# Patient Record
Sex: Female | Born: 1935 | Race: White | Hispanic: No | Smoking: Never smoker
Health system: Southern US, Community
[De-identification: ages and names within clinical notes are randomized; demographics above are authoritative.]

## PROBLEM LIST (undated history)

## (undated) DIAGNOSIS — D469 Myelodysplastic syndrome, unspecified: Secondary | ICD-10-CM

## (undated) DIAGNOSIS — C44529 Squamous cell carcinoma of skin of other part of trunk: Secondary | ICD-10-CM

## (undated) DIAGNOSIS — K623 Rectal prolapse: Secondary | ICD-10-CM

## (undated) DIAGNOSIS — I10 Essential (primary) hypertension: Secondary | ICD-10-CM

## (undated) DIAGNOSIS — D649 Anemia, unspecified: Secondary | ICD-10-CM

## (undated) DIAGNOSIS — R2681 Unsteadiness on feet: Secondary | ICD-10-CM

## (undated) DIAGNOSIS — M25519 Pain in unspecified shoulder: Secondary | ICD-10-CM

## (undated) DIAGNOSIS — E039 Hypothyroidism, unspecified: Secondary | ICD-10-CM

## (undated) DIAGNOSIS — B351 Tinea unguium: Secondary | ICD-10-CM

## (undated) DIAGNOSIS — C439 Malignant melanoma of skin, unspecified: Secondary | ICD-10-CM

## (undated) DIAGNOSIS — C4330 Malignant melanoma of unspecified part of face: Secondary | ICD-10-CM

## (undated) DIAGNOSIS — E785 Hyperlipidemia, unspecified: Secondary | ICD-10-CM

## (undated) DIAGNOSIS — F0390 Unspecified dementia without behavioral disturbance: Secondary | ICD-10-CM

## (undated) DIAGNOSIS — M1990 Unspecified osteoarthritis, unspecified site: Secondary | ICD-10-CM

## (undated) DIAGNOSIS — Z9981 Dependence on supplemental oxygen: Secondary | ICD-10-CM

## (undated) DIAGNOSIS — D225 Melanocytic nevi of trunk: Secondary | ICD-10-CM

## (undated) DIAGNOSIS — C44329 Squamous cell carcinoma of skin of other parts of face: Secondary | ICD-10-CM

## (undated) DIAGNOSIS — D0361 Melanoma in situ of right upper limb, including shoulder: Secondary | ICD-10-CM

## (undated) DIAGNOSIS — K635 Polyp of colon: Secondary | ICD-10-CM

## (undated) DIAGNOSIS — I639 Cerebral infarction, unspecified: Secondary | ICD-10-CM

## (undated) DIAGNOSIS — C44311 Basal cell carcinoma of skin of nose: Secondary | ICD-10-CM

## (undated) DIAGNOSIS — C4491 Basal cell carcinoma of skin, unspecified: Secondary | ICD-10-CM

## (undated) DIAGNOSIS — K5730 Diverticulosis of large intestine without perforation or abscess without bleeding: Secondary | ICD-10-CM

## (undated) DIAGNOSIS — Z5189 Encounter for other specified aftercare: Secondary | ICD-10-CM

## (undated) HISTORY — PX: APPENDECTOMY: SHX54

## (undated) HISTORY — DX: Hyperlipidemia, unspecified: E78.5

## (undated) HISTORY — PX: RECTOCELE REPAIR: SHX761

## (undated) HISTORY — DX: Hypothyroidism, unspecified: E03.9

## (undated) HISTORY — DX: Essential (primary) hypertension: I10

## (undated) HISTORY — DX: Tinea unguium: B35.1

## (undated) HISTORY — DX: Cerebral infarction, unspecified: I63.9

## (undated) HISTORY — DX: Unsteadiness on feet: R26.81

## (undated) HISTORY — PX: REPLACEMENT TOTAL KNEE: SUR1224

## (undated) HISTORY — DX: Polyp of colon: K63.5

## (undated) HISTORY — DX: Diverticulosis of large intestine without perforation or abscess without bleeding: K57.30

## (undated) HISTORY — DX: Encounter for other specified aftercare: Z51.89

## (undated) HISTORY — DX: Unspecified osteoarthritis, unspecified site: M19.90

## (undated) HISTORY — DX: Malignant melanoma of skin, unspecified: C43.9

## (undated) HISTORY — DX: Pain in unspecified shoulder: M25.519

## (undated) HISTORY — DX: Anemia, unspecified: D64.9

## (undated) HISTORY — DX: Myelodysplastic syndrome, unspecified: D46.9

## (undated) HISTORY — DX: Rectal prolapse: K62.3

## (undated) HISTORY — DX: Unspecified dementia, unspecified severity, without behavioral disturbance, psychotic disturbance, mood disturbance, and anxiety: F03.90

---
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
## 1984-12-16 DIAGNOSIS — C4330 Malignant melanoma of unspecified part of face: Secondary | ICD-10-CM

## 1984-12-16 HISTORY — DX: Malignant melanoma of unspecified part of face: C43.30

## 1987-12-17 HISTORY — PX: VAGINAL HYSTERECTOMY: SUR661

## 2002-12-16 DIAGNOSIS — C4491 Basal cell carcinoma of skin, unspecified: Secondary | ICD-10-CM

## 2002-12-16 HISTORY — PX: POLYPECTOMY: SHX149

## 2002-12-16 HISTORY — DX: Basal cell carcinoma of skin, unspecified: C44.91

## 2003-12-17 DIAGNOSIS — C44311 Basal cell carcinoma of skin of nose: Secondary | ICD-10-CM

## 2003-12-17 HISTORY — PX: MELANOMA EXCISION: SHX5266

## 2003-12-17 HISTORY — DX: Basal cell carcinoma of skin of nose: C44.311

## 2004-04-27 DIAGNOSIS — D0361 Melanoma in situ of right upper limb, including shoulder: Secondary | ICD-10-CM

## 2004-04-27 HISTORY — DX: Melanoma in situ of right upper limb, including shoulder: D03.61

## 2004-10-19 ENCOUNTER — Ambulatory Visit: Payer: Self-pay | Admitting: Cardiovascular Disease

## 2004-10-19 ENCOUNTER — Ambulatory Visit: Payer: Self-pay | Admitting: Internal Medicine

## 2004-10-22 ENCOUNTER — Ambulatory Visit: Payer: Self-pay | Admitting: Internal Medicine

## 2004-10-30 ENCOUNTER — Ambulatory Visit: Payer: Self-pay | Admitting: Internal Medicine

## 2004-11-02 ENCOUNTER — Ambulatory Visit: Payer: Self-pay | Admitting: Internal Medicine

## 2004-11-07 ENCOUNTER — Ambulatory Visit: Payer: Self-pay | Admitting: Internal Medicine

## 2004-12-16 DIAGNOSIS — Z5189 Encounter for other specified aftercare: Secondary | ICD-10-CM

## 2004-12-16 DIAGNOSIS — IMO0001 Reserved for inherently not codable concepts without codable children: Secondary | ICD-10-CM

## 2004-12-16 HISTORY — DX: Encounter for other specified aftercare: Z51.89

## 2004-12-16 HISTORY — PX: CARPAL TUNNEL RELEASE: SHX101

## 2004-12-16 HISTORY — DX: Reserved for inherently not codable concepts without codable children: IMO0001

## 2004-12-21 ENCOUNTER — Other Ambulatory Visit: Admission: RE | Admit: 2004-12-21 | Discharge: 2004-12-21 | Payer: Self-pay | Admitting: Gynecology

## 2004-12-28 ENCOUNTER — Ambulatory Visit: Payer: Self-pay | Admitting: Internal Medicine

## 2005-01-18 ENCOUNTER — Ambulatory Visit: Payer: Self-pay | Admitting: Internal Medicine

## 2005-01-29 ENCOUNTER — Encounter: Admission: RE | Admit: 2005-01-29 | Discharge: 2005-04-29 | Payer: Self-pay | Admitting: Internal Medicine

## 2005-02-15 ENCOUNTER — Ambulatory Visit: Payer: Self-pay | Admitting: Internal Medicine

## 2005-04-23 ENCOUNTER — Ambulatory Visit: Payer: Self-pay | Admitting: Internal Medicine

## 2005-05-07 ENCOUNTER — Ambulatory Visit: Payer: Self-pay | Admitting: Internal Medicine

## 2005-07-03 ENCOUNTER — Ambulatory Visit: Payer: Self-pay | Admitting: Internal Medicine

## 2005-07-10 ENCOUNTER — Ambulatory Visit: Payer: Self-pay | Admitting: Internal Medicine

## 2005-07-30 DIAGNOSIS — C44329 Squamous cell carcinoma of skin of other parts of face: Secondary | ICD-10-CM

## 2005-07-30 HISTORY — DX: Squamous cell carcinoma of skin of other parts of face: C44.329

## 2005-08-21 ENCOUNTER — Ambulatory Visit: Payer: Self-pay | Admitting: Internal Medicine

## 2005-08-26 ENCOUNTER — Ambulatory Visit: Payer: Self-pay | Admitting: Family Medicine

## 2006-01-02 ENCOUNTER — Ambulatory Visit: Payer: Self-pay | Admitting: Internal Medicine

## 2006-01-03 ENCOUNTER — Other Ambulatory Visit: Admission: RE | Admit: 2006-01-03 | Discharge: 2006-01-03 | Payer: Self-pay | Admitting: Gynecology

## 2006-01-06 ENCOUNTER — Ambulatory Visit: Payer: Self-pay | Admitting: Internal Medicine

## 2006-07-04 ENCOUNTER — Ambulatory Visit: Payer: Self-pay | Admitting: Internal Medicine

## 2006-12-24 ENCOUNTER — Ambulatory Visit: Payer: Self-pay | Admitting: Internal Medicine

## 2006-12-24 LAB — CONVERTED CEMR LAB
Basophils Absolute: 0 10*3/uL (ref 0.0–0.1)
Basophils Relative: 0.5 % (ref 0.0–1.0)
Eosinophil percent: 1.8 % (ref 0.0–5.0)
Ferritin: 311.8 ng/mL — ABNORMAL HIGH (ref 10.0–291.0)
HCT: 30.2 % — ABNORMAL LOW (ref 36.0–46.0)
Hemoglobin: 10.1 g/dL — ABNORMAL LOW (ref 12.0–15.0)
Hgb A1c MFr Bld: 5.2 % (ref 4.6–6.0)
Iron: 97 ug/dL (ref 42–145)
Lymphocytes Relative: 18 % (ref 12.0–46.0)
MCHC: 33.3 g/dL (ref 30.0–36.0)
MCV: 93.1 fL (ref 78.0–100.0)
Monocytes Absolute: 0.8 10*3/uL — ABNORMAL HIGH (ref 0.2–0.7)
Monocytes Relative: 9.9 % (ref 3.0–11.0)
Neutro Abs: 5.4 10*3/uL (ref 1.4–7.7)
Neutrophils Relative %: 69.8 % (ref 43.0–77.0)
Platelets: 199 10*3/uL (ref 150–400)
RBC: 3.25 M/uL — ABNORMAL LOW (ref 3.87–5.11)
RDW: 13.3 % (ref 11.5–14.6)
Saturation Ratios: 26 % (ref 20.0–50.0)
TSH: 0.63 microintl units/mL (ref 0.35–5.50)
Transferrin: 266.4 mg/dL (ref >=212.0)
WBC: 7.7 10*3/uL (ref 4.5–10.5)

## 2006-12-31 ENCOUNTER — Ambulatory Visit: Payer: Self-pay | Admitting: Oncology

## 2006-12-31 ENCOUNTER — Ambulatory Visit: Payer: Self-pay | Admitting: Internal Medicine

## 2006-12-31 LAB — CONVERTED CEMR LAB
Basophils Absolute: 0 10*3/uL (ref 0.0–0.1)
Basophils Relative: 0.6 % (ref 0.0–1.0)
Eosinophils Relative: 1 % (ref 0.0–5.0)
Ferritin: 273.6 ng/mL (ref 10.0–291.0)
Folate: >20 ng/mL
HCT: 30 % — ABNORMAL LOW (ref 36.0–46.0)
Hemoglobin: 10 g/dL — ABNORMAL LOW (ref 12.0–15.0)
Lymphocytes Relative: 17.9 % (ref 12.0–46.0)
MCHC: 33.4 g/dL (ref 30.0–36.0)
MCV: 93.4 fL (ref 78.0–100.0)
Monocytes Absolute: 0.7 10*3/uL (ref 0.2–0.7)
Monocytes Relative: 8 % (ref 3.0–11.0)
Neutro Abs: 6 10*3/uL (ref 1.4–7.7)
Neutrophils Relative %: 72.5 % (ref 43.0–77.0)
Platelets: 225 10*3/uL (ref 150–400)
RBC: 3.21 M/uL — ABNORMAL LOW (ref 3.87–5.11)
RDW: 13.5 % (ref 11.5–14.6)
Vitamin B-12: 779 pg/mL (ref 211–911)
WBC: 8.3 10*3/uL (ref 4.5–10.5)

## 2007-01-13 ENCOUNTER — Other Ambulatory Visit: Admission: RE | Admit: 2007-01-13 | Discharge: 2007-01-13 | Payer: Self-pay | Admitting: Gynecology

## 2007-02-04 ENCOUNTER — Ambulatory Visit: Payer: Self-pay | Admitting: Internal Medicine

## 2007-02-11 LAB — CBC & DIFF AND RETIC
BASO%: 0.4 % (ref 0.0–2.0)
EOS%: 1 % (ref 0.0–7.0)
HCT: 33.7 % — ABNORMAL LOW (ref 34.8–46.6)
IRF: 0.35 — ABNORMAL HIGH (ref 0.130–0.330)
MCH: 31.6 pg (ref 26.0–34.0)
MCHC: 34.8 g/dL (ref 32.0–36.0)
NEUT%: 63.4 % (ref 39.6–76.8)
RDW: 14.4 % (ref 11.3–14.5)
lymph#: 1.9 10*3/uL (ref 0.9–3.3)

## 2007-02-11 LAB — URINALYSIS, MICROSCOPIC - CHCC
Bilirubin (Urine): NEGATIVE
Glucose: NEGATIVE g/dL
Ketones: NEGATIVE mg/dL

## 2007-02-11 LAB — MORPHOLOGY: PLT EST: ADEQUATE

## 2007-02-16 LAB — FERRITIN: Ferritin: 247 ng/mL (ref 10–291)

## 2007-02-16 LAB — COMPREHENSIVE METABOLIC PANEL
ALT: 22 U/L (ref 0–35)
Alkaline Phosphatase: 73 U/L (ref 39–117)
Potassium: 4.3 mEq/L (ref 3.5–5.3)
Sodium: 139 mEq/L (ref 135–145)
Total Bilirubin: 0.6 mg/dL (ref 0.3–1.2)
Total Protein: 7.1 g/dL (ref 6.0–8.3)

## 2007-02-16 LAB — FOLATE: Folate: >20 ng/mL

## 2007-02-16 LAB — IMMUNOFIXATION ELECTROPHORESIS
IgG (Immunoglobin G), Serum: 749 mg/dL (ref 694–1618)
Total Protein, Serum Electrophoresis: 7.1 g/dL (ref 6.0–8.3)

## 2007-02-16 LAB — VITAMIN B12: Vitamin B-12: 902 pg/mL (ref 211–911)

## 2007-02-16 LAB — IRON AND TIBC: %SAT: 32 % (ref 20–55)

## 2007-02-23 ENCOUNTER — Ambulatory Visit: Payer: Self-pay | Admitting: Internal Medicine

## 2007-03-25 ENCOUNTER — Ambulatory Visit: Payer: Self-pay | Admitting: Internal Medicine

## 2007-04-07 ENCOUNTER — Ambulatory Visit: Payer: Self-pay | Admitting: Gastroenterology

## 2007-04-09 ENCOUNTER — Ambulatory Visit: Payer: Self-pay | Admitting: Oncology

## 2007-04-14 LAB — CBC WITH DIFFERENTIAL/PLATELET
Eosinophils Absolute: 0.1 10*3/uL (ref 0.0–0.5)
HCT: 28.9 % — ABNORMAL LOW (ref 34.8–46.6)
LYMPH%: 32.8 % (ref 14.0–48.0)
MONO#: 0.6 10*3/uL (ref 0.1–0.9)
NEUT#: 3.5 10*3/uL (ref 1.5–6.5)
Platelets: 167 10*3/uL (ref 145–400)
RBC: 3.21 10*6/uL — ABNORMAL LOW (ref 3.70–5.32)
WBC: 6.3 10*3/uL (ref 3.9–10.0)

## 2007-04-23 ENCOUNTER — Ambulatory Visit: Payer: Self-pay | Admitting: Oncology

## 2007-04-23 ENCOUNTER — Encounter: Payer: Self-pay | Admitting: Oncology

## 2007-04-23 ENCOUNTER — Other Ambulatory Visit: Admission: RE | Admit: 2007-04-23 | Discharge: 2007-04-23 | Payer: Self-pay | Admitting: Oncology

## 2007-04-23 LAB — CBC WITH DIFFERENTIAL/PLATELET
BASO%: 0.5 % (ref 0.0–2.0)
Basophils Absolute: 0 10*3/uL (ref 0.0–0.1)
Eosinophils Absolute: 0.1 10*3/uL (ref 0.0–0.5)
HCT: 27.4 % — ABNORMAL LOW (ref 34.8–46.6)
HGB: 9.7 g/dL — ABNORMAL LOW (ref 11.6–15.9)
LYMPH%: 32.1 % (ref 14.0–48.0)
MCHC: 35.5 g/dL (ref 32.0–36.0)
MONO#: 0.4 10*3/uL (ref 0.1–0.9)
NEUT%: 57.8 % (ref 39.6–76.8)
Platelets: 162 10*3/uL (ref 145–400)
WBC: 4.7 10*3/uL (ref 3.9–10.0)

## 2007-04-23 LAB — MORPHOLOGY: PLT EST: ADEQUATE

## 2007-04-23 LAB — CHCC SMEAR

## 2007-05-18 ENCOUNTER — Ambulatory Visit: Payer: Self-pay | Admitting: Oncology

## 2007-05-18 LAB — CBC WITH DIFFERENTIAL/PLATELET
Basophils Absolute: 0 10*3/uL (ref 0.0–0.1)
Eosinophils Absolute: 0 10*3/uL (ref 0.0–0.5)
HGB: 9.9 g/dL — ABNORMAL LOW (ref 11.6–15.9)
NEUT#: 2.6 10*3/uL (ref 1.5–6.5)
RBC: 3.08 10*6/uL — ABNORMAL LOW (ref 3.70–5.32)
RDW: 14.8 % — ABNORMAL HIGH (ref 11.3–14.5)
WBC: 4.4 10*3/uL (ref 3.9–10.0)
lymph#: 1.4 10*3/uL (ref 0.9–3.3)

## 2007-06-09 ENCOUNTER — Ambulatory Visit (HOSPITAL_COMMUNITY): Admission: RE | Admit: 2007-06-09 | Discharge: 2007-06-10 | Payer: Self-pay | Admitting: Gynecology

## 2007-06-26 ENCOUNTER — Ambulatory Visit: Payer: Self-pay | Admitting: Internal Medicine

## 2007-06-26 LAB — CONVERTED CEMR LAB
ALT: 17 units/L (ref 0–35)
AST: 18 units/L (ref 0–37)
Albumin: 4.2 g/dL (ref 3.5–5.2)
Alkaline Phosphatase: 62 units/L (ref 39–117)
BUN: 31 mg/dL — ABNORMAL HIGH (ref 6–23)
Bilirubin, Direct: 0.1 mg/dL (ref 0.0–0.3)
CO2: 26 meq/L (ref 19–32)
Calcium: 9.5 mg/dL (ref 8.4–10.5)
Chloride: 103 meq/L (ref 96–112)
Cholesterol: 198 mg/dL (ref 0–200)
Creatinine, Ser: 1.2 mg/dL (ref 0.4–1.2)
GFR calc Af Amer: 55 mL/min
GFR calc non Af Amer: 46 mL/min
Glucose, Bld: 131 mg/dL — ABNORMAL HIGH (ref 70–99)
HDL: 49.9 mg/dL (ref >39.0)
Hgb A1c MFr Bld: 5 % (ref 4.6–6.0)
LDL Cholesterol: 110 mg/dL — ABNORMAL HIGH (ref 0–99)
Potassium: 4.7 meq/L (ref 3.5–5.1)
Sodium: 138 meq/L (ref 135–145)
Total Bilirubin: 0.9 mg/dL (ref 0.3–1.2)
Total CHOL/HDL Ratio: 4
Total Protein: 6.6 g/dL (ref 6.0–8.3)
Triglycerides: 190 mg/dL — ABNORMAL HIGH (ref 0–149)
VLDL: 38 mg/dL (ref 0–40)

## 2007-07-16 ENCOUNTER — Ambulatory Visit: Payer: Self-pay | Admitting: Oncology

## 2007-07-20 LAB — CBC & DIFF AND RETIC
Basophils Absolute: 0 10*3/uL (ref 0.0–0.1)
Eosinophils Absolute: 0.1 10*3/uL (ref 0.0–0.5)
HCT: 25.8 % — ABNORMAL LOW (ref 34.8–46.6)
HGB: 9.2 g/dL — ABNORMAL LOW (ref 11.6–15.9)
IRF: 0.32 (ref 0.130–0.330)
MONO#: 0.4 10*3/uL (ref 0.1–0.9)
NEUT#: 4.4 10*3/uL (ref 1.5–6.5)
RDW: 14.6 % — ABNORMAL HIGH (ref 11.3–14.5)
RETIC #: 42 10*3/uL (ref 19.7–115.1)
lymph#: 1.5 10*3/uL (ref 0.9–3.3)

## 2007-07-20 LAB — MORPHOLOGY: PLT EST: ADEQUATE

## 2007-07-23 ENCOUNTER — Telehealth (INDEPENDENT_AMBULATORY_CARE_PROVIDER_SITE_OTHER): Payer: Self-pay | Admitting: *Deleted

## 2007-08-21 DIAGNOSIS — M810 Age-related osteoporosis without current pathological fracture: Secondary | ICD-10-CM | POA: Insufficient documentation

## 2007-08-21 DIAGNOSIS — K5730 Diverticulosis of large intestine without perforation or abscess without bleeding: Secondary | ICD-10-CM | POA: Insufficient documentation

## 2007-08-21 DIAGNOSIS — E785 Hyperlipidemia, unspecified: Secondary | ICD-10-CM | POA: Insufficient documentation

## 2007-08-21 DIAGNOSIS — I10 Essential (primary) hypertension: Secondary | ICD-10-CM | POA: Insufficient documentation

## 2007-08-21 DIAGNOSIS — E119 Type 2 diabetes mellitus without complications: Secondary | ICD-10-CM | POA: Insufficient documentation

## 2007-08-21 DIAGNOSIS — E039 Hypothyroidism, unspecified: Secondary | ICD-10-CM | POA: Insufficient documentation

## 2007-08-21 DIAGNOSIS — Z85828 Personal history of other malignant neoplasm of skin: Secondary | ICD-10-CM | POA: Insufficient documentation

## 2007-09-08 ENCOUNTER — Ambulatory Visit: Payer: Self-pay | Admitting: Internal Medicine

## 2007-09-16 ENCOUNTER — Ambulatory Visit: Payer: Self-pay | Admitting: Oncology

## 2007-09-16 ENCOUNTER — Encounter: Payer: Self-pay | Admitting: Internal Medicine

## 2007-09-21 ENCOUNTER — Encounter: Payer: Self-pay | Admitting: Internal Medicine

## 2007-09-21 LAB — CBC & DIFF AND RETIC
Basophils Absolute: 0 10*3/uL (ref 0.0–0.1)
EOS%: 1.8 % (ref 0.0–7.0)
Eosinophils Absolute: 0.1 10*3/uL (ref 0.0–0.5)
HGB: 9.4 g/dL — ABNORMAL LOW (ref 11.6–15.9)
IRF: 0.25 (ref 0.130–0.330)
MCH: 32.6 pg (ref 26.0–34.0)
MONO#: 0.4 10*3/uL (ref 0.1–0.9)
NEUT#: 3.1 10*3/uL (ref 1.5–6.5)
RDW: 14.7 % — ABNORMAL HIGH (ref 11.3–14.5)
RETIC #: 50.2 10*3/uL (ref 19.7–115.1)
WBC: 5 10*3/uL (ref 3.9–10.0)
lymph#: 1.4 10*3/uL (ref 0.9–3.3)

## 2007-09-21 LAB — MORPHOLOGY: PLT EST: ADEQUATE

## 2007-11-19 ENCOUNTER — Ambulatory Visit: Payer: Self-pay | Admitting: Oncology

## 2007-11-23 LAB — CBC & DIFF AND RETIC
Basophils Absolute: 0 10*3/uL (ref 0.0–0.1)
Eosinophils Absolute: 0.1 10*3/uL (ref 0.0–0.5)
HCT: 26.2 % — ABNORMAL LOW (ref 34.8–46.6)
HGB: 9.3 g/dL — ABNORMAL LOW (ref 11.6–15.9)
LYMPH%: 25.7 % (ref 14.0–48.0)
MONO#: 0.4 10*3/uL (ref 0.1–0.9)
NEUT#: 4.2 10*3/uL (ref 1.5–6.5)
NEUT%: 66.7 % (ref 39.6–76.8)
Platelets: 166 10*3/uL (ref 145–400)
WBC: 6.4 10*3/uL (ref 3.9–10.0)

## 2007-11-23 LAB — MORPHOLOGY: PLT EST: ADEQUATE

## 2007-12-22 ENCOUNTER — Ambulatory Visit: Payer: Self-pay | Admitting: Internal Medicine

## 2007-12-23 LAB — CONVERTED CEMR LAB
BUN: 45 mg/dL — ABNORMAL HIGH (ref 6–23)
CO2: 23 meq/L (ref 19–32)
Calcium: 9.4 mg/dL (ref 8.4–10.5)
Chloride: 107 meq/L (ref 96–112)
Cholesterol: 176 mg/dL (ref 0–200)
Creatinine, Ser: 1.1 mg/dL (ref 0.4–1.2)
GFR calc Af Amer: 61 mL/min
GFR calc non Af Amer: 51 mL/min
Glucose, Bld: 92 mg/dL (ref 70–99)
HDL: 53.1 mg/dL (ref >39.0)
Hgb A1c MFr Bld: 5.9 % (ref 4.6–6.0)
LDL Cholesterol: 91 mg/dL (ref 0–99)
Potassium: 5.2 meq/L — ABNORMAL HIGH (ref 3.5–5.1)
Sodium: 136 meq/L (ref 135–145)
TSH: 0.17 microintl units/mL — ABNORMAL LOW (ref 0.35–5.50)
Total CHOL/HDL Ratio: 3.3
Triglycerides: 162 mg/dL — ABNORMAL HIGH (ref 0–149)
VLDL: 32 mg/dL (ref 0–40)

## 2007-12-24 LAB — CONVERTED CEMR LAB: Vit D, 1,25-Dihydroxy: 34 (ref 30–89)

## 2008-01-08 ENCOUNTER — Telehealth: Payer: Self-pay | Admitting: Internal Medicine

## 2008-01-11 ENCOUNTER — Encounter: Payer: Self-pay | Admitting: Internal Medicine

## 2008-01-11 ENCOUNTER — Telehealth: Payer: Self-pay | Admitting: Internal Medicine

## 2008-01-17 LAB — HM MAMMOGRAPHY

## 2008-01-20 ENCOUNTER — Telehealth (INDEPENDENT_AMBULATORY_CARE_PROVIDER_SITE_OTHER): Payer: Self-pay | Admitting: *Deleted

## 2008-01-20 ENCOUNTER — Other Ambulatory Visit: Admission: RE | Admit: 2008-01-20 | Discharge: 2008-01-20 | Payer: Self-pay | Admitting: Gynecology

## 2008-01-21 ENCOUNTER — Ambulatory Visit: Payer: Self-pay | Admitting: Oncology

## 2008-01-25 ENCOUNTER — Encounter: Payer: Self-pay | Admitting: Internal Medicine

## 2008-01-25 ENCOUNTER — Encounter (HOSPITAL_COMMUNITY): Admission: RE | Admit: 2008-01-25 | Discharge: 2008-04-24 | Payer: Self-pay | Admitting: Oncology

## 2008-01-25 LAB — CBC WITH DIFFERENTIAL/PLATELET
Basophils Absolute: 0 10*3/uL (ref 0.0–0.1)
Eosinophils Absolute: 0.1 10*3/uL (ref 0.0–0.5)
HCT: 20.3 % — ABNORMAL LOW (ref 34.8–46.6)
HGB: 7.1 g/dL — ABNORMAL LOW (ref 11.6–15.9)
LYMPH%: 11.1 % — ABNORMAL LOW (ref 14.0–48.0)
MCV: 94.7 fL (ref 81.0–101.0)
MONO#: 0.5 10*3/uL (ref 0.1–0.9)
MONO%: 5.3 % (ref 0.0–13.0)
NEUT#: 8.1 10*3/uL — ABNORMAL HIGH (ref 1.5–6.5)
Platelets: 228 10*3/uL (ref 145–400)
WBC: 9.9 10*3/uL (ref 3.9–10.0)

## 2008-01-25 LAB — COMPREHENSIVE METABOLIC PANEL
AST: 19 U/L (ref 0–37)
Albumin: 3.9 g/dL (ref 3.5–5.2)
Alkaline Phosphatase: 47 U/L (ref 39–117)
BUN: 49 mg/dL — ABNORMAL HIGH (ref 6–23)
Glucose, Bld: 117 mg/dL — ABNORMAL HIGH (ref 70–99)
Potassium: 5.2 mEq/L (ref 3.5–5.3)
Sodium: 139 mEq/L (ref 135–145)
Total Bilirubin: 0.7 mg/dL (ref 0.3–1.2)
Total Protein: 6.5 g/dL (ref 6.0–8.3)

## 2008-01-25 LAB — RETICULOCYTES
RETIC #: 108.9 10*3/uL (ref 19.7–115.1)
Retic %: 5 % — ABNORMAL HIGH (ref 0.4–2.3)

## 2008-01-25 LAB — MORPHOLOGY: PLT EST: ADEQUATE

## 2008-01-26 LAB — TYPE & CROSSMATCH - CHCC

## 2008-01-28 LAB — TYPE & CROSSMATCH - CHCC

## 2008-02-22 LAB — CBC & DIFF AND RETIC
Basophils Absolute: 0 10*3/uL (ref 0.0–0.1)
Eosinophils Absolute: 0 10*3/uL (ref 0.0–0.5)
HCT: 25.2 % — ABNORMAL LOW (ref 34.8–46.6)
LYMPH%: 24 % (ref 14.0–48.0)
MCV: 95 fL (ref 81.0–101.0)
MONO#: 0.6 10*3/uL (ref 0.1–0.9)
MONO%: 8.7 % (ref 0.0–13.0)
NEUT#: 4.7 10*3/uL (ref 1.5–6.5)
NEUT%: 66.5 % (ref 39.6–76.8)
Platelets: 199 10*3/uL (ref 145–400)
RBC: 2.65 10*6/uL — ABNORMAL LOW (ref 3.70–5.32)

## 2008-02-22 LAB — MORPHOLOGY: PLT EST: ADEQUATE

## 2008-03-17 ENCOUNTER — Ambulatory Visit: Payer: Self-pay | Admitting: Oncology

## 2008-03-21 LAB — CBC & DIFF AND RETIC
BASO%: 0.3 % (ref 0.0–2.0)
EOS%: 0.9 % (ref 0.0–7.0)
MCH: 34 pg (ref 26.0–34.0)
MCHC: 35.2 g/dL (ref 32.0–36.0)
MCV: 96.9 fL (ref 81.0–101.0)
MONO%: 6.6 % (ref 0.0–13.0)
NEUT#: 4.2 10*3/uL (ref 1.5–6.5)
RBC: 2.67 10*6/uL — ABNORMAL LOW (ref 3.70–5.32)
RDW: 15.3 % — ABNORMAL HIGH (ref 11.3–14.5)
RETIC #: 76.9 10*3/uL (ref 19.7–115.1)
Retic %: 2.9 % — ABNORMAL HIGH (ref 0.4–2.3)

## 2008-03-21 LAB — MORPHOLOGY

## 2008-04-18 DIAGNOSIS — C439 Malignant melanoma of skin, unspecified: Secondary | ICD-10-CM

## 2008-04-18 HISTORY — DX: Malignant melanoma of skin, unspecified: C43.9

## 2008-04-18 LAB — CBC & DIFF AND RETIC
Basophils Absolute: 0 10*3/uL (ref 0.0–0.1)
Eosinophils Absolute: 0 10*3/uL (ref 0.0–0.5)
HGB: 9.2 g/dL — ABNORMAL LOW (ref 11.6–15.9)
IRF: 0.28 (ref 0.130–0.330)
MCV: 95.1 fL (ref 81.0–101.0)
MONO#: 0.4 10*3/uL (ref 0.1–0.9)
MONO%: 6.9 % (ref 0.0–13.0)
NEUT#: 4 10*3/uL (ref 1.5–6.5)
RDW: 14.5 % (ref 11.3–14.5)
RETIC #: 64.2 10*3/uL (ref 19.7–115.1)

## 2008-04-18 LAB — MORPHOLOGY

## 2008-05-20 ENCOUNTER — Ambulatory Visit: Payer: Self-pay | Admitting: Oncology

## 2008-05-24 ENCOUNTER — Encounter: Payer: Self-pay | Admitting: Internal Medicine

## 2008-05-24 LAB — CBC & DIFF AND RETIC
BASO%: 0.5 % (ref 0.0–2.0)
EOS%: 1.2 % (ref 0.0–7.0)
HGB: 9.6 g/dL — ABNORMAL LOW (ref 11.6–15.9)
IRF: 0.28 (ref 0.130–0.330)
MCH: 32.1 pg (ref 26.0–34.0)
MCHC: 34.9 g/dL (ref 32.0–36.0)
MCV: 91.9 fL (ref 81.0–101.0)
MONO%: 9.5 % (ref 0.0–13.0)
RBC: 2.98 10*6/uL — ABNORMAL LOW (ref 3.70–5.32)
RDW: 14.3 % (ref 11.3–14.5)
RETIC #: 67.3 10*3/uL (ref 19.7–115.1)
Retic %: 2.3 % (ref 0.4–2.3)
lymph#: 1.8 10*3/uL (ref 0.9–3.3)

## 2008-05-24 LAB — MORPHOLOGY: PLT EST: ADEQUATE

## 2008-05-24 LAB — COMPREHENSIVE METABOLIC PANEL
ALT: 19 U/L (ref 0–35)
AST: 17 U/L (ref 0–37)
BUN: 37 mg/dL — ABNORMAL HIGH (ref 6–23)
Creatinine, Ser: 1.11 mg/dL (ref 0.40–1.20)
Total Bilirubin: 0.6 mg/dL (ref 0.3–1.2)

## 2008-06-14 ENCOUNTER — Ambulatory Visit: Payer: Self-pay | Admitting: Internal Medicine

## 2008-06-15 LAB — CONVERTED CEMR LAB
ALT: 27 units/L (ref 0–35)
AST: 21 units/L (ref 0–37)
BUN: 31 mg/dL — ABNORMAL HIGH (ref 6–23)
CO2: 28 meq/L (ref 19–32)
Calcium: 9.9 mg/dL (ref 8.4–10.5)
Chloride: 108 meq/L (ref 96–112)
Cholesterol: 180 mg/dL (ref 0–200)
Creatinine, Ser: 1 mg/dL (ref 0.4–1.2)
Direct LDL: 82.3 mg/dL
GFR calc Af Amer: 68 mL/min
GFR calc non Af Amer: 56 mL/min
Glucose, Bld: 106 mg/dL — ABNORMAL HIGH (ref 70–99)
HDL: 53.6 mg/dL (ref >39.0)
Hgb A1c MFr Bld: 5.3 % (ref 4.6–6.0)
Potassium: 5.2 meq/L — ABNORMAL HIGH (ref 3.5–5.1)
Sodium: 142 meq/L (ref 135–145)
TSH: 0.52 microintl units/mL (ref 0.35–5.50)
Total CHOL/HDL Ratio: 3.4
Triglycerides: 212 mg/dL (ref 0–149)
VLDL: 42 mg/dL — ABNORMAL HIGH (ref 0–40)

## 2008-07-07 ENCOUNTER — Ambulatory Visit: Payer: Self-pay | Admitting: Surgery

## 2008-07-07 ENCOUNTER — Ambulatory Visit (HOSPITAL_COMMUNITY): Admission: RE | Admit: 2008-07-07 | Discharge: 2008-07-07 | Payer: Self-pay | Admitting: Family Medicine

## 2008-07-07 ENCOUNTER — Encounter: Payer: Self-pay | Admitting: Family Medicine

## 2008-07-07 ENCOUNTER — Encounter: Payer: Self-pay | Admitting: Internal Medicine

## 2008-08-17 ENCOUNTER — Ambulatory Visit: Payer: Self-pay | Admitting: Oncology

## 2008-08-23 ENCOUNTER — Encounter: Payer: Self-pay | Admitting: Internal Medicine

## 2008-08-23 LAB — CBC WITH DIFFERENTIAL/PLATELET
Eosinophils Absolute: 0 10*3/uL (ref 0.0–0.5)
HCT: 26.9 % — ABNORMAL LOW (ref 34.8–46.6)
LYMPH%: 29.1 % (ref 14.0–48.0)
MCHC: 34.3 g/dL (ref 32.0–36.0)
MCV: 91.9 fL (ref 81.0–101.0)
MONO%: 8.8 % (ref 0.0–13.0)
NEUT#: 3.2 10*3/uL (ref 1.5–6.5)
NEUT%: 60.8 % (ref 39.6–76.8)
Platelets: 159 10*3/uL (ref 145–400)
RBC: 2.92 10*6/uL — ABNORMAL LOW (ref 3.70–5.32)

## 2008-08-23 LAB — MORPHOLOGY

## 2008-10-12 ENCOUNTER — Ambulatory Visit: Payer: Self-pay | Admitting: Internal Medicine

## 2008-11-21 ENCOUNTER — Ambulatory Visit: Payer: Self-pay | Admitting: Oncology

## 2008-11-23 ENCOUNTER — Encounter: Payer: Self-pay | Admitting: Internal Medicine

## 2008-11-23 LAB — CBC WITH DIFFERENTIAL/PLATELET
BASO%: 0.3 % (ref 0.0–2.0)
EOS%: 0.3 % (ref 0.0–7.0)
HCT: 24.9 % — ABNORMAL LOW (ref 34.8–46.6)
LYMPH%: 19.3 % (ref 14.0–48.0)
MCH: 31.5 pg (ref 26.0–34.0)
MCHC: 34.8 g/dL (ref 32.0–36.0)
MCV: 90.6 fL (ref 81.0–101.0)
NEUT%: 69.8 % (ref 39.6–76.8)
Platelets: 165 10*3/uL (ref 145–400)

## 2009-01-02 ENCOUNTER — Ambulatory Visit: Payer: Self-pay | Admitting: Internal Medicine

## 2009-01-02 DIAGNOSIS — D469 Myelodysplastic syndrome, unspecified: Secondary | ICD-10-CM | POA: Insufficient documentation

## 2009-01-02 LAB — CONVERTED CEMR LAB: Vit D, 1,25-Dihydroxy: 31 (ref 30–89)

## 2009-01-04 ENCOUNTER — Telehealth: Payer: Self-pay | Admitting: Internal Medicine

## 2009-01-05 LAB — CONVERTED CEMR LAB
ALT: 20 units/L (ref 0–35)
AST: 20 units/L (ref 0–37)
Albumin: 3.9 g/dL (ref 3.5–5.2)
Alkaline Phosphatase: 68 units/L (ref 39–117)
BUN: 28 mg/dL — ABNORMAL HIGH (ref 6–23)
Basophils Absolute: 0 10*3/uL (ref 0.0–0.1)
Basophils Relative: 0.3 % (ref 0.0–3.0)
Bilirubin, Direct: 0.1 mg/dL (ref 0.0–0.3)
CO2: 28 meq/L (ref 19–32)
Calcium: 9.7 mg/dL (ref 8.4–10.5)
Chloride: 107 meq/L (ref 96–112)
Cholesterol: 165 mg/dL (ref 0–200)
Creatinine, Ser: 1 mg/dL (ref 0.4–1.2)
Eosinophils Absolute: 0 10*3/uL (ref 0.0–0.7)
Eosinophils Relative: 0.7 % (ref 0.0–5.0)
GFR calc Af Amer: 68 mL/min
GFR calc non Af Amer: 56 mL/min
Glucose, Bld: 104 mg/dL — ABNORMAL HIGH (ref 70–99)
HCT: 26.4 % — ABNORMAL LOW (ref 36.0–46.0)
HDL: 53.5 mg/dL (ref >39.0)
Hemoglobin: 9.1 g/dL — ABNORMAL LOW (ref 12.0–15.0)
Hgb A1c MFr Bld: 5.1 % (ref 4.6–6.0)
LDL Cholesterol: 85 mg/dL (ref 0–99)
Lymphocytes Relative: 20.3 % (ref 12.0–46.0)
MCHC: 34.3 g/dL (ref 30.0–36.0)
MCV: 92 fL (ref 78.0–100.0)
Monocytes Absolute: 0.5 10*3/uL (ref 0.1–1.0)
Monocytes Relative: 8.7 % (ref 3.0–12.0)
Neutro Abs: 4.4 10*3/uL (ref 1.4–7.7)
Neutrophils Relative %: 70 % (ref 43.0–77.0)
Platelets: 179 10*3/uL (ref 150–400)
Potassium: 4.6 meq/L (ref 3.5–5.1)
RBC: 2.88 M/uL — ABNORMAL LOW (ref 3.87–5.11)
RDW: 14.6 % (ref 11.5–14.6)
Sodium: 142 meq/L (ref 135–145)
TSH: 0.65 microintl units/mL (ref 0.35–5.50)
Total Bilirubin: 0.8 mg/dL (ref 0.3–1.2)
Total CHOL/HDL Ratio: 3.1
Total Protein: 6.6 g/dL (ref 6.0–8.3)
Triglycerides: 132 mg/dL (ref 0–149)
VLDL: 26 mg/dL (ref 0–40)
WBC: 6.1 10*3/uL (ref 4.5–10.5)

## 2009-01-12 ENCOUNTER — Ambulatory Visit: Payer: Self-pay | Admitting: Internal Medicine

## 2009-01-16 ENCOUNTER — Ambulatory Visit: Payer: Self-pay | Admitting: Oncology

## 2009-01-18 LAB — CBC WITH DIFFERENTIAL/PLATELET
BASO%: 0.5 % (ref 0.0–2.0)
EOS%: 0.9 % (ref 0.0–7.0)
HCT: 27.1 % — ABNORMAL LOW (ref 34.8–46.6)
LYMPH%: 29.7 % (ref 14.0–48.0)
MCH: 31.7 pg (ref 26.0–34.0)
MCHC: 34.8 g/dL (ref 32.0–36.0)
MONO%: 8 % (ref 0.0–13.0)
NEUT%: 60.9 % (ref 39.6–76.8)
Platelets: 165 10*3/uL (ref 145–400)
lymph#: 1.7 10*3/uL (ref 0.9–3.3)

## 2009-01-30 ENCOUNTER — Telehealth: Payer: Self-pay | Admitting: Internal Medicine

## 2009-02-03 ENCOUNTER — Ambulatory Visit: Payer: Self-pay | Admitting: Gynecology

## 2009-02-03 ENCOUNTER — Encounter: Payer: Self-pay | Admitting: Gynecology

## 2009-02-03 ENCOUNTER — Other Ambulatory Visit: Admission: RE | Admit: 2009-02-03 | Discharge: 2009-02-03 | Payer: Self-pay | Admitting: Gynecology

## 2009-02-06 ENCOUNTER — Telehealth: Payer: Self-pay | Admitting: Internal Medicine

## 2009-02-10 ENCOUNTER — Encounter: Payer: Self-pay | Admitting: Internal Medicine

## 2009-03-07 ENCOUNTER — Encounter: Payer: Self-pay | Admitting: Internal Medicine

## 2009-03-15 ENCOUNTER — Ambulatory Visit: Payer: Self-pay | Admitting: Oncology

## 2009-03-20 ENCOUNTER — Encounter: Payer: Self-pay | Admitting: Internal Medicine

## 2009-03-20 LAB — CBC WITH DIFFERENTIAL/PLATELET
BASO%: 0.3 % (ref 0.0–2.0)
LYMPH%: 20.9 % (ref 14.0–49.7)
MCHC: 34.2 g/dL (ref 31.5–36.0)
MONO#: 0.6 10*3/uL (ref 0.1–0.9)
Platelets: 132 10*3/uL — ABNORMAL LOW (ref 145–400)
RBC: 3.06 10*6/uL — ABNORMAL LOW (ref 3.70–5.45)
WBC: 8.4 10*3/uL (ref 3.9–10.3)

## 2009-03-20 LAB — MORPHOLOGY

## 2009-05-05 ENCOUNTER — Encounter (INDEPENDENT_AMBULATORY_CARE_PROVIDER_SITE_OTHER): Payer: Self-pay | Admitting: *Deleted

## 2009-05-12 ENCOUNTER — Encounter: Payer: Self-pay | Admitting: Internal Medicine

## 2009-05-17 ENCOUNTER — Telehealth (INDEPENDENT_AMBULATORY_CARE_PROVIDER_SITE_OTHER): Payer: Self-pay | Admitting: *Deleted

## 2009-05-22 ENCOUNTER — Encounter: Payer: Self-pay | Admitting: Internal Medicine

## 2009-05-22 ENCOUNTER — Ambulatory Visit: Payer: Self-pay | Admitting: Oncology

## 2009-05-24 ENCOUNTER — Encounter: Payer: Self-pay | Admitting: Internal Medicine

## 2009-05-24 LAB — CBC WITH DIFFERENTIAL/PLATELET
Basophils Absolute: 0 10*3/uL (ref 0.0–0.1)
Eosinophils Absolute: 0 10*3/uL (ref 0.0–0.5)
HGB: 8.6 g/dL — ABNORMAL LOW (ref 11.6–15.9)
MCV: 92.4 fL (ref 79.5–101.0)
MONO#: 0.5 10*3/uL (ref 0.1–0.9)
MONO%: 7.5 % (ref 0.0–14.0)
NEUT#: 4.8 10*3/uL (ref 1.5–6.5)
Platelets: 131 10*3/uL — ABNORMAL LOW (ref 145–400)
RDW: 15.7 % — ABNORMAL HIGH (ref 11.2–14.5)

## 2009-05-24 LAB — MORPHOLOGY: PLT EST: DECREASED

## 2009-05-25 LAB — COMPREHENSIVE METABOLIC PANEL
AST: 12 U/L (ref 0–37)
Albumin: 4.4 g/dL (ref 3.5–5.2)
BUN: 34 mg/dL — ABNORMAL HIGH (ref 6–23)
CO2: 22 mEq/L (ref 19–32)
Calcium: 9.8 mg/dL (ref 8.4–10.5)
Chloride: 108 mEq/L (ref 96–112)
Creatinine, Ser: 1.05 mg/dL (ref 0.40–1.20)
Glucose, Bld: 99 mg/dL (ref 70–99)
Potassium: 4.7 mEq/L (ref 3.5–5.3)

## 2009-05-25 LAB — LACTATE DEHYDROGENASE: LDH: 142 U/L (ref 94–250)

## 2009-06-05 ENCOUNTER — Ambulatory Visit: Payer: Self-pay | Admitting: Gastroenterology

## 2009-06-05 DIAGNOSIS — Z860100 Personal history of colon polyps, unspecified: Secondary | ICD-10-CM | POA: Insufficient documentation

## 2009-06-07 LAB — CBC WITH DIFFERENTIAL/PLATELET
BASO%: 0.7 % (ref 0.0–2.0)
EOS%: 0.7 % (ref 0.0–7.0)
HCT: 27 % — ABNORMAL LOW (ref 34.8–46.6)
MCHC: 35.5 g/dL (ref 31.5–36.0)
MONO#: 0.4 10*3/uL (ref 0.1–0.9)
RBC: 2.86 10*6/uL — ABNORMAL LOW (ref 3.70–5.45)
RDW: 17.1 % — ABNORMAL HIGH (ref 11.2–14.5)
WBC: 4.5 10*3/uL (ref 3.9–10.3)
lymph#: 1 10*3/uL (ref 0.9–3.3)

## 2009-06-21 ENCOUNTER — Telehealth: Payer: Self-pay | Admitting: Gastroenterology

## 2009-06-21 ENCOUNTER — Ambulatory Visit: Payer: Self-pay | Admitting: Oncology

## 2009-06-21 LAB — CBC WITH DIFFERENTIAL/PLATELET
BASO%: 0.4 % (ref 0.0–2.0)
Basophils Absolute: 0 10*3/uL (ref 0.0–0.1)
EOS%: 0.6 % (ref 0.0–7.0)
HCT: 30.5 % — ABNORMAL LOW (ref 34.8–46.6)
HGB: 10.4 g/dL — ABNORMAL LOW (ref 11.6–15.9)
MCH: 32.2 pg (ref 25.1–34.0)
MCHC: 34.3 g/dL (ref 31.5–36.0)
MONO#: 0.5 10*3/uL (ref 0.1–0.9)
NEUT%: 68.9 % (ref 38.4–76.8)
RDW: 16.5 % — ABNORMAL HIGH (ref 11.2–14.5)
WBC: 4.9 10*3/uL (ref 3.9–10.3)
lymph#: 0.9 10*3/uL (ref 0.9–3.3)

## 2009-06-30 ENCOUNTER — Encounter: Payer: Self-pay | Admitting: Gastroenterology

## 2009-06-30 ENCOUNTER — Ambulatory Visit: Payer: Self-pay | Admitting: Gastroenterology

## 2009-06-30 LAB — HM COLONOSCOPY

## 2009-07-04 ENCOUNTER — Encounter: Payer: Self-pay | Admitting: Gastroenterology

## 2009-07-05 ENCOUNTER — Encounter: Payer: Self-pay | Admitting: Internal Medicine

## 2009-07-05 LAB — CBC WITH DIFFERENTIAL/PLATELET
Basophils Absolute: 0 10*3/uL (ref 0.0–0.1)
Eosinophils Absolute: 0.1 10*3/uL (ref 0.0–0.5)
HCT: 33 % — ABNORMAL LOW (ref 34.8–46.6)
HGB: 11.3 g/dL — ABNORMAL LOW (ref 11.6–15.9)
MCH: 32.1 pg (ref 25.1–34.0)
NEUT#: 3.9 10*3/uL (ref 1.5–6.5)
NEUT%: 67 % (ref 38.4–76.8)
RDW: 16.8 % — ABNORMAL HIGH (ref 11.2–14.5)
lymph#: 1.3 10*3/uL (ref 0.9–3.3)

## 2009-07-12 ENCOUNTER — Ambulatory Visit: Payer: Self-pay | Admitting: Internal Medicine

## 2009-07-31 ENCOUNTER — Ambulatory Visit: Payer: Self-pay | Admitting: Oncology

## 2009-08-02 ENCOUNTER — Encounter: Payer: Self-pay | Admitting: Internal Medicine

## 2009-08-02 LAB — CBC WITH DIFFERENTIAL/PLATELET
BASO%: 0.6 % (ref 0.0–2.0)
Basophils Absolute: 0 10*3/uL (ref 0.0–0.1)
EOS%: 1.1 % (ref 0.0–7.0)
Eosinophils Absolute: 0.1 10*3/uL (ref 0.0–0.5)
HCT: 32.3 % — ABNORMAL LOW (ref 34.8–46.6)
HGB: 11.1 g/dL — ABNORMAL LOW (ref 11.6–15.9)
LYMPH%: 23.9 % (ref 14.0–49.7)
MCV: 90.9 fL (ref 79.5–101.0)
NEUT#: 3.9 10*3/uL (ref 1.5–6.5)
NEUT%: 67 % (ref 38.4–76.8)
Platelets: 165 10*3/uL (ref 145–400)
RDW: 16.3 % — ABNORMAL HIGH (ref 11.2–14.5)

## 2009-08-30 ENCOUNTER — Encounter: Payer: Self-pay | Admitting: Internal Medicine

## 2009-08-30 ENCOUNTER — Ambulatory Visit: Payer: Self-pay | Admitting: Oncology

## 2009-08-30 LAB — COMPREHENSIVE METABOLIC PANEL
Alkaline Phosphatase: 59 U/L (ref 39–117)
BUN: 33 mg/dL — ABNORMAL HIGH (ref 6–23)
CO2: 24 mEq/L (ref 19–32)
Creatinine, Ser: 1.06 mg/dL (ref 0.40–1.20)
Glucose, Bld: 101 mg/dL — ABNORMAL HIGH (ref 70–99)
Sodium: 140 mEq/L (ref 135–145)
Total Bilirubin: 0.6 mg/dL (ref 0.3–1.2)
Total Protein: 6.4 g/dL (ref 6.0–8.3)

## 2009-08-30 LAB — CBC & DIFF AND RETIC
BASO%: 0.2 % (ref 0.0–2.0)
EOS%: 1.5 % (ref 0.0–7.0)
Eosinophils Absolute: 0.1 10*3/uL (ref 0.0–0.5)
LYMPH%: 27.8 % (ref 14.0–49.7)
MCH: 29.4 pg (ref 25.1–34.0)
MCHC: 33.2 g/dL (ref 31.5–36.0)
MCV: 88.5 fL (ref 79.5–101.0)
MONO%: 9.6 % (ref 0.0–14.0)
NEUT#: 3.4 10*3/uL (ref 1.5–6.5)
Platelets: 140 10*3/uL — ABNORMAL LOW (ref 145–400)
RBC: 3.13 10*6/uL — ABNORMAL LOW (ref 3.70–5.45)
RDW: 16.5 % — ABNORMAL HIGH (ref 11.2–14.5)
Retic %: 1.4 % (ref 0.50–1.50)
nRBC: 0 % (ref 0–0)

## 2009-08-30 LAB — LACTATE DEHYDROGENASE: LDH: 156 U/L (ref 94–250)

## 2009-09-13 LAB — CBC WITH DIFFERENTIAL/PLATELET
Basophils Absolute: 0 10*3/uL (ref 0.0–0.1)
Eosinophils Absolute: 0.1 10*3/uL (ref 0.0–0.5)
HCT: 30.1 % — ABNORMAL LOW (ref 34.8–46.6)
HGB: 10.5 g/dL — ABNORMAL LOW (ref 11.6–15.9)
MONO#: 0.5 10*3/uL (ref 0.1–0.9)
NEUT#: 3.7 10*3/uL (ref 1.5–6.5)
RDW: 17.6 % — ABNORMAL HIGH (ref 11.2–14.5)
lymph#: 1.1 10*3/uL (ref 0.9–3.3)

## 2009-09-27 LAB — CBC WITH DIFFERENTIAL/PLATELET
Basophils Absolute: 0 10*3/uL (ref 0.0–0.1)
EOS%: 0.9 % (ref 0.0–7.0)
Eosinophils Absolute: 0.1 10*3/uL (ref 0.0–0.5)
HCT: 32 % — ABNORMAL LOW (ref 34.8–46.6)
HGB: 10.9 g/dL — ABNORMAL LOW (ref 11.6–15.9)
MCH: 31.1 pg (ref 25.1–34.0)
MCV: 91.4 fL (ref 79.5–101.0)
MONO%: 10.3 % (ref 0.0–14.0)
NEUT#: 5.1 10*3/uL (ref 1.5–6.5)
NEUT%: 71.8 % (ref 38.4–76.8)
lymph#: 1.2 10*3/uL (ref 0.9–3.3)

## 2009-10-06 ENCOUNTER — Encounter (INDEPENDENT_AMBULATORY_CARE_PROVIDER_SITE_OTHER): Payer: Self-pay | Admitting: *Deleted

## 2009-10-09 ENCOUNTER — Ambulatory Visit: Payer: Self-pay | Admitting: Oncology

## 2009-10-11 ENCOUNTER — Encounter: Payer: Self-pay | Admitting: Internal Medicine

## 2009-10-11 LAB — CBC WITH DIFFERENTIAL/PLATELET
Basophils Absolute: 0 10*3/uL (ref 0.0–0.1)
EOS%: 1.1 % (ref 0.0–7.0)
Eosinophils Absolute: 0.1 10*3/uL (ref 0.0–0.5)
HGB: 11.1 g/dL — ABNORMAL LOW (ref 11.6–15.9)
LYMPH%: 18.8 % (ref 14.0–49.7)
MCH: 31.3 pg (ref 25.1–34.0)
MCV: 93.2 fL (ref 79.5–101.0)
MONO%: 10.1 % (ref 0.0–14.0)
Platelets: 161 10*3/uL (ref 145–400)
RDW: 17.8 % — ABNORMAL HIGH (ref 11.2–14.5)

## 2009-10-25 ENCOUNTER — Encounter (INDEPENDENT_AMBULATORY_CARE_PROVIDER_SITE_OTHER): Payer: Self-pay | Admitting: *Deleted

## 2009-10-25 LAB — CBC WITH DIFFERENTIAL/PLATELET
BASO%: 0.5 % (ref 0.0–2.0)
LYMPH%: 24.7 % (ref 14.0–49.7)
MCHC: 33.3 g/dL (ref 31.5–36.0)
MONO#: 0.6 10*3/uL (ref 0.1–0.9)
RBC: 3.72 10*6/uL (ref 3.70–5.45)
WBC: 5.1 10*3/uL (ref 3.9–10.3)
lymph#: 1.2 10*3/uL (ref 0.9–3.3)

## 2009-11-06 ENCOUNTER — Ambulatory Visit: Payer: Self-pay | Admitting: Oncology

## 2009-11-14 LAB — CBC WITH DIFFERENTIAL/PLATELET
Basophils Absolute: 0 10*3/uL (ref 0.0–0.1)
HCT: 37.7 % (ref 34.8–46.6)
HGB: 12.5 g/dL (ref 11.6–15.9)
MONO#: 0.5 10*3/uL (ref 0.1–0.9)
NEUT#: 6.1 10*3/uL (ref 1.5–6.5)
NEUT%: 76.4 % (ref 38.4–76.8)
WBC: 8 10*3/uL (ref 3.9–10.3)
lymph#: 1.3 10*3/uL (ref 0.9–3.3)

## 2009-11-22 ENCOUNTER — Ambulatory Visit: Payer: Self-pay | Admitting: Internal Medicine

## 2009-11-22 ENCOUNTER — Encounter: Payer: Self-pay | Admitting: Internal Medicine

## 2009-11-27 ENCOUNTER — Encounter (INDEPENDENT_AMBULATORY_CARE_PROVIDER_SITE_OTHER): Payer: Self-pay | Admitting: *Deleted

## 2009-11-27 ENCOUNTER — Encounter: Payer: Self-pay | Admitting: Internal Medicine

## 2009-11-27 LAB — CBC WITH DIFFERENTIAL/PLATELET
Basophils Absolute: 0 10*3/uL (ref 0.0–0.1)
EOS%: 1.1 % (ref 0.0–7.0)
HCT: 33 % — ABNORMAL LOW (ref 34.8–46.6)
HGB: 11.3 g/dL — ABNORMAL LOW (ref 11.6–15.9)
MCH: 31.2 pg (ref 25.1–34.0)
MCHC: 34.2 g/dL (ref 31.5–36.0)
MCV: 91.2 fL (ref 79.5–101.0)
MONO%: 7 % (ref 0.0–14.0)
NEUT%: 73.8 % (ref 38.4–76.8)
RDW: 16.2 % — ABNORMAL HIGH (ref 11.2–14.5)

## 2009-11-27 LAB — COMPREHENSIVE METABOLIC PANEL
Albumin: 4.4 g/dL (ref 3.5–5.2)
CO2: 23 mEq/L (ref 19–32)
Calcium: 9.8 mg/dL (ref 8.4–10.5)
Glucose, Bld: 88 mg/dL (ref 70–99)
Potassium: 4.7 mEq/L (ref 3.5–5.3)
Sodium: 138 mEq/L (ref 135–145)
Total Protein: 6.4 g/dL (ref 6.0–8.3)

## 2009-11-27 LAB — LACTATE DEHYDROGENASE: LDH: 147 U/L (ref 94–250)

## 2009-12-16 LAB — HM DIABETES EYE EXAM: HM Diabetic Eye Exam: NORMAL

## 2009-12-18 ENCOUNTER — Ambulatory Visit: Payer: Self-pay | Admitting: Oncology

## 2009-12-20 LAB — CBC WITH DIFFERENTIAL/PLATELET
BASO%: 0.7 % (ref 0.0–2.0)
EOS%: 1.1 % (ref 0.0–7.0)
MCH: 31.2 pg (ref 25.1–34.0)
MCHC: 34.3 g/dL (ref 31.5–36.0)
MCV: 91 fL (ref 79.5–101.0)
MONO%: 4.7 % (ref 0.0–14.0)
NEUT%: 77.9 % — ABNORMAL HIGH (ref 38.4–76.8)
RDW: 17.2 % — ABNORMAL HIGH (ref 11.2–14.5)
lymph#: 0.9 10*3/uL (ref 0.9–3.3)

## 2009-12-21 ENCOUNTER — Ambulatory Visit: Payer: Self-pay | Admitting: Internal Medicine

## 2009-12-21 LAB — CONVERTED CEMR LAB
BUN: 35 mg/dL — ABNORMAL HIGH (ref 6–23)
Chloride: 108 meq/L (ref 96–112)
Potassium: 4.5 meq/L (ref 3.5–5.1)
TSH: 0.65 microintl units/mL (ref 0.35–5.50)

## 2010-01-09 ENCOUNTER — Ambulatory Visit: Payer: Self-pay | Admitting: Internal Medicine

## 2010-01-10 LAB — CBC WITH DIFFERENTIAL/PLATELET
Basophils Absolute: 0 10*3/uL (ref 0.0–0.1)
EOS%: 0.6 % (ref 0.0–7.0)
HGB: 10.2 g/dL — ABNORMAL LOW (ref 11.6–15.9)
MCH: 30.9 pg (ref 25.1–34.0)
MCV: 91.5 fL (ref 79.5–101.0)
MONO%: 6 % (ref 0.0–14.0)
NEUT#: 4.7 10*3/uL (ref 1.5–6.5)
RBC: 3.3 10*6/uL — ABNORMAL LOW (ref 3.70–5.45)
RDW: 17.9 % — ABNORMAL HIGH (ref 11.2–14.5)
lymph#: 1 10*3/uL (ref 0.9–3.3)

## 2010-01-29 ENCOUNTER — Ambulatory Visit: Payer: Self-pay | Admitting: Oncology

## 2010-02-07 ENCOUNTER — Ambulatory Visit: Payer: Self-pay | Admitting: Gynecology

## 2010-02-07 ENCOUNTER — Other Ambulatory Visit: Admission: RE | Admit: 2010-02-07 | Discharge: 2010-02-07 | Payer: Self-pay | Admitting: Gynecology

## 2010-02-13 ENCOUNTER — Encounter: Payer: Self-pay | Admitting: Internal Medicine

## 2010-02-19 ENCOUNTER — Encounter: Payer: Self-pay | Admitting: Internal Medicine

## 2010-02-19 LAB — CBC & DIFF AND RETIC
Basophils Absolute: 0 10*3/uL (ref 0.0–0.1)
EOS%: 1.4 % (ref 0.0–7.0)
HCT: 28 % — ABNORMAL LOW (ref 34.8–46.6)
HGB: 9.1 g/dL — ABNORMAL LOW (ref 11.6–15.9)
Immature Retic Fract: 4.3 % (ref 0.00–10.70)
MCH: 29.8 pg (ref 25.1–34.0)
MONO#: 0.5 10*3/uL (ref 0.1–0.9)
NEUT%: 65.9 % (ref 38.4–76.8)
Retic Ct Abs: 32.94 10*3/uL (ref 18.30–72.70)
lymph#: 1.7 10*3/uL (ref 0.9–3.3)

## 2010-02-19 LAB — MORPHOLOGY

## 2010-03-08 ENCOUNTER — Ambulatory Visit: Payer: Self-pay | Admitting: Oncology

## 2010-03-12 LAB — CBC WITH DIFFERENTIAL/PLATELET
Basophils Absolute: 0 10*3/uL (ref 0.0–0.1)
Eosinophils Absolute: 0.1 10*3/uL (ref 0.0–0.5)
HGB: 9.6 g/dL — ABNORMAL LOW (ref 11.6–15.9)
MONO#: 0.4 10*3/uL (ref 0.1–0.9)
NEUT#: 5.6 10*3/uL (ref 1.5–6.5)
RDW: 17.8 % — ABNORMAL HIGH (ref 11.2–14.5)
lymph#: 1.5 10*3/uL (ref 0.9–3.3)

## 2010-04-02 ENCOUNTER — Encounter: Payer: Self-pay | Admitting: Internal Medicine

## 2010-04-02 LAB — CBC WITH DIFFERENTIAL/PLATELET
BASO%: 0.6 % (ref 0.0–2.0)
HCT: 29.8 % — ABNORMAL LOW (ref 34.8–46.6)
LYMPH%: 25.3 % (ref 14.0–49.7)
MCH: 31.3 pg (ref 25.1–34.0)
MCHC: 33.9 g/dL (ref 31.5–36.0)
MCV: 92.3 fL (ref 79.5–101.0)
MONO#: 0.4 10*3/uL (ref 0.1–0.9)
MONO%: 8.1 % (ref 0.0–14.0)
NEUT%: 64.6 % (ref 38.4–76.8)
Platelets: 157 10*3/uL (ref 145–400)
WBC: 4.7 10*3/uL (ref 3.9–10.3)

## 2010-04-20 ENCOUNTER — Ambulatory Visit: Payer: Self-pay | Admitting: Oncology

## 2010-04-23 ENCOUNTER — Encounter: Payer: Self-pay | Admitting: Internal Medicine

## 2010-04-23 LAB — CBC WITH DIFFERENTIAL/PLATELET
Basophils Absolute: 0 10*3/uL (ref 0.0–0.1)
Eosinophils Absolute: 0.1 10*3/uL (ref 0.0–0.5)
HGB: 10.5 g/dL — ABNORMAL LOW (ref 11.6–15.9)
MONO#: 0.3 10*3/uL (ref 0.1–0.9)
NEUT#: 4 10*3/uL (ref 1.5–6.5)
Platelets: 171 10*3/uL (ref 145–400)
RBC: 3.32 10*6/uL — ABNORMAL LOW (ref 3.70–5.45)
RDW: 17 % — ABNORMAL HIGH (ref 11.2–14.5)
WBC: 5.7 10*3/uL (ref 3.9–10.3)

## 2010-05-04 ENCOUNTER — Ambulatory Visit: Payer: Self-pay | Admitting: Internal Medicine

## 2010-05-04 LAB — CONVERTED CEMR LAB
Albumin: 4.3 g/dL (ref 3.5–5.2)
Alkaline Phosphatase: 63 units/L (ref 39–117)
BUN: 37 mg/dL — ABNORMAL HIGH (ref 6–23)
Calcium: 9.5 mg/dL (ref 8.4–10.5)
Cholesterol: 205 mg/dL — ABNORMAL HIGH (ref 0–200)
Creatinine, Ser: 1.1 mg/dL (ref 0.4–1.2)
Direct LDL: 101.5 mg/dL
GFR calc non Af Amer: 49.78 mL/min (ref >60)
Glucose, Bld: 87 mg/dL (ref 70–99)
HDL: 56.1 mg/dL (ref >39.00)
Hgb A1c MFr Bld: 4.8 % (ref 4.6–6.5)
Sodium: 142 meq/L (ref 135–145)
Triglycerides: 201 mg/dL — ABNORMAL HIGH (ref 0.0–149.0)

## 2010-05-11 ENCOUNTER — Ambulatory Visit: Payer: Self-pay | Admitting: Internal Medicine

## 2010-05-15 LAB — CBC WITH DIFFERENTIAL/PLATELET
Basophils Absolute: 0 10*3/uL (ref 0.0–0.1)
Eosinophils Absolute: 0 10*3/uL (ref 0.0–0.5)
HCT: 31.9 % — ABNORMAL LOW (ref 34.8–46.6)
LYMPH%: 43.7 % (ref 14.0–49.7)
MCV: 89.3 fL (ref 79.5–101.0)
MONO#: 0.6 10*3/uL (ref 0.1–0.9)
NEUT#: 2 10*3/uL (ref 1.5–6.5)
NEUT%: 42.8 % (ref 38.4–76.8)
Platelets: 130 10*3/uL — ABNORMAL LOW (ref 145–400)
WBC: 4.7 10*3/uL (ref 3.9–10.3)

## 2010-05-31 ENCOUNTER — Ambulatory Visit: Payer: Self-pay | Admitting: Oncology

## 2010-06-04 LAB — CBC WITH DIFFERENTIAL/PLATELET
Basophils Absolute: 0 10*3/uL (ref 0.0–0.1)
EOS%: 1.3 % (ref 0.0–7.0)
Eosinophils Absolute: 0.1 10*3/uL (ref 0.0–0.5)
HGB: 11.2 g/dL — ABNORMAL LOW (ref 11.6–15.9)
MCV: 89.3 fL (ref 79.5–101.0)
MONO%: 6.8 % (ref 0.0–14.0)
NEUT#: 3.1 10*3/uL (ref 1.5–6.5)
RBC: 3.65 10*6/uL — ABNORMAL LOW (ref 3.70–5.45)
RDW: 16.8 % — ABNORMAL HIGH (ref 11.2–14.5)
lymph#: 1.4 10*3/uL (ref 0.9–3.3)

## 2010-06-25 ENCOUNTER — Encounter: Payer: Self-pay | Admitting: Internal Medicine

## 2010-06-25 LAB — CBC & DIFF AND RETIC
Basophils Absolute: 0 10*3/uL (ref 0.0–0.1)
EOS%: 1.3 % (ref 0.0–7.0)
HCT: 35.4 % (ref 34.8–46.6)
HGB: 11.7 g/dL (ref 11.6–15.9)
LYMPH%: 31.2 % (ref 14.0–49.7)
MCH: 29.3 pg (ref 25.1–34.0)
MCV: 88.5 fL (ref 79.5–101.0)
MONO%: 7.3 % (ref 0.0–14.0)
NEUT%: 60 % (ref 38.4–76.8)

## 2010-06-25 LAB — COMPREHENSIVE METABOLIC PANEL
ALT: 16 U/L (ref 0–35)
BUN: 34 mg/dL — ABNORMAL HIGH (ref 6–23)
CO2: 24 mEq/L (ref 19–32)
Calcium: 10.2 mg/dL (ref 8.4–10.5)
Chloride: 106 mEq/L (ref 96–112)
Creatinine, Ser: 1.04 mg/dL (ref 0.40–1.20)

## 2010-06-25 LAB — LACTATE DEHYDROGENASE: LDH: 148 U/L (ref 94–250)

## 2010-07-02 ENCOUNTER — Ambulatory Visit: Payer: Self-pay | Admitting: Oncology

## 2010-07-02 LAB — CBC WITH DIFFERENTIAL/PLATELET
BASO%: 0.7 % (ref 0.0–2.0)
Basophils Absolute: 0 10*3/uL (ref 0.0–0.1)
EOS%: 1.2 % (ref 0.0–7.0)
HGB: 10.8 g/dL — ABNORMAL LOW (ref 11.6–15.9)
MCH: 30.3 pg (ref 25.1–34.0)
RDW: 16.2 % — ABNORMAL HIGH (ref 11.2–14.5)
lymph#: 1.5 10*3/uL (ref 0.9–3.3)

## 2010-07-23 LAB — CBC WITH DIFFERENTIAL/PLATELET
EOS%: 1.7 % (ref 0.0–7.0)
Eosinophils Absolute: 0.1 10*3/uL (ref 0.0–0.5)
LYMPH%: 25.5 % (ref 14.0–49.7)
MCH: 29.1 pg (ref 25.1–34.0)
MCV: 89.2 fL (ref 79.5–101.0)
MONO%: 7.3 % (ref 0.0–14.0)
NEUT#: 3.5 10*3/uL (ref 1.5–6.5)
Platelets: 151 10*3/uL (ref 145–400)
RBC: 3.81 10*6/uL (ref 3.70–5.45)
nRBC: 0 % (ref 0–0)

## 2010-08-13 ENCOUNTER — Ambulatory Visit: Payer: Self-pay | Admitting: Oncology

## 2010-08-13 LAB — CBC WITH DIFFERENTIAL/PLATELET
BASO%: 0.6 % (ref 0.0–2.0)
EOS%: 1.6 % (ref 0.0–7.0)
HCT: 29.2 % — ABNORMAL LOW (ref 34.8–46.6)
LYMPH%: 22.3 % (ref 14.0–49.7)
MCH: 29.5 pg (ref 25.1–34.0)
MCHC: 33.5 g/dL (ref 31.5–36.0)
NEUT%: 67.3 % (ref 38.4–76.8)
Platelets: 178 10*3/uL (ref 145–400)
RBC: 3.32 10*6/uL — ABNORMAL LOW (ref 3.70–5.45)
lymph#: 1.1 10*3/uL (ref 0.9–3.3)

## 2010-09-03 LAB — CBC WITH DIFFERENTIAL/PLATELET
Basophils Absolute: 0 10*3/uL (ref 0.0–0.1)
EOS%: 1.5 % (ref 0.0–7.0)
Eosinophils Absolute: 0.1 10*3/uL (ref 0.0–0.5)
HCT: 30.3 % — ABNORMAL LOW (ref 34.8–46.6)
HGB: 10.3 g/dL — ABNORMAL LOW (ref 11.6–15.9)
LYMPH%: 21.9 % (ref 14.0–49.7)
MCH: 29.8 pg (ref 25.1–34.0)
MCV: 88 fL (ref 79.5–101.0)
MONO%: 5.1 % (ref 0.0–14.0)
NEUT#: 4.1 10*3/uL (ref 1.5–6.5)
NEUT%: 71 % (ref 38.4–76.8)
Platelets: 211 10*3/uL (ref 145–400)
RDW: 17.1 % — ABNORMAL HIGH (ref 11.2–14.5)

## 2010-09-21 ENCOUNTER — Ambulatory Visit: Payer: Self-pay | Admitting: Oncology

## 2010-09-21 LAB — CBC WITH DIFFERENTIAL/PLATELET
Basophils Absolute: 0 10*3/uL (ref 0.0–0.1)
Eosinophils Absolute: 0.1 10*3/uL (ref 0.0–0.5)
HGB: 9.5 g/dL — ABNORMAL LOW (ref 11.6–15.9)
MCV: 88.1 fL (ref 79.5–101.0)
MONO#: 0.5 10*3/uL (ref 0.1–0.9)
NEUT#: 3.8 10*3/uL (ref 1.5–6.5)
RBC: 3.1 10*6/uL — ABNORMAL LOW (ref 3.70–5.45)
RDW: 16.2 % — ABNORMAL HIGH (ref 11.2–14.5)
WBC: 5.9 10*3/uL (ref 3.9–10.3)
lymph#: 1.4 10*3/uL (ref 0.9–3.3)

## 2010-10-15 LAB — CBC WITH DIFFERENTIAL/PLATELET
BASO%: 0.5 % (ref 0.0–2.0)
EOS%: 1.4 % (ref 0.0–7.0)
HCT: 29.9 % — ABNORMAL LOW (ref 34.8–46.6)
HGB: 10.1 g/dL — ABNORMAL LOW (ref 11.6–15.9)
MCHC: 33.9 g/dL (ref 31.5–36.0)
MCV: 89.9 fL (ref 79.5–101.0)
NEUT#: 3.9 10*3/uL (ref 1.5–6.5)
NEUT%: 72.4 % (ref 38.4–76.8)
Platelets: 202 10*3/uL (ref 145–400)
RDW: 16.9 % — ABNORMAL HIGH (ref 11.2–14.5)
WBC: 5.4 10*3/uL (ref 3.9–10.3)
lymph#: 1.1 10*3/uL (ref 0.9–3.3)

## 2010-10-16 ENCOUNTER — Ambulatory Visit: Payer: Self-pay | Admitting: Internal Medicine

## 2010-10-16 LAB — CONVERTED CEMR LAB
ALT: 18 units/L (ref 0–35)
AST: 19 units/L (ref 0–37)
Albumin: 4.2 g/dL (ref 3.5–5.2)
Alkaline Phosphatase: 69 units/L (ref 39–117)
BUN: 31 mg/dL — ABNORMAL HIGH (ref 6–23)
Basophils Absolute: 0 10*3/uL (ref 0.0–0.1)
Bilirubin, Direct: 0.1 mg/dL (ref 0.0–0.3)
Calcium: 9.5 mg/dL (ref 8.4–10.5)
Cholesterol: 162 mg/dL (ref 0–200)
Creatinine, Ser: 1.2 mg/dL (ref 0.4–1.2)
Eosinophils Absolute: 0.1 10*3/uL (ref 0.0–0.7)
HCT: 30 % — ABNORMAL LOW (ref 36.0–46.0)
Hgb A1c MFr Bld: 5 % (ref 4.6–6.5)
Lymphocytes Relative: 30 % (ref 12.0–46.0)
Lymphs Abs: 1.4 10*3/uL (ref 0.7–4.0)
MCHC: 34.5 g/dL (ref 30.0–36.0)
Monocytes Relative: 7.9 % (ref 3.0–12.0)
Neutro Abs: 2.9 10*3/uL (ref 1.4–7.7)
Platelets: 174 10*3/uL (ref 150.0–400.0)
RDW: 16.8 % — ABNORMAL HIGH (ref 11.5–14.6)
TSH: 1.42 microintl units/mL (ref 0.35–5.50)
Total Protein: 6.3 g/dL (ref 6.0–8.3)
Triglycerides: 171 mg/dL — ABNORMAL HIGH (ref 0.0–149.0)

## 2010-10-26 ENCOUNTER — Ambulatory Visit: Payer: Self-pay | Admitting: Internal Medicine

## 2010-10-26 ENCOUNTER — Ambulatory Visit: Payer: Self-pay | Admitting: Oncology

## 2010-10-30 ENCOUNTER — Encounter: Payer: Self-pay | Admitting: Internal Medicine

## 2010-10-30 LAB — CBC WITH DIFFERENTIAL/PLATELET
Eosinophils Absolute: 0.1 10*3/uL (ref 0.0–0.5)
LYMPH%: 13.7 % — ABNORMAL LOW (ref 14.0–49.7)
MCV: 88.3 fL (ref 79.5–101.0)
MONO%: 9.8 % (ref 0.0–14.0)
NEUT#: 6.2 10*3/uL (ref 1.5–6.5)
NEUT%: 74.6 % (ref 38.4–76.8)
Platelets: 200 10*3/uL (ref 145–400)
RBC: 2.85 10*6/uL — ABNORMAL LOW (ref 3.70–5.45)

## 2010-11-13 LAB — CBC WITH DIFFERENTIAL/PLATELET
BASO%: 0.5 % (ref 0.0–2.0)
EOS%: 1.5 % (ref 0.0–7.0)
LYMPH%: 26.1 % (ref 14.0–49.7)
MCH: 30.5 pg (ref 25.1–34.0)
MCHC: 34.1 g/dL (ref 31.5–36.0)
MCV: 89.4 fL (ref 79.5–101.0)
MONO#: 0.5 10*3/uL (ref 0.1–0.9)
MONO%: 9.6 % (ref 0.0–14.0)
Platelets: 200 10*3/uL (ref 145–400)
RBC: 3.4 10*6/uL — ABNORMAL LOW (ref 3.70–5.45)
WBC: 5.2 10*3/uL (ref 3.9–10.3)

## 2010-11-27 LAB — CBC WITH DIFFERENTIAL/PLATELET
Basophils Absolute: 0 10*3/uL (ref 0.0–0.1)
Eosinophils Absolute: 0.1 10*3/uL (ref 0.0–0.5)
HGB: 9.6 g/dL — ABNORMAL LOW (ref 11.6–15.9)
LYMPH%: 27.5 % (ref 14.0–49.7)
MCV: 88.7 fL (ref 79.5–101.0)
MONO%: 8.2 % (ref 0.0–14.0)
NEUT#: 2.7 10*3/uL (ref 1.5–6.5)
Platelets: 190 10*3/uL (ref 145–400)

## 2010-12-06 ENCOUNTER — Ambulatory Visit: Payer: Self-pay | Admitting: Oncology

## 2010-12-25 LAB — CBC WITH DIFFERENTIAL/PLATELET
BASO%: 0.5 % (ref 0.0–2.0)
Basophils Absolute: 0 10*3/uL (ref 0.0–0.1)
EOS%: 1.7 % (ref 0.0–7.0)
Eosinophils Absolute: 0.1 10*3/uL (ref 0.0–0.5)
HCT: 28.9 % — ABNORMAL LOW (ref 34.8–46.6)
HGB: 9.8 g/dL — ABNORMAL LOW (ref 11.6–15.9)
LYMPH%: 20.1 % (ref 14.0–49.7)
MCH: 30 pg (ref 25.1–34.0)
MCHC: 33.8 g/dL (ref 31.5–36.0)
MCV: 88.6 fL (ref 79.5–101.0)
MONO#: 0.5 10*3/uL (ref 0.1–0.9)
MONO%: 6.8 % (ref 0.0–14.0)
NEUT#: 4.9 10*3/uL (ref 1.5–6.5)
NEUT%: 70.9 % (ref 38.4–76.8)
Platelets: 181 10*3/uL (ref 145–400)
RBC: 3.27 10*6/uL — ABNORMAL LOW (ref 3.70–5.45)
RDW: 16.3 % — ABNORMAL HIGH (ref 11.2–14.5)
WBC: 6.9 10*3/uL (ref 3.9–10.3)
lymph#: 1.4 10*3/uL (ref 0.9–3.3)

## 2011-01-08 ENCOUNTER — Ambulatory Visit: Payer: Self-pay | Admitting: Oncology

## 2011-01-08 LAB — CBC WITH DIFFERENTIAL/PLATELET
BASO%: 0.6 % (ref 0.0–2.0)
Basophils Absolute: 0 10*3/uL (ref 0.0–0.1)
EOS%: 2.3 % (ref 0.0–7.0)
Eosinophils Absolute: 0.1 10*3/uL (ref 0.0–0.5)
HCT: 31.6 % — ABNORMAL LOW (ref 34.8–46.6)
HGB: 10.6 g/dL — ABNORMAL LOW (ref 11.6–15.9)
LYMPH%: 23.5 % (ref 14.0–49.7)
MCH: 30 pg (ref 25.1–34.0)
MCHC: 33.7 g/dL (ref 31.5–36.0)
MCV: 89 fL (ref 79.5–101.0)
MONO#: 0.5 10*3/uL (ref 0.1–0.9)
MONO%: 10.2 % (ref 0.0–14.0)
NEUT#: 3 10*3/uL (ref 1.5–6.5)
NEUT%: 63.4 % (ref 38.4–76.8)
Platelets: 168 10*3/uL (ref 145–400)
RBC: 3.55 10*6/uL — ABNORMAL LOW (ref 3.70–5.45)
RDW: 16.9 % — ABNORMAL HIGH (ref 11.2–14.5)
WBC: 4.7 10*3/uL (ref 3.9–10.3)
lymph#: 1.1 10*3/uL (ref 0.9–3.3)

## 2011-01-15 NOTE — Letter (Signed)
Summary: [place]   Imported By: Laural Benes 12/28/2009 14:45:41  _____________________________________________________________________  External Attachment:    Type:   Image     Comment:   External Document

## 2011-01-15 NOTE — Letter (Signed)
Summary: Brittany [name]   Imported By: Laural Benes 12/28/2009 14:56:33  _____________________________________________________________________  External Attachment:    Type:   Image     Comment:   External Document

## 2011-01-15 NOTE — Letter (Signed)
Summary: [place]   Imported By: Laural Benes 11/14/2010 10:12:17  _____________________________________________________________________  External Attachment:    Type:   Image     Comment:   External Document

## 2011-01-15 NOTE — Assessment & Plan Note (Signed)
Summary: 30 min ov/njr/pt rsc/cjr   Vital Signs:  Patient profile:   77 year old Brittany [name] Menstrual status:  hysterectomy Height:      60 inches Weight:      143 pounds Temp:     97.8 degrees F oral Pulse rate:   84 / minute Pulse rhythm:   regular BP sitting:   142 / 66  (left arm) Cuff size:   regular  Vitals Entered By: Townsend Roger, [place] (October 26, 2010 10:23 AM) CC: 77mth f/u   Primary Care Provider:  Lucila Maine  CC:  77mth f/u.  History of Present Illness:  Follow-Up Visit      This is an 77 year old woman who presents for Follow-up visit.  The patient denies chest pain and palpitations.  Since the last visit the patient notes no new problems or concerns and being seen by a specialist.  The patient reports taking meds as prescribed.  When questioned about possible medication side effects, the patient notes none.    ROS:has chronic MSK aches and pains but otherwise no complaints  Current Medications (verified): 1)  Benicar 20 Mg Tabs (Olmesartan Medoxomil) .... Take 1 Tablet By Mouth Once A Day 2)  Levothyroxine Sodium 88 Mcg Tabs (Levothyroxine Sodium) .... Take 1 Tablet By Mouth Once A Day 3)  Verapamil Hcl Cr 240 Mg Tbcr (Verapamil Hcl) .... Take 1 1/2 Tablet By Mouth Once A Day 4)  Aspirin 81 Mg  Tbec (Aspirin) .... Once Daily 5)  Caltrate 600+d 600-400 Mg-Unit  Tabs (Calcium Carbonate-Vitamin D) .... Twice Daily 6)  Multivitamins   Tabs (Multiple Vitamin) .... Once Daily 7)  Restasis 0.05 %  Emul (Cyclosporine) .... Use Two Times A Day As Directed 8)  Vitamin C 500 Mg  Chew (Ascorbic Acid) .... Once Daily 9)  Tylenol Ex St Arthritis Pain 500 Mg  Tabs (Acetaminophen) .... One 5x Day 10)  Xyzal 5 Mg Tabs (Levocetirizine Dihydrochloride) .... One By Mouth Daily As Needed 11)  Vitamin D3 1000 Unit Caps (Cholecalciferol) .... Once Daily  Allergies (verified): 1)  Lisinopril (Lisinopril) 2)  Penicillin G Potassium (Penicillin G Potassium)  Physical  Exam  General:  elderly Brittany [name] in no acute distress. HEENT exam atraumatic, normocephalic symmetric her muscles are intact. Neck is supple. Chest clear auscultation. Cardiac exam S1-S2 are regular. Abdominal exam overweight, to bowel sounds, soft her extremities there is no clubbing cyanosis or edema. Neurologic exam she is alert. No motor or sensory deficits.   Impression & Recommendations:  Problem # 1:  MYELODYSPLASTIC SYNDROME (ICD-238.75) followed by hematology  Problem # 2:  HYPOTHYROIDISM (ICD-244.9) controlled continue current medications  Her updated medication list for this problem includes:    Levothyroxine Sodium 88 Mcg Tabs (Levothyroxine sodium) .Marland Kitchen... Take 1 tablet by mouth once a day  Labs Reviewed: TSH: 1.42 (10/16/2010)    HgBA1c: 5.0 (10/16/2010) Chol: 162 (10/16/2010)   HDL: 51.10 (10/16/2010)   LDL: 77 (10/16/2010)   TG: 171.0 (10/16/2010)  Problem # 3:  HYPERTENSION (ICD-401.9) reasonable control continue current medications  The following medications were removed from the medication list:    Benicar 20 Mg Tabs (Olmesartan medoxomil) .Marland Kitchen... Take 1 tablet by mouth once a day Her updated medication list for this problem includes:    Verapamil Hcl Cr 240 Mg Tbcr (Verapamil hcl) .Marland Kitchen... Take 1 1/2 tablet by mouth once a day    Losartan Potassium 100 Mg Tabs (Losartan potassium) .Marland Kitchen... Take 1 tablet by mouth once a day  BP today: 142/66 Prior BP: 126/60 (05/11/2010)  Labs Reviewed: K+: 4.9 (10/16/2010) Creat: : 1.2 (10/16/2010)   Chol: 162 (10/16/2010)   HDL: 51.10 (10/16/2010)   LDL: 77 (10/16/2010)   TG: 171.0 (10/16/2010)  Problem # 4:  DIABETES MELLITUS, TYPE II (ICD-250.00) resolved and on no medications The following medications were removed from the medication list:    Benicar 20 Mg Tabs (Olmesartan medoxomil) .Marland Kitchen... Take 1 tablet by mouth once a day Her updated medication list for this problem includes:    Aspirin 81 Mg Tbec (Aspirin) ..... Once daily     Losartan Potassium 100 Mg Tabs (Losartan potassium) .Marland Kitchen... Take 1 tablet by mouth once a day  Labs Reviewed: Creat: 1.2 (10/16/2010)     Last Eye Exam: normal-pt's report (12/16/2009) Reviewed HgBA1c results: 5.0 (10/16/2010)  4.8 (05/04/2010)  Complete Medication List: 1)  Levothyroxine Sodium 88 Mcg Tabs (Levothyroxine sodium) .... Take 1 tablet by mouth once a day 2)  Verapamil Hcl Cr 240 Mg Tbcr (Verapamil hcl) .... Take 1 1/2 tablet by mouth once a day 3)  Aspirin 81 Mg Tbec (Aspirin) .... Once daily 4)  Caltrate 600+d 600-400 Mg-unit Tabs (Calcium carbonate-vitamin d) .... Twice daily 5)  Multivitamins Tabs (Multiple vitamin) .... Once daily 6)  Restasis 0.05 % Emul (Cyclosporine) .... Use two times a day as directed 7)  Vitamin C 500 Mg Chew (Ascorbic acid) .... Once daily 8)  Tylenol Ex St Arthritis Pain 500 Mg Tabs (Acetaminophen) .... One 5x day 9)  Xyzal 5 Mg Tabs (Levocetirizine dihydrochloride) .... One by mouth daily as needed 10)  Vitamin D3 1000 Unit Caps (Cholecalciferol) .... Once daily 11)  Losartan Potassium 100 Mg Tabs (Losartan potassium) .... Take 1 tablet by mouth once a day  Patient Instructions: 1)  6 months Prescriptions: VERAPAMIL HCL CR 240 MG TBCR (VERAPAMIL HCL) Take 1 1/2 tablet by mouth once a day  #150 x 3   Entered and Authorized by:   Phoebe Sharps MD   Signed by:   Phoebe Sharps MD on 10/26/2010   Method used:   Faxed to ...       [facility] (mail-order)       [place]  [address]       Ph: [phone]       Fax: [phone]   RxID:   [id number] LEVOTHYROXINE SODIUM 88 MCG TABS (LEVOTHYROXINE SODIUM) Take 1 tablet by mouth once a day  #90 x 3   Entered and Authorized by:   Phoebe Sharps MD   Signed by:   Phoebe Sharps MD on 10/26/2010   Method used:   Faxed to ...       [facility] (mail-order)       [place]  [address]       Ph: [phone]       Fax: [phone]   RxID:    [id number] LOSARTAN POTASSIUM 100 MG TABS (LOSARTAN POTASSIUM) Take 1 tablet by mouth once a day  #90 x 3   Entered and Authorized by:   Phoebe Sharps MD   Signed by:   Phoebe Sharps MD on 10/26/2010   Method used:   Faxed to ...       [facility] (mail[facility]order)       [address] [place]  [address]  Ph: [phone]       Fax: [phone]   RxID:   [id number]    Orders Added: 1)  Est. Patient Level IV [id number]

## 2011-01-15 NOTE — Assessment & Plan Note (Signed)
Summary: [place].NJR/RSC PER BMP/NJR/pt rescd from bump//ccm rsc b...   Vital Signs:  Patient profile:   77 year old female Weight:      142 pounds Temp:     97.6 degrees F Pulse rate:   84 / minute Resp:     12 per minute BP sitting:   126 / 60  (left arm)  Vitals Entered By: Rica Records, RN (January 09, 2010 11:14 AM)   Primary Care Provider:  Lucila Maine   History of Present Illness:  Follow-Up Visit      This is an 77 year old woman who presents for Follow-up visit.  The patient denies chest pain, palpitations, dizziness, syncope, edema, SOB, DOE, PND, and orthopnea.  Since the last visit the patient notes being seen by a specialist.  The patient reports taking meds as prescribed.  When questioned about possible medication side effects, the patient notes none.    has seen ortho for shoulder and neurosurg for back hematology for anemia (MDS) All other systems reviewed and were negative     Preventive Screening-Counseling & Management  Alcohol-Tobacco     Smoking Status: never  Current Problems (verified): 1)  Colonic Polyps, Hx of  (ICD-V12.72) 2)  Myelodysplastic Syndrome  (ICD-238.75) 3)  Back Pain--scoliosis  (ICD-724.5) 4)  Osteoporosis  (ICD-733.00) 5)  Hypothyroidism  (ICD-244.9) 6)  Hypertension  (ICD-401.9) 7)  Hyperlipidemia  (ICD-272.4) 8)  Diverticulosis, Colon  (ICD-562.10) 9)  Diabetes Mellitus, Type II  (ICD-250.00) 10)  Skin Cancer, Hx of  (ICD-V10.83)  Current Medications (verified): 1)  Benicar 20 Mg Tabs (Olmesartan Medoxomil) .... Take 1 Tablet By Mouth Once A Day 2)  Levothyroxine Sodium 88 Mcg Tabs (Levothyroxine Sodium) .... Take 1 Tablet By Mouth Once A Day 3)  Verapamil Hcl Cr 240 Mg Tbcr (Verapamil Hcl) .... Take 1 1/2 Tablet By Mouth Once A Day 4)  Aspirin 81 Mg  Tbec (Aspirin) .... Once Daily 5)  Caltrate 600+d 600-400 Mg-Unit  Tabs (Calcium Carbonate-Vitamin D) .... Twice Daily 6)  Multivitamins   Tabs (Multiple Vitamin) ....  Once Daily 7)  Restasis 0.05 %  Emul (Cyclosporine) .... Use Two Times A Day As Directed 8)  Vitamin C 500 Mg  Chew (Ascorbic Acid) .... Once Daily 9)  Tylenol Ex St Arthritis Pain 500 Mg  Tabs (Acetaminophen) .... One 5x Day 10)  Xyzal 5 Mg Tabs (Levocetirizine Dihydrochloride) .... One By Mouth Daily As Needed 11)  Vitamin D3 1000 Unit Caps (Cholecalciferol) .... Once Daily  Allergies: 1)  Lisinopril (Lisinopril) 2)  Penicillin G Potassium (Penicillin G Potassium)  Comments:  Nurse/Medical Assistant: 6 month rov, labs done  The patient's medications and allergies were reviewed with the patient and were updated in the Medication and Allergy Lists. Rica Records, RN (January 09, 2010 11:16 AM)  Past History:  Past Medical History: Last updated: 08/21/2007 Skin cancer, hx of--melanoma X2 Diabetes mellitus, type II Diverticulosis, colon--on colonoscopy Hyperlipidemia Hypertension Hypothyroidism Osteoporosis  Past Surgical History: Last updated: 08/21/2007 lapaoscopic polyp removal Appendectomy Carpal tunnel release excision melanoma  1986,  2005 knee pain Total knee replacement  R 2005,  L  05/05 transfusion 2006 rectocelel repair  Family History: Last updated: 07/12/2009 noncontributory  Social History: Last updated: 12/22/2007 Married Never Smoked  Risk Factors: Smoking Status: never (01/09/2010)  Review of Systems       All other systems reviewed and were negative   Physical Exam  General:  Well-developed,well-nourished,in no acute distress; alert,appropriate and cooperative  throughout examination Head:  normocephalic and atraumatic.   Eyes:  pupils equal and pupils round.   Neck:  No deformities, masses, or tenderness noted. Chest Wall:  No deformities, masses, or tenderness noted. Lungs:  Normal respiratory effort, chest expands symmetrically. Lungs are clear to auscultation, no crackles or wheezes. Heart:  Normal rate and regular rhythm. S1 and S2  normal without gallop, murmur, click, rub or other extra sounds. Abdomen:  soft and non-tender.   Msk:  No deformity or scoliosis noted of thoracic or lumbar spine.   Pulses:  R radial normal and L radial normal.   Neurologic:  cranial nerves II-XII intact and gait normal.   Skin:  turgor normal and color normal.   Psych:  normally interactive and good eye contact.    Diabetes Management Exam:    Eye Exam:       Eye Exam done elsewhere          Date: 12/16/2009          Results: normal-pt's report          Done by: ophthalm   Impression & Recommendations:  Problem # 1:  MYELODYSPLASTIC SYNDROME (ICD-238.75) f/u hematology  Problem # 2:  HYPOTHYROIDISM (ICD-244.9)  controlled continue current medications  Her updated medication list for this problem includes:    Levothyroxine Sodium 88 Mcg Tabs (Levothyroxine sodium) .Marland Kitchen... Take 1 tablet by mouth once a day  Labs Reviewed: TSH: 0.65 (12/21/2009)    HgBA1c: 5.1 (01/02/2009) Chol: 165 (01/02/2009)   HDL: 53.5 (01/02/2009)   LDL: 85 (01/02/2009)   TG: 132 (01/02/2009)  Orders: Prescription Created Electronically [phone])  Problem # 3:  HYPERTENSION (ICD-401.9) controlled continue current medications  Her updated medication list for this problem includes:    Benicar 20 Mg Tabs (Olmesartan medoxomil) .Marland Kitchen... Take 1 tablet by mouth once a day    Verapamil Hcl Cr 240 Mg Tbcr (Verapamil hcl) .Marland Kitchen... Take 1 1/2 tablet by mouth once a day  BP today: 126/60 Prior BP: 130/66 (07/12/2009)  Labs Reviewed: K+: 4.5 (12/21/2009) Creat: : 1.0 (12/21/2009)   Chol: 165 (01/02/2009)   HDL: 53.5 (01/02/2009)   LDL: 85 (01/02/2009)   TG: 132 (01/02/2009)  Problem # 4:  HYPERLIPIDEMIA (ICD-272.4) no meds controlled Labs Reviewed: SGOT: 20 (01/02/2009)   SGPT: 20 (01/02/2009)   HDL:53.5 (01/02/2009), 53.6 (06/14/2008)  LDL:85 (01/02/2009), DEL (06/14/2008)  Chol:165 (01/02/2009), 180 (06/14/2008)  Trig:132 (01/02/2009), 212  (06/14/2008)  Problem # 5:  DIABETES MELLITUS, TYPE II (ICD-250.00)  Her updated medication list for this problem includes:    Benicar 20 Mg Tabs (Olmesartan medoxomil) .Marland Kitchen... Take 1 tablet by mouth once a day    Aspirin 81 Mg Tbec (Aspirin) ..... Once daily  Labs Reviewed: Creat: 1.0 (12/21/2009)     Last Eye Exam: normal-pt's report (12/16/2009) Reviewed HgBA1c results: 5.1 (01/02/2009)  5.3 (06/14/2008)  Complete Medication List: 1)  Benicar 20 Mg Tabs (Olmesartan medoxomil) .... Take 1 tablet by mouth once a day 2)  Levothyroxine Sodium 88 Mcg Tabs (Levothyroxine sodium) .... Take 1 tablet by mouth once a day 3)  Verapamil Hcl Cr 240 Mg Tbcr (Verapamil hcl) .... Take 1 1/2 tablet by mouth once a day 4)  Aspirin 81 Mg Tbec (Aspirin) .... Once daily 5)  Caltrate 600+d 600-400 Mg-unit Tabs (Calcium carbonate-vitamin d) .... Twice daily 6)  Multivitamins Tabs (Multiple vitamin) .... Once daily 7)  Restasis 0.05 % Emul (Cyclosporine) .... Use two times a day as directed 8)  Vitamin  C 500 Mg Chew (Ascorbic acid) .... Once daily 9)  Tylenol Ex St Arthritis Pain 500 Mg Tabs (Acetaminophen) .... One 5x day 10)  Xyzal 5 Mg Tabs (Levocetirizine dihydrochloride) .... One by mouth daily as needed 11)  Vitamin D3 1000 Unit Caps (Cholecalciferol) .... Once daily  Patient Instructions: 1)  Please schedule a follow-up appointment in 4 months. 2)  labs one week prior to visit 3)  lipids---272.4 4)  lfts-995.2 5)  bmet-995.2 6)  A1C-250.02 7)     Prescriptions: LEVOTHYROXINE SODIUM 88 MCG TABS (LEVOTHYROXINE SODIUM) Take 1 tablet by mouth once a day  #90 x 3   Entered by:   Rica Records, RN   Authorized by:   Phoebe Sharps MD   Signed by:   Rica Records, RN on 01/09/2010   Method used:   Faxed to ...       [facility] (mail-order)       [place]  [address]       Ph: [phone]       Fax: [phone]   RxID:   [id number] VERAPAMIL HCL CR 240 MG TBCR (VERAPAMIL  HCL) Take 1 1/2 tablet by mouth once a day  #150 x 3   Entered by:   Rica Records, RN   Authorized by:   Phoebe Sharps MD   Signed by:   Rica Records, RN on 01/09/2010   Method used:   Faxed to ...       [facility] (mail[facility]order)       [address] [place]  [address]       Ph: [phone]       Fax: [phone]   RxID:   [id number]

## 2011-01-15 NOTE — Assessment & Plan Note (Signed)
Summary: 4 MO ROV/MM   Vital Signs:  Patient profile:   77 year old female Menstrual status:  hysterectomy Weight:      140 pounds BMI:     27.44 Temp:     99.4 degrees F oral Pulse rate:   84 / minute Pulse rhythm:   regular Resp:     12 per minute BP sitting:   126 / 60  (left arm) Cuff size:   regular  Vitals Entered By: Rica Records, RN (May 11, 2010 11:13 AM) CC: 4 month rov, labs done Is Patient Diabetic? Yes Did you bring your meter with you today? No     Menstrual Status hysterectomy   Primary Care Provider:  Lucila Maine  CC:  4 month rov and labs done.  History of Present Illness:  Follow-Up Visit      This is an 77 year old woman who presents for Follow-up visit.  The patient denies chest pain and palpitations.  Since the last visit the patient notes being seen by a specialist.  The patient reports taking meds as prescribed.  When questioned about possible medication side effects, the patient notes none.    sees dr Gracelyn Nurse (aranesp or similar injection)  All other systems reviewed and were negative   Preventive Screening-Counseling & Management  Alcohol-Tobacco     Smoking Status: never  Current Problems (verified): 1)  Colonic Polyps, Hx of  (ICD-V12.72) 2)  Myelodysplastic Syndrome  (ICD-238.75) 3)  Back Pain--scoliosis  (ICD-724.5) 4)  Osteoporosis  (ICD-733.00) 5)  Hypothyroidism  (ICD-244.9) 6)  Hypertension  (ICD-401.9) 7)  Hyperlipidemia  (ICD-272.4) 8)  Diverticulosis, Colon  (ICD-562.10) 9)  Diabetes Mellitus, Type II  (ICD-250.00) 10)  Skin Cancer, Hx of  (ICD-V10.83)  Current Medications (verified): 1)  Benicar 20 Mg Tabs (Olmesartan Medoxomil) .... Take 1 Tablet By Mouth Once A Day 2)  Levothyroxine Sodium 88 Mcg Tabs (Levothyroxine Sodium) .... Take 1 Tablet By Mouth Once A Day 3)  Verapamil Hcl Cr 240 Mg Tbcr (Verapamil Hcl) .... Take 1 1/2 Tablet By Mouth Once A Day 4)  Aspirin 81 Mg  Tbec (Aspirin) .... Once Daily 5)   Caltrate 600+d 600-400 Mg-Unit  Tabs (Calcium Carbonate-Vitamin D) .... Twice Daily 6)  Multivitamins   Tabs (Multiple Vitamin) .... Once Daily 7)  Restasis 0.05 %  Emul (Cyclosporine) .... Use Two Times A Day As Directed 8)  Vitamin C 500 Mg  Chew (Ascorbic Acid) .... Once Daily 9)  Tylenol Ex St Arthritis Pain 500 Mg  Tabs (Acetaminophen) .... One 5x Day 10)  Xyzal 5 Mg Tabs (Levocetirizine Dihydrochloride) .... One By Mouth Daily As Needed 11)  Vitamin D3 1000 Unit Caps (Cholecalciferol) .... Once Daily  Allergies: 1)  Lisinopril (Lisinopril) 2)  Penicillin G Potassium (Penicillin G Potassium)  Past History:  Past Medical History: Last updated: 08/21/2007 Skin cancer, hx of--melanoma X2 Diabetes mellitus, type II Diverticulosis, colon--on colonoscopy Hyperlipidemia Hypertension Hypothyroidism Osteoporosis  Past Surgical History: Last updated: 08/21/2007 lapaoscopic polyp removal Appendectomy Carpal tunnel release excision melanoma  1986,  2005 knee pain Total knee replacement  R 2005,  L  05/05 transfusion 2006 rectocelel repair  Family History: Last updated: 05/11/2010 brother-prostate CA, renal cell ca deceased age 6  Social History: Last updated: 12/22/2007 Married Never Smoked  Risk Factors: Smoking Status: never (05/11/2010)  Family History: brother-prostate CA, renal cell ca deceased age 13  Physical Exam  General:  alert and well-developed.   Head:  normocephalic and atraumatic.  Eyes:  pupils equal and pupils round.   Neck:  No deformities, masses, or tenderness noted. Chest Wall:  No deformities, masses, or tenderness noted. Lungs:  normal respiratory effort and no intercostal retractions.   Heart:  normal rate and regular rhythm.   Abdomen:  soft and non-tender.   Msk:  No deformity or scoliosis noted of thoracic or lumbar spine.   Neurologic:  cranial nerves II-XII intact and gait normal.     Impression & Recommendations:  Problem #  1:  MYELODYSPLASTIC SYNDROME (ICD-238.75)  f/u dr Beryle Beams  Orders: Prescription Created Electronically [phone])  Problem # 2:  HYPOTHYROIDISM (ICD-244.9) will recheck within 6 months or so Her updated medication list for this problem includes:    Levothyroxine Sodium 88 Mcg Tabs (Levothyroxine sodium) .Marland Kitchen... Take 1 tablet by mouth once a day  Labs Reviewed: TSH: 0.65 (12/21/2009)    HgBA1c: 4.8 (05/04/2010) Chol: 205 (05/04/2010)   HDL: 56.10 (05/04/2010)   LDL: 85 (01/02/2009)   TG: 201.0 (05/04/2010)  Problem # 3:  HYPERTENSION (ICD-401.9)  Her updated medication list for this problem includes:    Benicar 20 Mg Tabs (Olmesartan medoxomil) .Marland Kitchen... Take 1 tablet by mouth once a day    Verapamil Hcl Cr 240 Mg Tbcr (Verapamil hcl) .Marland Kitchen... Take 1 1/2 tablet by mouth once a day  BP today: 126/60 Prior BP: 126/60 (01/09/2010)  Labs Reviewed: K+: 4.6 (05/04/2010) Creat: : 1.1 (05/04/2010)   Chol: 205 (05/04/2010)   HDL: 56.10 (05/04/2010)   LDL: 85 (01/02/2009)   TG: 201.0 (05/04/2010)  Problem # 4:  DIABETES MELLITUS, TYPE II (ICD-250.00) previously well controlled  Her updated medication list for this problem includes:    Benicar 20 Mg Tabs (Olmesartan medoxomil) .Marland Kitchen... Take 1 tablet by mouth once a day    Aspirin 81 Mg Tbec (Aspirin) ..... Once daily  Labs Reviewed: Creat: 1.1 (05/04/2010)     Last Eye Exam: normal-pt's report (12/16/2009) Reviewed HgBA1c results: 4.8 (05/04/2010)  5.1 (01/02/2009)  Complete Medication List: 1)  Benicar 20 Mg Tabs (Olmesartan medoxomil) .... Take 1 tablet by mouth once a day 2)  Levothyroxine Sodium 88 Mcg Tabs (Levothyroxine sodium) .... Take 1 tablet by mouth once a day 3)  Verapamil Hcl Cr 240 Mg Tbcr (Verapamil hcl) .... Take 1 1/2 tablet by mouth once a day 4)  Aspirin 81 Mg Tbec (Aspirin) .... Once daily 5)  Caltrate 600+d 600-400 Mg-unit Tabs (Calcium carbonate-vitamin d) .... Twice daily 6)  Multivitamins Tabs (Multiple vitamin)  .... Once daily 7)  Restasis 0.05 % Emul (Cyclosporine) .... Use two times a day as directed 8)  Vitamin C 500 Mg Chew (Ascorbic acid) .... Once daily 9)  Tylenol Ex St Arthritis Pain 500 Mg Tabs (Acetaminophen) .... One 5x day 10)  Xyzal 5 Mg Tabs (Levocetirizine dihydrochloride) .... One by mouth daily as needed 11)  Vitamin D3 1000 Unit Caps (Cholecalciferol) .... Once daily  Patient Instructions: 1)  Please schedule a follow-up appointment in 6 months. medicare wellness visit 2)  labs one week prior to visit 3)  lipids---272.4 4)  lfts-995.2 5)  bmet-995.2 6)  A1C-250.02 7)    8)  tsh -244.9 9)  cbc-diff.  Prescriptions: BENICAR 20 MG TABS (OLMESARTAN MEDOXOMIL) Take 1 tablet by mouth once a day  #90 x 3   Entered and Authorized by:   Phoebe Sharps MD   Signed by:   Phoebe Sharps MD on 05/11/2010   Method used:   Faxed to .[facility]land KitchenMarland Kitchen  [facility] ([facility]       [address] [place]  [address]       Ph: [phone]       Fax: [phone]   RxID:   [id number]

## 2011-01-22 ENCOUNTER — Encounter (HOSPITAL_BASED_OUTPATIENT_CLINIC_OR_DEPARTMENT_OTHER): Payer: Medicare Other | Admitting: Oncology

## 2011-01-22 ENCOUNTER — Other Ambulatory Visit: Payer: Self-pay | Admitting: Oncology

## 2011-01-22 DIAGNOSIS — D6489 Other specified anemias: Secondary | ICD-10-CM

## 2011-01-22 LAB — CBC WITH DIFFERENTIAL/PLATELET
Basophils Absolute: 0 10*3/uL (ref 0.0–0.1)
Eosinophils Absolute: 0.1 10*3/uL (ref 0.0–0.5)
HCT: 35.5 % (ref 34.8–46.6)
HGB: 11.9 g/dL (ref 11.6–15.9)
NEUT#: 3.4 10*3/uL (ref 1.5–6.5)
NEUT%: 66.2 % (ref 38.4–76.8)
RDW: 17.2 % — ABNORMAL HIGH (ref 11.2–14.5)
lymph#: 1.2 10*3/uL (ref 0.9–3.3)

## 2011-02-04 ENCOUNTER — Telehealth: Payer: Self-pay | Admitting: Internal Medicine

## 2011-02-04 DIAGNOSIS — I10 Essential (primary) hypertension: Secondary | ICD-10-CM

## 2011-02-04 MED ORDER — LOSARTAN POTASSIUM 100 MG PO TABS: 100.0000 mg | ORAL_TABLET | Freq: Every day | ORAL | Status: DC

## 2011-02-04 NOTE — Telephone Encounter (Signed)
Pt was on benicar was switch to losartan is this generic for benicar?

## 2011-02-04 NOTE — Telephone Encounter (Signed)
Pt was unaware that she was switched from Benicar to Losartan, this was done in her November OV.  Called in a refill to [facility]

## 2011-02-05 ENCOUNTER — Other Ambulatory Visit: Payer: Self-pay | Admitting: Oncology

## 2011-02-05 ENCOUNTER — Encounter (HOSPITAL_BASED_OUTPATIENT_CLINIC_OR_DEPARTMENT_OTHER): Payer: Medicare Other | Admitting: Oncology

## 2011-02-05 DIAGNOSIS — D6489 Other specified anemias: Secondary | ICD-10-CM

## 2011-02-05 DIAGNOSIS — D4620 Refractory anemia with excess of blasts, unspecified: Secondary | ICD-10-CM

## 2011-02-05 DIAGNOSIS — D47Z9 Other specified neoplasms of uncertain behavior of lymphoid, hematopoietic and related tissue: Secondary | ICD-10-CM

## 2011-02-05 LAB — CBC WITH DIFFERENTIAL/PLATELET
BASO%: 0.1 % (ref 0.0–2.0)
EOS%: 1.6 % (ref 0.0–7.0)
MCH: 28.9 pg (ref 25.1–34.0)
MCHC: 33.2 g/dL (ref 31.5–36.0)
MONO#: 0.2 10*3/uL (ref 0.1–0.9)
NEUT%: 75.8 % (ref 38.4–76.8)
RBC: 3.49 10*6/uL — ABNORMAL LOW (ref 3.70–5.45)
RDW: 16.5 % — ABNORMAL HIGH (ref 11.2–14.5)
WBC: 7.3 10*3/uL (ref 3.9–10.3)
lymph#: 1.4 10*3/uL (ref 0.9–3.3)
nRBC: 0 % (ref 0–0)

## 2011-02-08 ENCOUNTER — Encounter (INDEPENDENT_AMBULATORY_CARE_PROVIDER_SITE_OTHER): Payer: Medicare Other | Admitting: Gynecology

## 2011-02-08 DIAGNOSIS — N952 Postmenopausal atrophic vaginitis: Secondary | ICD-10-CM

## 2011-02-08 DIAGNOSIS — R82998 Other abnormal findings in urine: Secondary | ICD-10-CM

## 2011-02-08 DIAGNOSIS — N8111 Cystocele, midline: Secondary | ICD-10-CM

## 2011-02-15 ENCOUNTER — Other Ambulatory Visit (INDEPENDENT_AMBULATORY_CARE_PROVIDER_SITE_OTHER): Payer: Medicare Other

## 2011-02-15 DIAGNOSIS — R82998 Other abnormal findings in urine: Secondary | ICD-10-CM

## 2011-02-19 ENCOUNTER — Encounter (HOSPITAL_BASED_OUTPATIENT_CLINIC_OR_DEPARTMENT_OTHER): Payer: Medicare Other | Admitting: Oncology

## 2011-02-19 ENCOUNTER — Other Ambulatory Visit: Payer: Self-pay | Admitting: Oncology

## 2011-02-19 DIAGNOSIS — D649 Anemia, unspecified: Secondary | ICD-10-CM

## 2011-02-19 DIAGNOSIS — D47Z9 Other specified neoplasms of uncertain behavior of lymphoid, hematopoietic and related tissue: Secondary | ICD-10-CM

## 2011-02-19 DIAGNOSIS — D4620 Refractory anemia with excess of blasts, unspecified: Secondary | ICD-10-CM

## 2011-02-19 LAB — MORPHOLOGY

## 2011-02-19 LAB — CBC & DIFF AND RETIC
Basophils Absolute: 0 10*3/uL (ref 0.0–0.1)
Eosinophils Absolute: 0.1 10*3/uL (ref 0.0–0.5)
HGB: 11.4 g/dL — ABNORMAL LOW (ref 11.6–15.9)
Immature Retic Fract: 3.6 % (ref 0.00–10.70)
MONO#: 0.4 10*3/uL (ref 0.1–0.9)
NEUT#: 4.2 10*3/uL (ref 1.5–6.5)
RBC: 3.88 10*6/uL (ref 3.70–5.45)
RDW: 17.3 % — ABNORMAL HIGH (ref 11.2–14.5)
Retic %: 1.55 % — ABNORMAL HIGH (ref 0.50–1.50)
Retic Ct Abs: 60.14 10*3/uL (ref 18.30–72.70)
WBC: 5.8 10*3/uL (ref 3.9–10.3)

## 2011-02-19 LAB — FERRITIN: Ferritin: 373 ng/mL — ABNORMAL HIGH (ref 10–291)

## 2011-02-19 LAB — IRON AND TIBC
%SAT: 26 % (ref 20–55)
Iron: 92 ug/dL (ref 42–145)

## 2011-02-25 ENCOUNTER — Telehealth: Payer: Self-pay | Admitting: Internal Medicine

## 2011-02-25 NOTE — Telephone Encounter (Signed)
Pt needs losartan 100mg   call into [facility] [phone] [id number]. This will replace benicar. Pt has a few weeks of benicar left

## 2011-02-26 ENCOUNTER — Encounter (HOSPITAL_BASED_OUTPATIENT_CLINIC_OR_DEPARTMENT_OTHER): Payer: Medicare Other | Admitting: Oncology

## 2011-02-26 ENCOUNTER — Other Ambulatory Visit: Payer: Self-pay | Admitting: *Deleted

## 2011-02-26 ENCOUNTER — Other Ambulatory Visit: Payer: Self-pay | Admitting: Oncology

## 2011-02-26 DIAGNOSIS — D4620 Refractory anemia with excess of blasts, unspecified: Secondary | ICD-10-CM

## 2011-02-26 DIAGNOSIS — D47Z9 Other specified neoplasms of uncertain behavior of lymphoid, hematopoietic and related tissue: Secondary | ICD-10-CM

## 2011-02-26 DIAGNOSIS — I10 Essential (primary) hypertension: Secondary | ICD-10-CM

## 2011-02-26 LAB — CBC WITH DIFFERENTIAL/PLATELET
Basophils Absolute: 0 10*3/uL (ref 0.0–0.1)
EOS%: 1.8 % (ref 0.0–7.0)
HGB: 10.5 g/dL — ABNORMAL LOW (ref 11.6–15.9)
MCH: 29.9 pg (ref 25.1–34.0)
MONO#: 0.4 10*3/uL (ref 0.1–0.9)
NEUT#: 3.4 10*3/uL (ref 1.5–6.5)
RDW: 16.6 % — ABNORMAL HIGH (ref 11.2–14.5)
WBC: 5.1 10*3/uL (ref 3.9–10.3)
lymph#: 1.2 10*3/uL (ref 0.9–3.3)

## 2011-02-26 MED ORDER — LOSARTAN POTASSIUM 100 MG PO TABS: 100.0000 mg | ORAL_TABLET | Freq: Every day | ORAL | Status: DC

## 2011-03-13 ENCOUNTER — Other Ambulatory Visit: Payer: Medicare Other

## 2011-03-19 ENCOUNTER — Encounter (HOSPITAL_BASED_OUTPATIENT_CLINIC_OR_DEPARTMENT_OTHER): Payer: Medicare Other | Admitting: Oncology

## 2011-03-19 ENCOUNTER — Other Ambulatory Visit: Payer: Self-pay | Admitting: Oncology

## 2011-03-19 DIAGNOSIS — D47Z9 Other specified neoplasms of uncertain behavior of lymphoid, hematopoietic and related tissue: Secondary | ICD-10-CM

## 2011-03-19 LAB — CBC WITH DIFFERENTIAL/PLATELET
Basophils Absolute: 0 10*3/uL (ref 0.0–0.1)
Eosinophils Absolute: 0.1 10*3/uL (ref 0.0–0.5)
HCT: 32.1 % — ABNORMAL LOW (ref 34.8–46.6)
HGB: 11 g/dL — ABNORMAL LOW (ref 11.6–15.9)
MCV: 87.4 fL (ref 79.5–101.0)
MONO%: 8.3 % (ref 0.0–14.0)
NEUT#: 2.5 10*3/uL (ref 1.5–6.5)
NEUT%: 59.4 % (ref 38.4–76.8)
RDW: 17.1 % — ABNORMAL HIGH (ref 11.2–14.5)
lymph#: 1.3 10*3/uL (ref 0.9–3.3)

## 2011-04-09 ENCOUNTER — Other Ambulatory Visit: Payer: Self-pay | Admitting: Oncology

## 2011-04-09 ENCOUNTER — Encounter (HOSPITAL_BASED_OUTPATIENT_CLINIC_OR_DEPARTMENT_OTHER): Payer: Medicare Other | Admitting: Oncology

## 2011-04-09 DIAGNOSIS — D4620 Refractory anemia with excess of blasts, unspecified: Secondary | ICD-10-CM

## 2011-04-09 DIAGNOSIS — D47Z9 Other specified neoplasms of uncertain behavior of lymphoid, hematopoietic and related tissue: Secondary | ICD-10-CM

## 2011-04-09 LAB — CBC WITH DIFFERENTIAL/PLATELET
Basophils Absolute: 0 10*3/uL (ref 0.0–0.1)
Eosinophils Absolute: 0.1 10*3/uL (ref 0.0–0.5)
HGB: 9.3 g/dL — ABNORMAL LOW (ref 11.6–15.9)
LYMPH%: 26.5 % (ref 14.0–49.7)
MCV: 85.4 fL (ref 79.5–101.0)
MONO%: 7.1 % (ref 0.0–14.0)
NEUT#: 3.6 10*3/uL (ref 1.5–6.5)
Platelets: 168 10*3/uL (ref 145–400)

## 2011-04-22 ENCOUNTER — Encounter: Payer: Self-pay | Admitting: Internal Medicine

## 2011-04-23 ENCOUNTER — Ambulatory Visit (INDEPENDENT_AMBULATORY_CARE_PROVIDER_SITE_OTHER): Payer: Medicare Other | Admitting: Internal Medicine

## 2011-04-23 ENCOUNTER — Encounter: Payer: Self-pay | Admitting: Internal Medicine

## 2011-04-23 DIAGNOSIS — I10 Essential (primary) hypertension: Secondary | ICD-10-CM

## 2011-04-23 DIAGNOSIS — E039 Hypothyroidism, unspecified: Secondary | ICD-10-CM

## 2011-04-23 DIAGNOSIS — E119 Type 2 diabetes mellitus without complications: Secondary | ICD-10-CM

## 2011-04-23 DIAGNOSIS — D469 Myelodysplastic syndrome, unspecified: Secondary | ICD-10-CM

## 2011-04-23 DIAGNOSIS — M549 Dorsalgia, unspecified: Secondary | ICD-10-CM

## 2011-04-23 DIAGNOSIS — Z23 Encounter for immunization: Secondary | ICD-10-CM

## 2011-04-23 LAB — BASIC METABOLIC PANEL
Calcium: 9.9 mg/dL (ref 8.4–10.5)
Chloride: 109 mEq/L (ref 96–112)
Creatinine, Ser: 0.9 mg/dL (ref 0.4–1.2)

## 2011-04-23 LAB — TSH: TSH: 0.77 u[IU]/mL (ref 0.35–5.50)

## 2011-04-23 LAB — HEMOGLOBIN A1C: Hgb A1c MFr Bld: 5 % (ref 4.6–6.5)

## 2011-04-23 MED ORDER — TRAMADOL HCL 50 MG PO TABS: 50.0000 mg | ORAL_TABLET | Freq: Two times a day (BID) | ORAL | Status: DC | PRN

## 2011-04-23 NOTE — Progress Notes (Signed)
  Subjective:    Patient ID: Brittany [name], female    DOB: 1924/05/20, 77 y.o.   MRN: [id number]  HPI  Patient Active Problem List  Diagnoses  . MYELODYSPLASTIC SYNDROME-followed by dr Beryle Beams  . HYPOTHYROIDISM--needs f/u  . DIABETES MELLITUS, TYPE II---no sxs---needs f/uI  . HYPERLIPIDEMIA--no meds  . HYPERTENSION---tolerating meds   She complains of progressive back pain---uses tylenol Says she has been told that she has DJD and DDD  Wants a handicapped sticker  Past Medical History  Diagnosis Date  . Melanoma     x2  . Diabetes mellitus   . Diverticulosis of colon   . Hyperlipidemia   . Hypothyroidism   . Osteoporosis   . Blood transfusion 2006   Past Surgical History  Procedure Date  . Polypectomy     Laparoscopic  . Appendectomy   . Carpal tunnel release   . Melanoma excision   . Replacement total knee   . Rectocele repair     reports that she has never smoked. She does not have any smokeless tobacco history on file. Her alcohol and drug histories not on file. family history includes Cancer in her brother, father, and mother; Heart disease in her brother; and Prostate cancer in her brother. Allergies  Allergen Reactions  . Lisinopril     REACTION: angioedema  . Penicillins     REACTION: rash     Review of Systems  patient denies chest pain, shortness of breath, orthopnea. Denies lower extremity edema, abdominal pain, change in appetite, change in bowel movements. Patient denies rashes, musculoskeletal complaints. No other specific complaints in a complete review of systems.      Objective:   Physical Exam  Well-developed well-nourished female in no acute distress, elderly. HEENT exam atraumatic, normocephalic, extraocular muscles are intact. Neck is supple. No jugular venous distention no thyromegaly. Chest clear to auscultation without increased work of breathing. Cardiac exam S1 and S2 are regular. Abdominal exam active bowel sounds, soft, nontender.  Extremities no edema. Neurologic exam she is alert without any motor sensory deficits. Gait is broad based        Assessment & Plan:

## 2011-04-23 NOTE — Assessment & Plan Note (Signed)
Discussed at length---will avoid nsaids Trial tramadol

## 2011-04-23 NOTE — Assessment & Plan Note (Signed)
Reasonable control Continue same meds

## 2011-04-23 NOTE — Assessment & Plan Note (Signed)
Has f/u every 3 weeks with hematology

## 2011-04-23 NOTE — Assessment & Plan Note (Signed)
Continue same meds. Check labs today

## 2011-04-30 ENCOUNTER — Other Ambulatory Visit: Payer: Self-pay | Admitting: Oncology

## 2011-04-30 ENCOUNTER — Encounter (HOSPITAL_BASED_OUTPATIENT_CLINIC_OR_DEPARTMENT_OTHER): Payer: Medicare Other | Admitting: Oncology

## 2011-04-30 DIAGNOSIS — D47Z9 Other specified neoplasms of uncertain behavior of lymphoid, hematopoietic and related tissue: Secondary | ICD-10-CM

## 2011-04-30 DIAGNOSIS — D4620 Refractory anemia with excess of blasts, unspecified: Secondary | ICD-10-CM

## 2011-04-30 LAB — CBC WITH DIFFERENTIAL/PLATELET
BASO%: 0.5 % (ref 0.0–2.0)
Basophils Absolute: 0 10*3/uL (ref 0.0–0.1)
Eosinophils Absolute: 0.1 10*3/uL (ref 0.0–0.5)
HCT: 28.4 % — ABNORMAL LOW (ref 34.8–46.6)
HGB: 9.8 g/dL — ABNORMAL LOW (ref 11.6–15.9)
MONO#: 0.5 10*3/uL (ref 0.1–0.9)
NEUT#: 3.8 10*3/uL (ref 1.5–6.5)
NEUT%: 69.9 % (ref 38.4–76.8)
WBC: 5.5 10*3/uL (ref 3.9–10.3)
lymph#: 1.1 10*3/uL (ref 0.9–3.3)

## 2011-04-30 NOTE — Op Note (Signed)
NAME:  Brittany [name], Brittany [name]               ACCOUNT NO.:  [id number]   MEDICAL RECORD NO.:  [id number]          PATIENT TYPE:  OIB   LOCATION:  9318                          FACILITY:  [facility]   PHYSICIAN:  Timothy P. Fontaine, M.D.DATE OF BIRTH:  [birth year]   DATE OF PROCEDURE:  06/09/2007  DATE OF DISCHARGE:                               OPERATIVE REPORT   PREOPERATIVE DIAGNOSES:  Pelvic relaxation with posterior rectocele.   POSTOPERATIVE DIAGNOSES:  Pelvic relaxation with posterior rectocele.   PROCEDURE:  Posterior colporrhaphy, exam under anesthesia.   SURGEON:  Dr. Phineas Real   ASSISTANT:  Dr. Toney Rakes.   ANESTHETIC:  Spinal with IV sedation.   ESTIMATED BLOOD LOSS:  Less than 100 mL.   SPECIMEN:  None.   COMPLICATIONS:  None.   FINDINGS:  EUA; external B U S vagina with large rectocele, mild  cystocele. Bimanual without masses.  Rectovaginal is normal, confirming  rectocele.   PROCEDURE:  The patient was taken to the operating room, underwent  spinal anesthesia, was placed in the dorsal lithotomy position, received  a perineal vaginal preparation with Betadine solution.  After removing  her pessary and estrogen ring this was all washed clean and was to be  given to the patient postoperatively.  After the Betadine cleansing  Foley catheter was placed in sterile technique.  The patient was draped  in usual fashion.  The posterior introital region was identified with  Allis clamps. A sharp transverse incision was made along the posterior  fourchette and through progressive sharp and blunt dissection the  posterior vaginal mucosa was undermined with Metzenbaum scissors and  sharply incised to just below the vaginal cuff region.  The vaginal  mucosa was progressively clamped with successive Allis clamps for  manipulation.  The perirectal tissues were sharply and bluntly dissected  to identify the perirectal spaces bilaterally with reduction of the  rectocele.  The rectocele was  then reduced through progressive 2-0  Vicryl intermittent sutures with a good rectovaginal support achieved.  The excess vaginal mucosa was then excised and the vagina was then  closed using 2-0 Vicryl suture in a running interlocking stitch with  incorporation of the underlying rectovaginal septum to close the dead  space.  A rectal exam was performed to assure rectal integrity  and no sutures within the rectum and subsequently the vagina was packed  using Iodoform gauze.  The patient was placed in the supine position and  was subsequently taken to recovery room in good condition having  tolerated the procedure well.      Timothy P. Fontaine, M.D.  Electronically Signed     TPF/MEDQ  D:  06/09/2007  T:  06/09/2007  Job:  [id number]

## 2011-04-30 NOTE — H&P (Signed)
NAME:  Brittany [name], Brittany [name]               ACCOUNT NO.:  [id number]   MEDICAL RECORD NO.:  [id number]          PATIENT TYPE:  AMB   LOCATION:  SDC                           FACILITY:  [facility]   PHYSICIAN:  Timothy P. Fontaine, M.D.DATE OF BIRTH:  [birth year]   DATE OF ADMISSION:  06/09/2007  DATE OF DISCHARGE:                              HISTORY & PHYSICAL   Being admitted for surgery at 7:30 a.m.   CHIEF COMPLAINT:  Pelvic relaxation with significant rectocele.   HISTORY OF PRESENT ILLNESS:  An 77 year old, G4, P4 female status post  hysterectomy in the past for a prolapse with symptomatic large rectocele  being treated with a pessary and Estring who wants to proceed with  definitive surgical repair.  She is admitted at this time for a  posterior colporrhaphy.   PAST MEDICAL HISTORY:  1. Hypertension.  2. Hypercholesterolemia.  3. Hypothyroidism.  4. Osteoporosis.   CURRENT MEDICATIONS:  Verapamil, Benicar, Synthroid, Boniva, Estring,  multivitamin, calcium and vitamin D, for dosages see her medication  reconciliation sheet.   PAST SURGICAL HISTORY:  1. Total abdominal hysterectomy.  2. Colon polyp excision.  3. Total knee replacements bilaterally.  4. Carpal tunnel syndrome repair, bilaterally.   ALLERGIES:  1. PENICILLIN.  2. LISINOPRIL.   REVIEW OF SYSTEMS:  Noncontributory.   FAMILY HISTORY:  Noncontributory.   SOCIAL HISTORY:  Noncontributory.   ADMISSION PHYSICAL EXAMINATION:  VITAL SIGNS:  Afebrile.  Vital signs  are stable.  HEENT:  Normal.  LUNGS:  Clear.  CARDIAC:  Regular rate without rubs, murmurs, or gallops.  ABDOMEN:  Benign.  PELVIC:  External bus and vagina with mild cystocele, cuff well  supported, large rectocele.  Rectovaginal exam is normal.   ASSESSMENT:  An 77 year old with pelvic relaxation with a significant  rectocele using pessary which has become cumbersome to her and she wants  to proceed with definitive surgery.   I reviewed with the  patient on multiple occasions the proposed surgery,  the expected intraoperative postoperative courses, and the risks of the  surgery.  She understands due to her advancing age, she is at risk of  surgery anesthetic both from a cardiovascular standpoint, stroke, heart  attack, respiratory complications such as pneumonia, DVT, thrombosis,  pulmonary embolus was all discussed with her.  The specific risks of the  surgery to include rectovaginal rectocele repair includes perirectal  tissue damage, rectal injury, the need for resultant repair, possible  colostomy diversion, as well as long term issues with rectovaginal  fistula formation and failure of the procedure with recurrence of her  rectocele.  The risk of infection requiring prolonged antibiotics or  perirectal abscess formation requiring re-operation and drainage were  reviewed as well as the risks of vascular injury, hemorrhage, and the  resulting need for transfusion, were all discussed, understood, and  accepted.  She has a clear realistic understanding of the risks of the  surgery, particularly pertaining to her age, again from an anesthetic  standpoint as well as from a thrombosis standpoint and accepts again the  long term risks of failure of the procedure.  She  has talked to  anesthesia.  They plan on an attempt at a regional anesthetic and we  will plan on intermittent compression hose.  She is status post knee  replacement bilaterally but she is quite ambulatory.  I discussed  possible use of low dose heparin in addition to the intermittent  stockings but due to the anticipated quick ambulation, we will hold on  the heparin but go with the thigh-high intermittent compression  stockings.  The patient's questions were answered to her satisfaction.  She is ready to proceed with surgery.      Timothy P. Fontaine, M.D.  Electronically Signed     TPF/MEDQ  D:  06/08/2007  T:  06/08/2007  Job:  [id number]

## 2011-04-30 NOTE — Discharge Summary (Signed)
NAME:  Marquart, Juanita               ACCOUNT NO.:  [id number]   MEDICAL RECORD NO.:  [id number]          PATIENT TYPE:  OIB   LOCATION:  9318                          FACILITY:  [facility]   PHYSICIAN:  Timothy P. Fontaine, M.D.DATE OF BIRTH:  1924-02-24   DATE OF ADMISSION:  06/09/2007  DATE OF DISCHARGE:  06/10/2007                               DISCHARGE SUMMARY   DISCHARGE DIAGNOSES:  1. Pelvic relaxation.  2. Symptomatic rectocele.   PROCEDURE:  Posterior colporrhaphy June 09, 2007.   PATHOLOGY:  None.   HOSPITAL COURSE:  An 76 year old female with a symptomatic rectocele,  underwent uncomplicated posterior colporrhaphy June 09, 2007.  Procedure  was performed under spinal anesthesia, and she did well postoperatively  and was discharged on postoperative day #1 ambulating well, tolerating a  regular diet.  Her vaginal packing that was placed intraoperatively was  removed the first postoperative day, and her Foley catheter was also  removed on the first postoperative day.  After voiding spontaneously,  she will be discharged home with follow-up instructions and will be seen  in the office in 2 weeks.  Postoperative instructions were reviewed as  well as a.s.a.p. call precautions.  She was given a prescription for  Darvocet-N 100 #20 one to two p.o. q.4-6h. p.r.n. pain.  No  postoperative blood work was done.      Timothy P. Fontaine, M.D.  Electronically Signed     TPF/MEDQ  D:  06/10/2007  T:  06/10/2007  Job:  [id number]

## 2011-05-03 NOTE — Assessment & Plan Note (Signed)
[facility] OFFICE NOTE   NAME:WHITEZeltzin, Kazemi                        MRN:          [id number]  DATE:04/07/2007                            DOB:          01/11/1924    REASON FOR REFERRAL:  Dr. Leanne Chang asked me to evaluate Ms. Fogarty in  consultation regarding personal history of colon polyps.   HISTORY OF PRESENT ILLNESS:  Ms. Eaton is a very pleasant 77 year old  woman who underwent screening colonoscopy in 2004.  She tells me that a  cluster of precancerous polyps were seen in her cecum.  She underwent  lap cecectomy because they probably were not endoscopically removable.  She is very clear that they were not cancerous, and that surgery went  just fine.  She had a repeat colonoscopy 1 year later, June 2005, and  that colonoscopy was normal.  No polyps were seen.  She was recommended  to have followup colonoscopy in 3 years.  Since then, she has had no  troubles with her bowels.  No constipation, no diarrhea, no bleeding.   REVIEW OF SYSTEMS:  Notable for stable weights, otherwise essentially  normal, and is available on her nursing intake sheet.   PAST MEDICAL HISTORY:  1. Hypertension.  2. Elevated cholesterol.  3. Melanoma 1986 and 2003.  4. Lap cecectomy as above.  5. Hysterectomy in 1989.  6. Knee replacement November 2004 and April 2006.  7. Carpal tunnel surgery February 2005.   CURRENT MEDICATIONS:  Verapamil, Synthroid, Estring, multivitamin,  calcium, aspirin, Boniva, vitamin C, acetaminophen, Benicar.   ALLERGIES:  1. PENICILLIN CAUSED A RASH.  2. LISINOPRIL CAUSED SWELLING.   SOCIAL HISTORY:  Married with 4 children.  Worked as a Agricultural engineer.  Nonsmoker.  Nondrinker.   FAMILY HISTORY:  No colon cancer or colon polyps.   PHYSICAL EXAMINATION:  Five feet tall, 146 pounds.  Blood pressure  152/64.  Pulse 64.  CONSTITUTIONAL:  Generally well appearing.  NEUROLOGIC:  Alert and oriented x3.  Eyes:  Extraocular movements intact.  Mouth:  Oropharynx moist.  No  lesions.  NECK:  Supple.  No lymphadenopathy.  CARDIOVASCULAR:  Heart regular rate and rhythm.  LUNGS:  Clear to auscultation bilaterally.  ABDOMEN:  Soft.  Non-tender.  Non-distended.  Normal bowel sounds.  EXTREMITIES:  No lower extremity edema.  SKIN:  No rashes or lesions on visible extremities.   ASSESSMENT AND PLAN:  An 77 year old woman with personal history of  colon polyps.  Last colonoscopy was in 2005, and it was normal.   Current colorectal cancer screening guidelines and polyp surveillance  guidelines recommend for a personal history of colon polyps, she should  have colonoscopy at least every 5 years.  She had a normal exam 5 in  2005, and I think it is reasonable to repeat the colonoscopy in 2010.  We will put her in our reminder system, and get her back in at that time  if she is otherwise clinically well.     Milus Banister, MD  Electronically Signed    DPJ/MedQ  DD: 04/07/2007  DT: 04/07/2007  Job #: [id number]   cc:   Darrick Penna. Swords, MD

## 2011-05-24 ENCOUNTER — Encounter: Payer: Self-pay | Admitting: Internal Medicine

## 2011-05-30 ENCOUNTER — Other Ambulatory Visit: Payer: Self-pay | Admitting: Oncology

## 2011-05-30 ENCOUNTER — Encounter (HOSPITAL_BASED_OUTPATIENT_CLINIC_OR_DEPARTMENT_OTHER): Payer: Medicare Other | Admitting: Oncology

## 2011-05-30 DIAGNOSIS — D47Z9 Other specified neoplasms of uncertain behavior of lymphoid, hematopoietic and related tissue: Secondary | ICD-10-CM

## 2011-05-30 DIAGNOSIS — D4620 Refractory anemia with excess of blasts, unspecified: Secondary | ICD-10-CM

## 2011-05-30 LAB — CBC WITH DIFFERENTIAL/PLATELET
Basophils Absolute: 0 10*3/uL (ref 0.0–0.1)
EOS%: 2.4 % (ref 0.0–7.0)
HCT: 29.5 % — ABNORMAL LOW (ref 34.8–46.6)
HGB: 10.1 g/dL — ABNORMAL LOW (ref 11.6–15.9)
MCH: 29.9 pg (ref 25.1–34.0)
MCV: 87.8 fL (ref 79.5–101.0)
NEUT%: 62 % (ref 38.4–76.8)
Platelets: 215 10*3/uL (ref 145–400)
lymph#: 1.3 10*3/uL (ref 0.9–3.3)

## 2011-06-11 ENCOUNTER — Other Ambulatory Visit: Payer: Self-pay | Admitting: Oncology

## 2011-06-11 ENCOUNTER — Encounter (HOSPITAL_BASED_OUTPATIENT_CLINIC_OR_DEPARTMENT_OTHER): Payer: Medicare Other | Admitting: Oncology

## 2011-06-11 DIAGNOSIS — D47Z9 Other specified neoplasms of uncertain behavior of lymphoid, hematopoietic and related tissue: Secondary | ICD-10-CM

## 2011-06-11 DIAGNOSIS — D4620 Refractory anemia with excess of blasts, unspecified: Secondary | ICD-10-CM

## 2011-06-11 LAB — IRON AND TIBC
%SAT: 38 % (ref 20–55)
Iron: 128 ug/dL (ref 42–145)
TIBC: 339 ug/dL (ref 250–470)
UIBC: 211 ug/dL

## 2011-06-11 LAB — CBC & DIFF AND RETIC
Basophils Absolute: 0 10*3/uL (ref 0.0–0.1)
EOS%: 2.2 % (ref 0.0–7.0)
HGB: 10.7 g/dL — ABNORMAL LOW (ref 11.6–15.9)
MCH: 28.7 pg (ref 25.1–34.0)
MCV: 86.6 fL (ref 79.5–101.0)
MONO%: 7.9 % (ref 0.0–14.0)
NEUT%: 65.4 % (ref 38.4–76.8)
RDW: 17.2 % — ABNORMAL HIGH (ref 11.2–14.5)

## 2011-06-11 LAB — MORPHOLOGY: PLT EST: ADEQUATE

## 2011-06-27 ENCOUNTER — Encounter (HOSPITAL_BASED_OUTPATIENT_CLINIC_OR_DEPARTMENT_OTHER): Payer: Medicare Other | Admitting: Oncology

## 2011-06-27 ENCOUNTER — Other Ambulatory Visit: Payer: Self-pay | Admitting: Oncology

## 2011-06-27 DIAGNOSIS — D649 Anemia, unspecified: Secondary | ICD-10-CM

## 2011-06-27 DIAGNOSIS — D47Z9 Other specified neoplasms of uncertain behavior of lymphoid, hematopoietic and related tissue: Secondary | ICD-10-CM

## 2011-06-27 DIAGNOSIS — D6489 Other specified anemias: Secondary | ICD-10-CM

## 2011-06-27 LAB — CBC WITH DIFFERENTIAL/PLATELET
BASO%: 0.5 % (ref 0.0–2.0)
LYMPH%: 25.3 % (ref 14.0–49.7)
MCHC: 33.9 g/dL (ref 31.5–36.0)
MONO#: 0.5 10*3/uL (ref 0.1–0.9)
Platelets: 196 10*3/uL (ref 145–400)
RBC: 3.16 10*6/uL — ABNORMAL LOW (ref 3.70–5.45)
RDW: 15.8 % — ABNORMAL HIGH (ref 11.2–14.5)
WBC: 5.4 10*3/uL (ref 3.9–10.3)
lymph#: 1.4 10*3/uL (ref 0.9–3.3)

## 2011-07-08 ENCOUNTER — Other Ambulatory Visit: Payer: Self-pay | Admitting: Internal Medicine

## 2011-07-25 ENCOUNTER — Encounter (HOSPITAL_BASED_OUTPATIENT_CLINIC_OR_DEPARTMENT_OTHER): Payer: Medicare Other | Admitting: Oncology

## 2011-07-25 ENCOUNTER — Other Ambulatory Visit: Payer: Self-pay | Admitting: Oncology

## 2011-07-25 DIAGNOSIS — D6489 Other specified anemias: Secondary | ICD-10-CM

## 2011-07-25 LAB — CBC WITH DIFFERENTIAL/PLATELET
BASO%: 0.6 % (ref 0.0–2.0)
HCT: 31.7 % — ABNORMAL LOW (ref 34.8–46.6)
MCHC: 34.3 g/dL (ref 31.5–36.0)
MONO#: 0.4 10*3/uL (ref 0.1–0.9)
NEUT%: 68.3 % (ref 38.4–76.8)
WBC: 5.2 10*3/uL (ref 3.9–10.3)
lymph#: 1.2 10*3/uL (ref 0.9–3.3)

## 2011-08-22 ENCOUNTER — Encounter (HOSPITAL_BASED_OUTPATIENT_CLINIC_OR_DEPARTMENT_OTHER): Payer: Medicare Other | Admitting: Oncology

## 2011-08-22 ENCOUNTER — Other Ambulatory Visit: Payer: Self-pay | Admitting: Oncology

## 2011-08-22 DIAGNOSIS — D6489 Other specified anemias: Secondary | ICD-10-CM

## 2011-08-22 DIAGNOSIS — D649 Anemia, unspecified: Secondary | ICD-10-CM

## 2011-08-22 DIAGNOSIS — D47Z9 Other specified neoplasms of uncertain behavior of lymphoid, hematopoietic and related tissue: Secondary | ICD-10-CM

## 2011-08-22 LAB — CBC WITH DIFFERENTIAL/PLATELET
Eosinophils Absolute: 0.1 10*3/uL (ref 0.0–0.5)
HCT: 28.3 % — ABNORMAL LOW (ref 34.8–46.6)
LYMPH%: 29.2 % (ref 14.0–49.7)
MONO#: 0.3 10*3/uL (ref 0.1–0.9)
NEUT#: 2.5 10*3/uL (ref 1.5–6.5)
NEUT%: 60.2 % (ref 38.4–76.8)
Platelets: 182 10*3/uL (ref 145–400)
WBC: 4.2 10*3/uL (ref 3.9–10.3)
lymph#: 1.2 10*3/uL (ref 0.9–3.3)

## 2011-09-06 LAB — CROSSMATCH
ABO/RH(D): O POS
ABO/RH(D): O POS
Antibody Screen: NEGATIVE

## 2011-09-06 LAB — ABO/RH: ABO/RH(D): O POS

## 2011-09-19 ENCOUNTER — Other Ambulatory Visit: Payer: Self-pay | Admitting: Oncology

## 2011-09-19 ENCOUNTER — Encounter (HOSPITAL_BASED_OUTPATIENT_CLINIC_OR_DEPARTMENT_OTHER): Payer: Medicare Other | Admitting: Oncology

## 2011-09-19 DIAGNOSIS — D6489 Other specified anemias: Secondary | ICD-10-CM

## 2011-09-19 LAB — CBC WITH DIFFERENTIAL/PLATELET
Basophils Absolute: 0 10*3/uL (ref 0.0–0.1)
EOS%: 2.3 % (ref 0.0–7.0)
Eosinophils Absolute: 0.1 10*3/uL (ref 0.0–0.5)
HCT: 29.4 % — ABNORMAL LOW (ref 34.8–46.6)
HGB: 10.2 g/dL — ABNORMAL LOW (ref 11.6–15.9)
MCH: 30.4 pg (ref 25.1–34.0)
MCV: 87.3 fL (ref 79.5–101.0)
MONO%: 5.9 % (ref 0.0–14.0)
NEUT#: 3.3 10*3/uL (ref 1.5–6.5)
NEUT%: 69.4 % (ref 38.4–76.8)
RDW: 16.5 % — ABNORMAL HIGH (ref 11.2–14.5)

## 2011-10-02 LAB — CBC
Hemoglobin: 9.8 — ABNORMAL LOW
MCHC: 34.3
MCV: 92
RBC: 3.11 — ABNORMAL LOW
RDW: 14.6 — ABNORMAL HIGH

## 2011-10-02 LAB — COMPREHENSIVE METABOLIC PANEL
CO2: 26
Calcium: 9.9
Creatinine, Ser: 1.04
GFR calc Af Amer: >60
GFR calc non Af Amer: 51 — ABNORMAL LOW
Glucose, Bld: 135 — ABNORMAL HIGH
Total Protein: 6.6

## 2011-10-02 LAB — TSH: TSH: 0.947

## 2011-10-08 ENCOUNTER — Encounter (HOSPITAL_BASED_OUTPATIENT_CLINIC_OR_DEPARTMENT_OTHER): Payer: Medicare Other | Admitting: Oncology

## 2011-10-08 ENCOUNTER — Other Ambulatory Visit: Payer: Self-pay | Admitting: Oncology

## 2011-10-08 DIAGNOSIS — D4620 Refractory anemia with excess of blasts, unspecified: Secondary | ICD-10-CM

## 2011-10-08 DIAGNOSIS — D47Z9 Other specified neoplasms of uncertain behavior of lymphoid, hematopoietic and related tissue: Secondary | ICD-10-CM

## 2011-10-08 DIAGNOSIS — E039 Hypothyroidism, unspecified: Secondary | ICD-10-CM

## 2011-10-08 DIAGNOSIS — D649 Anemia, unspecified: Secondary | ICD-10-CM

## 2011-10-08 LAB — CBC WITH DIFFERENTIAL/PLATELET
Basophils Absolute: 0 10*3/uL (ref 0.0–0.1)
EOS%: 2.9 % (ref 0.0–7.0)
Eosinophils Absolute: 0.1 10*3/uL (ref 0.0–0.5)
HCT: 26.7 % — ABNORMAL LOW (ref 34.8–46.6)
HGB: 9.2 g/dL — ABNORMAL LOW (ref 11.6–15.9)
MONO#: 0.5 10*3/uL (ref 0.1–0.9)
NEUT#: 3.3 10*3/uL (ref 1.5–6.5)
RDW: 16.4 % — ABNORMAL HIGH (ref 11.2–14.5)
WBC: 5 10*3/uL (ref 3.9–10.3)
lymph#: 1.1 10*3/uL (ref 0.9–3.3)

## 2011-10-08 LAB — IRON AND TIBC
%SAT: 38 % (ref 20–55)
Iron: 132 ug/dL (ref 42–145)
TIBC: 344 ug/dL (ref 250–470)

## 2011-10-08 LAB — COMPREHENSIVE METABOLIC PANEL
BUN: 28 mg/dL — ABNORMAL HIGH (ref 6–23)
CO2: 24 mEq/L (ref 19–32)
Creatinine, Ser: 0.99 mg/dL (ref 0.50–1.10)
Glucose, Bld: 106 mg/dL — ABNORMAL HIGH (ref 70–99)
Total Bilirubin: 0.7 mg/dL (ref 0.3–1.2)

## 2011-10-08 LAB — LACTATE DEHYDROGENASE: LDH: 141 U/L (ref 94–250)

## 2011-11-11 ENCOUNTER — Other Ambulatory Visit: Payer: Self-pay | Admitting: Oncology

## 2011-11-11 ENCOUNTER — Other Ambulatory Visit: Payer: Self-pay | Admitting: Internal Medicine

## 2011-11-11 DIAGNOSIS — D469 Myelodysplastic syndrome, unspecified: Secondary | ICD-10-CM

## 2011-11-12 ENCOUNTER — Ambulatory Visit (HOSPITAL_BASED_OUTPATIENT_CLINIC_OR_DEPARTMENT_OTHER): Payer: Medicare Other

## 2011-11-12 ENCOUNTER — Other Ambulatory Visit: Payer: Self-pay | Admitting: Oncology

## 2011-11-12 ENCOUNTER — Other Ambulatory Visit: Payer: Self-pay | Admitting: Internal Medicine

## 2011-11-12 ENCOUNTER — Other Ambulatory Visit (HOSPITAL_BASED_OUTPATIENT_CLINIC_OR_DEPARTMENT_OTHER): Payer: Medicare Other | Admitting: Lab

## 2011-11-12 VITALS — BP 148/68 | HR 82 | Temp 97.7°F

## 2011-11-12 DIAGNOSIS — D47Z9 Other specified neoplasms of uncertain behavior of lymphoid, hematopoietic and related tissue: Secondary | ICD-10-CM

## 2011-11-12 DIAGNOSIS — D649 Anemia, unspecified: Secondary | ICD-10-CM

## 2011-11-12 DIAGNOSIS — D469 Myelodysplastic syndrome, unspecified: Secondary | ICD-10-CM

## 2011-11-12 LAB — CBC WITH DIFFERENTIAL/PLATELET
BASO%: 0.6 % (ref 0.0–2.0)
Basophils Absolute: 0 10*3/uL (ref 0.0–0.1)
HCT: 30.3 % — ABNORMAL LOW (ref 34.8–46.6)
HGB: 9.9 g/dL — ABNORMAL LOW (ref 11.6–15.9)
LYMPH%: 24.7 % (ref 14.0–49.7)
MCH: 28.8 pg (ref 25.1–34.0)
MCHC: 32.5 g/dL (ref 31.5–36.0)
MONO#: 0.3 10*3/uL (ref 0.1–0.9)
NEUT%: 65.9 % (ref 38.4–76.8)
Platelets: 219 10*3/uL (ref 145–400)
WBC: 5.2 10*3/uL (ref 3.9–10.3)
lymph#: 1.3 10*3/uL (ref 0.9–3.3)

## 2011-11-12 MED ORDER — DARBEPOETIN ALFA-POLYSORBATE 500 MCG/ML IJ SOLN
300.0000 ug | Freq: Once | INTRAMUSCULAR | Status: AC
  Administered 2011-11-12: 300 ug via SUBCUTANEOUS
  Filled 2011-11-12: qty 1

## 2011-11-14 ENCOUNTER — Other Ambulatory Visit: Payer: Medicare Other | Admitting: Lab

## 2011-11-14 ENCOUNTER — Ambulatory Visit: Payer: Medicare Other

## 2011-11-20 ENCOUNTER — Ambulatory Visit (INDEPENDENT_AMBULATORY_CARE_PROVIDER_SITE_OTHER): Payer: Medicare Other | Admitting: Internal Medicine

## 2011-11-20 ENCOUNTER — Encounter: Payer: Self-pay | Admitting: Internal Medicine

## 2011-11-20 DIAGNOSIS — D469 Myelodysplastic syndrome, unspecified: Secondary | ICD-10-CM

## 2011-11-20 DIAGNOSIS — E785 Hyperlipidemia, unspecified: Secondary | ICD-10-CM

## 2011-11-20 DIAGNOSIS — I10 Essential (primary) hypertension: Secondary | ICD-10-CM

## 2011-11-20 NOTE — Assessment & Plan Note (Signed)
Reasonable control Reviewed labs  BP Readings from Last 3 Encounters:  11/20/11 138/62  11/12/11 148/68  04/23/11 130/68

## 2011-11-20 NOTE — Assessment & Plan Note (Signed)
Has regular f/u---sees dr. Beryle Beams CBC:    Component Value Date/Time   WBC 5.2 11/12/2011 1140   WBC 4.8 10/16/2010 1017   HGB 9.9* 11/12/2011 1140   HGB 10.4* 10/16/2010 1017   HCT 30.3* 11/12/2011 1140   HCT 30.0* 10/16/2010 1017   PLT 219 11/12/2011 1140   PLT 174.0 10/16/2010 1017   MCV 88.4 11/12/2011 1140   MCV 89.3 10/16/2010 1017   NEUTROABS 3.4 11/12/2011 1140   NEUTROABS 2.9 10/16/2010 1017   LYMPHSABS 1.3 11/12/2011 1140   LYMPHSABS 1.4 10/16/2010 1017   MONOABS 0.3 11/12/2011 1140   MONOABS 0.4 10/16/2010 1017   EOSABS 0.1 11/12/2011 1140   EOSABS 0.1 10/16/2010 1017   BASOSABS 0.0 11/12/2011 1140   BASOSABS 0.0 10/16/2010 1017

## 2011-11-20 NOTE — Assessment & Plan Note (Signed)
No medications--no need to treat Lab Results  Component Value Date   CHOL 162 10/16/2010   CHOL 205* 05/04/2010   CHOL 165 01/02/2009   Lab Results  Component Value Date   HDL 51.10 10/16/2010   HDL 56.10 05/04/2010   HDL 53.5 01/02/2009   Lab Results  Component Value Date   LDLCALC 77 10/16/2010   LDLCALC 85 01/02/2009   LDLCALC 91 12/22/2007   Lab Results  Component Value Date   TRIG 171.0* 10/16/2010   TRIG 201.0* 05/04/2010   TRIG 132 01/02/2009   Lab Results  Component Value Date   CHOLHDL 3 10/16/2010   CHOLHDL 4 05/04/2010   CHOLHDL 3.1 CALC 01/02/2009   Lab Results  Component Value Date   LDLDIRECT 101.5 05/04/2010   LDLDIRECT 82.3 06/14/2008

## 2011-11-24 NOTE — Progress Notes (Signed)
Patient ID: Brittany [name], female   DOB: 21-Sep-1924, 77 y.o.   MRN: [id number] Patient Active Problem List  Diagnoses  . MYELODYSPLASTIC SYNDROME-patient follows up with hematology regularly.   Marland Kitchen HYPOTHYROIDISM--tolerating replacement therapy without difficulty.   Marland Kitchen DIABETES MELLITUS, TYPE II--currently not on any medications.   Marland Kitchen HYPERLIPIDEMIA--currently not on any medications.   Marland Kitchen HYPERTENSION--no home blood pressures but she is tolerating medications without difficulty.    Past Medical History  Diagnosis Date  . Melanoma     x2  . Diabetes mellitus   . Diverticulosis of colon   . Hyperlipidemia   . Hypothyroidism   . Osteoporosis   . Blood transfusion 2006    History   Social History  . Marital Status: Married    Spouse Name: N/A    Number of Children: N/A  . Years of Education: N/A   Occupational History  . Not on file.   Social History Main Topics  . Smoking status: Never Smoker   . Smokeless tobacco: Not on file  . Alcohol Use:   . Drug Use:   . Sexually Active:    Other Topics Concern  . Not on file   Social History Narrative  . No narrative on file    Past Surgical History  Procedure Date  . Polypectomy     Laparoscopic  . Appendectomy   . Carpal tunnel release   . Melanoma excision   . Replacement total knee   . Rectocele repair     Family History  Problem Relation Age of Onset  . Prostate cancer Brother   . Cancer Brother     renal cell carcinoma  . Heart disease Brother   . Cancer Mother     breast cancer  . Cancer Father     unknown kind    Allergies  Allergen Reactions  . Lisinopril     REACTION: angioedema  . Penicillins     REACTION: rash    Current Outpatient Prescriptions on File Prior to Visit  Medication Sig Dispense Refill  . Ascorbic Acid (VITAMIN C) 500 MG tablet Take 500 mg by mouth daily.        Marland Kitchen aspirin 81 MG tablet Take 81 mg by mouth daily.        . Calcium Carbonate-Vit D-Min (CALTRATE 600+D PLUS) 600-400  MG-UNIT per tablet Take 1 tablet by mouth daily.        . cycloSPORINE (RESTASIS) 0.05 % ophthalmic emulsion Place 1 drop into both eyes 2 (two) times daily.        Marland Kitchen levothyroxine (SYNTHROID, LEVOTHROID) 88 MCG tablet Take 88 mcg by mouth daily.        Marland Kitchen losartan (COZAAR) 100 MG tablet Take 1 tablet (100 mg total) by mouth daily.  90 tablet  3  . Multiple Vitamin (MULTIVITAMIN) tablet Take 1 tablet by mouth daily.        . traMADol (ULTRAM) 50 MG tablet TAKE 1 TABLET (50 MG TOTAL) BY MOUTH 2 (TWO) TIMES DAILY AS NEEDED FOR PAIN.  40 tablet  2  . verapamil (CALAN-SR) 240 MG CR tablet Take 240 mg by mouth at bedtime.           patient denies chest pain, shortness of breath, orthopnea. Denies lower extremity edema, abdominal pain, change in appetite, change in bowel movements. Patient denies rashes, musculoskeletal complaints. No other specific complaints in a complete review of systems.   BP 138/62  Pulse 100  Temp(Src) 98.1 F (36.7 C) (  Oral)  Wt 143 lb (64.864 kg)  Well-developed well-nourished female in no acute distress. HEENT exam atraumatic, normocephalic, extraocular muscles are intact. Neck is supple. No jugular venous distention no thyromegaly. Chest clear to auscultation without increased work of breathing. Cardiac exam S1 and S2 are regular. Abdominal exam active bowel sounds, soft, nontender.

## 2011-11-25 ENCOUNTER — Other Ambulatory Visit: Payer: Self-pay | Admitting: Internal Medicine

## 2011-12-11 ENCOUNTER — Other Ambulatory Visit: Payer: Self-pay | Admitting: Oncology

## 2011-12-11 ENCOUNTER — Other Ambulatory Visit (HOSPITAL_BASED_OUTPATIENT_CLINIC_OR_DEPARTMENT_OTHER): Payer: Medicare Other | Admitting: Lab

## 2011-12-11 ENCOUNTER — Ambulatory Visit (HOSPITAL_BASED_OUTPATIENT_CLINIC_OR_DEPARTMENT_OTHER): Payer: Medicare Other

## 2011-12-11 VITALS — BP 133/77 | HR 79 | Temp 97.2°F

## 2011-12-11 DIAGNOSIS — D4620 Refractory anemia with excess of blasts, unspecified: Secondary | ICD-10-CM

## 2011-12-11 DIAGNOSIS — D469 Myelodysplastic syndrome, unspecified: Secondary | ICD-10-CM

## 2011-12-11 DIAGNOSIS — D47Z9 Other specified neoplasms of uncertain behavior of lymphoid, hematopoietic and related tissue: Secondary | ICD-10-CM

## 2011-12-11 LAB — CBC WITH DIFFERENTIAL/PLATELET
Eosinophils Absolute: 0.1 10*3/uL (ref 0.0–0.5)
HCT: 31.5 % — ABNORMAL LOW (ref 34.8–46.6)
LYMPH%: 18.2 % (ref 14.0–49.7)
MCV: 88.1 fL (ref 79.5–101.0)
MONO%: 6.8 % (ref 0.0–14.0)
NEUT#: 5.2 10*3/uL (ref 1.5–6.5)
NEUT%: 72.9 % (ref 38.4–76.8)
Platelets: 200 10*3/uL (ref 145–400)
RBC: 3.58 10*6/uL — ABNORMAL LOW (ref 3.70–5.45)

## 2011-12-11 MED ORDER — DARBEPOETIN ALFA-POLYSORBATE 500 MCG/ML IJ SOLN
300.0000 ug | Freq: Once | INTRAMUSCULAR | Status: AC
  Administered 2011-12-11: 300 ug via SUBCUTANEOUS
  Filled 2011-12-11: qty 1

## 2011-12-17 DIAGNOSIS — I639 Cerebral infarction, unspecified: Secondary | ICD-10-CM

## 2011-12-17 HISTORY — DX: Cerebral infarction, unspecified: I63.9

## 2011-12-30 ENCOUNTER — Other Ambulatory Visit: Payer: Self-pay | Admitting: *Deleted

## 2011-12-30 DIAGNOSIS — I10 Essential (primary) hypertension: Secondary | ICD-10-CM

## 2011-12-30 MED ORDER — LOSARTAN POTASSIUM 100 MG PO TABS: 100.0000 mg | ORAL_TABLET | Freq: Every day | ORAL | Status: DC

## 2012-01-07 ENCOUNTER — Ambulatory Visit (HOSPITAL_BASED_OUTPATIENT_CLINIC_OR_DEPARTMENT_OTHER): Payer: Medicare Other

## 2012-01-07 ENCOUNTER — Other Ambulatory Visit: Payer: Medicare Other | Admitting: Lab

## 2012-01-07 VITALS — BP 133/71 | HR 98 | Temp 98.2°F

## 2012-01-07 DIAGNOSIS — D469 Myelodysplastic syndrome, unspecified: Secondary | ICD-10-CM

## 2012-01-07 LAB — CBC WITH DIFFERENTIAL/PLATELET
BASO%: 0.6 % (ref 0.0–2.0)
EOS%: 2.2 % (ref 0.0–7.0)
MCH: 30 pg (ref 25.1–34.0)
MCHC: 34.2 g/dL (ref 31.5–36.0)
MONO%: 4.9 % (ref 0.0–14.0)
RBC: 3.62 10*6/uL — ABNORMAL LOW (ref 3.70–5.45)
RDW: 16.4 % — ABNORMAL HIGH (ref 11.2–14.5)
lymph#: 1.2 10*3/uL (ref 0.9–3.3)

## 2012-01-07 MED ORDER — DARBEPOETIN ALFA-POLYSORBATE 300 MCG/0.6ML IJ SOLN
300.0000 ug | Freq: Once | INTRAMUSCULAR | Status: AC
  Administered 2012-01-07: 300 ug via SUBCUTANEOUS
  Filled 2012-01-07: qty 1

## 2012-01-30 DIAGNOSIS — D239 Other benign neoplasm of skin, unspecified: Secondary | ICD-10-CM | POA: Diagnosis not present

## 2012-01-30 DIAGNOSIS — Z85828 Personal history of other malignant neoplasm of skin: Secondary | ICD-10-CM | POA: Diagnosis not present

## 2012-02-04 ENCOUNTER — Ambulatory Visit (HOSPITAL_BASED_OUTPATIENT_CLINIC_OR_DEPARTMENT_OTHER): Payer: Medicare Other | Admitting: Oncology

## 2012-02-04 ENCOUNTER — Encounter: Payer: Self-pay | Admitting: Oncology

## 2012-02-04 ENCOUNTER — Other Ambulatory Visit: Payer: Medicare Other | Admitting: Lab

## 2012-02-04 ENCOUNTER — Telehealth: Payer: Self-pay | Admitting: Oncology

## 2012-02-04 DIAGNOSIS — D469 Myelodysplastic syndrome, unspecified: Secondary | ICD-10-CM

## 2012-02-04 DIAGNOSIS — D649 Anemia, unspecified: Secondary | ICD-10-CM | POA: Diagnosis not present

## 2012-02-04 DIAGNOSIS — D47Z9 Other specified neoplasms of uncertain behavior of lymphoid, hematopoietic and related tissue: Secondary | ICD-10-CM | POA: Diagnosis not present

## 2012-02-04 DIAGNOSIS — D4620 Refractory anemia with excess of blasts, unspecified: Secondary | ICD-10-CM | POA: Diagnosis not present

## 2012-02-04 DIAGNOSIS — IMO0001 Reserved for inherently not codable concepts without codable children: Secondary | ICD-10-CM

## 2012-02-04 DIAGNOSIS — D471 Chronic myeloproliferative disease: Secondary | ICD-10-CM

## 2012-02-04 HISTORY — DX: Anemia, unspecified: D64.9

## 2012-02-04 HISTORY — DX: Reserved for inherently not codable concepts without codable children: IMO0001

## 2012-02-04 LAB — COMPREHENSIVE METABOLIC PANEL
AST: 19 U/L (ref 0–37)
Albumin: 4.5 g/dL (ref 3.5–5.2)
BUN: 29 mg/dL — ABNORMAL HIGH (ref 6–23)
Calcium: 10 mg/dL (ref 8.4–10.5)
Chloride: 106 mEq/L (ref 96–112)
Potassium: 4.7 mEq/L (ref 3.5–5.3)
Sodium: 140 mEq/L (ref 135–145)
Total Protein: 6.2 g/dL (ref 6.0–8.3)

## 2012-02-04 LAB — CBC WITH DIFFERENTIAL/PLATELET
Basophils Absolute: 0 10*3/uL (ref 0.0–0.1)
EOS%: 2 % (ref 0.0–7.0)
Eosinophils Absolute: 0.1 10*3/uL (ref 0.0–0.5)
HGB: 11.1 g/dL — ABNORMAL LOW (ref 11.6–15.9)
NEUT#: 4.5 10*3/uL (ref 1.5–6.5)
RBC: 3.76 10*6/uL (ref 3.70–5.45)
RDW: 16.2 % — ABNORMAL HIGH (ref 11.2–14.5)
lymph#: 1.4 10*3/uL (ref 0.9–3.3)

## 2012-02-04 NOTE — Progress Notes (Signed)
Hematology and Oncology Follow Up Visit  Brittany [name] [id number] [birth year] 78 y.o. 02/04/2012 8:13 PM   Principle Diagnosis: Encounter Diagnoses  Name Primary?  . Myeloproliferative disorder   . Normochromic normocytic anemia      Interim History:   Followup visit for this pleasant 78 year old woman with a myeloproliferative disorder with dysplastic features. She was diagnosed in May of 2008 when she presented with unexplained normochromic anemia. Bone marrow aspiration and biopsy was done on 04/23/2007. Marrow was hypercellular for age 67-80% of maturation was normal in all 3 cell lines and they'll were only 1% blasts. Cytogenetics were normal including FISH studies to look at chromosome 5 and chromosome 7 deletions which were not present. Hemoglobin was 9.7 at the time with MCV 90, Hemp count 4700, and platelets 162,000. There were no ringed sideroblasts. She was initially followed with observation alone until hemoglobin fell to 8.6 by June of 2010 her age she was started on a trial of Aranesp and had a nice response with rise in hemoglobin up to 12.5 g. She is currently on a dose of 300 mcg every 4 weeks and we have been able to maintain her hemoglobin at 10-11 g.  She's had no interim medical problems.   Medications: reviewed  Allergies:  Allergies  Allergen Reactions  . Lisinopril     REACTION: angioedema  . Penicillins     REACTION: rash    Review of Systems: Constitutional:   No constitutional symptoms Respiratory: No cough or dyspnea Cardiovascular:  No chest pain or palpitations Gastrointestinal: No abdominal pain change in bowel habit hematochezia or melena Genito-Urinary: No urinary tract symptoms no vaginal bleeding Musculoskeletal: No muscle or bone pain other than arthritic pain Neurologic: No headache or change in vision Skin: No rash or ecchymosis Remaining ROS negative.  Physical Exam: Blood pressure 157/83, pulse 83, temperature 97.4 F (36.3 C),  temperature source Oral, height 5' (1.524 m), weight 146 lb 3.2 oz (66.316 kg). Wt Readings from Last 3 Encounters:  02/04/12 146 lb 3.2 oz (66.316 kg)  11/20/11 143 lb (64.864 kg)  04/23/11 142 lb (64.411 kg)     General appearance: Well-nourished Caucasian woman HENNT: Pupils equal reactive to light pharynx no erythema or exudate Lymph nodes: No lymphadenopathy Breasts: Not examined Lungs: Clear to auscultation resonant to percussion Heart: Regular cardiac rhythm no murmur Abdomen: Soft nontender no mass no organomegaly Extremities: No edema no calf tenderness Vascular: No cyanosis Neurologic: No focal deficit Skin: No rash or ecchymosis  Lab Results: Lab Results  Component Value Date   WBC 6.6 02/04/2012   HGB 11.1* 02/04/2012   HCT 32.7* 02/04/2012   MCV 86.9 02/04/2012   PLT 194 02/04/2012     Chemistry      Component Value Date/Time   NA 140 02/04/2012 1042   K 4.7 02/04/2012 1042   CL 106 02/04/2012 1042   CO2 24 02/04/2012 1042   BUN 29* 02/04/2012 1042   CREATININE 1.06 02/04/2012 1042      Component Value Date/Time   CALCIUM 10.0 02/04/2012 1042   ALKPHOS 70 02/04/2012 1042   AST 19 02/04/2012 1042   ALT 24 02/04/2012 1042   BILITOT 0.6 02/04/2012 1042       Impression and Plan: #1. Low-risk myeloproliferative disorder/refractory anemia She remains stable on intermittent Aranesp injections. Plan continue the same.  #2. Hypothyroid on replacement.   CC:. Dr. Gwenith Spitz, MD 2/19/20138:13 PM

## 2012-02-04 NOTE — Telephone Encounter (Signed)
appt made and printed for  3/26 and also printed for 2/26

## 2012-02-11 ENCOUNTER — Other Ambulatory Visit (HOSPITAL_BASED_OUTPATIENT_CLINIC_OR_DEPARTMENT_OTHER): Payer: Medicare Other | Admitting: Lab

## 2012-02-11 ENCOUNTER — Ambulatory Visit: Payer: Medicare Other

## 2012-02-11 DIAGNOSIS — D47Z9 Other specified neoplasms of uncertain behavior of lymphoid, hematopoietic and related tissue: Secondary | ICD-10-CM

## 2012-02-11 DIAGNOSIS — D469 Myelodysplastic syndrome, unspecified: Secondary | ICD-10-CM

## 2012-02-11 LAB — CBC WITH DIFFERENTIAL/PLATELET
BASO%: 0.7 % (ref 0.0–2.0)
EOS%: 2.1 % (ref 0.0–7.0)
Eosinophils Absolute: 0.1 10*3/uL (ref 0.0–0.5)
LYMPH%: 22.9 % (ref 14.0–49.7)
MCH: 29.8 pg (ref 25.1–34.0)
MCHC: 34.3 g/dL (ref 31.5–36.0)
MCV: 86.7 fL (ref 79.5–101.0)
MONO%: 5.6 % (ref 0.0–14.0)
NEUT#: 3.7 10*3/uL (ref 1.5–6.5)
Platelets: 206 10*3/uL (ref 145–400)
RBC: 3.6 10*6/uL — ABNORMAL LOW (ref 3.70–5.45)
RDW: 16.7 % — ABNORMAL HIGH (ref 11.2–14.5)

## 2012-02-11 NOTE — Progress Notes (Signed)
Hgb 10.7 today. Hold Aranesp today per Dr Darnell Level. Patient informed of labs and plan of care. Patient to call with any concerns. Verbalized understanding.

## 2012-02-17 ENCOUNTER — Telehealth: Payer: Self-pay | Admitting: *Deleted

## 2012-02-18 NOTE — Telephone Encounter (Signed)
Opened in Error.

## 2012-02-25 DIAGNOSIS — Z1231 Encounter for screening mammogram for malignant neoplasm of breast: Secondary | ICD-10-CM | POA: Diagnosis not present

## 2012-02-26 DIAGNOSIS — IMO0002 Reserved for concepts with insufficient information to code with codable children: Secondary | ICD-10-CM | POA: Diagnosis not present

## 2012-02-26 DIAGNOSIS — R279 Unspecified lack of coordination: Secondary | ICD-10-CM | POA: Diagnosis not present

## 2012-02-26 DIAGNOSIS — R269 Unspecified abnormalities of gait and mobility: Secondary | ICD-10-CM | POA: Diagnosis not present

## 2012-02-28 DIAGNOSIS — IMO0002 Reserved for concepts with insufficient information to code with codable children: Secondary | ICD-10-CM | POA: Diagnosis not present

## 2012-02-28 DIAGNOSIS — R279 Unspecified lack of coordination: Secondary | ICD-10-CM | POA: Diagnosis not present

## 2012-02-28 DIAGNOSIS — R269 Unspecified abnormalities of gait and mobility: Secondary | ICD-10-CM | POA: Diagnosis not present

## 2012-03-03 DIAGNOSIS — R279 Unspecified lack of coordination: Secondary | ICD-10-CM | POA: Diagnosis not present

## 2012-03-03 DIAGNOSIS — R269 Unspecified abnormalities of gait and mobility: Secondary | ICD-10-CM | POA: Diagnosis not present

## 2012-03-03 DIAGNOSIS — IMO0002 Reserved for concepts with insufficient information to code with codable children: Secondary | ICD-10-CM | POA: Diagnosis not present

## 2012-03-04 DIAGNOSIS — R279 Unspecified lack of coordination: Secondary | ICD-10-CM | POA: Diagnosis not present

## 2012-03-04 DIAGNOSIS — IMO0002 Reserved for concepts with insufficient information to code with codable children: Secondary | ICD-10-CM | POA: Diagnosis not present

## 2012-03-04 DIAGNOSIS — R269 Unspecified abnormalities of gait and mobility: Secondary | ICD-10-CM | POA: Diagnosis not present

## 2012-03-06 DIAGNOSIS — R279 Unspecified lack of coordination: Secondary | ICD-10-CM | POA: Diagnosis not present

## 2012-03-06 DIAGNOSIS — R269 Unspecified abnormalities of gait and mobility: Secondary | ICD-10-CM | POA: Diagnosis not present

## 2012-03-06 DIAGNOSIS — IMO0002 Reserved for concepts with insufficient information to code with codable children: Secondary | ICD-10-CM | POA: Diagnosis not present

## 2012-03-10 ENCOUNTER — Ambulatory Visit (HOSPITAL_BASED_OUTPATIENT_CLINIC_OR_DEPARTMENT_OTHER): Payer: Medicare Other

## 2012-03-10 ENCOUNTER — Encounter: Payer: Self-pay | Admitting: Internal Medicine

## 2012-03-10 ENCOUNTER — Other Ambulatory Visit: Payer: Medicare Other | Admitting: Lab

## 2012-03-10 DIAGNOSIS — D469 Myelodysplastic syndrome, unspecified: Secondary | ICD-10-CM

## 2012-03-10 DIAGNOSIS — IMO0002 Reserved for concepts with insufficient information to code with codable children: Secondary | ICD-10-CM | POA: Diagnosis not present

## 2012-03-10 DIAGNOSIS — R279 Unspecified lack of coordination: Secondary | ICD-10-CM | POA: Diagnosis not present

## 2012-03-10 DIAGNOSIS — R269 Unspecified abnormalities of gait and mobility: Secondary | ICD-10-CM | POA: Diagnosis not present

## 2012-03-10 LAB — CBC WITH DIFFERENTIAL/PLATELET
BASO%: 0.2 % (ref 0.0–2.0)
Eosinophils Absolute: 0.1 10*3/uL (ref 0.0–0.5)
LYMPH%: 14.7 % (ref 14.0–49.7)
MCHC: 34.3 g/dL (ref 31.5–36.0)
MONO#: 0.5 10*3/uL (ref 0.1–0.9)
NEUT#: 5.9 10*3/uL (ref 1.5–6.5)
RBC: 3.07 10*6/uL — ABNORMAL LOW (ref 3.70–5.45)
RDW: 16.5 % — ABNORMAL HIGH (ref 11.2–14.5)
WBC: 7.7 10*3/uL (ref 3.9–10.3)
lymph#: 1.1 10*3/uL (ref 0.9–3.3)

## 2012-03-10 MED ORDER — DARBEPOETIN ALFA-POLYSORBATE 300 MCG/0.6ML IJ SOLN
300.0000 ug | Freq: Once | INTRAMUSCULAR | Status: AC
  Administered 2012-03-10: 300 ug via SUBCUTANEOUS

## 2012-03-10 NOTE — Patient Instructions (Addendum)
Pt in for injection.  NO complaints.  Pt instructed to call for questions and concerns.  Pt discharged home ambulatory.

## 2012-03-10 NOTE — Progress Notes (Signed)
Addended by: Doroteo Bradford on: 03/10/2012 12:05 PM   Modules accepted: Orders

## 2012-03-11 DIAGNOSIS — R279 Unspecified lack of coordination: Secondary | ICD-10-CM | POA: Diagnosis not present

## 2012-03-11 DIAGNOSIS — R269 Unspecified abnormalities of gait and mobility: Secondary | ICD-10-CM | POA: Diagnosis not present

## 2012-03-11 DIAGNOSIS — IMO0002 Reserved for concepts with insufficient information to code with codable children: Secondary | ICD-10-CM | POA: Diagnosis not present

## 2012-03-12 ENCOUNTER — Other Ambulatory Visit: Payer: Self-pay | Admitting: Internal Medicine

## 2012-03-13 DIAGNOSIS — R269 Unspecified abnormalities of gait and mobility: Secondary | ICD-10-CM | POA: Diagnosis not present

## 2012-03-13 DIAGNOSIS — IMO0002 Reserved for concepts with insufficient information to code with codable children: Secondary | ICD-10-CM | POA: Diagnosis not present

## 2012-03-13 DIAGNOSIS — R279 Unspecified lack of coordination: Secondary | ICD-10-CM | POA: Diagnosis not present

## 2012-03-18 ENCOUNTER — Other Ambulatory Visit: Payer: Self-pay | Admitting: Oncology

## 2012-03-18 ENCOUNTER — Other Ambulatory Visit: Payer: Self-pay | Admitting: *Deleted

## 2012-03-18 DIAGNOSIS — D471 Chronic myeloproliferative disease: Secondary | ICD-10-CM

## 2012-03-19 ENCOUNTER — Telehealth: Payer: Self-pay | Admitting: Oncology

## 2012-03-19 NOTE — Telephone Encounter (Signed)
S/w the pt's husband and he is aware of the July 2013 appt

## 2012-03-31 ENCOUNTER — Telehealth: Payer: Self-pay | Admitting: Oncology

## 2012-03-31 NOTE — Telephone Encounter (Signed)
Gave appt calendar for April, May,June and July lab and Injection, seeing ML on Jully 2013

## 2012-04-07 ENCOUNTER — Ambulatory Visit: Payer: Medicare Other

## 2012-04-07 ENCOUNTER — Other Ambulatory Visit: Payer: Medicare Other | Admitting: Lab

## 2012-04-07 ENCOUNTER — Other Ambulatory Visit: Payer: Self-pay

## 2012-04-08 ENCOUNTER — Telehealth: Payer: Self-pay | Admitting: Oncology

## 2012-04-08 ENCOUNTER — Other Ambulatory Visit: Payer: Self-pay

## 2012-04-08 DIAGNOSIS — D469 Myelodysplastic syndrome, unspecified: Secondary | ICD-10-CM

## 2012-04-08 NOTE — Telephone Encounter (Signed)
Talked to pt, r/s appt missed from 04/07/12 lab and injection to 04/09/12, pt aware of appt.

## 2012-04-08 NOTE — Telephone Encounter (Signed)
pt called to r/s 4/25 appt to 4/26,done    aom

## 2012-04-09 ENCOUNTER — Other Ambulatory Visit: Payer: Medicare Other

## 2012-04-09 ENCOUNTER — Ambulatory Visit: Payer: Medicare Other

## 2012-04-10 ENCOUNTER — Other Ambulatory Visit (HOSPITAL_BASED_OUTPATIENT_CLINIC_OR_DEPARTMENT_OTHER): Payer: Medicare Other | Admitting: Lab

## 2012-04-10 ENCOUNTER — Ambulatory Visit (HOSPITAL_BASED_OUTPATIENT_CLINIC_OR_DEPARTMENT_OTHER): Payer: Medicare Other

## 2012-04-10 VITALS — BP 134/75 | HR 92 | Temp 99.2°F

## 2012-04-10 DIAGNOSIS — D47Z9 Other specified neoplasms of uncertain behavior of lymphoid, hematopoietic and related tissue: Secondary | ICD-10-CM

## 2012-04-10 DIAGNOSIS — D471 Chronic myeloproliferative disease: Secondary | ICD-10-CM

## 2012-04-10 DIAGNOSIS — D469 Myelodysplastic syndrome, unspecified: Secondary | ICD-10-CM | POA: Diagnosis not present

## 2012-04-10 LAB — CBC WITH DIFFERENTIAL/PLATELET
BASO%: 0.6 % (ref 0.0–2.0)
LYMPH%: 26.5 % (ref 14.0–49.7)
MCHC: 33.3 g/dL (ref 31.5–36.0)
MONO#: 0.4 10*3/uL (ref 0.1–0.9)
Platelets: 195 10*3/uL (ref 145–400)
RBC: 3.4 10*6/uL — ABNORMAL LOW (ref 3.70–5.45)
WBC: 5.4 10*3/uL (ref 3.9–10.3)

## 2012-04-10 MED ORDER — DARBEPOETIN ALFA-POLYSORBATE 300 MCG/0.6ML IJ SOLN
300.0000 ug | Freq: Once | INTRAMUSCULAR | Status: AC
  Administered 2012-04-10: 300 ug via SUBCUTANEOUS
  Filled 2012-04-10: qty 0.6

## 2012-04-15 DIAGNOSIS — IMO0002 Reserved for concepts with insufficient information to code with codable children: Secondary | ICD-10-CM | POA: Diagnosis not present

## 2012-04-17 DIAGNOSIS — IMO0002 Reserved for concepts with insufficient information to code with codable children: Secondary | ICD-10-CM | POA: Diagnosis not present

## 2012-05-05 ENCOUNTER — Other Ambulatory Visit (HOSPITAL_BASED_OUTPATIENT_CLINIC_OR_DEPARTMENT_OTHER): Payer: Medicare Other | Admitting: Lab

## 2012-05-05 ENCOUNTER — Ambulatory Visit: Payer: Medicare Other

## 2012-05-05 DIAGNOSIS — D471 Chronic myeloproliferative disease: Secondary | ICD-10-CM

## 2012-05-05 DIAGNOSIS — D47Z9 Other specified neoplasms of uncertain behavior of lymphoid, hematopoietic and related tissue: Secondary | ICD-10-CM

## 2012-05-05 LAB — CBC WITH DIFFERENTIAL/PLATELET
Basophils Absolute: 0.1 10*3/uL (ref 0.0–0.1)
Eosinophils Absolute: 0.1 10*3/uL (ref 0.0–0.5)
HCT: 32.9 % — ABNORMAL LOW (ref 34.8–46.6)
HGB: 11 g/dL — ABNORMAL LOW (ref 11.6–15.9)
MCH: 29.5 pg (ref 25.1–34.0)
MONO#: 0.4 10*3/uL (ref 0.1–0.9)
NEUT#: 2.5 10*3/uL (ref 1.5–6.5)
NEUT%: 55.3 % (ref 38.4–76.8)
WBC: 4.5 10*3/uL (ref 3.9–10.3)
lymph#: 1.4 10*3/uL (ref 0.9–3.3)

## 2012-05-05 MED ORDER — DARBEPOETIN ALFA-POLYSORBATE 300 MCG/0.6ML IJ SOLN: 300.0000 ug | Freq: Once | INTRAMUSCULAR | Status: DC

## 2012-05-20 ENCOUNTER — Ambulatory Visit: Payer: Medicare Other | Admitting: Internal Medicine

## 2012-05-21 ENCOUNTER — Ambulatory Visit (INDEPENDENT_AMBULATORY_CARE_PROVIDER_SITE_OTHER): Payer: Medicare Other | Admitting: Internal Medicine

## 2012-05-21 ENCOUNTER — Encounter: Payer: Self-pay | Admitting: Internal Medicine

## 2012-05-21 VITALS — BP 136/68 | Temp 98.5°F | Wt 141.0 lb

## 2012-05-21 DIAGNOSIS — I10 Essential (primary) hypertension: Secondary | ICD-10-CM | POA: Diagnosis not present

## 2012-05-21 DIAGNOSIS — D469 Myelodysplastic syndrome, unspecified: Secondary | ICD-10-CM

## 2012-05-21 DIAGNOSIS — E119 Type 2 diabetes mellitus without complications: Secondary | ICD-10-CM

## 2012-05-21 NOTE — Assessment & Plan Note (Signed)
Has regular f/u with oncology/hematology

## 2012-05-22 NOTE — Progress Notes (Signed)
Patient comes in for followup. She is feeling quite well. Patient has a myelodysplastic syndrome is followed by hematology. In addition patient has hypertension, hypothyroidism a history of type 2 diabetes. She is not requiring any diabetic medications.  Past Medical History  Diagnosis Date  . Melanoma     x2  . Diabetes mellitus   . Diverticulosis of colon   . Hyperlipidemia   . Hypothyroidism   . Osteoporosis   . Blood transfusion 2006  . Myeloproliferative disorder 02/04/2012  . Normochromic normocytic anemia 02/04/2012    History   Social History  . Marital Status: Married    Spouse Name: N/A    Number of Children: N/A  . Years of Education: N/A   Occupational History  . Not on file.   Social History Main Topics  . Smoking status: Never Smoker   . Smokeless tobacco: Not on file  . Alcohol Use:   . Drug Use:   . Sexually Active:    Other Topics Concern  . Not on file   Social History Narrative  . No narrative on file    Past Surgical History  Procedure Date  . Polypectomy     Laparoscopic  . Appendectomy   . Carpal tunnel release   . Melanoma excision   . Replacement total knee   . Rectocele repair     Family History  Problem Relation Age of Onset  . Prostate cancer Brother   . Cancer Brother     renal cell carcinoma  . Heart disease Brother   . Cancer Mother     breast cancer  . Cancer Father     unknown kind    Allergies  Allergen Reactions  . Lisinopril     REACTION: angioedema  . Penicillins     REACTION: rash    Current Outpatient Prescriptions on File Prior to Visit  Medication Sig Dispense Refill  . Ascorbic Acid (VITAMIN C) 500 MG tablet Take 500 mg by mouth daily.        Marland Kitchen aspirin 81 MG tablet Take 81 mg by mouth daily.        . Calcium Carbonate-Vit D-Min (CALTRATE 600+D PLUS) 600-400 MG-UNIT per tablet Take 1 tablet by mouth daily.        . cycloSPORINE (RESTASIS) 0.05 % ophthalmic emulsion Place 1 drop into both eyes 2 (two)  times daily.        Marland Kitchen losartan (COZAAR) 100 MG tablet Take 1 tablet (100 mg total) by mouth daily.  90 tablet  1  . Multiple Vitamin (MULTIVITAMIN) tablet Take 1 tablet by mouth daily.        Marland Kitchen SYNTHROID 88 MCG tablet TAKE 1 TABLET DAILY  90 tablet  3  . traMADol (ULTRAM) 50 MG tablet TAKE 1 TABLET TWICE A DAY AS NEEDED FOR PAIN  40 tablet  2  . verapamil (CALAN-SR) 240 MG CR tablet TAKE 1 AND 1/2 TABLETS ONCEDAILY  135 tablet  3     patient denies chest pain, shortness of breath, orthopnea. Denies lower extremity edema, abdominal pain, change in appetite, change in bowel movements. Patient denies rashes, musculoskeletal complaints. No other specific complaints in a complete review of systems.   BP 136/68  Temp(Src) 98.5 F (36.9 C) (Oral)  Wt 141 lb (63.957 kg)  patient comes in for followup of multiple medical problems including type 2 diabetes, hyperlipidemia, hypertension. The patient does not check blood sugar or blood pressure at home. The patetient does not follow  an exercise or diet program. The patient denies any polyuria, polydipsia.  In the past the patient has gone to diabetic treatment center. The patient is tolerating medications  Without difficulty. The patient does admit to medication compliance.  . Continue current medications.

## 2012-05-22 NOTE — Assessment & Plan Note (Signed)
Lab Results  Component Value Date   HGBA1C 5.0 04/23/2011   A1c has been well controlled. No need for recheck.

## 2012-05-22 NOTE — Assessment & Plan Note (Signed)
BP Readings from Last 3 Encounters:  05/21/12 136/68  04/10/12 134/75  02/04/12 157/83   Adequate control. Continue current medications.

## 2012-05-26 DIAGNOSIS — H2510 Age-related nuclear cataract, unspecified eye: Secondary | ICD-10-CM | POA: Diagnosis not present

## 2012-06-02 ENCOUNTER — Ambulatory Visit (HOSPITAL_BASED_OUTPATIENT_CLINIC_OR_DEPARTMENT_OTHER): Payer: Medicare Other

## 2012-06-02 ENCOUNTER — Other Ambulatory Visit (HOSPITAL_BASED_OUTPATIENT_CLINIC_OR_DEPARTMENT_OTHER): Payer: Medicare Other | Admitting: Lab

## 2012-06-02 VITALS — BP 158/78 | HR 82 | Temp 96.7°F

## 2012-06-02 DIAGNOSIS — D471 Chronic myeloproliferative disease: Secondary | ICD-10-CM

## 2012-06-02 DIAGNOSIS — D47Z9 Other specified neoplasms of uncertain behavior of lymphoid, hematopoietic and related tissue: Secondary | ICD-10-CM

## 2012-06-02 DIAGNOSIS — D4620 Refractory anemia with excess of blasts, unspecified: Secondary | ICD-10-CM | POA: Diagnosis not present

## 2012-06-02 LAB — CBC WITH DIFFERENTIAL/PLATELET
Basophils Absolute: 0.1 10*3/uL (ref 0.0–0.1)
Eosinophils Absolute: 0.1 10*3/uL (ref 0.0–0.5)
HCT: 28.2 % — ABNORMAL LOW (ref 34.8–46.6)
HGB: 9.8 g/dL — ABNORMAL LOW (ref 11.6–15.9)
LYMPH%: 27.5 % (ref 14.0–49.7)
MCV: 86 fL (ref 79.5–101.0)
MONO#: 0.5 10*3/uL (ref 0.1–0.9)
MONO%: 9.6 % (ref 0.0–14.0)
NEUT#: 2.9 10*3/uL (ref 1.5–6.5)
Platelets: 176 10*3/uL (ref 145–400)
WBC: 5 10*3/uL (ref 3.9–10.3)

## 2012-06-02 MED ORDER — DARBEPOETIN ALFA-POLYSORBATE 300 MCG/0.6ML IJ SOLN
300.0000 ug | Freq: Once | INTRAMUSCULAR | Status: AC
  Administered 2012-06-02: 300 ug via SUBCUTANEOUS
  Filled 2012-06-02: qty 0.6

## 2012-06-30 ENCOUNTER — Ambulatory Visit (HOSPITAL_BASED_OUTPATIENT_CLINIC_OR_DEPARTMENT_OTHER): Payer: Medicare Other | Admitting: Nurse Practitioner

## 2012-06-30 ENCOUNTER — Other Ambulatory Visit (HOSPITAL_BASED_OUTPATIENT_CLINIC_OR_DEPARTMENT_OTHER): Payer: Medicare Other | Admitting: Lab

## 2012-06-30 ENCOUNTER — Ambulatory Visit: Payer: Medicare Other

## 2012-06-30 ENCOUNTER — Telehealth: Payer: Self-pay | Admitting: Oncology

## 2012-06-30 VITALS — BP 146/74 | HR 87 | Temp 97.4°F | Ht 60.0 in | Wt 142.7 lb

## 2012-06-30 DIAGNOSIS — D4620 Refractory anemia with excess of blasts, unspecified: Secondary | ICD-10-CM | POA: Diagnosis not present

## 2012-06-30 DIAGNOSIS — D471 Chronic myeloproliferative disease: Secondary | ICD-10-CM

## 2012-06-30 DIAGNOSIS — D47Z9 Other specified neoplasms of uncertain behavior of lymphoid, hematopoietic and related tissue: Secondary | ICD-10-CM

## 2012-06-30 DIAGNOSIS — D469 Myelodysplastic syndrome, unspecified: Secondary | ICD-10-CM

## 2012-06-30 LAB — CBC WITH DIFFERENTIAL/PLATELET
Eosinophils Absolute: 0.2 10*3/uL (ref 0.0–0.5)
HCT: 29.4 % — ABNORMAL LOW (ref 34.8–46.6)
LYMPH%: 19.5 % (ref 14.0–49.7)
MCHC: 33.6 g/dL (ref 31.5–36.0)
MCV: 87.5 fL (ref 79.5–101.0)
MONO%: 9.2 % (ref 0.0–14.0)
NEUT#: 4.9 10*3/uL (ref 1.5–6.5)
NEUT%: 67.4 % (ref 38.4–76.8)
Platelets: 207 10*3/uL (ref 145–400)
RBC: 3.36 10*6/uL — ABNORMAL LOW (ref 3.70–5.45)

## 2012-06-30 MED ORDER — DARBEPOETIN ALFA-POLYSORBATE 300 MCG/0.6ML IJ SOLN
300.0000 ug | Freq: Once | INTRAMUSCULAR | Status: AC
  Administered 2012-06-30: 300 ug via SUBCUTANEOUS

## 2012-06-30 NOTE — Progress Notes (Signed)
OFFICE PROGRESS NOTE  Interval history:  Ms. Providence is an 78 year old woman with a myeloproliferative disorder with dysplastic features. Initial diagnosis dates to May of 2008 when she presented with an unexplained normochromic anemia. Bone marrow biopsy 04/23/2007 showed a hypercellular marrow for age; maturation was normal in all 3 cell lines; one percent blasts. Cytogenetics were normal. There were no ringed sideroblasts. She was started on a trial of Aranesp in June 2010 when her hemoglobin fell to 8.6. She is currently on a dose of 300 mcg every 4 weeks with hemoglobin maintaining in the upper 9-11 range.  She overall feels well. No interim illnesses or infections. She has a good appetite. She remains active. No shortness of breath or chest pain. She denies bleeding.   Objective: Blood pressure 146/74, pulse 87, temperature 97.4 F (36.3 C), temperature source Oral, height 5' (1.524 m), weight 142 lb 11.2 oz (64.728 kg).  Oropharynx is without thrush or ulceration. Lungs are clear. No wheezes or rales. Regular cardiac rhythm. Abdomen is soft and nontender. No organomegaly. Extremities are without edema.  Lab Results: Lab Results  Component Value Date   WBC 7.3 06/30/2012   HGB 9.9* 06/30/2012   HCT 29.4* 06/30/2012   MCV 87.5 06/30/2012   PLT 207 06/30/2012    Chemistry:    Chemistry      Component Value Date/Time   NA 140 02/04/2012 1042   K 4.7 02/04/2012 1042   CL 106 02/04/2012 1042   CO2 24 02/04/2012 1042   BUN 29* 02/04/2012 1042   CREATININE 1.06 02/04/2012 1042      Component Value Date/Time   CALCIUM 10.0 02/04/2012 1042   ALKPHOS 70 02/04/2012 1042   AST 19 02/04/2012 1042   ALT 24 02/04/2012 1042   BILITOT 0.6 02/04/2012 1042       Studies/Results: No results found.  Medications: I have reviewed the patient's current medications.  Assessment/Plan:  1. Myeloproliferative disorder with dysplastic features diagnosed May 2008 presenting with an unexplained normochromic  anemia. Trial of Aranesp initiated June 2010 when the hemoglobin fell. Current dose is 300 mcg every 4 weeks. 2. Hypothyroid on replacement.  Disposition-Ms. Becherer appears stable. Plan to continue Aranesp 300 mcg every 4 weeks. She will return for a followup visit in 4 months. She will contact the office in the interim with any problems.  Ned Card ANP/GNP-BC

## 2012-06-30 NOTE — Telephone Encounter (Signed)
appts made and printed for pt

## 2012-06-30 NOTE — Addendum Note (Signed)
Addended by: Jesse Fall on: 06/30/2012 03:35 PM   Modules accepted: Orders

## 2012-07-07 ENCOUNTER — Encounter (HOSPITAL_COMMUNITY): Payer: Self-pay | Admitting: *Deleted

## 2012-07-07 ENCOUNTER — Inpatient Hospital Stay (HOSPITAL_COMMUNITY)
Admission: EM | Admit: 2012-07-07 | Discharge: 2012-07-09 | DRG: 394 | Disposition: A | Discharge status: Home / Self Care | Payer: Medicare Other | Attending: Internal Medicine | Admitting: Internal Medicine

## 2012-07-07 ENCOUNTER — Encounter: Payer: Self-pay | Admitting: Family

## 2012-07-07 ENCOUNTER — Ambulatory Visit (INDEPENDENT_AMBULATORY_CARE_PROVIDER_SITE_OTHER): Payer: Medicare Other | Admitting: Family

## 2012-07-07 ENCOUNTER — Emergency Department (HOSPITAL_COMMUNITY): Payer: Medicare Other

## 2012-07-07 VITALS — BP 122/80 | HR 78 | Temp 98.8°F | Wt 139.0 lb

## 2012-07-07 DIAGNOSIS — N269 Renal sclerosis, unspecified: Secondary | ICD-10-CM | POA: Diagnosis not present

## 2012-07-07 DIAGNOSIS — R933 Abnormal findings on diagnostic imaging of other parts of digestive tract: Secondary | ICD-10-CM

## 2012-07-07 DIAGNOSIS — E119 Type 2 diabetes mellitus without complications: Secondary | ICD-10-CM | POA: Diagnosis not present

## 2012-07-07 DIAGNOSIS — D649 Anemia, unspecified: Secondary | ICD-10-CM

## 2012-07-07 DIAGNOSIS — E785 Hyperlipidemia, unspecified: Secondary | ICD-10-CM

## 2012-07-07 DIAGNOSIS — Z85828 Personal history of other malignant neoplasm of skin: Secondary | ICD-10-CM

## 2012-07-07 DIAGNOSIS — K922 Gastrointestinal hemorrhage, unspecified: Secondary | ICD-10-CM | POA: Diagnosis present

## 2012-07-07 DIAGNOSIS — D47Z9 Other specified neoplasms of uncertain behavior of lymphoid, hematopoietic and related tissue: Secondary | ICD-10-CM | POA: Diagnosis present

## 2012-07-07 DIAGNOSIS — M549 Dorsalgia, unspecified: Secondary | ICD-10-CM

## 2012-07-07 DIAGNOSIS — R141 Gas pain: Secondary | ICD-10-CM | POA: Diagnosis not present

## 2012-07-07 DIAGNOSIS — M810 Age-related osteoporosis without current pathological fracture: Secondary | ICD-10-CM

## 2012-07-07 DIAGNOSIS — R109 Unspecified abdominal pain: Secondary | ICD-10-CM | POA: Diagnosis not present

## 2012-07-07 DIAGNOSIS — E039 Hypothyroidism, unspecified: Secondary | ICD-10-CM | POA: Diagnosis present

## 2012-07-07 DIAGNOSIS — K6289 Other specified diseases of anus and rectum: Secondary | ICD-10-CM | POA: Diagnosis not present

## 2012-07-07 DIAGNOSIS — Z860100 Personal history of colon polyps, unspecified: Secondary | ICD-10-CM

## 2012-07-07 DIAGNOSIS — K5730 Diverticulosis of large intestine without perforation or abscess without bleeding: Secondary | ICD-10-CM | POA: Diagnosis not present

## 2012-07-07 DIAGNOSIS — I10 Essential (primary) hypertension: Secondary | ICD-10-CM

## 2012-07-07 DIAGNOSIS — K5900 Constipation, unspecified: Secondary | ICD-10-CM

## 2012-07-07 DIAGNOSIS — N281 Cyst of kidney, acquired: Secondary | ICD-10-CM | POA: Diagnosis not present

## 2012-07-07 DIAGNOSIS — D471 Chronic myeloproliferative disease: Secondary | ICD-10-CM

## 2012-07-07 DIAGNOSIS — K625 Hemorrhage of anus and rectum: Secondary | ICD-10-CM | POA: Diagnosis present

## 2012-07-07 DIAGNOSIS — D469 Myelodysplastic syndrome, unspecified: Secondary | ICD-10-CM | POA: Diagnosis present

## 2012-07-07 NOTE — Progress Notes (Signed)
Subjective:    Patient ID: Brittany [name], female    DOB: [birth year], 78 y.o.   MRN: [id number]  Constipation This is a new problem. The current episode started in the past 7 days. The problem is unchanged. Her stool frequency is 4 to 5 times per week. The stool is described as firm and pellet like. The patient is not on a high fiber diet. She does not exercise regularly. There has not been adequate water intake. Pertinent negatives include no abdominal pain, diarrhea, nausea or vomiting. Treatments tried: suppositories. The treatment provided no relief.      Review of Systems  Constitutional: Negative.   Respiratory: Negative.   Cardiovascular: Negative.   Gastrointestinal: Positive for constipation. Negative for nausea, vomiting, abdominal pain and diarrhea.  Genitourinary: Negative.   Musculoskeletal: Negative.   Skin: Negative.   Neurological: Negative.   Hematological: Negative.   Psychiatric/Behavioral: Negative.    Past Medical History  Diagnosis Date  . Melanoma     x2  . Diabetes mellitus   . Diverticulosis of colon   . Hyperlipidemia   . Hypothyroidism   . Osteoporosis   . Blood transfusion 2006  . Myeloproliferative disorder 02/04/2012  . Normochromic normocytic anemia 02/04/2012    History   Social History  . Marital Status: Married    Spouse Name: N/A    Number of Children: N/A  . Years of Education: N/A   Occupational History  . Not on file.   Social History Main Topics  . Smoking status: Never Smoker   . Smokeless tobacco: Not on file  . Alcohol Use:   . Drug Use:   . Sexually Active:    Other Topics Concern  . Not on file   Social History Narrative  . No narrative on file    Past Surgical History  Procedure Date  . Polypectomy     Laparoscopic  . Appendectomy   . Carpal tunnel release   . Melanoma excision   . Replacement total knee   . Rectocele repair     Family History  Problem Relation Age of Onset  . Prostate cancer Brother     . Cancer Brother     renal cell carcinoma  . Heart disease Brother   . Cancer Mother     breast cancer  . Cancer Father     unknown kind    Allergies  Allergen Reactions  . Lisinopril     REACTION: angioedema  . Penicillins     REACTION: rash    Current Outpatient Prescriptions on File Prior to Visit  Medication Sig Dispense Refill  . Ascorbic Acid (VITAMIN C) 500 MG tablet Take 500 mg by mouth daily.        Marland Kitchen aspirin 81 MG tablet Take 81 mg by mouth daily.        . Calcium Carbonate-Vit D-Min (CALTRATE 600+D PLUS) 600-400 MG-UNIT per tablet Take 1 tablet by mouth daily.        . cycloSPORINE (RESTASIS) 0.05 % ophthalmic emulsion Place 1 drop into both eyes 2 (two) times daily.        Marland Kitchen losartan (COZAAR) 100 MG tablet Take 1 tablet (100 mg total) by mouth daily.  90 tablet  1  . Multiple Vitamin (MULTIVITAMIN) tablet Take 1 tablet by mouth daily.        Marland Kitchen SYNTHROID 88 MCG tablet TAKE 1 TABLET DAILY  90 tablet  3  . traMADol (ULTRAM) 50 MG tablet TAKE 1 TABLET TWICE A  DAY AS NEEDED FOR PAIN  40 tablet  2  . verapamil (CALAN-SR) 240 MG CR tablet TAKE 1 AND 1/2 TABLETS ONCEDAILY  135 tablet  3    BP 122/80  Pulse 78  Temp 98.8 F (37.1 C) (Oral)  Wt 139 lb (63.05 kg)  SpO2 96%chart    Objective:   Physical Exam  Constitutional: She is oriented to person, place, and time. She appears well-developed and well-nourished.  Neck: Normal range of motion. Neck supple.  Cardiovascular: Normal rate, regular rhythm and normal heart sounds.   Pulmonary/Chest: Effort normal and breath sounds normal.  Abdominal: Soft. Bowel sounds are normal. There is no tenderness. There is no rebound and no guarding.  Neurological: She is alert and oriented to person, place, and time.  Skin: Skin is warm and dry.  Psychiatric: She has a normal mood and affect.          Assessment & Plan:  Assessment: Constipation, Hypertension  Plan: Magnesium Citrate 1 bottle by mouth x 1. Call the office  if symptoms worsen or persist. Recheck as scheduled an as needed sooner.

## 2012-07-07 NOTE — ED Notes (Signed)
Pt c/o last bowel movement Saturday; feels like needs to have a bowel movement but can't; denies abd pain; denies nausea; states had a fever earlier today; denies urinary symptoms

## 2012-07-07 NOTE — Patient Instructions (Signed)
Magnesium Citrate 1 bottle x 1.  Constipation in Adults Constipation is having fewer than 2 bowel movements per week. Usually, the stools are hard. As we grow older, constipation is more common. If you try to fix constipation with laxatives, the problem may get worse. This is because laxatives taken over a long period of time make the colon muscles weaker. A low-fiber diet, not taking in enough fluids, and taking some medicines may make these problems worse. MEDICATIONS THAT MAY CAUSE CONSTIPATION  Water pills (diuretics).   Calcium channel blockers (used to control blood pressure and for the heart).   Certain pain medicines (narcotics).   Anticholinergics.   Anti-inflammatory agents.   Antacids that contain aluminum.  DISEASES THAT CONTRIBUTE TO CONSTIPATION  Diabetes.   Parkinson's disease.   Dementia.   Stroke.   Depression.   Illnesses that cause problems with salt and water metabolism.  HOME CARE INSTRUCTIONS   Constipation is usually best cared for without medicines. Increasing dietary fiber and eating more fruits and vegetables is the best way to manage constipation.   Slowly increase fiber intake to 25 to 38 grams per day. Whole grains, fruits, vegetables, and legumes are good sources of fiber. A dietitian can further help you incorporate high-fiber foods into your diet.   Drink enough water and fluids to keep your urine clear or pale yellow.   A fiber supplement may be added to your diet if you cannot get enough fiber from foods.   Increasing your activities also helps improve regularity.   Suppositories, as suggested by your caregiver, will also help. If you are using antacids, such as aluminum or calcium containing products, it will be helpful to switch to products containing magnesium if your caregiver says it is okay.   If you have been given a liquid injection (enema) today, this is only a temporary measure. It should not be relied on for treatment of  longstanding (chronic) constipation.   Stronger measures, such as magnesium sulfate, should be avoided if possible. This may cause uncontrollable diarrhea. Using magnesium sulfate may not allow you time to make it to the bathroom.  SEEK IMMEDIATE MEDICAL CARE IF:   There is bright red blood in the stool.   The constipation stays for more than 4 days.   There is belly (abdominal) or rectal pain.   You do not seem to be getting better.   You have any questions or concerns.  MAKE SURE YOU:   Understand these instructions.   Will watch your condition.   Will get help right away if you are not doing well or get worse.  Document Released: 08/30/2004 Document Revised: 11/21/2011 Document Reviewed: 11/05/2011 [facility] Patient Information 2012 [place], [place].

## 2012-07-07 NOTE — ED Notes (Signed)
Pt saw pcp doctor today and drank a bottle of magnesium citrate without results

## 2012-07-08 ENCOUNTER — Inpatient Hospital Stay (HOSPITAL_COMMUNITY): Payer: Medicare Other

## 2012-07-08 ENCOUNTER — Emergency Department (HOSPITAL_COMMUNITY): Payer: Medicare Other

## 2012-07-08 ENCOUNTER — Encounter (HOSPITAL_COMMUNITY): Payer: Self-pay | Admitting: Internal Medicine

## 2012-07-08 DIAGNOSIS — I10 Essential (primary) hypertension: Secondary | ICD-10-CM

## 2012-07-08 DIAGNOSIS — R933 Abnormal findings on diagnostic imaging of other parts of digestive tract: Secondary | ICD-10-CM | POA: Diagnosis not present

## 2012-07-08 DIAGNOSIS — K5730 Diverticulosis of large intestine without perforation or abscess without bleeding: Secondary | ICD-10-CM | POA: Diagnosis not present

## 2012-07-08 DIAGNOSIS — K922 Gastrointestinal hemorrhage, unspecified: Secondary | ICD-10-CM | POA: Diagnosis present

## 2012-07-08 DIAGNOSIS — N281 Cyst of kidney, acquired: Secondary | ICD-10-CM | POA: Diagnosis not present

## 2012-07-08 DIAGNOSIS — E119 Type 2 diabetes mellitus without complications: Secondary | ICD-10-CM

## 2012-07-08 DIAGNOSIS — Z86010 Personal history of colonic polyps: Secondary | ICD-10-CM | POA: Diagnosis not present

## 2012-07-08 DIAGNOSIS — K626 Ulcer of anus and rectum: Secondary | ICD-10-CM | POA: Diagnosis not present

## 2012-07-08 DIAGNOSIS — E039 Hypothyroidism, unspecified: Secondary | ICD-10-CM | POA: Diagnosis not present

## 2012-07-08 DIAGNOSIS — K5900 Constipation, unspecified: Secondary | ICD-10-CM

## 2012-07-08 DIAGNOSIS — D47Z9 Other specified neoplasms of uncertain behavior of lymphoid, hematopoietic and related tissue: Secondary | ICD-10-CM | POA: Diagnosis present

## 2012-07-08 DIAGNOSIS — D649 Anemia, unspecified: Secondary | ICD-10-CM | POA: Diagnosis present

## 2012-07-08 DIAGNOSIS — K625 Hemorrhage of anus and rectum: Secondary | ICD-10-CM | POA: Diagnosis present

## 2012-07-08 DIAGNOSIS — N269 Renal sclerosis, unspecified: Secondary | ICD-10-CM | POA: Diagnosis not present

## 2012-07-08 DIAGNOSIS — K6289 Other specified diseases of anus and rectum: Secondary | ICD-10-CM | POA: Diagnosis not present

## 2012-07-08 LAB — URINALYSIS, ROUTINE W REFLEX MICROSCOPIC
Glucose, UA: NEGATIVE mg/dL
Hgb urine dipstick: NEGATIVE
Ketones, ur: 15 mg/dL — AB
Protein, ur: NEGATIVE mg/dL
pH: 6 (ref 5.0–8.0)

## 2012-07-08 LAB — BASIC METABOLIC PANEL
CO2: 24 mEq/L (ref 19–32)
Calcium: 10.2 mg/dL (ref 8.4–10.5)
Chloride: 101 mEq/L (ref 96–112)
Creatinine, Ser: 1.19 mg/dL — ABNORMAL HIGH (ref 0.50–1.10)
Glucose, Bld: 141 mg/dL — ABNORMAL HIGH (ref 70–99)

## 2012-07-08 LAB — CBC
HCT: 33.1 % — ABNORMAL LOW (ref 36.0–46.0)
Hemoglobin: 11 g/dL — ABNORMAL LOW (ref 12.0–15.0)
MCV: 86.4 fL (ref 78.0–100.0)
RBC: 3.83 MIL/uL — ABNORMAL LOW (ref 3.87–5.11)
WBC: 18.6 10*3/uL — ABNORMAL HIGH (ref 4.0–10.5)

## 2012-07-08 LAB — GLUCOSE, CAPILLARY: Glucose-Capillary: 147 mg/dL — ABNORMAL HIGH (ref 70–99)

## 2012-07-08 LAB — DIFFERENTIAL
Eosinophils Relative: 1 % (ref 0–5)
Lymphocytes Relative: 3 % — ABNORMAL LOW (ref 12–46)
Lymphs Abs: 0.5 10*3/uL — ABNORMAL LOW (ref 0.7–4.0)
Monocytes Absolute: 1.5 10*3/uL — ABNORMAL HIGH (ref 0.1–1.0)

## 2012-07-08 LAB — HEMOGLOBIN A1C: Hgb A1c MFr Bld: 5 % (ref <5.7)

## 2012-07-08 LAB — LACTIC ACID, PLASMA: Lactic Acid, Venous: 1.5 mmol/L (ref 0.5–2.2)

## 2012-07-08 MED ORDER — CYCLOSPORINE 0.05 % OP EMUL
1.0000 [drp] | Freq: Two times a day (BID) | OPHTHALMIC | Status: DC
  Administered 2012-07-08 – 2012-07-09 (×3): 1 [drp] via OPHTHALMIC
  Filled 2012-07-08 (×4): qty 1

## 2012-07-08 MED ORDER — TRAMADOL HCL 50 MG PO TABS: 50.0000 mg | ORAL_TABLET | Freq: Two times a day (BID) | ORAL | Status: DC | PRN

## 2012-07-08 MED ORDER — CLOTRIMAZOLE 10 MG MT TROC
10.0000 mg | Freq: Every day | OROMUCOSAL | Status: DC
  Filled 2012-07-08 (×5): qty 1

## 2012-07-08 MED ORDER — VITAMIN C 500 MG PO TABS
500.0000 mg | ORAL_TABLET | Freq: Every day | ORAL | Status: DC
  Administered 2012-07-08 – 2012-07-09 (×2): 500 mg via ORAL
  Filled 2012-07-08 (×3): qty 1

## 2012-07-08 MED ORDER — CIPROFLOXACIN IN D5W 400 MG/200ML IV SOLN
400.0000 mg | INTRAVENOUS | Status: DC
  Administered 2012-07-09: 400 mg via INTRAVENOUS
  Filled 2012-07-08: qty 200

## 2012-07-08 MED ORDER — ACETAMINOPHEN 500 MG PO TABS: 500.0000 mg | ORAL_TABLET | Freq: Four times a day (QID) | ORAL | Status: DC | PRN

## 2012-07-08 MED ORDER — ADULT MULTIVITAMIN W/MINERALS CH
1.0000 | ORAL_TABLET | Freq: Every day | ORAL | Status: DC
  Administered 2012-07-08 – 2012-07-09 (×2): 1 via ORAL
  Filled 2012-07-08 (×2): qty 1

## 2012-07-08 MED ORDER — SODIUM CHLORIDE 0.9 % IV SOLN
INTRAVENOUS | Status: DC
  Administered 2012-07-08 (×2): via INTRAVENOUS
  Administered 2012-07-09: 20 mL/h via INTRAVENOUS

## 2012-07-08 MED ORDER — SODIUM CHLORIDE 0.45 % IV SOLN
Freq: Once | INTRAVENOUS | Status: AC
  Administered 2012-07-09: 10 mL/h via INTRAVENOUS

## 2012-07-08 MED ORDER — INSULIN ASPART 100 UNIT/ML ~~LOC~~ SOLN: 0.0000 [IU] | Freq: Three times a day (TID) | SUBCUTANEOUS | Status: DC

## 2012-07-08 MED ORDER — METRONIDAZOLE IN NACL 5-0.79 MG/ML-% IV SOLN
500.0000 mg | Freq: Once | INTRAVENOUS | Status: AC
  Administered 2012-07-08: 500 mg via INTRAVENOUS
  Filled 2012-07-08: qty 100

## 2012-07-08 MED ORDER — LOSARTAN POTASSIUM 50 MG PO TABS
100.0000 mg | ORAL_TABLET | Freq: Every day | ORAL | Status: DC
  Administered 2012-07-08 – 2012-07-09 (×2): 100 mg via ORAL
  Filled 2012-07-08 (×3): qty 2

## 2012-07-08 MED ORDER — LEVOTHYROXINE SODIUM 88 MCG PO TABS
88.0000 ug | ORAL_TABLET | Freq: Every day | ORAL | Status: DC
Start: 2012-07-08 — End: 2012-07-09
  Administered 2012-07-08 – 2012-07-09 (×2): 88 ug via ORAL
  Filled 2012-07-08 (×3): qty 1

## 2012-07-08 MED ORDER — IOHEXOL 300 MG/ML  SOLN
100.0000 mL | Freq: Once | INTRAMUSCULAR | Status: AC | PRN
  Administered 2012-07-08: 100 mL via INTRAVENOUS

## 2012-07-08 MED ORDER — SODIUM CHLORIDE 0.9 % IV SOLN
Freq: Once | INTRAVENOUS | Status: AC
  Administered 2012-07-08: 01:00:00 via INTRAVENOUS

## 2012-07-08 MED ORDER — INSULIN ASPART 100 UNIT/ML ~~LOC~~ SOLN
0.0000 [IU] | SUBCUTANEOUS | Status: DC
  Administered 2012-07-08: 1 [IU] via SUBCUTANEOUS

## 2012-07-08 MED ORDER — CIPROFLOXACIN IN D5W 400 MG/200ML IV SOLN
400.0000 mg | Freq: Once | INTRAVENOUS | Status: AC
  Administered 2012-07-08: 400 mg via INTRAVENOUS
  Filled 2012-07-08: qty 200

## 2012-07-08 MED ORDER — ASPIRIN EC 81 MG PO TBEC
81.0000 mg | DELAYED_RELEASE_TABLET | Freq: Every day | ORAL | Status: DC
  Administered 2012-07-08 – 2012-07-09 (×2): 81 mg via ORAL
  Filled 2012-07-08 (×2): qty 1

## 2012-07-08 MED ORDER — METRONIDAZOLE IN NACL 5-0.79 MG/ML-% IV SOLN
500.0000 mg | Freq: Three times a day (TID) | INTRAVENOUS | Status: DC
  Administered 2012-07-08: 500 mg via INTRAVENOUS
  Filled 2012-07-08: qty 100

## 2012-07-08 MED ORDER — INSULIN ASPART 100 UNIT/ML ~~LOC~~ SOLN: 0.0000 [IU] | Freq: Every day | SUBCUTANEOUS | Status: DC

## 2012-07-08 MED ORDER — METRONIDAZOLE IN NACL 5-0.79 MG/ML-% IV SOLN
500.0000 mg | Freq: Three times a day (TID) | INTRAVENOUS | Status: DC
  Administered 2012-07-08 – 2012-07-09 (×3): 500 mg via INTRAVENOUS
  Filled 2012-07-08 (×5): qty 100

## 2012-07-08 MED ORDER — VERAPAMIL HCL ER 180 MG PO TBCR
360.0000 mg | EXTENDED_RELEASE_TABLET | Freq: Every day | ORAL | Status: DC
  Administered 2012-07-08 – 2012-07-09 (×2): 360 mg via ORAL
  Filled 2012-07-08 (×2): qty 2

## 2012-07-08 NOTE — Progress Notes (Signed)
Patient arrived to floor at approximately 0645 am.  Patient was assisted to bathroom by transporting tech.  I introduced myself and told patient to pull the cord in the bathroom after she was finished.  Patient apparently has been having a few episodes of diarrhea since being at the hospital.  Patient is alert and oriented.  Will monitor and give report to oncoming shift. Azzie Glatter [place]

## 2012-07-08 NOTE — Consult Note (Signed)
[facility] Consultation  Referring Provider: [facility] Primary Care Physician:  Chancy Hurter, MD Primary Gastroenterologist:   Oretha Caprice, MD Reason for Consultation:  constipation, rectal bleeding and abnormal CTscan.   HPI: Brittany [name] is a 78 y.o. female known to Dr. Ardis Hughs for a history of adenomatous colon polyps. Patient was admitted through the emergency department early this morning for acute constipation. Patient normally has 2-3 bowel movements a day but prior to coming to the emergency department, she had not had a bowel movement in 3 days. Patient reports small rectal bleeding (toilet tissue), but this has been an ongoing problem for years. No rectal pain, no abdominal pain. CT scan with contrast of the abdomen and pelvis reveals markedly rectal thickening.  Since admission patient has had a bowel movement. Prior to admission she too Mg Citrate prescribed by PCP.  No fever or chills at home but patient has a low-grade temp in the hospital.   Past Medical History  Diagnosis Date  . Melanoma     x2  . Diabetes mellitus   . Diverticulosis of colon   . Hyperlipidemia   . Hypothyroidism   . Osteoporosis   . Blood transfusion 2006  . Myeloproliferative disorder 02/04/2012  . Normochromic normocytic anemia 02/04/2012    Past Surgical History  Procedure Date  . Polypectomy     Laparoscopic  . Appendectomy   . Carpal tunnel release   . Melanoma excision   . Replacement total knee   . Rectocele repair   . Vaginal hysterectomy     Prior to Admission medications   Medication Sig Start Date End Date Taking? Authorizing Provider  acetaminophen (TYLENOL) 500 MG tablet Take 500 mg by mouth every 6 (six) hours as needed. For pain.   Yes Historical Provider, MD  Ascorbic Acid (VITAMIN C) 500 MG tablet Take 500 mg by mouth daily.     Yes Historical Provider, MD  aspirin EC 81 MG tablet Take 81 mg by mouth daily.   Yes Historical Provider, MD  Calcium  Carbonate-Vit D-Min (CALTRATE 600+D PLUS) 600-400 MG-UNIT per tablet Take 1 tablet by mouth daily.     Yes Historical Provider, MD  cycloSPORINE (RESTASIS) 0.05 % ophthalmic emulsion Place 1 drop into both eyes 2 (two) times daily.     Yes Historical Provider, MD  losartan (COZAAR) 100 MG tablet Take 1 tablet (100 mg total) by mouth daily. 12/30/11 12/29/12 Yes Bruce Kendall Flack, MD  Multiple Vitamin (MULTIVITAMIN WITH MINERALS) TABS Take 1 tablet by mouth daily.   Yes Historical Provider, MD  SYNTHROID 88 MCG tablet TAKE 1 TABLET DAILY 11/25/11  Yes Lisabeth Pick, MD  traMADol (ULTRAM) 50 MG tablet Take 50 mg by mouth 2 (two) times daily as needed. For pain.   Yes Historical Provider, MD  verapamil (CALAN-SR) 240 MG CR tablet Take 360 mg by mouth daily.   Yes Historical Provider, MD    Current Facility-Administered Medications  Medication Dose Route Frequency Provider Last Rate Last Dose  . 0.9 %  sodium chloride infusion   Intravenous Once Kalman Drape, MD 20 mL/hr at 07/08/12 0034    . 0.9 %  sodium chloride infusion   Intravenous Continuous Melton Alar, PA 100 mL/hr at 07/08/12 0849    . acetaminophen (TYLENOL) tablet 500 mg  500 mg Oral Q6H PRN Arlyss Repress, MD      . aspirin EC tablet 81 mg  81 mg Oral Daily Arlyss Repress, MD      .  ciprofloxacin (CIPRO) IVPB 400 mg  400 mg Intravenous Once Kalman Drape, MD   400 mg at 07/08/12 0520  . ciprofloxacin (CIPRO) IVPB 400 mg  400 mg Intravenous Q24H Melton Alar, PA      . cycloSPORINE (RESTASIS) 0.05 % ophthalmic emulsion 1 drop  1 drop Both Eyes BID Arlyss Repress, MD      . insulin aspart (novoLOG) injection 0-9 Units  0-9 Units Subcutaneous Q4H Arlyss Repress, MD      . iohexol (OMNIPAQUE) 300 MG/ML solution 100 mL  100 mL Intravenous Once PRN Medication Radiologist, MD   100 mL at 07/08/12 0310  . levothyroxine (SYNTHROID, LEVOTHROID) tablet 88 mcg  88 mcg Oral QAC breakfast Arlyss Repress, MD   88 mcg at 07/08/12 [phone]  . losartan (COZAAR)  tablet 100 mg  100 mg Oral Daily Arlyss Repress, MD      . metroNIDAZOLE (FLAGYL) IVPB 500 mg  500 mg Intravenous Once Kalman Drape, MD   500 mg at 07/08/12 0451  . metroNIDAZOLE (FLAGYL) IVPB 500 mg  500 mg Intravenous Q8H Annita Brod, MD      . multivitamin with minerals tablet 1 tablet  1 tablet Oral Daily Arlyss Repress, MD      . traMADol Veatrice Bourbon) tablet 50 mg  50 mg Oral BID PRN Arlyss Repress, MD      . verapamil (CALAN-SR) CR tablet 360 mg  360 mg Oral Daily Arlyss Repress, MD      . vitamin C (ASCORBIC ACID) tablet 500 mg  500 mg Oral Daily Arlyss Repress, MD      . DISCONTD: clotrimazole [facility]) troche 10 mg  10 mg Oral 5 X Daily Chauncey Cruel, MD      . DISCONTD: insulin aspart (novoLOG) injection 0-9 Units  0-9 Units Subcutaneous TID WC Arlyss Repress, MD      . DISCONTD: metroNIDAZOLE (FLAGYL) IVPB 500 mg  500 mg Intravenous Q8H Melton Alar, PA        Allergies as of 07/07/2012 - Review Complete 07/07/2012  Allergen Reaction Noted  . Lisinopril  12/24/2006  . Penicillins  12/24/2006    Family History  Problem Relation Age of Onset  . Prostate cancer Brother   . Cancer Brother     renal cell carcinoma  . Heart disease Brother   . Cancer Mother     breast cancer  . Cancer Father     unknown kind    History   Social History  . Marital Status: Married    Spouse Name: N/A    Number of Children: N/A  . Years of Education: N/A   Occupational History  . Not on file.   Social History Main Topics  . Smoking status: Never Smoker   . Smokeless tobacco: Not on file  . Alcohol Use: No  . Drug Use: No  . Sexually Active: Not on file   .   Review of Systems: All systems reviewed and negative except where noted in the history of present illness  PHYSICAL EXAM: Vital signs in last 24 hours: Temp:  [97.8 F (36.6 C)-99.4 F (37.4 C)] 99.1 F (37.3 C) (07/24 0701) Pulse Rate:  [78-118] 105  (07/24 0701) Resp:  [18-24] 18  (07/24 0701) BP: (122-152)/(55-80)  138/78 mmHg (07/24 0701) SpO2:  [95 %-97 %] 95 % (07/24 0701) Weight:  [135 lb (61.236 kg)-139 lb (63.05 kg)] 135 lb (61.236 kg) (07/24  0701)   General:   Pleasant, [name] female in NAD Head:  Normocephalic and atraumatic. Eyes:   No icterus.   Conjunctiva pink. Ears:  Normal auditory acuity. .Neck:  Supple; no masses felt Lungs:  Respirations even and unlabored. Lungs clear to auscultation bilaterally.   No wheezes, crackles, or rhonchi.  Heart:  Regular rate and rhythm; soft murmur heard. Abdomen:  Soft, nondistended, nontender. Normal bowel sounds. No appreciable masses or hepatomegaly.  Rectal:  Patchulous anus. Scant light brown fecal material in vault. No obvious lesions felt.  Msk:  Symmetrical without gross deformities.  Extremities:  Without edema. Neurologic:  Alert and  oriented x4;  grossly normal neurologically. Skin:  Intact without significant lesions or rashes. Cervical Nodes:  No significant cervical adenopathy. Psych:  Alert and cooperative. Normal affect.  LAB RESULTS:  Basename 07/08/12 0030  WBC 18.6*  HGB 11.0*  HCT 33.1*  PLT 360   BMET  Basename 07/08/12 0030  NA 141  K 4.5  CL 101  CO2 24  GLUCOSE 141*  BUN 31*  CREATININE 1.19*  CALCIUM 10.2    STUDIES: Ct Abdomen Pelvis W Contrast  07/08/2012  *RADIOLOGY REPORT*  Clinical Data: Constipation.  Leukocytosis.  Diabetes. Diverticulosis.  CT ABDOMEN AND PELVIS WITH CONTRAST  Technique:  Multidetector CT imaging of the abdomen and pelvis was performed following the standard protocol during bolus administration of intravenous contrast.  Contrast: 114mL OMNIPAQUE IOHEXOL 300 MG/ML  SOLN  Comparison: 07/07/2012  Findings: Small hiatal hernia noted.  The liver, spleen, and pancreas appear unremarkable.  The adrenal glands and gallbladder appear normal.  Bilateral renal cysts noted, with the dominant cyst measuring 5.5 x 5.2 x 6.6 cm in the left kidney lower pole.  No worrisome enhancement noted, although  there is scarring in the right kidney upper pole with marginal punctate calcification, likely dystrophic.  Severe levoconvex lumbar scoliosis noted with associated spondylosis.  No pathologic retroperitoneal or porta hepatis adenopathy is identified.  No pathologic pelvic adenopathy is identified.  Aortoiliac atherosclerotic calcification noted.  Orally administered contrast extends through to the colon.  The appendix is surgically absent.  Several borderline thick-walled loops of mid abdominal small bowel are present.  Sigmoid diverticulosis is present without active diverticulitis.  Abnormal wall thickening in the rectum may be due to tumor or inflammation.  There is adjacent stranding in the perirectal adipose tissue.  Mild stranding in the perianal soft tissues noted.  Right foraminal stenosis at L3-4 noted with severe loss of intervertebral disc height at L2-3 and L3-4.  Prior hysterectomy noted.  IMPRESSION:  1.  Marked rectal wall thickening, possibly from prominent inflammation or tumor.  No definite extraluminal gas is observed, but there is stranding in the perirectal space.  No definite perirectal abscess. 2.  Sigmoid diverticulosis without active diverticulitis. 3.  Severe lumbar spondylosis and scoliosis. 4.  Bilateral renal cysts.  Right kidney upper pole scarring with dystrophic calcification. 5.  Small hiatal hernia.  Original Report Authenticated By: Carron Curie, M.D.   Dg Abd Acute W/chest  07/07/2012  *RADIOLOGY REPORT*  Clinical Data: Abdominal bloating, pain and constipation.  ACUTE ABDOMEN SERIES (ABDOMEN 2 VIEW & CHEST 1 VIEW)  Comparison: None.  Findings: There is a severe scoliosis of the spine.  The lungs are clear.  Heart size is normal.  The abdominal films show no evidence of bowel obstruction or significant ileus.  No free air.  No abnormal calcifications.  Clips are present in the pelvis.  IMPRESSION:  No acute abnormalities.  Severe scoliosis.  Original Report Authenticated  By: Azzie Roup, M.D.     PREVIOUS ENDOSCOPIES:  Colonoscopy by Dr. Ardis Hughs July 2010 done for surveillance of adenomatous polyps. Extent of exam was to the cecum, prep was good. Findings included moderate diverticulosis throughout the colon and a single small polyp, path =tubular adenoma. Based on patient's age, and no surveillance colonoscopy was recommended.  IMPRESSION / PLAN: 1. Acute constipation in the setting of a contrast CT scan of the abdomen and pelvis revealing marked in rectal wall thickening, possibly inflammation or tumor. No extraluminal gas but there is stranding in the perirectal space.No obvious abnormalities on digital exam. Neoplasm seems less likely given patient had a colonoscopy 3 years ago. Proctitis seems less likely in the absence of diarrhea. Rule out stercoral colitis. Diarrhea resolved after magnesium citrate. For further evaluation of leukocytosis patient will be hospitalized until at least tomorrow. We discussed flexible sigmoidoscopy for further evaluation of CAT scan findings. Patient is agreeable, flexible sigmoidoscopy will be done tomorrow afternoon.  Continue antibiotics for time being.   2. Chronic, intermittent low volume rectal bleeding with BMs. This has been going on for years and likely unrelated to #1.  3.  Leukocytosis, WBC has risen from 7.3 on 06/30/12 to 18.6. She has a low grade temperature. Urinalysis unremarkable. No acute findings on chest x-ray. Is this reactive leukocytosis secondary to #1. Is this related to history of myeloproliferative disorder?  3. History of adenomatous colon polyps, last colonoscopy with polypectomy was July 2010. No future surveillance colonoscopy planned secondary to patient's advanced age.  4. History of myeloproliferative disorder followed by hematology.  Patient on Aranesp, hemoglobin stable.  5. Diverticulosis.   6. History of melanoma  Thanks   LOS: 1 day   Tye Savoy  07/08/2012, 10:28  AM

## 2012-07-08 NOTE — ED Notes (Signed)
Pt had explosive/large amount of loose stool--- pt has had Magnesium citrate and laxatives prior to coming to ED, has had no BM since Saturday; pt states she feels better,  Denies any abdominal pain or discomfort.

## 2012-07-08 NOTE — Progress Notes (Signed)
Patient ID: Brittany [name], female   DOB: Apr 27, 1924, 78 y.o.   MRN: [id number] [facility] PROGRESS NOTE  Brittany [name] [id number] DOB: Dec 02, 1924 DOA: 07/07/2012 PCP: Chancy Hurter, MD  Assessment/Plan:  Constipation / Rectal wall thickening on CT.  After days with no BM, the patient had an explosive bowel movement last night.  Feeling better.  [facility] consulted this am.  Patient currently on clear liquids.  BRBPR.  Patient with history of diverticulosis and colon polyps.  Had BRBPR with explosive BM.  Per patient it was a large amt of BRB on Tissue paper after BM.  (Hemorrhoids vs proctitis?).  Hgb stable at 11.0  Leukocytosis. (18.6) Secondary to problem in colon.  Baseline WBC 4 - 6.  Received cipro and flagyl in the ED.  Will continue cipro/flagyl for now and ask GI about ongoing antibiotics.  Hypertension.  Controlled on verapamil  Hypothyroidism.  Continue synthroid  Myeloproliferative disorder.  Appears stable.  Will let Dr. Beryle Beams know she is in [place].  Code Status: Full Code Family Communication: Patient A&O, Husband at bedside during our discussion. Disposition Plan: Home when medically ready.  Brief narrative: 78 yo female Dm2, Hyperlipidemia, Hypothyroidism, Anemia ( ?myeloproliferative do), C/o constipation starting this past weekend. Typically she has 2-3 bm per day, and then this weekend couldn't have a bm. Slight brbpr, on toilet paper. (chronic issue) Denies fever, chills, n/v, abd pain, black stool. CT scan abd/pelvis=>marked rectal thickening ddx tumor vs inflammation. Full report below. Last colonscopy w/in the past 2 years. => ? . ER asked that we admit the patient for evaluation  Consultants:  [facility], also a message has been left for Dr. Beryle Beams to let him know she is here.  Procedures:    Antibiotics:  Cipro / Flagyl IV, started at admission.  HPI/Subjective: Feeling better after BM, would be happy to go home today.  Tired.  Didn't  sleep much last night.  Objective: Filed Vitals:   07/07/12 2350 07/08/12 0335 07/08/12 0625 07/08/12 0701  BP: 149/72 152/63 123/55 138/78  Pulse: 118 102 102 105  Temp: 97.8 F (36.6 C)  98.5 F (36.9 C) 99.1 F (37.3 C)  TempSrc: Oral  Oral Axillary  Resp:  24  18  Height:    5' (1.524 m)  Weight:    61.236 kg (135 lb)  SpO2: 96% 97% 96% 95%    Intake/Output Summary (Last 24 hours) at 07/08/12 [id number] Last data filed at 07/08/12 0249  Gross per 24 hour  Intake      0 ml  Output    600 ml  Net   -600 ml    Exam:   General:  A&O, NAD, lying comfortably in bed  Cardiovascular: RRR, No M/R/G, no Lower Extremity Edema  Respiratory: CTA, No increased work of breathing  Abdomen: Soft, NT, slight distention (likely normal for her), active bowel sounds, no obvious masses  Neurological:  Cranial Nerves 2 - 12 are grossly intact, exam non focal  Psychiatric:  A&O, Cooperative, Well Groomed  Data Reviewed: Basic Metabolic Panel:  Lab [id number] 0030  NA 141  K 4.5  CL 101  CO2 24  GLUCOSE 141*  BUN 31*  CREATININE 1.19*  CALCIUM 10.2  MG --  PHOS --   CBC:  Lab 07/08/12 0030  WBC 18.6*  NEUTROABS 16.4*  HGB 11.0*  HCT 33.1*  MCV 86.4  PLT 360   CBG:  Lab 07/08/12 0751  GLUCAP 104*      Studies:  Ct Abdomen Pelvis W Contrast  07/08/2012  *RADIOLOGY REPORT*  Clinical Data: Constipation.  Leukocytosis.  Diabetes. Diverticulosis.  CT ABDOMEN AND PELVIS WITH CONTRAST  Technique:  Multidetector CT imaging of the abdomen and pelvis was performed following the standard protocol during bolus administration of intravenous contrast.  Contrast: 144mL OMNIPAQUE IOHEXOL 300 MG/ML  SOLN  Comparison: 07/07/2012  Findings: Small hiatal hernia noted.  The liver, spleen, and pancreas appear unremarkable.  The adrenal glands and gallbladder appear normal.  Bilateral renal cysts noted, with the dominant cyst measuring 5.5 x 5.2 x 6.6 cm in the left kidney lower pole.  No  worrisome enhancement noted, although there is scarring in the right kidney upper pole with marginal punctate calcification, likely dystrophic.  Severe levoconvex lumbar scoliosis noted with associated spondylosis.  No pathologic retroperitoneal or porta hepatis adenopathy is identified.  No pathologic pelvic adenopathy is identified.  Aortoiliac atherosclerotic calcification noted.  Orally administered contrast extends through to the colon.  The appendix is surgically absent.  Several borderline thick-walled loops of mid abdominal small bowel are present.  Sigmoid diverticulosis is present without active diverticulitis.  Abnormal wall thickening in the rectum may be due to tumor or inflammation.  There is adjacent stranding in the perirectal adipose tissue.  Mild stranding in the perianal soft tissues noted.  Right foraminal stenosis at L3-4 noted with severe loss of intervertebral disc height at L2-3 and L3-4.  Prior hysterectomy noted.  IMPRESSION:  1.  Marked rectal wall thickening, possibly from prominent inflammation or tumor.  No definite extraluminal gas is observed, but there is stranding in the perirectal space.  No definite perirectal abscess. 2.  Sigmoid diverticulosis without active diverticulitis. 3.  Severe lumbar spondylosis and scoliosis. 4.  Bilateral renal cysts.  Right kidney upper pole scarring with dystrophic calcification. 5.  Small hiatal hernia.  Original Report Authenticated By: Carron Curie, M.D.   Dg Abd Acute W/chest  07/07/2012  *RADIOLOGY REPORT*  Clinical Data: Abdominal bloating, pain and constipation.  ACUTE ABDOMEN SERIES (ABDOMEN 2 VIEW & CHEST 1 VIEW)  Comparison: None.  Findings: There is a severe scoliosis of the spine.  The lungs are clear.  Heart size is normal.  The abdominal films show no evidence of bowel obstruction or significant ileus.  No free air.  No abnormal calcifications.  Clips are present in the pelvis.  IMPRESSION: No acute abnormalities.  Severe  scoliosis.  Original Report Authenticated By: Azzie Roup, M.D.    Scheduled Meds:    . sodium chloride   Intravenous Once  . aspirin EC  81 mg Oral Daily  . ciprofloxacin  400 mg Intravenous Once  . cycloSPORINE  1 drop Both Eyes BID  . insulin aspart  0-9 Units Subcutaneous Q4H  . levothyroxine  88 mcg Oral QAC breakfast  . losartan  100 mg Oral Daily  . metronidazole  500 mg Intravenous Once  . multivitamin with minerals  1 tablet Oral Daily  . verapamil  360 mg Oral Daily  . vitamin C  500 mg Oral Daily  . DISCONTD: insulin aspart  0-9 Units Subcutaneous TID WC   Continuous Infusions:    . sodium chloride 100 mL/hr at 07/08/12 [id number]     Karen Kitchens [facility] Pager [phone]  If 7PM-7AM, please contact night-coverage [email or web address] Password [email or web address] 07/08/2012, 9:42 AM   LOS: 1 day

## 2012-07-08 NOTE — Progress Notes (Signed)
Patient seen, examined and discussed with my PA. She is certainly feeling better after her bowel movement. Discussed with gastroenterology and the plan will be for a flex sig tomorrow. Suspect this may be more infectious in nature given acute leukocytosis (less suspicious that leukocytosis is related to her myeloproliferative disorder given differential with predominance of neutrophils). Plan to recheck blood work tomorrow morning and continue patient on clear liquid diet. Otherwise stable.

## 2012-07-08 NOTE — H&P (Addendum)
Brittany [name] is an 78 y.o. female.    Pcp: Phoebe Sharps GI:  Ardis Hughs  Chief Complaint: constipation HPI: 78 yo female Dm2, Hyperlipidemia, Hypothyroidism, Anemia ( ?myeloproliferative do),  C/o constipation starting this past weekend. Typically she has 2-3 bm per day, and then this weekend couldn't have a bm.  Slight brbpr, on toilet paper. (chronic issue)   Denies fever, chills, n/v, abd pain,  black stool.   CT scan abd/pelvis=>marked rectal thickening ddx tumor vs inflammation.  Full report below. Last colonscopy w/in the past 2 years.  =>  ?  .   ER asked that we admit the patient for evaluation.     Past Medical History  Diagnosis Date  . Melanoma     x2  . Diabetes mellitus   . Diverticulosis of colon   . Hyperlipidemia   . Hypothyroidism   . Osteoporosis   . Blood transfusion 2006  . Myeloproliferative disorder 02/04/2012  . Normochromic normocytic anemia 02/04/2012    Past Surgical History  Procedure Date  . Polypectomy     Laparoscopic  . Appendectomy   . Carpal tunnel release   . Melanoma excision   . Replacement total knee   . Rectocele repair   . Vaginal hysterectomy     Family History  Problem Relation Age of Onset  . Prostate cancer Brother   . Cancer Brother     renal cell carcinoma  . Heart disease Brother   . Cancer Mother     breast cancer  . Cancer Father     unknown kind   Social History:  reports that she has never smoked. She does not have any smokeless tobacco history on file. She reports that she does not drink alcohol or use illicit drugs.  Allergies:  Allergies  Allergen Reactions  . Lisinopril     REACTION: angioedema  . Penicillins     REACTION: rash     (Not in a hospital admission)  Results for orders placed during the hospital encounter of 07/07/12 (from the past 48 hour(s))  CBC     Status: Abnormal   Collection Time   07/08/12 12:30 AM      Component Value Range Comment   WBC 18.6 (*) 4.0 - 10.5 K/uL REPEATED TO VERIFY   RBC 3.83 (*) 3.87 - 5.11 MIL/uL    Hemoglobin 11.0 (*) 12.0 - 15.0 g/dL    HCT 33.1 (*) 36.0 - 46.0 %    MCV 86.4  78.0 - 100.0 fL    MCH 28.7  26.0 - 34.0 pg    MCHC 33.2  30.0 - 36.0 g/dL    RDW 17.5 (*) 11.5 - 15.5 %    Platelets 360  150 - 400 K/uL   DIFFERENTIAL     Status: Abnormal   Collection Time   07/08/12 12:30 AM      Component Value Range Comment   Neutrophils Relative 89 (*) 43 - 77 %    Neutro Abs 16.4 (*) 1.7 - 7.7 K/uL    Lymphocytes Relative 3 (*) 12 - 46 %    Lymphs Abs 0.5 (*) 0.7 - 4.0 K/uL    Monocytes Relative 8  3 - 12 %    Monocytes Absolute 1.5 (*) 0.1 - 1.0 K/uL    Eosinophils Relative 1  0 - 5 %    Eosinophils Absolute 0.1  0.0 - 0.7 K/uL    Basophils Relative 0  0 - 1 %    Basophils Absolute  0.1  0.0 - 0.1 K/uL   BASIC METABOLIC PANEL     Status: Abnormal   Collection Time   07/08/12 12:30 AM      Component Value Range Comment   Sodium 141  135 - 145 mEq/L    Potassium 4.5  3.5 - 5.1 mEq/L    Chloride 101  96 - 112 mEq/L    CO2 24  19 - 32 mEq/L    Glucose, Bld 141 (*) 70 - 99 mg/dL    BUN 31 (*) 6 - 23 mg/dL    Creatinine, Ser 1.19 (*) 0.50 - 1.10 mg/dL    Calcium 10.2  8.4 - 10.5 mg/dL    GFR calc non Af Amer 40 (*) >90 mL/min    GFR calc Af Amer 47 (*) >90 mL/min   LACTIC ACID, PLASMA     Status: Normal   Collection Time   07/08/12 12:30 AM      Component Value Range Comment   Lactic Acid, Venous 1.5  0.5 - 2.2 mmol/L   URINALYSIS, ROUTINE W REFLEX MICROSCOPIC     Status: Abnormal   Collection Time   07/08/12  2:57 AM      Component Value Range Comment   Color, Urine YELLOW  YELLOW    APPearance CLEAR  CLEAR    Specific Gravity, Urine 1.020  1.005 - 1.030    pH 6.0  5.0 - 8.0    Glucose, UA NEGATIVE  NEGATIVE mg/dL    Hgb urine dipstick NEGATIVE  NEGATIVE    Bilirubin Urine NEGATIVE  NEGATIVE    Ketones, ur 15 (*) NEGATIVE mg/dL    Protein, ur NEGATIVE  NEGATIVE mg/dL    Urobilinogen, UA 1.0  0.0 - 1.0 mg/dL    Nitrite NEGATIVE   NEGATIVE    Leukocytes, UA NEGATIVE  NEGATIVE MICROSCOPIC NOT DONE ON URINES WITH NEGATIVE PROTEIN, BLOOD, LEUKOCYTES, NITRITE, OR GLUCOSE <1000 mg/dL.   Ct Abdomen Pelvis W Contrast  07/08/2012  *RADIOLOGY REPORT*  Clinical Data: Constipation.  Leukocytosis.  Diabetes. Diverticulosis.  CT ABDOMEN AND PELVIS WITH CONTRAST  Technique:  Multidetector CT imaging of the abdomen and pelvis was performed following the standard protocol during bolus administration of intravenous contrast.  Contrast: 160mL OMNIPAQUE IOHEXOL 300 MG/ML  SOLN  Comparison: 07/07/2012  Findings: Small hiatal hernia noted.  The liver, spleen, and pancreas appear unremarkable.  The adrenal glands and gallbladder appear normal.  Bilateral renal cysts noted, with the dominant cyst measuring 5.5 x 5.2 x 6.6 cm in the left kidney lower pole.  No worrisome enhancement noted, although there is scarring in the right kidney upper pole with marginal punctate calcification, likely dystrophic.  Severe levoconvex lumbar scoliosis noted with associated spondylosis.  No pathologic retroperitoneal or porta hepatis adenopathy is identified.  No pathologic pelvic adenopathy is identified.  Aortoiliac atherosclerotic calcification noted.  Orally administered contrast extends through to the colon.  The appendix is surgically absent.  Several borderline thick-walled loops of mid abdominal small bowel are present.  Sigmoid diverticulosis is present without active diverticulitis.  Abnormal wall thickening in the rectum may be due to tumor or inflammation.  There is adjacent stranding in the perirectal adipose tissue.  Mild stranding in the perianal soft tissues noted.  Right foraminal stenosis at L3-4 noted with severe loss of intervertebral disc height at L2-3 and L3-4.  Prior hysterectomy noted.  IMPRESSION:  1.  Marked rectal wall thickening, possibly from prominent inflammation or tumor.  No definite extraluminal gas  is observed, but there is stranding in the  perirectal space.  No definite perirectal abscess. 2.  Sigmoid diverticulosis without active diverticulitis. 3.  Severe lumbar spondylosis and scoliosis. 4.  Bilateral renal cysts.  Right kidney upper pole scarring with dystrophic calcification. 5.  Small hiatal hernia.  Original Report Authenticated By: Carron Curie, M.D.   Dg Abd Acute W/chest  07/07/2012  *RADIOLOGY REPORT*  Clinical Data: Abdominal bloating, pain and constipation.  ACUTE ABDOMEN SERIES (ABDOMEN 2 VIEW & CHEST 1 VIEW)  Comparison: None.  Findings: There is a severe scoliosis of the spine.  The lungs are clear.  Heart size is normal.  The abdominal films show no evidence of bowel obstruction or significant ileus.  No free air.  No abnormal calcifications.  Clips are present in the pelvis.  IMPRESSION: No acute abnormalities.  Severe scoliosis.  Original Report Authenticated By: Azzie Roup, M.D.    Review of Systems  Constitutional: Negative for fever, chills, weight loss, malaise/fatigue and diaphoresis.  HENT: Negative for hearing loss, ear pain, nosebleeds, congestion, sore throat, neck pain, tinnitus and ear discharge.   Eyes: Negative for blurred vision, double vision, photophobia, pain, discharge and redness.  Respiratory: Negative for cough, hemoptysis, sputum production, shortness of breath, wheezing and stridor.   Cardiovascular: Negative for chest pain, palpitations, orthopnea, claudication, leg swelling and PND.  Gastrointestinal: Positive for constipation and blood in stool. Negative for heartburn, nausea, vomiting, abdominal pain, diarrhea and melena.  Genitourinary: Negative for dysuria, urgency, frequency, hematuria and flank pain.  Musculoskeletal: Negative for myalgias, back pain, joint pain and falls.  Skin: Negative for itching and rash.  Neurological: Negative for dizziness, tingling, tremors, sensory change, speech change, focal weakness, seizures, loss of consciousness, weakness and headaches.    Endo/Heme/Allergies: Negative for environmental allergies and polydipsia. Does not bruise/bleed easily.  Psychiatric/Behavioral: Negative for depression, suicidal ideas, hallucinations and substance abuse. The patient is not nervous/anxious and does not have insomnia.     Blood pressure 152/63, pulse 102, temperature 97.8 F (36.6 C), temperature source Oral, resp. rate 24, SpO2 97.00%. Physical Exam  Constitutional: She is oriented to person, place, and time. She appears well-developed and well-nourished. No distress.  HENT:  Head: Normocephalic and atraumatic.  Mouth/Throat: No oropharyngeal exudate.  Eyes: Conjunctivae are normal. Pupils are equal, round, and reactive to light. Right eye exhibits no discharge. Left eye exhibits no discharge. No scleral icterus.  Neck: Normal range of motion. Neck supple. No JVD present. No tracheal deviation present. No thyromegaly present.  Cardiovascular: Normal rate, regular rhythm and normal heart sounds.  Exam reveals no gallop and no friction rub.   No murmur heard. Respiratory: Effort normal and breath sounds normal. No stridor. No respiratory distress. She has no wheezes. She has no rales. She exhibits no tenderness.  GI: She exhibits no distension and no mass. There is no tenderness. There is no rebound and no guarding.  Musculoskeletal: Normal range of motion. She exhibits no edema and no tenderness.  Lymphadenopathy:    She has no cervical adenopathy.  Neurological: She is alert and oriented to person, place, and time. She has normal reflexes. She displays normal reflexes. No cranial nerve deficit. She exhibits normal muscle tone. Coordination normal.  Skin: Skin is warm and dry. No rash noted. She is not diaphoretic. No erythema. No pallor.  Psychiatric: She has a normal mood and affect. Her behavior is normal. Judgment and thought content normal.    Rectal exam performed:  ?  sligth thickening about 53mm x20mm on the anterior wall of the  rectum  Assessment/Plan Rectal Wall Thickening ddx inflammation (proctitis)  vs mass GI consult please this am Pt received cipro, flagyl in ED, defer on GI as to whether necessary to continue  Elevation in Wbc: Repeat cbc with diff this am CXR  Constipation: improved in ED  Dehydration: NS iv  Dm2: fsbs q4h iss  Hypothyroidism: cont levothyroxine  Kamica Florance 07/08/2012, 5:50 AM

## 2012-07-08 NOTE — Progress Notes (Signed)
CARE MANAGEMENT NOTE 07/08/2012  Patient:  ABRIAH, LIAKOS   Account Number:  [id number]  Date Initiated:  07/08/2012  Documentation initiated by:  DAVIS,RHONDA  Subjective/Objective Assessment:   pt admitted due to abd pain/mass/diverticulosis/conspitation, hx of pain and temp at thome wbc 18.6, prothrombin time elevated     Action/Plan:   from home   Anticipated DC Date:  07/11/2012   Anticipated DC Plan:  HOME/SELF CARE  In-house referral  NA      DC Planning Services  NA      [facility] Choice  NA   Choice offered to / List presented to:  NA   DME arranged  NA      DME agency  NA     [place] arranged  NA      [place] agency  NA   Status of service:  In process, will continue to follow Medicare Important Message given?  NA - LOS <3 / Initial given by admissions (If response is "NO", the following Medicare IM given date fields will be blank) Date Medicare IM given:   Date Additional Medicare IM given:    Discharge Disposition:    Per UR Regulation:  Reviewed for med. necessity/level of care/duration of stay  If discussed at [place] of Stay Meetings, dates discussed:    Comments:  [id number] Velva Harman, RN, BSN, CCM No discharge needs present at time of this review Case Management [phone]

## 2012-07-08 NOTE — Consult Note (Signed)
Chart was reviewed and patient was examined. X-rays were reviewed.    I agree with management and plans. Doubt significant lgi pathology.  She may have inflammation of the rectum or a stercorral ulcer.  Doubt neoplasm.  Plan F. sigmoidoscopy Sandy Salaam. Deatra Ina, M.D., [facility]

## 2012-07-08 NOTE — ED Provider Notes (Signed)
History     CSN: [id number]  Arrival date & time 07/07/12  2145   First MD Initiated Contact with Patient 07/07/12 2307      Chief Complaint  Patient presents with  . Abdominal Pain    (Consider location/radiation/quality/duration/timing/severity/associated sxs/prior treatment) Patient is a 78 y.o. female presenting with abdominal pain.  Abdominal Pain The primary symptoms of the illness include fever.  Additional symptoms associated with the illness include chills and constipation.   78 yo female presents to the ER c/o constipation.  Pt reports last BM was on Saturday, 3 days ago.  Pt normally has 2-3 bm per day.  Pt was seen in [facility] office today by nurse practioner, prescribed bottle of mag citrate.  Pt has also used ducolax suppository.  Pt has had small amounts of bm, but still feels full.  Husband reports this has been ongoing for longer than 3 days, possibly as long as a week.  Pt reports past history of diverticulosis seen on colonoscopy in the past.  No prior h/o diverticulitis.  Pt with fever to 101.4 today.  Poor appetite.  No fever, abd pain, no urinary symptoms.  No prior h/o similar sxs.  Past Medical History  Diagnosis Date  . Melanoma     x2  . Diabetes mellitus   . Diverticulosis of colon   . Hyperlipidemia   . Hypothyroidism   . Osteoporosis   . Blood transfusion 2006  . Myeloproliferative disorder 02/04/2012  . Normochromic normocytic anemia 02/04/2012    Past Surgical History  Procedure Date  . Polypectomy     Laparoscopic  . Appendectomy   . Carpal tunnel release   . Melanoma excision   . Replacement total knee   . Rectocele repair   . Vaginal hysterectomy     Family History  Problem Relation Age of Onset  . Prostate cancer Brother   . Cancer Brother     renal cell carcinoma  . Heart disease Brother   . Cancer Mother     breast cancer  . Cancer Father     unknown kind    History  Substance Use Topics  . Smoking status: Never Smoker   .  Smokeless tobacco: Not on file  . Alcohol Use: No    OB History    Grav Para Term Preterm Abortions TAB SAB Ect Mult Living                  Review of Systems  Constitutional: Positive for fever, chills and appetite change.  Gastrointestinal: Positive for constipation.  All other systems reviewed and are negative.    Allergies  Lisinopril and Penicillins  Home Medications  No current outpatient prescriptions on file.  BP 96/56  Pulse 79  Temp 98.5 F (36.9 C) (Axillary)  Resp 18  Ht 5' (1.524 m)  Wt 135 lb (61.236 kg)  BMI 26.37 kg/m2  SpO2 94%  Physical Exam  Constitutional: She appears well-developed and well-nourished. No distress.  HENT:  Head: Normocephalic and atraumatic.  Nose: Nose normal.  Mouth/Throat: Oropharynx is clear and moist. No oropharyngeal exudate.  Eyes: Conjunctivae are normal. Pupils are equal, round, and reactive to light.  Neck: Normal range of motion. Neck supple. No JVD present. No tracheal deviation present. No thyromegaly present.  Cardiovascular: Normal rate, regular rhythm, normal heart sounds and intact distal pulses.  Exam reveals no gallop and no friction rub.   No murmur heard. Pulmonary/Chest: Effort normal and breath sounds normal. No stridor.  No respiratory distress. She has no wheezes. She has no rales. She exhibits no tenderness.  Abdominal: Soft. She exhibits no distension and no mass. There is tenderness. There is no rebound and no guarding.       Hyperactive bowel sounds.  Diffuse abd tenderness, more over lower abd/suprapubic  Musculoskeletal: Normal range of motion. She exhibits no edema and no tenderness.  Lymphadenopathy:    She has no cervical adenopathy.  Skin: Skin is warm and dry. No rash noted. She is not diaphoretic. No erythema. No pallor.  Psychiatric: She has a normal mood and affect. Her behavior is normal. Judgment and thought content normal.    ED Course  Procedures (including critical care time)  Labs  Reviewed  CBC - Abnormal; Notable for the following:    WBC 18.6 (*)  REPEATED TO VERIFY   RBC 3.83 (*)     Hemoglobin 11.0 (*)     HCT 33.1 (*)     RDW 17.5 (*)     All other components within normal limits  DIFFERENTIAL - Abnormal; Notable for the following:    Neutrophils Relative 89 (*)     Neutro Abs 16.4 (*)     Lymphocytes Relative 3 (*)     Lymphs Abs 0.5 (*)     Monocytes Absolute 1.5 (*)     All other components within normal limits  BASIC METABOLIC PANEL - Abnormal; Notable for the following:    Glucose, Bld 141 (*)     BUN 31 (*)     Creatinine, Ser 1.19 (*)     GFR calc non Af Amer 40 (*)     GFR calc Af Amer 47 (*)     All other components within normal limits  URINALYSIS, ROUTINE W REFLEX MICROSCOPIC - Abnormal; Notable for the following:    Ketones, ur 15 (*)     All other components within normal limits  GLUCOSE, CAPILLARY - Abnormal; Notable for the following:    Glucose-Capillary 104 (*)     All other components within normal limits  APTT - Abnormal; Notable for the following:    aPTT 46 (*)     All other components within normal limits  GLUCOSE, CAPILLARY - Abnormal; Notable for the following:    Glucose-Capillary 147 (*)     All other components within normal limits  LACTIC ACID, PLASMA  HEMOGLOBIN A1C  PROTIME-INR   Ct Abdomen Pelvis W Contrast  07/08/2012  *RADIOLOGY REPORT*  Clinical Data: Constipation.  Leukocytosis.  Diabetes. Diverticulosis.  CT ABDOMEN AND PELVIS WITH CONTRAST  Technique:  Multidetector CT imaging of the abdomen and pelvis was performed following the standard protocol during bolus administration of intravenous contrast.  Contrast: 168mL OMNIPAQUE IOHEXOL 300 MG/ML  SOLN  Comparison: 07/07/2012  Findings: Small hiatal hernia noted.  The liver, spleen, and pancreas appear unremarkable.  The adrenal glands and gallbladder appear normal.  Bilateral renal cysts noted, with the dominant cyst measuring 5.5 x 5.2 x 6.6 cm in the left kidney  lower pole.  No worrisome enhancement noted, although there is scarring in the right kidney upper pole with marginal punctate calcification, likely dystrophic.  Severe levoconvex lumbar scoliosis noted with associated spondylosis.  No pathologic retroperitoneal or porta hepatis adenopathy is identified.  No pathologic pelvic adenopathy is identified.  Aortoiliac atherosclerotic calcification noted.  Orally administered contrast extends through to the colon.  The appendix is surgically absent.  Several borderline thick-walled loops of mid abdominal small bowel are present.  Sigmoid diverticulosis is present without active diverticulitis.  Abnormal wall thickening in the rectum may be due to tumor or inflammation.  There is adjacent stranding in the perirectal adipose tissue.  Mild stranding in the perianal soft tissues noted.  Right foraminal stenosis at L3-4 noted with severe loss of intervertebral disc height at L2-3 and L3-4.  Prior hysterectomy noted.  IMPRESSION:  1.  Marked rectal wall thickening, possibly from prominent inflammation or tumor.  No definite extraluminal gas is observed, but there is stranding in the perirectal space.  No definite perirectal abscess. 2.  Sigmoid diverticulosis without active diverticulitis. 3.  Severe lumbar spondylosis and scoliosis. 4.  Bilateral renal cysts.  Right kidney upper pole scarring with dystrophic calcification. 5.  Small hiatal hernia.  Original Report Authenticated By: Carron Curie, M.D.   Dg Abd Acute W/chest  07/07/2012  *RADIOLOGY REPORT*  Clinical Data: Abdominal bloating, pain and constipation.  ACUTE ABDOMEN SERIES (ABDOMEN 2 VIEW & CHEST 1 VIEW)  Comparison: None.  Findings: There is a severe scoliosis of the spine.  The lungs are clear.  Heart size is normal.  The abdominal films show no evidence of bowel obstruction or significant ileus.  No free air.  No abnormal calcifications.  Clips are present in the pelvis.  IMPRESSION: No acute  abnormalities.  Severe scoliosis.  Original Report Authenticated By: Azzie Roup, M.D.     1. Proctitis   2. Hypertension   3. Constipation   4. Type II or unspecified type diabetes mellitus without mention of complication, not stated as uncontrolled   5. Unspecified hypothyroidism   6. Unspecified essential hypertension       MDM  78 yo female with lower abd pain on exam, fever, constipation.  Concern for diverticulitis.  To have CT scan, labs.  Expect given comorbidities, if positive CT scan will need admission        Kalman Drape, MD 07/08/12 2050

## 2012-07-09 ENCOUNTER — Encounter (HOSPITAL_COMMUNITY)
Admission: EM | Disposition: A | Discharge status: Home / Self Care | Payer: Self-pay | Source: Home / Self Care | Attending: Internal Medicine

## 2012-07-09 ENCOUNTER — Encounter (HOSPITAL_COMMUNITY): Payer: Self-pay | Admitting: *Deleted

## 2012-07-09 DIAGNOSIS — K6289 Other specified diseases of anus and rectum: Secondary | ICD-10-CM | POA: Diagnosis present

## 2012-07-09 DIAGNOSIS — K626 Ulcer of anus and rectum: Secondary | ICD-10-CM | POA: Diagnosis not present

## 2012-07-09 HISTORY — PX: FLEXIBLE SIGMOIDOSCOPY: SHX5431

## 2012-07-09 LAB — BASIC METABOLIC PANEL
BUN: 31 mg/dL — ABNORMAL HIGH (ref 6–23)
Calcium: 9.2 mg/dL (ref 8.4–10.5)
Creatinine, Ser: 1.09 mg/dL (ref 0.50–1.10)
GFR calc Af Amer: 52 mL/min — ABNORMAL LOW (ref >90)

## 2012-07-09 LAB — CBC
MCHC: 32.9 g/dL (ref 30.0–36.0)
MCV: 86.3 fL (ref 78.0–100.0)
Platelets: 271 10*3/uL (ref 150–400)
RDW: 17.8 % — ABNORMAL HIGH (ref 11.5–15.5)
WBC: 15.2 10*3/uL — ABNORMAL HIGH (ref 4.0–10.5)

## 2012-07-09 LAB — GLUCOSE, CAPILLARY: Glucose-Capillary: 91 mg/dL (ref 70–99)

## 2012-07-09 LAB — HM SIGMOIDOSCOPY

## 2012-07-09 SURGERY — SIGMOIDOSCOPY, FLEXIBLE
Anesthesia: Moderate Sedation

## 2012-07-09 MED ORDER — FENTANYL CITRATE 0.05 MG/ML IJ SOLN
INTRAMUSCULAR | Status: DC | PRN
  Administered 2012-07-09: 25 ug via INTRAVENOUS

## 2012-07-09 MED ORDER — FENTANYL CITRATE 0.05 MG/ML IJ SOLN
INTRAMUSCULAR | Status: AC
  Filled 2012-07-09: qty 2

## 2012-07-09 MED ORDER — METRONIDAZOLE 500 MG PO TABS: 500.0000 mg | ORAL_TABLET | Freq: Three times a day (TID) | ORAL | Status: AC

## 2012-07-09 MED ORDER — MIDAZOLAM HCL 10 MG/2ML IJ SOLN
INTRAMUSCULAR | Status: AC
  Filled 2012-07-09: qty 4

## 2012-07-09 MED ORDER — FENTANYL CITRATE 0.05 MG/ML IJ SOLN
INTRAMUSCULAR | Status: AC
  Filled 2012-07-09: qty 4

## 2012-07-09 MED ORDER — MIDAZOLAM HCL 10 MG/2ML IJ SOLN
INTRAMUSCULAR | Status: AC
  Filled 2012-07-09: qty 2

## 2012-07-09 MED ORDER — MAGNESIUM CITRATE PO SOLN
0.5000 | Freq: Once | ORAL | Status: AC
  Administered 2012-07-09: 0.5 via ORAL
  Filled 2012-07-09: qty 296

## 2012-07-09 MED ORDER — CIPROFLOXACIN HCL 500 MG PO TABS: 500.0000 mg | ORAL_TABLET | Freq: Two times a day (BID) | ORAL | Status: AC

## 2012-07-09 MED ORDER — MIDAZOLAM HCL 10 MG/2ML IJ SOLN
INTRAMUSCULAR | Status: DC | PRN
  Administered 2012-07-09 (×3): 1 mg via INTRAVENOUS

## 2012-07-09 MED ORDER — POLYETHYLENE GLYCOL 3350 17 GM/SCOOP PO POWD: 17.0000 g | Freq: Every day | ORAL | Status: AC

## 2012-07-09 NOTE — Progress Notes (Signed)
Sigmoidoscopy report. Or marked inflammatory changes confined to the rectal vault diet account for the changes seen on CT. Biopsies are pending.  Recommendations #1 stool softeners #2 await biopsy results. Be followed up as an outpatient with Dr. Ardis Hughs. I suspect the changes are due to rectal prolapse.

## 2012-07-09 NOTE — Discharge Summary (Signed)
Physician Discharge Summary  Brittany [name] [id number] DOB: [birth year] DOA: 07/07/2012  PCP: Chancy Hurter, MD  Admit date: 07/07/2012 Discharge date: 07/09/2012  Recommendations for Outpatient Follow-up:  Keep stools soft, monitor hgb.  Follow up with Dr. Ardis Hughs on biopsy results.  Discharge Diagnoses:  Active Problems:  Proctitis  Constipation  Lower GI bleed  Anemia   HYPOTHYROIDISM  DIABETES MELLITUS, TYPE II  HYPERTENSION  DIVERTICULOSIS, COLON  COLONIC POLYPS, HX OF  Myeloproliferative disorder  Discharge Condition: Stable after flexible sigmoidoscopy this afternoon.  Will return to independent living at [facility] and have PCP and GI follow up.  Diet recommendation: Carb modified  Chief Complaint: constipation  HPI: 78 yo female Dm2, Hyperlipidemia, Hypothyroidism, Anemia ( ?myeloproliferative do), C/o constipation starting this past weekend. Typically she has 2-3 bm per day, and then this weekend couldn't have a bm. Slight brbpr, on toilet paper. (chronic issue) Denies fever, chills, n/v, abd pain, black stool. CT scan abd/pelvis=>marked rectal thickening ddx tumor vs inflammation.  Hospital Course:   Proctitis:  Ms. Tajima was admitted to a med surg bed and started on IV cipro and flagyl as her WBC was over 18.  She was given stool softeners and laxatives and had an explosive bowel movement which also contained some blood.  [facility] was consulted and performed a flexible sigmoidoscopy.  The procedure demonstrated swollen, friable, circumferential tissue in the rectal vault.  Dr. Deatra Ina felt this was proctitis likely secondary to rectal prolapse.  He recommended keeping the patient's stools soft (semi-formed), and following up on the biopsies in an outpatient appointment.  Normocytic Anemia.  The patient has myeloproliferative disorder, but also has bright red blood per rectum.  She is on aranesp, and should keep her stools soft to reduce the bleeding as  much as possible.  She will follow with GI and her PCP.  The patient's hypertension, hypothyroidism and diabetes were quiet and stable during this hospitalization.  Procedures:  Flexible sigmoidoscopy by Dr. Erskine Emery 07/09/12.  Results below.  Consultations:  [facility]  Discharge Exam: Filed Vitals:   07/09/12 1620  BP: 107/50  Pulse:   Temp:   Resp: 24   Filed Vitals:   07/09/12 1550 07/09/12 1600 07/09/12 1610 07/09/12 1620  BP: 95/47 101/51 101/53 107/50  Pulse:      Temp:      TempSrc:      Resp: 21 20 22 24   Height:      Weight:      SpO2: 99% 98% 98% 96%   General: Alert and orientated, Pleasant Cardiovascular: RRR no M/R/G Respiratory: CTA, no W/C/R Abdomen:  Soft, very active bowel sounds, slightly tender to palpation in lower quadrants  Discharge Instructions  Discharge Orders    Future Appointments: Provider: Department: Dept Phone: Center:   07/28/2012 1:30 PM [facility] [phone] None   07/28/2012 2:00 PM [facility] [phone] None   07/29/2012 3:45 PM Milus Banister, MD [facility] [phone] [facility]   08/25/2012 1:00 PM Phyllis Ginger Shumate [facility] [phone] None   08/25/2012 1:30 PM [facility] [phone] None   09/22/2012 1:00 PM Theda Sers [facility] [phone] None   09/22/2012 1:30 PM [facility] [phone] None   10/20/2012 1:30 PM [facility] [phone] None   10/20/2012 2:00 PM Annia Belt, MD [facility] [phone] None   10/20/2012 3:00 PM Shanon Payor [facility] [phone]  None   11/20/2012 9:00 AM Lisabeth Pick, MD [facility] [phone] [facility]     Future Orders Please Complete By Expires   Diet - low sodium heart healthy      Increase activity slowly      Discharge instructions      Comments:   Patient has chronic slight rectal bleeding causing  anemia.  Also has circumferential swelling of friable tissue in the rectum.  [facility] will follow her biopsies.     Medication List  As of 07/09/2012  4:33 PM   TAKE these medications         acetaminophen 500 MG tablet   Commonly known as: TYLENOL   Take 500 mg by mouth every 6 (six) hours as needed. For pain.      aspirin EC 81 MG tablet   Take 81 mg by mouth daily.      CALTRATE 600+D PLUS 600-400 MG-UNIT per tablet   Take 1 tablet by mouth daily.      ciprofloxacin 500 MG tablet   Commonly known as: CIPRO   Take 1 tablet (500 mg total) by mouth 2 (two) times daily.      cycloSPORINE 0.05 % ophthalmic emulsion   Commonly known as: RESTASIS   Place 1 drop into both eyes 2 (two) times daily.      losartan 100 MG tablet   Commonly known as: COZAAR   Take 1 tablet (100 mg total) by mouth daily.      metroNIDAZOLE 500 MG tablet   Commonly known as: FLAGYL   Take 1 tablet (500 mg total) by mouth 3 (three) times daily.      multivitamin with minerals Tabs   Take 1 tablet by mouth daily.      polyethylene glycol powder powder   Commonly known as: GLYCOLAX/MIRALAX   Take 17 g by mouth daily. MIRALAX. Take in 1/2 of liquid daily to keep stools soft.      SYNTHROID 88 MCG tablet   Generic drug: levothyroxine   TAKE 1 TABLET DAILY      traMADol 50 MG tablet   Commonly known as: ULTRAM   Take 50 mg by mouth 2 (two) times daily as needed. For pain.      verapamil 240 MG CR tablet   Commonly known as: CALAN-SR   Take 360 mg by mouth daily.      vitamin C 500 MG tablet   Commonly known as: ASCORBIC ACID   Take 500 mg by mouth daily.           Follow-up Information    Follow up with Owens Loffler, MD on 07/29/2012. (at 3:45pm)    Contact information:   [address] [place] [phone]       Follow up with Chancy Hurter, MD. Schedule an appointment as soon as possible for a visit in 2 weeks.    Contact information:   [place] [phone]           The results of significant diagnostics from this hospitalization (including imaging, microbiology, ancillary and laboratory) are listed below for reference.    Significant Diagnostic Studies:  Flexible Sigmoidoscopy results:  proctitis. There is severe inflammatory changes confined to the rectal vault characterized by a markedly edematous and friable mucosa. Changes as extended ALT and were circumferential. Because was friable and soft. Biopsies were taken (see image5 and image7). Severe diverticulosis was found  in the sigmoid colon (see image4).  Retroflexed views in the rectum revealed not performed. The rectum was narrowed. No attempts were made at retroflexion. The scope was then withdrawn from the patient and the procedure terminated.  ENDOSCOPIC IMPRESSION: Proctitis, possibly due to rectal prolapse  RECOMMENDATIONS: Await biopsy results, Stool softeners    Ct Abdomen Pelvis W Contrast  07/08/2012  *RADIOLOGY REPORT*  Clinical Data: Constipation.  Leukocytosis.  Diabetes. Diverticulosis.  CT ABDOMEN AND PELVIS WITH CONTRAST  Technique:  Multidetector CT imaging of the abdomen and pelvis was performed following the standard protocol during bolus administration of intravenous contrast.  Contrast: 162mL OMNIPAQUE IOHEXOL 300 MG/ML  SOLN  Comparison: 07/07/2012  Findings: Small hiatal hernia noted.  The liver, spleen, and pancreas appear unremarkable.  The adrenal glands and gallbladder appear normal.  Bilateral renal cysts noted, with the dominant cyst measuring 5.5 x 5.2 x 6.6 cm in the left kidney lower pole.  No worrisome enhancement noted, although there is scarring in the right kidney upper pole with marginal punctate calcification, likely dystrophic.  Severe levoconvex lumbar scoliosis noted with associated spondylosis.  No pathologic retroperitoneal or porta hepatis adenopathy is  identified.  No pathologic pelvic adenopathy is identified.  Aortoiliac atherosclerotic calcification noted.  Orally administered contrast extends through to the colon.  The appendix is surgically absent.  Several borderline thick-walled loops of mid abdominal small bowel are present.  Sigmoid diverticulosis is present without active diverticulitis.  Abnormal wall thickening in the rectum may be due to tumor or inflammation.  There is adjacent stranding in the perirectal adipose tissue.  Mild stranding in the perianal soft tissues noted.  Right foraminal stenosis at L3-4 noted with severe loss of intervertebral disc height at L2-3 and L3-4.  Prior hysterectomy noted.  IMPRESSION:  1.  Marked rectal wall thickening, possibly from prominent inflammation or tumor.  No definite extraluminal gas is observed, but there is stranding in the perirectal space.  No definite perirectal abscess. 2.  Sigmoid diverticulosis without active diverticulitis. 3.  Severe lumbar spondylosis and scoliosis. 4.  Bilateral renal cysts.  Right kidney upper pole scarring with dystrophic calcification. 5.  Small hiatal hernia.  Original Report Authenticated By: Carron Curie, M.D.   Dg Abd Acute W/chest  07/07/2012  *RADIOLOGY REPORT*  Clinical Data: Abdominal bloating, pain and constipation.  ACUTE ABDOMEN SERIES (ABDOMEN 2 VIEW & CHEST 1 VIEW)  Comparison: None.  Findings: There is a severe scoliosis of the spine.  The lungs are clear.  Heart size is normal.  The abdominal films show no evidence of bowel obstruction or significant ileus.  No free air.  No abnormal calcifications.  Clips are present in the pelvis.  IMPRESSION: No acute abnormalities.  Severe scoliosis.  Original Report Authenticated By: Azzie Roup, M.D.    Labs: Basic Metabolic Panel:  Lab [id number] 07/08/12 0030  NA 138 141  K 4.0 4.5  CL 102 101  CO2 22 24  GLUCOSE 130* 141*  BUN 31* 31*  CREATININE 1.09 1.19*  CALCIUM 9.2 10.2  MG -- --    PHOS -- --   CBC:  Lab 07/09/12 0355 07/08/12 0030  WBC 15.2* 18.6*  NEUTROABS -- 16.4*  HGB 9.5* 11.0*  HCT 28.9* 33.1*  MCV 86.3 86.4  PLT 271 360   CBG:  Lab 07/09/12 1321 07/09/12 0854 07/08/12 2103 07/08/12 1652 07/08/12 1150  GLUCAP 91 105* 146* 115* 147*    Time coordinating discharge: 40 min.  Signed:  Imogene Burn, PA-C [facility] Pager: [phone]  [facility] 07/09/2012, 4:33 PM

## 2012-07-09 NOTE — Interval H&P Note (Signed)
History and Physical Interval Note:  07/09/2012 3:21 PM  Brittany [name]  has presented today for surgery, with the diagnosis of abnormal CTscan pelvis and constipation  The various methods of treatment have been discussed with the patient and family. After consideration of risks, benefits and other options for treatment, the patient has consented to  Procedure(s) (LRB): FLEXIBLE SIGMOIDOSCOPY (N/A) as a surgical intervention .  The patient's history has been reviewed, patient examined, no change in status, stable for surgery.  I have reviewed the patient's chart and labs.  Questions were answered to the patient's satisfaction.    The recent H&P (dated *07/08/12**) was reviewed, the patient was examined and there is no change in the patients condition since that H&P was completed.   Erskine Emery  07/09/2012, 3:21 PM    Erskine Emery

## 2012-07-09 NOTE — Op Note (Signed)
[facility] [place], [place]  [id number]  FLEXIBLE SIGMOIDOSCOPY PROCEDURE REPORT  PATIENT:  Brittany [name], Brittany [name]  MR#:  [id number] BIRTHDATE:  17-Feb-1924, 47 yrs. old  GENDER:  female  ENDOSCOPIST:  Sandy Salaam. Deatra Ina, MD Referred by:  PROCEDURE DATE:  07/09/2012 PROCEDURE:  Flexible Sigmoidoscopy with biopsy ASA CLASS:  Class II INDICATIONS:  rectal bleeding, abnormal imaging  MEDICATIONS:   These medications were titrated to patient response per physician's verbal order, Fentanyl 25 mcg IV, Versed 3 mg IV  DESCRIPTION OF PROCEDURE:   After the risks benefits and alternatives of the procedure were thoroughly explained, informed consent was obtained.  Digital rectal exam was performed and revealed no abnormalities.   The  endoscope was introduced through the anus and advanced to the sigmoid colon, without limitations. The quality of the prep was .  The instrument was then slowly withdrawn as the mucosa was fully examined. <<PROCEDUREIMAGES>>  proctitis. There is severe inflammatory changes confined to the rectal vault characterized by a markedly edematous and friable mucosa. Changes as extended ALT and were circumferential. Because was friable and soft. Biopsies were taken (see image5 and image7). Severe diverticulosis was found in the sigmoid colon (see image4). Retroflexed views in the rectum revealed not performed.  The rectum was narrowed. No attempts were made at retroflexion.  The scope was then withdrawn from the patient and the procedure terminated.  COMPLICATIONS:  None  ENDOSCOPIC IMPRESSION: Proctitis, possibly due to rectal prolapse  RECOMMENDATIONS: Await biopsy results Stool softeners  REPEAT EXAM:  No  ______________________________ Sandy Salaam. Deatra Ina, MD  CC:  Owens Loffler, MD, Lisabeth Pick, MD  n. Lorrin Mais:   Sandy Salaam. Tomeika Weinmann at 07/09/2012 03:49 PM  Leilani Merl, [id number]

## 2012-07-10 ENCOUNTER — Encounter (HOSPITAL_COMMUNITY): Payer: Self-pay

## 2012-07-10 ENCOUNTER — Ambulatory Visit (INDEPENDENT_AMBULATORY_CARE_PROVIDER_SITE_OTHER): Payer: Medicare Other | Admitting: Family Medicine

## 2012-07-10 ENCOUNTER — Encounter: Payer: Self-pay | Admitting: Family Medicine

## 2012-07-10 ENCOUNTER — Telehealth: Payer: Self-pay | Admitting: Internal Medicine

## 2012-07-10 VITALS — BP 120/60 | HR 110 | Temp 98.3°F | Wt 140.0 lb

## 2012-07-10 DIAGNOSIS — K623 Rectal prolapse: Secondary | ICD-10-CM

## 2012-07-10 DIAGNOSIS — K6289 Other specified diseases of anus and rectum: Secondary | ICD-10-CM

## 2012-07-10 NOTE — Telephone Encounter (Signed)
Caller: Bryli/Patient; PCP: Phoebe Sharps; CB#: [phone]; ; ; Call regarding Infection in the Rectum/ Hemorrhoid (as Big As Golf Ball)/ Constipation Feeling;   Started 07/02/12, but no one looked at it in the hosp.  Has 1 large hemmoroid that is bleeding.  Pt has been straining on the toilet to have a BM.  Last Reg BM was 06/1912.  Triaged Rectal SX and disp = needs to be seen in the E/R,but requesting ofc visit as pt jsut got out fo the hosp 07/09/12.  appt made for 1315 with Fry for 07/10/12.  Call back inst given.

## 2012-07-10 NOTE — Discharge Summary (Signed)
Patient seen, examined and discussed with my PA. Results of flex sig reviewed.  Patient stable for discharge.

## 2012-07-10 NOTE — Progress Notes (Signed)
  Subjective:    Patient ID: Brittany [name], female    DOB: 02-Nov-1924, 78 y.o.   MRN: [id number]  HPI Here for follow up after a hospital stay from 07-07-12 to 07-09-12 for proctitis, constipation, and lower GI bleeding. On admission her WBC was 18 so she was started on Flagyl and Cipro, and she was sent home on these as well. No fevers. Flexible sigmoidoscopy revealed friable rectal tissues, and biopsy results are still pending. Her rectum is prolapsed. A CT scan revealed thickened rectal tissues and sigmoid diverticulosis without active infection. She is scheduled to follow up with Dr. Ardis Hughs in GI on 07-29-12. Last night her husband was looking at the anal area and saw some red tissue bulging out of the anus. He thinks it is a hemorrhoid and wants [place] to check it.    Review of Systems  Constitutional: Negative.   Gastrointestinal: Positive for rectal pain. Negative for nausea, vomiting, abdominal pain, diarrhea, constipation, blood in stool, abdominal distention and anal bleeding.       Objective:   Physical Exam  Constitutional: She appears well-developed and well-nourished.  Abdominal: Soft. Bowel sounds are normal. She exhibits no distension and no mass. There is no tenderness. There is no rebound and no guarding.  Genitourinary:       There is inflamed rectal tissue at the anal verge, no hemorrhoids           Assessment & Plan:  She has a prolapsed rectum and proctitis, and she is on appropriate treatment. I reassured the husband that what he saw is prolapsed rectal tissue and not hemorrhoids. She will follow up as scheduled.

## 2012-07-11 ENCOUNTER — Other Ambulatory Visit: Payer: Self-pay | Admitting: Internal Medicine

## 2012-07-13 ENCOUNTER — Encounter (HOSPITAL_COMMUNITY): Payer: Self-pay | Admitting: Gastroenterology

## 2012-07-13 ENCOUNTER — Telehealth: Payer: Self-pay | Admitting: Internal Medicine

## 2012-07-13 NOTE — Telephone Encounter (Signed)
PT daughter called back stating that the issue has been resolved

## 2012-07-13 NOTE — Telephone Encounter (Signed)
Patient's daughter (Dr. Bubba Hales), would like a call back from an MD, preferably Dr. Sarajane Jews as he saw the patient last to discuss surgery. Patient is on vacation with daughter who is caring for her during this time and she would like a call back today. Please assist.

## 2012-07-15 DIAGNOSIS — Z880 Allergy status to penicillin: Secondary | ICD-10-CM | POA: Diagnosis not present

## 2012-07-15 DIAGNOSIS — K623 Rectal prolapse: Secondary | ICD-10-CM | POA: Diagnosis not present

## 2012-07-15 DIAGNOSIS — Z7982 Long term (current) use of aspirin: Secondary | ICD-10-CM | POA: Diagnosis not present

## 2012-07-15 DIAGNOSIS — Z888 Allergy status to other drugs, medicaments and biological substances status: Secondary | ICD-10-CM | POA: Diagnosis not present

## 2012-07-15 DIAGNOSIS — Z79899 Other long term (current) drug therapy: Secondary | ICD-10-CM | POA: Diagnosis not present

## 2012-07-16 ENCOUNTER — Telehealth: Payer: Self-pay | Admitting: Family Medicine

## 2012-07-16 NOTE — Telephone Encounter (Signed)
Dr. Sarajane Jews, please see note from last night. She may be calling today, wanted you to be aware of situation. Thank you.   [facility] Triage Call Report Triage Record Num: [id number] Operator: Berniece Salines Patient Name: Brittany [name] Call Date & Time: 07/15/2012 5:03:44PM Patient Phone: [phone] PCP: Darrick Penna. Swords Patient Gender: Female PCP Fax : [phone] Patient DOB: 25-Oct-1924 Practice Name: [facility] Reason for Call: Caller: Janet/Other; PCP: Phoebe Sharps; CB#: [phone].; Call regarding Rectal Sxs; pt seen in the office 07/10/12, dx with rectal prolapse requiring surgery. Surgery scheduled for 07/20/12. Marcie Bal, daughter in law is requesting Dr. Sarajane Jews admit pt to [facility] for nursing care until the surgery. [facility] Shan or [place]. [phone] or [phone]) Instructed Marcie Bal to f/u with office when open. Protocol(s) Used: Office Note Recommended Outcome per Protocol: Information Noted and Sent to Office Reason for Outcome: Caller information to office Care Advice: ~ 07/

## 2012-07-16 NOTE — Telephone Encounter (Signed)
See my answer to the DEXA

## 2012-07-19 DIAGNOSIS — R4701 Aphasia: Secondary | ICD-10-CM | POA: Diagnosis not present

## 2012-07-19 DIAGNOSIS — D62 Acute posthemorrhagic anemia: Secondary | ICD-10-CM | POA: Diagnosis not present

## 2012-07-19 DIAGNOSIS — Z5331 Laparoscopic surgical procedure converted to open procedure: Secondary | ICD-10-CM | POA: Diagnosis not present

## 2012-07-19 DIAGNOSIS — F0390 Unspecified dementia without behavioral disturbance: Secondary | ICD-10-CM | POA: Diagnosis present

## 2012-07-19 DIAGNOSIS — I959 Hypotension, unspecified: Secondary | ICD-10-CM | POA: Diagnosis not present

## 2012-07-19 DIAGNOSIS — K623 Rectal prolapse: Secondary | ICD-10-CM | POA: Diagnosis not present

## 2012-07-19 DIAGNOSIS — R4182 Altered mental status, unspecified: Secondary | ICD-10-CM | POA: Diagnosis not present

## 2012-07-19 DIAGNOSIS — E46 Unspecified protein-calorie malnutrition: Secondary | ICD-10-CM | POA: Diagnosis not present

## 2012-07-19 DIAGNOSIS — R9431 Abnormal electrocardiogram [ECG] [EKG]: Secondary | ICD-10-CM | POA: Diagnosis not present

## 2012-07-19 DIAGNOSIS — R6510 Systemic inflammatory response syndrome (SIRS) of non-infectious origin without acute organ dysfunction: Secondary | ICD-10-CM | POA: Diagnosis not present

## 2012-07-19 DIAGNOSIS — Z6825 Body mass index (BMI) 25.0-25.9, adult: Secondary | ICD-10-CM | POA: Diagnosis not present

## 2012-07-19 DIAGNOSIS — D469 Myelodysplastic syndrome, unspecified: Secondary | ICD-10-CM | POA: Diagnosis present

## 2012-07-19 DIAGNOSIS — E7800 Pure hypercholesterolemia, unspecified: Secondary | ICD-10-CM | POA: Diagnosis present

## 2012-07-19 DIAGNOSIS — I10 Essential (primary) hypertension: Secondary | ICD-10-CM | POA: Diagnosis present

## 2012-07-19 DIAGNOSIS — I2789 Other specified pulmonary heart diseases: Secondary | ICD-10-CM | POA: Diagnosis not present

## 2012-07-19 DIAGNOSIS — D509 Iron deficiency anemia, unspecified: Secondary | ICD-10-CM | POA: Diagnosis not present

## 2012-07-19 DIAGNOSIS — R509 Fever, unspecified: Secondary | ICD-10-CM | POA: Diagnosis not present

## 2012-07-19 DIAGNOSIS — E119 Type 2 diabetes mellitus without complications: Secondary | ICD-10-CM | POA: Diagnosis not present

## 2012-07-19 DIAGNOSIS — R413 Other amnesia: Secondary | ICD-10-CM | POA: Diagnosis not present

## 2012-07-19 DIAGNOSIS — E039 Hypothyroidism, unspecified: Secondary | ICD-10-CM | POA: Diagnosis present

## 2012-07-19 DIAGNOSIS — K5900 Constipation, unspecified: Secondary | ICD-10-CM | POA: Diagnosis present

## 2012-07-19 DIAGNOSIS — Z8673 Personal history of transient ischemic attack (TIA), and cerebral infarction without residual deficits: Secondary | ICD-10-CM | POA: Diagnosis not present

## 2012-07-19 DIAGNOSIS — R918 Other nonspecific abnormal finding of lung field: Secondary | ICD-10-CM | POA: Diagnosis not present

## 2012-07-28 ENCOUNTER — Telehealth: Payer: Self-pay | Admitting: *Deleted

## 2012-07-28 ENCOUNTER — Ambulatory Visit: Payer: Medicare Other

## 2012-07-28 ENCOUNTER — Other Ambulatory Visit: Payer: Medicare Other | Admitting: Lab

## 2012-07-28 NOTE — Telephone Encounter (Signed)
Called patients home number.  Unable to leave a message.

## 2012-07-29 ENCOUNTER — Ambulatory Visit: Payer: Medicare Other | Admitting: Gastroenterology

## 2012-07-30 ENCOUNTER — Telehealth: Payer: Self-pay | Admitting: Internal Medicine

## 2012-07-30 DIAGNOSIS — E039 Hypothyroidism, unspecified: Secondary | ICD-10-CM | POA: Diagnosis not present

## 2012-07-30 DIAGNOSIS — D509 Iron deficiency anemia, unspecified: Secondary | ICD-10-CM | POA: Diagnosis not present

## 2012-07-30 DIAGNOSIS — D47Z9 Other specified neoplasms of uncertain behavior of lymphoid, hematopoietic and related tissue: Secondary | ICD-10-CM | POA: Diagnosis not present

## 2012-07-30 DIAGNOSIS — I10 Essential (primary) hypertension: Secondary | ICD-10-CM | POA: Diagnosis not present

## 2012-07-30 DIAGNOSIS — D62 Acute posthemorrhagic anemia: Secondary | ICD-10-CM | POA: Diagnosis not present

## 2012-07-30 DIAGNOSIS — F070 Personality change due to known physiological condition: Secondary | ICD-10-CM | POA: Diagnosis not present

## 2012-07-30 DIAGNOSIS — E785 Hyperlipidemia, unspecified: Secondary | ICD-10-CM | POA: Diagnosis not present

## 2012-07-30 DIAGNOSIS — D631 Anemia in chronic kidney disease: Secondary | ICD-10-CM | POA: Diagnosis not present

## 2012-07-30 DIAGNOSIS — K623 Rectal prolapse: Secondary | ICD-10-CM | POA: Diagnosis not present

## 2012-07-30 DIAGNOSIS — D4620 Refractory anemia with excess of blasts, unspecified: Secondary | ICD-10-CM | POA: Diagnosis not present

## 2012-07-30 DIAGNOSIS — E119 Type 2 diabetes mellitus without complications: Secondary | ICD-10-CM | POA: Diagnosis not present

## 2012-07-30 DIAGNOSIS — D469 Myelodysplastic syndrome, unspecified: Secondary | ICD-10-CM | POA: Diagnosis not present

## 2012-07-30 DIAGNOSIS — D649 Anemia, unspecified: Secondary | ICD-10-CM | POA: Diagnosis not present

## 2012-07-30 NOTE — Telephone Encounter (Signed)
I dont see this on med list

## 2012-07-30 NOTE — Telephone Encounter (Signed)
Pt was prescribed EPOETIN Alpha Inj. There is no dose or instructions. Pls call.

## 2012-07-31 NOTE — Telephone Encounter (Signed)
L/m on Rose, RN voicemail to call hematologist

## 2012-07-31 NOTE — Telephone Encounter (Signed)
This was prescribed by her hematologist (most likely)

## 2012-08-04 ENCOUNTER — Telehealth: Payer: Self-pay | Admitting: *Deleted

## 2012-08-04 ENCOUNTER — Telehealth: Payer: Self-pay | Admitting: Oncology

## 2012-08-04 DIAGNOSIS — D469 Myelodysplastic syndrome, unspecified: Secondary | ICD-10-CM

## 2012-08-04 NOTE — Telephone Encounter (Signed)
Gave pt appt for 08/05/12 lab and injections

## 2012-08-04 NOTE — Telephone Encounter (Signed)
Received call for Rose-RN from [facility] at [place] stating that pt Hgb 7.1 on 08/03/12  and wanted "Dr. Beryle Beams to see pt ASAP and for pt to get her Aranesp Inj." Rose-RN also didn't know if Dr. Beryle Beams was aware that pt had surgery for rectal prolapse on 07/19/12. Asked if pt symtomatic, per Rose-RN "pt denies SOB, no bleeding noted, and says she just tired." Informed Rose that Dr. Beryle Beams is out of the office this week and will discuss with Dr. Benay Spice.  Rose-RN will also fax over the Discharge Summary 07/30/12 and current labs.    1120 Reviewed with Dr. Benay Spice and also made him aware that pt missed her Aranesp scheduled for 07/28/12, orders received for labs 08/05/12 and Aranesp injection.  Returned call to Rose-RN at [facility] and confirmed that pt could be here 08/05/12 at 9:30 for labs/injection.

## 2012-08-05 ENCOUNTER — Other Ambulatory Visit (HOSPITAL_BASED_OUTPATIENT_CLINIC_OR_DEPARTMENT_OTHER): Payer: Medicare Other | Admitting: Lab

## 2012-08-05 ENCOUNTER — Ambulatory Visit (HOSPITAL_BASED_OUTPATIENT_CLINIC_OR_DEPARTMENT_OTHER): Payer: Medicare Other

## 2012-08-05 VITALS — BP 114/62 | HR 76 | Temp 98.6°F

## 2012-08-05 DIAGNOSIS — D631 Anemia in chronic kidney disease: Secondary | ICD-10-CM | POA: Diagnosis not present

## 2012-08-05 DIAGNOSIS — E785 Hyperlipidemia, unspecified: Secondary | ICD-10-CM | POA: Diagnosis not present

## 2012-08-05 DIAGNOSIS — N039 Chronic nephritic syndrome with unspecified morphologic changes: Secondary | ICD-10-CM | POA: Diagnosis not present

## 2012-08-05 DIAGNOSIS — D471 Chronic myeloproliferative disease: Secondary | ICD-10-CM

## 2012-08-05 DIAGNOSIS — D4620 Refractory anemia with excess of blasts, unspecified: Secondary | ICD-10-CM | POA: Diagnosis not present

## 2012-08-05 DIAGNOSIS — E039 Hypothyroidism, unspecified: Secondary | ICD-10-CM | POA: Diagnosis not present

## 2012-08-05 DIAGNOSIS — D62 Acute posthemorrhagic anemia: Secondary | ICD-10-CM | POA: Diagnosis not present

## 2012-08-05 DIAGNOSIS — F070 Personality change due to known physiological condition: Secondary | ICD-10-CM | POA: Diagnosis not present

## 2012-08-05 DIAGNOSIS — E119 Type 2 diabetes mellitus without complications: Secondary | ICD-10-CM | POA: Diagnosis not present

## 2012-08-05 LAB — CBC WITH DIFFERENTIAL/PLATELET
BASO%: 1.1 % (ref 0.0–2.0)
Basophils Absolute: 0 10*3/uL (ref 0.0–0.1)
EOS%: 2.1 % (ref 0.0–7.0)
HGB: 7.9 g/dL — ABNORMAL LOW (ref 11.6–15.9)
MCH: 29.5 pg (ref 25.1–34.0)
MCHC: 34.1 g/dL (ref 31.5–36.0)
MCV: 86.4 fL (ref 79.5–101.0)
MONO%: 8.3 % (ref 0.0–14.0)
NEUT%: 76 % (ref 38.4–76.8)
RDW: 16.9 % — ABNORMAL HIGH (ref 11.2–14.5)
lymph#: 0.5 10*3/uL — ABNORMAL LOW (ref 0.9–3.3)

## 2012-08-05 MED ORDER — DARBEPOETIN ALFA-POLYSORBATE 300 MCG/0.6ML IJ SOLN
300.0000 ug | Freq: Once | INTRAMUSCULAR | Status: AC
  Administered 2012-08-05: 300 ug via SUBCUTANEOUS
  Filled 2012-08-05: qty 0.6

## 2012-08-13 ENCOUNTER — Telehealth: Payer: Self-pay | Admitting: *Deleted

## 2012-08-13 ENCOUNTER — Telehealth: Payer: Self-pay | Admitting: Internal Medicine

## 2012-08-13 NOTE — Telephone Encounter (Signed)
Called & spoke with pt's husband at home & he is OK with giving information to Dr. Einar Gip who is his daughter-in-law.  He had her on skype at time of this call.  Pt is at home visiting now from [facility] & they are going to have dinner together.  Learned that pt had cbc done 08/11/12 & informed that hgb was 8/2 & this will be faxed to [place].  Mr. Popovich reports pt's color looks good, she can walk 50 ft with her walker & then sits to rest & then goes again, has no swelling & no chest pain, only c/o weak legs.  She will see Dr. Raliegh Ip tomorrow  @ 1:15 pm.  Will discuss with Dr Beryle Beams tomorrow to see if we need any blood transfusion.   Family satisfied with plan.

## 2012-08-13 NOTE — Telephone Encounter (Signed)
Received call from pt's daughter-in-law, Dr. Leventhal/anesthesiologist/[place] asking about mother-in-law.  She states that pt was in the hospital & lost some blood & had a syncopal episode & may have received epo & thinks she has been in our office for another shot.  She thinks she might need another epo shot or at least have labs before next appt 08/25/12 b/c she is still having syncopal episodes at the nursing home.  She has an appt. with Dr. Jamas Lav tomorrow to have BP checked.  Suggested that she call their office & have them check a cbc while she is there.  Will discuss with Dr Beryle Beams.  Dr. Caesar Chestnut call back # is [phone] & states OK to leave a message.

## 2012-08-13 NOTE — Telephone Encounter (Signed)
Pt has an appt with Dr Raliegh Ip tomorrow at 1:15 for hypotension.  Daughter-in-law is an Magazine features editor in [place].  She wanted Dr Leanne Chang to know that pt had a rectal prolapse and she has had a mental status change.  She was admitted to [facility].  There was also a 1.2 cm nodule or mass found on her kidney.  Daughter-in-law thinks that she has dementia and near syncope episodes.

## 2012-08-14 ENCOUNTER — Encounter: Payer: Self-pay | Admitting: Internal Medicine

## 2012-08-14 ENCOUNTER — Ambulatory Visit (INDEPENDENT_AMBULATORY_CARE_PROVIDER_SITE_OTHER): Payer: Medicare Other | Admitting: Internal Medicine

## 2012-08-14 VITALS — BP 90/60 | Temp 98.0°F | Wt 129.0 lb

## 2012-08-14 DIAGNOSIS — I10 Essential (primary) hypertension: Secondary | ICD-10-CM | POA: Diagnosis not present

## 2012-08-14 DIAGNOSIS — D649 Anemia, unspecified: Secondary | ICD-10-CM | POA: Diagnosis not present

## 2012-08-14 DIAGNOSIS — D471 Chronic myeloproliferative disease: Secondary | ICD-10-CM

## 2012-08-14 DIAGNOSIS — K5900 Constipation, unspecified: Secondary | ICD-10-CM

## 2012-08-14 DIAGNOSIS — D469 Myelodysplastic syndrome, unspecified: Secondary | ICD-10-CM | POA: Diagnosis not present

## 2012-08-14 DIAGNOSIS — D47Z9 Other specified neoplasms of uncertain behavior of lymphoid, hematopoietic and related tissue: Secondary | ICD-10-CM | POA: Diagnosis not present

## 2012-08-14 NOTE — Patient Instructions (Signed)
Discontinue verapamil  Down titrate MiraLax  Increase your level of activities slowly  Return in one month for follow-up

## 2012-08-14 NOTE — Progress Notes (Signed)
Subjective:    Patient ID: Brittany [name], female    DOB: 21-Oct-1924, 78 y.o.   MRN: [id number]  HPI  78 year old patient who has a complicated past medical history. She was recently discharged from [facility] after a rectopexy for a prolapsed rectum. She presently is convalesce in a friend's home nursing facility but will be transitioning back to rest home level early next week. There have been some recent issues with short-term memory loss over the past month. She has had some near syncopal episodes associated with hypotension and worsening confusion. She has a history of anemia secondary to myelodysplastic syndrome and does require periodic transfusions. She is followed by Dr. Beryle Beams. She also suffers from some constipation issues requiring MiraLax.  Past Medical History  Diagnosis Date  . Melanoma     x2  . Diabetes mellitus   . Diverticulosis of colon   . Hyperlipidemia   . Hypothyroidism   . Osteoporosis   . Blood transfusion 2006  . Myeloproliferative disorder 02/04/2012  . Normochromic normocytic anemia 02/04/2012    History   Social History  . Marital Status: Married    Spouse Name: N/A    Number of Children: N/A  . Years of Education: N/A   Occupational History  . Not on file.   Social History Main Topics  . Smoking status: Never Smoker   . Smokeless tobacco: Never Used  . Alcohol Use: No  . Drug Use: No  . Sexually Active: Not on file   Other Topics Concern  . Not on file   Social History Narrative  . No narrative on file    Past Surgical History  Procedure Date  . Polypectomy     Laparoscopic  . Appendectomy   . Carpal tunnel release   . Melanoma excision   . Replacement total knee   . Rectocele repair   . Vaginal hysterectomy   . Flexible sigmoidoscopy 07/09/2012    Procedure: FLEXIBLE SIGMOIDOSCOPY;  Surgeon: Inda Castle, MD;  Location: [facility];  Service: Endoscopy;  Laterality: N/A;    Family History  Problem Relation Age of  Onset  . Prostate cancer Brother   . Cancer Brother     renal cell carcinoma  . Heart disease Brother   . Cancer Mother     breast cancer  . Cancer Father     unknown kind    Allergies  Allergen Reactions  . Lisinopril     REACTION: angioedema  . Penicillins     REACTION: rash    Current Outpatient Prescriptions on File Prior to Visit  Medication Sig Dispense Refill  . acetaminophen (TYLENOL) 500 MG tablet Take 500 mg by mouth every 6 (six) hours as needed. For pain.      . Ascorbic Acid (VITAMIN C) 500 MG tablet Take 500 mg by mouth daily.        Marland Kitchen aspirin EC 81 MG tablet Take 81 mg by mouth daily.      . Calcium Carbonate-Vit D-Min (CALTRATE 600+D PLUS) 600-400 MG-UNIT per tablet Take 1 tablet by mouth daily.        . cycloSPORINE (RESTASIS) 0.05 % ophthalmic emulsion Place 1 drop into both eyes 2 (two) times daily.        . iron polysaccharides (NIFEREX) 150 MG capsule Take 150 mg by mouth daily.      Marland Kitchen losartan (COZAAR) 100 MG tablet TAKE 1 TABLET DAILY  90 tablet  1  . Multiple Vitamin (MULTIVITAMIN WITH MINERALS) TABS  Take 1 tablet by mouth daily.      Marland Kitchen SYNTHROID 88 MCG tablet TAKE 1 TABLET DAILY  90 tablet  3  . traMADol (ULTRAM) 50 MG tablet Take 50 mg by mouth 2 (two) times daily as needed. For pain.      . verapamil (CALAN-SR) 240 MG CR tablet Take 360 mg by mouth daily.        BP 90/60  Temp 98 F (36.7 C) (Oral)  Wt 129 lb (58.514 kg)      Review of Systems  HENT: Negative for hearing loss, congestion, sore throat, rhinorrhea, dental problem, sinus pressure and tinnitus.   Eyes: Negative for pain, discharge and visual disturbance.  Respiratory: Negative for cough and shortness of breath.   Cardiovascular: Negative for chest pain, palpitations and leg swelling.  Gastrointestinal: Negative for nausea, vomiting, abdominal pain, diarrhea, constipation, blood in stool and abdominal distention.  Genitourinary: Negative for dysuria, urgency, frequency,  hematuria, flank pain, vaginal bleeding, vaginal discharge, difficulty urinating, vaginal pain and pelvic pain.  Musculoskeletal: Negative for joint swelling, arthralgias and gait problem.  Skin: Negative for rash.  Neurological: Positive for weakness and light-headedness. Negative for dizziness, syncope, speech difficulty, numbness and headaches.  Hematological: Negative for adenopathy.  Psychiatric/Behavioral: Positive for confusion and decreased concentration. Negative for behavioral problems, dysphoric mood and agitation. The patient is not nervous/anxious.        Objective:   Physical Exam  Constitutional: She is oriented to person, place, and time. She appears well-developed and well-nourished.       Blood pressure 90/60  HENT:  Head: Normocephalic.  Right Ear: External ear normal.  Left Ear: External ear normal.  Mouth/Throat: Oropharynx is clear and moist.  Eyes: Conjunctivae and EOM are normal. Pupils are equal, round, and reactive to light.  Neck: Normal range of motion. Neck supple. No thyromegaly present.  Cardiovascular: Normal rate, regular rhythm, normal heart sounds and intact distal pulses.   Pulmonary/Chest: Effort normal and breath sounds normal.  Abdominal: Soft. Bowel sounds are normal. She exhibits no mass. There is no tenderness.  Musculoskeletal: Normal range of motion.  Lymphadenopathy:    She has no cervical adenopathy.  Neurological: She is alert and oriented to person, place, and time.  Skin: Skin is warm and dry. No rash noted.  Psychiatric: She has a normal mood and affect. Her behavior is normal.          Assessment & Plan:   Hypotension. We'll discontinue verapamil and continue with losartan Anemia and myelodysplastic syndrome. Per hematology Status post rectopexy. Clinically stable Reason confusion multifactorial

## 2012-08-18 DIAGNOSIS — Z48815 Encounter for surgical aftercare following surgery on the digestive system: Secondary | ICD-10-CM | POA: Diagnosis not present

## 2012-08-18 DIAGNOSIS — Q619 Cystic kidney disease, unspecified: Secondary | ICD-10-CM | POA: Diagnosis not present

## 2012-08-18 DIAGNOSIS — N289 Disorder of kidney and ureter, unspecified: Secondary | ICD-10-CM | POA: Diagnosis not present

## 2012-08-19 DIAGNOSIS — R269 Unspecified abnormalities of gait and mobility: Secondary | ICD-10-CM | POA: Diagnosis not present

## 2012-08-19 DIAGNOSIS — IMO0002 Reserved for concepts with insufficient information to code with codable children: Secondary | ICD-10-CM | POA: Diagnosis not present

## 2012-08-20 DIAGNOSIS — R269 Unspecified abnormalities of gait and mobility: Secondary | ICD-10-CM | POA: Diagnosis not present

## 2012-08-20 DIAGNOSIS — IMO0002 Reserved for concepts with insufficient information to code with codable children: Secondary | ICD-10-CM | POA: Diagnosis not present

## 2012-08-21 ENCOUNTER — Encounter: Payer: Self-pay | Admitting: *Deleted

## 2012-08-21 ENCOUNTER — Telehealth: Payer: Self-pay | Admitting: *Deleted

## 2012-08-21 DIAGNOSIS — IMO0002 Reserved for concepts with insufficient information to code with codable children: Secondary | ICD-10-CM | POA: Diagnosis not present

## 2012-08-21 DIAGNOSIS — R269 Unspecified abnormalities of gait and mobility: Secondary | ICD-10-CM | POA: Diagnosis not present

## 2012-08-21 NOTE — Telephone Encounter (Signed)
Called & spoke with pt's husband & informed to have pt increase niferex to 1 tab po bid.  Pt is back at her apartment now.  Husband expressed understanding.

## 2012-08-24 DIAGNOSIS — IMO0002 Reserved for concepts with insufficient information to code with codable children: Secondary | ICD-10-CM | POA: Diagnosis not present

## 2012-08-24 DIAGNOSIS — R269 Unspecified abnormalities of gait and mobility: Secondary | ICD-10-CM | POA: Diagnosis not present

## 2012-08-25 ENCOUNTER — Other Ambulatory Visit (HOSPITAL_BASED_OUTPATIENT_CLINIC_OR_DEPARTMENT_OTHER): Payer: Medicare Other | Admitting: Lab

## 2012-08-25 ENCOUNTER — Ambulatory Visit (HOSPITAL_BASED_OUTPATIENT_CLINIC_OR_DEPARTMENT_OTHER): Payer: Medicare Other

## 2012-08-25 VITALS — BP 113/62 | HR 93 | Temp 96.6°F

## 2012-08-25 DIAGNOSIS — D469 Myelodysplastic syndrome, unspecified: Secondary | ICD-10-CM

## 2012-08-25 DIAGNOSIS — D649 Anemia, unspecified: Secondary | ICD-10-CM | POA: Diagnosis not present

## 2012-08-25 DIAGNOSIS — D471 Chronic myeloproliferative disease: Secondary | ICD-10-CM

## 2012-08-25 DIAGNOSIS — R269 Unspecified abnormalities of gait and mobility: Secondary | ICD-10-CM | POA: Diagnosis not present

## 2012-08-25 DIAGNOSIS — IMO0002 Reserved for concepts with insufficient information to code with codable children: Secondary | ICD-10-CM | POA: Diagnosis not present

## 2012-08-25 DIAGNOSIS — D47Z9 Other specified neoplasms of uncertain behavior of lymphoid, hematopoietic and related tissue: Secondary | ICD-10-CM | POA: Diagnosis not present

## 2012-08-25 LAB — CBC WITH DIFFERENTIAL/PLATELET
BASO%: 1.1 % (ref 0.0–2.0)
LYMPH%: 22.9 % (ref 14.0–49.7)
MCHC: 33.3 g/dL (ref 31.5–36.0)
MCV: 88 fL (ref 79.5–101.0)
MONO%: 9.6 % (ref 0.0–14.0)
Platelets: 225 10*3/uL (ref 145–400)
RBC: 2.86 10*6/uL — ABNORMAL LOW (ref 3.70–5.45)
RDW: 18.2 % — ABNORMAL HIGH (ref 11.2–14.5)
WBC: 5.5 10*3/uL (ref 3.9–10.3)

## 2012-08-25 MED ORDER — DARBEPOETIN ALFA-POLYSORBATE 300 MCG/0.6ML IJ SOLN
300.0000 ug | Freq: Once | INTRAMUSCULAR | Status: AC
  Administered 2012-08-25: 300 ug via SUBCUTANEOUS
  Filled 2012-08-25: qty 0.6

## 2012-08-26 ENCOUNTER — Telehealth: Payer: Self-pay | Admitting: *Deleted

## 2012-08-26 DIAGNOSIS — R269 Unspecified abnormalities of gait and mobility: Secondary | ICD-10-CM | POA: Diagnosis not present

## 2012-08-26 DIAGNOSIS — IMO0002 Reserved for concepts with insufficient information to code with codable children: Secondary | ICD-10-CM | POA: Diagnosis not present

## 2012-08-26 NOTE — Telephone Encounter (Signed)
Message copied by Ignacia Felling on Wed Aug 26, 2012  4:28 PM ------      Message from: Brittany [name]      Created: Tue Aug 25, 2012  6:11 PM       Call pt  Hb low but stable at 8.4

## 2012-08-26 NOTE — Telephone Encounter (Signed)
Called patient and left message to call us.  Husband called back and let him know that Hgb was stable at 8.4

## 2012-08-27 DIAGNOSIS — R269 Unspecified abnormalities of gait and mobility: Secondary | ICD-10-CM | POA: Diagnosis not present

## 2012-08-27 DIAGNOSIS — IMO0002 Reserved for concepts with insufficient information to code with codable children: Secondary | ICD-10-CM | POA: Diagnosis not present

## 2012-08-28 DIAGNOSIS — N289 Disorder of kidney and ureter, unspecified: Secondary | ICD-10-CM | POA: Diagnosis not present

## 2012-08-28 DIAGNOSIS — N390 Urinary tract infection, site not specified: Secondary | ICD-10-CM | POA: Diagnosis not present

## 2012-09-01 DIAGNOSIS — IMO0002 Reserved for concepts with insufficient information to code with codable children: Secondary | ICD-10-CM | POA: Diagnosis not present

## 2012-09-01 DIAGNOSIS — R269 Unspecified abnormalities of gait and mobility: Secondary | ICD-10-CM | POA: Diagnosis not present

## 2012-09-02 DIAGNOSIS — R269 Unspecified abnormalities of gait and mobility: Secondary | ICD-10-CM | POA: Diagnosis not present

## 2012-09-02 DIAGNOSIS — IMO0002 Reserved for concepts with insufficient information to code with codable children: Secondary | ICD-10-CM | POA: Diagnosis not present

## 2012-09-03 DIAGNOSIS — IMO0002 Reserved for concepts with insufficient information to code with codable children: Secondary | ICD-10-CM | POA: Diagnosis not present

## 2012-09-03 DIAGNOSIS — R269 Unspecified abnormalities of gait and mobility: Secondary | ICD-10-CM | POA: Diagnosis not present

## 2012-09-04 DIAGNOSIS — R269 Unspecified abnormalities of gait and mobility: Secondary | ICD-10-CM | POA: Diagnosis not present

## 2012-09-04 DIAGNOSIS — IMO0002 Reserved for concepts with insufficient information to code with codable children: Secondary | ICD-10-CM | POA: Diagnosis not present

## 2012-09-14 ENCOUNTER — Ambulatory Visit (INDEPENDENT_AMBULATORY_CARE_PROVIDER_SITE_OTHER): Payer: Medicare Other | Admitting: Internal Medicine

## 2012-09-14 ENCOUNTER — Encounter: Payer: Self-pay | Admitting: Internal Medicine

## 2012-09-14 VITALS — BP 110/68 | Temp 98.1°F | Wt 130.0 lb

## 2012-09-14 DIAGNOSIS — Z23 Encounter for immunization: Secondary | ICD-10-CM | POA: Diagnosis not present

## 2012-09-14 DIAGNOSIS — E119 Type 2 diabetes mellitus without complications: Secondary | ICD-10-CM | POA: Diagnosis not present

## 2012-09-14 DIAGNOSIS — I10 Essential (primary) hypertension: Secondary | ICD-10-CM

## 2012-09-14 DIAGNOSIS — K5900 Constipation, unspecified: Secondary | ICD-10-CM

## 2012-09-14 DIAGNOSIS — D469 Myelodysplastic syndrome, unspecified: Secondary | ICD-10-CM

## 2012-09-14 NOTE — Patient Instructions (Signed)
May dispense all medications in the morning Give an  additional iron tablet in the afternoon Give calcium tablet once daily at bedtime  Limit your sodium (Salt) intake  Return in 3 months for follow-up

## 2012-09-14 NOTE — Progress Notes (Signed)
Subjective:    Patient ID: Brittany [name], female    DOB: [birth year], 78 y.o.   MRN: [id number]  HPI  78 year old patient who is seen today for followup. She was seen here one month ago with some GI issues. Verapamil was discontinued and MiraLax was changed to a when necessary regimen. She was having issues with constipation as well as overtreatment with episodes of incontinence. She has done much better and basically is using MiraLax every other day with reasonably good bowel habits and only rare episodes of incontinence She has a history of anemia and myeloproliferative disorder. She has treated hypertension which has done well since discontinuation of verapamil. She has hypothyroidism. Her medical regimen reviewed and discussed at length and administration simplified  Past Medical History  Diagnosis Date  . Melanoma     x2  . Diabetes mellitus   . Diverticulosis of colon   . Hyperlipidemia   . Hypothyroidism   . Osteoporosis   . Blood transfusion 2006  . Myeloproliferative disorder 02/04/2012  . Normochromic normocytic anemia 02/04/2012    History   Social History  . Marital Status: Married    Spouse Name: N/A    Number of Children: N/A  . Years of Education: N/A   Occupational History  . Not on file.   Social History Main Topics  . Smoking status: Never Smoker   . Smokeless tobacco: Never Used  . Alcohol Use: No  . Drug Use: No  . Sexually Active: Not on file   Other Topics Concern  . Not on file   Social History Narrative  . No narrative on file    Past Surgical History  Procedure Date  . Polypectomy     Laparoscopic  . Appendectomy   . Carpal tunnel release   . Melanoma excision   . Replacement total knee   . Rectocele repair   . Vaginal hysterectomy   . Flexible sigmoidoscopy 07/09/2012    Procedure: FLEXIBLE SIGMOIDOSCOPY;  Surgeon: Inda Castle, MD;  Location: [facility];  Service: Endoscopy;  Laterality: N/A;    Family History  Problem Relation  Age of Onset  . Prostate cancer Brother   . Cancer Brother     renal cell carcinoma  . Heart disease Brother   . Cancer Mother     breast cancer  . Cancer Father     unknown kind    Allergies  Allergen Reactions  . Lisinopril     REACTION: angioedema  . Penicillins     REACTION: rash    Current Outpatient Prescriptions on File Prior to Visit  Medication Sig Dispense Refill  . acetaminophen (TYLENOL) 500 MG tablet Take 500 mg by mouth every 6 (six) hours as needed. For pain.      . Ascorbic Acid (VITAMIN C) 500 MG tablet Take 500 mg by mouth daily.        Marland Kitchen aspirin EC 81 MG tablet Take 81 mg by mouth daily.      . Calcium Carbonate-Vit D-Min (CALTRATE 600+D PLUS) 600-400 MG-UNIT per tablet Take 1 tablet by mouth daily.        . Cholecalciferol (VITAMIN D-3 PO) Take by mouth daily.      . cycloSPORINE (RESTASIS) 0.05 % ophthalmic emulsion Place 1 drop into both eyes 2 (two) times daily.        . iron polysaccharides (NIFEREX) 150 MG capsule Take 150 mg by mouth 2 (two) times daily.       Marland Kitchen losartan (COZAAR)  100 MG tablet TAKE 1 TABLET DAILY  90 tablet  1  . Multiple Vitamin (MULTIVITAMIN WITH MINERALS) TABS Take 1 tablet by mouth daily.      . polyethylene glycol powder (GLYCOLAX/MIRALAX) powder Take 17 g by mouth daily as needed.      Marland Kitchen SYNTHROID 88 MCG tablet TAKE 1 TABLET DAILY  90 tablet  3  . traMADol (ULTRAM) 50 MG tablet Take 50 mg by mouth 2 (two) times daily as needed. For pain.        BP 110/68  Temp 98.1 F (36.7 C) (Oral)  Wt 130 lb (58.968 kg)       Review of Systems  Constitutional: Negative.   HENT: Negative for hearing loss, congestion, sore throat, rhinorrhea, dental problem, sinus pressure and tinnitus.   Eyes: Negative for pain, discharge and visual disturbance.  Respiratory: Negative for cough and shortness of breath.   Cardiovascular: Negative for chest pain, palpitations and leg swelling.  Gastrointestinal: Positive for constipation. Negative for  nausea, vomiting, abdominal pain, diarrhea, blood in stool and abdominal distention.  Genitourinary: Negative for dysuria, urgency, frequency, hematuria, flank pain, vaginal bleeding, vaginal discharge, difficulty urinating, vaginal pain and pelvic pain.  Musculoskeletal: Negative for joint swelling, arthralgias and gait problem.  Skin: Negative for rash.  Neurological: Negative for dizziness, syncope, speech difficulty, weakness, numbness and headaches.  Hematological: Negative for adenopathy.  Psychiatric/Behavioral: Negative for behavioral problems, dysphoric mood and agitation. The patient is not nervous/anxious.        Objective:   Physical Exam  Constitutional: She is oriented to person, place, and time. She appears well-developed and well-nourished.  HENT:  Head: Normocephalic.  Right Ear: External ear normal.  Left Ear: External ear normal.  Mouth/Throat: Oropharynx is clear and moist.  Eyes: Conjunctivae normal and EOM are normal. Pupils are equal, round, and reactive to light.  Neck: Normal range of motion. Neck supple. No thyromegaly present.  Cardiovascular: Normal rate, regular rhythm, normal heart sounds and intact distal pulses.   Pulmonary/Chest: Effort normal and breath sounds normal.  Abdominal: Soft. Bowel sounds are normal. She exhibits no mass. There is no tenderness.  Musculoskeletal: Normal range of motion.  Lymphadenopathy:    She has no cervical adenopathy.  Neurological: She is alert and oriented to person, place, and time.  Skin: Skin is warm and dry. No rash noted.  Psychiatric: She has a normal mood and affect. Her behavior is normal.          Assessment & Plan:   Constipation. Improved since discontinuation of verapamil  Hypertension well controlled on losartan only will continue same blood pressure well controlled today Hypothyroidism Myelodysplastic syndrome hypothyroidism  Patient is clinically stable We'll recheck in 3 months Hemoglobin  A1c checked at that time

## 2012-09-18 ENCOUNTER — Telehealth: Payer: Self-pay | Admitting: Oncology

## 2012-09-18 NOTE — Telephone Encounter (Signed)
[name] husband called moved appt to earlier on 09/21/12, lab and injection

## 2012-09-22 ENCOUNTER — Other Ambulatory Visit (HOSPITAL_BASED_OUTPATIENT_CLINIC_OR_DEPARTMENT_OTHER): Payer: Medicare Other | Admitting: Lab

## 2012-09-22 ENCOUNTER — Other Ambulatory Visit: Payer: Medicare Other | Admitting: Lab

## 2012-09-22 ENCOUNTER — Ambulatory Visit: Payer: Medicare Other

## 2012-09-22 ENCOUNTER — Ambulatory Visit (HOSPITAL_BASED_OUTPATIENT_CLINIC_OR_DEPARTMENT_OTHER): Payer: Medicare Other

## 2012-09-22 VITALS — BP 113/68 | HR 91 | Temp 97.4°F

## 2012-09-22 DIAGNOSIS — D47Z9 Other specified neoplasms of uncertain behavior of lymphoid, hematopoietic and related tissue: Secondary | ICD-10-CM | POA: Diagnosis not present

## 2012-09-22 DIAGNOSIS — D469 Myelodysplastic syndrome, unspecified: Secondary | ICD-10-CM

## 2012-09-22 DIAGNOSIS — D471 Chronic myeloproliferative disease: Secondary | ICD-10-CM

## 2012-09-22 LAB — CBC WITH DIFFERENTIAL/PLATELET
BASO%: 0.8 % (ref 0.0–2.0)
MCHC: 34.3 g/dL (ref 31.5–36.0)
MONO#: 0.5 10*3/uL (ref 0.1–0.9)
NEUT#: 2.9 10*3/uL (ref 1.5–6.5)
RBC: 3.11 10*6/uL — ABNORMAL LOW (ref 3.70–5.45)
RDW: 18 % — ABNORMAL HIGH (ref 11.2–14.5)
WBC: 5.3 10*3/uL (ref 3.9–10.3)
lymph#: 1.7 10*3/uL (ref 0.9–3.3)

## 2012-09-22 MED ORDER — DARBEPOETIN ALFA-POLYSORBATE 300 MCG/0.6ML IJ SOLN
300.0000 ug | Freq: Once | INTRAMUSCULAR | Status: AC
  Administered 2012-09-22: 300 ug via SUBCUTANEOUS
  Filled 2012-09-22: qty 0.6

## 2012-10-13 DIAGNOSIS — I6350 Cerebral infarction due to unspecified occlusion or stenosis of unspecified cerebral artery: Secondary | ICD-10-CM | POA: Diagnosis not present

## 2012-10-20 ENCOUNTER — Telehealth: Payer: Self-pay | Admitting: Oncology

## 2012-10-20 ENCOUNTER — Ambulatory Visit (HOSPITAL_BASED_OUTPATIENT_CLINIC_OR_DEPARTMENT_OTHER): Payer: Medicare Other | Admitting: Oncology

## 2012-10-20 ENCOUNTER — Other Ambulatory Visit (HOSPITAL_BASED_OUTPATIENT_CLINIC_OR_DEPARTMENT_OTHER): Payer: Medicare Other | Admitting: Lab

## 2012-10-20 ENCOUNTER — Ambulatory Visit: Payer: Medicare Other

## 2012-10-20 VITALS — BP 137/65 | HR 84 | Temp 98.4°F | Resp 18 | Ht 60.0 in | Wt 131.5 lb

## 2012-10-20 DIAGNOSIS — D649 Anemia, unspecified: Secondary | ICD-10-CM

## 2012-10-20 DIAGNOSIS — D469 Myelodysplastic syndrome, unspecified: Secondary | ICD-10-CM

## 2012-10-20 DIAGNOSIS — D471 Chronic myeloproliferative disease: Secondary | ICD-10-CM

## 2012-10-20 LAB — CBC WITH DIFFERENTIAL/PLATELET
BASO%: 1.4 % (ref 0.0–2.0)
Basophils Absolute: 0.1 10*3/uL (ref 0.0–0.1)
Eosinophils Absolute: 0.1 10*3/uL (ref 0.0–0.5)
HCT: 31.7 % — ABNORMAL LOW (ref 34.8–46.6)
HGB: 10.7 g/dL — ABNORMAL LOW (ref 11.6–15.9)
MCHC: 33.8 g/dL (ref 31.5–36.0)
MONO#: 0.6 10*3/uL (ref 0.1–0.9)
NEUT#: 4.4 10*3/uL (ref 1.5–6.5)
NEUT%: 61.2 % (ref 38.4–76.8)
WBC: 7.1 10*3/uL (ref 3.9–10.3)
lymph#: 2 10*3/uL (ref 0.9–3.3)

## 2012-10-20 NOTE — Telephone Encounter (Signed)
appts made and printed for pt aom °

## 2012-10-20 NOTE — Progress Notes (Signed)
Hematology and Oncology Follow Up Visit  Brittany [name] [id number] October 26, 1924 78 y.o. 10/20/2012 2:58 PM   Principle Diagnosis: Encounter Diagnoses  Name Primary?  Marland Kitchen MYELODYSPLASTIC SYNDROME Yes  . Anemia   . Myeloproliferative disorder      Interim History:   Followup visit for this pleasant 78 year old woman with a myeloproliferative disorder with dysplastic features. She was diagnosed in May of 2008 when she presented with unexplained normochromic anemia. Bone marrow aspiration and biopsy was done on 04/23/2007. Marrow was hypercellular for age 73-80%  Maturation was normal in all 3 cell lines and there were only 1% blasts. Cytogenetics were normal including FISH studies to look at chromosome 5 and chromosome 7 deletions which were not present. Hemoglobin was 9.7 at the time with MCV 90, [place] count 4700, and platelets 162,000. There were no ringed sideroblasts.  She was initially followed with observation alone until hemoglobin fell to 8.6 by June of 2010.  She was started on a trial of Aranesp and had a nice response with rise in hemoglobin up to 12.5 g. She is currently on a dose of 300 mcg every 4 weeks and we have been able to maintain her hemoglobin at 10-11 g.  Since last visit here, she was admitted to [facility] in [place] and underwent surgery for a rectal prolapse. Husband states that she was confused probably as a result of being under anesthesia for a number of hours and secondary to a fall in her hemoglobin. She received a blood transfusion. Mental status returned to her baseline. No other interim problems.     Medications: reviewed  Allergies:  Allergies  Allergen Reactions  . Lisinopril     REACTION: angioedema  . Penicillins     REACTION: rash    Review of Systems: Constitutional:   No constitutional symptoms Respiratory: No cough or dyspnea Cardiovascular:  Denies chest pain or palpitation Gastrointestinal: She was constipated and took laxatives  then got diarrhea. No hematochezia or melena Genito-Urinary: No urinary tract symptoms Musculoskeletal: Chronic arthritis pain Neurologic: See above Skin: No rash Remaining ROS negative.  Physical Exam: Blood pressure 137/65, pulse 84, temperature 98.4 F (36.9 C), temperature source Oral, resp. rate 18, height 5' (1.524 m), weight 131 lb 8 oz (59.648 kg). Wt Readings from Last 3 Encounters:  10/20/12 131 lb 8 oz (59.648 kg)  09/14/12 130 lb (58.968 kg)  08/14/12 129 lb (58.514 kg)     General appearance: Well-nourished Caucasian woman HENNT: Pharynx no erythema or exudate Lymph nodes: No cervical, supraclavicular, or axillary adenopathy Breasts: Surgical changes, no dominant mass Lungs: Clear to auscultation resonant to percussion Heart: Regular rhythm no murmur Abdomen: Soft, nontender, no mass, no organomegaly Extremities: No edema, no calf tenderness Vascular: No cyanosis Neurologic: She is hard of hearing, cranial nerves otherwise grossly normal, she is alert and oriented x3, motor strength is 5 over 5, reflexes 1+ symmetric Skin: Small ecchymoses dorsum of her hands  Lab Results: Lab Results  Component Value Date   WBC 7.1 10/20/2012   HGB 10.7* 10/20/2012   HCT 31.7* 10/20/2012   MCV 87.8 10/20/2012   PLT 200 10/20/2012     Chemistry      Component Value Date/Time   NA 138 07/09/2012 0355   K 4.0 07/09/2012 0355   CL 102 07/09/2012 0355   CO2 22 07/09/2012 0355   BUN 31* 07/09/2012 0355   CREATININE 1.09 07/09/2012 0355      Component Value Date/Time   CALCIUM 9.2 07/09/2012 0355  ALKPHOS 70 02/04/2012 1042   AST 19 02/04/2012 1042   ALT 24 02/04/2012 1042   BILITOT 0.6 02/04/2012 1042       Radiological Studies: Most recent mammogram done in February 2009   Impression and Plan: #1. Myeloproliferative disorder with dysplastic features. Are all stable with Aranesp support. Plan: Continue Q4 weekly Aranesp for hemoglobin less than or equal to 10 g  #2.  Hypothyroid on replacement  #3. Recent surgery for rectal prolapse  Husband wanted to know what the prognosis of her blood disorder was. Although this is somewhat unpredictable, she appears to be in a low risk situation with respect to transformation to acute leukemia. I would estimate this at being less than 5% at 5 years. She has isolated anemia. No excess blasts or chromosome abnormalities in the previous bone marrow biopsy and this predicts low risk for leukemic transformation. I told them that if she did develop acute leukemia, at her age, there is no effective treatment that is not prohibitively toxic and that if that occasion comes up we would treat conservatively.   CC:. Dr. Micael Hampshire, MD 11/5/20132:58 PM

## 2012-10-28 DIAGNOSIS — I6350 Cerebral infarction due to unspecified occlusion or stenosis of unspecified cerebral artery: Secondary | ICD-10-CM | POA: Diagnosis not present

## 2012-11-10 DIAGNOSIS — R413 Other amnesia: Secondary | ICD-10-CM | POA: Diagnosis not present

## 2012-11-11 ENCOUNTER — Ambulatory Visit (INDEPENDENT_AMBULATORY_CARE_PROVIDER_SITE_OTHER): Payer: Medicare Other | Admitting: Internal Medicine

## 2012-11-11 ENCOUNTER — Encounter: Payer: Self-pay | Admitting: Internal Medicine

## 2012-11-11 ENCOUNTER — Telehealth: Payer: Self-pay | Admitting: Oncology

## 2012-11-11 ENCOUNTER — Telehealth: Payer: Self-pay | Admitting: *Deleted

## 2012-11-11 VITALS — BP 100/58 | HR 76 | Temp 97.8°F | Resp 20 | Wt 133.0 lb

## 2012-11-11 DIAGNOSIS — I10 Essential (primary) hypertension: Secondary | ICD-10-CM | POA: Diagnosis not present

## 2012-11-11 DIAGNOSIS — F0150 Vascular dementia without behavioral disturbance: Secondary | ICD-10-CM | POA: Insufficient documentation

## 2012-11-11 DIAGNOSIS — E119 Type 2 diabetes mellitus without complications: Secondary | ICD-10-CM

## 2012-11-11 DIAGNOSIS — D469 Myelodysplastic syndrome, unspecified: Secondary | ICD-10-CM

## 2012-11-11 DIAGNOSIS — F0390 Unspecified dementia without behavioral disturbance: Secondary | ICD-10-CM

## 2012-11-11 DIAGNOSIS — D649 Anemia, unspecified: Secondary | ICD-10-CM | POA: Diagnosis not present

## 2012-11-11 MED ORDER — DONEPEZIL HCL 5 MG PO TABS: ORAL_TABLET | ORAL | Status: DC

## 2012-11-11 NOTE — Telephone Encounter (Signed)
[name] dtr in law who identified herself also as a doctor. lmonvm yesterday wanting to know if patient could see JG this wk or even Monday to talk about her situation. Above message given to desk nurse.

## 2012-11-11 NOTE — Progress Notes (Signed)
Subjective:    Patient ID: Brittany [name], female    DOB: 15-May-1924, 78 y.o.   MRN: [id number]  HPI  78 year old patient who is seen today in followup. Medical problems include hypertension. Since her last visit here she has been evaluated at [facility] by neurology due to forgetfulness. According to family (daughter is M.D. from [employer]) MRI was obtained it revealed evidence of the multi-infarct dementia. Aricept was prescribed. The daughter is also concerned about anemia and overtreatment of hypertension as factors for recurrent small vessel strokes. The patient presently is on losartan only for blood pressure control recent hemoglobin was near normal. The patient does complain of some mild forgetfulness but in general does quite well. Daughter states that blood pressure yesterday at neurology was in the range of 160/64. On arrival today blood pressure 100/58. Blood pressure my exam 130/70  Past Medical History  Diagnosis Date  . Melanoma     x2  . Diabetes mellitus   . Diverticulosis of colon   . Hyperlipidemia   . Hypothyroidism   . Osteoporosis   . Blood transfusion 2006  . Myeloproliferative disorder 02/04/2012  . Normochromic normocytic anemia 02/04/2012    History   Social History  . Marital Status: Married    Spouse Name: N/A    Number of Children: N/A  . Years of Education: N/A   Occupational History  . Not on file.   Social History Main Topics  . Smoking status: Never Smoker   . Smokeless tobacco: Never Used  . Alcohol Use: No  . Drug Use: No  . Sexually Active: Not on file   Other Topics Concern  . Not on file   Social History Narrative  . No narrative on file    Past Surgical History  Procedure Date  . Polypectomy     Laparoscopic  . Appendectomy   . Carpal tunnel release   . Melanoma excision   . Replacement total knee   . Rectocele repair   . Vaginal hysterectomy   . Flexible sigmoidoscopy 07/09/2012    Procedure: FLEXIBLE  SIGMOIDOSCOPY;  Surgeon: Inda Castle, MD;  Location: [facility];  Service: Endoscopy;  Laterality: N/A;    Family History  Problem Relation Age of Onset  . Prostate cancer Brother   . Cancer Brother     renal cell carcinoma  . Heart disease Brother   . Cancer Mother     breast cancer  . Cancer Father     unknown kind    Allergies  Allergen Reactions  . Lisinopril     REACTION: angioedema  . Penicillins     REACTION: rash    Current Outpatient Prescriptions on File Prior to Visit  Medication Sig Dispense Refill  . acetaminophen (TYLENOL) 500 MG tablet Take 500 mg by mouth every 6 (six) hours as needed. For pain.      . Ascorbic Acid (VITAMIN C) 500 MG tablet Take 500 mg by mouth daily.        Marland Kitchen aspirin EC 81 MG tablet Take 81 mg by mouth daily.      . Calcium Carbonate-Vit D-Min (CALTRATE 600+D PLUS) 600-400 MG-UNIT per tablet Take 1 tablet by mouth daily.        . Cholecalciferol (VITAMIN D-3 PO) Take by mouth daily.      . cycloSPORINE (RESTASIS) 0.05 % ophthalmic emulsion Place 1 drop into both eyes 2 (two) times daily.        . iron polysaccharides (NIFEREX) 150  MG capsule Take 150 mg by mouth 2 (two) times daily.       Marland Kitchen losartan (COZAAR) 100 MG tablet TAKE 1 TABLET DAILY  90 tablet  1  . Multiple Vitamin (MULTIVITAMIN WITH MINERALS) TABS Take 1 tablet by mouth daily.      . polyethylene glycol powder (GLYCOLAX/MIRALAX) powder Take 17 g by mouth daily as needed.      Marland Kitchen SYNTHROID 88 MCG tablet TAKE 1 TABLET DAILY  90 tablet  3  . traMADol (ULTRAM) 50 MG tablet Take 50 mg by mouth 2 (two) times daily as needed. For pain.      Marland Kitchen donepezil (ARICEPT) 5 MG tablet 1 tablet daily        BP 100/58  Pulse 76  Temp 97.8 F (36.6 C) (Oral)  Resp 20  Wt 133 lb (60.328 kg)  SpO2 97%      Review of Systems  Constitutional: Negative.   HENT: Negative for hearing loss, congestion, sore throat, rhinorrhea, dental problem, sinus pressure and tinnitus.   Eyes: Negative  for pain, discharge and visual disturbance.  Respiratory: Negative for cough and shortness of breath.   Cardiovascular: Negative for chest pain, palpitations and leg swelling.  Gastrointestinal: Negative for nausea, vomiting, abdominal pain, diarrhea, constipation, blood in stool and abdominal distention.  Genitourinary: Negative for dysuria, urgency, frequency, hematuria, flank pain, vaginal bleeding, vaginal discharge, difficulty urinating, vaginal pain and pelvic pain.  Musculoskeletal: Negative for joint swelling, arthralgias and gait problem.  Skin: Negative for rash.  Neurological: Negative for dizziness, syncope, speech difficulty, weakness, numbness and headaches.  Hematological: Negative for adenopathy.  Psychiatric/Behavioral: Positive for confusion. Negative for behavioral problems, dysphoric mood and agitation. The patient is not nervous/anxious.        Objective:   Physical Exam  Constitutional: She is oriented to person, place, and time. She appears well-developed and well-nourished. No distress.  HENT:  Head: Normocephalic.  Right Ear: External ear normal.  Left Ear: External ear normal.  Mouth/Throat: Oropharynx is clear and moist.  Eyes: Conjunctivae normal and EOM are normal. Pupils are equal, round, and reactive to light.  Neck: Normal range of motion. Neck supple. No thyromegaly present.  Cardiovascular: Normal rate, regular rhythm, normal heart sounds and intact distal pulses.   Pulmonary/Chest: Effort normal and breath sounds normal.  Abdominal: Soft. Bowel sounds are normal. She exhibits no mass. There is no tenderness.  Musculoskeletal: Normal range of motion.  Lymphadenopathy:    She has no cervical adenopathy.  Neurological: She is alert and oriented to person, place, and time.       Able to do serial sevens and spell world backwards Only able to recall 2 of 3 objects 5 minutes later Able to name the month but not the date  Skin: Skin is warm and dry. No  rash noted.  Psychiatric: She has a normal mood and affect. Her behavior is normal.          Assessment & Plan:   Dementia- mild ; aricept ordered HTN stable; will track home bp readings and reassess in 4 wks Anemia- stable will check in 4 wks

## 2012-11-11 NOTE — Patient Instructions (Signed)
Limit your sodium (Salt) intake  Please check your blood pressure on a regular basis.  If it is consistently greater than 150/90, please make an office appointment.  Return in one month for follow-up  

## 2012-11-11 NOTE — Telephone Encounter (Signed)
Received call from Melissa/Scheduler reporting that pt's daughter in law called yest asking for an appt while she is in town to see Dr Beryle Beams, either this week or mon to discuss her mother-in-law's situation.   Attempted to call pt's ph # & line busy.@ 10:30 am. Attempted to call pt's home # @ 14:13 & 1650 & line busy .

## 2012-11-13 ENCOUNTER — Telehealth: Payer: Self-pay | Admitting: *Deleted

## 2012-11-13 NOTE — Telephone Encounter (Signed)
Another attempt made today to reach pt.  Phone line continues to be busy.

## 2012-11-17 ENCOUNTER — Other Ambulatory Visit (HOSPITAL_BASED_OUTPATIENT_CLINIC_OR_DEPARTMENT_OTHER): Payer: Medicare Other | Admitting: Lab

## 2012-11-17 ENCOUNTER — Ambulatory Visit: Payer: Medicare Other

## 2012-11-17 DIAGNOSIS — D469 Myelodysplastic syndrome, unspecified: Secondary | ICD-10-CM | POA: Diagnosis not present

## 2012-11-17 DIAGNOSIS — D471 Chronic myeloproliferative disease: Secondary | ICD-10-CM

## 2012-11-17 LAB — CBC WITH DIFFERENTIAL/PLATELET
Basophils Absolute: 0.1 10*3/uL (ref 0.0–0.1)
EOS%: 1.5 % (ref 0.0–7.0)
HCT: 30.5 % — ABNORMAL LOW (ref 34.8–46.6)
HGB: 10.4 g/dL — ABNORMAL LOW (ref 11.6–15.9)
MCH: 29.3 pg (ref 25.1–34.0)
NEUT%: 58.1 % (ref 38.4–76.8)
lymph#: 2.3 10*3/uL (ref 0.9–3.3)

## 2012-11-17 LAB — COMPREHENSIVE METABOLIC PANEL (CC13)
BUN: 28 mg/dL — ABNORMAL HIGH (ref 7.0–26.0)
CO2: 25 mEq/L (ref 22–29)
Calcium: 9.8 mg/dL (ref 8.4–10.4)
Chloride: 106 mEq/L (ref 98–107)
Creatinine: 1 mg/dL (ref 0.6–1.1)

## 2012-11-17 MED ORDER — DARBEPOETIN ALFA-POLYSORBATE 300 MCG/0.6ML IJ SOLN
300.0000 ug | Freq: Once | INTRAMUSCULAR | Status: DC
  Filled 2012-11-17: qty 0.6

## 2012-11-20 ENCOUNTER — Ambulatory Visit: Payer: Medicare Other | Admitting: Internal Medicine

## 2012-11-20 ENCOUNTER — Telehealth: Payer: Self-pay | Admitting: Internal Medicine

## 2012-11-20 NOTE — Telephone Encounter (Signed)
Pts daghter-in-law called and said that she sent a letter to Dr Burnice Logan re: pt having a kidney tumor. Letter should have been rcvd today or yesterday. Pls call pts daugheter-in-law to discuss.

## 2012-12-14 ENCOUNTER — Ambulatory Visit (INDEPENDENT_AMBULATORY_CARE_PROVIDER_SITE_OTHER): Payer: Medicare Other | Admitting: Internal Medicine

## 2012-12-14 ENCOUNTER — Encounter: Payer: Self-pay | Admitting: Internal Medicine

## 2012-12-14 VITALS — BP 100/60 | HR 98 | Temp 97.5°F | Resp 18 | Wt 137.0 lb

## 2012-12-14 DIAGNOSIS — F0390 Unspecified dementia without behavioral disturbance: Secondary | ICD-10-CM | POA: Diagnosis not present

## 2012-12-14 DIAGNOSIS — D469 Myelodysplastic syndrome, unspecified: Secondary | ICD-10-CM | POA: Diagnosis not present

## 2012-12-14 DIAGNOSIS — I10 Essential (primary) hypertension: Secondary | ICD-10-CM | POA: Diagnosis not present

## 2012-12-14 DIAGNOSIS — E039 Hypothyroidism, unspecified: Secondary | ICD-10-CM

## 2012-12-14 MED ORDER — MEMANTINE HCL ER 28 MG PO CP24: 1.0000 | ORAL_CAPSULE | Freq: Every day | ORAL | Status: DC

## 2012-12-14 NOTE — Progress Notes (Signed)
Subjective:    Patient ID: Brittany [name], female    DOB: [birth year], 78 y.o.   MRN: [id number]  HPI  78 year old patient who is seen today for followup. She is followed by [facility] urology did to a suspected renal cysts. There is some concern about a complex cyst and neoplasm cannot be excluded. He has been decided to followup with a CT in 6 months. She has treated hypertension which remains in a low-normal range. She has dementia and has been followed by [facility] neurology and now is on Namenda.  Past Medical History  Diagnosis Date  . Melanoma     x2  . Diabetes mellitus   . Diverticulosis of colon   . Hyperlipidemia   . Hypothyroidism   . Osteoporosis   . Blood transfusion 2006  . Myeloproliferative disorder 02/04/2012  . Normochromic normocytic anemia 02/04/2012    History   Social History  . Marital Status: Married    Spouse Name: N/A    Number of Children: N/A  . Years of Education: N/A   Occupational History  . Not on file.   Social History Main Topics  . Smoking status: Never Smoker   . Smokeless tobacco: Never Used  . Alcohol Use: No  . Drug Use: No  . Sexually Active: Not on file   Other Topics Concern  . Not on file   Social History Narrative  . No narrative on file    Past Surgical History  Procedure Date  . Polypectomy     Laparoscopic  . Appendectomy   . Carpal tunnel release   . Melanoma excision   . Replacement total knee   . Rectocele repair   . Vaginal hysterectomy   . Flexible sigmoidoscopy 07/09/2012    Procedure: FLEXIBLE SIGMOIDOSCOPY;  Surgeon: Inda Castle, MD;  Location: [facility] ENDOSCOPY;  Service: Endoscopy;  Laterality: N/A;    Family History  Problem Relation Age of Onset  . Prostate cancer Brother   . Cancer Brother     renal cell carcinoma  . Heart disease Brother   . Cancer Mother     breast cancer  . Cancer Father     unknown kind    Allergies  Allergen Reactions  . Lisinopril     REACTION: angioedema  . Penicillins      REACTION: rash    Current Outpatient Prescriptions on File Prior to Visit  Medication Sig Dispense Refill  . acetaminophen (TYLENOL) 500 MG tablet Take 500 mg by mouth every 6 (six) hours as needed. For pain.      . Ascorbic Acid (VITAMIN C) 500 MG tablet Take 500 mg by mouth daily.        Marland Kitchen aspirin EC 81 MG tablet Take 81 mg by mouth daily.      . Calcium Carbonate-Vit D-Min (CALTRATE 600+D PLUS) 600-400 MG-UNIT per tablet Take 1 tablet by mouth daily.        . Cholecalciferol (VITAMIN D-3 PO) Take by mouth daily.      . cycloSPORINE (RESTASIS) 0.05 % ophthalmic emulsion Place 1 drop into both eyes 2 (two) times daily.        Marland Kitchen donepezil (ARICEPT) 5 MG tablet 1 tablet daily      . iron polysaccharides (NIFEREX) 150 MG capsule Take 150 mg by mouth 2 (two) times daily.       Marland Kitchen losartan (COZAAR) 100 MG tablet TAKE 1 TABLET DAILY  90 tablet  1  . Multiple Vitamin (MULTIVITAMIN WITH MINERALS) TABS  Take 1 tablet by mouth daily.      Marland Kitchen SYNTHROID 88 MCG tablet TAKE 1 TABLET DAILY  90 tablet  3  . traMADol (ULTRAM) 50 MG tablet Take 50 mg by mouth 2 (two) times daily as needed. For pain.      . polyethylene glycol powder (GLYCOLAX/MIRALAX) powder Take 17 g by mouth daily as needed.        BP 100/60  Pulse 98  Temp 97.5 F (36.4 C) (Oral)  Resp 18  Wt 137 lb (62.143 kg)  SpO2 95%       Review of Systems  Constitutional: Negative.   HENT: Negative for hearing loss, congestion, sore throat, rhinorrhea, dental problem, sinus pressure and tinnitus.   Eyes: Negative for pain, discharge and visual disturbance.  Respiratory: Negative for cough and shortness of breath.   Cardiovascular: Negative for chest pain, palpitations and leg swelling.  Gastrointestinal: Negative for nausea, vomiting, abdominal pain, diarrhea, constipation, blood in stool and abdominal distention.  Genitourinary: Negative for dysuria, urgency, frequency, hematuria, flank pain, vaginal bleeding, vaginal discharge,  difficulty urinating, vaginal pain and pelvic pain.  Musculoskeletal: Negative for joint swelling, arthralgias and gait problem.  Skin: Negative for rash.  Neurological: Negative for dizziness, syncope, speech difficulty, weakness, numbness and headaches.  Hematological: Negative for adenopathy.  Psychiatric/Behavioral: Positive for confusion. Negative for behavioral problems, dysphoric mood and agitation. The patient is not nervous/anxious.        Objective:   Physical Exam  Constitutional: She is oriented to person, place, and time. She appears well-developed and well-nourished.  HENT:  Head: Normocephalic.  Right Ear: External ear normal.  Left Ear: External ear normal.  Mouth/Throat: Oropharynx is clear and moist.  Eyes: Conjunctivae normal and EOM are normal. Pupils are equal, round, and reactive to light.  Neck: Normal range of motion. Neck supple. No thyromegaly present.  Cardiovascular: Normal rate, regular rhythm, normal heart sounds and intact distal pulses.   Pulmonary/Chest: Effort normal and breath sounds normal.  Abdominal: Soft. Bowel sounds are normal. She exhibits no mass. There is no tenderness.  Musculoskeletal: Normal range of motion.  Lymphadenopathy:    She has no cervical adenopathy.  Neurological: She is alert and oriented to person, place, and time.  Skin: Skin is warm and dry. No rash noted.  Psychiatric: She has a normal mood and affect. Her behavior is normal.          Assessment & Plan:   Hypertension stable Dementia  Recheck 6 months

## 2012-12-14 NOTE — Patient Instructions (Signed)
Limit your sodium (Salt) intake    It is important that you exercise regularly, at least 20 minutes 3 to 4 times per week.  If you develop chest pain or shortness of breath seek  medical attention.  Return in 6 months for follow-up  

## 2012-12-15 ENCOUNTER — Telehealth: Payer: Self-pay | Admitting: *Deleted

## 2012-12-15 ENCOUNTER — Ambulatory Visit: Payer: Medicare Other

## 2012-12-15 ENCOUNTER — Other Ambulatory Visit: Payer: Medicare Other | Admitting: Lab

## 2012-12-15 NOTE — Telephone Encounter (Signed)
Left pt message to call [phone] and ask to speak with schedulers to reschedule today's missed appointment

## 2012-12-18 ENCOUNTER — Ambulatory Visit (HOSPITAL_BASED_OUTPATIENT_CLINIC_OR_DEPARTMENT_OTHER): Payer: Medicare Other | Admitting: Lab

## 2012-12-18 ENCOUNTER — Ambulatory Visit (HOSPITAL_BASED_OUTPATIENT_CLINIC_OR_DEPARTMENT_OTHER): Payer: Medicare Other

## 2012-12-18 VITALS — BP 132/63 | HR 72 | Temp 96.8°F | Resp 20

## 2012-12-18 DIAGNOSIS — D471 Chronic myeloproliferative disease: Secondary | ICD-10-CM

## 2012-12-18 DIAGNOSIS — D47Z9 Other specified neoplasms of uncertain behavior of lymphoid, hematopoietic and related tissue: Secondary | ICD-10-CM | POA: Diagnosis not present

## 2012-12-18 LAB — CBC WITH DIFFERENTIAL/PLATELET
Basophils Absolute: 0.1 10*3/uL (ref 0.0–0.1)
HCT: 28.7 % — ABNORMAL LOW (ref 34.8–46.6)
HGB: 9.7 g/dL — ABNORMAL LOW (ref 11.6–15.9)
MONO#: 0.6 10*3/uL (ref 0.1–0.9)
NEUT#: 6.2 10*3/uL (ref 1.5–6.5)
NEUT%: 67.7 % (ref 38.4–76.8)
WBC: 9.2 10*3/uL (ref 3.9–10.3)
lymph#: 2.2 10*3/uL (ref 0.9–3.3)

## 2012-12-18 MED ORDER — DARBEPOETIN ALFA-POLYSORBATE 300 MCG/0.6ML IJ SOLN
300.0000 ug | Freq: Once | INTRAMUSCULAR | Status: AC
  Administered 2012-12-18: 300 ug via SUBCUTANEOUS
  Filled 2012-12-18: qty 0.6

## 2013-01-12 ENCOUNTER — Ambulatory Visit: Payer: Medicare Other

## 2013-01-12 ENCOUNTER — Other Ambulatory Visit (HOSPITAL_BASED_OUTPATIENT_CLINIC_OR_DEPARTMENT_OTHER): Payer: Medicare Other | Admitting: Lab

## 2013-01-12 DIAGNOSIS — D47Z9 Other specified neoplasms of uncertain behavior of lymphoid, hematopoietic and related tissue: Secondary | ICD-10-CM

## 2013-01-12 DIAGNOSIS — D471 Chronic myeloproliferative disease: Secondary | ICD-10-CM

## 2013-01-12 LAB — CBC WITH DIFFERENTIAL/PLATELET
BASO%: 1.3 % (ref 0.0–2.0)
Basophils Absolute: 0.1 10*3/uL (ref 0.0–0.1)
EOS%: 1.9 % (ref 0.0–7.0)
HGB: 11.1 g/dL — ABNORMAL LOW (ref 11.6–15.9)
MCH: 29.2 pg (ref 25.1–34.0)
MCHC: 34.1 g/dL (ref 31.5–36.0)
MCV: 85.8 fL (ref 79.5–101.0)
MONO%: 5.7 % (ref 0.0–14.0)
RBC: 3.79 10*6/uL (ref 3.70–5.45)
RDW: 17.2 % — ABNORMAL HIGH (ref 11.2–14.5)

## 2013-01-12 MED ORDER — DARBEPOETIN ALFA-POLYSORBATE 300 MCG/0.6ML IJ SOLN: 300.0000 ug | Freq: Once | INTRAMUSCULAR | Status: DC

## 2013-02-09 ENCOUNTER — Ambulatory Visit (HOSPITAL_BASED_OUTPATIENT_CLINIC_OR_DEPARTMENT_OTHER): Payer: Medicare Other

## 2013-02-09 ENCOUNTER — Other Ambulatory Visit (HOSPITAL_BASED_OUTPATIENT_CLINIC_OR_DEPARTMENT_OTHER): Payer: Medicare Other | Admitting: Lab

## 2013-02-09 VITALS — BP 130/64 | HR 63 | Temp 97.3°F

## 2013-02-09 DIAGNOSIS — D471 Chronic myeloproliferative disease: Secondary | ICD-10-CM

## 2013-02-09 DIAGNOSIS — D47Z9 Other specified neoplasms of uncertain behavior of lymphoid, hematopoietic and related tissue: Secondary | ICD-10-CM

## 2013-02-09 DIAGNOSIS — D469 Myelodysplastic syndrome, unspecified: Secondary | ICD-10-CM | POA: Diagnosis not present

## 2013-02-09 DIAGNOSIS — D649 Anemia, unspecified: Secondary | ICD-10-CM

## 2013-02-09 LAB — CBC WITH DIFFERENTIAL/PLATELET
EOS%: 2.1 % (ref 0.0–7.0)
Eosinophils Absolute: 0.2 10*3/uL (ref 0.0–0.5)
MCV: 84.2 fL (ref 79.5–101.0)
MONO%: 8 % (ref 0.0–14.0)
NEUT#: 4.3 10*3/uL (ref 1.5–6.5)
RBC: 3.54 10*6/uL — ABNORMAL LOW (ref 3.70–5.45)
RDW: 16.2 % — ABNORMAL HIGH (ref 11.2–14.5)
lymph#: 2 10*3/uL (ref 0.9–3.3)

## 2013-02-09 MED ORDER — DARBEPOETIN ALFA-POLYSORBATE 300 MCG/0.6ML IJ SOLN
300.0000 ug | Freq: Once | INTRAMUSCULAR | Status: AC
  Administered 2013-02-09: 300 ug via SUBCUTANEOUS
  Filled 2013-02-09: qty 0.6

## 2013-02-26 ENCOUNTER — Other Ambulatory Visit: Payer: Self-pay | Admitting: Internal Medicine

## 2013-03-02 DIAGNOSIS — D045 Carcinoma in situ of skin of trunk: Secondary | ICD-10-CM | POA: Diagnosis not present

## 2013-03-02 DIAGNOSIS — D239 Other benign neoplasm of skin, unspecified: Secondary | ICD-10-CM | POA: Diagnosis not present

## 2013-03-02 DIAGNOSIS — C44529 Squamous cell carcinoma of skin of other part of trunk: Secondary | ICD-10-CM

## 2013-03-02 DIAGNOSIS — D485 Neoplasm of uncertain behavior of skin: Secondary | ICD-10-CM | POA: Diagnosis not present

## 2013-03-02 DIAGNOSIS — L821 Other seborrheic keratosis: Secondary | ICD-10-CM | POA: Diagnosis not present

## 2013-03-02 DIAGNOSIS — L570 Actinic keratosis: Secondary | ICD-10-CM | POA: Diagnosis not present

## 2013-03-02 DIAGNOSIS — D225 Melanocytic nevi of trunk: Secondary | ICD-10-CM

## 2013-03-02 HISTORY — DX: Melanocytic nevi of trunk: D22.5

## 2013-03-02 HISTORY — DX: Squamous cell carcinoma of skin of other part of trunk: C44.529

## 2013-03-09 ENCOUNTER — Other Ambulatory Visit (HOSPITAL_BASED_OUTPATIENT_CLINIC_OR_DEPARTMENT_OTHER): Payer: Medicare Other | Admitting: Lab

## 2013-03-09 ENCOUNTER — Ambulatory Visit: Payer: Medicare Other

## 2013-03-09 DIAGNOSIS — D47Z9 Other specified neoplasms of uncertain behavior of lymphoid, hematopoietic and related tissue: Secondary | ICD-10-CM

## 2013-03-09 DIAGNOSIS — D471 Chronic myeloproliferative disease: Secondary | ICD-10-CM

## 2013-03-09 LAB — CBC WITH DIFFERENTIAL/PLATELET
Eosinophils Absolute: 0.1 10*3/uL (ref 0.0–0.5)
MONO#: 0.3 10*3/uL (ref 0.1–0.9)
NEUT#: 4.2 10*3/uL (ref 1.5–6.5)
Platelets: 183 10*3/uL (ref 145–400)
RBC: 3.87 10*6/uL (ref 3.70–5.45)
RDW: 17 % — ABNORMAL HIGH (ref 11.2–14.5)
WBC: 6.6 10*3/uL (ref 3.9–10.3)

## 2013-03-09 MED ORDER — DARBEPOETIN ALFA-POLYSORBATE 300 MCG/0.6ML IJ SOLN: 300.0000 ug | Freq: Once | INTRAMUSCULAR | Status: DC

## 2013-03-19 DIAGNOSIS — R4182 Altered mental status, unspecified: Secondary | ICD-10-CM | POA: Diagnosis not present

## 2013-03-19 DIAGNOSIS — N289 Disorder of kidney and ureter, unspecified: Secondary | ICD-10-CM | POA: Diagnosis not present

## 2013-03-19 DIAGNOSIS — Q619 Cystic kidney disease, unspecified: Secondary | ICD-10-CM | POA: Diagnosis not present

## 2013-04-06 ENCOUNTER — Other Ambulatory Visit (HOSPITAL_BASED_OUTPATIENT_CLINIC_OR_DEPARTMENT_OTHER): Payer: Medicare Other | Admitting: Lab

## 2013-04-06 ENCOUNTER — Ambulatory Visit (HOSPITAL_BASED_OUTPATIENT_CLINIC_OR_DEPARTMENT_OTHER): Payer: Medicare Other

## 2013-04-06 VITALS — BP 129/54 | HR 80 | Temp 98.1°F

## 2013-04-06 DIAGNOSIS — D471 Chronic myeloproliferative disease: Secondary | ICD-10-CM

## 2013-04-06 DIAGNOSIS — D47Z9 Other specified neoplasms of uncertain behavior of lymphoid, hematopoietic and related tissue: Secondary | ICD-10-CM

## 2013-04-06 LAB — CBC WITH DIFFERENTIAL/PLATELET
Eosinophils Absolute: 0.2 10*3/uL (ref 0.0–0.5)
HCT: 29.5 % — ABNORMAL LOW (ref 34.8–46.6)
HGB: 9.7 g/dL — ABNORMAL LOW (ref 11.6–15.9)
LYMPH%: 27.1 % (ref 14.0–49.7)
MONO#: 0.6 10*3/uL (ref 0.1–0.9)
NEUT#: 5.1 10*3/uL (ref 1.5–6.5)
NEUT%: 63.1 % (ref 38.4–76.8)
Platelets: 222 10*3/uL (ref 145–400)
WBC: 8.1 10*3/uL (ref 3.9–10.3)

## 2013-04-06 MED ORDER — DARBEPOETIN ALFA-POLYSORBATE 300 MCG/0.6ML IJ SOLN
300.0000 ug | Freq: Once | INTRAMUSCULAR | Status: AC
  Administered 2013-04-06: 300 ug via SUBCUTANEOUS
  Filled 2013-04-06: qty 0.6

## 2013-04-07 ENCOUNTER — Encounter: Payer: Self-pay | Admitting: Gynecology

## 2013-04-20 ENCOUNTER — Ambulatory Visit (INDEPENDENT_AMBULATORY_CARE_PROVIDER_SITE_OTHER): Payer: Medicare Other | Admitting: Gynecology

## 2013-04-20 ENCOUNTER — Telehealth: Payer: Self-pay | Admitting: Oncology

## 2013-04-20 ENCOUNTER — Ambulatory Visit (HOSPITAL_BASED_OUTPATIENT_CLINIC_OR_DEPARTMENT_OTHER): Payer: Medicare Other | Admitting: Nurse Practitioner

## 2013-04-20 ENCOUNTER — Encounter: Payer: Self-pay | Admitting: Gynecology

## 2013-04-20 ENCOUNTER — Other Ambulatory Visit (HOSPITAL_BASED_OUTPATIENT_CLINIC_OR_DEPARTMENT_OTHER): Payer: Medicare Other | Admitting: Lab

## 2013-04-20 VITALS — BP 143/60 | HR 65 | Temp 97.2°F | Resp 18 | Ht 60.0 in | Wt 145.0 lb

## 2013-04-20 VITALS — BP 124/66 | Ht 59.0 in | Wt 144.0 lb

## 2013-04-20 DIAGNOSIS — N816 Rectocele: Secondary | ICD-10-CM

## 2013-04-20 DIAGNOSIS — R82998 Other abnormal findings in urine: Secondary | ICD-10-CM | POA: Diagnosis not present

## 2013-04-20 DIAGNOSIS — M949 Disorder of cartilage, unspecified: Secondary | ICD-10-CM | POA: Diagnosis not present

## 2013-04-20 DIAGNOSIS — N8111 Cystocele, midline: Secondary | ICD-10-CM | POA: Diagnosis not present

## 2013-04-20 DIAGNOSIS — D649 Anemia, unspecified: Secondary | ICD-10-CM | POA: Diagnosis not present

## 2013-04-20 DIAGNOSIS — M8580 Other specified disorders of bone density and structure, unspecified site: Secondary | ICD-10-CM

## 2013-04-20 DIAGNOSIS — D469 Myelodysplastic syndrome, unspecified: Secondary | ICD-10-CM

## 2013-04-20 DIAGNOSIS — D471 Chronic myeloproliferative disease: Secondary | ICD-10-CM

## 2013-04-20 DIAGNOSIS — M899 Disorder of bone, unspecified: Secondary | ICD-10-CM | POA: Diagnosis not present

## 2013-04-20 DIAGNOSIS — D47Z9 Other specified neoplasms of uncertain behavior of lymphoid, hematopoietic and related tissue: Secondary | ICD-10-CM | POA: Diagnosis not present

## 2013-04-20 DIAGNOSIS — N952 Postmenopausal atrophic vaginitis: Secondary | ICD-10-CM | POA: Diagnosis not present

## 2013-04-20 LAB — COMPREHENSIVE METABOLIC PANEL (CC13)
ALT: 13 U/L (ref 0–55)
BUN: 27.4 mg/dL — ABNORMAL HIGH (ref 7.0–26.0)
CO2: 24 mEq/L (ref 22–29)
Calcium: 9.1 mg/dL (ref 8.4–10.4)
Chloride: 109 mEq/L — ABNORMAL HIGH (ref 98–107)
Creatinine: 1.1 mg/dL (ref 0.6–1.1)
Glucose: 129 mg/dl — ABNORMAL HIGH (ref 70–99)
Total Bilirubin: 0.85 mg/dL (ref 0.20–1.20)

## 2013-04-20 LAB — MORPHOLOGY: PLT EST: ADEQUATE

## 2013-04-20 LAB — CBC WITH DIFFERENTIAL/PLATELET
BASO%: 1 % (ref 0.0–2.0)
HCT: 31.7 % — ABNORMAL LOW (ref 34.8–46.6)
LYMPH%: 23.7 % (ref 14.0–49.7)
MCH: 29.8 pg (ref 25.1–34.0)
MCHC: 34.8 g/dL (ref 31.5–36.0)
MONO#: 0.4 10*3/uL (ref 0.1–0.9)
NEUT%: 66.6 % (ref 38.4–76.8)
Platelets: 186 10*3/uL (ref 145–400)
WBC: 6.2 10*3/uL (ref 3.9–10.3)

## 2013-04-20 LAB — LACTATE DEHYDROGENASE (CC13): LDH: 156 U/L (ref 125–245)

## 2013-04-20 NOTE — Telephone Encounter (Signed)
Gave pt appt for labs MD and injection 2014 from June and to November 2014

## 2013-04-20 NOTE — Progress Notes (Signed)
Brittany [name] 12-03-1924 [id number]        79 y.o.  [id number] for followup exam.  Several issues noted below.  Past medical history,surgical history, medications, allergies, family history and social history were all reviewed and documented in the EPIC chart. ROS:  Was performed and pertinent positives and negatives are included in the history.  Exam: Kim assistant Filed Vitals:   04/20/13 1531  BP: 124/66  Height: 4\' 11"  (1.499 m)  Weight: 144 lb (65.318 kg)   General appearance  Normal Skin grossly normal Head/Neck normal with no cervical or supraclavicular adenopathy thyroid normal Lungs  clear Cardiac RR, without RMG Abdominal  soft, nontender, without masses, organomegaly or hernia Breasts  examined lying and sitting without masses, retractions, discharge or axillary adenopathy. Pelvic  Ext/BUS/vagina  atrophic changes. Second degree cystocele, first to second degree rectocele.   Adnexa  Without masses or tenderness    Anus and perineum  normal   Rectovaginal  normal sphincter tone without palpated masses or tenderness.    Assessment/Plan:  79 y.o. [id number] female for followup exam.   1. Cystocele/rectocele. History of vaginal hysterectomy for prolapse previously. Stable on exam. Patient is asymptomatic we'll plan expectant management. 2. Atrophic genital changes/post menopausal. Patients without significant symptoms of hot flashes night sweats vaginal dryness or irritation. We'll continue to monitor. 3. Osteopenia. DEXA 2008 with T score -2.1. [facility] followed [facility] but I am unsure if she's getting followup. Both her and her husband's memories are poor on questioning as far as when her last bone density is. I recommended she go ahead and schedule one now in our office as we have her here and she will go ahead and do so. 4. Pap smear 2011. No Pap smear done today. She is status post hysterectomy for benign indications and over the age of 41 and we both agreed to stop  screening. 5. Mammography 2010. She cannot remember when her last mammogram was. I stressed to her husband who is with her the importance of annual mammography predicted with a maternal history of breast cancer and he agrees to schedule this for her. SBE discussed monthly. 6. Colonoscopy 2010. She has listed a flexible sigmoidoscopy 2013 in her surgical history. We'll followup with them as far as recommended repeat interval. 7. Health maintenance. No lab work done as it is done through her primary physician's office. She'll continue to followup with them for her medical issues and ongoing general health maintenance.    Anastasio Auerbach MD, 4:32 PM 04/20/2013

## 2013-04-20 NOTE — Progress Notes (Signed)
OFFICE PROGRESS NOTE  Interval history:  Ms. Lamphier is an 79 year old woman with a myeloproliferative disorder with dysplastic features initially diagnosed May of 2008. She was initially followed with observation until the hemoglobin fell to 8.21 May 2009. She subsequently began a trial of Aranesp and has had a good response. She currently receives 300 mcg every 4 weeks and is maintaining her hemoglobin in the upper 9 to 11 range.  She is seen today for scheduled followup. She is accompanied by her husband. She reports that she is feeling well. No interim illnesses or infections. She denies shortness of breath. She feels that she has a good energy level. Her husband disagrees. He thinks that she is sleeping too much. She continues to have memory problems. Both Namenda and Aricept are on her medication list. They are unsure if she is taking one or both.   Objective: Blood pressure 143/60, pulse 65, temperature 97.2 F (36.2 C), temperature source Oral, resp. rate 18, height 5' (1.524 m), weight 145 lb (65.772 kg).  No thrush or ulcerations. Lungs clear. Regular cardiac rhythm. Abdomen is soft and nontender. No organomegaly. Extremities are without edema. She is alert and oriented. Hard of hearing.  Lab Results: Lab Results  Component Value Date   WBC 6.2 04/20/2013   HGB 11.0* 04/20/2013   HCT 31.7* 04/20/2013   MCV 85.7 04/20/2013   PLT 186 04/20/2013    Chemistry:    Chemistry      Component Value Date/Time   NA 141 04/20/2013 1320   NA 138 07/09/2012 0355   K 4.4 04/20/2013 1320   K 4.0 07/09/2012 0355   CL 109* 04/20/2013 1320   CL 102 07/09/2012 0355   CO2 24 04/20/2013 1320   CO2 22 07/09/2012 0355   BUN 27.4* 04/20/2013 1320   BUN 31* 07/09/2012 0355   CREATININE 1.1 04/20/2013 1320   CREATININE 1.09 07/09/2012 0355      Component Value Date/Time   CALCIUM 9.1 04/20/2013 1320   CALCIUM 9.2 07/09/2012 0355   ALKPHOS 74 04/20/2013 1320   ALKPHOS 70 02/04/2012 1042   AST 14 04/20/2013 1320   AST 19  02/04/2012 1042   ALT 13 04/20/2013 1320   ALT 24 02/04/2012 1042   BILITOT 0.85 04/20/2013 1320   BILITOT 0.6 02/04/2012 1042       Studies/Results: No results found.  Medications: I have reviewed the patient's current medications.  Assessment/Plan:  1. Myeloproliferative disorder with dysplastic features diagnosed May 2008 presenting with an unexplained normochromic anemia. Trial of Aranesp initiated June 2010 when the hemoglobin fell. Current dose is 300 mcg every 4 weeks. 2. Hypothyroid on replacement. 3. Memory issues. They plan to contact neurology for clarification regarding Namenda and Aricept.  Disposition-Ms. Disla's hemoglobin remains stable on Aranesp 300 mcg every 4 weeks. Plan to continue the same. She will return for a followup visit in 6 months. She will contact the office in the interim with any problems.  Plan reviewed with Dr. Beryle Beams.  Ned Card ANP/GNP-BC

## 2013-04-20 NOTE — Patient Instructions (Addendum)
Followup for bone density as scheduled.  Schedule and continue with annual mammograms. Below are facilities available in [place].   Facilities in [place]: 1)  [facility], [place] Phone: [phone] 2)  [facility]. [address] [place] Phone: [phone] 3)  Dr. Isaiah Blakes at [facility] [address] Phone: [phone]     Mammogram A mammogram is an X-ray test to find changes in a woman's breast. You should get a mammogram if:  You are 77 years of age or older  You have risk factors.   Your doctor recommends that you have one.  BEFORE THE TEST  Do not schedule the test the week before your period, especially if your breasts are sore during this time.  On the day of your mammogram:  Wash your breasts and armpits well. After washing, do not put on any deodorant or talcum powder on until after your test.   Eat and drink as you usually do.   Take your medicines as usual.   If you are diabetic and take insulin, make sure you:   Eat before coming for your test.   Take your insulin as usual.   If you cannot keep your appointment, call before the appointment to cancel. Schedule another appointment.  TEST  You will need to undress from the waist up. You will put on a hospital gown.   Your breast will be put on the mammogram machine, and it will press firmly on your breast with a piece of plastic called a compression paddle. This will make your breast flatter so that the machine can X-ray all parts of your breast.   Both breasts will be X-rayed. Each breast will be X-rayed from above and from the side. An X-ray might need to be taken again if the picture is not good enough.   The mammogram will last about 15 to 30 minutes.  AFTER THE TEST Finding out the results of your test Ask when your test results will be ready. Make sure you get your test  results.  Document Released: 02/28/2009 Document Revised: 11/21/2011 Document Reviewed: 02/28/2009 [facility] Patient Information 2012 [place].

## 2013-04-21 ENCOUNTER — Telehealth: Payer: Self-pay | Admitting: Internal Medicine

## 2013-04-21 LAB — URINALYSIS W MICROSCOPIC + REFLEX CULTURE
Bacteria, UA: NONE SEEN
Crystals: NONE SEEN
Ketones, ur: NEGATIVE mg/dL
Nitrite: NEGATIVE
Specific Gravity, Urine: 1.02 (ref 1.005–1.030)
Urobilinogen, UA: 0.2 mg/dL (ref 0.0–1.0)
pH: 7 (ref 5.0–8.0)

## 2013-04-21 NOTE — Telephone Encounter (Signed)
Pt has had a stroke and husband is trying to manage pt's meds. Husband would like you to know (and fix in the system) that pt is taking generic for SYNTHROID 88 MCG tablet ( LEVOTHYROXINE).  He would like her papers to convey this because he gets confused if they don't match. Husband has been giving pt both scripts from Dr Leanne Chang and from MD at [place]. The one for LEVOTHYROXINE and the one for SYNTHROID, because he did not know they were the same.

## 2013-04-22 MED ORDER — LEVOTHYROXINE SODIUM 88 MCG PO TABS: ORAL_TABLET | ORAL | Status: DC

## 2013-04-22 NOTE — Telephone Encounter (Signed)
Med list updated

## 2013-04-23 LAB — URINE CULTURE: Colony Count: NO GROWTH

## 2013-04-30 ENCOUNTER — Encounter: Payer: Self-pay | Admitting: Gynecology

## 2013-05-05 ENCOUNTER — Other Ambulatory Visit (HOSPITAL_BASED_OUTPATIENT_CLINIC_OR_DEPARTMENT_OTHER): Payer: Medicare Other | Admitting: Lab

## 2013-05-05 ENCOUNTER — Ambulatory Visit: Payer: Medicare Other

## 2013-05-05 DIAGNOSIS — D469 Myelodysplastic syndrome, unspecified: Secondary | ICD-10-CM

## 2013-05-05 DIAGNOSIS — D471 Chronic myeloproliferative disease: Secondary | ICD-10-CM

## 2013-05-05 LAB — CBC WITH DIFFERENTIAL/PLATELET
Basophils Absolute: 0.1 10*3/uL (ref 0.0–0.1)
Eosinophils Absolute: 0.2 10*3/uL (ref 0.0–0.5)
HGB: 11.2 g/dL — ABNORMAL LOW (ref 11.6–15.9)
MONO#: 0.5 10*3/uL (ref 0.1–0.9)
NEUT#: 4.4 10*3/uL (ref 1.5–6.5)
Platelets: 211 10*3/uL (ref 145–400)
RBC: 3.8 10*6/uL (ref 3.70–5.45)
RDW: 17.2 % — ABNORMAL HIGH (ref 11.2–14.5)
WBC: 6.9 10*3/uL (ref 3.9–10.3)

## 2013-05-05 MED ORDER — DARBEPOETIN ALFA-POLYSORBATE 300 MCG/0.6ML IJ SOLN: 300.0000 ug | Freq: Once | INTRAMUSCULAR | Status: DC

## 2013-05-11 DIAGNOSIS — R413 Other amnesia: Secondary | ICD-10-CM | POA: Diagnosis not present

## 2013-05-11 DIAGNOSIS — R4189 Other symptoms and signs involving cognitive functions and awareness: Secondary | ICD-10-CM | POA: Diagnosis not present

## 2013-05-25 ENCOUNTER — Other Ambulatory Visit: Payer: Self-pay | Admitting: *Deleted

## 2013-05-25 MED ORDER — LOSARTAN POTASSIUM 100 MG PO TABS: ORAL_TABLET | ORAL | Status: DC

## 2013-05-31 ENCOUNTER — Ambulatory Visit (INDEPENDENT_AMBULATORY_CARE_PROVIDER_SITE_OTHER): Payer: Medicare Other | Admitting: Internal Medicine

## 2013-05-31 ENCOUNTER — Encounter: Payer: Self-pay | Admitting: Internal Medicine

## 2013-05-31 VITALS — BP 140/60 | HR 80 | Temp 98.1°F | Resp 20 | Wt 148.0 lb

## 2013-05-31 DIAGNOSIS — E119 Type 2 diabetes mellitus without complications: Secondary | ICD-10-CM | POA: Diagnosis not present

## 2013-05-31 DIAGNOSIS — I10 Essential (primary) hypertension: Secondary | ICD-10-CM

## 2013-05-31 DIAGNOSIS — D469 Myelodysplastic syndrome, unspecified: Secondary | ICD-10-CM | POA: Diagnosis not present

## 2013-05-31 DIAGNOSIS — F0390 Unspecified dementia without behavioral disturbance: Secondary | ICD-10-CM | POA: Diagnosis not present

## 2013-05-31 NOTE — Patient Instructions (Addendum)
Limit your sodium (Salt) intake  Return in 6 months for follow-up  

## 2013-05-31 NOTE — Progress Notes (Signed)
  Subjective:    Patient ID: Brittany [name], female    DOB: 07/31/1924, 79 y.o.   MRN: [id number]  HPI  79 year old patient who is accompanied by her daughter. She has a history of myelodysplastic syndrome hypothyroidism and type 2 diabetes. Her diabetes is managed with diet. Presently on no medications. She is on levothyroxin supplementation as well as both Aricept and Namenda. She has done quite well. She has treated hypertension which has been stable. No new concerns or complaints.    Review of Systems  Constitutional: Negative.   HENT: Negative for hearing loss, congestion, sore throat, rhinorrhea, dental problem, sinus pressure and tinnitus.   Eyes: Negative for pain, discharge and visual disturbance.  Respiratory: Negative for cough and shortness of breath.   Cardiovascular: Negative for chest pain, palpitations and leg swelling.  Gastrointestinal: Negative for nausea, vomiting, abdominal pain, diarrhea, constipation, blood in stool and abdominal distention.  Genitourinary: Negative for dysuria, urgency, frequency, hematuria, flank pain, vaginal bleeding, vaginal discharge, difficulty urinating, vaginal pain and pelvic pain.  Musculoskeletal: Negative for joint swelling, arthralgias and gait problem.  Skin: Negative for rash.  Neurological: Negative for dizziness, syncope, speech difficulty, weakness, numbness and headaches.  Hematological: Negative for adenopathy.  Psychiatric/Behavioral: Positive for confusion and decreased concentration. Negative for behavioral problems, dysphoric mood and agitation. The patient is not nervous/anxious.        Objective:   Physical Exam  Constitutional: She is oriented to person, place, and time. She appears well-developed and well-nourished.  HENT:  Head: Normocephalic.  Right Ear: External ear normal.  Left Ear: External ear normal.  Mouth/Throat: Oropharynx is clear and moist.  Eyes: Conjunctivae and EOM are normal. Pupils are equal, round,  and reactive to light.  Neck: Normal range of motion. Neck supple. No thyromegaly present.  Cardiovascular: Normal rate, regular rhythm, normal heart sounds and intact distal pulses.   Pulmonary/Chest: Effort normal and breath sounds normal.  Abdominal: Soft. Bowel sounds are normal. She exhibits no mass. There is no tenderness.  Musculoskeletal: Normal range of motion.  Lymphadenopathy:    She has no cervical adenopathy.  Neurological: She is alert and oriented to person, place, and time.  Skin: Skin is warm and dry. No rash noted.  Psychiatric: She has a normal mood and affect. Her behavior is normal.          Assessment & Plan:   Hypertension well controlled Diabetes mellitus type 2 Dementia Myeloproliferative disorder  Laboratory update Recheck 3 months No  change in medication

## 2013-06-01 ENCOUNTER — Ambulatory Visit (HOSPITAL_BASED_OUTPATIENT_CLINIC_OR_DEPARTMENT_OTHER): Payer: Medicare Other

## 2013-06-01 ENCOUNTER — Other Ambulatory Visit (HOSPITAL_BASED_OUTPATIENT_CLINIC_OR_DEPARTMENT_OTHER): Payer: Medicare Other | Admitting: Lab

## 2013-06-01 VITALS — BP 137/49 | HR 72 | Temp 98.4°F

## 2013-06-01 DIAGNOSIS — D47Z9 Other specified neoplasms of uncertain behavior of lymphoid, hematopoietic and related tissue: Secondary | ICD-10-CM

## 2013-06-01 DIAGNOSIS — D471 Chronic myeloproliferative disease: Secondary | ICD-10-CM

## 2013-06-01 LAB — CBC WITH DIFFERENTIAL/PLATELET
BASO%: 1.8 % (ref 0.0–2.0)
EOS%: 2.1 % (ref 0.0–7.0)
Eosinophils Absolute: 0.1 10*3/uL (ref 0.0–0.5)
LYMPH%: 29.3 % (ref 14.0–49.7)
MCHC: 35.4 g/dL (ref 31.5–36.0)
MCV: 84 fL (ref 79.5–101.0)
MONO%: 6.9 % (ref 0.0–14.0)
NEUT#: 3.7 10*3/uL (ref 1.5–6.5)
RBC: 3.32 10*6/uL — ABNORMAL LOW (ref 3.70–5.45)
RDW: 17.1 % — ABNORMAL HIGH (ref 11.2–14.5)

## 2013-06-01 MED ORDER — DARBEPOETIN ALFA-POLYSORBATE 300 MCG/0.6ML IJ SOLN
300.0000 ug | Freq: Once | INTRAMUSCULAR | Status: AC
  Administered 2013-06-01: 300 ug via SUBCUTANEOUS
  Filled 2013-06-01: qty 0.6

## 2013-06-02 ENCOUNTER — Other Ambulatory Visit: Payer: Self-pay | Admitting: Internal Medicine

## 2013-06-29 ENCOUNTER — Ambulatory Visit (HOSPITAL_BASED_OUTPATIENT_CLINIC_OR_DEPARTMENT_OTHER): Payer: Medicare Other

## 2013-06-29 ENCOUNTER — Other Ambulatory Visit (HOSPITAL_BASED_OUTPATIENT_CLINIC_OR_DEPARTMENT_OTHER): Payer: Medicare Other | Admitting: Lab

## 2013-06-29 VITALS — BP 134/48 | HR 71 | Temp 97.6°F

## 2013-06-29 DIAGNOSIS — D47Z9 Other specified neoplasms of uncertain behavior of lymphoid, hematopoietic and related tissue: Secondary | ICD-10-CM

## 2013-06-29 DIAGNOSIS — D471 Chronic myeloproliferative disease: Secondary | ICD-10-CM

## 2013-06-29 LAB — CBC WITH DIFFERENTIAL/PLATELET
BASO%: 1.4 % (ref 0.0–2.0)
Eosinophils Absolute: 0.1 10*3/uL (ref 0.0–0.5)
MCHC: 34.9 g/dL (ref 31.5–36.0)
MONO#: 0.6 10*3/uL (ref 0.1–0.9)
NEUT#: 4.2 10*3/uL (ref 1.5–6.5)
Platelets: 191 10*3/uL (ref 145–400)
RBC: 3.45 10*6/uL — ABNORMAL LOW (ref 3.70–5.45)
RDW: 16.8 % — ABNORMAL HIGH (ref 11.2–14.5)
WBC: 6.6 10*3/uL (ref 3.9–10.3)
lymph#: 1.5 10*3/uL (ref 0.9–3.3)

## 2013-06-29 MED ORDER — DARBEPOETIN ALFA-POLYSORBATE 300 MCG/0.6ML IJ SOLN
300.0000 ug | Freq: Once | INTRAMUSCULAR | Status: AC
  Administered 2013-06-29: 300 ug via SUBCUTANEOUS
  Filled 2013-06-29: qty 0.6

## 2013-06-29 MED ORDER — DARBEPOETIN ALFA-POLYSORBATE 300 MCG/0.6ML IJ SOLN: 300.0000 ug | Freq: Once | INTRAMUSCULAR | Status: DC

## 2013-07-23 DIAGNOSIS — Z1231 Encounter for screening mammogram for malignant neoplasm of breast: Secondary | ICD-10-CM | POA: Diagnosis not present

## 2013-08-03 ENCOUNTER — Ambulatory Visit (HOSPITAL_BASED_OUTPATIENT_CLINIC_OR_DEPARTMENT_OTHER): Payer: Medicare Other

## 2013-08-03 ENCOUNTER — Other Ambulatory Visit (HOSPITAL_BASED_OUTPATIENT_CLINIC_OR_DEPARTMENT_OTHER): Payer: Medicare Other | Admitting: Lab

## 2013-08-03 VITALS — BP 137/62 | HR 75 | Temp 98.1°F

## 2013-08-03 DIAGNOSIS — D47Z9 Other specified neoplasms of uncertain behavior of lymphoid, hematopoietic and related tissue: Secondary | ICD-10-CM

## 2013-08-03 DIAGNOSIS — D469 Myelodysplastic syndrome, unspecified: Secondary | ICD-10-CM | POA: Diagnosis not present

## 2013-08-03 DIAGNOSIS — D471 Chronic myeloproliferative disease: Secondary | ICD-10-CM

## 2013-08-03 LAB — CBC WITH DIFFERENTIAL/PLATELET
BASO%: 1 % (ref 0.0–2.0)
Eosinophils Absolute: 0.2 10*3/uL (ref 0.0–0.5)
HCT: 30.2 % — ABNORMAL LOW (ref 34.8–46.6)
LYMPH%: 25.3 % (ref 14.0–49.7)
MCHC: 34.7 g/dL (ref 31.5–36.0)
MONO#: 0.5 10*3/uL (ref 0.1–0.9)
NEUT#: 4.6 10*3/uL (ref 1.5–6.5)
NEUT%: 64.7 % (ref 38.4–76.8)
Platelets: 221 10*3/uL (ref 145–400)
WBC: 7.1 10*3/uL (ref 3.9–10.3)
lymph#: 1.8 10*3/uL (ref 0.9–3.3)

## 2013-08-03 LAB — HOLD TUBE, BLOOD BANK

## 2013-08-03 MED ORDER — DARBEPOETIN ALFA-POLYSORBATE 300 MCG/0.6ML IJ SOLN
300.0000 ug | Freq: Once | INTRAMUSCULAR | Status: AC
  Administered 2013-08-03: 300 ug via SUBCUTANEOUS
  Filled 2013-08-03: qty 0.6

## 2013-08-10 ENCOUNTER — Encounter: Payer: Self-pay | Admitting: Internal Medicine

## 2013-08-11 ENCOUNTER — Other Ambulatory Visit: Payer: Self-pay | Admitting: Internal Medicine

## 2013-08-11 NOTE — Telephone Encounter (Signed)
Pt has been seeing Dr Raliegh Ip but she has not seen you since 05/21/12

## 2013-08-31 ENCOUNTER — Other Ambulatory Visit (HOSPITAL_BASED_OUTPATIENT_CLINIC_OR_DEPARTMENT_OTHER): Payer: Medicare Other | Admitting: Lab

## 2013-08-31 ENCOUNTER — Other Ambulatory Visit: Payer: Self-pay | Admitting: Internal Medicine

## 2013-08-31 ENCOUNTER — Ambulatory Visit (HOSPITAL_BASED_OUTPATIENT_CLINIC_OR_DEPARTMENT_OTHER): Payer: Medicare Other

## 2013-08-31 VITALS — BP 135/47 | HR 71 | Temp 98.1°F

## 2013-08-31 DIAGNOSIS — D469 Myelodysplastic syndrome, unspecified: Secondary | ICD-10-CM

## 2013-08-31 DIAGNOSIS — D47Z9 Other specified neoplasms of uncertain behavior of lymphoid, hematopoietic and related tissue: Secondary | ICD-10-CM | POA: Diagnosis not present

## 2013-08-31 DIAGNOSIS — D649 Anemia, unspecified: Secondary | ICD-10-CM | POA: Diagnosis not present

## 2013-08-31 DIAGNOSIS — D471 Chronic myeloproliferative disease: Secondary | ICD-10-CM

## 2013-08-31 LAB — CBC WITH DIFFERENTIAL/PLATELET
Basophils Absolute: 0.1 10*3/uL (ref 0.0–0.1)
EOS%: 2.3 % (ref 0.0–7.0)
HCT: 31.7 % — ABNORMAL LOW (ref 34.8–46.6)
HGB: 10.7 g/dL — ABNORMAL LOW (ref 11.6–15.9)
MONO#: 0.5 10*3/uL (ref 0.1–0.9)
NEUT#: 4.4 10*3/uL (ref 1.5–6.5)
NEUT%: 66.9 % (ref 38.4–76.8)
RDW: 16.9 % — ABNORMAL HIGH (ref 11.2–14.5)
WBC: 6.5 10*3/uL (ref 3.9–10.3)
lymph#: 1.4 10*3/uL (ref 0.9–3.3)

## 2013-08-31 MED ORDER — DARBEPOETIN ALFA-POLYSORBATE 300 MCG/0.6ML IJ SOLN
300.0000 ug | Freq: Once | INTRAMUSCULAR | Status: AC
  Administered 2013-08-31: 300 ug via SUBCUTANEOUS
  Filled 2013-08-31: qty 0.6

## 2013-09-02 ENCOUNTER — Telehealth: Payer: Self-pay | Admitting: *Deleted

## 2013-09-02 NOTE — Telephone Encounter (Signed)
Message copied by Jesse Fall on Thu Sep 02, 2013 12:45 PM ------      Message from: Annia Belt      Created: Thu Sep 02, 2013 11:43 AM       Call pt lab stable ------

## 2013-09-02 NOTE — Telephone Encounter (Signed)
Called & spoke with pt's husband & informed that labs stable & pt may call if she has questions. Clarified f/u appts with husband.

## 2013-09-20 DIAGNOSIS — N289 Disorder of kidney and ureter, unspecified: Secondary | ICD-10-CM | POA: Diagnosis not present

## 2013-09-20 DIAGNOSIS — Q619 Cystic kidney disease, unspecified: Secondary | ICD-10-CM | POA: Diagnosis not present

## 2013-10-05 ENCOUNTER — Other Ambulatory Visit (HOSPITAL_BASED_OUTPATIENT_CLINIC_OR_DEPARTMENT_OTHER): Payer: Medicare Other | Admitting: Lab

## 2013-10-05 ENCOUNTER — Ambulatory Visit (HOSPITAL_BASED_OUTPATIENT_CLINIC_OR_DEPARTMENT_OTHER): Payer: Medicare Other

## 2013-10-05 VITALS — BP 129/54 | HR 72 | Temp 98.3°F

## 2013-10-05 DIAGNOSIS — D471 Chronic myeloproliferative disease: Secondary | ICD-10-CM

## 2013-10-05 DIAGNOSIS — D47Z9 Other specified neoplasms of uncertain behavior of lymphoid, hematopoietic and related tissue: Secondary | ICD-10-CM

## 2013-10-05 DIAGNOSIS — D649 Anemia, unspecified: Secondary | ICD-10-CM

## 2013-10-05 DIAGNOSIS — D469 Myelodysplastic syndrome, unspecified: Secondary | ICD-10-CM

## 2013-10-05 LAB — CBC WITH DIFFERENTIAL/PLATELET
Basophils Absolute: 0.1 10*3/uL (ref 0.0–0.1)
EOS%: 2.3 % (ref 0.0–7.0)
Eosinophils Absolute: 0.2 10*3/uL (ref 0.0–0.5)
HCT: 31.3 % — ABNORMAL LOW (ref 34.8–46.6)
HGB: 10.7 g/dL — ABNORMAL LOW (ref 11.6–15.9)
MCH: 28.4 pg (ref 25.1–34.0)
MCV: 83.2 fL (ref 79.5–101.0)
MONO%: 7.7 % (ref 0.0–14.0)
NEUT#: 4.7 10*3/uL (ref 1.5–6.5)
NEUT%: 60.4 % (ref 38.4–76.8)
RDW: 17.1 % — ABNORMAL HIGH (ref 11.2–14.5)
lymph#: 2.2 10*3/uL (ref 0.9–3.3)

## 2013-10-05 MED ORDER — DARBEPOETIN ALFA-POLYSORBATE 300 MCG/0.6ML IJ SOLN
300.0000 ug | Freq: Once | INTRAMUSCULAR | Status: AC
  Administered 2013-10-05: 300 ug via SUBCUTANEOUS
  Filled 2013-10-05: qty 0.6

## 2013-10-11 DIAGNOSIS — Z23 Encounter for immunization: Secondary | ICD-10-CM | POA: Diagnosis not present

## 2013-10-25 ENCOUNTER — Ambulatory Visit: Payer: Medicare Other | Admitting: Oncology

## 2013-11-02 ENCOUNTER — Ambulatory Visit: Payer: Medicare Other

## 2013-11-02 ENCOUNTER — Telehealth: Payer: Self-pay | Admitting: Oncology

## 2013-11-02 ENCOUNTER — Other Ambulatory Visit (HOSPITAL_BASED_OUTPATIENT_CLINIC_OR_DEPARTMENT_OTHER): Payer: Medicare Other | Admitting: Lab

## 2013-11-02 ENCOUNTER — Ambulatory Visit (HOSPITAL_BASED_OUTPATIENT_CLINIC_OR_DEPARTMENT_OTHER): Payer: Medicare Other | Admitting: Oncology

## 2013-11-02 VITALS — BP 146/57 | HR 72 | Temp 97.4°F | Resp 17 | Ht 59.0 in | Wt 148.1 lb

## 2013-11-02 DIAGNOSIS — D47Z9 Other specified neoplasms of uncertain behavior of lymphoid, hematopoietic and related tissue: Secondary | ICD-10-CM

## 2013-11-02 DIAGNOSIS — D649 Anemia, unspecified: Secondary | ICD-10-CM

## 2013-11-02 DIAGNOSIS — D469 Myelodysplastic syndrome, unspecified: Secondary | ICD-10-CM

## 2013-11-02 DIAGNOSIS — D471 Chronic myeloproliferative disease: Secondary | ICD-10-CM

## 2013-11-02 LAB — CBC WITH DIFFERENTIAL/PLATELET
Basophils Absolute: 0.1 10*3/uL (ref 0.0–0.1)
EOS%: 2.1 % (ref 0.0–7.0)
Eosinophils Absolute: 0.2 10*3/uL (ref 0.0–0.5)
HGB: 10.8 g/dL — ABNORMAL LOW (ref 11.6–15.9)
LYMPH%: 18.1 % (ref 14.0–49.7)
MCH: 28.1 pg (ref 25.1–34.0)
MCV: 85.4 fL (ref 79.5–101.0)
MONO%: 5 % (ref 0.0–14.0)
NEUT#: 5.8 10*3/uL (ref 1.5–6.5)
Platelets: 243 10*3/uL (ref 145–400)

## 2013-11-02 MED ORDER — DARBEPOETIN ALFA-POLYSORBATE 300 MCG/0.6ML IJ SOLN
300.0000 ug | Freq: Once | INTRAMUSCULAR | Status: AC
  Administered 2013-11-02: 300 ug via SUBCUTANEOUS
  Filled 2013-11-02: qty 0.6

## 2013-11-02 NOTE — Telephone Encounter (Signed)
Gave pt appt for lab,md and injection until May 2015

## 2013-11-07 NOTE — Progress Notes (Signed)
Hematology and Oncology Follow Up Visit  Brittany [name] [id number] [birth year] 79 y.o. 11/07/2013 2:06 PM   Principle Diagnosis: Encounter Diagnoses  Name Primary?  Marland Kitchen MYELODYSPLASTIC SYNDROME Yes  . Myeloproliferative disorder   . Normochromic normocytic anemia      Interim History:  Followup visit for this pleasant 79 year old woman with a myeloproliferative disorder with dysplastic features. She was diagnosed in May of 2008 when she presented with unexplained normochromic anemia. Bone marrow aspiration and biopsy was done on 04/23/2007. Marrow was hypercellular for age 24-80% Maturation was normal in all 3 cell lines and there were only 1% blasts. Cytogenetics were normal including FISH studies to look at chromosome 5 and chromosome 7 deletions which were not present. Hemoglobin was 9.7 at the time with MCV 90, Nader count 4700, and platelets 162,000. There were no ringed sideroblasts.  She was initially followed with observation alone until hemoglobin fell to 8.6 by June of 2010. She was started on a trial of Aranesp and had a nice response with rise in hemoglobin up to 12.5 g. She is currently on a dose of 300 mcg every 4 weeks and we have been able to maintain her hemoglobin at 10-11 g.  She has had no interim medical problems. She remains an avid reader. She is still teaching some piano lessons. She and her husband are in a book club with friends in [place].    Medications: reviewed  Allergies:  Allergies  Allergen Reactions  . Lisinopril     REACTION: angioedema  . Penicillins     REACTION: rash    Review of Systems: Hematology:  No abnormal bleeding or bruising ENT ROS: No sore throat  Respiratory ROS: No cough or dyspnea Cardiovascular ROS:   No chest pain or palpitations Gastrointestinal ROS: No abdominal pain or change in bowel habit Genito-Urinary ROS: No urinary tract symptoms Musculoskeletal ROS: No change in chronic arthritis pain Neurological ROS: No  headache or change in vision Dermatological ROS: No rash or ecchymosis Remaining ROS negative.  Physical Exam: Blood pressure 146/57, pulse 72, temperature 97.4 F (36.3 C), temperature source Oral, resp. rate 17, height 4\' 11"  (1.499 m), weight 148 lb 1.6 oz (67.178 kg), SpO2 98.00%. Wt Readings from Last 3 Encounters:  11/02/13 148 lb 1.6 oz (67.178 kg)  05/31/13 148 lb (67.132 kg)  04/20/13 144 lb (65.318 kg)     General appearance: Well-nourished Caucasian woman HENNT: Pharynx no erythema, exudate, mass, or ulcer. No thyromegaly or thyroid nodules Lymph nodes: No cervical, supraclavicular, or axillary lymphadenopathy Breasts:  Lungs: Clear to auscultation, resonant to percussion throughout Heart: Regular rhythm, no murmur, no gallop, no rub, no click, no edema Abdomen: Soft, nontender, normal bowel sounds, no mass, no organomegaly Extremities: No edema, no calf tenderness Musculoskeletal: Osteoarthritic changes of the hands GU:  Vascular: Carotid pulses 2+, no bruits,  Neurologic: Alert, oriented, PERRLA, optic discs sharp and vessels normal, no hemorrhage or exudate, cranial nerves grossly normal, motor strength 5 over 5, reflexes 1+ symmetric, upper body coordination normal, gait normal, Skin: No rash or ecchymosis  Lab Results: CBC W/Diff  [place] count differential: 74% neutrophils, 18% lymphocytes, 5% monocytes, 2% eosinophils, 1% basophils.   Component Value Date/Time   WBC 7.9 11/02/2013 1043   WBC 15.2* 07/09/2012 0355   RBC 3.84 11/02/2013 1043   RBC 3.35* 07/09/2012 0355   HGB 10.8* 11/02/2013 1043   HGB 9.5* 07/09/2012 0355   HCT 32.8* 11/02/2013 1043   HCT 28.9* 07/09/2012 0355   PLT  243 11/02/2013 1043   PLT 271 07/09/2012 0355   MCV 85.4 11/02/2013 1043   MCV 86.3 07/09/2012 0355   MCH 28.1 11/02/2013 1043   MCH 28.4 07/09/2012 0355   MCHC 32.9 11/02/2013 1043   MCHC 32.9 07/09/2012 0355   RDW 17.3* 11/02/2013 1043   RDW 17.8* 07/09/2012 0355   LYMPHSABS 1.4  11/02/2013 1043   LYMPHSABS 0.5* 07/08/2012 0030   MONOABS 0.4 11/02/2013 1043   MONOABS 1.5* 07/08/2012 0030   EOSABS 0.2 11/02/2013 1043   EOSABS 0.1 07/08/2012 0030   BASOSABS 0.1 11/02/2013 1043   BASOSABS 0.1 07/08/2012 0030     Chemistry      Component Value Date/Time   NA 141 04/20/2013 1320   NA 138 07/09/2012 0355   K 4.4 04/20/2013 1320   K 4.0 07/09/2012 0355   CL 109* 04/20/2013 1320   CL 102 07/09/2012 0355   CO2 24 04/20/2013 1320   CO2 22 07/09/2012 0355   BUN 27.4* 04/20/2013 1320   BUN 31* 07/09/2012 0355   CREATININE 1.1 04/20/2013 1320   CREATININE 1.09 07/09/2012 0355      Component Value Date/Time   CALCIUM 9.1 04/20/2013 1320   CALCIUM 9.2 07/09/2012 0355   ALKPHOS 74 04/20/2013 1320   ALKPHOS 70 02/04/2012 1042   AST 14 04/20/2013 1320   AST 19 02/04/2012 1042   ALT 13 04/20/2013 1320   ALT 24 02/04/2012 1042   BILITOT 0.85 04/20/2013 1320   BILITOT 0.6 02/04/2012 1042         Impression:  #1. Myeloproliferative disorder with dysplastic features.  Blood counts are stable with Aranesp support.  Plan: Continue Q4 weekly Aranesp for hemoglobin less than or equal to 10 g   I went through the exact same discussion with her husband that we had at time of her visit here last year and again reviewed the diagnosis, prognosis, and treatment options for myelodysplastic syndromes. I feel she is at very low risk for leukemic conversion given the stability of her blood disorder over time and initial bone marrow findings of only 1% blasts a normal cytogenetic studies.  #2. Hypothyroid on replacement   #3.surgery for rectal prolapse 2013 [facility]    CC: Patient Care Team: Brittany Pick, MD as PCP - General (Internal Medicine) Brittany Banister, MD as Consulting Physician (Gastroenterology)   Brittany Belt, MD 11/23/20142:06 PM

## 2013-11-13 ENCOUNTER — Other Ambulatory Visit: Payer: Self-pay | Admitting: Internal Medicine

## 2013-11-15 LAB — HM DIABETES EYE EXAM

## 2013-11-30 ENCOUNTER — Encounter: Payer: Self-pay | Admitting: Internal Medicine

## 2013-11-30 ENCOUNTER — Ambulatory Visit: Payer: Medicare Other

## 2013-11-30 ENCOUNTER — Ambulatory Visit (INDEPENDENT_AMBULATORY_CARE_PROVIDER_SITE_OTHER): Payer: Medicare Other | Admitting: Internal Medicine

## 2013-11-30 ENCOUNTER — Other Ambulatory Visit (HOSPITAL_BASED_OUTPATIENT_CLINIC_OR_DEPARTMENT_OTHER): Payer: Medicare Other

## 2013-11-30 ENCOUNTER — Other Ambulatory Visit: Payer: Medicare Other

## 2013-11-30 VITALS — BP 124/70 | HR 92 | Temp 98.0°F | Ht 59.0 in | Wt 147.0 lb

## 2013-11-30 DIAGNOSIS — E1159 Type 2 diabetes mellitus with other circulatory complications: Secondary | ICD-10-CM

## 2013-11-30 DIAGNOSIS — D47Z9 Other specified neoplasms of uncertain behavior of lymphoid, hematopoietic and related tissue: Secondary | ICD-10-CM

## 2013-11-30 DIAGNOSIS — I10 Essential (primary) hypertension: Secondary | ICD-10-CM | POA: Diagnosis not present

## 2013-11-30 DIAGNOSIS — I639 Cerebral infarction, unspecified: Secondary | ICD-10-CM

## 2013-11-30 DIAGNOSIS — D469 Myelodysplastic syndrome, unspecified: Secondary | ICD-10-CM

## 2013-11-30 DIAGNOSIS — I6350 Cerebral infarction due to unspecified occlusion or stenosis of unspecified cerebral artery: Secondary | ICD-10-CM

## 2013-11-30 DIAGNOSIS — E119 Type 2 diabetes mellitus without complications: Secondary | ICD-10-CM

## 2013-11-30 DIAGNOSIS — D471 Chronic myeloproliferative disease: Secondary | ICD-10-CM

## 2013-11-30 DIAGNOSIS — E039 Hypothyroidism, unspecified: Secondary | ICD-10-CM

## 2013-11-30 LAB — CBC WITH DIFFERENTIAL/PLATELET
BASO%: 1.4 % (ref 0.0–2.0)
Basophils Absolute: 0.1 10*3/uL (ref 0.0–0.1)
Eosinophils Absolute: 0.2 10*3/uL (ref 0.0–0.5)
HCT: 34.6 % — ABNORMAL LOW (ref 34.8–46.6)
HGB: 11.6 g/dL (ref 11.6–15.9)
MCHC: 33.7 g/dL (ref 31.5–36.0)
MONO#: 0.6 10*3/uL (ref 0.1–0.9)
NEUT#: 6 10*3/uL (ref 1.5–6.5)
NEUT%: 68.3 % (ref 38.4–76.8)
Platelets: 226 10*3/uL (ref 145–400)
WBC: 8.8 10*3/uL (ref 3.9–10.3)
lymph#: 1.9 10*3/uL (ref 0.9–3.3)

## 2013-11-30 LAB — LIPID PANEL: Cholesterol: 201 mg/dL — ABNORMAL HIGH (ref 0–200)

## 2013-11-30 LAB — HEPATIC FUNCTION PANEL
ALT: 19 U/L (ref 0–35)
AST: 20 U/L (ref 0–37)
Alkaline Phosphatase: 67 U/L (ref 39–117)
Bilirubin, Direct: 0.1 mg/dL (ref 0.0–0.3)
Total Bilirubin: 0.9 mg/dL (ref 0.3–1.2)

## 2013-11-30 LAB — HEMOGLOBIN A1C: Hgb A1c MFr Bld: 5.1 % (ref 4.6–6.5)

## 2013-11-30 LAB — BASIC METABOLIC PANEL
BUN: 34 mg/dL — ABNORMAL HIGH (ref 6–23)
Calcium: 9.6 mg/dL (ref 8.4–10.5)
Creatinine, Ser: 1.1 mg/dL (ref 0.4–1.2)
GFR: 51.49 mL/min — ABNORMAL LOW (ref >60.00)
Glucose, Bld: 87 mg/dL (ref 70–99)

## 2013-11-30 LAB — MICROALBUMIN / CREATININE URINE RATIO
Creatinine,U: 205.3 mg/dL
Microalb Creat Ratio: 0.7 mg/g (ref 0.0–30.0)

## 2013-11-30 MED ORDER — DARBEPOETIN ALFA-POLYSORBATE 300 MCG/0.6ML IJ SOLN: 300.0000 ug | Freq: Once | INTRAMUSCULAR | Status: DC

## 2013-11-30 NOTE — Progress Notes (Signed)
Hypertension- bp meds dc'd due to low blood pressure.  MDS- sees dr. Joycelyn Man q w weeks for hgb<10g/dl  Hypothyorid: needs f/u Lab Results  Component Value Date   TSH 0.77 04/23/2011   Memory: takes aricept and is doing quite well  Past Medical History  Diagnosis Date  . Melanoma     x2  . Diabetes mellitus   . Diverticulosis of colon   . Hyperlipidemia   . Hypothyroidism   . Osteoporosis   . Blood transfusion 2006  . Myeloproliferative disorder 02/04/2012  . Normochromic normocytic anemia 02/04/2012  . Hypertension   . Osteopenia     History   Social History  . Marital Status: Married    Spouse Name: N/A    Number of Children: N/A  . Years of Education: N/A   Occupational History  . Not on file.   Social History Main Topics  . Smoking status: Never Smoker   . Smokeless tobacco: Never Used  . Alcohol Use: No  . Drug Use: No  . Sexual Activity: Yes    Birth Control/ Protection: Surgical     Comment: HYST   Other Topics Concern  . Not on file   Social History Narrative  . No narrative on file    Past Surgical History  Procedure Laterality Date  . Polypectomy      Laparoscopic  . Appendectomy    . Carpal tunnel release    . Melanoma excision    . Replacement total knee    . Rectocele repair    . Vaginal hysterectomy    . Flexible sigmoidoscopy  07/09/2012    Procedure: FLEXIBLE SIGMOIDOSCOPY;  Surgeon: Inda Castle, MD;  Location: [facility];  Service: Endoscopy;  Laterality: N/A;    Family History  Problem Relation Age of Onset  . Prostate cancer Brother   . Cancer Brother     renal cell carcinoma  . Heart disease Brother   . Breast cancer Mother 55  . Cancer Father     unknown kind    Allergies  Allergen Reactions  . Lisinopril     REACTION: angioedema  . Penicillins     REACTION: rash    Current Outpatient Prescriptions on File Prior to Visit  Medication Sig Dispense Refill  . acetaminophen (TYLENOL) 500 MG tablet  500 mg 3 times daily  30 tablet    . Ascorbic Acid (VITAMIN C) 500 MG tablet Take 500 mg by mouth daily.        Marland Kitchen aspirin EC 81 MG tablet Take 81 mg by mouth daily.      . Calcium Carbonate-Vit D-Min (CALTRATE 600+D PLUS) 600-400 MG-UNIT per tablet Take 1 tablet by mouth daily.        . Cholecalciferol (VITAMIN D-3 PO) Take by mouth daily.      . cycloSPORINE (RESTASIS) 0.05 % ophthalmic emulsion Place 1 drop into both eyes 2 (two) times daily.        Marland Kitchen donepezil (ARICEPT) 5 MG tablet 1 tablet daily      . iron polysaccharides (NIFEREX) 150 MG capsule Take 150 mg by mouth 2 (two) times daily.       Marland Kitchen levothyroxine (SYNTHROID, LEVOTHROID) 88 MCG tablet TAKE 1 TABLET BY MOUTH EVERY DAY  90 tablet  0  . Memantine HCl ER (NAMENDA XR) 28 MG CP24 Take 28 mg by mouth daily.  30 capsule  6  . Multiple Vitamin (MULTIVITAMIN WITH MINERALS) TABS Take 1 tablet by mouth daily.      Marland Kitchen  polyethylene glycol powder (GLYCOLAX/MIRALAX) powder Take 17 g by mouth daily as needed.      . traMADol (ULTRAM) 50 MG tablet TAKE 1 TABLET TWICE A DAY AS NEEDED FOR PAIN  40 tablet  1   No current facility-administered medications on file prior to visit.     patient denies chest pain, shortness of breath, orthopnea. Denies lower extremity edema, abdominal pain, change in appetite, change in bowel movements. Patient denies rashes, musculoskeletal complaints. No other specific complaints in a complete review of systems.   BP 124/70  Pulse 92  Temp(Src) 98 F (36.7 C) (Oral)  Ht 4\' 11"  (1.499 m)  Wt 147 lb (66.679 kg)  BMI 29.67 kg/m2  Well-developed well-nourished female in no acute distress. HEENT exam atraumatic, normocephalic, extraocular muscles are intact. Neck is supple. No jugular venous distention no thyromegaly. Chest clear to auscultation without increased work of breathing. Cardiac exam S1 and S2 are regular. Abdominal exam active bowel sounds, soft, nontender. Extremities no edema. Neurologic exam she is alert  without any motor sensory deficits. Gait is normal.  HYPERTENSION BP Readings from Last 3 Encounters:  11/30/13 124/70  11/02/13 146/57  10/05/13 129/54    Continue same meds  Stroke Has had no recurrence Requested information from [place], TYPE II Lab Results  Component Value Date   HGBA1C 5.1 11/30/2013  no meds at this time

## 2013-11-30 NOTE — Progress Notes (Signed)
Pre visit review using our clinic review tool, if applicable. No additional management support is needed unless otherwise documented below in the visit note. 

## 2013-12-02 ENCOUNTER — Other Ambulatory Visit: Payer: Self-pay | Admitting: Internal Medicine

## 2013-12-02 NOTE — Assessment & Plan Note (Signed)
Lab Results  Component Value Date   HGBA1C 5.1 11/30/2013  no meds at this time

## 2013-12-02 NOTE — Assessment & Plan Note (Signed)
Has had no recurrence Requested information from [name]

## 2013-12-02 NOTE — Assessment & Plan Note (Signed)
Lab Results  Component Value Date   TSH 1.96 11/30/2013  continue same meds

## 2013-12-02 NOTE — Assessment & Plan Note (Signed)
BP Readings from Last 3 Encounters:  11/30/13 124/70  11/02/13 146/57  10/05/13 129/54    Continue same meds

## 2013-12-17 ENCOUNTER — Other Ambulatory Visit: Payer: Self-pay | Admitting: Internal Medicine

## 2013-12-28 ENCOUNTER — Ambulatory Visit: Payer: Medicare Other

## 2013-12-28 ENCOUNTER — Other Ambulatory Visit (HOSPITAL_BASED_OUTPATIENT_CLINIC_OR_DEPARTMENT_OTHER): Payer: Medicare Other

## 2013-12-28 DIAGNOSIS — D469 Myelodysplastic syndrome, unspecified: Secondary | ICD-10-CM

## 2013-12-28 DIAGNOSIS — D47Z9 Other specified neoplasms of uncertain behavior of lymphoid, hematopoietic and related tissue: Secondary | ICD-10-CM | POA: Diagnosis not present

## 2013-12-28 DIAGNOSIS — D471 Chronic myeloproliferative disease: Secondary | ICD-10-CM

## 2013-12-28 LAB — CBC WITH DIFFERENTIAL/PLATELET
BASO%: 1.4 % (ref 0.0–2.0)
BASOS ABS: 0.1 10*3/uL (ref 0.0–0.1)
EOS%: 2.3 % (ref 0.0–7.0)
Eosinophils Absolute: 0.2 10*3/uL (ref 0.0–0.5)
HCT: 30.6 % — ABNORMAL LOW (ref 34.8–46.6)
HEMOGLOBIN: 10.6 g/dL — AB (ref 11.6–15.9)
LYMPH%: 19.8 % (ref 14.0–49.7)
MCH: 29.8 pg (ref 25.1–34.0)
MCHC: 34.6 g/dL (ref 31.5–36.0)
MCV: 86 fL (ref 79.5–101.0)
MONO#: 0.5 10*3/uL (ref 0.1–0.9)
MONO%: 7.5 % (ref 0.0–14.0)
NEUT#: 5 10*3/uL (ref 1.5–6.5)
NEUT%: 69 % (ref 38.4–76.8)
Platelets: 231 10*3/uL (ref 145–400)
RBC: 3.56 10*6/uL — AB (ref 3.70–5.45)
RDW: 16.9 % — AB (ref 11.2–14.5)
WBC: 7.3 10*3/uL (ref 3.9–10.3)
lymph#: 1.4 10*3/uL (ref 0.9–3.3)

## 2013-12-28 MED ORDER — DARBEPOETIN ALFA-POLYSORBATE 300 MCG/0.6ML IJ SOLN
300.0000 ug | Freq: Once | INTRAMUSCULAR | Status: DC
  Filled 2013-12-28: qty 0.6

## 2014-01-11 ENCOUNTER — Other Ambulatory Visit: Payer: Self-pay | Admitting: Internal Medicine

## 2014-01-25 ENCOUNTER — Other Ambulatory Visit (HOSPITAL_BASED_OUTPATIENT_CLINIC_OR_DEPARTMENT_OTHER): Payer: Medicare Other

## 2014-01-25 ENCOUNTER — Ambulatory Visit: Payer: Medicare Other

## 2014-01-25 DIAGNOSIS — D471 Chronic myeloproliferative disease: Secondary | ICD-10-CM

## 2014-01-25 DIAGNOSIS — D469 Myelodysplastic syndrome, unspecified: Secondary | ICD-10-CM

## 2014-01-25 LAB — CBC WITH DIFFERENTIAL/PLATELET
BASO%: 0.4 % (ref 0.0–2.0)
BASOS ABS: 0 10*3/uL (ref 0.0–0.1)
EOS ABS: 0.2 10*3/uL (ref 0.0–0.5)
EOS%: 2.2 % (ref 0.0–7.0)
HCT: 30.5 % — ABNORMAL LOW (ref 34.8–46.6)
HEMOGLOBIN: 10.1 g/dL — AB (ref 11.6–15.9)
LYMPH#: 1.6 10*3/uL (ref 0.9–3.3)
LYMPH%: 21.9 % (ref 14.0–49.7)
MCH: 28.6 pg (ref 25.1–34.0)
MCHC: 33.1 g/dL (ref 31.5–36.0)
MCV: 86.4 fL (ref 79.5–101.0)
MONO#: 0.5 10*3/uL (ref 0.1–0.9)
MONO%: 6.7 % (ref 0.0–14.0)
NEUT%: 68.8 % (ref 38.4–76.8)
NEUTROS ABS: 5.1 10*3/uL (ref 1.5–6.5)
Platelets: 234 10*3/uL (ref 145–400)
RBC: 3.53 10*6/uL — ABNORMAL LOW (ref 3.70–5.45)
RDW: 16.8 % — AB (ref 11.2–14.5)
WBC: 7.4 10*3/uL (ref 3.9–10.3)

## 2014-01-25 MED ORDER — DARBEPOETIN ALFA-POLYSORBATE 300 MCG/0.6ML IJ SOLN
300.0000 ug | Freq: Once | INTRAMUSCULAR | Status: DC
  Filled 2014-01-25: qty 0.6

## 2014-02-14 ENCOUNTER — Encounter: Payer: Self-pay | Admitting: Oncology

## 2014-02-18 ENCOUNTER — Telehealth: Payer: Self-pay | Admitting: Internal Medicine

## 2014-02-18 NOTE — Telephone Encounter (Signed)
Patient Information:  Caller Name: Iona Beard  Phone: [phone]  Patient: Brittany [name]  Gender: Female  DOB: 1924-07-18  Age: 80 Years  PCP: Phoebe Sharps (Adults only)  Office Follow Up:  Does the office need to follow up with this patient?: No  Instructions For The Office: N/A  RN Note:  Caller verbalized understanding of care advice and call back parameters.  Symptoms  Reason For Call & Symptoms: Dry cough, nasal congestion.  Reviewed Health History In EMR: Yes  Reviewed Medications In EMR: Yes  Reviewed Allergies In EMR: Yes  Reviewed Surgeries / Procedures: Yes  Date of Onset of Symptoms: 02/11/2014  Guideline(s) Used:  Cough  Disposition Per Guideline:   Home Care  Reason For Disposition Reached:   Cough with cold symptoms (e.g., runny nose, postnasal drip, throat clearing)  Advice Given:  Reassurance  Coughing is the way that our lungs remove irritants and mucus. It helps protect our lungs from getting pneumonia.  Here is some care advice that should help.  Cough Medicines:  OTC Cough Syrups: The most common cough suppressant in OTC cough medications is dextromethorphan. Often the letters "DM" appear in the name.  Caution - Dextromethorphan:   Do not try to completely suppress coughs that produce mucus and phlegm. Remember that coughing is helpful in bringing up mucus from the lungs and preventing pneumonia.  Expected Course:   Viral bronchitis (chest cold) causes a cough that lasts 1 to 3 weeks. Sometimes you may cough up lots of phlegm (sputum, mucus). The mucus can normally be Demirjian, gray, yellow, or green.  Call Back If:  Difficulty breathing  Cough lasts more than 3 weeks  You become worse.  Patient Will Follow Care Advice:  YES

## 2014-02-22 ENCOUNTER — Ambulatory Visit (HOSPITAL_BASED_OUTPATIENT_CLINIC_OR_DEPARTMENT_OTHER): Payer: Medicare Other

## 2014-02-22 ENCOUNTER — Encounter (INDEPENDENT_AMBULATORY_CARE_PROVIDER_SITE_OTHER): Payer: Self-pay

## 2014-02-22 ENCOUNTER — Other Ambulatory Visit (HOSPITAL_BASED_OUTPATIENT_CLINIC_OR_DEPARTMENT_OTHER): Payer: Medicare Other

## 2014-02-22 VITALS — BP 114/48 | HR 84 | Temp 97.6°F

## 2014-02-22 DIAGNOSIS — D471 Chronic myeloproliferative disease: Secondary | ICD-10-CM

## 2014-02-22 DIAGNOSIS — D47Z9 Other specified neoplasms of uncertain behavior of lymphoid, hematopoietic and related tissue: Secondary | ICD-10-CM

## 2014-02-22 DIAGNOSIS — D469 Myelodysplastic syndrome, unspecified: Secondary | ICD-10-CM

## 2014-02-22 LAB — CBC WITH DIFFERENTIAL/PLATELET
BASO%: 1.2 % (ref 0.0–2.0)
BASOS ABS: 0.1 10*3/uL (ref 0.0–0.1)
EOS%: 2.6 % (ref 0.0–7.0)
Eosinophils Absolute: 0.2 10*3/uL (ref 0.0–0.5)
HEMATOCRIT: 29 % — AB (ref 34.8–46.6)
HEMOGLOBIN: 9.8 g/dL — AB (ref 11.6–15.9)
LYMPH%: 24.4 % (ref 14.0–49.7)
MCH: 29 pg (ref 25.1–34.0)
MCHC: 33.6 g/dL (ref 31.5–36.0)
MCV: 86.2 fL (ref 79.5–101.0)
MONO#: 0.5 10*3/uL (ref 0.1–0.9)
MONO%: 6.1 % (ref 0.0–14.0)
NEUT#: 5.9 10*3/uL (ref 1.5–6.5)
NEUT%: 65.7 % (ref 38.4–76.8)
PLATELETS: 316 10*3/uL (ref 145–400)
RBC: 3.37 10*6/uL — ABNORMAL LOW (ref 3.70–5.45)
RDW: 16.1 % — ABNORMAL HIGH (ref 11.2–14.5)
WBC: 9 10*3/uL (ref 3.9–10.3)
lymph#: 2.2 10*3/uL (ref 0.9–3.3)

## 2014-02-22 MED ORDER — DARBEPOETIN ALFA-POLYSORBATE 300 MCG/0.6ML IJ SOLN
300.0000 ug | Freq: Once | INTRAMUSCULAR | Status: AC
  Administered 2014-02-22: 300 ug via SUBCUTANEOUS
  Filled 2014-02-22: qty 0.6

## 2014-03-15 ENCOUNTER — Other Ambulatory Visit: Payer: Self-pay | Admitting: Internal Medicine

## 2014-03-22 ENCOUNTER — Other Ambulatory Visit (HOSPITAL_BASED_OUTPATIENT_CLINIC_OR_DEPARTMENT_OTHER): Payer: Medicare Other

## 2014-03-22 ENCOUNTER — Ambulatory Visit: Payer: Medicare Other

## 2014-03-22 DIAGNOSIS — D469 Myelodysplastic syndrome, unspecified: Secondary | ICD-10-CM

## 2014-03-22 DIAGNOSIS — D47Z9 Other specified neoplasms of uncertain behavior of lymphoid, hematopoietic and related tissue: Secondary | ICD-10-CM

## 2014-03-22 DIAGNOSIS — D471 Chronic myeloproliferative disease: Secondary | ICD-10-CM

## 2014-03-22 LAB — CBC WITH DIFFERENTIAL/PLATELET
BASO%: 1.4 % (ref 0.0–2.0)
Basophils Absolute: 0.1 10*3/uL (ref 0.0–0.1)
EOS ABS: 0.2 10*3/uL (ref 0.0–0.5)
EOS%: 2.9 % (ref 0.0–7.0)
HCT: 31.5 % — ABNORMAL LOW (ref 34.8–46.6)
HGB: 10.4 g/dL — ABNORMAL LOW (ref 11.6–15.9)
LYMPH%: 20.8 % (ref 14.0–49.7)
MCH: 28.9 pg (ref 25.1–34.0)
MCHC: 33.2 g/dL (ref 31.5–36.0)
MCV: 87.2 fL (ref 79.5–101.0)
MONO#: 0.5 10*3/uL (ref 0.1–0.9)
MONO%: 7.3 % (ref 0.0–14.0)
NEUT#: 4.2 10*3/uL (ref 1.5–6.5)
NEUT%: 67.6 % (ref 38.4–76.8)
PLATELETS: 279 10*3/uL (ref 145–400)
RBC: 3.61 10*6/uL — AB (ref 3.70–5.45)
RDW: 17 % — ABNORMAL HIGH (ref 11.2–14.5)
WBC: 6.2 10*3/uL (ref 3.9–10.3)
lymph#: 1.3 10*3/uL (ref 0.9–3.3)

## 2014-03-22 MED ORDER — DARBEPOETIN ALFA-POLYSORBATE 300 MCG/0.6ML IJ SOLN
300.0000 ug | Freq: Once | INTRAMUSCULAR | Status: DC
  Filled 2014-03-22: qty 0.6

## 2014-03-23 ENCOUNTER — Other Ambulatory Visit: Payer: Self-pay | Admitting: Oncology

## 2014-03-23 ENCOUNTER — Telehealth: Payer: Self-pay | Admitting: *Deleted

## 2014-03-23 DIAGNOSIS — D471 Chronic myeloproliferative disease: Secondary | ICD-10-CM

## 2014-03-23 DIAGNOSIS — D469 Myelodysplastic syndrome, unspecified: Secondary | ICD-10-CM

## 2014-03-23 NOTE — Telephone Encounter (Signed)
Message copied by Jesse Fall on Wed Mar 23, 2014  5:10 PM ------      Message from: Annia Belt      Created: Wed Mar 23, 2014  5:00 PM       Call pt counts stable.  Does she have a follow up appt with anyone? ------

## 2014-03-23 NOTE — Telephone Encounter (Signed)
Notified pt of stable counts per Dr Beryle Beams.  She is not scheduled for f/u.  Informed to call [facility] if she doesn't hear anything from them in a month.

## 2014-03-25 ENCOUNTER — Telehealth: Payer: Self-pay | Admitting: Oncology

## 2014-03-25 NOTE — Telephone Encounter (Signed)
added additional lb/inj appts for pt 6/30 thru dec 2015 per 4/8 pof sent by JG. pt to f/u @ [facility] and will receive a call re appt. pt to get new schedule when she comes in.

## 2014-04-19 ENCOUNTER — Other Ambulatory Visit (HOSPITAL_BASED_OUTPATIENT_CLINIC_OR_DEPARTMENT_OTHER): Payer: Medicare Other

## 2014-04-19 ENCOUNTER — Ambulatory Visit: Payer: Medicare Other | Admitting: Nurse Practitioner

## 2014-04-19 ENCOUNTER — Ambulatory Visit: Payer: Medicare Other

## 2014-04-19 DIAGNOSIS — D471 Chronic myeloproliferative disease: Secondary | ICD-10-CM

## 2014-04-19 DIAGNOSIS — D469 Myelodysplastic syndrome, unspecified: Secondary | ICD-10-CM

## 2014-04-19 LAB — CBC WITH DIFFERENTIAL/PLATELET
BASO%: 1.4 % (ref 0.0–2.0)
Basophils Absolute: 0.1 10*3/uL (ref 0.0–0.1)
EOS%: 3.2 % (ref 0.0–7.0)
Eosinophils Absolute: 0.2 10*3/uL (ref 0.0–0.5)
HEMATOCRIT: 31.9 % — AB (ref 34.8–46.6)
HGB: 10.7 g/dL — ABNORMAL LOW (ref 11.6–15.9)
LYMPH%: 25.7 % (ref 14.0–49.7)
MCH: 28.8 pg (ref 25.1–34.0)
MCHC: 33.7 g/dL (ref 31.5–36.0)
MCV: 85.5 fL (ref 79.5–101.0)
MONO#: 0.5 10*3/uL (ref 0.1–0.9)
MONO%: 7.6 % (ref 0.0–14.0)
NEUT#: 4 10*3/uL (ref 1.5–6.5)
NEUT%: 62.1 % (ref 38.4–76.8)
Platelets: 244 10*3/uL (ref 145–400)
RBC: 3.73 10*6/uL (ref 3.70–5.45)
RDW: 16.4 % — ABNORMAL HIGH (ref 11.2–14.5)
WBC: 6.5 10*3/uL (ref 3.9–10.3)
lymph#: 1.7 10*3/uL (ref 0.9–3.3)

## 2014-04-19 MED ORDER — DARBEPOETIN ALFA-POLYSORBATE 300 MCG/0.6ML IJ SOLN: 300.0000 ug | Freq: Once | INTRAMUSCULAR | Status: DC

## 2014-04-20 ENCOUNTER — Telehealth: Payer: Self-pay | Admitting: *Deleted

## 2014-04-20 NOTE — Telephone Encounter (Signed)
Message copied by Ignacia Felling on Wed Apr 20, 2014  2:48 PM ------      Message from: Annia Belt      Created: Wed Apr 20, 2014  1:10 PM       Call pt: hemoglobin stable.  I do not see that she has a scheduled MD follow up - please check ------

## 2014-04-20 NOTE — Telephone Encounter (Signed)
Spoke with patient.  Let her know that hemoglobin is stable.  As for her appts.  She saw Dr. Beryle Beams 11-02-13 and at that time he did a POF to see Ned Card NP on 05-03-14.  She is on Dr. Azucena Freed list to be seen at [facility].  However, no appt. With Lattie Haw or Dr. Beryle Beams is seen in the computer.  Discussed with patient and let her know to expect call from scheduler to get her an appt.

## 2014-04-21 DIAGNOSIS — T1510XA Foreign body in conjunctival sac, unspecified eye, initial encounter: Secondary | ICD-10-CM | POA: Diagnosis not present

## 2014-04-21 DIAGNOSIS — H2510 Age-related nuclear cataract, unspecified eye: Secondary | ICD-10-CM | POA: Diagnosis not present

## 2014-05-03 ENCOUNTER — Ambulatory Visit: Payer: Medicare Other | Admitting: Nurse Practitioner

## 2014-05-16 DIAGNOSIS — F09 Unspecified mental disorder due to known physiological condition: Secondary | ICD-10-CM | POA: Diagnosis not present

## 2014-05-16 DIAGNOSIS — E039 Hypothyroidism, unspecified: Secondary | ICD-10-CM | POA: Diagnosis not present

## 2014-05-16 DIAGNOSIS — R413 Other amnesia: Secondary | ICD-10-CM | POA: Diagnosis not present

## 2014-05-16 DIAGNOSIS — I10 Essential (primary) hypertension: Secondary | ICD-10-CM | POA: Diagnosis not present

## 2014-05-16 DIAGNOSIS — E119 Type 2 diabetes mellitus without complications: Secondary | ICD-10-CM | POA: Diagnosis not present

## 2014-05-17 ENCOUNTER — Other Ambulatory Visit (HOSPITAL_BASED_OUTPATIENT_CLINIC_OR_DEPARTMENT_OTHER): Payer: Medicare Other

## 2014-05-17 ENCOUNTER — Ambulatory Visit (HOSPITAL_BASED_OUTPATIENT_CLINIC_OR_DEPARTMENT_OTHER): Payer: Medicare Other

## 2014-05-17 VITALS — BP 137/52 | HR 81 | Temp 97.8°F

## 2014-05-17 DIAGNOSIS — D471 Chronic myeloproliferative disease: Secondary | ICD-10-CM

## 2014-05-17 DIAGNOSIS — D47Z9 Other specified neoplasms of uncertain behavior of lymphoid, hematopoietic and related tissue: Secondary | ICD-10-CM

## 2014-05-17 DIAGNOSIS — D469 Myelodysplastic syndrome, unspecified: Secondary | ICD-10-CM

## 2014-05-17 LAB — CBC WITH DIFFERENTIAL/PLATELET
BASO%: 1.4 % (ref 0.0–2.0)
Basophils Absolute: 0.1 10*3/uL (ref 0.0–0.1)
EOS ABS: 0.2 10*3/uL (ref 0.0–0.5)
EOS%: 2.6 % (ref 0.0–7.0)
HEMATOCRIT: 30.9 % — AB (ref 34.8–46.6)
HGB: 10.4 g/dL — ABNORMAL LOW (ref 11.6–15.9)
LYMPH#: 1.5 10*3/uL (ref 0.9–3.3)
LYMPH%: 22.7 % (ref 14.0–49.7)
MCH: 28.7 pg (ref 25.1–34.0)
MCHC: 33.6 g/dL (ref 31.5–36.0)
MCV: 85.4 fL (ref 79.5–101.0)
MONO#: 0.5 10*3/uL (ref 0.1–0.9)
MONO%: 7.9 % (ref 0.0–14.0)
NEUT%: 65.4 % (ref 38.4–76.8)
NEUTROS ABS: 4.4 10*3/uL (ref 1.5–6.5)
Platelets: 227 10*3/uL (ref 145–400)
RBC: 3.62 10*6/uL — AB (ref 3.70–5.45)
RDW: 16.3 % — ABNORMAL HIGH (ref 11.2–14.5)
WBC: 6.7 10*3/uL (ref 3.9–10.3)

## 2014-05-17 MED ORDER — DARBEPOETIN ALFA-POLYSORBATE 300 MCG/0.6ML IJ SOLN
300.0000 ug | Freq: Once | INTRAMUSCULAR | Status: AC
  Administered 2014-05-17: 300 ug via SUBCUTANEOUS
  Filled 2014-05-17: qty 0.6

## 2014-05-31 DIAGNOSIS — L570 Actinic keratosis: Secondary | ICD-10-CM | POA: Diagnosis not present

## 2014-05-31 DIAGNOSIS — D485 Neoplasm of uncertain behavior of skin: Secondary | ICD-10-CM | POA: Diagnosis not present

## 2014-05-31 DIAGNOSIS — D239 Other benign neoplasm of skin, unspecified: Secondary | ICD-10-CM | POA: Diagnosis not present

## 2014-05-31 DIAGNOSIS — Z85820 Personal history of malignant melanoma of skin: Secondary | ICD-10-CM | POA: Diagnosis not present

## 2014-05-31 DIAGNOSIS — C44319 Basal cell carcinoma of skin of other parts of face: Secondary | ICD-10-CM | POA: Diagnosis not present

## 2014-06-06 ENCOUNTER — Other Ambulatory Visit: Payer: Self-pay | Admitting: Internal Medicine

## 2014-06-08 ENCOUNTER — Other Ambulatory Visit: Payer: Self-pay | Admitting: Internal Medicine

## 2014-06-09 ENCOUNTER — Other Ambulatory Visit (HOSPITAL_BASED_OUTPATIENT_CLINIC_OR_DEPARTMENT_OTHER): Payer: Medicare Other

## 2014-06-09 ENCOUNTER — Ambulatory Visit: Payer: Medicare Other

## 2014-06-09 ENCOUNTER — Telehealth: Payer: Self-pay | Admitting: Oncology

## 2014-06-09 DIAGNOSIS — D469 Myelodysplastic syndrome, unspecified: Secondary | ICD-10-CM

## 2014-06-09 DIAGNOSIS — D471 Chronic myeloproliferative disease: Secondary | ICD-10-CM

## 2014-06-09 DIAGNOSIS — D47Z9 Other specified neoplasms of uncertain behavior of lymphoid, hematopoietic and related tissue: Secondary | ICD-10-CM | POA: Diagnosis not present

## 2014-06-09 LAB — CBC WITH DIFFERENTIAL/PLATELET
BASO%: 1.4 % (ref 0.0–2.0)
BASOS ABS: 0.1 10*3/uL (ref 0.0–0.1)
EOS ABS: 0.2 10*3/uL (ref 0.0–0.5)
EOS%: 2.4 % (ref 0.0–7.0)
HEMATOCRIT: 36.1 % (ref 34.8–46.6)
HEMOGLOBIN: 11.8 g/dL (ref 11.6–15.9)
LYMPH#: 1.4 10*3/uL (ref 0.9–3.3)
LYMPH%: 19.2 % (ref 14.0–49.7)
MCH: 28.6 pg (ref 25.1–34.0)
MCHC: 32.8 g/dL (ref 31.5–36.0)
MCV: 87.1 fL (ref 79.5–101.0)
MONO#: 0.4 10*3/uL (ref 0.1–0.9)
MONO%: 5.9 % (ref 0.0–14.0)
NEUT#: 5.1 10*3/uL (ref 1.5–6.5)
NEUT%: 71.1 % (ref 38.4–76.8)
Platelets: 282 10*3/uL (ref 145–400)
RBC: 4.14 10*6/uL (ref 3.70–5.45)
RDW: 17.2 % — ABNORMAL HIGH (ref 11.2–14.5)
WBC: 7.2 10*3/uL (ref 3.9–10.3)

## 2014-06-09 MED ORDER — DARBEPOETIN ALFA-POLYSORBATE 300 MCG/0.6ML IJ SOLN: 300.0000 ug | Freq: Once | INTRAMUSCULAR | Status: DC

## 2014-06-09 NOTE — Telephone Encounter (Signed)
Gave pt appt for lab and injections for 8/3 r/s from 7/28 per pt rqst , pt needs MD appt , does not want to follow Dr Beryle Beams to [facility], emailed Dr Alvy Bimler for request

## 2014-06-10 ENCOUNTER — Telehealth: Payer: Self-pay | Admitting: Hematology and Oncology

## 2014-06-10 NOTE — Telephone Encounter (Signed)
Talked to pt and  gave her appt for for 07/18/14 lab,MD and injections for 8/3

## 2014-06-14 ENCOUNTER — Ambulatory Visit: Payer: Medicare Other

## 2014-06-14 ENCOUNTER — Other Ambulatory Visit: Payer: Medicare Other

## 2014-06-20 DIAGNOSIS — Q619 Cystic kidney disease, unspecified: Secondary | ICD-10-CM | POA: Diagnosis not present

## 2014-06-21 DIAGNOSIS — M2040 Other hammer toe(s) (acquired), unspecified foot: Secondary | ICD-10-CM | POA: Diagnosis not present

## 2014-06-21 DIAGNOSIS — M2030 Hallux varus (acquired), unspecified foot: Secondary | ICD-10-CM | POA: Diagnosis not present

## 2014-06-21 DIAGNOSIS — B351 Tinea unguium: Secondary | ICD-10-CM | POA: Diagnosis not present

## 2014-07-03 ENCOUNTER — Other Ambulatory Visit: Payer: Self-pay | Admitting: Internal Medicine

## 2014-07-12 ENCOUNTER — Ambulatory Visit: Payer: Medicare Other

## 2014-07-12 ENCOUNTER — Other Ambulatory Visit: Payer: Medicare Other

## 2014-07-13 ENCOUNTER — Encounter: Payer: Self-pay | Admitting: Internal Medicine

## 2014-07-18 ENCOUNTER — Ambulatory Visit (HOSPITAL_BASED_OUTPATIENT_CLINIC_OR_DEPARTMENT_OTHER): Payer: Medicare Other

## 2014-07-18 ENCOUNTER — Ambulatory Visit (HOSPITAL_BASED_OUTPATIENT_CLINIC_OR_DEPARTMENT_OTHER): Payer: Medicare Other | Admitting: Hematology and Oncology

## 2014-07-18 ENCOUNTER — Telehealth: Payer: Self-pay | Admitting: Hematology and Oncology

## 2014-07-18 ENCOUNTER — Other Ambulatory Visit (HOSPITAL_BASED_OUTPATIENT_CLINIC_OR_DEPARTMENT_OTHER): Payer: Medicare Other

## 2014-07-18 ENCOUNTER — Encounter: Payer: Self-pay | Admitting: Hematology and Oncology

## 2014-07-18 VITALS — BP 143/56 | HR 73 | Temp 97.4°F | Resp 17 | Ht 59.0 in | Wt 150.8 lb

## 2014-07-18 DIAGNOSIS — K5900 Constipation, unspecified: Secondary | ICD-10-CM | POA: Insufficient documentation

## 2014-07-18 DIAGNOSIS — D47Z9 Other specified neoplasms of uncertain behavior of lymphoid, hematopoietic and related tissue: Secondary | ICD-10-CM

## 2014-07-18 DIAGNOSIS — F0390 Unspecified dementia without behavioral disturbance: Secondary | ICD-10-CM

## 2014-07-18 DIAGNOSIS — K5901 Slow transit constipation: Secondary | ICD-10-CM | POA: Diagnosis not present

## 2014-07-18 DIAGNOSIS — D471 Chronic myeloproliferative disease: Secondary | ICD-10-CM

## 2014-07-18 DIAGNOSIS — D469 Myelodysplastic syndrome, unspecified: Secondary | ICD-10-CM

## 2014-07-18 LAB — CBC WITH DIFFERENTIAL/PLATELET
BASO%: 0.8 % (ref 0.0–2.0)
Basophils Absolute: 0.1 10*3/uL (ref 0.0–0.1)
EOS%: 1.7 % (ref 0.0–7.0)
Eosinophils Absolute: 0.2 10*3/uL (ref 0.0–0.5)
HCT: 29.3 % — ABNORMAL LOW (ref 34.8–46.6)
HGB: 9.8 g/dL — ABNORMAL LOW (ref 11.6–15.9)
LYMPH%: 15.8 % (ref 14.0–49.7)
MCH: 28.5 pg (ref 25.1–34.0)
MCHC: 33.3 g/dL (ref 31.5–36.0)
MCV: 85.5 fL (ref 79.5–101.0)
MONO#: 0.7 10*3/uL (ref 0.1–0.9)
MONO%: 7.4 % (ref 0.0–14.0)
NEUT#: 6.6 10*3/uL — ABNORMAL HIGH (ref 1.5–6.5)
NEUT%: 74.3 % (ref 38.4–76.8)
Platelets: 315 10*3/uL (ref 145–400)
RBC: 3.43 10*6/uL — ABNORMAL LOW (ref 3.70–5.45)
RDW: 16.4 % — AB (ref 11.2–14.5)
WBC: 8.9 10*3/uL (ref 3.9–10.3)
lymph#: 1.4 10*3/uL (ref 0.9–3.3)

## 2014-07-18 MED ORDER — DARBEPOETIN ALFA-POLYSORBATE 300 MCG/0.6ML IJ SOLN
300.0000 ug | Freq: Once | INTRAMUSCULAR | Status: AC
  Administered 2014-07-18: 300 ug via SUBCUTANEOUS
  Filled 2014-07-18: qty 0.6

## 2014-07-18 NOTE — Assessment & Plan Note (Signed)
The patient has dementia. She is not on Aricept for some reason. I told her husband to call her primary care provider about medication refills.

## 2014-07-18 NOTE — Patient Instructions (Signed)
Darbepoetin Alfa injection What is this medicine? DARBEPOETIN ALFA (dar be POE e tin AL fa) helps your body make more red blood cells. It is used to treat anemia caused by chronic kidney failure and chemotherapy. This medicine may be used for other purposes; ask your health care provider or pharmacist if you have questions. COMMON BRAND NAME(S): Aranesp What should I tell my health care provider before I take this medicine? They need to know if you have any of these conditions: -blood clotting disorders or history of blood clots -cancer patient not on chemotherapy -cystic fibrosis -heart disease, such as angina, heart failure, or a history of a heart attack -hemoglobin level of 12 g/dL or greater -high blood pressure -low levels of folate, iron, or vitamin B12 -seizures -an unusual or allergic reaction to darbepoetin, erythropoietin, albumin, hamster proteins, latex, other medicines, foods, dyes, or preservatives -pregnant or trying to get pregnant -breast-feeding How should I use this medicine? This medicine is for injection into a vein or under the skin. It is usually given by a health care professional in a hospital or clinic setting. If you get this medicine at home, you will be taught how to prepare and give this medicine. Do not shake the solution before you withdraw a dose. Use exactly as directed. Take your medicine at regular intervals. Do not take your medicine more often than directed. It is important that you put your used needles and syringes in a special sharps container. Do not put them in a trash can. If you do not have a sharps container, call your pharmacist or healthcare provider to get one. Talk to your pediatrician regarding the use of this medicine in children. While this medicine may be used in children as young as 1 year for selected conditions, precautions do apply. Overdosage: If you think you have taken too much of this medicine contact a poison control center or  emergency room at once. NOTE: This medicine is only for you. Do not share this medicine with others. What if I miss a dose? If you miss a dose, take it as soon as you can. If it is almost time for your next dose, take only that dose. Do not take double or extra doses. What may interact with this medicine? Do not take this medicine with any of the following medications: -epoetin alfa This list may not describe all possible interactions. Give your health care provider a list of all the medicines, herbs, non-prescription drugs, or dietary supplements you use. Also tell them if you smoke, drink alcohol, or use illegal drugs. Some items may interact with your medicine. What should I watch for while using this medicine? Visit your prescriber or health care professional for regular checks on your progress and for the needed blood tests and blood pressure measurements. It is especially important for the doctor to make sure your hemoglobin level is in the desired range, to limit the risk of potential side effects and to give you the best benefit. Keep all appointments for any recommended tests. Check your blood pressure as directed. Ask your doctor what your blood pressure should be and when you should contact him or her. As your body makes more red blood cells, you may need to take iron, folic acid, or vitamin B supplements. Ask your doctor or health care provider which products are right for you. If you have kidney disease continue dietary restrictions, even though this medication can make you feel better. Talk with your doctor or health   care professional about the foods you eat and the vitamins that you take. What side effects may I notice from receiving this medicine? Side effects that you should report to your doctor or health care professional as soon as possible: -allergic reactions like skin rash, itching or hives, swelling of the face, lips, or tongue -breathing problems -changes in vision -chest  pain -confusion, trouble speaking or understanding -feeling faint or lightheaded, falls -high blood pressure -muscle aches or pains -pain, swelling, warmth in the leg -rapid weight gain -severe headaches -sudden numbness or weakness of the face, arm or leg -trouble walking, dizziness, loss of balance or coordination -seizures (convulsions) -swelling of the ankles, feet, hands -unusually weak or tired Side effects that usually do not require medical attention (report to your doctor or health care professional if they continue or are bothersome): -diarrhea -fever, chills (flu-like symptoms) -headaches -nausea, vomiting -redness, stinging, or swelling at site where injected This list may not describe all possible side effects. Call your doctor for medical advice about side effects. You may report side effects to FDA at 1-800-FDA-1088. Where should I keep my medicine? Keep out of the reach of children. Store in a refrigerator between 2 and 8 degrees C (36 and 46 degrees F). Do not freeze. Do not shake. Throw away any unused portion if using a single-dose vial. Throw away any unused medicine after the expiration date. NOTE: This sheet is a summary. It may not cover all possible information. If you have questions about this medicine, talk to your doctor, pharmacist, or health care provider.  2015, [employer]. (2008-11-15 10:23:57)  

## 2014-07-18 NOTE — Telephone Encounter (Signed)
gv and printed appt sched adn avs for pt for Jan and Feb 2016.Marland KitchenMarland Kitchenpt already has all appt for 2015

## 2014-07-18 NOTE — Progress Notes (Signed)
[facility] progress notes  Patient Care Team: Lisabeth Pick, MD as PCP - General (Internal Medicine) Milus Banister, MD as Consulting Physician (Gastroenterology) Heath Lark, MD as Consulting Physician (Hematology and Oncology)  CHIEF COMPLAINTS/PURPOSE OF VISIT:  Chronic anemia due to myelodysplastic syndrome  HISTORY OF PRESENTING ILLNESS:  Brittany [name] 80 y.o. female was transferred to my care after her prior physician has left.  I reviewed the patient's records extensive and collaborated the history with the patient. Summary of her history is as follows: This is a pleasant lady with a myeloproliferative disorder with dysplastic features. She was diagnosed in May of 2008 when she presented with unexplained normochromic anemia. Bone marrow aspiration and biopsy was done on 04/23/2007. Marrow was hypercellular for age 63-80% Maturation was normal in all 3 cell lines and there were only 1% blasts. Cytogenetics were normal including FISH studies to look at chromosome 5 and chromosome 7 deletions which were not present. Hemoglobin was 9.7 at the time with MCV 90, [place] count 4700, and platelets 162,000. There were no ringed sideroblasts.  She was initially followed with observation alone until hemoglobin fell to 8.6 by June of 2010. She was started on a trial of Aranesp and had a nice response with rise in hemoglobin up to 12.5 g. She is currently on a dose of 300 mcg every 4 weeks and we have been able to maintain her hemoglobin at 10. The patient is noted to have dementia. Her husband is having some difficulties taking care of her. On review of her medication list, it was noted she is on Aricept. The patient denies any recent signs or symptoms of bleeding such as spontaneous epistaxis, hematuria or hematochezia. She denies any symptoms of anemia such as chest pain, shortness of breath or dizziness.  MEDICAL HISTORY:  Past Medical History  Diagnosis Date  .  Melanoma     x2  . Diabetes mellitus   . Diverticulosis of colon   . Hyperlipidemia   . Hypothyroidism   . Osteoporosis   . Blood transfusion 2006  . Myeloproliferative disorder 02/04/2012  . Normochromic normocytic anemia 02/04/2012  . Hypertension   . Osteopenia     SURGICAL HISTORY: Past Surgical History  Procedure Laterality Date  . Polypectomy      Laparoscopic  . Appendectomy    . Carpal tunnel release    . Melanoma excision    . Replacement total knee    . Rectocele repair    . Vaginal hysterectomy    . Flexible sigmoidoscopy  07/09/2012    Procedure: FLEXIBLE SIGMOIDOSCOPY;  Surgeon: Inda Castle, MD;  Location: [facility];  Service: Endoscopy;  Laterality: N/A;    SOCIAL HISTORY: History   Social History  . Marital Status: Married    Spouse Name: N/A    Number of Children: N/A  . Years of Education: N/A   Occupational History  . Not on file.   Social History Main Topics  . Smoking status: Never Smoker   . Smokeless tobacco: Never Used  . Alcohol Use: No  . Drug Use: No  . Sexual Activity: Yes    Birth Control/ Protection: Surgical     Comment: HYST   Other Topics Concern  . Not on file   Social History Narrative  . No narrative on file    FAMILY HISTORY: Family History  Problem Relation Age of Onset  . Prostate cancer Brother   . Cancer Brother  renal cell carcinoma  . Heart disease Brother   . Breast cancer Mother 45  . Cancer Father     unknown kind    ALLERGIES:  is allergic to lisinopril and penicillins.  MEDICATIONS:  Current Outpatient Prescriptions  Medication Sig Dispense Refill  . acetaminophen (TYLENOL) 500 MG tablet 500 mg 3 times daily  30 tablet    . Ascorbic Acid (VITAMIN C) 500 MG tablet Take 500 mg by mouth daily.        Marland Kitchen aspirin EC 81 MG tablet Take 81 mg by mouth daily.      . Calcium Carbonate-Vit D-Min (CALTRATE 600+D PLUS) 600-400 MG-UNIT per tablet Take 1 tablet by mouth daily.        . Cholecalciferol  (VITAMIN D-3 PO) Take by mouth daily.      . cycloSPORINE (RESTASIS) 0.05 % ophthalmic emulsion Place 1 drop into both eyes 2 (two) times daily.        Marland Kitchen donepezil (ARICEPT) 5 MG tablet 1 tablet daily      . iron polysaccharides (NIFEREX) 150 MG capsule Take 150 mg by mouth 2 (two) times daily.       Marland Kitchen levothyroxine (SYNTHROID, LEVOTHROID) 88 MCG tablet TAKE 1 TABLET BY MOUTH ONCE DAILY  90 tablet  0  . losartan (COZAAR) 100 MG tablet TAKE 1 TABLET EVERY DAY FOR BLOOD PRESSURE  30 tablet  5  . Memantine HCl ER (NAMENDA XR) 28 MG CP24 Take 28 mg by mouth daily.  30 capsule  6  . Multiple Vitamin (MULTIVITAMIN WITH MINERALS) TABS Take 1 tablet by mouth daily.      . polyethylene glycol powder (GLYCOLAX/MIRALAX) powder Take 17 g by mouth daily as needed.      . traMADol (ULTRAM) 50 MG tablet TAKE 1 TABLET TWICE A DAY AS NEEDED FOR PAIN  40 tablet  1   No current facility-administered medications for this visit.    REVIEW OF SYSTEMS:  This is the reliable due to dementia. All other systems were reviewed with the patient and are negative.  PHYSICAL EXAMINATION: ECOG PERFORMANCE STATUS: 1 - Symptomatic but completely ambulatory  Filed Vitals:   07/18/14 1509  BP: 143/56  Pulse: 73  Temp: 97.4 F (36.3 C)  Resp: 17   Filed Weights   07/18/14 1509  Weight: 150 lb 12.8 oz (68.402 kg)    GENERAL:alert, no distress and comfortable SKIN: skin color is pale, texture, turgor are normal, no rashes or significant lesions EYES: normal, conjunctiva are pale and non-injected, sclera clear OROPHARYNX:no exudate, normal lips, buccal mucosa, and tongue  NECK: supple, thyroid normal size, non-tender, without nodularity LYMPH:  no palpable lymphadenopathy in the cervical, axillary or inguinal LUNGS: clear to auscultation and percussion with normal breathing effort HEART: regular rate & rhythm and no murmurs without lower extremity edema ABDOMEN:abdomen soft, non-tender and normal bowel  sounds Musculoskeletal:no cyanosis of digits and no clubbing  PSYCH: alert with fluent speech NEURO: no focal motor/sensory deficits  LABORATORY DATA:  I have reviewed the data as listed Lab Results  Component Value Date   WBC 8.9 07/18/2014   HGB 9.8* 07/18/2014   HCT 29.3* 07/18/2014   MCV 85.5 07/18/2014   PLT 315 07/18/2014    Recent Labs  11/30/13 0935  NA 138  K 4.5  CL 106  CO2 24  GLUCOSE 87  BUN 34*  CREATININE 1.1  CALCIUM 9.6  PROT 6.3  ALBUMIN 4.2  AST 20  ALT 19  ALKPHOS  69  BILITOT 0.9  BILIDIR 0.1    ASSESSMENT & PLAN:  MYELODYSPLASTIC SYNDROME She responded well to darbopoeitin injection every other month. Due to history of stroke, I recommend giving her injection only to keep hemoglobin greater than 10 g. I told her to discontinue iron supplement and we'll recheck her iron studies in the next visit.  Constipation This is likely related to medications. I told her to discontinue oral iron supplements. If she is still constipated, she can take MiraLAX.  Dementia The patient has dementia. She is not on Aricept for some reason. I told her husband to call her primary care provider about medication refills.    Orders Placed This Encounter  Procedures  . CBC with Differential    Standing Status: Future     Number of Occurrences:      Standing Expiration Date: 08/22/2015  . Ferritin    Standing Status: Future     Number of Occurrences:      Standing Expiration Date: 08/22/2015  . Iron and TIBC    Standing Status: Future     Number of Occurrences:      Standing Expiration Date: 08/22/2015    All questions were answered. The patient knows to call the clinic with any problems, questions or concerns. I spent 25 minutes counseling the patient face to face. The total time spent in the appointment was 30 minutes and more than 50% was on counseling.     [facility], [place], MD 07/18/2014 3:36 PM

## 2014-07-18 NOTE — Assessment & Plan Note (Addendum)
She responded well to darbopoeitin injection every other month. Due to history of stroke, I recommend giving her injection only to keep hemoglobin greater than 10 g. I told her to discontinue iron supplement and we'll recheck her iron studies in the next visit.

## 2014-07-18 NOTE — Assessment & Plan Note (Signed)
This is likely related to medications. I told her to discontinue oral iron supplements. If she is still constipated, she can take MiraLAX.

## 2014-08-09 ENCOUNTER — Other Ambulatory Visit (HOSPITAL_BASED_OUTPATIENT_CLINIC_OR_DEPARTMENT_OTHER): Payer: Medicare Other

## 2014-08-09 ENCOUNTER — Ambulatory Visit: Payer: Medicare Other

## 2014-08-09 DIAGNOSIS — D469 Myelodysplastic syndrome, unspecified: Secondary | ICD-10-CM | POA: Diagnosis not present

## 2014-08-09 LAB — CBC WITH DIFFERENTIAL/PLATELET
BASO%: 1.2 % (ref 0.0–2.0)
BASOS ABS: 0.1 10*3/uL (ref 0.0–0.1)
EOS%: 1.3 % (ref 0.0–7.0)
Eosinophils Absolute: 0.1 10*3/uL (ref 0.0–0.5)
HCT: 33 % — ABNORMAL LOW (ref 34.8–46.6)
HGB: 10.9 g/dL — ABNORMAL LOW (ref 11.6–15.9)
LYMPH#: 1.1 10*3/uL (ref 0.9–3.3)
LYMPH%: 10.2 % — AB (ref 14.0–49.7)
MCH: 28.1 pg (ref 25.1–34.0)
MCHC: 32.9 g/dL (ref 31.5–36.0)
MCV: 85.3 fL (ref 79.5–101.0)
MONO#: 0.5 10*3/uL (ref 0.1–0.9)
MONO%: 5.1 % (ref 0.0–14.0)
NEUT%: 82.2 % — AB (ref 38.4–76.8)
NEUTROS ABS: 8.6 10*3/uL — AB (ref 1.5–6.5)
Platelets: 271 10*3/uL (ref 145–400)
RBC: 3.87 10*6/uL (ref 3.70–5.45)
RDW: 17.5 % — ABNORMAL HIGH (ref 11.2–14.5)
WBC: 10.4 10*3/uL — ABNORMAL HIGH (ref 3.9–10.3)

## 2014-08-09 LAB — FERRITIN CHCC: Ferritin: 953 ng/ml — ABNORMAL HIGH (ref 9–269)

## 2014-08-09 LAB — IRON AND TIBC CHCC
%SAT: 13 % — ABNORMAL LOW (ref 21–57)
Iron: 38 ug/dL — ABNORMAL LOW (ref 41–142)
TIBC: 288 ug/dL (ref 236–444)
UIBC: 249 ug/dL (ref 120–384)

## 2014-08-09 NOTE — Progress Notes (Signed)
Brittany [name] here for lab and possible injection appointments.  Hgb is 10.9 today.  She won't receive Aranesp today.  Goal is to keep Hgb >10.0.

## 2014-08-12 ENCOUNTER — Encounter: Payer: Self-pay | Admitting: Gastroenterology

## 2014-09-05 ENCOUNTER — Other Ambulatory Visit: Payer: Self-pay | Admitting: Hematology and Oncology

## 2014-09-05 DIAGNOSIS — D469 Myelodysplastic syndrome, unspecified: Secondary | ICD-10-CM

## 2014-09-06 ENCOUNTER — Other Ambulatory Visit (HOSPITAL_BASED_OUTPATIENT_CLINIC_OR_DEPARTMENT_OTHER): Payer: Medicare Other

## 2014-09-06 ENCOUNTER — Ambulatory Visit: Payer: Medicare Other

## 2014-09-06 DIAGNOSIS — D469 Myelodysplastic syndrome, unspecified: Secondary | ICD-10-CM | POA: Diagnosis not present

## 2014-09-06 LAB — CBC WITH DIFFERENTIAL/PLATELET
BASO%: 0.2 % (ref 0.0–2.0)
BASOS ABS: 0 10*3/uL (ref 0.0–0.1)
EOS ABS: 0.2 10*3/uL (ref 0.0–0.5)
EOS%: 2 % (ref 0.0–7.0)
HCT: 30.5 % — ABNORMAL LOW (ref 34.8–46.6)
HEMOGLOBIN: 10.1 g/dL — AB (ref 11.6–15.9)
LYMPH#: 1.7 10*3/uL (ref 0.9–3.3)
LYMPH%: 18.2 % (ref 14.0–49.7)
MCH: 28.1 pg (ref 25.1–34.0)
MCHC: 33.1 g/dL (ref 31.5–36.0)
MCV: 85 fL (ref 79.5–101.0)
MONO#: 0.5 10*3/uL (ref 0.1–0.9)
MONO%: 5.4 % (ref 0.0–14.0)
NEUT%: 74.2 % (ref 38.4–76.8)
NEUTROS ABS: 6.9 10*3/uL — AB (ref 1.5–6.5)
Platelets: 248 10*3/uL (ref 145–400)
RBC: 3.59 10*6/uL — ABNORMAL LOW (ref 3.70–5.45)
RDW: 17 % — AB (ref 11.2–14.5)
WBC: 9.3 10*3/uL (ref 3.9–10.3)

## 2014-09-06 NOTE — Progress Notes (Signed)
Brittany [name] here for labs and possible Aranesp injection.   HGB 10.1 today.  Will hold today because Hemoglobin >10.0

## 2014-09-07 ENCOUNTER — Telehealth: Payer: Self-pay | Admitting: Internal Medicine

## 2014-09-07 NOTE — Telephone Encounter (Signed)
[id number] - [place], [place] - [address] is requesting 90 day re-fill on levothyroxine (SYNTHROID, LEVOTHROID) 88 MCG tablet

## 2014-09-08 ENCOUNTER — Telehealth: Payer: Self-pay | Admitting: Internal Medicine

## 2014-09-08 DIAGNOSIS — C44319 Basal cell carcinoma of skin of other parts of face: Secondary | ICD-10-CM | POA: Diagnosis not present

## 2014-09-08 MED ORDER — LEVOTHYROXINE SODIUM 88 MCG PO TABS: ORAL_TABLET | ORAL | Status: DC

## 2014-09-08 NOTE — Telephone Encounter (Signed)
I will call pt's next week to schedule. [name] husband asked to wail till next week since they are going out of town.

## 2014-09-08 NOTE — Telephone Encounter (Signed)
3 month supply given.  She is going to need to establish with another physician.  Please call to schedule

## 2014-09-08 NOTE — Telephone Encounter (Signed)
[name] spouse would like to know if Dr. Raliegh Ip will accept his wife and himself Braelyn Hinck as a transfer patient??

## 2014-09-08 NOTE — Telephone Encounter (Signed)
ok 

## 2014-09-12 NOTE — Telephone Encounter (Signed)
Pt and spouse are scheduled to see Dr. Raliegh Ip

## 2014-09-14 NOTE — Telephone Encounter (Signed)
Pt scheduled w/ dr Raliegh Ip

## 2014-09-26 ENCOUNTER — Ambulatory Visit: Payer: Medicare Other | Admitting: Internal Medicine

## 2014-09-29 ENCOUNTER — Ambulatory Visit (INDEPENDENT_AMBULATORY_CARE_PROVIDER_SITE_OTHER): Payer: Medicare Other | Admitting: Internal Medicine

## 2014-09-29 ENCOUNTER — Encounter: Payer: Self-pay | Admitting: Internal Medicine

## 2014-09-29 VITALS — BP 100/60 | HR 66 | Temp 97.7°F | Resp 18 | Ht 59.0 in | Wt 148.0 lb

## 2014-09-29 DIAGNOSIS — F0390 Unspecified dementia without behavioral disturbance: Secondary | ICD-10-CM | POA: Diagnosis not present

## 2014-09-29 DIAGNOSIS — I10 Essential (primary) hypertension: Secondary | ICD-10-CM

## 2014-09-29 DIAGNOSIS — E038 Other specified hypothyroidism: Secondary | ICD-10-CM | POA: Diagnosis not present

## 2014-09-29 DIAGNOSIS — E034 Atrophy of thyroid (acquired): Secondary | ICD-10-CM | POA: Diagnosis not present

## 2014-09-29 DIAGNOSIS — D469 Myelodysplastic syndrome, unspecified: Secondary | ICD-10-CM

## 2014-09-29 DIAGNOSIS — Z23 Encounter for immunization: Secondary | ICD-10-CM

## 2014-09-29 DIAGNOSIS — E119 Type 2 diabetes mellitus without complications: Secondary | ICD-10-CM

## 2014-09-29 NOTE — Patient Instructions (Signed)
Return in 6 months for follow-up  Limit your sodium (Salt) intake   

## 2014-09-29 NOTE — Progress Notes (Signed)
Subjective:    Patient ID: Brittany [name], female    DOB: 10-21-1924, 80 y.o.   MRN: [id number]  HPI 80 year old patient who is seen today to establish with my practice. The patient has a history of melanoma and is followed by dermatology.  She also has a history of MDS and is followed by hematology. Medical problems include dementia, and history of cerebrovascular disease.  She has a history of impaired glucose tolerance and treated hypertension.  She has hypothyroidism Complaints today include chronic low back pain, constipation, and occasional cough  Past Medical History  Diagnosis Date  . Melanoma     x2  . Diabetes mellitus   . Diverticulosis of colon   . Hyperlipidemia   . Hypothyroidism   . Osteoporosis   . Blood transfusion 2006  . Myeloproliferative disorder 02/04/2012  . Normochromic normocytic anemia 02/04/2012  . Hypertension   . Osteopenia     History   Social History  . Marital Status: Married    Spouse Name: N/A    Number of Children: N/A  . Years of Education: N/A   Occupational History  . Not on file.   Social History Main Topics  . Smoking status: Never Smoker   . Smokeless tobacco: Never Used  . Alcohol Use: No  . Drug Use: No  . Sexual Activity: Yes    Birth Control/ Protection: Surgical     Comment: HYST   Other Topics Concern  . Not on file   Social History Narrative  . No narrative on file    Past Surgical History  Procedure Laterality Date  . Polypectomy      Laparoscopic  . Appendectomy    . Carpal tunnel release    . Melanoma excision    . Replacement total knee    . Rectocele repair    . Vaginal hysterectomy    . Flexible sigmoidoscopy  07/09/2012    Procedure: FLEXIBLE SIGMOIDOSCOPY;  Surgeon: Inda Castle, MD;  Location: [facility];  Service: Endoscopy;  Laterality: N/A;    Family History  Problem Relation Age of Onset  . Prostate cancer Brother   . Cancer Brother     renal cell carcinoma  . Heart disease Brother     . Breast cancer Mother 22  . Cancer Father     unknown kind    Allergies  Allergen Reactions  . Lisinopril     REACTION: angioedema  . Penicillins     REACTION: rash    Current Outpatient Prescriptions on File Prior to Visit  Medication Sig Dispense Refill  . acetaminophen (TYLENOL) 500 MG tablet 500 mg 3 times daily  30 tablet    . Ascorbic Acid (VITAMIN C) 500 MG tablet Take 500 mg by mouth daily.        Marland Kitchen aspirin EC 81 MG tablet Take 81 mg by mouth daily.      . Calcium Carbonate-Vit D-Min (CALTRATE 600+D PLUS) 600-400 MG-UNIT per tablet Take 1 tablet by mouth daily.        . Cholecalciferol (VITAMIN D-3 PO) Take by mouth daily.      . cycloSPORINE (RESTASIS) 0.05 % ophthalmic emulsion Place 1 drop into both eyes 2 (two) times daily.        Marland Kitchen donepezil (ARICEPT) 5 MG tablet 1 tablet daily      . levothyroxine (SYNTHROID, LEVOTHROID) 88 MCG tablet TAKE 1 TABLET BY MOUTH ONCE DAILY  90 tablet  0  . losartan (COZAAR) 100 MG  tablet TAKE 1 TABLET EVERY DAY FOR BLOOD PRESSURE  30 tablet  5  . Multiple Vitamin (MULTIVITAMIN WITH MINERALS) TABS Take 1 tablet by mouth daily.      . polyethylene glycol powder (GLYCOLAX/MIRALAX) powder Take 17 g by mouth daily as needed.       No current facility-administered medications on file prior to visit.    BP 100/60  Pulse 66  Temp(Src) 97.7 F (36.5 C) (Oral)  Resp 18  Ht 4\' 11"  (1.499 m)  Wt 148 lb (67.132 kg)  BMI 29.88 kg/m2  SpO2 96%       Review of Systems  Constitutional: Negative.   HENT: Negative for congestion, dental problem, hearing loss, rhinorrhea, sinus pressure, sore throat and tinnitus.   Eyes: Negative for pain, discharge and visual disturbance.  Respiratory: Positive for cough. Negative for shortness of breath.   Cardiovascular: Negative for chest pain, palpitations and leg swelling.  Gastrointestinal: Positive for constipation. Negative for nausea, vomiting, abdominal pain, diarrhea, blood in stool and  abdominal distention.  Genitourinary: Negative for dysuria, urgency, frequency, hematuria, flank pain, vaginal bleeding, vaginal discharge, difficulty urinating, vaginal pain and pelvic pain.  Musculoskeletal: Positive for back pain and gait problem. Negative for arthralgias and joint swelling.  Skin: Negative for rash.  Neurological: Negative for dizziness, syncope, speech difficulty, weakness, numbness and headaches.  Hematological: Negative for adenopathy.  Psychiatric/Behavioral: Negative for behavioral problems, dysphoric mood and agitation. The patient is not nervous/anxious.        Objective:   Physical Exam  Constitutional: She is oriented to person, place, and time. She appears well-developed and well-nourished.  Blood pressure low normal  HENT:  Head: Normocephalic and atraumatic.  Right Ear: External ear normal.  Left Ear: External ear normal.  Mouth/Throat: Oropharynx is clear and moist.  Eyes: Conjunctivae and EOM are normal.  Neck: Normal range of motion. Neck supple. No JVD present. No thyromegaly present.  Cardiovascular: Normal rate, regular rhythm, normal heart sounds and intact distal pulses.   No murmur heard. Pulmonary/Chest: Effort normal and breath sounds normal. She has no wheezes. She has no rales.  Abdominal: Soft. Bowel sounds are normal. She exhibits no distension and no mass. There is no tenderness. There is no rebound and no guarding.  Lower midline scar  Genitourinary: Vagina normal.  Musculoskeletal: Normal range of motion. She exhibits no edema and no tenderness.  Bilateral total knee replacement scars  Neurological: She is alert and oriented to person, place, and time. She has normal reflexes. No cranial nerve deficit. She exhibits normal muscle tone. Coordination normal.  Skin: Skin is warm and dry. No rash noted.  Psychiatric: She has a normal mood and affect. Her behavior is normal.  Mild confusion          Assessment & Plan:   Mild  dementia.  Continue Aricept History of diabetes.  We'll check a hemoglobin A1c Essential hypertension, stable MDS.  Followup hematology History of cerebrovascular disease.  Continue aggressive risk factor modification Chronic back pain Chronic constipation

## 2014-09-29 NOTE — Progress Notes (Signed)
Pre visit review using our clinic review tool, if applicable. No additional management support is needed unless otherwise documented below in the visit note. 

## 2014-09-30 ENCOUNTER — Telehealth: Payer: Self-pay | Admitting: Internal Medicine

## 2014-09-30 LAB — COMPREHENSIVE METABOLIC PANEL
ALT: 16 U/L (ref 0–35)
AST: 18 U/L (ref 0–37)
Albumin: 3.8 g/dL (ref 3.5–5.2)
Alkaline Phosphatase: 62 U/L (ref 39–117)
BILIRUBIN TOTAL: 0.8 mg/dL (ref 0.2–1.2)
BUN: 26 mg/dL — AB (ref 6–23)
CO2: 19 meq/L (ref 19–32)
Calcium: 9.9 mg/dL (ref 8.4–10.5)
Chloride: 108 mEq/L (ref 96–112)
Creatinine, Ser: 1.1 mg/dL (ref 0.4–1.2)
GFR: 49.78 mL/min — AB (ref >60.00)
GLUCOSE: 95 mg/dL (ref 70–99)
Potassium: 5.3 mEq/L — ABNORMAL HIGH (ref 3.5–5.1)
Sodium: 140 mEq/L (ref 135–145)
TOTAL PROTEIN: 7.1 g/dL (ref 6.0–8.3)

## 2014-09-30 LAB — TSH: TSH: 0.51 u[IU]/mL (ref 0.35–4.50)

## 2014-09-30 NOTE — Telephone Encounter (Signed)
emmi emailed °

## 2014-10-04 ENCOUNTER — Other Ambulatory Visit: Payer: Self-pay | Admitting: Hematology and Oncology

## 2014-10-04 ENCOUNTER — Other Ambulatory Visit (HOSPITAL_BASED_OUTPATIENT_CLINIC_OR_DEPARTMENT_OTHER): Payer: Medicare Other

## 2014-10-04 ENCOUNTER — Ambulatory Visit (HOSPITAL_BASED_OUTPATIENT_CLINIC_OR_DEPARTMENT_OTHER): Payer: Medicare Other

## 2014-10-04 VITALS — BP 136/47 | HR 81 | Temp 97.8°F

## 2014-10-04 DIAGNOSIS — D469 Myelodysplastic syndrome, unspecified: Secondary | ICD-10-CM | POA: Diagnosis not present

## 2014-10-04 DIAGNOSIS — D471 Chronic myeloproliferative disease: Secondary | ICD-10-CM

## 2014-10-04 DIAGNOSIS — D649 Anemia, unspecified: Secondary | ICD-10-CM

## 2014-10-04 LAB — CBC WITH DIFFERENTIAL/PLATELET
BASO%: 1.4 % (ref 0.0–2.0)
Basophils Absolute: 0.1 10*3/uL (ref 0.0–0.1)
EOS%: 3 % (ref 0.0–7.0)
Eosinophils Absolute: 0.2 10*3/uL (ref 0.0–0.5)
HEMATOCRIT: 31 % — AB (ref 34.8–46.6)
HGB: 10 g/dL — ABNORMAL LOW (ref 11.6–15.9)
LYMPH#: 1.5 10*3/uL (ref 0.9–3.3)
LYMPH%: 22 % (ref 14.0–49.7)
MCH: 27.7 pg (ref 25.1–34.0)
MCHC: 32.4 g/dL (ref 31.5–36.0)
MCV: 85.6 fL (ref 79.5–101.0)
MONO#: 0.5 10*3/uL (ref 0.1–0.9)
MONO%: 6.8 % (ref 0.0–14.0)
NEUT%: 66.8 % (ref 38.4–76.8)
NEUTROS ABS: 4.6 10*3/uL (ref 1.5–6.5)
Platelets: 293 10*3/uL (ref 145–400)
RBC: 3.62 10*6/uL — ABNORMAL LOW (ref 3.70–5.45)
RDW: 16.4 % — ABNORMAL HIGH (ref 11.2–14.5)
WBC: 6.9 10*3/uL (ref 3.9–10.3)

## 2014-10-04 MED ORDER — DARBEPOETIN ALFA-POLYSORBATE 300 MCG/0.6ML IJ SOLN
300.0000 ug | Freq: Once | INTRAMUSCULAR | Status: AC
  Administered 2014-10-04: 300 ug via SUBCUTANEOUS
  Filled 2014-10-04: qty 0.6

## 2014-10-04 NOTE — Patient Instructions (Signed)
Darbepoetin Alfa injection What is this medicine? DARBEPOETIN ALFA (dar be POE e tin AL fa) helps your body make more red blood cells. It is used to treat anemia caused by chronic kidney failure and chemotherapy. This medicine may be used for other purposes; ask your health care provider or pharmacist if you have questions. COMMON BRAND NAME(S): Aranesp What should I tell my health care provider before I take this medicine? They need to know if you have any of these conditions: -blood clotting disorders or history of blood clots -cancer patient not on chemotherapy -cystic fibrosis -heart disease, such as angina, heart failure, or a history of a heart attack -hemoglobin level of 12 g/dL or greater -high blood pressure -low levels of folate, iron, or vitamin B12 -seizures -an unusual or allergic reaction to darbepoetin, erythropoietin, albumin, hamster proteins, latex, other medicines, foods, dyes, or preservatives -pregnant or trying to get pregnant -breast-feeding How should I use this medicine? This medicine is for injection into a vein or under the skin. It is usually given by a health care professional in a hospital or clinic setting. If you get this medicine at home, you will be taught how to prepare and give this medicine. Do not shake the solution before you withdraw a dose. Use exactly as directed. Take your medicine at regular intervals. Do not take your medicine more often than directed. It is important that you put your used needles and syringes in a special sharps container. Do not put them in a trash can. If you do not have a sharps container, call your pharmacist or healthcare provider to get one. Talk to your pediatrician regarding the use of this medicine in children. While this medicine may be used in children as young as 1 year for selected conditions, precautions do apply. Overdosage: If you think you have taken too much of this medicine contact a poison control center or  emergency room at once. NOTE: This medicine is only for you. Do not share this medicine with others. What if I miss a dose? If you miss a dose, take it as soon as you can. If it is almost time for your next dose, take only that dose. Do not take double or extra doses. What may interact with this medicine? Do not take this medicine with any of the following medications: -epoetin alfa This list may not describe all possible interactions. Give your health care provider a list of all the medicines, herbs, non-prescription drugs, or dietary supplements you use. Also tell them if you smoke, drink alcohol, or use illegal drugs. Some items may interact with your medicine. What should I watch for while using this medicine? Visit your prescriber or health care professional for regular checks on your progress and for the needed blood tests and blood pressure measurements. It is especially important for the doctor to make sure your hemoglobin level is in the desired range, to limit the risk of potential side effects and to give you the best benefit. Keep all appointments for any recommended tests. Check your blood pressure as directed. Ask your doctor what your blood pressure should be and when you should contact him or her. As your body makes more red blood cells, you may need to take iron, folic acid, or vitamin B supplements. Ask your doctor or health care provider which products are right for you. If you have kidney disease continue dietary restrictions, even though this medication can make you feel better. Talk with your doctor or health   care professional about the foods you eat and the vitamins that you take. What side effects may I notice from receiving this medicine? Side effects that you should report to your doctor or health care professional as soon as possible: -allergic reactions like skin rash, itching or hives, swelling of the face, lips, or tongue -breathing problems -changes in vision -chest  pain -confusion, trouble speaking or understanding -feeling faint or lightheaded, falls -high blood pressure -muscle aches or pains -pain, swelling, warmth in the leg -rapid weight gain -severe headaches -sudden numbness or weakness of the face, arm or leg -trouble walking, dizziness, loss of balance or coordination -seizures (convulsions) -swelling of the ankles, feet, hands -unusually weak or tired Side effects that usually do not require medical attention (report to your doctor or health care professional if they continue or are bothersome): -diarrhea -fever, chills (flu-like symptoms) -headaches -nausea, vomiting -redness, stinging, or swelling at site where injected This list may not describe all possible side effects. Call your doctor for medical advice about side effects. You may report side effects to FDA at 1-800-FDA-1088. Where should I keep my medicine? Keep out of the reach of children. Store in a refrigerator between 2 and 8 degrees C (36 and 46 degrees F). Do not freeze. Do not shake. Throw away any unused portion if using a single-dose vial. Throw away any unused medicine after the expiration date. NOTE: This sheet is a summary. It may not cover all possible information. If you have questions about this medicine, talk to your doctor, pharmacist, or health care provider.  2015, [employer]. (2008-11-15 10:23:57)  

## 2014-10-05 DIAGNOSIS — Z23 Encounter for immunization: Secondary | ICD-10-CM | POA: Diagnosis not present

## 2014-10-17 ENCOUNTER — Encounter: Payer: Self-pay | Admitting: Internal Medicine

## 2014-11-01 ENCOUNTER — Other Ambulatory Visit (HOSPITAL_BASED_OUTPATIENT_CLINIC_OR_DEPARTMENT_OTHER): Payer: Medicare Other

## 2014-11-01 ENCOUNTER — Ambulatory Visit: Payer: Medicare Other

## 2014-11-01 DIAGNOSIS — D649 Anemia, unspecified: Secondary | ICD-10-CM

## 2014-11-01 DIAGNOSIS — D469 Myelodysplastic syndrome, unspecified: Secondary | ICD-10-CM

## 2014-11-01 LAB — CBC WITH DIFFERENTIAL/PLATELET
BASO%: 1.4 % (ref 0.0–2.0)
Basophils Absolute: 0.1 10*3/uL (ref 0.0–0.1)
EOS ABS: 0.2 10*3/uL (ref 0.0–0.5)
EOS%: 2.9 % (ref 0.0–7.0)
HCT: 34.5 % — ABNORMAL LOW (ref 34.8–46.6)
HGB: 11.3 g/dL — ABNORMAL LOW (ref 11.6–15.9)
LYMPH%: 20.5 % (ref 14.0–49.7)
MCH: 28 pg (ref 25.1–34.0)
MCHC: 32.6 g/dL (ref 31.5–36.0)
MCV: 85.7 fL (ref 79.5–101.0)
MONO#: 0.3 10*3/uL (ref 0.1–0.9)
MONO%: 4 % (ref 0.0–14.0)
NEUT#: 4.9 10*3/uL (ref 1.5–6.5)
NEUT%: 71.2 % (ref 38.4–76.8)
Platelets: 256 10*3/uL (ref 145–400)
RBC: 4.03 10*6/uL (ref 3.70–5.45)
RDW: 16.3 % — ABNORMAL HIGH (ref 11.2–14.5)
WBC: 6.9 10*3/uL (ref 3.9–10.3)
lymph#: 1.4 10*3/uL (ref 0.9–3.3)

## 2014-11-01 NOTE — Progress Notes (Signed)
Patient here for labs and possible aranesp.  Hgb 11.3 and aranesp held due to Hgb greater than 10.  Discussed with patient and copy of labs given to patient.

## 2014-11-29 ENCOUNTER — Other Ambulatory Visit (HOSPITAL_BASED_OUTPATIENT_CLINIC_OR_DEPARTMENT_OTHER): Payer: Medicare Other

## 2014-11-29 ENCOUNTER — Ambulatory Visit: Payer: Medicare Other

## 2014-11-29 DIAGNOSIS — D469 Myelodysplastic syndrome, unspecified: Secondary | ICD-10-CM

## 2014-11-29 DIAGNOSIS — D649 Anemia, unspecified: Secondary | ICD-10-CM

## 2014-11-29 LAB — CBC WITH DIFFERENTIAL/PLATELET
BASO%: 1.3 % (ref 0.0–2.0)
BASOS ABS: 0.1 10*3/uL (ref 0.0–0.1)
EOS%: 2.6 % (ref 0.0–7.0)
Eosinophils Absolute: 0.2 10*3/uL (ref 0.0–0.5)
HEMATOCRIT: 32 % — AB (ref 34.8–46.6)
HGB: 10.4 g/dL — ABNORMAL LOW (ref 11.6–15.9)
LYMPH%: 19.3 % (ref 14.0–49.7)
MCH: 27.6 pg (ref 25.1–34.0)
MCHC: 32.4 g/dL (ref 31.5–36.0)
MCV: 85.2 fL (ref 79.5–101.0)
MONO#: 0.8 10*3/uL (ref 0.1–0.9)
MONO%: 9.1 % (ref 0.0–14.0)
NEUT#: 5.7 10*3/uL (ref 1.5–6.5)
NEUT%: 67.7 % (ref 38.4–76.8)
Platelets: 297 10*3/uL (ref 145–400)
RBC: 3.76 10*6/uL (ref 3.70–5.45)
RDW: 16.7 % — ABNORMAL HIGH (ref 11.2–14.5)
WBC: 8.3 10*3/uL (ref 3.9–10.3)
lymph#: 1.6 10*3/uL (ref 0.9–3.3)

## 2014-12-02 DIAGNOSIS — Z1231 Encounter for screening mammogram for malignant neoplasm of breast: Secondary | ICD-10-CM | POA: Diagnosis not present

## 2014-12-02 DIAGNOSIS — Z803 Family history of malignant neoplasm of breast: Secondary | ICD-10-CM | POA: Diagnosis not present

## 2014-12-02 LAB — HM MAMMOGRAPHY: HM Mammogram: NEGATIVE

## 2014-12-06 ENCOUNTER — Encounter: Payer: Self-pay | Admitting: Internal Medicine

## 2014-12-12 ENCOUNTER — Other Ambulatory Visit: Payer: Self-pay | Admitting: Internal Medicine

## 2014-12-15 ENCOUNTER — Encounter: Payer: Self-pay | Admitting: Internal Medicine

## 2014-12-23 ENCOUNTER — Telehealth: Payer: Self-pay | Admitting: Hematology and Oncology

## 2014-12-23 NOTE — Telephone Encounter (Signed)
no vm.....mailed pt appt sched and letter of d.t change of appt due to MD on pal

## 2014-12-25 ENCOUNTER — Other Ambulatory Visit: Payer: Self-pay | Admitting: Internal Medicine

## 2014-12-27 ENCOUNTER — Other Ambulatory Visit: Payer: Medicare Other

## 2014-12-27 ENCOUNTER — Ambulatory Visit: Payer: Medicare Other

## 2014-12-28 ENCOUNTER — Telehealth: Payer: Self-pay | Admitting: Internal Medicine

## 2014-12-28 NOTE — Telephone Encounter (Addendum)
Brittany [name] has a question about the donepezil (ARICEPT) 5 MG tablet Pt has been taking 10 mg  They are going to fax over order and pls clarify amount pt taking

## 2014-12-29 ENCOUNTER — Telehealth: Payer: Self-pay | Admitting: Hematology and Oncology

## 2014-12-29 NOTE — Telephone Encounter (Signed)
s.w pt dtr in law and r/s missed appt...ok and aware of d.t

## 2014-12-30 NOTE — Telephone Encounter (Signed)
Fax put in Dr. Marthann Schiller basket to fill out.

## 2014-12-30 NOTE — Telephone Encounter (Signed)
Faxed

## 2015-01-03 ENCOUNTER — Ambulatory Visit: Payer: Medicare Other

## 2015-01-03 ENCOUNTER — Other Ambulatory Visit (HOSPITAL_BASED_OUTPATIENT_CLINIC_OR_DEPARTMENT_OTHER): Payer: Medicare Other

## 2015-01-03 DIAGNOSIS — D469 Myelodysplastic syndrome, unspecified: Secondary | ICD-10-CM

## 2015-01-03 DIAGNOSIS — D649 Anemia, unspecified: Secondary | ICD-10-CM | POA: Diagnosis not present

## 2015-01-03 LAB — CBC WITH DIFFERENTIAL/PLATELET
BASO%: 1.2 % (ref 0.0–2.0)
Basophils Absolute: 0.1 10*3/uL (ref 0.0–0.1)
EOS ABS: 0.2 10*3/uL (ref 0.0–0.5)
EOS%: 2.5 % (ref 0.0–7.0)
HCT: 31.2 % — ABNORMAL LOW (ref 34.8–46.6)
HGB: 10.1 g/dL — ABNORMAL LOW (ref 11.6–15.9)
LYMPH#: 1.3 10*3/uL (ref 0.9–3.3)
LYMPH%: 15.8 % (ref 14.0–49.7)
MCH: 27.6 pg (ref 25.1–34.0)
MCHC: 32.4 g/dL (ref 31.5–36.0)
MCV: 85.1 fL (ref 79.5–101.0)
MONO#: 0.6 10*3/uL (ref 0.1–0.9)
MONO%: 6.8 % (ref 0.0–14.0)
NEUT#: 6.2 10*3/uL (ref 1.5–6.5)
NEUT%: 73.7 % (ref 38.4–76.8)
Platelets: 296 10*3/uL (ref 145–400)
RBC: 3.66 10*6/uL — AB (ref 3.70–5.45)
RDW: 17 % — AB (ref 11.2–14.5)
WBC: 8.4 10*3/uL (ref 3.9–10.3)

## 2015-01-03 NOTE — Progress Notes (Signed)
Hgb. 10.1.  Order is to only give if below 10g.  No aranesp given.  Schedule and results given to patient.

## 2015-01-24 ENCOUNTER — Ambulatory Visit: Payer: Medicare Other | Admitting: Hematology and Oncology

## 2015-01-24 ENCOUNTER — Ambulatory Visit: Payer: Medicare Other

## 2015-01-24 ENCOUNTER — Other Ambulatory Visit: Payer: Medicare Other

## 2015-01-31 ENCOUNTER — Ambulatory Visit: Payer: Medicare Other

## 2015-01-31 ENCOUNTER — Other Ambulatory Visit: Payer: Medicare Other

## 2015-01-31 ENCOUNTER — Ambulatory Visit: Payer: Medicare Other | Admitting: Hematology and Oncology

## 2015-02-06 ENCOUNTER — Other Ambulatory Visit (HOSPITAL_BASED_OUTPATIENT_CLINIC_OR_DEPARTMENT_OTHER): Payer: Medicare Other

## 2015-02-06 ENCOUNTER — Telehealth: Payer: Self-pay | Admitting: Hematology and Oncology

## 2015-02-06 ENCOUNTER — Ambulatory Visit (HOSPITAL_BASED_OUTPATIENT_CLINIC_OR_DEPARTMENT_OTHER): Payer: Medicare Other | Admitting: Hematology and Oncology

## 2015-02-06 ENCOUNTER — Ambulatory Visit: Payer: Medicare Other

## 2015-02-06 ENCOUNTER — Encounter: Payer: Self-pay | Admitting: Hematology and Oncology

## 2015-02-06 VITALS — BP 129/68 | HR 70 | Temp 98.2°F | Resp 20 | Ht 59.0 in | Wt 145.9 lb

## 2015-02-06 DIAGNOSIS — Z8673 Personal history of transient ischemic attack (TIA), and cerebral infarction without residual deficits: Secondary | ICD-10-CM

## 2015-02-06 DIAGNOSIS — D469 Myelodysplastic syndrome, unspecified: Secondary | ICD-10-CM | POA: Diagnosis not present

## 2015-02-06 DIAGNOSIS — M6281 Muscle weakness (generalized): Secondary | ICD-10-CM | POA: Diagnosis not present

## 2015-02-06 LAB — CBC WITH DIFFERENTIAL/PLATELET
BASO%: 0.2 % (ref 0.0–2.0)
Basophils Absolute: 0 10*3/uL (ref 0.0–0.1)
EOS%: 1.7 % (ref 0.0–7.0)
Eosinophils Absolute: 0.2 10*3/uL (ref 0.0–0.5)
HCT: 30.5 % — ABNORMAL LOW (ref 34.8–46.6)
HEMOGLOBIN: 10.4 g/dL — AB (ref 11.6–15.9)
LYMPH%: 12.7 % — AB (ref 14.0–49.7)
MCH: 28.9 pg (ref 25.1–34.0)
MCHC: 34.1 g/dL (ref 31.5–36.0)
MCV: 84.7 fL (ref 79.5–101.0)
MONO#: 0.7 10*3/uL (ref 0.1–0.9)
MONO%: 7.3 % (ref 0.0–14.0)
NEUT#: 8 10*3/uL — ABNORMAL HIGH (ref 1.5–6.5)
NEUT%: 78.1 % — AB (ref 38.4–76.8)
Platelets: 348 10*3/uL (ref 145–400)
RBC: 3.6 10*6/uL — ABNORMAL LOW (ref 3.70–5.45)
RDW: 16.7 % — AB (ref 11.2–14.5)
WBC: 10.2 10*3/uL (ref 3.9–10.3)
lymph#: 1.3 10*3/uL (ref 0.9–3.3)

## 2015-02-06 NOTE — Telephone Encounter (Signed)
gv transportation aide avs and appts for aug. per NG no more injs - only one inj w/new visit in aug.

## 2015-02-06 NOTE — Progress Notes (Signed)
[facility] OFFICE PROGRESS NOTE  Patient Care Team: Marletta Lor, MD as PCP - General (Internal Medicine) Milus Banister, MD as Consulting Physician (Gastroenterology) Heath Lark, MD as Consulting Physician (Hematology and Oncology)  SUMMARY OF ONCOLOGIC HISTORY:  I reviewed the patient's records extensive and collaborated the history with the patient. Summary of her history is as follows: This is a pleasant lady with a myeloproliferative disorder with dysplastic features. She was diagnosed in May of 2008 when she presented with unexplained normochromic anemia. Bone marrow aspiration and biopsy was done on 04/23/2007. Marrow was hypercellular for age 79-80% Maturation was normal in all 3 cell lines and there were only 1% blasts. Cytogenetics were normal including FISH studies to look at chromosome 5 and chromosome 7 deletions which were not present. Hemoglobin was 9.7 at the time with MCV 90, [place] count 4700, and platelets 162,000. There were no ringed sideroblasts.  She was initially followed with observation alone until hemoglobin fell to 8.6 by June of 2010. She was started on a trial of Aranesp and had a nice response with rise in hemoglobin up to 12.5 g. She is currently on a dose of 300 mcg every 4 weeks and we have been able to maintain her hemoglobin at 10.  INTERVAL HISTORY: The patient is noted to have dementia. On review of her medication list, it was noted she is on Aricept. The patient denies any recent signs or symptoms of bleeding such as spontaneous epistaxis, hematuria or hematochezia. She denies any symptoms of anemia such as chest pain, shortness of breath or dizziness. She has not received any injection for many months as her hemoglobin has been elevated at greater than 10 g. She is not symptomatic.  REVIEW OF SYSTEMS:   Constitutional: Denies fevers, chills or abnormal weight loss Eyes: Denies blurriness of vision Ears, nose, mouth, throat, and  face: Denies mucositis or sore throat Respiratory: Denies cough, dyspnea or wheezes Cardiovascular: Denies palpitation, chest discomfort or lower extremity swelling Gastrointestinal:  Denies nausea, heartburn or change in bowel habits Skin: Denies abnormal skin rashes Lymphatics: Denies new lymphadenopathy or easy bruising Neurological:Denies numbness, tingling or new weaknesses Behavioral/Psych: Mood is stable, no new changes  All other systems were reviewed with the patient and are negative.  I have reviewed the past medical history, past surgical history, social history and family history with the patient and they are unchanged from previous note.  ALLERGIES:  is allergic to lisinopril and penicillins.  MEDICATIONS:  Current Outpatient Prescriptions  Medication Sig Dispense Refill  . acetaminophen (TYLENOL) 500 MG tablet 500 mg 3 times daily 30 tablet   . Ascorbic Acid (VITAMIN C) 500 MG tablet Take 500 mg by mouth daily.      Marland Kitchen aspirin EC 81 MG tablet Take 81 mg by mouth daily.    . Calcium Carbonate-Vit D-Min (CALTRATE 600+D PLUS) 600-400 MG-UNIT per tablet Take 1 tablet by mouth daily.      . Cholecalciferol (VITAMIN D-3 PO) Take by mouth daily.    . cycloSPORINE (RESTASIS) 0.05 % ophthalmic emulsion Place 1 drop into both eyes 2 (two) times daily.      Marland Kitchen donepezil (ARICEPT) 10 MG tablet Take 10 mg by mouth daily.  11  . levothyroxine (SYNTHROID, LEVOTHROID) 88 MCG tablet TAKE 1 TABLET BY MOUTH ONCE DAILY 90 tablet 1  . losartan (COZAAR) 100 MG tablet TAKE 1 TABLET EVERY DAY FOR BLOOD PRESSURE 30 tablet 5  . Multiple Vitamin (MULTIVITAMIN WITH MINERALS)  TABS Take 1 tablet by mouth daily.    . polyethylene glycol powder (GLYCOLAX/MIRALAX) powder Take 17 g by mouth daily as needed.     No current facility-administered medications for this visit.    PHYSICAL EXAMINATION: ECOG PERFORMANCE STATUS: 0 - Asymptomatic  Filed Vitals:   02/06/15 1245  BP: 129/68  Pulse: 70  Temp:  98.2 F (36.8 C)  Resp: 20   Filed Weights   02/06/15 1245  Weight: 145 lb 14.4 oz (66.18 kg)    GENERAL:alert, no distress and comfortable SKIN: skin color, texture, turgor are normal, no rashes or significant lesions EYES: normal, Conjunctiva are pink and non-injected, sclera clear Musculoskeletal:no cyanosis of digits and no clubbing  NEURO: alert & oriented x 3 with fluent speech, no focal motor/sensory deficits  LABORATORY DATA:  I have reviewed the data as listed    Component Value Date/Time   NA 140 09/29/2014 1644   NA 141 04/20/2013 1320   K 5.3* 09/29/2014 1644   K 4.4 04/20/2013 1320   CL 108 09/29/2014 1644   CL 109* 04/20/2013 1320   CO2 19 09/29/2014 1644   CO2 24 04/20/2013 1320   GLUCOSE 95 09/29/2014 1644   GLUCOSE 129* 04/20/2013 1320   BUN 26* 09/29/2014 1644   BUN 27.4* 04/20/2013 1320   CREATININE 1.1 09/29/2014 1644   CREATININE 1.1 04/20/2013 1320   CALCIUM 9.9 09/29/2014 1644   CALCIUM 9.1 04/20/2013 1320   PROT 7.1 09/29/2014 1644   PROT 6.5 04/20/2013 1320   ALBUMIN 3.8 09/29/2014 1644   ALBUMIN 3.6 04/20/2013 1320   AST 18 09/29/2014 1644   AST 14 04/20/2013 1320   ALT 16 09/29/2014 1644   ALT 13 04/20/2013 1320   ALKPHOS 62 09/29/2014 1644   ALKPHOS 74 04/20/2013 1320   BILITOT 0.8 09/29/2014 1644   BILITOT 0.85 04/20/2013 1320   GFRNONAA 45* 07/09/2012 0355   GFRAA 52* 07/09/2012 0355    No results found for: SPEP, UPEP  Lab Results  Component Value Date   WBC 10.2 02/06/2015   NEUTROABS 8.0* 02/06/2015   HGB 10.4* 02/06/2015   HCT 30.5* 02/06/2015   MCV 84.7 02/06/2015   PLT 348 02/06/2015      Chemistry      Component Value Date/Time   NA 140 09/29/2014 1644   NA 141 04/20/2013 1320   K 5.3* 09/29/2014 1644   K 4.4 04/20/2013 1320   CL 108 09/29/2014 1644   CL 109* 04/20/2013 1320   CO2 19 09/29/2014 1644   CO2 24 04/20/2013 1320   BUN 26* 09/29/2014 1644   BUN 27.4* 04/20/2013 1320   CREATININE 1.1 09/29/2014  1644   CREATININE 1.1 04/20/2013 1320      Component Value Date/Time   CALCIUM 9.9 09/29/2014 1644   CALCIUM 9.1 04/20/2013 1320   ALKPHOS 62 09/29/2014 1644   ALKPHOS 74 04/20/2013 1320   AST 18 09/29/2014 1644   AST 14 04/20/2013 1320   ALT 16 09/29/2014 1644   ALT 13 04/20/2013 1320   BILITOT 0.8 09/29/2014 1644   BILITOT 0.85 04/20/2013 1320      ASSESSMENT & PLAN:  Myelodysplastic syndrome Due to history of stroke, I recommend giving her injection only to keep hemoglobin greater than 10 g. Her hemoglobin has been stable over the last 6 months. I will space of her appointment to every 6 months. She does not require treatment now.      Orders Placed This Encounter  Procedures  .  CBC & Diff and Retic    Standing Status: Future     Number of Occurrences:      Standing Expiration Date: 03/12/2016   All questions were answered. The patient knows to call the clinic with any problems, questions or concerns. No barriers to learning was detected. I spent 15 minutes counseling the patient face to face. The total time spent in the appointment was 20 minutes and more than 50% was on counseling and review of test results     [facility], [place], MD 02/06/2015 1:05 PM

## 2015-02-06 NOTE — Assessment & Plan Note (Signed)
Due to history of stroke, I recommend giving her injection only to keep hemoglobin greater than 10 g. Her hemoglobin has been stable over the last 6 months. I will space of her appointment to every 6 months. She does not require treatment now.

## 2015-02-07 DIAGNOSIS — L821 Other seborrheic keratosis: Secondary | ICD-10-CM | POA: Diagnosis not present

## 2015-02-07 DIAGNOSIS — C4330 Malignant melanoma of unspecified part of face: Secondary | ICD-10-CM | POA: Diagnosis not present

## 2015-02-07 DIAGNOSIS — D0339 Melanoma in situ of other parts of face: Secondary | ICD-10-CM | POA: Diagnosis not present

## 2015-02-07 DIAGNOSIS — D239 Other benign neoplasm of skin, unspecified: Secondary | ICD-10-CM | POA: Diagnosis not present

## 2015-02-20 ENCOUNTER — Telehealth: Payer: Self-pay | Admitting: Internal Medicine

## 2015-02-20 NOTE — Telephone Encounter (Signed)
[facility] Night - Client TELEPHONE [facility] Patient Name: Brittany [name] Gender: Female DOB: 1924/07/07 Age: 81 Y 60 M 26 D Return Phone Number: [phone] (Primary), [phone] (Secondary) Address: City/State/Zip: [place] Client [facility] Night - Client Client Site [facility] - Night Physician Simonne Martinet Contact Type Call Call Type Triage / Clinical Caller Name Nicolaus Relationship To Patient Care Giver Return Phone Number [phone] (Primary) Chief Complaint Vomiting Initial Comment Caller at [facility] facility. Had nausea and vomiting overnight, wants to know about getting order for zofran. Fax number [phone] Nurse Assessment Nurse: Justine Null, RN, Rodena Piety Date/Time (Eastern Time): 02/19/2015 10:46:32 AM Confirm and document reason for call. If symptomatic, describe symptoms. ---Caller at [facility] facility. Had nausea and vomiting overnight, wants to know about getting order for zofran. Fax number [phone] caller stated that she has vomited X3 since last night and has low grade fevers of 100.0 per ear and has had no diarrhea and has been able to urinate last time was at about 0900 AM today and has had no stomach cramps and no blood in any BM Has the patient traveled out of the country within the last 30 days? ---No Does the patient require triage? ---Declined Triage Please document clinical information provided and list any resource used. ---when triager reached the caller she stated that she needed to have an order for Zofran allergies PCN Guidelines Guideline Title Affirmed Question Affirmed Notes Nurse Date/Time ([place] Time) Disp. Time Eilene Ghazi Time) Disposition Final User 02/19/2015 10:54:44 AM Clinical Call Yes Justine Null, RN, Rodena Piety After Care Instructions Given Call Event Type User Date / Time Description PLEASE NOTE: All timestamps contained within  this report are represented as [place] Standard Time. CONFIDENTIALTY NOTICE: This fax transmission is intended only for the addressee. It contains information that is legally privileged, confidential or otherwise protected from use or disclosure. If you are not the intended recipient, you are strictly prohibited from reviewing, disclosing, copying using or disseminating any of this information or taking any action in reliance on or regarding this information. If you have received this fax in error, please notify us immediately by telephone so that we can arrange for its return to [place]. Phone: [phone], Toll-Free: [phone], Fax: [phone] Page: 2 of 3 Call Id: [id number] Standing Orders Preparation Additional Instructions Route Frequency Duration Nurse Comments User Name Zofran 4mg  (regular tablet or ODT) 1-2 tablets as needed, # 6 Oral Every 8 Hours Rowe, RN, Rodena Piety Comments User: Charmayne Sheer, RN Date/Time Eilene Ghazi Time): 02/19/2015 10:54:31 AM call placed to the caller to notify her of the order for zofran 4 mg 1 tab po q 8 hours as needed for nausea or vomiting per the regular MD Verbalized understanding

## 2015-02-20 NOTE — Telephone Encounter (Signed)
FYI

## 2015-02-23 ENCOUNTER — Telehealth: Payer: Self-pay | Admitting: Internal Medicine

## 2015-02-23 NOTE — Telephone Encounter (Signed)
Please see message. °

## 2015-02-23 NOTE — Telephone Encounter (Signed)
Pt son Quaniece Mcferrin called to ask for a statement for social security to say that patient is not able to manage her finances. He is his mother POA.    Antony Haste phone number [phone]    Fax number [place]

## 2015-02-23 NOTE — Telephone Encounter (Signed)
Ok due to cognitive impairment

## 2015-02-24 NOTE — Telephone Encounter (Signed)
Spoke to pt's son Antony Haste, told him letter was prepared and faxed to Gordy Levan at [employer]. Antony Haste verbalized understanding. Letter faxed.

## 2015-03-03 NOTE — Telephone Encounter (Signed)
Patient's son Antony Haste is requesting the below mentioned letter e-mailed to him.  His e-mail address is:  [email or web address] He states he needs to send it to [employer].

## 2015-03-06 NOTE — Telephone Encounter (Signed)
Letter sent to pt's e-mail.

## 2015-03-20 DIAGNOSIS — L905 Scar conditions and fibrosis of skin: Secondary | ICD-10-CM | POA: Diagnosis not present

## 2015-03-20 DIAGNOSIS — D485 Neoplasm of uncertain behavior of skin: Secondary | ICD-10-CM | POA: Diagnosis not present

## 2015-03-20 DIAGNOSIS — D0339 Melanoma in situ of other parts of face: Secondary | ICD-10-CM | POA: Diagnosis not present

## 2015-03-20 DIAGNOSIS — Z85820 Personal history of malignant melanoma of skin: Secondary | ICD-10-CM | POA: Insufficient documentation

## 2015-04-06 ENCOUNTER — Non-Acute Institutional Stay: Payer: Medicare Other | Admitting: Nurse Practitioner

## 2015-04-06 VITALS — BP 106/60 | HR 68 | Temp 97.9°F | Ht 59.0 in | Wt 143.0 lb

## 2015-04-06 DIAGNOSIS — I10 Essential (primary) hypertension: Secondary | ICD-10-CM | POA: Diagnosis not present

## 2015-04-06 DIAGNOSIS — M5440 Lumbago with sciatica, unspecified side: Secondary | ICD-10-CM

## 2015-04-06 DIAGNOSIS — E119 Type 2 diabetes mellitus without complications: Secondary | ICD-10-CM

## 2015-04-06 DIAGNOSIS — E039 Hypothyroidism, unspecified: Secondary | ICD-10-CM

## 2015-04-06 DIAGNOSIS — D469 Myelodysplastic syndrome, unspecified: Secondary | ICD-10-CM | POA: Diagnosis not present

## 2015-04-06 DIAGNOSIS — F0390 Unspecified dementia without behavioral disturbance: Secondary | ICD-10-CM | POA: Diagnosis not present

## 2015-04-06 DIAGNOSIS — K5900 Constipation, unspecified: Secondary | ICD-10-CM | POA: Diagnosis not present

## 2015-04-06 NOTE — Assessment & Plan Note (Signed)
MDS-update CBC

## 2015-04-06 NOTE — Progress Notes (Signed)
Patient ID: Brittany [name], female   DOB: 28-Jul-1924, 81 y.o.   MRN: [id number]    Code Status: living will, HCPOA, family will provide copy  Allergies  Allergen Reactions  . Lisinopril     REACTION: angioedema  . Penicillins     REACTION: rash    Chief Complaint  Patient presents with  . Medical Management of Chronic Issues    New Patient here with daughter-in-law    HPI: Patient is a 81 y.o. female seen in the clinic at [facility] today for new patient establishment and other chronic medical conditions.  Problem List Items Addressed This Visit    Myelodysplastic syndrome    MDS-update CBC      Hypothyroidism    Takes Levothyroxine 1mcg daily. Update TSH, CBC, and CMP      Diabetes mellitus without complication    Diet control presently, update Hgb A1c      Essential hypertension    Controlled, takes Losartan 100mg  daily.       Back pain    On and off-prn Tylenol is adequate for her back pain. Ambulates with walker independently.       Dementia - Primary    Seen by neurology at [facility] in November 2013. MRI revealed multi-infarct dementia. Aricept prescribed Obtain MMSE 25/30 failed clock drawing today.        Constipation    Stable, diet and prn daily Miralax to ensure regular BM         Review of Systems:  Review of Systems  Constitutional: Negative.   HENT: Negative for congestion, dental problem, hearing loss, rhinorrhea, sinus pressure, sore throat and tinnitus.   Eyes: Negative for pain, discharge and visual disturbance.  Respiratory: Positive for cough. Negative for shortness of breath.   Cardiovascular: Negative for chest pain, palpitations and leg swelling.  Gastrointestinal: Positive for constipation. Negative for nausea, vomiting, abdominal pain, diarrhea, blood in stool and abdominal distention.  Genitourinary: Negative for dysuria, urgency, frequency, hematuria, flank pain, vaginal bleeding, vaginal discharge, difficulty  urinating, vaginal pain and pelvic pain.  Musculoskeletal: Positive for back pain and gait problem. Negative for joint swelling and arthralgias.  Skin: Negative for rash.  Neurological: Negative for dizziness, syncope, speech difficulty, weakness, numbness and headaches.  Hematological: Negative for adenopathy.  Psychiatric/Behavioral: Positive for confusion. Negative for behavioral problems, dysphoric mood and agitation. The patient is not nervous/anxious.       Past Medical History  Diagnosis Date  . Melanoma     x2  . Diabetes mellitus   . Diverticulosis of colon   . Hyperlipidemia   . Hypothyroidism   . Osteoporosis   . Blood transfusion 2006  . Myeloproliferative disorder 02/04/2012  . Normochromic normocytic anemia 02/04/2012  . Hypertension   . Osteopenia   . Colon polyps   . DJD (degenerative joint disease)   . Rectal prolapse   . CVA (cerebral infarction) 2013   Past Surgical History  Procedure Laterality Date  . Polypectomy  2004    Laparoscopic  . Appendectomy    . Carpal tunnel release Bilateral 2006  . Melanoma excision Right 2005    arm  . Replacement total knee  2005 & 2006  . Rectocele repair    . Vaginal hysterectomy  1989  . Flexible sigmoidoscopy  07/09/2012    Procedure: FLEXIBLE SIGMOIDOSCOPY;  Surgeon: Inda Castle, MD;  Location: [facility];  Service: Endoscopy;  Laterality: N/A;   Social History:   reports that she has  never smoked. She has never used smokeless tobacco. She reports that she does not drink alcohol or use illicit drugs.  Family History  Problem Relation Age of Onset  . Prostate cancer Brother   . Cancer Brother     renal cell carcinoma  . Heart disease Brother   . Breast cancer Mother 42  . Cancer Father     unknown kind    Medications: Patient's Medications  New Prescriptions   No medications on file  Previous Medications   ACETAMINOPHEN (TYLENOL) 500 MG TABLET    500 mg one every 8 hours as needed   ASPIRIN EC  81 MG TABLET    Take 81 mg by mouth daily.   CALCIUM CARBONATE-VIT D-MIN (CALTRATE 600+D PLUS) 600-400 MG-UNIT PER TABLET    Take 1 tablet by mouth daily.     CHOLECALCIFEROL (VITAMIN D-3 PO)    Take by mouth. 1,000mg  one daily   CYCLOSPORINE (RESTASIS) 0.05 % OPHTHALMIC EMULSION    Place 1 drop into both eyes 2 (two) times daily.     DONEPEZIL (ARICEPT) 10 MG TABLET    Take 10 mg by mouth daily.   LEVOTHYROXINE (SYNTHROID, LEVOTHROID) 88 MCG TABLET    TAKE 1 TABLET BY MOUTH ONCE DAILY   LOSARTAN (COZAAR) 100 MG TABLET    TAKE 1 TABLET EVERY DAY FOR BLOOD PRESSURE   MULTIPLE VITAMIN (MULTIVITAMIN WITH MINERALS) TABS    Take 1 tablet by mouth daily.   ONDANSETRON (ZOFRAN) 4 MG TABLET    Take 4 mg by mouth. One every 8 hours as needed for nausea   POLYETHYLENE GLYCOL POWDER (GLYCOLAX/MIRALAX) POWDER    Take 17 g by mouth daily as needed.   VITAMIN B-12 (CYANOCOBALAMIN) 1000 MCG TABLET    Take 1,000 mcg by mouth daily.  Modified Medications   No medications on file  Discontinued Medications   ASCORBIC ACID (VITAMIN C) 500 MG TABLET    Take 500 mg by mouth daily.       Physical Exam: Physical Exam  Constitutional: She is oriented to person, place, and time. She appears well-developed and well-nourished.  Blood pressure low normal  HENT:  Head: Normocephalic and atraumatic.  Right Ear: External ear normal.  Left Ear: External ear normal.  Mouth/Throat: Oropharynx is clear and moist.  Eyes: Conjunctivae and EOM are normal.  Neck: Normal range of motion. Neck supple. No JVD present. No thyromegaly present.  Cardiovascular: Normal rate, regular rhythm, normal heart sounds and intact distal pulses.   No murmur heard. Pulmonary/Chest: Effort normal and breath sounds normal. She has no wheezes. She has no rales.  Abdominal: Soft. Bowel sounds are normal. She exhibits no distension and no mass. There is no tenderness. There is no rebound and no guarding.  Lower midline scar  Genitourinary:  Vagina normal. Guaiac negative stool.  Musculoskeletal: Normal range of motion. She exhibits no edema or tenderness.  Bilateral total knee replacement scars  Neurological: She is alert and oriented to person, place, and time. She has normal reflexes. No cranial nerve deficit. She exhibits normal muscle tone. Coordination normal.  Skin: Skin is warm and dry. No rash noted.  Psychiatric: She has a normal mood and affect. Her behavior is normal.  Mild confusion    Filed Vitals:   04/06/15 1602  BP: 106/60  Pulse: 68  Temp: 97.9 F (36.6 C)  TempSrc: Oral  Height: 4\' 11"  (1.499 m)  Weight: 143 lb (64.864 kg)  SpO2: 97%  Labs reviewed: Basic Metabolic Panel:  Recent Labs  09/29/14 1644  NA 140  K 5.3*  CL 108  CO2 19  GLUCOSE 95  BUN 26*  CREATININE 1.1  CALCIUM 9.9  TSH 0.51   Liver Function Tests:  Recent Labs  09/29/14 1644  AST 18  ALT 16  ALKPHOS 62  BILITOT 0.8  PROT 7.1  ALBUMIN 3.8   No results for input(s): LIPASE, AMYLASE in the last 8760 hours. No results for input(s): AMMONIA in the last 8760 hours. CBC:  Recent Labs  11/29/14 1218 01/03/15 1333 02/06/15 1227  WBC 8.3 8.4 10.2  NEUTROABS 5.7 6.2 8.0*  HGB 10.4* 10.1* 10.4*  HCT 32.0* 31.2* 30.5*  MCV 85.2 85.1 84.7  PLT 297 296 348   Lipid Panel: No results for input(s): CHOL, HDL, LDLCALC, TRIG, CHOLHDL, LDLDIRECT in the last 8760 hours. Anemia Panel:  Recent Labs  08/09/14 1230  IRON 38*    Past Procedures:  12/02/14 mammography: negative and f/u 1 year.   Assessment/Plan Dementia Seen by neurology at [facility] in November 2013. MRI revealed multi-infarct dementia. Aricept prescribed Obtain MMSE 25/30 failed clock drawing today.     Hypothyroidism Takes Levothyroxine 13mcg daily. Update TSH, CBC, and CMP   Myelodysplastic syndrome MDS-update CBC   Essential hypertension Controlled, takes Losartan 100mg  daily.    Constipation Stable, diet and prn  daily Miralax to ensure regular BM   Diabetes mellitus without complication Diet control presently, update Hgb A1c   Back pain On and off-prn Tylenol is adequate for her back pain. Ambulates with walker independently.      Family/ Staff Communication: observe the patient.   Goals of Care: AL  Labs/tests ordered: CBC, CMP, TSH, Hgb A1c, MMSE

## 2015-04-06 NOTE — Assessment & Plan Note (Signed)
Controlled, takes Losartan 100mg  daily.

## 2015-04-06 NOTE — Assessment & Plan Note (Signed)
Stable, diet and prn daily Miralax to ensure regular BM 

## 2015-04-06 NOTE — Assessment & Plan Note (Signed)
Diet control presently, update Hgb A1c

## 2015-04-06 NOTE — Assessment & Plan Note (Signed)
On and off-prn Tylenol is adequate for her back pain. Ambulates with walker independently.   

## 2015-04-06 NOTE — Assessment & Plan Note (Addendum)
Seen by neurology at [facility] in November 2013. MRI revealed multi-infarct dementia. Aricept prescribed Obtain MMSE 25/30 failed clock drawing today.

## 2015-04-06 NOTE — Progress Notes (Signed)
Failed clock drawing  

## 2015-04-06 NOTE — Assessment & Plan Note (Signed)
Takes Levothyroxine 36mcg daily. Update TSH, CBC, and CMP

## 2015-04-11 DIAGNOSIS — E119 Type 2 diabetes mellitus without complications: Secondary | ICD-10-CM | POA: Diagnosis not present

## 2015-04-11 DIAGNOSIS — I10 Essential (primary) hypertension: Secondary | ICD-10-CM | POA: Diagnosis not present

## 2015-04-11 DIAGNOSIS — E039 Hypothyroidism, unspecified: Secondary | ICD-10-CM | POA: Diagnosis not present

## 2015-04-11 LAB — BASIC METABOLIC PANEL
BUN: 29 mg/dL — AB (ref 4–21)
CREATININE: 1.1 mg/dL (ref 0.5–1.1)
Glucose: 87 mg/dL
POTASSIUM: 4.7 mmol/L (ref 3.4–5.3)
Sodium: 140 mmol/L (ref 137–147)

## 2015-04-11 LAB — HEMOGLOBIN A1C: Hgb A1c MFr Bld: 5.6 % (ref 4.0–6.0)

## 2015-04-11 LAB — HEPATIC FUNCTION PANEL
ALK PHOS: 69 U/L (ref 25–125)
ALT: 20 U/L (ref 7–35)
AST: 15 U/L (ref 13–35)

## 2015-04-11 LAB — TSH: TSH: 2.11 u[IU]/mL (ref 0.41–5.90)

## 2015-04-13 ENCOUNTER — Other Ambulatory Visit: Payer: Self-pay | Admitting: Nurse Practitioner

## 2015-04-13 DIAGNOSIS — E119 Type 2 diabetes mellitus without complications: Secondary | ICD-10-CM

## 2015-04-13 DIAGNOSIS — E039 Hypothyroidism, unspecified: Secondary | ICD-10-CM

## 2015-05-04 DIAGNOSIS — H2513 Age-related nuclear cataract, bilateral: Secondary | ICD-10-CM | POA: Diagnosis not present

## 2015-05-04 DIAGNOSIS — H11123 Conjunctival concretions, bilateral: Secondary | ICD-10-CM | POA: Diagnosis not present

## 2015-05-26 ENCOUNTER — Other Ambulatory Visit: Payer: Self-pay | Admitting: Hematology and Oncology

## 2015-06-26 DIAGNOSIS — N2889 Other specified disorders of kidney and ureter: Secondary | ICD-10-CM | POA: Diagnosis not present

## 2015-07-06 ENCOUNTER — Encounter: Payer: Self-pay | Admitting: Internal Medicine

## 2015-07-06 ENCOUNTER — Non-Acute Institutional Stay: Payer: Medicare Other | Admitting: Internal Medicine

## 2015-07-06 VITALS — BP 122/60 | HR 76 | Temp 97.8°F | Resp 14 | Ht 59.0 in | Wt 147.0 lb

## 2015-07-06 DIAGNOSIS — R2681 Unsteadiness on feet: Secondary | ICD-10-CM | POA: Diagnosis not present

## 2015-07-06 DIAGNOSIS — C439 Malignant melanoma of skin, unspecified: Secondary | ICD-10-CM | POA: Diagnosis not present

## 2015-07-06 DIAGNOSIS — E039 Hypothyroidism, unspecified: Secondary | ICD-10-CM

## 2015-07-06 DIAGNOSIS — D469 Myelodysplastic syndrome, unspecified: Secondary | ICD-10-CM

## 2015-07-06 DIAGNOSIS — F0390 Unspecified dementia without behavioral disturbance: Secondary | ICD-10-CM | POA: Diagnosis not present

## 2015-07-06 DIAGNOSIS — I10 Essential (primary) hypertension: Secondary | ICD-10-CM

## 2015-07-06 DIAGNOSIS — M25511 Pain in right shoulder: Secondary | ICD-10-CM

## 2015-07-06 DIAGNOSIS — M25512 Pain in left shoulder: Secondary | ICD-10-CM

## 2015-07-06 DIAGNOSIS — E785 Hyperlipidemia, unspecified: Secondary | ICD-10-CM

## 2015-07-06 NOTE — Progress Notes (Signed)
Patient ID: Brittany [name], female   DOB: 04/09/1924, 81 y.o.   MRN: [id number]    HISTORY AND PHYSICAL  Location:  [place] of Service: Clinic (12)   Extended Emergency Contact Information Primary Emergency Contact: Loughney,George Address: [address]          [place], [place] [address] [place] Phone: [phone] Relation: Spouse Secondary Emergency Contact: [facility] Address: [address]          [place], [place] [address] [name] of [place] Phone: [phone] Work Phone: [phone] Mobile Phone: [phone] Relation: Son  Advanced Directive information Does patient have an advance directive?: Yes, Type of Advance Directive: Living will;Out of facility DNR (pink MOST or yellow form)  Chief Complaint  Patient presents with  . Medical Management of Chronic Issues    dementia, thyroid, blood pressure, blood sugar. Here with son Antony Haste  . shoulders    limited ROM, wonder about PTa  . sleep    per son, it's hard to get her up in morning. Sleeping until 10:00 to 12:00. Goes to bed about 11:00 or 12:00    HPI:  Myelodysplastic syndrome: Chronic problem followed by hematologist, Dr. Heath Lark. Chronic anemia appears to be reasonably stable. CBC on 02/06/2015 showed hemoglobin 10.4, WBC 10,200, and platelets 348,000. Follow-up visit scheduled with hematologist 08/08/15.  Dementia, without behavioral disturbance: Currently taking donepezil. Memory loss loss is slow.  Melanoma: Right chin and neck. Removed by Mohs surgery in April 2016.  Essential hypertension: Controlled  Hyperlipidemia: No recent lab  Hypothyroidism, unspecified hypothyroidism type: Taking levothyroxine. Lab on 09/29/2014 showed normal TSH at 0.51.  Unstable gait: High risk for falling  Bilateral shoulder pain: Chronic issue. Limited range of motion of the shoulders. May benefit from physical therapy to extend range of motion and for  strengthening.    Past Medical History  Diagnosis Date  . Melanoma 03/20/15    , removed with Moh's surgery  . Diverticulosis of colon   . Hyperlipidemia   . Hypothyroidism   . Osteoporosis   . Blood transfusion 2006  . Myelodysplasia 02/04/2012  . Normochromic normocytic anemia 02/04/2012  . Hypertension   . Colon polyps   . DJD (degenerative joint disease)   . Rectal prolapse   . CVA (cerebral infarction) 2013  . Dementia   . Onychomycosis   . Unstable gait   . Shoulder pain     Past Surgical History  Procedure Laterality Date  . Polypectomy  2004    Laparoscopic  . Appendectomy    . Carpal tunnel release Bilateral 2006  . Melanoma excision Right 2005    arm  . Replacement total knee  2005 & 2006  . Rectocele repair    . Vaginal hysterectomy  1989  . Flexible sigmoidoscopy  07/09/2012    Procedure: FLEXIBLE SIGMOIDOSCOPY;  Surgeon: Inda Castle, MD;  Location: [facility];  Service: Endoscopy;  Laterality: N/A;    Patient Care Team: Estill Dooms, MD as PCP - General (Internal Medicine) Milus Banister, MD as Consulting Physician (Gastroenterology) Heath Lark, MD as Consulting Physician (Hematology and Oncology)  History   Social History  . Marital Status: Married    Spouse Name: N/A  . Number of Children: N/A  . Years of Education: N/A   Occupational History  . piano teacher    Social History Main Topics  . Smoking status: Never Smoker   . Smokeless tobacco: Never Used  .  Alcohol Use: No  . Drug Use: No  . Sexual Activity: Yes    Birth Control/ Protection: Surgical     Comment: HYST   Other Topics Concern  . Not on file   Social History Narrative   Lives at [facility], moved to [place] 12/28/14   Widowed   Never smoked   Alcohol none   Caffeine Coffee 1 cup   Exercise none        reports that she has never smoked. She has never used smokeless tobacco. She reports that she does not drink alcohol or use illicit drugs.  Family  History  Problem Relation Age of Onset  . Prostate cancer Brother   . Cancer Brother     renal cell carcinoma  . Heart disease Brother   . Breast cancer Mother 35  . Cancer Father     unknown kind   Family Status  Relation Status Death Age  . Brother Deceased 64  . Mother Deceased 21  . Father Deceased 68    Immunization History  Administered Date(s) Administered  . Influenza Split 10/17/2011, 09/14/2012  . Influenza Whole 10/17/2007  . Influenza, High Dose Seasonal PF 10/11/2013, 10/05/2014  . Pneumococcal Conjugate-13 09/29/2014  . Pneumococcal Polysaccharide-23 12/16/1998  . Tdap 04/23/2011  . Zoster 07/04/2006    Allergies  Allergen Reactions  . Lisinopril     REACTION: angioedema  . Penicillins     REACTION: rash    Medications: Patient's Medications  New Prescriptions   No medications on file  Previous Medications   ACETAMINOPHEN (TYLENOL) 500 MG TABLET    500 mg one every 8 hours as needed   ASPIRIN EC 81 MG TABLET    Take 81 mg by mouth daily.   CALCIUM CARBONATE-VIT D-MIN (CALTRATE 600+D PLUS) 600-400 MG-UNIT PER TABLET    Take 1 tablet by mouth daily.     CHOLECALCIFEROL (VITAMIN D-3 PO)    Take by mouth. 1,000mg  one daily   CYCLOSPORINE (RESTASIS) 0.05 % OPHTHALMIC EMULSION    Place 1 drop into both eyes 2 (two) times daily.     DONEPEZIL (ARICEPT) 10 MG TABLET    Take 10 mg by mouth daily.   LEVOTHYROXINE (SYNTHROID, LEVOTHROID) 88 MCG TABLET    TAKE 1 TABLET BY MOUTH ONCE DAILY   LOSARTAN (COZAAR) 100 MG TABLET    TAKE 1 TABLET EVERY DAY FOR BLOOD PRESSURE   MULTIPLE VITAMIN (MULTIVITAMIN WITH MINERALS) TABS    Take 1 tablet by mouth daily.   ONDANSETRON (ZOFRAN) 4 MG TABLET    Take 4 mg by mouth. One every 8 hours as needed for nausea   POLYETHYLENE GLYCOL POWDER (GLYCOLAX/MIRALAX) POWDER    Take 17 g by mouth daily as needed.   VITAMIN B-12 (CYANOCOBALAMIN) 1000 MCG TABLET    Take 1,000 mcg by mouth daily.  Modified Medications   No medications on  file  Discontinued Medications   No medications on file    Review of Systems  Constitutional: Negative.   HENT: Negative for congestion, dental problem, hearing loss, rhinorrhea, sinus pressure, sore throat and tinnitus.   Eyes: Negative for pain, discharge and visual disturbance.  Respiratory: Positive for cough. Negative for shortness of breath.   Cardiovascular: Negative for chest pain, palpitations and leg swelling.  Gastrointestinal: Positive for constipation. Negative for nausea, vomiting, abdominal pain, diarrhea, blood in stool and abdominal distention.  Endocrine:       History of hypothyroidism  Genitourinary: Negative for dysuria, urgency,  frequency, hematuria, flank pain, vaginal bleeding, vaginal discharge, difficulty urinating, vaginal pain and pelvic pain.  Musculoskeletal: Positive for back pain and gait problem. Negative for joint swelling and arthralgias.       Bilateral shoulder discomfort and limited range of motion.  Skin: Negative for rash.       History of onychomycosis followed by Dr. Mallie Mussel. History melanoma of the right chin and neck. Removed by Moh's surgery for 03/20/15.  Neurological: Negative for dizziness, syncope, speech difficulty, weakness, numbness and headaches.       History of dementia. Currently on Aricept.  Hematological: Negative for adenopathy.       History myelodysplasia with anemia. Followed by Dr. Alvy Bimler.  Psychiatric/Behavioral: Positive for confusion and sleep disturbance (difficulty arousing from sleep in the morning. Hypersomnia.). Negative for behavioral problems, dysphoric mood and agitation. The patient is not nervous/anxious.     Filed Vitals:   07/06/15 1413  BP: 122/60  Pulse: 76  Temp: 97.8 F (36.6 C)  TempSrc: Oral  Resp: 14  Height: 4\' 11"  (1.499 m)  Weight: 147 lb (66.679 kg)  SpO2: 97%   Body mass index is 29.67 kg/(m^2).  Physical Exam  Constitutional: She is oriented to person, place, and time. She appears  well-developed and well-nourished.  Blood pressure low normal  HENT:  Head: Normocephalic and atraumatic.  Right Ear: External ear normal.  Left Ear: External ear normal.  Mouth/Throat: Oropharynx is clear and moist.  Eyes: Conjunctivae and EOM are normal.  Neck: Normal range of motion. Neck supple. No JVD present. No thyromegaly present.  Cardiovascular: Normal rate, regular rhythm, normal heart sounds and intact distal pulses.   No murmur heard. Pulmonary/Chest: Effort normal and breath sounds normal. She has no wheezes. She has no rales.  Abdominal: Soft. Bowel sounds are normal. She exhibits no distension and no mass. There is no tenderness. There is no rebound and no guarding.  Lower midline scar  Genitourinary: Vagina normal. Guaiac negative stool.  Musculoskeletal: Normal range of motion. She exhibits no edema or tenderness.  Bilateral total knee replacement scars. Unstable gait. Decreased range of motion at shoulders.  Neurological: She is alert and oriented to person, place, and time. She has normal reflexes. No cranial nerve deficit. She exhibits normal muscle tone. Coordination normal.  Skin: Skin is warm and dry. No rash noted.  Scar right chin and neck. Bilateral onychomycosis of the toes.  Psychiatric: She has a normal mood and affect. Her behavior is normal.  Mild confusion     Labs reviewed: Nursing Home on 07/06/2015  Component Date Value Ref Range Status  . HM Sigmoidoscopy 07/09/2012 proctitis   Final  Lab on 04/13/2015  Component Date Value Ref Range Status  . Glucose 04/11/2015 87   Final  . BUN 04/11/2015 29* 4 - 21 mg/dL Final  . Creatinine 04/11/2015 1.1  0.5 - 1.1 mg/dL Final  . Potassium 04/11/2015 4.7  3.4 - 5.3 mmol/L Final  . Sodium 04/11/2015 140  137 - 147 mmol/L Final  . Alkaline Phosphatase 04/11/2015 69  25 - 125 U/L Final  . ALT 04/11/2015 20  7 - 35 U/L Final  . AST 04/11/2015 15  13 - 35 U/L Final  . Hgb A1c MFr Bld 04/11/2015 5.6  4.0 -  6.0 % Final  . TSH 04/11/2015 2.11  0.41 - 5.90 uIU/mL Final    No results found.   Assessment/Plan  1. Myelodysplastic syndrome Stable. Continue with follow-up visits with Dr. Heath Lark.  2. Dementia, without behavioral disturbance Continue Aricept 10 mg daily.  3. Melanoma Removed from right chin April 2016. No evidence of relapse.  4. Essential hypertension Adequately controlled  5. Hyperlipidemia -Lipid panel, future  6. Hypothyroidism, unspecified hypothyroidism type -TSH, future  7. Unstable gait -Refer to physical therapy  8. Bilateral shoulder pain -Refer to physical therapy

## 2015-07-16 ENCOUNTER — Encounter: Payer: Self-pay | Admitting: Internal Medicine

## 2015-07-16 DIAGNOSIS — M25519 Pain in unspecified shoulder: Secondary | ICD-10-CM | POA: Insufficient documentation

## 2015-07-16 DIAGNOSIS — R2681 Unsteadiness on feet: Secondary | ICD-10-CM | POA: Insufficient documentation

## 2015-07-26 DIAGNOSIS — M25512 Pain in left shoulder: Secondary | ICD-10-CM | POA: Diagnosis not present

## 2015-07-26 DIAGNOSIS — R29898 Other symptoms and signs involving the musculoskeletal system: Secondary | ICD-10-CM | POA: Diagnosis not present

## 2015-07-26 DIAGNOSIS — M25511 Pain in right shoulder: Secondary | ICD-10-CM | POA: Diagnosis not present

## 2015-07-26 DIAGNOSIS — M6281 Muscle weakness (generalized): Secondary | ICD-10-CM | POA: Diagnosis not present

## 2015-08-08 ENCOUNTER — Telehealth: Payer: Self-pay | Admitting: Hematology and Oncology

## 2015-08-08 ENCOUNTER — Ambulatory Visit: Payer: Medicare Other

## 2015-08-08 ENCOUNTER — Other Ambulatory Visit (HOSPITAL_BASED_OUTPATIENT_CLINIC_OR_DEPARTMENT_OTHER): Payer: Medicare Other

## 2015-08-08 ENCOUNTER — Ambulatory Visit (HOSPITAL_BASED_OUTPATIENT_CLINIC_OR_DEPARTMENT_OTHER): Payer: Medicare Other | Admitting: Hematology and Oncology

## 2015-08-08 VITALS — BP 130/51 | HR 73 | Temp 97.9°F | Resp 18 | Ht 59.0 in | Wt 147.3 lb

## 2015-08-08 DIAGNOSIS — F0390 Unspecified dementia without behavioral disturbance: Secondary | ICD-10-CM

## 2015-08-08 DIAGNOSIS — D469 Myelodysplastic syndrome, unspecified: Secondary | ICD-10-CM | POA: Diagnosis not present

## 2015-08-08 DIAGNOSIS — Z85820 Personal history of malignant melanoma of skin: Secondary | ICD-10-CM | POA: Diagnosis not present

## 2015-08-08 LAB — CBC & DIFF AND RETIC
BASO%: 0.4 % (ref 0.0–2.0)
BASOS ABS: 0 10*3/uL (ref 0.0–0.1)
EOS ABS: 0.2 10*3/uL (ref 0.0–0.5)
EOS%: 2.3 % (ref 0.0–7.0)
HCT: 31 % — ABNORMAL LOW (ref 34.8–46.6)
HEMOGLOBIN: 10.3 g/dL — AB (ref 11.6–15.9)
IMMATURE RETIC FRACT: 4.4 % (ref 1.60–10.00)
LYMPH#: 1.7 10*3/uL (ref 0.9–3.3)
LYMPH%: 17.7 % (ref 14.0–49.7)
MCH: 28.3 pg (ref 25.1–34.0)
MCHC: 33.2 g/dL (ref 31.5–36.0)
MCV: 85.2 fL (ref 79.5–101.0)
MONO#: 0.8 10*3/uL (ref 0.1–0.9)
MONO%: 8.1 % (ref 0.0–14.0)
NEUT#: 6.8 10*3/uL — ABNORMAL HIGH (ref 1.5–6.5)
NEUT%: 71.5 % (ref 38.4–76.8)
PLATELETS: 358 10*3/uL (ref 145–400)
RBC: 3.64 10*6/uL — ABNORMAL LOW (ref 3.70–5.45)
RDW: 16.3 % — ABNORMAL HIGH (ref 11.2–14.5)
RETIC CT ABS: 74.62 10*3/uL (ref 33.70–90.70)
Retic %: 2.05 % (ref 0.70–2.10)
WBC: 9.5 10*3/uL (ref 3.9–10.3)

## 2015-08-08 NOTE — Telephone Encounter (Signed)
Gave adn pritned appt sched and avs for pt for Aug 2017

## 2015-08-09 ENCOUNTER — Encounter: Payer: Self-pay | Admitting: Hematology and Oncology

## 2015-08-09 NOTE — Assessment & Plan Note (Signed)
Due to history of stroke, I recommend giving her injection only to keep hemoglobin greater than 10 g. Her hemoglobin has been stable over the last 6 months. I will space of her appointment to once a year. She does not require treatment now. If her hemoglobin remains stable here, I will consider discharging her from the clinic

## 2015-08-09 NOTE — Assessment & Plan Note (Signed)
She denies skin lesions. I will defer to her dermatologist for further management/surveillance

## 2015-08-09 NOTE — Assessment & Plan Note (Signed)
The patient has dementia but stable without behavioral problems. She will continue medical management

## 2015-08-09 NOTE — Progress Notes (Signed)
[name] OFF[facility] PROGRESS NOTE  Patient Care Team: Estill Dooms, MD as PCP - General (Internal Medicine) Milus Banister, MD as Consulting Physician (Gastroenterology) Heath Lark, MD as Consulting Physician (Hematology and Oncology)  SUMMARY OF ONCOLOGIC HISTORY: I reviewed the patient's records extensive and collaborated the history with the patient. Summary of her history is as follows: This is a pleasant lady with a myeloproliferative disorder with dysplastic features. She was diagnosed in May of 2008 when she presented with unexplained normochromic anemia. Bone marrow aspiration and biopsy was done on 04/23/2007. Marrow was hypercellular for age 31-80% Maturation was normal in all 3 cell lines and there were only 1% blasts. Cytogenetics were normal including FISH studies to look at chromosome 5 and chromosome 7 deletions which were not present. Hemoglobin was 9.7 at the time with MCV 90, Waltman count 4700, and platelets 162,000. There were no ringed sideroblasts.  She was initially followed with observation alone until hemoglobin fell to 8.6 by June of 2010. She was started on a trial of Aranesp and had a nice response with rise in hemoglobin up to 12.5 g. The patient is noted to have dementia. She has not received any injection for many months as her hemoglobin has been elevated at greater than 10 g. She is not symptomatic. The last Aranesp injection was on 10/04/2014  INTERVAL HISTORY: Please see below for problem oriented charting. The patient denies any recent signs or symptoms of bleeding such as spontaneous epistaxis, hematuria or hematochezia. She denies any symptoms of anemia such as chest pain, shortness of breath or dizziness.  REVIEW OF SYSTEMS:   Constitutional: Denies fevers, chills or abnormal weight loss Eyes: Denies blurriness of vision Ears, nose, mouth, throat, and face: Denies mucositis or sore throat Respiratory: Denies cough, dyspnea or  wheezes Cardiovascular: Denies palpitation, chest discomfort or lower extremity swelling Gastrointestinal:  Denies nausea, heartburn or change in bowel habits Skin: Denies abnormal skin rashes Lymphatics: Denies new lymphadenopathy or easy bruising Neurological:Denies numbness, tingling or new weaknesses Behavioral/Psych: Mood is stable, no new changes  All other systems were reviewed with the patient and are negative.  I have reviewed the past medical history, past surgical history, social history and family history with the patient and they are unchanged from previous note.  ALLERGIES:  is allergic to lisinopril and penicillins.  MEDICATIONS:  Current Outpatient Prescriptions  Medication Sig Dispense Refill  . aspirin EC 81 MG tablet Take 81 mg by mouth daily.    . Calcium Carbonate-Vit D-Min (CALTRATE 600+D PLUS) 600-400 MG-UNIT per tablet Take 1 tablet by mouth daily.      . Cholecalciferol (VITAMIN D-3 PO) Take by mouth. 1,000mg  one daily    . cycloSPORINE (RESTASIS) 0.05 % ophthalmic emulsion Place 1 drop into both eyes 2 (two) times daily.      Marland Kitchen donepezil (ARICEPT) 10 MG tablet Take 10 mg by mouth daily.  11  . levothyroxine (SYNTHROID, LEVOTHROID) 88 MCG tablet TAKE 1 TABLET BY MOUTH ONCE DAILY 90 tablet 1  . losartan (COZAAR) 100 MG tablet TAKE 1 TABLET EVERY DAY FOR BLOOD PRESSURE 30 tablet 5  . Multiple Vitamin (MULTIVITAMIN WITH MINERALS) TABS Take 1 tablet by mouth daily.    . vitamin B-12 (CYANOCOBALAMIN) 1000 MCG tablet Take 1,000 mcg by mouth daily.    Marland Kitchen acetaminophen (TYLENOL) 500 MG tablet 500 mg one every 8 hours as needed 30 tablet   . ondansetron (ZOFRAN) 4 MG tablet Take 4 mg by mouth.  One every 8 hours as needed for nausea    . polyethylene glycol powder (GLYCOLAX/MIRALAX) powder Take 17 g by mouth daily as needed.     No current facility-administered medications for this visit.    PHYSICAL EXAMINATION: ECOG PERFORMANCE STATUS: 0 - Asymptomatic  Filed  Vitals:   08/08/15 1107  BP: 130/51  Pulse: 73  Temp: 97.9 F (36.6 C)  Resp: 18   Filed Weights   08/08/15 1107  Weight: 147 lb 4.8 oz (66.815 kg)    GENERAL:alert, no distress and comfortable SKIN: Noted skin discoloration in her arms and forearms. Nothing suspicious for skin cancer. EYES: normal, Conjunctiva are pink and non-injected, sclera clear OROPHARYNX:no exudate, no erythema and lips, buccal mucosa, and tongue normal  NECK: supple, thyroid normal size, non-tender, without nodularity LYMPH:  no palpable lymphadenopathy in the cervical, axillary or inguinal LUNGS: clear to auscultation and percussion with normal breathing effort HEART: regular rate & rhythm and no murmurs and no lower extremity edema ABDOMEN:abdomen soft, non-tender and normal bowel sounds Musculoskeletal:no cyanosis of digits and no clubbing  NEURO: alert & oriented x 3 with fluent speech, no focal motor/sensory deficits  LABORATORY DATA:  I have reviewed the data as listed    Component Value Date/Time   NA 140 04/11/2015   NA 140 09/29/2014 1644   NA 141 04/20/2013 1320   K 4.7 04/11/2015   K 4.4 04/20/2013 1320   CL 108 09/29/2014 1644   CL 109* 04/20/2013 1320   CO2 19 09/29/2014 1644   CO2 24 04/20/2013 1320   GLUCOSE 95 09/29/2014 1644   GLUCOSE 129* 04/20/2013 1320   BUN 29* 04/11/2015   BUN 26* 09/29/2014 1644   BUN 27.4* 04/20/2013 1320   CREATININE 1.1 04/11/2015   CREATININE 1.1 09/29/2014 1644   CREATININE 1.1 04/20/2013 1320   CALCIUM 9.9 09/29/2014 1644   CALCIUM 9.1 04/20/2013 1320   PROT 7.1 09/29/2014 1644   PROT 6.5 04/20/2013 1320   ALBUMIN 3.8 09/29/2014 1644   ALBUMIN 3.6 04/20/2013 1320   AST 15 04/11/2015   AST 14 04/20/2013 1320   ALT 20 04/11/2015   ALT 13 04/20/2013 1320   ALKPHOS 69 04/11/2015   ALKPHOS 74 04/20/2013 1320   BILITOT 0.8 09/29/2014 1644   BILITOT 0.85 04/20/2013 1320   GFRNONAA 45* 07/09/2012 0355   GFRAA 52* 07/09/2012 0355    No  results found for: SPEP, UPEP  Lab Results  Component Value Date   WBC 9.5 08/08/2015   NEUTROABS 6.8* 08/08/2015   HGB 10.3* 08/08/2015   HCT 31.0* 08/08/2015   MCV 85.2 08/08/2015   PLT 358 08/08/2015      Chemistry      Component Value Date/Time   NA 140 04/11/2015   NA 140 09/29/2014 1644   NA 141 04/20/2013 1320   K 4.7 04/11/2015   K 4.4 04/20/2013 1320   CL 108 09/29/2014 1644   CL 109* 04/20/2013 1320   CO2 19 09/29/2014 1644   CO2 24 04/20/2013 1320   BUN 29* 04/11/2015   BUN 26* 09/29/2014 1644   BUN 27.4* 04/20/2013 1320   CREATININE 1.1 04/11/2015   CREATININE 1.1 09/29/2014 1644   CREATININE 1.1 04/20/2013 1320   GLU 87 04/11/2015      Component Value Date/Time   CALCIUM 9.9 09/29/2014 1644   CALCIUM 9.1 04/20/2013 1320   ALKPHOS 69 04/11/2015   ALKPHOS 74 04/20/2013 1320   AST 15 04/11/2015   AST 14 04/20/2013  1320   ALT 20 04/11/2015   ALT 13 04/20/2013 1320   BILITOT 0.8 09/29/2014 1644   BILITOT 0.85 04/20/2013 1320      ASSESSMENT & PLAN:  Myelodysplastic syndrome Due to history of stroke, I recommend giving her injection only to keep hemoglobin greater than 10 g. Her hemoglobin has been stable over the last 6 months. I will space of her appointment to once a year. She does not require treatment now. If her hemoglobin remains stable here, I will consider discharging her from the clinic   History of melanoma She denies skin lesions. I will defer to her dermatologist for further management/surveillance  Dementia The patient has dementia but stable without behavioral problems. She will continue medical management   Orders Placed This Encounter  Procedures  . CBC & Diff and Retic    Standing Status: Future     Number of Occurrences:      Standing Expiration Date: 09/11/2016   All questions were answered. The patient knows to call the clinic with any problems, questions or concerns. No barriers to learning was detected. I spent 15  minutes counseling the patient face to face. The total time spent in the appointment was 20 minutes and more than 50% was on counseling and review of test results     [facility], [place], MD 08/09/2015 8:25 AM

## 2016-01-04 ENCOUNTER — Encounter: Payer: Self-pay | Admitting: Internal Medicine

## 2016-01-04 ENCOUNTER — Non-Acute Institutional Stay: Payer: Medicare Other | Admitting: Internal Medicine

## 2016-01-04 VITALS — BP 142/60 | HR 76 | Temp 97.4°F | Ht 59.0 in | Wt 156.0 lb

## 2016-01-04 DIAGNOSIS — I10 Essential (primary) hypertension: Secondary | ICD-10-CM | POA: Diagnosis not present

## 2016-01-04 DIAGNOSIS — R2681 Unsteadiness on feet: Secondary | ICD-10-CM

## 2016-01-04 DIAGNOSIS — D469 Myelodysplastic syndrome, unspecified: Secondary | ICD-10-CM | POA: Diagnosis not present

## 2016-01-04 DIAGNOSIS — F0390 Unspecified dementia without behavioral disturbance: Secondary | ICD-10-CM

## 2016-01-04 DIAGNOSIS — Z789 Other specified health status: Secondary | ICD-10-CM | POA: Diagnosis not present

## 2016-01-04 DIAGNOSIS — E785 Hyperlipidemia, unspecified: Secondary | ICD-10-CM

## 2016-01-04 DIAGNOSIS — E039 Hypothyroidism, unspecified: Secondary | ICD-10-CM

## 2016-01-04 NOTE — Progress Notes (Signed)
Patient ID: [name], female   DOB: 10/01/1924, 82 y.o.   MRN: [id number]    [place] Room Number: 802  Place of Service: Clinic (12)     Allergies  Allergen Reactions  . Lisinopril     REACTION: angioedema  . Penicillins     REACTION: rash    Chief Complaint  Patient presents with  . Medical Management of Chronic Issues    blood pressure, thyroid, cholesterol    HPI:   Dementia, without behavioral disturbance - unchanged  Essential hypertension - controlled  Hyperlipidemia - no recent lab  Hypothyroidism, unspecified hypothyroidism type - no recent lab  Myelodysplastic syndrome ([place]) - no lab since Aug 2016  Unstable gait - using walker  Active advance directive - Plan: DNR (Do Not Resuscitate)  Patient has no complaints today and feels she is doing well.    Medications: Patient's Medications  New Prescriptions   No medications on file  Previous Medications   ACETAMINOPHEN (TYLENOL) 500 MG TABLET    500 mg one every 8 hours as needed   ASPIRIN EC 81 MG TABLET    Take 81 mg by mouth daily.   CALCIUM CARBONATE-VIT D-MIN (CALTRATE 600+D PLUS) 600-400 MG-UNIT PER TABLET    Take 1 tablet by mouth daily.     CHOLECALCIFEROL (VITAMIN D-3 PO)    Take by mouth. 1,081m one daily   CYCLOSPORINE (RESTASIS) 0.05 % OPHTHALMIC EMULSION    Place 1 drop into both eyes 2 (two) times daily.     DONEPEZIL (ARICEPT) 10 MG TABLET    Take 10 mg by mouth daily.   LEVOTHYROXINE (SYNTHROID, LEVOTHROID) 88 MCG TABLET    TAKE 1 TABLET BY MOUTH ONCE DAILY   LOSARTAN (COZAAR) 100 MG TABLET    TAKE 1 TABLET EVERY DAY FOR BLOOD PRESSURE   MULTIPLE VITAMIN (MULTIVITAMIN WITH MINERALS) TABS    Take 1 tablet by mouth daily.   POLYETHYLENE GLYCOL POWDER (GLYCOLAX/MIRALAX) POWDER    Take 17 g by mouth daily as needed.   VITAMIN B-12 (CYANOCOBALAMIN) 1000 MCG TABLET    Take 1,000 mcg by mouth daily.  Modified Medications   No medications on file    Discontinued Medications   ONDANSETRON (ZOFRAN) 4 MG TABLET    Take 4 mg by mouth. One every 8 hours as needed for nausea     Review of Systems  Constitutional: Negative.   HENT: Negative for congestion, dental problem, hearing loss, rhinorrhea, sinus pressure, sore throat and tinnitus.   Eyes: Negative for pain, discharge and visual disturbance.  Respiratory: Positive for cough. Negative for shortness of breath.   Cardiovascular: Negative for chest pain, palpitations and leg swelling.  Gastrointestinal: Positive for constipation. Negative for nausea, vomiting, abdominal pain, diarrhea, blood in stool and abdominal distention.  Endocrine:       History of hypothyroidism  Genitourinary: Negative for dysuria, urgency, frequency, hematuria, flank pain, vaginal bleeding, vaginal discharge, difficulty urinating, vaginal pain and pelvic pain.  Musculoskeletal: Positive for back pain and gait problem. Negative for joint swelling and arthralgias.       Bilateral shoulder discomfort and limited range of motion.  Skin: Negative for rash.       History of onychomycosis followed by Dr. HMallie Mussel History melanoma of the right chin and neck. Removed by Moh's surgery for 03/20/15.  Neurological: Negative for dizziness, syncope, speech difficulty, weakness, numbness and headaches.       History of dementia. Currently on Aricept.  Hematological: Negative for adenopathy.       History myelodysplasia with anemia. Followed by Dr. Alvy Bimler.  Psychiatric/Behavioral: Positive for confusion and sleep disturbance (difficulty arousing from sleep in the morning. Hypersomnia.). Negative for behavioral problems, dysphoric mood and agitation. The patient is not nervous/anxious.     Filed Vitals:   01/04/16 1428  BP: 142/60  Pulse: 76  Temp: 97.4 F (36.3 C)  TempSrc: Oral  Height: 4' 11" (1.499 m)  Weight: 156 lb (70.761 kg)  SpO2: 96%   Wt Readings from Last 3 Encounters:  01/04/16 156 lb (70.761 kg)   08/08/15 147 lb 4.8 oz (66.815 kg)  07/06/15 147 lb (66.679 kg)    Body mass index is 31.49 kg/(m^2).  Physical Exam  Constitutional: She is oriented to person, place, and time. She appears well-developed and well-nourished.  Blood pressure low normal  HENT:  Head: Normocephalic and atraumatic.  Right Ear: External ear normal.  Left Ear: External ear normal.  Mouth/Throat: Oropharynx is clear and moist.  Eyes: Conjunctivae and EOM are normal.  Neck: Normal range of motion. Neck supple. No JVD present. No thyromegaly present.  Cardiovascular: Normal rate, regular rhythm, normal heart sounds and intact distal pulses.   No murmur heard. Pulmonary/Chest: Effort normal and breath sounds normal. She has no wheezes. She has no rales.  Abdominal: Soft. Bowel sounds are normal. She exhibits no distension and no mass. There is no tenderness. There is no rebound and no guarding.  Lower midline scar  Genitourinary: Vagina normal. Guaiac negative stool.  Musculoskeletal: Normal range of motion. She exhibits no edema or tenderness.  Bilateral total knee replacement scars. Unstable gait. Decreased range of motion at shoulders.  Neurological: She is alert and oriented to person, place, and time. She has normal reflexes. No cranial nerve deficit. She exhibits normal muscle tone. Coordination normal.  Skin: Skin is warm and dry. No rash noted.  Scar right chin and neck. Bilateral onychomycosis of the toes.  Psychiatric: She has a normal mood and affect. Her behavior is normal.  Mild confusion     Labs reviewed: Lab Summary Latest Ref Rng 08/08/2015 04/11/2015 02/06/2015 01/03/2015 11/29/2014 11/01/2014 10/04/2014  Hemoglobin 11.6 - 15.9 g/dL 10.3(L) (None) 10.4(L) 10.1(L) 10.4(L) 11.3(L) 10.0(L)  Hematocrit 34.8 - 46.6 % 31.0(L) (None) 30.5(L) 31.2(L) 32.0(L) 34.5(L) 31.0(L)  Fromer count 3.9 - 10.3 10e3/uL 9.5 (None) 10.2 8.4 8.3 6.9 6.9  Platelet count 145 - 400 10e3/uL 358 (None) 348 296 297  256 293  Sodium 137 - 147 mmol/L (None) 140 (None) (None) (None) (None) (None)  Potassium 3.4 - 5.3 mmol/L (None) 4.7 (None) (None) (None) (None) (None)  Calcium - (None) (None) (None) (None) (None) (None) (None)  Phosphorus - (None) (None) (None) (None) (None) (None) (None)  Creatinine 0.5 - 1.1 mg/dL (None) 1.1 (None) (None) (None) (None) (None)  AST 13 - 35 U/L (None) 15 (None) (None) (None) (None) (None)  Alk Phos 25 - 125 U/L (None) 69 (None) (None) (None) (None) (None)  Bilirubin - (None) (None) (None) (None) (None) (None) (None)  Glucose - (None) 87 (None) (None) (None) (None) (None)  Cholesterol - (None) (None) (None) (None) (None) (None) (None)  HDL cholesterol - (None) (None) (None) (None) (None) (None) (None)  Triglycerides - (None) (None) (None) (None) (None) (None) (None)  LDL Direct - (None) (None) (None) (None) (None) (None) (None)  LDL Calc - (None) (None) (None) (None) (None) (None) (None)  Total protein - (None) (None) (None) (None) (None) (None) (None)  Albumin - (  None) (None) (None) (None) (None) (None) (None)   Lab Results  Component Value Date   TSH 2.11 04/11/2015   Lab Results  Component Value Date   BUN 29* 04/11/2015   BUN 26* 09/29/2014   BUN 34* 11/30/2013   Lab Results  Component Value Date   CREATININE 1.1 04/11/2015   CREATININE 1.1 09/29/2014   CREATININE 1.1 11/30/2013   Lab Results  Component Value Date   HGBA1C 5.6 04/11/2015   HGBA1C 5.1 11/30/2013   HGBA1C 5.0 07/08/2012       Assessment/Plan  1. Dementia, without behavioral disturbance Continue donepezil MMSE next visit  2. Essential hypertension Adequately controlled  3. Hyperlipidemia *-Lipid panel, future  4. Hypothyroidism, unspecified hypothyroidism type -TSH, future  5. Myelodysplastic syndrome ([place]) -CBC, future  6. Unstable gait Continue touse walker  7. Active advance directive - DNR (Do Not Resuscitate)

## 2016-01-09 LAB — CBC AND DIFFERENTIAL
HCT: 29 % — AB (ref 36–46)
Hemoglobin: 9.5 g/dL — AB (ref 12.0–16.0)
PLATELETS: 359 10*3/uL (ref 150–399)
WBC: 7.7 10^3/mL

## 2016-01-09 LAB — BASIC METABOLIC PANEL
BUN: 32 mg/dL — AB (ref 4–21)
CREATININE: 1.2 mg/dL — AB (ref 0.5–1.1)
Glucose: 87 mg/dL
Potassium: 4.9 mmol/L (ref 3.4–5.3)
Sodium: 141 mmol/L (ref 137–147)

## 2016-01-09 LAB — HEPATIC FUNCTION PANEL
ALT: 13 U/L (ref 7–35)
AST: 11 U/L — AB (ref 13–35)
Alkaline Phosphatase: 56 U/L (ref 25–125)

## 2016-01-09 LAB — LIPID PANEL
HDL: 53 mg/dL (ref 35–70)
LDL Cholesterol: 73 mg/dL

## 2016-01-11 ENCOUNTER — Encounter: Payer: Self-pay | Admitting: Nurse Practitioner

## 2016-01-11 DIAGNOSIS — N182 Chronic kidney disease, stage 2 (mild): Secondary | ICD-10-CM | POA: Insufficient documentation

## 2016-01-11 DIAGNOSIS — D509 Iron deficiency anemia, unspecified: Secondary | ICD-10-CM | POA: Insufficient documentation

## 2016-02-08 ENCOUNTER — Non-Acute Institutional Stay: Payer: Medicare Other | Admitting: Nurse Practitioner

## 2016-02-08 ENCOUNTER — Encounter: Payer: Self-pay | Admitting: Nurse Practitioner

## 2016-02-08 DIAGNOSIS — F0390 Unspecified dementia without behavioral disturbance: Secondary | ICD-10-CM

## 2016-02-08 DIAGNOSIS — M5440 Lumbago with sciatica, unspecified side: Secondary | ICD-10-CM

## 2016-02-08 DIAGNOSIS — I10 Essential (primary) hypertension: Secondary | ICD-10-CM

## 2016-02-08 DIAGNOSIS — N182 Chronic kidney disease, stage 2 (mild): Secondary | ICD-10-CM | POA: Diagnosis not present

## 2016-02-08 DIAGNOSIS — E039 Hypothyroidism, unspecified: Secondary | ICD-10-CM

## 2016-02-08 DIAGNOSIS — D638 Anemia in other chronic diseases classified elsewhere: Secondary | ICD-10-CM

## 2016-02-08 DIAGNOSIS — K5900 Constipation, unspecified: Secondary | ICD-10-CM

## 2016-02-08 DIAGNOSIS — D469 Myelodysplastic syndrome, unspecified: Secondary | ICD-10-CM | POA: Diagnosis not present

## 2016-02-08 NOTE — Assessment & Plan Note (Signed)
01/09/16 Hgb 9.5, hx of myelodysplasia, f/u oncology, continue Vit B12. Observe.

## 2016-02-08 NOTE — Assessment & Plan Note (Signed)
Controlled, continue Losartan 100mg  daily.

## 2016-02-08 NOTE — Progress Notes (Signed)
Patient ID: Brittany [name], female   DOB: 1924-11-04, 82 y.o.   MRN: [id number]  Location:  [facility] Room Number: 802 A Place of Service:  SNF (31) Provider:  Lennie Odor Mast NP  GREEN, [name], MD  Patient Care Team: Estill Dooms, MD as PCP - General (Internal Medicine) Milus Banister, MD as Consulting Physician (Gastroenterology) Heath Lark, MD as Consulting Physician (Hematology and Oncology)  Extended Emergency Contact Information Primary Emergency Contact: [facility] Address: [address]          [place], [place] [address] [name] of [place] Phone: [phone] Work Phone: [phone] Mobile Phone: [phone] Relation: Son Secondary Emergency Contact: [name],[name] Address: [place] [address] [name] of [place] Phone: [phone] Relation: Daughter  Code Status:  DNR Goals of care: Advanced Directive information Advanced Directives 02/08/2016  Does patient have an advance directive? Yes  Type of Advance Directive Out of facility DNR (pink MOST or yellow form)  Does patient want to make changes to advanced directive? No - Patient declined  Copy of advanced directive(s) in chart? Yes  Pre-existing out of facility DNR order (yellow form or pink MOST form) -     Chief Complaint  Patient presents with  . Medical Management of Chronic Issues    Routine Visit    HPI:  Pt is a 82 y.o. female seen today for medical management of chronic diseases.     Past Medical History  Diagnosis Date  . Melanoma ([place]) 03/20/15    removed with Moh's surgery  . Diverticulosis of colon   . Hyperlipidemia   . Hypothyroidism   . Osteoporosis   . Blood transfusion 2006  . Myelodysplasia 02/04/2012  . Normochromic normocytic anemia 02/04/2012  . Hypertension   . Colon polyps   . DJD (degenerative joint disease)   . Rectal prolapse   . CVA (cerebral infarction) 2013  . Dementia   . Onychomycosis   .  Unstable gait   . Shoulder pain    Past Surgical History  Procedure Laterality Date  . Polypectomy  2004    Laparoscopic  . Appendectomy    . Carpal tunnel release Bilateral 2006  . Melanoma excision Right 2005    arm  . Replacement total knee  2005 & 2006  . Rectocele repair    . Vaginal hysterectomy  1989  . Flexible sigmoidoscopy  07/09/2012    Procedure: FLEXIBLE SIGMOIDOSCOPY;  Surgeon: Inda Castle, MD;  Location: [facility];  Service: Endoscopy;  Laterality: N/A;    Allergies  Allergen Reactions  . Lisinopril     REACTION: angioedema  . Penicillins     REACTION: rash      Medication List       This list is accurate as of: 02/08/16 12:01 PM.  Always use your most recent med list.               acetaminophen 500 MG tablet  Commonly known as:  TYLENOL  500 mg one every 8 hours as needed     aspirin EC 81 MG tablet  Take 81 mg by mouth daily.     CALTRATE 600+D PLUS 600-400 MG-UNIT per tablet  Take 1 tablet by mouth daily.     cycloSPORINE 0.05 % ophthalmic emulsion  Commonly known as:  RESTASIS  Place 1 drop into both eyes 2 (two) times daily.     levothyroxine  88 MCG tablet  Commonly known as:  SYNTHROID, LEVOTHROID  TAKE 1 TABLET BY MOUTH ONCE DAILY     losartan 100 MG tablet  Commonly known as:  COZAAR  TAKE 1 TABLET EVERY DAY FOR BLOOD PRESSURE     multivitamin with minerals Tabs tablet  Take 1 tablet by mouth daily.     polyethylene glycol powder powder  Commonly known as:  GLYCOLAX/MIRALAX  Take 17 g by mouth daily as needed.     vitamin B-12 1000 MCG tablet  Commonly known as:  CYANOCOBALAMIN  Take 1,000 mcg by mouth daily.     VITAMIN D-3 PO  Take by mouth. 1,000mg  one daily        Review of Systems  Constitutional: Negative.   HENT: Negative for congestion, dental problem, hearing loss, rhinorrhea, sinus pressure, sore throat and tinnitus.   Eyes: Negative for pain, discharge and visual disturbance.  Respiratory: Positive  for cough. Negative for shortness of breath.   Cardiovascular: Negative for chest pain, palpitations and leg swelling.  Gastrointestinal: Positive for constipation. Negative for nausea, vomiting, abdominal pain, diarrhea, blood in stool and abdominal distention.  Endocrine:       History of hypothyroidism  Genitourinary: Negative for dysuria, urgency, frequency, hematuria, flank pain, vaginal bleeding, vaginal discharge, difficulty urinating, vaginal pain and pelvic pain.  Musculoskeletal: Positive for back pain and gait problem. Negative for joint swelling and arthralgias.       Bilateral shoulder discomfort and limited range of motion.  Skin: Negative for rash.       History of onychomycosis followed by Dr. Mallie Mussel. History melanoma of the right chin and neck. Removed by Moh's surgery for 03/20/15.  Neurological: Negative for dizziness, syncope, speech difficulty, weakness, numbness and headaches.       History of dementia. Currently on Aricept.  Hematological: Negative for adenopathy.       History myelodysplasia with anemia. Followed by Dr. Alvy Bimler.  Psychiatric/Behavioral: Positive for confusion and sleep disturbance (difficulty arousing from sleep in the morning. Hypersomnia.). Negative for behavioral problems, dysphoric mood and agitation. The patient is not nervous/anxious.     Immunization History  Administered Date(s) Administered  . Influenza Split 10/17/2011, 09/14/2012  . Influenza Whole 10/17/2007  . Influenza, High Dose Seasonal PF 10/11/2013, 10/05/2014  . Influenza-Unspecified 09/05/2015  . PPD Test 01/12/2015  . Pneumococcal Conjugate-13 09/29/2014  . Pneumococcal Polysaccharide-23 12/16/1998  . Tdap 04/23/2011  . Zoster 07/04/2006   Pertinent  Health Maintenance Due  Topic Date Due  . OPHTHALMOLOGY EXAM  11/15/2014  . FOOT EXAM  11/30/2014  . HEMOGLOBIN A1C  10/11/2015  . INFLUENZA VACCINE  07/16/2016  . DEXA SCAN  Completed  . PNA vac Low Risk Adult  Completed    Fall Risk  01/04/2016 04/06/2015 09/29/2014  Falls in the past year? No No No   Functional Status Survey:    Filed Vitals:   02/08/16 0838  BP: 132/68  Pulse: 66  Temp: 97.7 F (36.5 C)  TempSrc: Oral  Resp: 16  Height: 4\' 11"  (1.499 m)  Weight: 154 lb 6.4 oz (70.035 kg)   Body mass index is 31.17 kg/(m^2). Physical Exam  Constitutional: She is oriented to person, place, and time. She appears well-developed and well-nourished.  Blood pressure low normal  HENT:  Head: Normocephalic and atraumatic.  Right Ear: External ear normal.  Left Ear: External ear normal.  Mouth/Throat: Oropharynx is clear and moist.  Eyes: Conjunctivae and EOM are normal.  Neck: Normal range of  motion. Neck supple. No JVD present. No thyromegaly present.  Cardiovascular: Normal rate, regular rhythm, normal heart sounds and intact distal pulses.   No murmur heard. Pulmonary/Chest: Effort normal and breath sounds normal. She has no wheezes. She has no rales.  Abdominal: Soft. Bowel sounds are normal. She exhibits no distension and no mass. There is no tenderness. There is no rebound and no guarding.  Lower midline scar  Genitourinary: Vagina normal. Guaiac negative stool.  Musculoskeletal: Normal range of motion. She exhibits no edema or tenderness.  Bilateral total knee replacement scars. Unstable gait. Decreased range of motion at shoulders.  Neurological: She is alert and oriented to person, place, and time. She has normal reflexes. No cranial nerve deficit. She exhibits normal muscle tone. Coordination normal.  Skin: Skin is warm and dry. No rash noted.  Scar right chin and neck. Bilateral onychomycosis of the toes.  Psychiatric: She has a normal mood and affect. Her behavior is normal.  Mild confusion    Labs reviewed:  Recent Labs  04/11/15 01/09/16  NA 140 141  K 4.7 4.9  BUN 29* 32*  CREATININE 1.1 1.2*    Recent Labs  04/11/15 01/09/16  AST 15 11*  ALT 20 13  ALKPHOS 69 56     Recent Labs  08/08/15 1052 01/09/16  WBC 9.5 7.7  NEUTROABS 6.8*  --   HGB 10.3* 9.5*  HCT 31.0* 29*  MCV 85.2  --   PLT 358 359   Lab Results  Component Value Date   TSH 2.11 04/11/2015   Lab Results  Component Value Date   HGBA1C 5.6 04/11/2015   Lab Results  Component Value Date   CHOL 201* 11/30/2013   HDL 53 01/09/2016   LDLCALC 73 01/09/2016   LDLDIRECT 131.2 11/30/2013   TRIG 155.0* 11/30/2013   CHOLHDL 4 11/30/2013    Significant Diagnostic Results in last 30 days:  No results found.  Assessment/Plan  Myelodysplastic syndrome F/u oncology, taking Vit B12, last Hgb 9.5 01/09/16.   Hypothyroidism Last TSH 2.714 01/09/16, continue Levothyroxine 15mcg daily  Essential hypertension Controlled, continue Losartan 100mg  daily.   Back pain On and off-prn Tylenol is adequate for her back pain. Ambulates with walker independently.   Dementia Seen by neurology at [facility] in November 2013. MRI revealed multi-infarct dementia. Aricept prescribed 04/06/15 MMSE 25/30, failed clock drawing.  02/08/16 the patient is off [place]. Update MMSE  Constipation Stable, diet and prn daily Miralax to ensure regular BM  Anemia of chronic disease 01/09/16 Hgb 9.5, hx of myelodysplasia, f/u oncology, continue Vit B12. Observe.    Chronic kidney disease 01/09/16 Bun 32, creat 1.16, continue to observe     Family/ staff Communication: continue AL for care needs. Update MMSE  Labs/tests ordered:  none

## 2016-02-08 NOTE — Assessment & Plan Note (Signed)
Seen by neurology at [facility] in November 2013. MRI revealed multi-infarct dementia. Aricept prescribed 04/06/15 MMSE 25/30, failed clock drawing.  02/08/16 the patient is off [place]. Update MMSE

## 2016-02-08 NOTE — Assessment & Plan Note (Signed)
01/09/16 Bun 32, creat 1.16, continue to observe

## 2016-02-08 NOTE — Assessment & Plan Note (Signed)
On and off-prn Tylenol is adequate for her back pain. Ambulates with walker independently.   

## 2016-02-08 NOTE — Assessment & Plan Note (Signed)
Stable, diet and prn daily Miralax to ensure regular BM 

## 2016-02-08 NOTE — Assessment & Plan Note (Signed)
Last TSH 2.714 01/09/16, continue Levothyroxine 33mcg daily

## 2016-02-08 NOTE — Assessment & Plan Note (Signed)
F/u oncology, taking Vit B12, last Hgb 9.5 01/09/16.

## 2016-04-04 ENCOUNTER — Non-Acute Institutional Stay: Payer: Medicare Other | Admitting: Nurse Practitioner

## 2016-04-04 DIAGNOSIS — N183 Chronic kidney disease, stage 3 unspecified: Secondary | ICD-10-CM

## 2016-04-04 DIAGNOSIS — K5900 Constipation, unspecified: Secondary | ICD-10-CM | POA: Diagnosis not present

## 2016-04-04 DIAGNOSIS — I10 Essential (primary) hypertension: Secondary | ICD-10-CM

## 2016-04-04 DIAGNOSIS — M5440 Lumbago with sciatica, unspecified side: Secondary | ICD-10-CM

## 2016-04-04 DIAGNOSIS — E039 Hypothyroidism, unspecified: Secondary | ICD-10-CM

## 2016-04-04 DIAGNOSIS — F0390 Unspecified dementia without behavioral disturbance: Secondary | ICD-10-CM

## 2016-04-04 DIAGNOSIS — D638 Anemia in other chronic diseases classified elsewhere: Secondary | ICD-10-CM | POA: Diagnosis not present

## 2016-04-04 NOTE — Assessment & Plan Note (Signed)
On and off-prn Tylenol is adequate for her back pain. Ambulates with walker independently.   

## 2016-04-04 NOTE — Assessment & Plan Note (Signed)
Last TSH 2.714 01/09/16, continue Levothyroxine 92mcg daily Update TSH

## 2016-04-04 NOTE — Assessment & Plan Note (Signed)
Seen by neurology at [facility] in November 2013. MRI revealed multi-infarct dementia. Aricept prescribed 04/06/15 MMSE 25/30, failed clock drawing.  Continue Aricept  

## 2016-04-04 NOTE — Assessment & Plan Note (Signed)
01/09/16 Hgb 9.5, hx of myelodysplasia, f/u oncology, continue Vit B12. Observe.   Update CBC

## 2016-04-04 NOTE — Progress Notes (Signed)
Patient ID: Brittany [name], female   DOB: 05/02/1924, 82 y.o.   MRN: [id number]  Location:  [place] Room Number: 802 A Place of Service:  SNF (31) Provider:  Lennie Odor Mast NP  GREEN, [name], MD  Patient Care Team: Estill Dooms, MD as PCP - General (Internal Medicine) Milus Banister, MD as Consulting Physician (Gastroenterology) Heath Lark, MD as Consulting Physician (Hematology and Oncology)  Extended Emergency Contact Information Primary Emergency Contact: [facility] Address: [address]          [place], [place] [address] [name] of [place] Phone: [phone] Work Phone: [phone] Mobile Phone: [phone] Relation: Son Secondary Emergency Contact: [name],[name] Address: [place] [address] [name] of [place] Phone: [phone] Relation: Daughter  Code Status:  DNR Goals of care: Advanced Directive information Advanced Directives 04/04/2016  Does patient have an advance directive? Yes  Type of Advance Directive Out of facility DNR (pink MOST or yellow form)  Does patient want to make changes to advanced directive? No - Patient declined  Copy of advanced directive(s) in chart? Yes     Chief Complaint  Patient presents with  . Medical Management of Chronic Issues    Routine Visit    HPI:  Pt is a 82 y.o. female seen today for medical management of chronic diseases.  Hx of HTN, controlled on Losartan 100mg  daily, last TSH wnl 3 months ago while taking Levothyroxine 72mcg, memory is preserved with Aricept 10mg  daily, able to function well in AL setting. Mild low Hgb 9.5, elevated Bun/creat 32/1.2 from 01/08/15, on B12 1080mcg po daily.    Past Medical History  Diagnosis Date  . Melanoma ([place]) 03/20/15    removed with Moh's surgery  . Diverticulosis of colon   . Hyperlipidemia   . Hypothyroidism   . Osteoporosis   . Blood transfusion 2006  . Myelodysplasia 02/04/2012  . Normochromic  normocytic anemia 02/04/2012  . Hypertension   . Colon polyps   . DJD (degenerative joint disease)   . Rectal prolapse   . CVA (cerebral infarction) 2013  . Dementia   . Onychomycosis   . Unstable gait   . Shoulder pain    Past Surgical History  Procedure Laterality Date  . Polypectomy  2004    Laparoscopic  . Appendectomy    . Carpal tunnel release Bilateral 2006  . Melanoma excision Right 2005    arm  . Replacement total knee  2005 & 2006  . Rectocele repair    . Vaginal hysterectomy  1989  . Flexible sigmoidoscopy  07/09/2012    Procedure: FLEXIBLE SIGMOIDOSCOPY;  Surgeon: Inda Castle, MD;  Location: [facility];  Service: Endoscopy;  Laterality: N/A;    Allergies  Allergen Reactions  . Lisinopril     REACTION: angioedema  . Penicillins     REACTION: rash      Medication List       This list is accurate as of: 04/04/16 12:10 PM.  Always use your most recent med list.               acetaminophen 500 MG tablet  Commonly known as:  TYLENOL  500 mg one every 8 hours as needed     aspirin EC 81 MG tablet  Take 81 mg by mouth daily.     CALTRATE 600+D PLUS 600-400 MG-UNIT per tablet  Take 1 tablet by mouth  daily.     cycloSPORINE 0.05 % ophthalmic emulsion  Commonly known as:  RESTASIS  Place 1 drop into both eyes 2 (two) times daily.     donepezil 10 MG tablet  Commonly known as:  ARICEPT  Take 10 mg by mouth daily.     levothyroxine 88 MCG tablet  Commonly known as:  SYNTHROID, LEVOTHROID  TAKE 1 TABLET BY MOUTH ONCE DAILY     losartan 100 MG tablet  Commonly known as:  COZAAR  TAKE 1 TABLET EVERY DAY FOR BLOOD PRESSURE     multivitamin with minerals Tabs tablet  Take 1 tablet by mouth daily.     polyethylene glycol powder powder  Commonly known as:  GLYCOLAX/MIRALAX  Take 17 g by mouth daily as needed.     vitamin B-12 1000 MCG tablet  Commonly known as:  CYANOCOBALAMIN  Take 1,000 mcg by mouth daily.     VITAMIN D-3 PO  Take by  mouth. 1,000mg  one daily        Review of Systems  Constitutional: Negative.   HENT: Negative for congestion, dental problem, hearing loss, rhinorrhea, sinus pressure, sore throat and tinnitus.   Eyes: Negative for pain, discharge and visual disturbance.  Respiratory: Positive for cough. Negative for shortness of breath.   Cardiovascular: Negative for chest pain, palpitations and leg swelling.  Gastrointestinal: Positive for constipation. Negative for nausea, vomiting, abdominal pain, diarrhea, blood in stool and abdominal distention.  Endocrine:       History of hypothyroidism  Genitourinary: Negative for dysuria, urgency, frequency, hematuria, flank pain, vaginal bleeding, vaginal discharge, difficulty urinating, vaginal pain and pelvic pain.  Musculoskeletal: Positive for back pain and gait problem. Negative for joint swelling and arthralgias.       Bilateral shoulder discomfort and limited range of motion.  Skin: Negative for rash.       History of onychomycosis followed by Dr. Mallie Mussel. History melanoma of the right chin and neck. Removed by Moh's surgery for 03/20/15.  Neurological: Negative for dizziness, syncope, speech difficulty, weakness, numbness and headaches.       History of dementia. Currently on Aricept.  Hematological: Negative for adenopathy.       History myelodysplasia with anemia. Followed by Dr. Alvy Bimler.  Psychiatric/Behavioral: Positive for confusion and sleep disturbance (difficulty arousing from sleep in the morning. Hypersomnia.). Negative for behavioral problems, dysphoric mood and agitation. The patient is not nervous/anxious.     Immunization History  Administered Date(s) Administered  . Influenza Split 10/17/2011, 09/14/2012  . Influenza Whole 10/17/2007  . Influenza, High Dose Seasonal PF 10/11/2013, 10/05/2014  . Influenza-Unspecified 09/05/2015  . PPD Test 01/12/2015  . Pneumococcal Conjugate-13 09/29/2014  . Pneumococcal Polysaccharide-23 12/16/1998    . Tdap 04/23/2011  . Zoster 07/04/2006   Pertinent  Health Maintenance Due  Topic Date Due  . OPHTHALMOLOGY EXAM  11/15/2014  . FOOT EXAM  11/30/2014  . HEMOGLOBIN A1C  10/11/2015  . INFLUENZA VACCINE  07/16/2016  . DEXA SCAN  Completed  . PNA vac Low Risk Adult  Completed   Fall Risk  01/04/2016 04/06/2015 09/29/2014  Falls in the past year? No No No   Functional Status Survey:    There were no vitals filed for this visit. There is no weight on file to calculate BMI. Physical Exam  Constitutional: She is oriented to person, place, and time. She appears well-developed and well-nourished.  Blood pressure low normal  HENT:  Head: Normocephalic and atraumatic.  Right Ear: External ear normal.  Left Ear: External ear normal.  Mouth/Throat: Oropharynx is clear and moist.  Eyes: Conjunctivae and EOM are normal.  Neck: Normal range of motion. Neck supple. No JVD present. No thyromegaly present.  Cardiovascular: Normal rate, regular rhythm, normal heart sounds and intact distal pulses.   No murmur heard. Pulmonary/Chest: Effort normal and breath sounds normal. She has no wheezes. She has no rales.  Abdominal: Soft. Bowel sounds are normal. She exhibits no distension and no mass. There is no tenderness. There is no rebound and no guarding.  Lower midline scar  Genitourinary: Vagina normal. Guaiac negative stool.  Musculoskeletal: Normal range of motion. She exhibits no edema or tenderness.  Bilateral total knee replacement scars. Unstable gait. Decreased range of motion at shoulders.  Neurological: She is alert and oriented to person, place, and time. She has normal reflexes. No cranial nerve deficit. She exhibits normal muscle tone. Coordination normal.  Skin: Skin is warm and dry. No rash noted.  Scar right chin and neck. Bilateral onychomycosis of the toes.  Psychiatric: She has a normal mood and affect. Her behavior is normal.  Mild confusion    Labs reviewed:  Recent  Labs  04/11/15 01/09/16  NA 140 141  K 4.7 4.9  BUN 29* 32*  CREATININE 1.1 1.2*    Recent Labs  04/11/15 01/09/16  AST 15 11*  ALT 20 13  ALKPHOS 69 56    Recent Labs  08/08/15 1052 01/09/16  WBC 9.5 7.7  NEUTROABS 6.8*  --   HGB 10.3* 9.5*  HCT 31.0* 29*  MCV 85.2  --   PLT 358 359   Lab Results  Component Value Date   TSH 2.11 04/11/2015   Lab Results  Component Value Date   HGBA1C 5.6 04/11/2015   Lab Results  Component Value Date   CHOL 201* 11/30/2013   HDL 53 01/09/2016   LDLCALC 73 01/09/2016   LDLDIRECT 131.2 11/30/2013   TRIG 155.0* 11/30/2013   CHOLHDL 4 11/30/2013    Significant Diagnostic Results in last 30 days:  No results found.  Assessment/Plan  Anemia of chronic disease 01/09/16 Hgb 9.5, hx of myelodysplasia, f/u oncology, continue Vit B12. Observe.   Update CBC  Back pain On and off-prn Tylenol is adequate for her back pain. Ambulates with walker independently.    Chronic kidney disease 01/09/16 Bun 32, creat 1.16, continue to observe  Update CMP  Constipation Stable, diet and prn daily Miralax to ensure regular BM   Dementia Seen by neurology at [facility] in November 2013. MRI revealed multi-infarct dementia. Aricept prescribed 04/06/15 MMSE 25/30, failed clock drawing.  Continue Aricept    Essential hypertension Controlled, continue Losartan 100mg  daily.    Hypothyroidism Last TSH 2.714 01/09/16, continue Levothyroxine 25mcg daily Update TSH    Family/ staff Communication: continue AL for care needs.   Labs/tests ordered: CBC, CMP, TSH

## 2016-04-04 NOTE — Assessment & Plan Note (Signed)
Stable, diet and prn daily Miralax to ensure regular BM 

## 2016-04-04 NOTE — Assessment & Plan Note (Signed)
Controlled, continue Losartan 100mg  daily.

## 2016-04-04 NOTE — Assessment & Plan Note (Signed)
01/09/16 Bun 32, creat 1.16, continue to observe  Update CMP

## 2016-04-09 LAB — HEPATIC FUNCTION PANEL
ALT: 21 U/L (ref 7–35)
AST: 15 U/L (ref 13–35)
Alkaline Phosphatase: 62 U/L (ref 25–125)
BILIRUBIN, TOTAL: 0.5 mg/dL

## 2016-04-09 LAB — BASIC METABOLIC PANEL
BUN: 34 mg/dL — AB (ref 4–21)
Creatinine: 1.1 mg/dL (ref 0.5–1.1)
GLUCOSE: 79 mg/dL
Potassium: 4.9 mmol/L (ref 3.4–5.3)
SODIUM: 142 mmol/L (ref 137–147)

## 2016-04-09 LAB — CBC AND DIFFERENTIAL
HEMATOCRIT: 29 % — AB (ref 36–46)
Hemoglobin: 9.7 g/dL — AB (ref 12.0–16.0)
PLATELETS: 372 10*3/uL (ref 150–399)
WBC: 6.2 10*3/mL

## 2016-04-09 LAB — TSH: TSH: 1.98 u[IU]/mL (ref 0.41–5.90)

## 2016-06-04 ENCOUNTER — Encounter: Payer: Self-pay | Admitting: Nurse Practitioner

## 2016-06-04 ENCOUNTER — Non-Acute Institutional Stay: Payer: Medicare Other | Admitting: Nurse Practitioner

## 2016-06-04 DIAGNOSIS — K5900 Constipation, unspecified: Secondary | ICD-10-CM | POA: Diagnosis not present

## 2016-06-04 DIAGNOSIS — E039 Hypothyroidism, unspecified: Secondary | ICD-10-CM

## 2016-06-04 DIAGNOSIS — N182 Chronic kidney disease, stage 2 (mild): Secondary | ICD-10-CM

## 2016-06-04 DIAGNOSIS — D469 Myelodysplastic syndrome, unspecified: Secondary | ICD-10-CM | POA: Diagnosis not present

## 2016-06-04 DIAGNOSIS — J209 Acute bronchitis, unspecified: Secondary | ICD-10-CM

## 2016-06-04 DIAGNOSIS — I10 Essential (primary) hypertension: Secondary | ICD-10-CM

## 2016-06-04 DIAGNOSIS — F0390 Unspecified dementia without behavioral disturbance: Secondary | ICD-10-CM | POA: Diagnosis not present

## 2016-06-04 DIAGNOSIS — D638 Anemia in other chronic diseases classified elsewhere: Secondary | ICD-10-CM | POA: Diagnosis not present

## 2016-06-04 DIAGNOSIS — M5440 Lumbago with sciatica, unspecified side: Secondary | ICD-10-CM | POA: Diagnosis not present

## 2016-06-04 DIAGNOSIS — R05 Cough: Secondary | ICD-10-CM | POA: Diagnosis not present

## 2016-06-05 DIAGNOSIS — H2513 Age-related nuclear cataract, bilateral: Secondary | ICD-10-CM | POA: Diagnosis not present

## 2016-06-05 DIAGNOSIS — J209 Acute bronchitis, unspecified: Secondary | ICD-10-CM | POA: Insufficient documentation

## 2016-06-05 NOTE — Assessment & Plan Note (Signed)
F/u oncology, taking Vit B12, last Hgb 9.5 01/09/16.

## 2016-06-05 NOTE — Assessment & Plan Note (Signed)
On and off-prn Tylenol is adequate for her back pain. Ambulates with walker independently.   

## 2016-06-05 NOTE — Assessment & Plan Note (Signed)
01/09/16 Hgb 9.5, 04/09/16 Hgb 9.7,  hx of myelodysplasia, f/u oncology, continue Vit B12

## 2016-06-05 NOTE — Assessment & Plan Note (Signed)
Seen by neurology at [facility] in November 2013. MRI revealed multi-infarct dementia. Aricept prescribed 04/06/15 MMSE 25/30, failed clock drawing.  Continue Aricept  

## 2016-06-05 NOTE — Assessment & Plan Note (Signed)
Levaquin 500mg  daily x 7days, Mucinex 600mg  bid x5days, Duoneb tid x 5 days, Medrol dose pk, obtain CXR to r/o PNA

## 2016-06-05 NOTE — Progress Notes (Signed)
Patient ID: Brittany [name], female   DOB: Jul 09, 1924, 82 y.o.   MRN: [id number]  Location:   [facility] [place] Room Number: [id number] A Place of Service:   [facility] Provider:  [facility] Barnes Florek NP  GREEN, [name], MD  Patient Care Team: Estill Dooms, MD as PCP - General (Internal Medicine) Milus Banister, MD as Consulting Physician (Gastroenterology) Heath Lark, MD as Consulting Physician (Hematology and Oncology)  Extended Emergency Contact Information Primary Emergency Contact: [facility] Address: [address]          [place], [place] [address] [name] of [place] Phone: [phone] Work Phone: [phone] Mobile Phone: [phone] Relation: Son Secondary Emergency Contact: [name],[name] Address: [place] [address] [name] of [place] Phone: [phone] Relation: Daughter  Code Status:  DNR Goals of care: Advanced Directive information Advanced Directives 06/04/2016  Does patient have an advance directive? Yes  Type of Advance Directive Out of facility DNR (pink MOST or yellow form)  Does patient want to make changes to advanced directive? No - Patient declined  Copy of advanced directive(s) in chart? Yes     Chief Complaint  Patient presents with  . Acute Visit    HPI:  Pt is a 82 y.o. female seen today for medical management of chronic diseases.  Hx of HTN, controlled on Losartan 100mg  daily, last TSH wnl 3 months ago while taking Levothyroxine 74mcg, memory is preserved with Aricept 10mg  daily, able to function well in AL setting. Mild low Hgb 9.5, elevated Bun/creat 32/1.2 from 01/08/15, on B12 1057mcg po daily.    Congestive cough 2-3 days, no O2 desaturation, denied chest pain, she is afebrile, OTC mucinex is not effective.    Past Medical History  Diagnosis Date  . Melanoma ([place]) 03/20/15    removed with Moh's surgery  . Diverticulosis of colon   . Hyperlipidemia   . Hypothyroidism   . Osteoporosis   . Blood  transfusion 2006  . Myelodysplasia 02/04/2012  . Normochromic normocytic anemia 02/04/2012  . Hypertension   . Colon polyps   . DJD (degenerative joint disease)   . Rectal prolapse   . CVA (cerebral infarction) 2013  . Dementia   . Onychomycosis   . Unstable gait   . Shoulder pain    Past Surgical History  Procedure Laterality Date  . Polypectomy  2004    Laparoscopic  . Appendectomy    . Carpal tunnel release Bilateral 2006  . Melanoma excision Right 2005    arm  . Replacement total knee  2005 & 2006  . Rectocele repair    . Vaginal hysterectomy  1989  . Flexible sigmoidoscopy  07/09/2012    Procedure: FLEXIBLE SIGMOIDOSCOPY;  Surgeon: Inda Castle, MD;  Location: [facility];  Service: Endoscopy;  Laterality: N/A;    Allergies  Allergen Reactions  . Lisinopril     REACTION: angioedema  . Penicillins     REACTION: rash      Medication List       This list is accurate as of: 06/04/16 11:59 PM.  Always use your most recent med list.               acetaminophen 500 MG tablet  Commonly known as:  TYLENOL  500 mg one every 8 hours as needed     aspirin EC 81 MG tablet  Take 81 mg by mouth daily.  CALTRATE 600+D PLUS 600-400 MG-UNIT per tablet  Take 1 tablet by mouth daily.     cycloSPORINE 0.05 % ophthalmic emulsion  Commonly known as:  RESTASIS  Place 1 drop into both eyes 2 (two) times daily.     donepezil 10 MG tablet  Commonly known as:  ARICEPT  Take 10 mg by mouth daily.     levothyroxine 88 MCG tablet  Commonly known as:  SYNTHROID, LEVOTHROID  TAKE 1 TABLET BY MOUTH ONCE DAILY     losartan 100 MG tablet  Commonly known as:  COZAAR  TAKE 1 TABLET EVERY DAY FOR BLOOD PRESSURE     multivitamin with minerals Tabs tablet  Take 1 tablet by mouth daily.     polyethylene glycol powder powder  Commonly known as:  GLYCOLAX/MIRALAX  Take 17 g by mouth daily as needed.     vitamin B-12 1000 MCG tablet  Commonly known as:  CYANOCOBALAMIN    Take 1,000 mcg by mouth daily.     VITAMIN D-3 PO  Take by mouth. 1,000mg  one daily        Review of Systems  Constitutional: Negative.   HENT: Negative for congestion, dental problem, hearing loss, rhinorrhea, sinus pressure, sore throat and tinnitus.   Eyes: Negative for pain, discharge and visual disturbance.  Respiratory: Positive for cough. Negative for shortness of breath.   Cardiovascular: Negative for chest pain, palpitations and leg swelling.  Gastrointestinal: Positive for constipation. Negative for nausea, vomiting, abdominal pain, diarrhea, blood in stool and abdominal distention.  Endocrine:       History of hypothyroidism  Genitourinary: Negative for dysuria, urgency, frequency, hematuria, flank pain, vaginal bleeding, vaginal discharge, difficulty urinating, vaginal pain and pelvic pain.  Musculoskeletal: Positive for back pain and gait problem. Negative for joint swelling and arthralgias.       Bilateral shoulder discomfort and limited range of motion.  Skin: Negative for rash.       History of onychomycosis followed by Dr. Mallie Mussel. History melanoma of the right chin and neck. Removed by Moh's surgery for 03/20/15.  Neurological: Negative for dizziness, syncope, speech difficulty, weakness, numbness and headaches.       History of dementia. Currently on Aricept.  Hematological: Negative for adenopathy.       History myelodysplasia with anemia. Followed by Dr. Alvy Bimler.  Psychiatric/Behavioral: Positive for confusion and sleep disturbance (difficulty arousing from sleep in the morning. Hypersomnia.). Negative for behavioral problems, dysphoric mood and agitation. The patient is not nervous/anxious.     Immunization History  Administered Date(s) Administered  . Influenza Split 10/17/2011, 09/14/2012  . Influenza Whole 10/17/2007  . Influenza, High Dose Seasonal PF 10/11/2013, 10/05/2014  . Influenza-Unspecified 09/05/2015  . PPD Test 01/12/2015  . Pneumococcal  Conjugate-13 09/29/2014  . Pneumococcal Polysaccharide-23 12/16/1998  . Tdap 04/23/2011  . Zoster 07/04/2006   Pertinent  Health Maintenance Due  Topic Date Due  . OPHTHALMOLOGY EXAM  11/15/2014  . FOOT EXAM  11/30/2014  . HEMOGLOBIN A1C  10/11/2015  . INFLUENZA VACCINE  07/16/2016  . DEXA SCAN  Completed  . PNA vac Low Risk Adult  Completed   Fall Risk  01/04/2016 04/06/2015 09/29/2014  Falls in the past year? No No No   Functional Status Survey:    Filed Vitals:   06/04/16 1530  BP: 120/62  Pulse: 78  Temp: 99.4 F (37.4 C)  TempSrc: Oral  Resp: 22  Height: 4\' 11"  (1.499 m)  Weight: 157 lb (71.215 kg)   Body  mass index is 31.69 kg/(m^2). Physical Exam  Constitutional: She is oriented to person, place, and time. She appears well-developed and well-nourished.  Blood pressure low normal  HENT:  Head: Normocephalic and atraumatic.  Right Ear: External ear normal.  Left Ear: External ear normal.  Mouth/Throat: Oropharynx is clear and moist.  Eyes: Conjunctivae and EOM are normal.  Neck: Normal range of motion. Neck supple. No JVD present. No thyromegaly present.  Cardiovascular: Normal rate, regular rhythm, normal heart sounds and intact distal pulses.   No murmur heard. Pulmonary/Chest: Effort normal. She has no wheezes. She has rales.  Posterior mid to lower lung rales.   Abdominal: Soft. Bowel sounds are normal. She exhibits no distension and no mass. There is no tenderness. There is no rebound and no guarding.  Lower midline scar  Genitourinary: Vagina normal. Guaiac negative stool.  Musculoskeletal: Normal range of motion. She exhibits no edema or tenderness.  Bilateral total knee replacement scars. Unstable gait. Decreased range of motion at shoulders.  Neurological: She is alert and oriented to person, place, and time. She has normal reflexes. No cranial nerve deficit. She exhibits normal muscle tone. Coordination normal.  Skin: Skin is warm and dry. No rash  noted.  Scar right chin and neck. Bilateral onychomycosis of the toes.  Psychiatric: She has a normal mood and affect. Her behavior is normal.  Mild confusion    Labs reviewed:  Recent Labs  01/09/16  NA 141  K 4.9  BUN 32*  CREATININE 1.2*    Recent Labs  01/09/16  AST 11*  ALT 13  ALKPHOS 56    Recent Labs  08/08/15 1052 01/09/16  WBC 9.5 7.7  NEUTROABS 6.8*  --   HGB 10.3* 9.5*  HCT 31.0* 29*  MCV 85.2  --   PLT 358 359   Lab Results  Component Value Date   TSH 2.11 04/11/2015   Lab Results  Component Value Date   HGBA1C 5.6 04/11/2015   Lab Results  Component Value Date   CHOL 201* 11/30/2013   HDL 53 01/09/2016   LDLCALC 73 01/09/2016   LDLDIRECT 131.2 11/30/2013   TRIG 155.0* 11/30/2013   CHOLHDL 4 11/30/2013    Significant Diagnostic Results in last 30 days:  No results found.  Assessment/Plan  Acute bronchitis Levaquin 500mg  daily x 7days, Mucinex 600mg  bid x5days, Duoneb tid x 5 days, Medrol dose pk, obtain CXR to r/o PNA  Essential hypertension Controlled, continue Losartan 100mg  daily. 04/09/16 Na 142, K 4.9, Bun 34, creat 1.08   Constipation Stable, diet and prn daily Miralax to ensure regular BM   Hypothyroidism Last TSH 1.98 04/09/16, continue Levothyroxine 84mcg daily   Dementia Seen by neurology at [facility] in November 2013. MRI revealed multi-infarct dementia. Aricept prescribed 04/06/15 MMSE 25/30, failed clock drawing.  Continue Aricept      Chronic kidney disease 04/09/16 Na 142, K 4.9, Bun 34, creat 1.08  Anemia of chronic disease 01/09/16 Hgb 9.5, 04/09/16 Hgb 9.7,  hx of myelodysplasia, f/u oncology, continue Vit B12   Back pain On and off-prn Tylenol is adequate for her back pain. Ambulates with walker independently.  Myelodysplastic syndrome F/u oncology, taking Vit B12, last Hgb 9.5 01/09/16.     Family/ staff Communication: continue AL for care needs.   Labs/tests ordered: CXR

## 2016-06-05 NOTE — Assessment & Plan Note (Signed)
04/09/16 Na 142, K 4.9, Bun 34, creat 1.08

## 2016-06-05 NOTE — Assessment & Plan Note (Signed)
Stable, diet and prn daily Miralax to ensure regular BM 

## 2016-06-05 NOTE — Assessment & Plan Note (Signed)
Last TSH 1.98 04/09/16, continue Levothyroxine 88mcg daily  

## 2016-06-05 NOTE — Assessment & Plan Note (Addendum)
Controlled, continue Losartan 100mg  daily. 04/09/16 Na 142, K 4.9, Bun 34, creat 1.08

## 2016-07-04 ENCOUNTER — Encounter: Payer: Self-pay | Admitting: Internal Medicine

## 2016-07-04 ENCOUNTER — Non-Acute Institutional Stay: Payer: Medicare Other | Admitting: Internal Medicine

## 2016-07-04 ENCOUNTER — Other Ambulatory Visit: Payer: Self-pay

## 2016-07-04 VITALS — BP 118/60 | HR 75 | Temp 97.8°F | Ht 59.0 in | Wt 156.0 lb

## 2016-07-04 DIAGNOSIS — F0390 Unspecified dementia without behavioral disturbance: Secondary | ICD-10-CM | POA: Diagnosis not present

## 2016-07-04 DIAGNOSIS — E785 Hyperlipidemia, unspecified: Secondary | ICD-10-CM | POA: Diagnosis not present

## 2016-07-04 DIAGNOSIS — D469 Myelodysplastic syndrome, unspecified: Secondary | ICD-10-CM

## 2016-07-04 DIAGNOSIS — I10 Essential (primary) hypertension: Secondary | ICD-10-CM

## 2016-07-04 DIAGNOSIS — N182 Chronic kidney disease, stage 2 (mild): Secondary | ICD-10-CM | POA: Diagnosis not present

## 2016-07-04 DIAGNOSIS — E039 Hypothyroidism, unspecified: Secondary | ICD-10-CM

## 2016-07-04 DIAGNOSIS — R2681 Unsteadiness on feet: Secondary | ICD-10-CM

## 2016-07-04 DIAGNOSIS — D638 Anemia in other chronic diseases classified elsewhere: Secondary | ICD-10-CM | POA: Diagnosis not present

## 2016-07-04 NOTE — Progress Notes (Signed)
Patient ID: Brittany [name], female   DOB: [birth year], 82 y.o.   MRN: [id number]  History and Physical   Location:   [place] Room Number: [id number] Place of Service:  Clinic (12)   Patient Care Team: Estill Dooms, MD as PCP - General (Internal Medicine) Milus Banister, MD as Consulting Physician (Gastroenterology) Heath Lark, MD as Consulting Physician (Hematology and Oncology)  Extended Emergency Contact Information Primary Emergency Contact: [facility] Address: [address]          [place], [place] [address] [name] of [place] Phone: [phone] Work Phone: [phone] Mobile Phone: [phone] Relation: Son Secondary Emergency Contact: [name],[name] Address: [place] [address] [name] of [place] Phone: [phone] Relation: Daughter  Code Status: DNR Goals of Care: Advanced Directive information Advanced Directives 07/04/2016  Does patient have an advance directive? Yes  Type of Advance Directive Out of facility DNR (pink MOST or yellow form)  Does patient want to make changes to advanced directive? -  Copy of advanced directive(s) in chart? Yes  Pre-existing out of facility DNR order (yellow form or pink MOST form) Yellow form placed in chart (order not valid for inpatient use)     Chief Complaint  Patient presents with  . Annual Exam    Wellness exam  . Medical Management of Chronic Issues    blood pressure, cholesterol, thyroid, dementia  . MMSE    27/30 failed clock drawing    HPI: Patient is a 82 y.o. female seen in today for an annual wellness exam.  Generally feeling OK.  Anemia of chronic disease - stable. Following CBC every few months.  Chronic kidney disease, stage 2 (mild) - stable  Dementia, without behavioral disturbance - unchanged. Remains social and high functioning.  Essential hypertension - controlled  Hyperlipidemia - controlled  Hypothyroidism, unspecified hypothyroidism type -  compensated  Myelodysplastic syndrome ([place]) - related to anemia  Unstable gait - using walkeer regularly. No falls.    Depression screen [facility] 2/9 07/04/2016 01/04/2016 04/06/2015 09/29/2014  Decreased Interest 0 0 0 0  Down, Depressed, Hopeless 0 0 0 0  PHQ - 2 Score 0 0 0 0    Fall Risk  07/04/2016 01/04/2016 04/06/2015 09/29/2014  Falls in the past year? No No No No   MMSE - Mini Mental State Exam 07/04/2016 04/06/2015  Orientation to time 3 3  Orientation to Place 4 3  Registration 3 3  Attention/ Calculation 5 5  Recall 3 3  Language- name 2 objects 2 2  Language- repeat 1 1  Language- follow 3 step command 3 2  Language- read & follow direction 1 1  Write a sentence 1 1  Copy design 1 1  Total score 27 25     Health Maintenance  Topic Date Due  . OPHTHALMOLOGY EXAM  11/15/2014  . FOOT EXAM  11/30/2014  . HEMOGLOBIN A1C  10/11/2015  . INFLUENZA VACCINE  07/16/2016  . TETANUS/TDAP  04/22/2021  . DEXA SCAN  Completed  . ZOSTAVAX  Completed  . PNA vac Low Risk Adult  Completed    Urinary incontinence no Functional Status Survey: Is the patient deaf or have difficulty hearing?: No Does the patient have difficulty seeing, even when wearing glasses/contacts?: No Does the patient have difficulty concentrating, remembering, or making decisions?: Yes Does the patient have difficulty walking or climbing stairs?: Yes Does the patient have difficulty dressing or bathing?:  No Does the patient have difficulty doing errands alone such as visiting a doctor's office or shopping?: Yes Exercisenone Diet regular  Hearing:  Mildly diminished Dentition: normal Pain: no  Past Medical History  Diagnosis Date  . Melanoma ([place]) 03/20/15    removed with Moh's surgery  . Diverticulosis of colon   . Hyperlipidemia   . Hypothyroidism   . Osteoporosis   . Blood transfusion 2006  . Myelodysplasia 02/04/2012  . Normochromic normocytic anemia 02/04/2012  . Hypertension   . Colon polyps     . DJD (degenerative joint disease)   . Rectal prolapse   . CVA (cerebral infarction) 2013  . Dementia   . Onychomycosis   . Unstable gait   . Shoulder pain     Past Surgical History  Procedure Laterality Date  . Polypectomy  2004    Laparoscopic  . Appendectomy    . Carpal tunnel release Bilateral 2006  . Melanoma excision Right 2005    arm  . Replacement total knee  2005 & 2006  . Rectocele repair    . Vaginal hysterectomy  1989  . Flexible sigmoidoscopy  07/09/2012    Procedure: FLEXIBLE SIGMOIDOSCOPY;  Surgeon: Inda Castle, MD;  Location: [facility];  Service: Endoscopy;  Laterality: N/A;    Family History  Problem Relation Age of Onset  . Prostate cancer Brother   . Cancer Brother     renal cell carcinoma  . Heart disease Brother   . Breast cancer Mother 75  . Cancer Father     unknown kind   Family Status  Relation Status Death Age  . Brother Deceased 19  . Mother Deceased 46  . Father Deceased 79      Social History   Social History  . Marital Status: Married    Spouse Name: N/A  . Number of Children: N/A  . Years of Education: N/A   Occupational History  . piano teacher    Social History Main Topics  . Smoking status: Never Smoker   . Smokeless tobacco: Never Used  . Alcohol Use: No  . Drug Use: No  . Sexual Activity: Yes    Birth Control/ Protection: Surgical     Comment: HYST   Other Topics Concern  . Not on file   Social History Narrative   Lives at [facility], moved to [place] 12/28/14   Widowed   Never smoked   Alcohol none   Caffeine Coffee 1 cup   Exercise none       Allergies  Allergen Reactions  . Lisinopril     REACTION: angioedema  . Penicillins     REACTION: rash      Medication List       This list is accurate as of: 07/04/16  3:01 PM.  Always use your most recent med list.               acetaminophen 500 MG tablet  Commonly known as:  TYLENOL  500 mg one every 8 hours as needed     aspirin  EC 81 MG tablet  Take 81 mg by mouth daily.     CALTRATE 600+D PLUS 600-400 MG-UNIT per tablet  Take 1 tablet by mouth daily.     cycloSPORINE 0.05 % ophthalmic emulsion  Commonly known as:  RESTASIS  Place 1 drop into both eyes 2 (two) times daily.     donepezil 10 MG tablet  Commonly known as:  ARICEPT  Take 10  mg by mouth daily.     levothyroxine 88 MCG tablet  Commonly known as:  SYNTHROID, LEVOTHROID  TAKE 1 TABLET BY MOUTH ONCE DAILY     losartan 100 MG tablet  Commonly known as:  COZAAR  TAKE 1 TABLET EVERY DAY FOR BLOOD PRESSURE     multivitamin with minerals Tabs tablet  Take 1 tablet by mouth daily.     polyethylene glycol powder powder  Commonly known as:  GLYCOLAX/MIRALAX  Take 17 g by mouth daily as needed.     vitamin B-12 1000 MCG tablet  Commonly known as:  CYANOCOBALAMIN  Take 1,000 mcg by mouth daily.     VITAMIN D-3 PO  Take by mouth. 1,000mg  one daily         Review of Systems:  Review of Systems  Constitutional: Negative.   HENT: Negative for congestion, dental problem, hearing loss, rhinorrhea, sinus pressure, sore throat and tinnitus.   Eyes: Negative for pain, discharge and visual disturbance.  Respiratory: Positive for cough. Negative for shortness of breath.   Cardiovascular: Negative for chest pain, palpitations and leg swelling.  Gastrointestinal: Positive for constipation. Negative for nausea, vomiting, abdominal pain, diarrhea, blood in stool and abdominal distention.  Endocrine:       History of hypothyroidism  Genitourinary: Negative for dysuria, urgency, frequency, hematuria, flank pain, vaginal bleeding, vaginal discharge, difficulty urinating, vaginal pain and pelvic pain.  Musculoskeletal: Positive for back pain and gait problem. Negative for joint swelling and arthralgias.       Bilateral shoulder discomfort and limited range of motion.  Skin: Negative for rash.       History of onychomycosis followed by Dr. Mallie Mussel. History  melanoma of the right chin and neck. Removed by Moh's surgery for 03/20/15.  Neurological: Negative for dizziness, syncope, speech difficulty, weakness, numbness and headaches.       History of dementia. Currently on Aricept.  Hematological: Negative for adenopathy.       History myelodysplasia with anemia. Followed by Dr. Alvy Bimler.  Psychiatric/Behavioral: Positive for confusion and sleep disturbance (difficulty arousing from sleep in the morning. Hypersomnia.). Negative for behavioral problems, dysphoric mood and agitation. The patient is not nervous/anxious.     Physical Exam: Filed Vitals:   07/04/16 1443  BP: 118/60  Pulse: 75  Temp: 97.8 F (36.6 C)  TempSrc: Oral  Height: 4\' 11"  (1.499 m)  Weight: 156 lb (70.761 kg)  SpO2: 93%   Body mass index is 31.49 kg/(m^2). Physical Exam  Constitutional: She is oriented to person, place, and time. She appears well-developed and well-nourished.  Blood pressure low normal  HENT:  Head: Normocephalic and atraumatic.  Right Ear: External ear normal.  Left Ear: External ear normal.  Mouth/Throat: Oropharynx is clear and moist.  Eyes: Conjunctivae and EOM are normal.  Neck: Normal range of motion. Neck supple. No JVD present. No thyromegaly present.  Cardiovascular: Normal rate, regular rhythm, normal heart sounds and intact distal pulses.   No murmur heard. Pulmonary/Chest: Effort normal. She has no wheezes. She has rales.  Posterior mid to lower lung rales.   Abdominal: Soft. Bowel sounds are normal. She exhibits no distension and no mass. There is no tenderness. There is no rebound and no guarding.  Lower midline scar  Genitourinary: Vagina normal. Guaiac negative stool.  Musculoskeletal: Normal range of motion. She exhibits no edema or tenderness.  Bilateral total knee replacement scars. Unstable gait. Decreased range of motion at shoulders.  Neurological: She is alert and oriented to  person, place, and time. She has normal reflexes.  No cranial nerve deficit. She exhibits normal muscle tone. Coordination normal.  07/04/16 MMSE 27/30. Failed clock drawing.  Skin: Skin is warm and dry. No rash noted.  Scar right chin and neck. Bilateral onychomycosis of the toes.  Psychiatric: She has a normal mood and affect. Her behavior is normal.  Mild confusion    Labs reviewed: Basic Metabolic Panel:  Recent Labs  01/09/16  NA 141  K 4.9  BUN 32*  CREATININE 1.2*   Liver Function Tests:  Recent Labs  01/09/16  AST 11*  ALT 13  ALKPHOS 56   No results for input(s): LIPASE, AMYLASE in the last 8760 hours. No results for input(s): AMMONIA in the last 8760 hours. CBC:  Recent Labs  08/08/15 1052 01/09/16  WBC 9.5 7.7  NEUTROABS 6.8*  --   HGB 10.3* 9.5*  HCT 31.0* 29*  MCV 85.2  --   PLT 358 359   Lipid Panel:  Recent Labs  01/09/16  HDL 53  LDLCALC 73   Lab Results  Component Value Date   HGBA1C 5.6 04/11/2015    Procedures: 06/04/16: CXR: scoliosis and spondylosis. Pulmonary granulomas. No acute cardiopulmonary disease  Assessment/Plan  1. Anemia of chronic disease Continue to monitor  2. Chronic kidney disease, stage 2 (mild) Stable  3. Dementia, without behavioral disturbance Unchanged on Aricept  4. Essential hypertension controlled  5. Hyperlipidemia controlled  6. Hypothyroidism, unspecified hypothyroidism type compensated  7. Myelodysplastic syndrome ([place]) Continue to monitor CBC  8. Unstable gait continue to use walker

## 2016-08-06 ENCOUNTER — Other Ambulatory Visit (HOSPITAL_BASED_OUTPATIENT_CLINIC_OR_DEPARTMENT_OTHER): Payer: Medicare Other

## 2016-08-06 ENCOUNTER — Other Ambulatory Visit: Payer: Self-pay | Admitting: Hematology and Oncology

## 2016-08-06 ENCOUNTER — Encounter: Payer: Self-pay | Admitting: Hematology and Oncology

## 2016-08-06 ENCOUNTER — Ambulatory Visit (HOSPITAL_BASED_OUTPATIENT_CLINIC_OR_DEPARTMENT_OTHER): Payer: Medicare Other | Admitting: Hematology and Oncology

## 2016-08-06 VITALS — BP 102/66 | HR 78 | Temp 97.7°F | Resp 20 | Ht 59.0 in | Wt 156.8 lb

## 2016-08-06 DIAGNOSIS — D539 Nutritional anemia, unspecified: Secondary | ICD-10-CM | POA: Insufficient documentation

## 2016-08-06 DIAGNOSIS — D469 Myelodysplastic syndrome, unspecified: Secondary | ICD-10-CM

## 2016-08-06 LAB — CBC & DIFF AND RETIC
BASO%: 0.5 % (ref 0.0–2.0)
Basophils Absolute: 0 10*3/uL (ref 0.0–0.1)
EOS ABS: 0.2 10*3/uL (ref 0.0–0.5)
EOS%: 2.4 % (ref 0.0–7.0)
HCT: 31.1 % — ABNORMAL LOW (ref 34.8–46.6)
HEMOGLOBIN: 10.4 g/dL — AB (ref 11.6–15.9)
Immature Retic Fract: 7.2 % (ref 1.60–10.00)
LYMPH%: 20.3 % (ref 14.0–49.7)
MCH: 28.7 pg (ref 25.1–34.0)
MCHC: 33.4 g/dL (ref 31.5–36.0)
MCV: 85.7 fL (ref 79.5–101.0)
MONO#: 0.6 10*3/uL (ref 0.1–0.9)
MONO%: 7.1 % (ref 0.0–14.0)
NEUT%: 69.7 % (ref 38.4–76.8)
NEUTROS ABS: 5.7 10*3/uL (ref 1.5–6.5)
Platelets: 368 10*3/uL (ref 145–400)
RBC: 3.63 10*6/uL — ABNORMAL LOW (ref 3.70–5.45)
RDW: 17.1 % — AB (ref 11.2–14.5)
RETIC %: 2.65 % — AB (ref 0.70–2.10)
Retic Ct Abs: 96.2 10*3/uL — ABNORMAL HIGH (ref 33.70–90.70)
WBC: 8.2 10*3/uL (ref 3.9–10.3)
lymph#: 1.7 10*3/uL (ref 0.9–3.3)

## 2016-08-06 LAB — COMPREHENSIVE METABOLIC PANEL
ALBUMIN: 3.9 g/dL (ref 3.5–5.0)
ALK PHOS: 69 U/L (ref 40–150)
ALT: 24 U/L (ref 0–55)
AST: 19 U/L (ref 5–34)
Anion Gap: 10 mEq/L (ref 3–11)
BILIRUBIN TOTAL: 0.61 mg/dL (ref 0.20–1.20)
BUN: 35 mg/dL — AB (ref 7.0–26.0)
CO2: 23 mEq/L (ref 22–29)
Calcium: 10.4 mg/dL (ref 8.4–10.4)
Chloride: 110 mEq/L — ABNORMAL HIGH (ref 98–109)
Creatinine: 1.5 mg/dL — ABNORMAL HIGH (ref 0.6–1.1)
EGFR: 32 mL/min/{1.73_m2} — ABNORMAL LOW (ref >90)
GLUCOSE: 102 mg/dL (ref 70–140)
Potassium: 4.4 mEq/L (ref 3.5–5.1)
SODIUM: 142 meq/L (ref 136–145)
TOTAL PROTEIN: 6.7 g/dL (ref 6.4–8.3)

## 2016-08-06 NOTE — Assessment & Plan Note (Signed)
Her hemoglobin has been stable over the last 12 months. She has not needed darbepoetin injections since 2015. Repeat CBC today showed hemoglobin greater than 10. She is not symptomatic I will discharge her from a clinic and recommended follow-up at the nursing home only. I will see her back in the future if repeat hemoglobin is less than 9

## 2016-08-06 NOTE — Progress Notes (Signed)
[facility] OFFICE PROGRESS NOTE  Patient Care Team: Estill Dooms, MD as PCP - General (Internal Medicine) Milus Banister, MD as Consulting Physician (Gastroenterology) Heath Lark, MD as Consulting Physician (Hematology and Oncology)  SUMMARY OF ONCOLOGIC HISTORY:  I reviewed the patient's records extensive and collaborated the history with the patient. Summary of her history is as follows: This is a pleasant lady with a myeloproliferative disorder with dysplastic features. She was diagnosed in May of 2008 when she presented with unexplained normochromic anemia. Bone marrow aspiration and biopsy was done on 04/23/2007. Marrow was hypercellular for age 42-80% Maturation was normal in all 3 cell lines and there were only 1% blasts. Cytogenetics were normal including FISH studies to look at chromosome 5 and chromosome 7 deletions which were not present. Hemoglobin was 9.7 at the time with MCV 90, Ohlsen count 4700, and platelets 162,000. There were no ringed sideroblasts.  She was initially followed with observation alone until hemoglobin fell to 8.6 by June of 2010. She was started on a trial of Aranesp and had a nice response with rise in hemoglobin up to 12.5 g. The patient is noted to have dementia. She has not received any injection for many months as her hemoglobin has been elevated at greater than 10 g. She is not symptomatic. The last Aranesp injection was on 10/04/2014  INTERVAL HISTORY: Please see below for problem oriented charting. The patient denies any recent signs or symptoms of bleeding such as spontaneous epistaxis, hematuria or hematochezia. She denies symptoms of anemia such chest pain or shortness of breath  REVIEW OF SYSTEMS:   Constitutional: Denies fevers, chills or abnormal weight loss Eyes: Denies blurriness of vision Ears, nose, mouth, throat, and face: Denies mucositis or sore throat Respiratory: Denies cough, dyspnea or wheezes Cardiovascular: Denies  palpitation, chest discomfort or lower extremity swelling Gastrointestinal:  Denies nausea, heartburn or change in bowel habits Skin: Denies abnormal skin rashes Lymphatics: Denies new lymphadenopathy or easy bruising Neurological:Denies numbness, tingling or new weaknesses Behavioral/Psych: Mood is stable, no new changes  All other systems were reviewed with the patient and are negative.  I have reviewed the past medical history, past surgical history, social history and family history with the patient and they are unchanged from previous note.  ALLERGIES:  is allergic to lisinopril and penicillins.  MEDICATIONS:  Current Outpatient Prescriptions  Medication Sig Dispense Refill  . acetaminophen (TYLENOL) 500 MG tablet 500 mg one every 8 hours as needed 30 tablet   . aspirin EC 81 MG tablet Take 81 mg by mouth daily.    . Calcium Carbonate-Vit D-Min (CALTRATE 600+D PLUS) 600-400 MG-UNIT per tablet Take 1 tablet by mouth daily.      . Cholecalciferol (VITAMIN D-3 PO) Take by mouth. 1,000mg  one daily    . cycloSPORINE (RESTASIS) 0.05 % ophthalmic emulsion Place 1 drop into both eyes 2 (two) times daily.      Marland Kitchen donepezil (ARICEPT) 10 MG tablet Take 10 mg by mouth daily.    Marland Kitchen levothyroxine (SYNTHROID, LEVOTHROID) 88 MCG tablet TAKE 1 TABLET BY MOUTH ONCE DAILY 90 tablet 1  . losartan (COZAAR) 100 MG tablet TAKE 1 TABLET EVERY DAY FOR BLOOD PRESSURE 30 tablet 5  . Multiple Vitamin (MULTIVITAMIN WITH MINERALS) TABS Take 1 tablet by mouth daily.    . polyethylene glycol powder (GLYCOLAX/MIRALAX) powder Take 17 g by mouth daily as needed.    . vitamin B-12 (CYANOCOBALAMIN) 1000 MCG tablet Take 1,000 mcg by mouth  daily.     No current facility-administered medications for this visit.     PHYSICAL EXAMINATION: ECOG PERFORMANCE STATUS: 0 - Asymptomatic  Vitals:   08/06/16 1115  BP: 102/66  Pulse: 78  Resp: 20  Temp: 97.7 F (36.5 C)   Filed Weights   08/06/16 1115  Weight: 156 lb 12.8  oz (71.1 kg)    GENERAL:alert, no distress and comfortable SKIN: skin color, texture, turgor are normal, no rashes or significant lesions. Noted skin bruising EYES: normal, Conjunctiva are pink and non-injected, sclera clear Musculoskeletal:no cyanosis of digits and no clubbing  NEURO: alert & oriented x 3 with fluent speech, no focal motor/sensory deficits  LABORATORY DATA:  I have reviewed the data as listed    Component Value Date/Time   NA 142 04/09/2016   NA 141 04/20/2013 1320   K 4.9 04/09/2016   K 4.4 04/20/2013 1320   CL 108 09/29/2014 1644   CL 109 (H) 04/20/2013 1320   CO2 19 09/29/2014 1644   CO2 24 04/20/2013 1320   GLUCOSE 95 09/29/2014 1644   GLUCOSE 129 (H) 04/20/2013 1320   BUN 34 (A) 04/09/2016   BUN 27.4 (H) 04/20/2013 1320   CREATININE 1.1 04/09/2016   CREATININE 1.1 09/29/2014 1644   CREATININE 1.1 04/20/2013 1320   CALCIUM 9.9 09/29/2014 1644   CALCIUM 9.1 04/20/2013 1320   PROT 7.1 09/29/2014 1644   PROT 6.5 04/20/2013 1320   ALBUMIN 3.8 09/29/2014 1644   ALBUMIN 3.6 04/20/2013 1320   AST 15 04/09/2016   AST 14 04/20/2013 1320   ALT 21 04/09/2016   ALT 13 04/20/2013 1320   ALKPHOS 62 04/09/2016   ALKPHOS 74 04/20/2013 1320   BILITOT 0.8 09/29/2014 1644   BILITOT 0.85 04/20/2013 1320   GFRNONAA 45 (L) 07/09/2012 0355   GFRAA 52 (L) 07/09/2012 0355    No results found for: SPEP, UPEP  Lab Results  Component Value Date   WBC 8.2 08/06/2016   NEUTROABS 5.7 08/06/2016   HGB 10.4 (L) 08/06/2016   HCT 31.1 (L) 08/06/2016   MCV 85.7 08/06/2016   PLT 368 08/06/2016      Chemistry      Component Value Date/Time   NA 142 04/09/2016   NA 141 04/20/2013 1320   K 4.9 04/09/2016   K 4.4 04/20/2013 1320   CL 108 09/29/2014 1644   CL 109 (H) 04/20/2013 1320   CO2 19 09/29/2014 1644   CO2 24 04/20/2013 1320   BUN 34 (A) 04/09/2016   BUN 27.4 (H) 04/20/2013 1320   CREATININE 1.1 04/09/2016   CREATININE 1.1 09/29/2014 1644   CREATININE 1.1  04/20/2013 1320   GLU 79 04/09/2016      Component Value Date/Time   CALCIUM 9.9 09/29/2014 1644   CALCIUM 9.1 04/20/2013 1320   ALKPHOS 62 04/09/2016   ALKPHOS 74 04/20/2013 1320   AST 15 04/09/2016   AST 14 04/20/2013 1320   ALT 21 04/09/2016   ALT 13 04/20/2013 1320   BILITOT 0.8 09/29/2014 1644   BILITOT 0.85 04/20/2013 1320     ASSESSMENT & PLAN:  Myelodysplastic syndrome (HCC) Her hemoglobin has been stable over the last 12 months. She has not needed darbepoetin injections since 2015. Repeat CBC today showed hemoglobin greater than 10. She is not symptomatic I will discharge her from a clinic and recommended follow-up at the nursing home only. I will see her back in the future if repeat hemoglobin is less than 9  No orders of the defined types were placed in this encounter.  All questions were answered. The patient knows to call the clinic with any problems, questions or concerns. No barriers to learning was detected. I spent 10 minutes counseling the patient face to face. The total time spent in the appointment was 15 minutes and more than 50% was on counseling and review of test results     [facility], [place], MD 08/06/2016 11:46 AM

## 2016-08-07 ENCOUNTER — Encounter: Payer: Self-pay | Admitting: *Deleted

## 2016-08-07 ENCOUNTER — Non-Acute Institutional Stay: Payer: Medicare Other | Admitting: *Deleted

## 2016-08-07 DIAGNOSIS — D469 Myelodysplastic syndrome, unspecified: Secondary | ICD-10-CM

## 2016-08-07 DIAGNOSIS — K5900 Constipation, unspecified: Secondary | ICD-10-CM

## 2016-08-07 DIAGNOSIS — F0390 Unspecified dementia without behavioral disturbance: Secondary | ICD-10-CM | POA: Diagnosis not present

## 2016-08-07 DIAGNOSIS — I10 Essential (primary) hypertension: Secondary | ICD-10-CM

## 2016-08-07 DIAGNOSIS — D539 Nutritional anemia, unspecified: Secondary | ICD-10-CM | POA: Diagnosis not present

## 2016-08-07 DIAGNOSIS — D638 Anemia in other chronic diseases classified elsewhere: Secondary | ICD-10-CM

## 2016-08-07 DIAGNOSIS — N181 Chronic kidney disease, stage 1: Secondary | ICD-10-CM

## 2016-08-07 DIAGNOSIS — E039 Hypothyroidism, unspecified: Secondary | ICD-10-CM | POA: Diagnosis not present

## 2016-08-07 DIAGNOSIS — M549 Dorsalgia, unspecified: Secondary | ICD-10-CM

## 2016-08-07 LAB — SEDIMENTATION RATE: Sedimentation Rate-Westergren: 4 mm/h (ref 0–40)

## 2016-08-07 NOTE — Assessment & Plan Note (Signed)
On and off-prn Tylenol is adequate for her back pain. Ambulates with walker independently.   

## 2016-08-07 NOTE — Progress Notes (Signed)
Location:   [facility] Room Number: [place] of Service:  SNF (31) Provider:  Labrisha Wuellner  NP  Jeanmarie Hubert, MD  Patient Care Team: Estill Dooms, MD as PCP - General (Internal Medicine) Milus Banister, MD as Consulting Physician (Gastroenterology) Heath Lark, MD as Consulting Physician (Hematology and Oncology)  Extended Emergency Contact Information Primary Emergency Contact: [facility] Address: [address]          [place], [place] [address] [name] of [address] Phone: [phone] Work Phone: [phone] Mobile Phone: [phone] Relation: Son Secondary Emergency Contact: [name],[name] Address: [place] [address] [name] of [place] Phone: [phone] Relation: Daughter  Code Status:  DNR Goals of care: Advanced Directive information Advanced Directives 10/23/2016  Does patient have an advance directive? Yes  Type of Advance Directive Out of facility DNR (pink MOST or yellow form)  Does patient want to make changes to advanced directive? No - Patient declined  Copy of advanced directive(s) in chart? Yes  Would patient like information on creating an advanced directive? -  Pre-existing out of facility DNR order (yellow form or pink MOST form) -     Chief Complaint  Patient presents with  . Acute Visit    Slide to floor from edge of bed, skin tear to arm    HPI:  Pt is a 82 y.o. female seen today for evaluation of chronic medical conditions.    Hx of HTN, controlled on Losartan 100mg  daily, last TSH wnl 3 months ago while taking Levothyroxine 30mcg, memory is preserved with Aricept 10mg  daily, able to function well in AL setting. Anemia, last Hgb 10.4 at Oncology 08/08/16, dc'd from Oncology, no Aranesp since 2015, may f/u if Hgb <9 in the future, on B12 1052mcg po daily.    Past Medical History:  Diagnosis Date  . Blood transfusion 2006  . Colon polyps   . CVA (cerebral  infarction) 2013  . Dementia   . Diverticulosis of colon   . DJD (degenerative joint disease)   . Hyperlipidemia   . Hypertension   . Hypothyroidism   . Melanoma ([place]) 03/20/15   removed with Moh's surgery  . Myelodysplasia 02/04/2012  . Normochromic normocytic anemia 02/04/2012  . Onychomycosis   . Osteoporosis   . Rectal prolapse   . Shoulder pain   . Unstable gait    Past Surgical History:  Procedure Laterality Date  . APPENDECTOMY    . CARPAL TUNNEL RELEASE Bilateral 2006  . FLEXIBLE SIGMOIDOSCOPY  07/09/2012   Procedure: FLEXIBLE SIGMOIDOSCOPY;  Surgeon: Inda Castle, MD;  Location: [facility];  Service: Endoscopy;  Laterality: N/A;  . MELANOMA EXCISION Right 2005   arm  . POLYPECTOMY  2004   Laparoscopic  . RECTOCELE REPAIR    . REPLACEMENT TOTAL KNEE  2005 & 2006  . VAGINAL HYSTERECTOMY  1989    Allergies  Allergen Reactions  . Lisinopril     REACTION: angioedema  . Penicillins     REACTION: rash      Medication List       Accurate as of 08/07/16 11:59 PM. Always use your most recent med list.          acetaminophen 500 MG tablet Commonly known as:  TYLENOL 500 mg one every 8 hours as needed   aspirin EC 81 MG tablet Take 81 mg by mouth daily.  CALTRATE 600+D PLUS 600-400 MG-UNIT per tablet Take 1 tablet by mouth daily.   cycloSPORINE 0.05 % ophthalmic emulsion Commonly known as:  RESTASIS Place 1 drop into both eyes 2 (two) times daily.   donepezil 10 MG tablet Commonly known as:  ARICEPT Take 10 mg by mouth daily.   levothyroxine 88 MCG tablet Commonly known as:  SYNTHROID, LEVOTHROID TAKE 1 TABLET BY MOUTH ONCE DAILY   losartan 100 MG tablet Commonly known as:  COZAAR TAKE 1 TABLET EVERY DAY FOR BLOOD PRESSURE   multivitamin with minerals Tabs tablet Take 1 tablet by mouth daily.   polyethylene glycol powder powder Commonly known as:  GLYCOLAX/MIRALAX Take 17 g by mouth daily as needed.   vitamin B-12 1000 MCG  tablet Commonly known as:  CYANOCOBALAMIN Take 1,000 mcg by mouth daily.   VITAMIN D-3 PO Take by mouth. 1,000mg  one daily       Review of Systems  Constitutional: Negative.   HENT: Negative for congestion, dental problem, hearing loss, rhinorrhea, sinus pressure, sore throat and tinnitus.   Eyes: Negative for pain, discharge and visual disturbance.  Respiratory: Positive for cough. Negative for shortness of breath.   Cardiovascular: Negative for chest pain, palpitations and leg swelling.  Gastrointestinal: Positive for constipation. Negative for abdominal distention, abdominal pain, blood in stool, diarrhea, nausea and vomiting.  Endocrine:       History of hypothyroidism  Genitourinary: Negative for difficulty urinating, dysuria, flank pain, frequency, hematuria, pelvic pain, urgency, vaginal bleeding, vaginal discharge and vaginal pain.  Musculoskeletal: Positive for back pain and gait problem. Negative for arthralgias and joint swelling.       Bilateral shoulder discomfort and limited range of motion.  Skin: Negative for rash.       History of onychomycosis followed by Dr. Mallie Mussel. History melanoma of the right chin and neck. Removed by Moh's surgery for 03/20/15.  Neurological: Negative for dizziness, syncope, speech difficulty, weakness, numbness and headaches.       History of dementia. Currently on Aricept.  Hematological: Negative for adenopathy.       History myelodysplasia with anemia. Followed by Dr. Alvy Bimler.  Psychiatric/Behavioral: Positive for confusion and sleep disturbance (difficulty arousing from sleep in the morning. Hypersomnia.). Negative for agitation, behavioral problems and dysphoric mood. The patient is not nervous/anxious.     Immunization History  Administered Date(s) Administered  . Influenza Split 10/17/2011, 09/14/2012  . Influenza Whole 10/17/2007  . Influenza, High Dose Seasonal PF 10/11/2013, 10/05/2014  . Influenza-Unspecified 09/05/2015, 10/02/2016   . PPD Test 01/12/2015  . Pneumococcal Conjugate-13 09/29/2014  . Pneumococcal Polysaccharide-23 12/16/1998  . Tdap 04/23/2011  . Zoster 07/04/2006   Pertinent  Health Maintenance Due  Topic Date Due  . OPHTHALMOLOGY EXAM  11/15/2014  . FOOT EXAM  11/30/2014  . HEMOGLOBIN A1C  10/11/2015  . INFLUENZA VACCINE  Completed  . DEXA SCAN  Completed  . PNA vac Low Risk Adult  Completed   Fall Risk  07/04/2016 01/04/2016 04/06/2015 09/29/2014  Falls in the past year? No No No No   Functional Status Survey:    Vitals:   08/07/16 0949  BP: (!) 144/60  Pulse: 80  Resp: 16  Temp: 98.6 F (37 C)  Weight: 155 lb 12.8 oz (70.7 kg)  Height: 4\' 11"  (1.499 m)   Body mass index is 31.47 kg/m. Physical Exam  Constitutional: She is oriented to person, place, and time. She appears well-developed and well-nourished.  Blood pressure low normal  HENT:  Head:  Normocephalic and atraumatic.  Right Ear: External ear normal.  Left Ear: External ear normal.  Mouth/Throat: Oropharynx is clear and moist.  Eyes: Conjunctivae and EOM are normal.  Neck: Normal range of motion. Neck supple. No JVD present. No thyromegaly present.  Cardiovascular: Normal rate, regular rhythm, normal heart sounds and intact distal pulses.   No murmur heard. Pulmonary/Chest: Effort normal. She has no wheezes. She has rales.  Posterior mid to lower lung rales.   Abdominal: Soft. Bowel sounds are normal. She exhibits no distension and no mass. There is no tenderness. There is no rebound and no guarding.  Lower midline scar  Genitourinary: Vagina normal. Rectal exam shows guaiac negative stool.  Musculoskeletal: Normal range of motion. She exhibits no edema or tenderness.  Bilateral total knee replacement scars. Unstable gait. Decreased range of motion at shoulders.  Neurological: She is alert and oriented to person, place, and time. She has normal reflexes. No cranial nerve deficit. She exhibits normal muscle tone.  Coordination normal.  07/04/16 MMSE 27/30. Failed clock drawing.  Skin: Skin is warm and dry. No rash noted.  Scar right chin and neck. Bilateral onychomycosis of the toes.  Psychiatric: She has a normal mood and affect. Her behavior is normal.  Mild confusion    Labs reviewed:  Recent Labs  01/09/16 04/09/16 08/06/16 1059  NA 141 142 142  K 4.9 4.9 4.4  CO2  --   --  23  GLUCOSE  --   --  102  BUN 32* 34* 35.0*  CREATININE 1.2* 1.1 1.5*  CALCIUM  --   --  10.4    Recent Labs  01/09/16 04/09/16 08/06/16 1059  AST 11* 15 19  ALT 13 21 24   ALKPHOS 56 62 69  BILITOT  --   --  0.61  PROT  --   --  6.7  ALBUMIN  --   --  3.9    Recent Labs  01/09/16 04/09/16 08/06/16 1058  WBC 7.7 6.2 8.2  NEUTROABS  --   --  5.7  HGB 9.5* 9.7* 10.4*  HCT 29* 29* 31.1*  MCV  --   --  85.7  PLT 359 372 368   Lab Results  Component Value Date   TSH 1.98 04/09/2016   Lab Results  Component Value Date   HGBA1C 5.6 04/11/2015   Lab Results  Component Value Date   CHOL 201 (H) 11/30/2013   HDL 53 01/09/2016   LDLCALC 73 01/09/2016   LDLDIRECT 131.2 11/30/2013   TRIG 155.0 (H) 11/30/2013   CHOLHDL 4 11/30/2013    Significant Diagnostic Results in last 30 days:  No results found.  Assessment/Plan There are no diagnoses linked to this encounter.Deficiency anemia 08/06/16 f/u anemia, Hgb 10.4, has not needed any Aranesp since 2015, dc from the clinic. May see Dr Alvy Bimler again in the future if Hgb <9  Anemia of chronic disease 08/06/16 f/u anemia, Hgb 10.4, has not needed any Aranesp since 2015, dc from the clinic. May see Dr Alvy Bimler again in the future if Hgb <9   Essential hypertension Controlled, continue Losartan 100mg  daily. 04/09/16 Na 142, K 4.9, Bun 34, creat 1.08, update CMP   Constipation Stable, diet and prn daily Miralax to ensure regular BM    Hypothyroidism Last TSH 1.98 04/09/16, continue Levothyroxine 1mcg daily, update TSH   Dementia Seen by  neurology at [facility] in November 2013. MRI revealed multi-infarct dementia. Aricept prescribed 04/06/15 MMSE 25/30, failed clock drawing.  Continue  Aricept    Chronic kidney disease 04/09/16 Na 142, K 4.9, Bun 34, creat 1.08   Myelodysplastic syndrome (HCC) dc'd from oncology, taking Vit B12, last Hgb 10.4, no longer needed any Aranesp, contact Dr Alvy Bimler if Hgb 9g/dl in the future. Update CBC   Back pain On and off-prn Tylenol is adequate for her back pain. Ambulates with walker independently.      Family/ staff Communication: continue AL for care assistance.   Labs/tests ordered: CBC CMP TSH

## 2016-08-07 NOTE — Assessment & Plan Note (Addendum)
Controlled, continue Losartan 100mg  daily. 04/09/16 Na 142, K 4.9, Bun 34, creat 1.08, update CMP

## 2016-08-07 NOTE — Assessment & Plan Note (Signed)
Seen by neurology at [facility] in November 2013. MRI revealed multi-infarct dementia. Aricept prescribed 04/06/15 MMSE 25/30, failed clock drawing.  Continue Aricept  

## 2016-08-07 NOTE — Assessment & Plan Note (Signed)
08/06/16 f/u anemia, Hgb 10.4, has not needed any Aranesp since 2015, dc from the clinic. May see Dr Gorsuch again in the future if Hgb <9  

## 2016-08-07 NOTE — Assessment & Plan Note (Addendum)
Last TSH 1.98 04/09/16, continue Levothyroxine 64mcg daily, update TSH

## 2016-08-07 NOTE — Assessment & Plan Note (Signed)
04/09/16 Na 142, K 4.9, Bun 34, creat 1.08

## 2016-08-07 NOTE — Assessment & Plan Note (Signed)
Stable, diet and prn daily Miralax to ensure regular BM 

## 2016-08-07 NOTE — Assessment & Plan Note (Addendum)
dc'd from oncology, taking Vit B12, last Hgb 10.4, no longer needed any Aranesp, contact Dr Alvy Bimler if Hgb 9g/dl in the future. Update CBC

## 2016-09-11 ENCOUNTER — Non-Acute Institutional Stay: Payer: Medicare Other | Admitting: Nurse Practitioner

## 2016-09-11 ENCOUNTER — Encounter: Payer: Self-pay | Admitting: Nurse Practitioner

## 2016-09-11 DIAGNOSIS — M5440 Lumbago with sciatica, unspecified side: Secondary | ICD-10-CM

## 2016-09-11 DIAGNOSIS — F0390 Unspecified dementia without behavioral disturbance: Secondary | ICD-10-CM | POA: Diagnosis not present

## 2016-09-11 DIAGNOSIS — K5900 Constipation, unspecified: Secondary | ICD-10-CM

## 2016-09-11 DIAGNOSIS — I10 Essential (primary) hypertension: Secondary | ICD-10-CM | POA: Diagnosis not present

## 2016-09-11 DIAGNOSIS — E039 Hypothyroidism, unspecified: Secondary | ICD-10-CM | POA: Diagnosis not present

## 2016-09-11 DIAGNOSIS — D638 Anemia in other chronic diseases classified elsewhere: Secondary | ICD-10-CM | POA: Diagnosis not present

## 2016-09-11 DIAGNOSIS — D469 Myelodysplastic syndrome, unspecified: Secondary | ICD-10-CM

## 2016-09-11 DIAGNOSIS — N182 Chronic kidney disease, stage 2 (mild): Secondary | ICD-10-CM

## 2016-09-11 NOTE — Assessment & Plan Note (Signed)
Last TSH 1.98 04/09/16, continue Levothyroxine 48mcg daily

## 2016-09-11 NOTE — Assessment & Plan Note (Signed)
Seen by neurology at [facility] in November 2013. MRI revealed multi-infarct dementia. Aricept prescribed 04/06/15 MMSE 25/30, failed clock drawing.  Continue Aricept

## 2016-09-11 NOTE — Assessment & Plan Note (Signed)
08/06/16 Na 142, K 4.4, Bun 35, creat 1.5, Hgb 10.4

## 2016-09-11 NOTE — Assessment & Plan Note (Signed)
Stable, diet and prn daily Miralax to ensure regular BM

## 2016-09-11 NOTE — Assessment & Plan Note (Signed)
08/06/16 Na 142, K 4.4, Bun 35, creat 1.5, Hgb 10.4 Controlled, continue Losartan 100mg  daily.

## 2016-09-11 NOTE — Assessment & Plan Note (Signed)
08/06/16 f/u anemia, Hgb 10.4, has not needed any Aranesp since 2015, dc from the clinic. May see Dr Alvy Bimler again in the future if Hgb <9

## 2016-09-11 NOTE — Assessment & Plan Note (Signed)
On and off-prn Tylenol is adequate for her back pain. Ambulates with walker independently.

## 2016-09-11 NOTE — Assessment & Plan Note (Signed)
dc'd from oncology, taking Vit B12, last Hgb 10.4, no longer needed any Aranesp, contact Dr Alvy Bimler if Hgb 9g/dl in the future.

## 2016-09-11 NOTE — Progress Notes (Signed)
Location:  [facility] Room Number: 802 Place of Service:  ALF [phone]) Provider:  Cathren Sween, Manxie  NP  Jeanmarie Hubert, MD  Patient Care Team: Estill Dooms, MD as PCP - General (Internal Medicine) Milus Banister, MD as Consulting Physician (Gastroenterology) Heath Lark, MD as Consulting Physician (Hematology and Oncology)  Extended Emergency Contact Information Primary Emergency Contact: [facility] Address: [address]          [place], [place] [address] [name] of [place] Phone: [phone] Work Phone: [phone] Mobile Phone: [phone] Relation: Son Secondary Emergency Contact: [name],[name] Address: [place] [address] [name] of [place] Phone: [phone] Relation: Daughter  Code Status: DNR Goals of care: Advanced Directive information Advanced Directives 09/11/2016  Does patient have an advance directive? Yes  Type of Advance Directive Out of facility DNR (pink MOST or yellow form)  Does patient want to make changes to advanced directive? No - Patient declined  Copy of advanced directive(s) in chart? Yes  Would patient like information on creating an advanced directive? -  Pre-existing out of facility DNR order (yellow form or pink MOST form) -     Chief Complaint  Patient presents with  . Medical Management of Chronic Issues    HPI:  Pt is a 82 y.o. female seen today for medical management of chronic diseases.     Hx of HTN, controlled on Losartan 100mg  daily, last TSH wnl 3 months ago while taking Levothyroxine 50mcg, memory is preserved with Aricept 10mg  daily, able to function well in AL setting. Mild low Hgb 9.5, elevated Bun/creat 32/1.2 from 01/08/15, on B12 1028mcg po daily.  Past Medical History:  Diagnosis Date  . Blood transfusion 2006  . Colon polyps   . CVA (cerebral infarction) 2013  . Dementia   . Diverticulosis of colon   . DJD (degenerative joint disease)   .  Hyperlipidemia   . Hypertension   . Hypothyroidism   . Melanoma ([place]) 03/20/15   removed with Moh's surgery  . Myelodysplasia 02/04/2012  . Normochromic normocytic anemia 02/04/2012  . Onychomycosis   . Osteoporosis   . Rectal prolapse   . Shoulder pain   . Unstable gait    Past Surgical History:  Procedure Laterality Date  . APPENDECTOMY    . CARPAL TUNNEL RELEASE Bilateral 2006  . FLEXIBLE SIGMOIDOSCOPY  07/09/2012   Procedure: FLEXIBLE SIGMOIDOSCOPY;  Surgeon: Inda Castle, MD;  Location: [facility];  Service: Endoscopy;  Laterality: N/A;  . MELANOMA EXCISION Right 2005   arm  . POLYPECTOMY  2004   Laparoscopic  . RECTOCELE REPAIR    . REPLACEMENT TOTAL KNEE  2005 & 2006  . VAGINAL HYSTERECTOMY  1989    Allergies  Allergen Reactions  . Lisinopril     REACTION: angioedema  . Penicillins     REACTION: rash      Medication List       Accurate as of 09/11/16  5:00 PM. Always use your most recent med list.          acetaminophen 500 MG tablet Commonly known as:  TYLENOL 500 mg one every 8 hours as needed   aspirin EC 81 MG tablet Take 81 mg by mouth daily.   CALTRATE 600+D PLUS 600-400 MG-UNIT per tablet Take 1 tablet by mouth daily.   cycloSPORINE 0.05 % ophthalmic emulsion Commonly known as:  RESTASIS Place 1 drop into  both eyes 2 (two) times daily.   donepezil 10 MG tablet Commonly known as:  ARICEPT Take 10 mg by mouth daily.   levothyroxine 88 MCG tablet Commonly known as:  SYNTHROID, LEVOTHROID TAKE 1 TABLET BY MOUTH ONCE DAILY   losartan 100 MG tablet Commonly known as:  COZAAR TAKE 1 TABLET EVERY DAY FOR BLOOD PRESSURE   multivitamin with minerals Tabs tablet Take 1 tablet by mouth daily.   polyethylene glycol powder powder Commonly known as:  GLYCOLAX/MIRALAX Take 17 g by mouth daily as needed.   vitamin B-12 1000 MCG tablet Commonly known as:  CYANOCOBALAMIN Take 1,000 mcg by mouth daily.   VITAMIN D-3 PO Take by mouth. 1,000mg   one daily       Review of Systems  Constitutional: Negative.   HENT: Negative for congestion, dental problem, hearing loss, rhinorrhea, sinus pressure, sore throat and tinnitus.   Eyes: Negative for pain, discharge and visual disturbance.  Respiratory: Positive for cough. Negative for shortness of breath.   Cardiovascular: Negative for chest pain, palpitations and leg swelling.  Gastrointestinal: Positive for constipation. Negative for abdominal distention, abdominal pain, blood in stool, diarrhea, nausea and vomiting.  Endocrine:       History of hypothyroidism  Genitourinary: Negative for difficulty urinating, dysuria, flank pain, frequency, hematuria, pelvic pain, urgency, vaginal bleeding, vaginal discharge and vaginal pain.  Musculoskeletal: Positive for back pain and gait problem. Negative for arthralgias and joint swelling.       Bilateral shoulder discomfort and limited range of motion.  Skin: Negative for rash.       History of onychomycosis followed by Dr. Mallie Mussel. History melanoma of the right chin and neck. Removed by Moh's surgery for 03/20/15.  Neurological: Negative for dizziness, syncope, speech difficulty, weakness, numbness and headaches.       History of dementia. Currently on Aricept.  Hematological: Negative for adenopathy.       History myelodysplasia with anemia. Followed by Dr. Alvy Bimler.  Psychiatric/Behavioral: Positive for confusion and sleep disturbance (difficulty arousing from sleep in the morning. Hypersomnia.). Negative for agitation, behavioral problems and dysphoric mood. The patient is not nervous/anxious.     Immunization History  Administered Date(s) Administered  . Influenza Split 10/17/2011, 09/14/2012  . Influenza Whole 10/17/2007  . Influenza, High Dose Seasonal PF 10/11/2013, 10/05/2014  . Influenza-Unspecified 09/05/2015  . PPD Test 01/12/2015  . Pneumococcal Conjugate-13 09/29/2014  . Pneumococcal Polysaccharide-23 12/16/1998  . Tdap  04/23/2011  . Zoster 07/04/2006   Pertinent  Health Maintenance Due  Topic Date Due  . OPHTHALMOLOGY EXAM  11/15/2014  . FOOT EXAM  11/30/2014  . HEMOGLOBIN A1C  10/11/2015  . INFLUENZA VACCINE  07/16/2016  . DEXA SCAN  Completed  . PNA vac Low Risk Adult  Completed   Fall Risk  07/04/2016 01/04/2016 04/06/2015 09/29/2014  Falls in the past year? No No No No   Functional Status Survey:    Vitals:   09/11/16 1156  BP: 110/60  Pulse: 77  Resp: 18  Temp: 98.5 F (36.9 C)  Weight: 155 lb 12.8 oz (70.7 kg)  Height: 4\' 11"  (1.499 m)   Body mass index is 31.47 kg/m. Physical Exam  Constitutional: She is oriented to person, place, and time. She appears well-developed and well-nourished.  Blood pressure low normal  HENT:  Head: Normocephalic and atraumatic.  Right Ear: External ear normal.  Left Ear: External ear normal.  Mouth/Throat: Oropharynx is clear and moist.  Eyes: Conjunctivae and EOM are normal.  Neck:  Normal range of motion. Neck supple. No JVD present. No thyromegaly present.  Cardiovascular: Normal rate, regular rhythm and intact distal pulses.   Murmur heard. 3/6   Pulmonary/Chest: Effort normal. She has no wheezes. She has rales.  Posterior mid to lower lung rales.   Abdominal: Soft. Bowel sounds are normal. She exhibits no distension and no mass. There is no tenderness. There is no rebound and no guarding.  Lower midline scar  Genitourinary: Vagina normal. Rectal exam shows guaiac negative stool.  Musculoskeletal: Normal range of motion. She exhibits no edema or tenderness.  Bilateral total knee replacement scars. Unstable gait. Decreased range of motion at shoulders.  Neurological: She is alert and oriented to person, place, and time. She has normal reflexes. No cranial nerve deficit. She exhibits normal muscle tone. Coordination normal.  07/04/16 MMSE 27/30. Failed clock drawing.  Skin: Skin is warm and dry. No rash noted.  Scar right chin and  neck. Bilateral onychomycosis of the toes.  Psychiatric: She has a normal mood and affect. Her behavior is normal.  Mild confusion    Labs reviewed:  Recent Labs  01/09/16 04/09/16 08/06/16 1059  NA 141 142 142  K 4.9 4.9 4.4  CO2  --   --  23  GLUCOSE  --   --  102  BUN 32* 34* 35.0*  CREATININE 1.2* 1.1 1.5*  CALCIUM  --   --  10.4    Recent Labs  01/09/16 04/09/16 08/06/16 1059  AST 11* 15 19  ALT 13 21 24   ALKPHOS 56 62 69  BILITOT  --   --  0.61  PROT  --   --  6.7  ALBUMIN  --   --  3.9    Recent Labs  01/09/16 04/09/16 08/06/16 1058  WBC 7.7 6.2 8.2  NEUTROABS  --   --  5.7  HGB 9.5* 9.7* 10.4*  HCT 29* 29* 31.1*  MCV  --   --  85.7  PLT 359 372 368   Lab Results  Component Value Date   TSH 1.98 04/09/2016   Lab Results  Component Value Date   HGBA1C 5.6 04/11/2015   Lab Results  Component Value Date   CHOL 201 (H) 11/30/2013   HDL 53 01/09/2016   LDLCALC 73 01/09/2016   LDLDIRECT 131.2 11/30/2013   TRIG 155.0 (H) 11/30/2013   CHOLHDL 4 11/30/2013    Significant Diagnostic Results in last 30 days:  No results found.  Assessment/Plan Essential hypertension 08/06/16 Na 142, K 4.4, Bun 35, creat 1.5, Hgb 10.4 Controlled, continue Losartan 100mg  daily.   Constipation Stable, diet and prn daily Miralax to ensure regular BM    Hypothyroidism Last TSH 1.98 04/09/16, continue Levothyroxine 29mcg daily   Dementia Seen by neurology at [facility] in November 2013. MRI revealed multi-infarct dementia. Aricept prescribed 04/06/15 MMSE 25/30, failed clock drawing.  Continue Aricept    Chronic kidney disease 08/06/16 Na 142, K 4.4, Bun 35, creat 1.5, Hgb 10.4   Myelodysplastic syndrome ([facility]) dc'd from oncology, taking Vit B12, last Hgb 10.4, no longer needed any Aranesp, contact Dr Alvy Bimler if Hgb 9g/dl in the future.    Anemia of chronic disease 08/06/16 f/u anemia, Hgb 10.4, has not needed any Aranesp since 2015, dc from the clinic.  May see Dr Alvy Bimler again in the future if Hgb <9   Back pain On and off-prn Tylenol is adequate for her back pain. Ambulates with walker independently.      Family/  staff Communication: continue AL for care assistance.   Labs/tests ordered: none

## 2016-10-23 ENCOUNTER — Non-Acute Institutional Stay: Payer: Medicare Other | Admitting: Nurse Practitioner

## 2016-10-23 ENCOUNTER — Encounter: Payer: Self-pay | Admitting: Nurse Practitioner

## 2016-10-23 DIAGNOSIS — K5900 Constipation, unspecified: Secondary | ICD-10-CM

## 2016-10-23 DIAGNOSIS — D469 Myelodysplastic syndrome, unspecified: Secondary | ICD-10-CM

## 2016-10-23 DIAGNOSIS — F0150 Vascular dementia without behavioral disturbance: Secondary | ICD-10-CM | POA: Diagnosis not present

## 2016-10-23 DIAGNOSIS — I10 Essential (primary) hypertension: Secondary | ICD-10-CM

## 2016-10-23 DIAGNOSIS — D638 Anemia in other chronic diseases classified elsewhere: Secondary | ICD-10-CM | POA: Diagnosis not present

## 2016-10-23 DIAGNOSIS — E039 Hypothyroidism, unspecified: Secondary | ICD-10-CM | POA: Diagnosis not present

## 2016-10-23 DIAGNOSIS — N182 Chronic kidney disease, stage 2 (mild): Secondary | ICD-10-CM | POA: Diagnosis not present

## 2016-10-23 NOTE — Progress Notes (Signed)
Location:  [facility] Room Number: 802 Place of Service:  ALF [phone]) Provider: Carol Loftin, Manxie  NP  Jeanmarie Hubert, MD  Patient Care Team: Estill Dooms, MD as PCP - General (Internal Medicine) Milus Banister, MD as Consulting Physician (Gastroenterology) Heath Lark, MD as Consulting Physician (Hematology and Oncology)  Extended Emergency Contact Information Primary Emergency Contact: [facility] Address: [address]          [place], [place] [address] [name] of [place] Phone: [phone] Work Phone: [phone] Mobile Phone: [phone] Relation: Son Secondary Emergency Contact: [name],[name] Address: [place] [address] [name] of [place] Phone: [phone] Relation: Daughter  Code Status:  DNR Goals of care: Advanced Directive information Advanced Directives 10/23/2016  Does patient have an advance directive? Yes  Type of Advance Directive Out of facility DNR (pink MOST or yellow form)  Does patient want to make changes to advanced directive? No - Patient declined  Copy of advanced directive(s) in chart? Yes  Would patient like information on creating an advanced directive? -  Pre-existing out of facility DNR order (yellow form or pink MOST form) -     Chief Complaint  Patient presents with  . Medical Management of Chronic Issues    HPI:  Pt is a 82 y.o. female seen today for medical management of chronic diseases.    Hx of HTN, controlled on Losartan 100mg  daily, last TSH wnl 10/24/16, while taking Levothyroxine 66mcg, memory is preserved with Aricept 10mg  daily, able to function well in AL setting. Mild low Hgb 9.7 10/24/16, on B12 1045mcg po daily.   Past Medical History:  Diagnosis Date  . Blood transfusion 2006  . Colon polyps   . CVA (cerebral infarction) 2013  . Dementia   . Diverticulosis of colon   . DJD (degenerative joint disease)   . Hyperlipidemia   . Hypertension   .  Hypothyroidism   . Melanoma ([place]) 03/20/15   removed with Moh's surgery  . Myelodysplasia 02/04/2012  . Normochromic normocytic anemia 02/04/2012  . Onychomycosis   . Osteoporosis   . Rectal prolapse   . Shoulder pain   . Unstable gait    Past Surgical History:  Procedure Laterality Date  . APPENDECTOMY    . CARPAL TUNNEL RELEASE Bilateral 2006  . FLEXIBLE SIGMOIDOSCOPY  07/09/2012   Procedure: FLEXIBLE SIGMOIDOSCOPY;  Surgeon: Inda Castle, MD;  Location: [facility];  Service: Endoscopy;  Laterality: N/A;  . MELANOMA EXCISION Right 2005   arm  . POLYPECTOMY  2004   Laparoscopic  . RECTOCELE REPAIR    . REPLACEMENT TOTAL KNEE  2005 & 2006  . VAGINAL HYSTERECTOMY  1989    Allergies  Allergen Reactions  . Lisinopril     REACTION: angioedema  . Penicillins     REACTION: rash      Medication List       Accurate as of 10/23/16 11:59 PM. Always use your most recent med list.          acetaminophen 500 MG tablet Commonly known as:  TYLENOL 500 mg one every 8 hours as needed   aspirin EC 81 MG tablet Take 81 mg by mouth daily.   CALTRATE 600+D PLUS 600-400 MG-UNIT per tablet Take 1 tablet by mouth daily.   cycloSPORINE 0.05 % ophthalmic emulsion Commonly known as:  RESTASIS Place 1 drop into both eyes 2 (two) times daily.  donepezil 10 MG tablet Commonly known as:  ARICEPT Take 10 mg by mouth daily.   levothyroxine 88 MCG tablet Commonly known as:  SYNTHROID, LEVOTHROID TAKE 1 TABLET BY MOUTH ONCE DAILY   losartan 100 MG tablet Commonly known as:  COZAAR TAKE 1 TABLET EVERY DAY FOR BLOOD PRESSURE   multivitamin with minerals Tabs tablet Take 1 tablet by mouth daily.   polyethylene glycol powder powder Commonly known as:  GLYCOLAX/MIRALAX Take 17 g by mouth daily as needed.   vitamin B-12 1000 MCG tablet Commonly known as:  CYANOCOBALAMIN Take 1,000 mcg by mouth daily.   VITAMIN D-3 PO Take by mouth. 1,000mg  one daily       Review of  Systems  Constitutional: Negative.   HENT: Negative for congestion, dental problem, hearing loss, rhinorrhea, sinus pressure, sore throat and tinnitus.   Eyes: Negative for pain, discharge and visual disturbance.  Respiratory: Positive for cough. Negative for shortness of breath.   Cardiovascular: Negative for chest pain, palpitations and leg swelling.  Gastrointestinal: Positive for constipation. Negative for abdominal distention, abdominal pain, blood in stool, diarrhea, nausea and vomiting.  Endocrine:       History of hypothyroidism  Genitourinary: Negative for difficulty urinating, dysuria, flank pain, frequency, hematuria, pelvic pain, urgency, vaginal bleeding, vaginal discharge and vaginal pain.  Musculoskeletal: Positive for back pain and gait problem. Negative for arthralgias and joint swelling.       Bilateral shoulder discomfort and limited range of motion.  Skin: Negative for rash.       History of onychomycosis followed by Dr. Mallie Mussel. History melanoma of the right chin and neck. Removed by Moh's surgery for 03/20/15.  Neurological: Negative for dizziness, syncope, speech difficulty, weakness, numbness and headaches.       History of dementia. Currently on Aricept.  Hematological: Negative for adenopathy.       History myelodysplasia with anemia. Followed by Dr. Alvy Bimler.  Psychiatric/Behavioral: Positive for confusion and sleep disturbance (difficulty arousing from sleep in the morning. Hypersomnia.). Negative for agitation, behavioral problems and dysphoric mood. The patient is not nervous/anxious.     Immunization History  Administered Date(s) Administered  . Influenza Split 10/17/2011, 09/14/2012  . Influenza Whole 10/17/2007  . Influenza, High Dose Seasonal PF 10/11/2013, 10/05/2014  . Influenza-Unspecified 09/05/2015, 10/02/2016  . PPD Test 01/12/2015  . Pneumococcal Conjugate-13 09/29/2014  . Pneumococcal Polysaccharide-23 12/16/1998  . Tdap 04/23/2011  . Zoster  07/04/2006   Pertinent  Health Maintenance Due  Topic Date Due  . OPHTHALMOLOGY EXAM  11/15/2014  . FOOT EXAM  11/30/2014  . HEMOGLOBIN A1C  10/11/2015  . INFLUENZA VACCINE  Completed  . DEXA SCAN  Completed  . PNA vac Low Risk Adult  Completed   Fall Risk  07/04/2016 01/04/2016 04/06/2015 09/29/2014  Falls in the past year? No No No No   Functional Status Survey:    Vitals:   10/23/16 1321  BP: 124/68  Pulse: 76  Resp: 16  Temp: 97.3 F (36.3 C)  Weight: 159 lb 12.8 oz (72.5 kg)  Height: 4\' 11"  (1.499 m)   Body mass index is 32.28 kg/m. Physical Exam  Constitutional: She is oriented to person, place, and time. She appears well-developed and well-nourished.  Blood pressure low normal  HENT:  Head: Normocephalic and atraumatic.  Right Ear: External ear normal.  Left Ear: External ear normal.  Mouth/Throat: Oropharynx is clear and moist.  Eyes: Conjunctivae and EOM are normal.  Neck: Normal range of motion. Neck supple. No  JVD present. No thyromegaly present.  Cardiovascular: Normal rate, regular rhythm and intact distal pulses.   Murmur heard. 3/6   Pulmonary/Chest: Effort normal. She has no wheezes. She has rales.  Posterior mid to lower lung rales.   Abdominal: Soft. Bowel sounds are normal. She exhibits no distension and no mass. There is no tenderness. There is no rebound and no guarding.  Lower midline scar  Genitourinary: Vagina normal. Rectal exam shows guaiac negative stool.  Musculoskeletal: Normal range of motion. She exhibits no edema or tenderness.  Bilateral total knee replacement scars. Unstable gait. Decreased range of motion at shoulders.  Neurological: She is alert and oriented to person, place, and time. She has normal reflexes. No cranial nerve deficit. She exhibits normal muscle tone. Coordination normal.  07/04/16 MMSE 27/30. Failed clock drawing.  Skin: Skin is warm and dry. No rash noted.  Scar right chin and neck. Bilateral onychomycosis of  the toes.  Psychiatric: She has a normal mood and affect. Her behavior is normal.  Mild confusion    Labs reviewed:  Recent Labs  04/09/16 08/06/16 1059 10/24/16  NA 142 142 142  K 4.9 4.4 4.8  CO2  --  23  --   GLUCOSE  --  102  --   BUN 34* 35.0* 28*  CREATININE 1.1 1.5* 1.1  CALCIUM  --  10.4  --     Recent Labs  04/09/16 08/06/16 1059 10/24/16  AST 15 19 17   ALT 21 24 21   ALKPHOS 62 69 61  BILITOT  --  0.61  --   PROT  --  6.7  --   ALBUMIN  --  3.9  --     Recent Labs  04/09/16 08/06/16 1058 10/24/16  WBC 6.2 8.2 7.4  NEUTROABS  --  5.7  --   HGB 9.7* 10.4* 9.7*  HCT 29* 31.1* 29*  MCV  --  85.7  --   PLT 372 368 365   Lab Results  Component Value Date   TSH 1.85 10/24/2016   Lab Results  Component Value Date   HGBA1C 5.6 04/11/2015   Lab Results  Component Value Date   CHOL 201 (H) 11/30/2013   HDL 53 01/09/2016   LDLCALC 73 01/09/2016   LDLDIRECT 131.2 11/30/2013   TRIG 155.0 (H) 11/30/2013   CHOLHDL 4 11/30/2013    Significant Diagnostic Results in last 30 days:  No results found.  Assessment/Plan Essential hypertension 08/06/16 Na 142, K 4.4, Bun 35, creat 1.5, Hgb 10.4 10/24/16 wbc 7.4, Hgb 9.7, plt 365, Na 142, K 4.8, Bun 28, creat 1.11, TSH 1.85 Controlled, continue Losartan 100mg  daily.    Constipation Stable, diet and prn daily Miralax to ensure regular BM  Hypothyroidism Last TSH 1.98 04/09/16, 1.85 10/24/16, continue Levothyroxine 48mcg daily   Dementia Seen by neurology at [facility] in November 2013. MRI revealed multi-infarct dementia. Aricept prescribed 04/06/15 MMSE 25/30, failed clock drawing.  Continue Aricept     Chronic kidney disease 10/24/16 Bun 28, creat 1.11  Myelodysplastic syndrome ([place]) dc'd from oncology, taking Vit B12, last Hgb 9.7 10/24/16, no longer needed any Aranesp, contact Dr Alvy Bimler if Hgb 9g/dl in the future.     Anemia of chronic disease 10/24/16 Hgb 9.7, has not needed any Aranesp  since 2015, dc from the clinic. May see Dr Alvy Bimler again in the future if Hgb <9      Family/ staff Communication: continue AL  Labs/tests ordered:  CBC CMP TSH done 10/24/16

## 2016-10-24 ENCOUNTER — Other Ambulatory Visit: Payer: Self-pay | Admitting: *Deleted

## 2016-10-24 LAB — BASIC METABOLIC PANEL
BUN: 28 mg/dL — AB (ref 4–21)
CREATININE: 1.1 mg/dL (ref <=1.1)
GLUCOSE: 86 mg/dL
POTASSIUM: 4.8 mmol/L (ref 3.4–5.3)
Sodium: 142 mmol/L (ref 137–147)

## 2016-10-24 LAB — HEPATIC FUNCTION PANEL
ALT: 21 U/L (ref 7–35)
AST: 17 U/L (ref 13–35)
Alkaline Phosphatase: 61 U/L (ref 25–125)
Bilirubin, Total: 0.4 mg/dL

## 2016-10-24 LAB — CBC AND DIFFERENTIAL
HCT: 29 % — AB (ref 36–46)
Hemoglobin: 9.7 g/dL — AB (ref 12.0–16.0)
PLATELETS: 365 10*3/uL (ref 150–399)
WBC: 7.4 10*3/mL

## 2016-10-24 LAB — TSH: TSH: 1.85 u[IU]/mL (ref <=5.90)

## 2016-10-30 NOTE — Assessment & Plan Note (Signed)
Last TSH 1.98 04/09/16, 1.85 10/24/16, continue Levothyroxine 67mcg daily

## 2016-10-30 NOTE — Assessment & Plan Note (Addendum)
08/06/16 Na 142, K 4.4, Bun 35, creat 1.5, Hgb 10.4 10/24/16 wbc 7.4, Hgb 9.7, plt 365, Na 142, K 4.8, Bun 28, creat 1.11, TSH 1.85 Controlled, continue Losartan 100mg  daily.

## 2016-10-30 NOTE — Assessment & Plan Note (Signed)
10/24/16 Hgb 9.7, has not needed any Aranesp since 2015, dc from the clinic. May see Dr Alvy Bimler again in the future if Hgb <9

## 2016-10-30 NOTE — Assessment & Plan Note (Signed)
dc'd from oncology, taking Vit B12, last Hgb 9.7 10/24/16, no longer needed any Aranesp, contact Dr Alvy Bimler if Hgb 9g/dl in the future.

## 2016-10-30 NOTE — Assessment & Plan Note (Signed)
Seen by neurology at [facility] in November 2013. MRI revealed multi-infarct dementia. Aricept prescribed 04/06/15 MMSE 25/30, failed clock drawing.  Continue Aricept

## 2016-10-30 NOTE — Assessment & Plan Note (Signed)
10/24/16 Bun 28, creat 1.11

## 2016-10-30 NOTE — Assessment & Plan Note (Signed)
Stable, diet and prn daily Miralax to ensure regular BM

## 2016-11-28 ENCOUNTER — Encounter: Payer: Self-pay | Admitting: Nurse Practitioner

## 2016-11-28 ENCOUNTER — Non-Acute Institutional Stay: Payer: Medicare Other | Admitting: Nurse Practitioner

## 2016-11-28 DIAGNOSIS — I10 Essential (primary) hypertension: Secondary | ICD-10-CM | POA: Diagnosis not present

## 2016-11-28 DIAGNOSIS — N182 Chronic kidney disease, stage 2 (mild): Secondary | ICD-10-CM

## 2016-11-28 DIAGNOSIS — D469 Myelodysplastic syndrome, unspecified: Secondary | ICD-10-CM

## 2016-11-28 DIAGNOSIS — M5440 Lumbago with sciatica, unspecified side: Secondary | ICD-10-CM | POA: Diagnosis not present

## 2016-11-28 DIAGNOSIS — F0150 Vascular dementia without behavioral disturbance: Secondary | ICD-10-CM | POA: Diagnosis not present

## 2016-11-28 DIAGNOSIS — D638 Anemia in other chronic diseases classified elsewhere: Secondary | ICD-10-CM | POA: Diagnosis not present

## 2016-11-28 DIAGNOSIS — E039 Hypothyroidism, unspecified: Secondary | ICD-10-CM

## 2016-11-28 DIAGNOSIS — K5900 Constipation, unspecified: Secondary | ICD-10-CM

## 2016-11-28 NOTE — Assessment & Plan Note (Signed)
TSH 1.85 10/24/16, continue Levothyroxine 48mcg daily

## 2016-11-28 NOTE — Assessment & Plan Note (Signed)
10/24/16 wbc 7.4, Hgb 9.7, plt 365, Na 142, K 4.8, Bun 28, creat 1.11, TSH 1.85 Controlled, continue Losartan 100mg  daily.

## 2016-11-28 NOTE — Assessment & Plan Note (Signed)
Seen by neurology at [facility] in November 2013. MRI revealed multi-infarct dementia. Aricept prescribed 04/06/15 MMSE 25/30, failed clock drawing.  Continue Aricept

## 2016-11-28 NOTE — Assessment & Plan Note (Signed)
10/24/16 Bun 28, creat 1.11

## 2016-11-28 NOTE — Assessment & Plan Note (Signed)
10/24/16 Hgb 9.7, has not needed any Aranesp since 2015, dc from the clinic. May see Dr Alvy Bimler again in the future if Hgb <9

## 2016-11-28 NOTE — Progress Notes (Signed)
Location:  [facility] Room Number: 802 Place of Service:  ALF [phone]) Provider: Cailynn Bodnar, [place]  NP  Jeanmarie Hubert, MD  Patient Care Team: Estill Dooms, MD as PCP - General (Internal Medicine) Milus Banister, MD as Consulting Physician (Gastroenterology) Heath Lark, MD as Consulting Physician (Hematology and Oncology)  Extended Emergency Contact Information Primary Emergency Contact: [facility] Address: [address]          [place], [place] [address] [name] of [place] Phone: [phone] Work Phone: [phone] Mobile Phone: [phone] Relation: Son Secondary Emergency Contact: [name],[name] Address: [place] [address] [name] of [place] Phone: [phone] Relation: Daughter  Code Status:  DNR Goals of care: Advanced Directive information Advanced Directives 11/28/2016  Does Patient Have a Medical Advance Directive? Yes  Type of Advance Directive Out of facility DNR (pink MOST or yellow form)  Does patient want to make changes to medical advance directive? No - Patient declined  Copy of [place] in Chart? Yes  Would patient like information on creating a medical advance directive? -  Pre-existing out of facility DNR order (yellow form or pink MOST form) -     Chief Complaint  Patient presents with  . Medical Management of Chronic Issues    HPI:  Pt is a 82 y.o. female seen today for medical management of chronic diseases.    Hx of HTN, controlled on Losartan 100mg  daily, last TSH wnl 10/24/16, while taking Levothyroxine 76mcg, memory is preserved with Aricept 10mg  daily, able to function well in AL setting. Mild low Hgb 9.7 10/24/16, on B12 1062mcg po daily.   Past Medical History:  Diagnosis Date  . Blood transfusion 2006  . Colon polyps   . CVA (cerebral infarction) 2013  . Dementia   . Diverticulosis of colon   . DJD (degenerative joint disease)   . Hyperlipidemia     . Hypertension   . Hypothyroidism   . Melanoma ([place]) 03/20/15   removed with Moh's surgery  . Myelodysplasia 02/04/2012  . Normochromic normocytic anemia 02/04/2012  . Onychomycosis   . Osteoporosis   . Rectal prolapse   . Shoulder pain   . Unstable gait    Past Surgical History:  Procedure Laterality Date  . APPENDECTOMY    . CARPAL TUNNEL RELEASE Bilateral 2006  . FLEXIBLE SIGMOIDOSCOPY  07/09/2012   Procedure: FLEXIBLE SIGMOIDOSCOPY;  Surgeon: Inda Castle, MD;  Location: [facility];  Service: Endoscopy;  Laterality: N/A;  . MELANOMA EXCISION Right 2005   arm  . POLYPECTOMY  2004   Laparoscopic  . RECTOCELE REPAIR    . REPLACEMENT TOTAL KNEE  2005 & 2006  . VAGINAL HYSTERECTOMY  1989    Allergies  Allergen Reactions  . Lisinopril     REACTION: angioedema  . Penicillins     REACTION: rash      Medication List       Accurate as of 11/28/16  1:41 PM. Always use your most recent med list.          acetaminophen 500 MG tablet Commonly known as:  TYLENOL 500 mg one every 8 hours as needed   aspirin EC 81 MG tablet Take 81 mg by mouth daily.   CALTRATE 600+D PLUS 600-400 MG-UNIT per tablet Take 1 tablet by mouth daily.   cycloSPORINE 0.05 % ophthalmic emulsion Commonly known as:  RESTASIS Place 1 drop into  both eyes 2 (two) times daily.   donepezil 10 MG tablet Commonly known as:  ARICEPT Take 10 mg by mouth daily.   levothyroxine 88 MCG tablet Commonly known as:  SYNTHROID, LEVOTHROID TAKE 1 TABLET BY MOUTH ONCE DAILY   losartan 100 MG tablet Commonly known as:  COZAAR TAKE 1 TABLET EVERY DAY FOR BLOOD PRESSURE   multivitamin with minerals Tabs tablet Take 1 tablet by mouth daily.   polyethylene glycol powder powder Commonly known as:  GLYCOLAX/MIRALAX Take 17 g by mouth daily as needed.   vitamin B-12 1000 MCG tablet Commonly known as:  CYANOCOBALAMIN Take 1,000 mcg by mouth daily.   VITAMIN D-3 PO Take by mouth. 1,000mg  one daily        Review of Systems  Constitutional: Negative.   HENT: Negative for congestion, dental problem, hearing loss, rhinorrhea, sinus pressure, sore throat and tinnitus.   Eyes: Negative for pain, discharge and visual disturbance.  Respiratory: Positive for cough. Negative for shortness of breath.   Cardiovascular: Negative for chest pain, palpitations and leg swelling.  Gastrointestinal: Positive for constipation. Negative for abdominal distention, abdominal pain, blood in stool, diarrhea, nausea and vomiting.  Endocrine:       History of hypothyroidism  Genitourinary: Negative for difficulty urinating, dysuria, flank pain, frequency, hematuria, pelvic pain, urgency, vaginal bleeding, vaginal discharge and vaginal pain.  Musculoskeletal: Positive for back pain and gait problem. Negative for arthralgias and joint swelling.       Bilateral shoulder discomfort and limited range of motion.  Skin: Negative for rash.       History of onychomycosis followed by Dr. Mallie Mussel. History melanoma of the right chin and neck. Removed by Moh's surgery for 03/20/15.  Neurological: Negative for dizziness, syncope, speech difficulty, weakness, numbness and headaches.       History of dementia. Currently on Aricept.  Hematological: Negative for adenopathy.       History myelodysplasia with anemia. Followed by Dr. Alvy Bimler.  Psychiatric/Behavioral: Positive for confusion and sleep disturbance (difficulty arousing from sleep in the morning. Hypersomnia.). Negative for agitation, behavioral problems and dysphoric mood. The patient is not nervous/anxious.     Immunization History  Administered Date(s) Administered  . Influenza Split 10/17/2011, 09/14/2012  . Influenza Whole 10/17/2007  . Influenza, High Dose Seasonal PF 10/11/2013, 10/05/2014  . Influenza-Unspecified 09/05/2015, 10/02/2016  . PPD Test 01/12/2015  . Pneumococcal Conjugate-13 09/29/2014  . Pneumococcal Polysaccharide-23 12/16/1998  . Tdap 04/23/2011   . Zoster 07/04/2006   Pertinent  Health Maintenance Due  Topic Date Due  . OPHTHALMOLOGY EXAM  11/15/2014  . FOOT EXAM  11/30/2014  . HEMOGLOBIN A1C  10/11/2015  . INFLUENZA VACCINE  Completed  . DEXA SCAN  Completed  . PNA vac Low Risk Adult  Completed   Fall Risk  07/04/2016 01/04/2016 04/06/2015 09/29/2014  Falls in the past year? No No No No   Functional Status Survey:    Vitals:   11/28/16 1308  BP: 130/70  Pulse: 66  Resp: 18  Temp: 98.2 F (36.8 C)  Weight: 156 lb 12.8 oz (71.1 kg)  Height: 4\' 11"  (1.499 m)   Body mass index is 31.67 kg/m. Physical Exam  Constitutional: She is oriented to person, place, and time. She appears well-developed and well-nourished.  Blood pressure low normal  HENT:  Head: Normocephalic and atraumatic.  Right Ear: External ear normal.  Left Ear: External ear normal.  Mouth/Throat: Oropharynx is clear and moist.  Eyes: Conjunctivae and EOM are normal.  Neck: Normal range of motion. Neck supple. No JVD present. No thyromegaly present.  Cardiovascular: Normal rate, regular rhythm and intact distal pulses.   Murmur heard. 3/6   Pulmonary/Chest: Effort normal. She has no wheezes. She has rales.  Posterior mid to lower lung rales.   Abdominal: Soft. Bowel sounds are normal. She exhibits no distension and no mass. There is no tenderness. There is no rebound and no guarding.  Lower midline scar  Genitourinary: Vagina normal. Rectal exam shows guaiac negative stool.  Musculoskeletal: Normal range of motion. She exhibits no edema or tenderness.  Bilateral total knee replacement scars. Unstable gait. Decreased range of motion at shoulders.  Neurological: She is alert and oriented to person, place, and time. She has normal reflexes. No cranial nerve deficit. She exhibits normal muscle tone. Coordination normal.  07/04/16 MMSE 27/30. Failed clock drawing.  Skin: Skin is warm and dry. No rash noted.  Scar right chin and neck. Bilateral  onychomycosis of the toes.  Psychiatric: She has a normal mood and affect. Her behavior is normal.  Mild confusion    Labs reviewed:  Recent Labs  04/09/16 08/06/16 1059 10/24/16  NA 142 142 142  K 4.9 4.4 4.8  CO2  --  23  --   GLUCOSE  --  102  --   BUN 34* 35.0* 28*  CREATININE 1.1 1.5* 1.1  CALCIUM  --  10.4  --     Recent Labs  04/09/16 08/06/16 1059 10/24/16  AST 15 19 17   ALT 21 24 21   ALKPHOS 62 69 61  BILITOT  --  0.61  --   PROT  --  6.7  --   ALBUMIN  --  3.9  --     Recent Labs  04/09/16 08/06/16 1058 10/24/16  WBC 6.2 8.2 7.4  NEUTROABS  --  5.7  --   HGB 9.7* 10.4* 9.7*  HCT 29* 31.1* 29*  MCV  --  85.7  --   PLT 372 368 365   Lab Results  Component Value Date   TSH 1.85 10/24/2016   Lab Results  Component Value Date   HGBA1C 5.6 04/11/2015   Lab Results  Component Value Date   CHOL 201 (H) 11/30/2013   HDL 53 01/09/2016   LDLCALC 73 01/09/2016   LDLDIRECT 131.2 11/30/2013   TRIG 155.0 (H) 11/30/2013   CHOLHDL 4 11/30/2013    Significant Diagnostic Results in last 30 days:  No results found.  Assessment/Plan Essential hypertension 10/24/16 wbc 7.4, Hgb 9.7, plt 365, Na 142, K 4.8, Bun 28, creat 1.11, TSH 1.85 Controlled, continue Losartan 100mg  daily.     Constipation Stable, diet and prn daily Miralax to ensure regular BM  Hypothyroidism TSH 1.85 10/24/16, continue Levothyroxine 55mcg daily    Dementia Seen by neurology at [facility] in November 2013. MRI revealed multi-infarct dementia. Aricept prescribed 04/06/15 MMSE 25/30, failed clock drawing.  Continue Aricept   Chronic kidney disease 10/24/16 Bun 28, creat 1.11  Myelodysplastic syndrome ([place]) dc'd from oncology, taking Vit B12, last Hgb 9.7 10/24/16, no longer needed any Aranesp, contact Dr Alvy Bimler if Hgb 9g/dl in the future.    Anemia of chronic disease 10/24/16 Hgb 9.7, has not needed any Aranesp since 2015, dc from the clinic. May see Dr Alvy Bimler again in  the future if Hgb <9  Back pain On and off-prn Tylenol is adequate for her back pain. Ambulates with walker independently.       Family/ staff Communication:  AL  Labs/tests ordered:  none

## 2016-11-28 NOTE — Assessment & Plan Note (Signed)
dc'd from oncology, taking Vit B12, last Hgb 9.7 10/24/16, no longer needed any Aranesp, contact Dr Alvy Bimler if Hgb 9g/dl in the future.

## 2016-11-28 NOTE — Assessment & Plan Note (Signed)
Stable, diet and prn daily Miralax to ensure regular BM

## 2016-11-28 NOTE — Assessment & Plan Note (Signed)
On and off-prn Tylenol is adequate for her back pain. Ambulates with walker independently.

## 2017-01-02 ENCOUNTER — Encounter: Payer: Self-pay | Admitting: Internal Medicine

## 2017-01-06 ENCOUNTER — Non-Acute Institutional Stay: Payer: Medicare Other | Admitting: Internal Medicine

## 2017-01-06 ENCOUNTER — Encounter: Payer: Self-pay | Admitting: Internal Medicine

## 2017-01-06 DIAGNOSIS — N182 Chronic kidney disease, stage 2 (mild): Secondary | ICD-10-CM | POA: Diagnosis not present

## 2017-01-06 DIAGNOSIS — D469 Myelodysplastic syndrome, unspecified: Secondary | ICD-10-CM | POA: Diagnosis not present

## 2017-01-06 DIAGNOSIS — F0150 Vascular dementia without behavioral disturbance: Secondary | ICD-10-CM

## 2017-01-06 DIAGNOSIS — R2681 Unsteadiness on feet: Secondary | ICD-10-CM

## 2017-01-06 DIAGNOSIS — D638 Anemia in other chronic diseases classified elsewhere: Secondary | ICD-10-CM | POA: Diagnosis not present

## 2017-01-06 DIAGNOSIS — E039 Hypothyroidism, unspecified: Secondary | ICD-10-CM

## 2017-01-06 DIAGNOSIS — E785 Hyperlipidemia, unspecified: Secondary | ICD-10-CM

## 2017-01-06 DIAGNOSIS — I10 Essential (primary) hypertension: Secondary | ICD-10-CM | POA: Diagnosis not present

## 2017-01-06 NOTE — Progress Notes (Signed)
Progress Note    Location:  [facility] Room Number: [place] of Service:  ALF [phone]) Provider:  Jeanmarie Hubert, MD  Patient Care Team: Estill Dooms, MD as PCP - General (Internal Medicine) Milus Banister, MD as Consulting Physician (Gastroenterology) Heath Lark, MD as Consulting Physician (Hematology and Oncology)  Extended Emergency Contact Information Primary Emergency Contact: [facility] Address: [address]          [place], [place] [address] [name] of [place] Phone: [phone] Work Phone: [phone] Mobile Phone: [phone] Relation: Son Secondary Emergency Contact: [name],[name] Address: [place] [address] [name] of [place] Phone: [phone] Relation: Daughter  Code Status:  DNR Goals of care: Advanced Directive information Advanced Directives 01/06/2017  Does Patient Have a Medical Advance Directive? Yes  Type of Advance Directive Out of facility DNR (pink MOST or yellow form)  Does patient want to make changes to medical advance directive? -  Copy of [place] in Chart? -  Would patient like information on creating a medical advance directive? -  Pre-existing out of facility DNR order (yellow form or pink MOST form) Yellow form placed in chart (order not valid for inpatient use)     Chief Complaint  Patient presents with  . Medical Management of Chronic Issues    routine visit    HPI:  Pt is a 83 y.o. female seen today for medical management of chronic diseases.    Vascular dementia without behavioral disturbance -  o significant change in the last year  Essential hypertension   - controlled  Stage 2 chronic kidney disease - stable  Hyperlipidemia, unspecified hyperlipidemia  - follow up lab  Hypothyroidism, unspecified type - recheck TSH  Myelodysplastic syndrome ([place]) - stable  Unstable gait - using walker     Past Medical History:    Diagnosis Date  . Blood transfusion 2006  . Colon polyps   . CVA (cerebral infarction) 2013  . Dementia   . Diverticulosis of colon   . DJD (degenerative joint disease)   . Hyperlipidemia   . Hypertension   . Hypothyroidism   . Melanoma ([place]) 03/20/15   removed with Moh's surgery  . Myelodysplasia 02/04/2012  . Normochromic normocytic anemia 02/04/2012  . Onychomycosis   . Osteoporosis   . Rectal prolapse   . Shoulder pain   . Unstable gait    Past Surgical History:  Procedure Laterality Date  . APPENDECTOMY    . CARPAL TUNNEL RELEASE Bilateral 2006  . FLEXIBLE SIGMOIDOSCOPY  07/09/2012   Procedure: FLEXIBLE SIGMOIDOSCOPY;  Surgeon: Inda Castle, MD;  Location: [facility];  Service: Endoscopy;  Laterality: N/A;  . MELANOMA EXCISION Right 2005   arm  . POLYPECTOMY  2004   Laparoscopic  . RECTOCELE REPAIR    . REPLACEMENT TOTAL KNEE  2005 & 2006  . VAGINAL HYSTERECTOMY  1989    Allergies  Allergen Reactions  . Lisinopril     REACTION: angioedema  . Penicillins     REACTION: rash    Allergies as of 01/06/2017      Reactions   Lisinopril    REACTION: angioedema   Penicillins    REACTION: rash      Medication List       Accurate as of 01/06/17  2:24 PM. Always use your most recent med list.  acetaminophen 500 MG tablet Commonly known as:  TYLENOL 500 mg one every 8 hours as needed   aspirin EC 81 MG tablet Take 81 mg by mouth daily.   CALTRATE 600+D PLUS 600-400 MG-UNIT per tablet Take 1 tablet by mouth daily.   cycloSPORINE 0.05 % ophthalmic emulsion Commonly known as:  RESTASIS Place 1 drop into both eyes 2 (two) times daily.   donepezil 10 MG tablet Commonly known as:  ARICEPT Take 10 mg by mouth daily.   levothyroxine 88 MCG tablet Commonly known as:  SYNTHROID, LEVOTHROID TAKE 1 TABLET BY MOUTH ONCE DAILY   losartan 100 MG tablet Commonly known as:  COZAAR TAKE 1 TABLET EVERY DAY FOR BLOOD PRESSURE   multivitamin with  minerals Tabs tablet Take 1 tablet by mouth daily.   polyethylene glycol powder powder Commonly known as:  GLYCOLAX/MIRALAX Take 17 g by mouth daily as needed.   vitamin B-12 1000 MCG tablet Commonly known as:  CYANOCOBALAMIN Take 1,000 mcg by mouth daily.   VITAMIN D-3 PO Take by mouth. 1,000mg  one daily       Review of Systems  Constitutional: Negative.   HENT: Negative for congestion, dental problem, hearing loss, rhinorrhea, sinus pressure, sore throat and tinnitus.   Eyes: Negative for pain, discharge and visual disturbance.  Respiratory: Positive for cough. Negative for shortness of breath.   Cardiovascular: Negative for chest pain, palpitations and leg swelling.  Gastrointestinal: Positive for constipation. Negative for abdominal distention, abdominal pain, blood in stool, diarrhea, nausea and vomiting.  Endocrine:       History of hypothyroidism  Genitourinary: Negative for difficulty urinating, dysuria, flank pain, frequency, hematuria, pelvic pain, urgency, vaginal bleeding, vaginal discharge and vaginal pain.  Musculoskeletal: Positive for back pain and gait problem. Negative for arthralgias and joint swelling.       Bilateral shoulder discomfort and limited range of motion.  Skin: Negative for rash.       History of onychomycosis followed by Dr. Mallie Mussel. History melanoma of the right chin and neck. Removed by Moh's surgery for 03/20/15.  Neurological: Negative for dizziness, syncope, speech difficulty, weakness, numbness and headaches.       History of dementia. Currently on Aricept.  Hematological: Negative for adenopathy.       History myelodysplasia with anemia. Followed by Dr. Alvy Bimler.  Psychiatric/Behavioral: Positive for confusion and sleep disturbance (difficulty arousing from sleep in the morning. Hypersomnia.). Negative for agitation, behavioral problems and dysphoric mood. The patient is not nervous/anxious.     Immunization History  Administered Date(s)  Administered  . Influenza Split 10/17/2011, 09/14/2012  . Influenza Whole 10/17/2007  . Influenza, High Dose Seasonal PF 10/11/2013, 10/05/2014  . Influenza-Unspecified 09/05/2015, 10/02/2016  . PPD Test 01/12/2015  . Pneumococcal Conjugate-13 09/29/2014  . Pneumococcal Polysaccharide-23 12/16/1998  . Tdap 04/23/2011  . Zoster 07/04/2006   Pertinent  Health Maintenance Due  Topic Date Due  . OPHTHALMOLOGY EXAM  11/15/2014  . FOOT EXAM  11/30/2014  . HEMOGLOBIN A1C  10/11/2015  . INFLUENZA VACCINE  Completed  . DEXA SCAN  Completed  . PNA vac Low Risk Adult  Completed   Fall Risk  07/04/2016 01/04/2016 04/06/2015 09/29/2014  Falls in the past year? No No No No   Functional Status Survey:    Vitals:   01/06/17 1416  BP: 110/66  Pulse: 60  Resp: 20  Temp: 98.2 F (36.8 C)  Weight: 155 lb 8 oz (70.5 kg)  Height: 4\' 11"  (1.499 m)   Body  mass index is 31.41 kg/m. Physical Exam  Constitutional: She is oriented to person, place, and time. She appears well-developed and well-nourished.  Blood pressure low normal  HENT:  Head: Normocephalic and atraumatic.  Right Ear: External ear normal.  Left Ear: External ear normal.  Mouth/Throat: Oropharynx is clear and moist.  Eyes: Conjunctivae and EOM are normal.  Neck: Normal range of motion. Neck supple. No JVD present. No thyromegaly present.  Cardiovascular: Normal rate, regular rhythm and intact distal pulses.   Murmur heard. 3/6   Pulmonary/Chest: Effort normal. She has no wheezes. She has rales.  Posterior mid to lower lung rales.   Abdominal: Soft. Bowel sounds are normal. She exhibits no distension and no mass. There is no tenderness. There is no rebound and no guarding.  Lower midline scar  Genitourinary: Vagina normal. Rectal exam shows guaiac negative stool.  Musculoskeletal: Normal range of motion. She exhibits no edema or tenderness.  Bilateral total knee replacement scars. Unstable gait. Decreased range of motion  at shoulders.  Neurological: She is alert and oriented to person, place, and time. She has normal reflexes. No cranial nerve deficit. She exhibits normal muscle tone. Coordination normal.  07/04/16 MMSE 27/30. Failed clock drawing.  Skin: Skin is warm and dry. No rash noted.  Scar right chin and neck. Bilateral onychomycosis of the toes.  Psychiatric: She has a normal mood and affect. Her behavior is normal.  Mild confusion    Labs reviewed:  Recent Labs  04/09/16 08/06/16 1059 10/24/16  NA 142 142 142  K 4.9 4.4 4.8  CO2  --  23  --   GLUCOSE  --  102  --   BUN 34* 35.0* 28*  CREATININE 1.1 1.5* 1.1  CALCIUM  --  10.4  --     Recent Labs  04/09/16 08/06/16 1059 10/24/16  AST 15 19 17   ALT 21 24 21   ALKPHOS 62 69 61  BILITOT  --  0.61  --   PROT  --  6.7  --   ALBUMIN  --  3.9  --     Recent Labs  04/09/16 08/06/16 1058 10/24/16  WBC 6.2 8.2 7.4  NEUTROABS  --  5.7  --   HGB 9.7* 10.4* 9.7*  HCT 29* 31.1* 29*  MCV  --  85.7  --   PLT 372 368 365   Lab Results  Component Value Date   TSH 1.85 10/24/2016   Lab Results  Component Value Date   HGBA1C 5.6 04/11/2015   Lab Results  Component Value Date   CHOL 201 (H) 11/30/2013   HDL 53 01/09/2016   LDLCALC 73 01/09/2016   LDLDIRECT 131.2 11/30/2013   TRIG 155.0 (H) 11/30/2013   CHOLHDL 4 11/30/2013    Assessment/Plan  1. Vascular dementia without behavioral disturbance stable  2. Essential hypertension -CMP  3. Stage 2 chronic kidney disease -CMP  4. Hyperlipidemia, unspecified hyperlipidemia type -lipids  5. Hypothyroidism, unspecified type TSH  6. Myelodysplastic syndrome ([facility]) -CBC  7. Unstable gait Continue use of walkeer  8. Anemia of chronic disease Resolved under myelodysplasia

## 2017-01-07 LAB — CBC AND DIFFERENTIAL
HCT: 30 % — AB (ref 36–46)
Hemoglobin: 9.9 g/dL — AB (ref 12.0–16.0)
Platelets: 406 10*3/uL — AB (ref 150–399)
WBC: 6.7 10*3/mL

## 2017-01-07 LAB — HEPATIC FUNCTION PANEL
ALT: 20 U/L (ref 7–35)
AST: 15 U/L (ref 13–35)
Alkaline Phosphatase: 59 U/L (ref 25–125)
Bilirubin, Total: 0.5 mg/dL

## 2017-01-07 LAB — BASIC METABOLIC PANEL
BUN: 30 mg/dL — AB (ref 4–21)
CREATININE: 1.2 mg/dL — AB (ref <=1.1)
GLUCOSE: 86 mg/dL
POTASSIUM: 4.9 mmol/L (ref 3.4–5.3)
Sodium: 140 mmol/L (ref 137–147)

## 2017-01-07 LAB — TSH: TSH: 1.99 u[IU]/mL (ref <=5.90)

## 2017-01-07 LAB — LIPID PANEL: LDL Cholesterol: 92 mg/dL

## 2017-01-08 ENCOUNTER — Other Ambulatory Visit: Payer: Self-pay | Admitting: *Deleted

## 2017-01-15 ENCOUNTER — Other Ambulatory Visit: Payer: Self-pay | Admitting: *Deleted

## 2017-02-06 ENCOUNTER — Encounter: Payer: Self-pay | Admitting: Nurse Practitioner

## 2017-02-06 ENCOUNTER — Non-Acute Institutional Stay: Payer: Medicare Other | Admitting: Nurse Practitioner

## 2017-02-06 DIAGNOSIS — K5900 Constipation, unspecified: Secondary | ICD-10-CM

## 2017-02-06 DIAGNOSIS — F0150 Vascular dementia without behavioral disturbance: Secondary | ICD-10-CM

## 2017-02-06 DIAGNOSIS — N182 Chronic kidney disease, stage 2 (mild): Secondary | ICD-10-CM | POA: Diagnosis not present

## 2017-02-06 DIAGNOSIS — I10 Essential (primary) hypertension: Secondary | ICD-10-CM

## 2017-02-06 DIAGNOSIS — D469 Myelodysplastic syndrome, unspecified: Secondary | ICD-10-CM | POA: Diagnosis not present

## 2017-02-06 DIAGNOSIS — E039 Hypothyroidism, unspecified: Secondary | ICD-10-CM

## 2017-02-06 NOTE — Assessment & Plan Note (Signed)
continue Levothyroxine 31mcg daily, TSH 1.99 01/07/17

## 2017-02-06 NOTE — Progress Notes (Signed)
Location:  [facility] [place] Room Number: 802 Place of Service:  ALF [phone]) Provider:  Buel Molder, Manxie  NP  Jeanmarie Hubert, MD  Patient Care Team: Estill Dooms, MD as PCP - General (Internal Medicine) Milus Banister, MD as Consulting Physician (Gastroenterology) Heath Lark, MD as Consulting Physician (Hematology and Oncology)  Extended Emergency Contact Information Primary Emergency Contact: [facility] Address: [address]          [place], [place] [address] [name] of [place] Phone: [phone] Work Phone: [phone] Mobile Phone: [phone] Relation: Son Secondary Emergency Contact: [name],[name] Address: [place] [address] [name] of [place] Phone: [phone] Relation: Daughter  Code Status:  DNR Goals of care: Advanced Directive information Advanced Directives 02/06/2017  Does Patient Have a Medical Advance Directive? Yes  Type of Advance Directive Out of facility DNR (pink MOST or yellow form)  Does patient want to make changes to medical advance directive? No - Patient declined  Copy of [place] in Chart? -  Would patient like information on creating a medical advance directive? -  Pre-existing out of facility DNR order (yellow form or pink MOST form) Yellow form placed in chart (order not valid for inpatient use)     Chief Complaint  Patient presents with  . Medical Management of Chronic Issues    HPI:  Pt is a 83 y.o. female seen today for medical management of chronic diseases.    Hx of HTN, controlled on Losartan 100mg  daily, last TSH 1.99 01/07/17, while taking Levothyroxine 28mcg, memory is preserved with Aricept 10mg  daily, able to function well in AL setting. Mild low Hgb 9.9 01/07/17, on B12 1030mcg po daily. CKD creat 1.2 01/07/17  Past Medical History:  Diagnosis Date  . Blood transfusion 2006  . Colon polyps   . CVA (cerebral infarction) 2013  . Dementia   .  Diverticulosis of colon   . DJD (degenerative joint disease)   . Hyperlipidemia   . Hypertension   . Hypothyroidism   . Melanoma ([place]) 03/20/15   removed with Moh's surgery  . Myelodysplasia 02/04/2012  . Normochromic normocytic anemia 02/04/2012  . Onychomycosis   . Osteoporosis   . Rectal prolapse   . Shoulder pain   . Unstable gait    Past Surgical History:  Procedure Laterality Date  . APPENDECTOMY    . CARPAL TUNNEL RELEASE Bilateral 2006  . FLEXIBLE SIGMOIDOSCOPY  07/09/2012   Procedure: FLEXIBLE SIGMOIDOSCOPY;  Surgeon: Inda Castle, MD;  Location: [facility];  Service: Endoscopy;  Laterality: N/A;  . MELANOMA EXCISION Right 2005   arm  . POLYPECTOMY  2004   Laparoscopic  . RECTOCELE REPAIR    . REPLACEMENT TOTAL KNEE  2005 & 2006  . VAGINAL HYSTERECTOMY  1989    Allergies  Allergen Reactions  . Lisinopril     REACTION: angioedema  . Penicillins     REACTION: rash    Allergies as of 02/06/2017      Reactions   Lisinopril    REACTION: angioedema   Penicillins    REACTION: rash      Medication List       Accurate as of 02/06/17 12:48 PM. Always use your most recent med list.          acetaminophen 500 MG tablet Commonly known as:  TYLENOL 500 mg one every 8 hours as needed   aspirin  EC 81 MG tablet Take 81 mg by mouth daily.   CALTRATE 600+D PLUS 600-400 MG-UNIT per tablet Take 1 tablet by mouth daily.   cycloSPORINE 0.05 % ophthalmic emulsion Commonly known as:  RESTASIS Place 1 drop into both eyes 2 (two) times daily.   donepezil 10 MG tablet Commonly known as:  ARICEPT Take 10 mg by mouth daily.   levothyroxine 88 MCG tablet Commonly known as:  SYNTHROID, LEVOTHROID TAKE 1 TABLET BY MOUTH ONCE DAILY   losartan 100 MG tablet Commonly known as:  COZAAR TAKE 1 TABLET EVERY DAY FOR BLOOD PRESSURE   multivitamin with minerals Tabs tablet Take 1 tablet by mouth daily.   polyethylene glycol powder powder Commonly known as:   GLYCOLAX/MIRALAX Take 17 g by mouth daily as needed.   vitamin B-12 1000 MCG tablet Commonly known as:  CYANOCOBALAMIN Take 1,000 mcg by mouth daily.   VITAMIN D-3 PO Take by mouth. 1,000mg  one daily       Review of Systems  Constitutional: Negative.   HENT: Negative for congestion, dental problem, hearing loss, rhinorrhea, sinus pressure, sore throat and tinnitus.   Eyes: Negative for pain, discharge and visual disturbance.  Respiratory: Positive for cough. Negative for shortness of breath.   Cardiovascular: Negative for chest pain, palpitations and leg swelling.  Gastrointestinal: Positive for constipation. Negative for abdominal distention, abdominal pain, blood in stool, diarrhea, nausea and vomiting.  Endocrine:       History of hypothyroidism  Genitourinary: Negative for difficulty urinating, dysuria, flank pain, frequency, hematuria, pelvic pain, urgency, vaginal bleeding, vaginal discharge and vaginal pain.  Musculoskeletal: Positive for back pain and gait problem. Negative for arthralgias and joint swelling.       Bilateral shoulder discomfort and limited range of motion.  Skin: Negative for rash.       History of onychomycosis followed by Dr. Mallie Mussel. History melanoma of the right chin and neck. Removed by Moh's surgery for 03/20/15.  Neurological: Negative for dizziness, syncope, speech difficulty, weakness, numbness and headaches.       History of dementia. Currently on Aricept.  Hematological: Negative for adenopathy.       History myelodysplasia with anemia. Followed by Dr. Alvy Bimler.  Psychiatric/Behavioral: Positive for confusion and sleep disturbance (difficulty arousing from sleep in the morning. Hypersomnia.). Negative for agitation, behavioral problems and dysphoric mood. The patient is not nervous/anxious.     Immunization History  Administered Date(s) Administered  . Influenza Split 10/17/2011, 09/14/2012  . Influenza Whole 10/17/2007  . Influenza, High Dose  Seasonal PF 10/11/2013, 10/05/2014  . Influenza-Unspecified 09/05/2015, 10/02/2016  . PPD Test 01/12/2015  . Pneumococcal Conjugate-13 09/29/2014  . Pneumococcal Polysaccharide-23 12/16/1998  . Tdap 04/23/2011  . Zoster 07/04/2006   Pertinent  Health Maintenance Due  Topic Date Due  . OPHTHALMOLOGY EXAM  11/15/2014  . FOOT EXAM  11/30/2014  . HEMOGLOBIN A1C  10/11/2015  . INFLUENZA VACCINE  Completed  . DEXA SCAN  Completed  . PNA vac Low Risk Adult  Completed   Fall Risk  07/04/2016 01/04/2016 04/06/2015 09/29/2014  Falls in the past year? No No No No   Functional Status Survey:    Vitals:   02/06/17 1124  BP: 126/62  Pulse: 68  Resp: 20  Temp: 98 F (36.7 C)  Weight: 156 lb (70.8 kg)  Height: 4\' 11"  (1.499 m)   Body mass index is 31.51 kg/m. Physical Exam  Constitutional: She is oriented to person, place, and time. She appears well-developed and well-nourished.  Blood pressure low normal  HENT:  Head: Normocephalic and atraumatic.  Right Ear: External ear normal.  Left Ear: External ear normal.  Mouth/Throat: Oropharynx is clear and moist.  Eyes: Conjunctivae and EOM are normal.  Neck: Normal range of motion. Neck supple. No JVD present. No thyromegaly present.  Cardiovascular: Normal rate, regular rhythm and intact distal pulses.   Murmur heard. 3/6   Pulmonary/Chest: Effort normal. She has no wheezes. She has rales.  Posterior mid to lower lung rales.   Abdominal: Soft. Bowel sounds are normal. She exhibits no distension and no mass. There is no tenderness. There is no rebound and no guarding.  Lower midline scar  Genitourinary: Vagina normal. Rectal exam shows guaiac negative stool.  Musculoskeletal: Normal range of motion. She exhibits no edema or tenderness.  Bilateral total knee replacement scars. Unstable gait. Decreased range of motion at shoulders.  Neurological: She is alert and oriented to person, place, and time. She has normal reflexes. No cranial  nerve deficit. She exhibits normal muscle tone. Coordination normal.  07/04/16 MMSE 27/30. Failed clock drawing.  Skin: Skin is warm and dry. No rash noted.  Scar right chin and neck. Bilateral onychomycosis of the toes.  Psychiatric: She has a normal mood and affect. Her behavior is normal.  Mild confusion    Labs reviewed:  Recent Labs  08/06/16 1059 10/24/16 01/07/17  NA 142 142 140  K 4.4 4.8 4.9  CO2 23  --   --   GLUCOSE 102  --   --   BUN 35.0* 28* 30*  CREATININE 1.5* 1.1 1.2*  CALCIUM 10.4  --   --     Recent Labs  08/06/16 1059 10/24/16 01/07/17  AST 19 17 15   ALT 24 21 20   ALKPHOS 69 61 59  BILITOT 0.61  --   --   PROT 6.7  --   --   ALBUMIN 3.9  --   --     Recent Labs  08/06/16 1058 10/24/16 01/07/17  WBC 8.2 7.4 6.7  NEUTROABS 5.7  --   --   HGB 10.4* 9.7* 9.9*  HCT 31.1* 29* 30*  MCV 85.7  --   --   PLT 368 365 406*   Lab Results  Component Value Date   TSH 1.99 01/07/2017   Lab Results  Component Value Date   HGBA1C 5.6 04/11/2015   Lab Results  Component Value Date   CHOL 201 (H) 11/30/2013   HDL 53 01/09/2016   LDLCALC 92 01/07/2017   LDLDIRECT 131.2 11/30/2013   TRIG 155.0 (H) 11/30/2013   CHOLHDL 4 11/30/2013    Significant Diagnostic Results in last 30 days:  No results found.  Assessment/Plan Essential hypertension Controlled, continue Losartan 100mg  daily. 01/07/17 wbc 6.7, Hgb 9.9, plt 406, Na 140, K 4.9, Bun 30, creat 1.18, LDL 92, TSH 1.99  Constipation Stable, diet and prn daily Miralax to ensure regular BM  Hypothyroidism continue Levothyroxine 35mcg daily, TSH 1.99 01/07/17  Dementia Seen by neurology at [facility] in November 2013. MRI revealed multi-infarct dementia. Aricept prescribed 04/06/15 MMSE 25/30, failed clock drawing.  Continue Aricept   Chronic kidney disease 01/07/17 creat 1.18  Myelodysplastic syndrome ([facility]) dc'd from oncology, taking Vit B12, last Hgb 9.7 10/24/16, no longer needed any  Aranesp, contact Dr Alvy Bimler if Hgb 9g/dl in the future.      Family/ staff Communication: AL  Labs/tests ordered:  none

## 2017-02-06 NOTE — Assessment & Plan Note (Signed)
dc'd from oncology, taking Vit B12, last Hgb 9.7 10/24/16, no longer needed any Aranesp, contact Dr Alvy Bimler if Hgb 9g/dl in the future.

## 2017-02-06 NOTE — Assessment & Plan Note (Addendum)
Controlled, continue Losartan 100mg  daily. 01/07/17 wbc 6.7, Hgb 9.9, plt 406, Na 140, K 4.9, Bun 30, creat 1.18, LDL 92, TSH 1.99

## 2017-02-06 NOTE — Assessment & Plan Note (Signed)
Seen by neurology at [facility] in November 2013. MRI revealed multi-infarct dementia. Aricept prescribed 04/06/15 MMSE 25/30, failed clock drawing.  Continue Aricept

## 2017-02-06 NOTE — Assessment & Plan Note (Signed)
01/07/17 creat 1.18

## 2017-02-06 NOTE — Assessment & Plan Note (Signed)
Stable, diet and prn daily Miralax to ensure regular BM

## 2017-03-06 ENCOUNTER — Encounter: Payer: Self-pay | Admitting: Nurse Practitioner

## 2017-03-06 ENCOUNTER — Non-Acute Institutional Stay: Payer: Medicare Other | Admitting: Nurse Practitioner

## 2017-03-06 DIAGNOSIS — E039 Hypothyroidism, unspecified: Secondary | ICD-10-CM

## 2017-03-06 DIAGNOSIS — F0150 Vascular dementia without behavioral disturbance: Secondary | ICD-10-CM | POA: Diagnosis not present

## 2017-03-06 DIAGNOSIS — K5900 Constipation, unspecified: Secondary | ICD-10-CM | POA: Diagnosis not present

## 2017-03-06 DIAGNOSIS — N182 Chronic kidney disease, stage 2 (mild): Secondary | ICD-10-CM

## 2017-03-06 DIAGNOSIS — D469 Myelodysplastic syndrome, unspecified: Secondary | ICD-10-CM

## 2017-03-06 DIAGNOSIS — M5440 Lumbago with sciatica, unspecified side: Secondary | ICD-10-CM | POA: Diagnosis not present

## 2017-03-06 DIAGNOSIS — I10 Essential (primary) hypertension: Secondary | ICD-10-CM

## 2017-03-06 NOTE — Assessment & Plan Note (Signed)
On and off-prn Tylenol is adequate for her back pain. Ambulates with walker independently.

## 2017-03-06 NOTE — Assessment & Plan Note (Signed)
01/07/17 Hgb 9.9

## 2017-03-06 NOTE — Assessment & Plan Note (Signed)
Controlled, continue Losartan 100mg  daily. 01/07/17 wbc 6.7, Hgb 9.9, plt 406, Na 140, K 4.9, Bun 30, creat 1.18, LDL 92, TSH 1.99

## 2017-03-06 NOTE — Assessment & Plan Note (Signed)
01/07/17 creat 1.18

## 2017-03-06 NOTE — Assessment & Plan Note (Signed)
continue Levothyroxine 80mcg daily, TSH 1.99 01/07/17

## 2017-03-06 NOTE — Progress Notes (Signed)
Location:  [facility] Room Number: 802 Place of Service:  ALF [phone]) Provider:  Rhydian Baldi, Manxie  NP  Jeanmarie Hubert, MD  Patient Care Team: Estill Dooms, MD as PCP - General (Internal Medicine) Milus Banister, MD as Consulting Physician (Gastroenterology) Heath Lark, MD as Consulting Physician (Hematology and Oncology)  Extended Emergency Contact Information Primary Emergency Contact: [facility] Address: [address]          [place], [place] [address] [name] of [place] Phone: [phone] Work Phone: [phone] Mobile Phone: [phone] Relation: Son Secondary Emergency Contact: [name],[name] Address: [place] [address] [name] of [place] Phone: [phone] Relation: Daughter  Code Status:  DNR Goals of care: Advanced Directive information Advanced Directives 03/06/2017  Does Patient Have a Medical Advance Directive? Yes  Type of Advance Directive Out of facility DNR (pink MOST or yellow form)  Does patient want to make changes to medical advance directive? No - Patient declined  Copy of [place] in Chart? -  Would patient like information on creating a medical advance directive? -  Pre-existing out of facility DNR order (yellow form or pink MOST form) Yellow form placed in chart (order not valid for inpatient use)     Chief Complaint  Patient presents with  . Medical Management of Chronic Issues    HPI:  Pt is a 83 y.o. female seen today for medical management of chronic diseases.    Hx of HTN, controlled on Losartan 100mg  daily, last TSH 1.99 01/07/17, while taking Levothyroxine 12mcg, memory is preserved with Aricept 10mg  daily, able to function well in AL setting. Mild low Hgb 9.9 01/07/17, on B12 1010mcg po daily. CKD creat 1.2 01/07/17    Past Medical History:  Diagnosis Date  . Blood transfusion 2006  . Colon polyps   . CVA (cerebral infarction) 2013  . Dementia     . Diverticulosis of colon   . DJD (degenerative joint disease)   . Hyperlipidemia   . Hypertension   . Hypothyroidism   . Melanoma ([place]) 03/20/15   removed with Moh's surgery  . Myelodysplasia 02/04/2012  . Normochromic normocytic anemia 02/04/2012  . Onychomycosis   . Osteoporosis   . Rectal prolapse   . Shoulder pain   . Unstable gait    Past Surgical History:  Procedure Laterality Date  . APPENDECTOMY    . CARPAL TUNNEL RELEASE Bilateral 2006  . FLEXIBLE SIGMOIDOSCOPY  07/09/2012   Procedure: FLEXIBLE SIGMOIDOSCOPY;  Surgeon: Inda Castle, MD;  Location: [facility];  Service: Endoscopy;  Laterality: N/A;  . MELANOMA EXCISION Right 2005   arm  . POLYPECTOMY  2004   Laparoscopic  . RECTOCELE REPAIR    . REPLACEMENT TOTAL KNEE  2005 & 2006  . VAGINAL HYSTERECTOMY  1989    Allergies  Allergen Reactions  . Lisinopril     REACTION: angioedema  . Penicillins     REACTION: rash    Allergies as of 03/06/2017      Reactions   Lisinopril    REACTION: angioedema   Penicillins    REACTION: rash      Medication List       Accurate as of 03/06/17 12:09 PM. Always use your most recent med list.          acetaminophen 500 MG tablet Commonly known as:  TYLENOL 500 mg one every 8 hours as needed  aspirin EC 81 MG tablet Take 81 mg by mouth daily.   CALTRATE 600+D PLUS 600-400 MG-UNIT per tablet Take 1 tablet by mouth daily.   cycloSPORINE 0.05 % ophthalmic emulsion Commonly known as:  RESTASIS Place 1 drop into both eyes 2 (two) times daily.   donepezil 10 MG tablet Commonly known as:  ARICEPT Take 10 mg by mouth daily.   levothyroxine 88 MCG tablet Commonly known as:  SYNTHROID, LEVOTHROID TAKE 1 TABLET BY MOUTH ONCE DAILY   losartan 100 MG tablet Commonly known as:  COZAAR TAKE 1 TABLET EVERY DAY FOR BLOOD PRESSURE   multivitamin with minerals Tabs tablet Take 1 tablet by mouth daily.   polyethylene glycol powder powder Commonly known as:   GLYCOLAX/MIRALAX Take 17 g by mouth daily as needed.   vitamin B-12 1000 MCG tablet Commonly known as:  CYANOCOBALAMIN Take 1,000 mcg by mouth daily.   VITAMIN D-3 PO Take by mouth. 1,000mg  one daily       Review of Systems  Constitutional: Negative.   HENT: Negative for congestion, dental problem, hearing loss, rhinorrhea, sinus pressure, sore throat and tinnitus.   Eyes: Negative for pain, discharge and visual disturbance.  Respiratory: Positive for cough. Negative for shortness of breath.   Cardiovascular: Negative for chest pain, palpitations and leg swelling.  Gastrointestinal: Positive for constipation. Negative for abdominal distention, abdominal pain, blood in stool, diarrhea, nausea and vomiting.  Endocrine:       History of hypothyroidism  Genitourinary: Negative for difficulty urinating, dysuria, flank pain, frequency, hematuria, pelvic pain, urgency, vaginal bleeding, vaginal discharge and vaginal pain.  Musculoskeletal: Positive for back pain and gait problem. Negative for arthralgias and joint swelling.       Bilateral shoulder discomfort and limited range of motion.  Skin: Negative for rash.       History of onychomycosis followed by Dr. Mallie Mussel. History melanoma of the right chin and neck. Removed by Moh's surgery for 03/20/15.  Neurological: Negative for dizziness, syncope, speech difficulty, weakness, numbness and headaches.       History of dementia. Currently on Aricept.  Hematological: Negative for adenopathy.       History myelodysplasia with anemia. Followed by Dr. Alvy Bimler.  Psychiatric/Behavioral: Positive for confusion and sleep disturbance (difficulty arousing from sleep in the morning. Hypersomnia.). Negative for agitation, behavioral problems and dysphoric mood. The patient is not nervous/anxious.     Immunization History  Administered Date(s) Administered  . Influenza Split 10/17/2011, 09/14/2012  . Influenza Whole 10/17/2007  . Influenza, High Dose  Seasonal PF 10/11/2013, 10/05/2014  . Influenza-Unspecified 09/05/2015, 10/02/2016  . PPD Test 01/12/2015  . Pneumococcal Conjugate-13 09/29/2014  . Pneumococcal Polysaccharide-23 12/16/1998  . Tdap 04/23/2011  . Zoster 07/04/2006   Pertinent  Health Maintenance Due  Topic Date Due  . OPHTHALMOLOGY EXAM  11/15/2014  . FOOT EXAM  11/30/2014  . HEMOGLOBIN A1C  10/11/2015  . INFLUENZA VACCINE  Completed  . DEXA SCAN  Completed  . PNA vac Low Risk Adult  Completed   Fall Risk  07/04/2016 01/04/2016 04/06/2015 09/29/2014  Falls in the past year? No No No No   Functional Status Survey:    Vitals:   03/06/17 1106  BP: 110/70  Pulse: 60  Resp: 20  Temp: 98.7 F (37.1 C)  Weight: 156 lb 8 oz (71 kg)  Height: 4\' 11"  (1.499 m)   Body mass index is 31.61 kg/m. Physical Exam  Constitutional: She is oriented to person, place, and time. She appears  well-developed and well-nourished.  Blood pressure low normal  HENT:  Head: Normocephalic and atraumatic.  Right Ear: External ear normal.  Left Ear: External ear normal.  Mouth/Throat: Oropharynx is clear and moist.  Eyes: Conjunctivae and EOM are normal.  Neck: Normal range of motion. Neck supple. No JVD present. No thyromegaly present.  Cardiovascular: Normal rate, regular rhythm and intact distal pulses.   Murmur heard. 3/6   Pulmonary/Chest: Effort normal. She has no wheezes. She has rales.  Posterior mid to lower lung rales.   Abdominal: Soft. Bowel sounds are normal. She exhibits no distension and no mass. There is no tenderness. There is no rebound and no guarding.  Lower midline scar  Genitourinary: Vagina normal. Rectal exam shows guaiac negative stool.  Musculoskeletal: Normal range of motion. She exhibits no edema or tenderness.  Bilateral total knee replacement scars. Unstable gait. Decreased range of motion at shoulders.  Neurological: She is alert and oriented to person, place, and time. She has normal reflexes. No  cranial nerve deficit. She exhibits normal muscle tone. Coordination normal.  07/04/16 MMSE 27/30. Failed clock drawing.  Skin: Skin is warm and dry. No rash noted.  Scar right chin and neck. Bilateral onychomycosis of the toes.  Psychiatric: She has a normal mood and affect. Her behavior is normal.  Mild confusion    Labs reviewed:  Recent Labs  08/06/16 1059 10/24/16 01/07/17  NA 142 142 140  K 4.4 4.8 4.9  CO2 23  --   --   GLUCOSE 102  --   --   BUN 35.0* 28* 30*  CREATININE 1.5* 1.1 1.2*  CALCIUM 10.4  --   --     Recent Labs  08/06/16 1059 10/24/16 01/07/17  AST 19 17 15   ALT 24 21 20   ALKPHOS 69 61 59  BILITOT 0.61  --   --   PROT 6.7  --   --   ALBUMIN 3.9  --   --     Recent Labs  08/06/16 1058 10/24/16 01/07/17  WBC 8.2 7.4 6.7  NEUTROABS 5.7  --   --   HGB 10.4* 9.7* 9.9*  HCT 31.1* 29* 30*  MCV 85.7  --   --   PLT 368 365 406*   Lab Results  Component Value Date   TSH 1.99 01/07/2017   Lab Results  Component Value Date   HGBA1C 5.6 04/11/2015   Lab Results  Component Value Date   CHOL 201 (H) 11/30/2013   HDL 53 01/09/2016   LDLCALC 92 01/07/2017   LDLDIRECT 131.2 11/30/2013   TRIG 155.0 (H) 11/30/2013   CHOLHDL 4 11/30/2013    Significant Diagnostic Results in last 30 days:  No results found.  Assessment/Plan Essential hypertension Controlled, continue Losartan 100mg  daily. 01/07/17 wbc 6.7, Hgb 9.9, plt 406, Na 140, K 4.9, Bun 30, creat 1.18, LDL 92, TSH 1.99  Constipation Stable, diet and prn daily Miralax to ensure regular BM  Hypothyroidism continue Levothyroxine 61mcg daily, TSH 1.99 01/07/17  Chronic kidney disease 01/07/17 creat 1.18  Myelodysplastic syndrome ([place]) 01/07/17 Hgb 9.9  Back pain On and off-prn Tylenol is adequate for her back pain. Ambulates with walker independently.       Family/ staff Communication: AL  Labs/tests ordered:  none

## 2017-03-06 NOTE — Assessment & Plan Note (Signed)
Stable, diet and prn daily Miralax to ensure regular BM

## 2017-04-14 ENCOUNTER — Non-Acute Institutional Stay: Payer: Medicare Other | Admitting: Internal Medicine

## 2017-04-14 ENCOUNTER — Encounter: Payer: Self-pay | Admitting: Internal Medicine

## 2017-04-14 DIAGNOSIS — E039 Hypothyroidism, unspecified: Secondary | ICD-10-CM | POA: Diagnosis not present

## 2017-04-14 DIAGNOSIS — N182 Chronic kidney disease, stage 2 (mild): Secondary | ICD-10-CM

## 2017-04-14 DIAGNOSIS — I10 Essential (primary) hypertension: Secondary | ICD-10-CM

## 2017-04-14 DIAGNOSIS — F0150 Vascular dementia without behavioral disturbance: Secondary | ICD-10-CM | POA: Diagnosis not present

## 2017-04-14 DIAGNOSIS — E785 Hyperlipidemia, unspecified: Secondary | ICD-10-CM | POA: Diagnosis not present

## 2017-04-14 DIAGNOSIS — D469 Myelodysplastic syndrome, unspecified: Secondary | ICD-10-CM

## 2017-04-14 NOTE — Progress Notes (Signed)
Progress Note    Location:  [facility] Room Number: [place] of Service:  ALF [phone]) Provider: Jeanmarie Hubert, MD  Patient Care Team: Estill Dooms, MD as PCP - General (Internal Medicine) Milus Banister, MD as Consulting Physician (Gastroenterology) Heath Lark, MD as Consulting Physician (Hematology and Oncology)  Extended Emergency Contact Information Primary Emergency Contact: [facility] Address: [address]          [place], [place] [address] [name] of [place] Phone: [phone] Work Phone: [phone] Mobile Phone: [phone] Relation: Son Secondary Emergency Contact: [name],[name] Address: [place] [address] [name] of [place] Phone: [phone] Relation: Daughter  Code Status:  DNR Goals of care: Advanced Directive information Advanced Directives 04/14/2017  Does Patient Have a Medical Advance Directive? Yes  Type of Advance Directive Out of facility DNR (pink MOST or yellow form)  Does patient want to make changes to medical advance directive? -  Copy of [place] in Chart? -  Would patient like information on creating a medical advance directive? -  Pre-existing out of facility DNR order (yellow form or pink MOST form) Yellow form placed in chart (order not valid for inpatient use)     Chief Complaint  Patient presents with  . Medical Management of Chronic Issues    routine visit    HPI:  Pt is a 83 y.o. female seen today for medical management of chronic diseases.    Vascular dementia without behavioral disturbance - unchanged. Remains on donepezil.  Essential hypertension - controlled  Hyperlipidemia, unspecified hyperlipidemia type - controlled  Hypothyroidism, unspecified type - compensated  Myelodysplastic syndrome ([place]) - last CBC was Jan 2018.     Past Medical History:  Diagnosis Date  . Blood transfusion 2006  . Colon polyps   . CVA  (cerebral infarction) 2013  . Dementia   . Diverticulosis of colon   . DJD (degenerative joint disease)   . Hyperlipidemia   . Hypertension   . Hypothyroidism   . Melanoma ([place]) 03/20/15   removed with Moh's surgery  . Myelodysplasia 02/04/2012  . Normochromic normocytic anemia 02/04/2012  . Onychomycosis   . Osteoporosis   . Rectal prolapse   . Shoulder pain   . Unstable gait    Past Surgical History:  Procedure Laterality Date  . APPENDECTOMY    . CARPAL TUNNEL RELEASE Bilateral 2006  . FLEXIBLE SIGMOIDOSCOPY  07/09/2012   Procedure: FLEXIBLE SIGMOIDOSCOPY;  Surgeon: Inda Castle, MD;  Location: [facility];  Service: Endoscopy;  Laterality: N/A;  . MELANOMA EXCISION Right 2005   arm  . POLYPECTOMY  2004   Laparoscopic  . RECTOCELE REPAIR    . REPLACEMENT TOTAL KNEE  2005 & 2006  . VAGINAL HYSTERECTOMY  1989    Allergies  Allergen Reactions  . Lisinopril     REACTION: angioedema  . Penicillins     REACTION: rash    Outpatient Encounter Prescriptions as of 04/14/2017  Medication Sig  . acetaminophen (TYLENOL) 500 MG tablet 500 mg one every 8 hours as needed  . aspirin EC 81 MG tablet Take 81 mg by mouth daily.  . Calcium Carbonate-Vit D-Min (CALTRATE 600+D PLUS) 600-400 MG-UNIT per tablet Take 1 tablet by mouth daily.    . Cholecalciferol (VITAMIN D-3 PO) Take by mouth. 1,000mg  one daily  . cycloSPORINE (RESTASIS) 0.05 % ophthalmic emulsion Place 1 drop into both  eyes 2 (two) times daily.    Marland Kitchen donepezil (ARICEPT) 10 MG tablet Take 10 mg by mouth daily.  Marland Kitchen levothyroxine (SYNTHROID, LEVOTHROID) 88 MCG tablet TAKE 1 TABLET BY MOUTH ONCE DAILY  . losartan (COZAAR) 100 MG tablet TAKE 1 TABLET EVERY DAY FOR BLOOD PRESSURE  . Multiple Vitamin (MULTIVITAMIN WITH MINERALS) TABS Take 1 tablet by mouth daily.  . polyethylene glycol powder (GLYCOLAX/MIRALAX) powder Take 17 g by mouth daily as needed.  . vitamin B-12 (CYANOCOBALAMIN) 1000 MCG tablet Take 1,000 mcg by mouth  daily.   No facility-administered encounter medications on file as of 04/14/2017.     Review of Systems  Constitutional: Negative.   HENT: Negative for congestion, dental problem, hearing loss, rhinorrhea, sinus pressure, sore throat and tinnitus.   Eyes: Negative for pain, discharge and visual disturbance.  Respiratory: Positive for cough. Negative for shortness of breath.   Cardiovascular: Negative for chest pain, palpitations and leg swelling.  Gastrointestinal: Positive for constipation. Negative for abdominal distention, abdominal pain, blood in stool, diarrhea, nausea and vomiting.  Endocrine:       History of hypothyroidism  Genitourinary: Negative for difficulty urinating, dysuria, flank pain, frequency, hematuria, pelvic pain, urgency, vaginal bleeding, vaginal discharge and vaginal pain.  Musculoskeletal: Positive for back pain and gait problem. Negative for arthralgias and joint swelling.       Bilateral shoulder discomfort and limited range of motion.  Skin: Negative for rash.       History of onychomycosis followed by Dr. Mallie Mussel. History melanoma of the right chin and neck. Removed by Moh's surgery for 03/20/15.  Neurological: Negative for dizziness, syncope, speech difficulty, weakness, numbness and headaches.       History of dementia. Currently on Aricept.  Hematological: Negative for adenopathy.       History myelodysplasia with anemia. Followed by Dr. Alvy Bimler.  Psychiatric/Behavioral: Positive for confusion and sleep disturbance (difficulty arousing from sleep in the morning. Hypersomnia.). Negative for agitation, behavioral problems and dysphoric mood. The patient is not nervous/anxious.     Immunization History  Administered Date(s) Administered  . Influenza Split 10/17/2011, 09/14/2012  . Influenza Whole 10/17/2007  . Influenza, High Dose Seasonal PF 10/11/2013, 10/05/2014  . Influenza-Unspecified 09/05/2015, 10/02/2016  . PPD Test 01/12/2015  . Pneumococcal  Conjugate-13 09/29/2014  . Pneumococcal Polysaccharide-23 12/16/1998  . Tdap 04/23/2011  . Zoster 07/04/2006   Pertinent  Health Maintenance Due  Topic Date Due  . OPHTHALMOLOGY EXAM  11/15/2014  . FOOT EXAM  11/30/2014  . HEMOGLOBIN A1C  10/11/2015  . INFLUENZA VACCINE  07/16/2017  . DEXA SCAN  Completed  . PNA vac Low Risk Adult  Completed   Fall Risk  07/04/2016 01/04/2016 04/06/2015 09/29/2014  Falls in the past year? No No No No   Functional Status Survey:    Vitals:   04/14/17 1350  BP: 130/68  Pulse: 60  Resp: 17  Temp: 98.6 F (37 C)  Weight: 156 lb (70.8 kg)  Height: 4\' 11"  (1.499 m)   Body mass index is 31.51 kg/m. Physical Exam  Constitutional: She is oriented to person, place, and time. She appears well-developed and well-nourished.  Blood pressure low normal  HENT:  Head: Normocephalic and atraumatic.  Right Ear: External ear normal.  Left Ear: External ear normal.  Mouth/Throat: Oropharynx is clear and moist.  Eyes: Conjunctivae and EOM are normal.  Neck: Normal range of motion. Neck supple. No JVD present. No thyromegaly present.  Cardiovascular: Normal rate, regular rhythm and intact distal  pulses.   Murmur heard. 3/6   Pulmonary/Chest: Effort normal. She has no wheezes. She has rales.  Posterior mid to lower lung rales.   Abdominal: Soft. Bowel sounds are normal. She exhibits no distension and no mass. There is no tenderness. There is no rebound and no guarding.  Lower midline scar  Genitourinary: Vagina normal. Rectal exam shows guaiac negative stool.  Musculoskeletal: Normal range of motion. She exhibits no edema or tenderness.  Bilateral total knee replacement scars. Unstable gait. Decreased range of motion at shoulders.  Neurological: She is alert and oriented to person, place, and time. She has normal reflexes. No cranial nerve deficit. She exhibits normal muscle tone. Coordination normal.  07/04/16 MMSE 27/30. Failed clock drawing.  Skin:  Skin is warm and dry. No rash noted.  Scar right chin and neck. Bilateral onychomycosis of the toes.  Psychiatric: She has a normal mood and affect. Her behavior is normal.  Mild confusion    Labs reviewed:  Recent Labs  08/06/16 1059 10/24/16 01/07/17  NA 142 142 140  K 4.4 4.8 4.9  CO2 23  --   --   GLUCOSE 102  --   --   BUN 35.0* 28* 30*  CREATININE 1.5* 1.1 1.2*  CALCIUM 10.4  --   --     Recent Labs  08/06/16 1059 10/24/16 01/07/17  AST 19 17 15   ALT 24 21 20   ALKPHOS 69 61 59  BILITOT 0.61  --   --   PROT 6.7  --   --   ALBUMIN 3.9  --   --     Recent Labs  08/06/16 1058 10/24/16 01/07/17  WBC 8.2 7.4 6.7  NEUTROABS 5.7  --   --   HGB 10.4* 9.7* 9.9*  HCT 31.1* 29* 30*  MCV 85.7  --   --   PLT 368 365 406*   Lab Results  Component Value Date   TSH 1.99 01/07/2017   Lab Results  Component Value Date   HGBA1C 5.6 04/11/2015   Lab Results  Component Value Date   CHOL 201 (H) 11/30/2013   HDL 53 01/09/2016   LDLCALC 92 01/07/2017   LDLDIRECT 131.2 11/30/2013   TRIG 155.0 (H) 11/30/2013   CHOLHDL 4 11/30/2013     Assessment/Plan 1. Vascular dementia without behavioral disturbance The current medical regimen is effective;  continue present plan and medications.  2. Essential hypertension The current medical regimen is effective;  continue present plan and medications. -CMP  3. Hyperlipidemia, unspecified hyperlipidemia type -lipides  4. Hypothyroidism, unspecified type -TSH  5. Myelodysplastic syndrome ([facility]) -CBC

## 2017-04-14 NOTE — Progress Notes (Signed)
Opened in error

## 2017-04-17 NOTE — Addendum Note (Signed)
Addended by: Estill Dooms on: 04/17/2017 12:15 PM   Modules accepted: Level of Service

## 2017-04-22 LAB — BASIC METABOLIC PANEL
BUN: 31 mg/dL — AB (ref 4–21)
CREATININE: 1.2 mg/dL — AB (ref <=1.1)
GLUCOSE: 79 mg/dL
Potassium: 5.1 mmol/L (ref 3.4–5.3)
Sodium: 141 mmol/L (ref 137–147)

## 2017-04-22 LAB — LIPID PANEL
Cholesterol: 147 mg/dL (ref 0–200)
HDL: 47 mg/dL (ref 35–70)
LDL CALC: 77 mg/dL
LDl/HDL Ratio: 3.1
Triglycerides: 125 mg/dL (ref 40–160)

## 2017-04-22 LAB — CBC AND DIFFERENTIAL
HEMATOCRIT: 27 % — AB (ref 36–46)
Hemoglobin: 9.4 g/dL — AB (ref 12.0–16.0)
Platelets: 373 10*3/uL (ref 150–399)
WBC: 7.2 10*3/mL

## 2017-04-22 LAB — TSH: TSH: 1.23 u[IU]/mL (ref <=5.90)

## 2017-04-23 ENCOUNTER — Other Ambulatory Visit: Payer: Self-pay | Admitting: *Deleted

## 2017-05-15 ENCOUNTER — Non-Acute Institutional Stay: Payer: Medicare Other | Admitting: Nurse Practitioner

## 2017-05-15 DIAGNOSIS — I10 Essential (primary) hypertension: Secondary | ICD-10-CM

## 2017-05-15 DIAGNOSIS — D469 Myelodysplastic syndrome, unspecified: Secondary | ICD-10-CM | POA: Diagnosis not present

## 2017-05-15 DIAGNOSIS — E039 Hypothyroidism, unspecified: Secondary | ICD-10-CM | POA: Diagnosis not present

## 2017-05-15 DIAGNOSIS — K5900 Constipation, unspecified: Secondary | ICD-10-CM | POA: Diagnosis not present

## 2017-05-15 DIAGNOSIS — F0150 Vascular dementia without behavioral disturbance: Secondary | ICD-10-CM | POA: Diagnosis not present

## 2017-05-15 DIAGNOSIS — N182 Chronic kidney disease, stage 2 (mild): Secondary | ICD-10-CM

## 2017-05-15 NOTE — Progress Notes (Signed)
Location:  [facility] Room Number: [address] of Service:  ALF [phone]) Provider:  Mast, Manxie  NP  Estill Dooms, MD  Patient Care Team: Estill Dooms, MD as PCP - General (Internal Medicine) Milus Banister, MD as Consulting Physician (Gastroenterology) Heath Lark, MD as Consulting Physician (Hematology and Oncology)  Extended Emergency Contact Information Primary Emergency Contact: [facility] Address: [address]          [name], [place] [address] [name] of [place] Phone: [phone] Work Phone: [phone] Mobile Phone: [phone] Relation: Son Secondary Emergency Contact: [name],[name] Address: [place] [address] [name] of [place] Phone: [phone] Relation: Daughter  Code Status:  DNR Goals of care: Advanced Directive information Advanced Directives 05/15/2017  Does Patient Have a Medical Advance Directive? Yes  Type of Advance Directive Out of facility DNR (pink MOST or yellow form)  Does patient want to make changes to medical advance directive? No - Patient declined  Copy of [place] in Chart? -  Would patient like information on creating a medical advance directive? -  Pre-existing out of facility DNR order (yellow form or pink MOST form) Yellow form placed in chart (order not valid for inpatient use)     Chief Complaint  Patient presents with  . Medical Management of Chronic Issues    HPI:  Pt is a 83 y.o. female seen today for medical management of chronic diseases.     Past Medical History:  Diagnosis Date  . Blood transfusion 2006  . Colon polyps   . CVA (cerebral infarction) 2013  . Dementia   . Diverticulosis of colon   . DJD (degenerative joint disease)   . Hyperlipidemia   . Hypertension   . Hypothyroidism   . Melanoma ([place]) 03/20/15   removed with Moh's surgery  . Myelodysplasia 02/04/2012  . Normochromic normocytic anemia 02/04/2012    . Onychomycosis   . Osteoporosis   . Rectal prolapse   . Shoulder pain   . Unstable gait    Past Surgical History:  Procedure Laterality Date  . APPENDECTOMY    . CARPAL TUNNEL RELEASE Bilateral 2006  . FLEXIBLE SIGMOIDOSCOPY  07/09/2012   Procedure: FLEXIBLE SIGMOIDOSCOPY;  Surgeon: Inda Castle, MD;  Location: [facility];  Service: Endoscopy;  Laterality: N/A;  . MELANOMA EXCISION Right 2005   arm  . POLYPECTOMY  2004   Laparoscopic  . RECTOCELE REPAIR    . REPLACEMENT TOTAL KNEE  2005 & 2006  . VAGINAL HYSTERECTOMY  1989    Allergies  Allergen Reactions  . Lisinopril     REACTION: angioedema  . Penicillins     REACTION: rash    Outpatient Encounter Prescriptions as of 05/15/2017  Medication Sig  . acetaminophen (TYLENOL) 500 MG tablet 500 mg one every 8 hours as needed  . aspirin EC 81 MG tablet Take 81 mg by mouth daily.  . Calcium Carbonate-Vit D-Min (CALTRATE 600+D PLUS) 600-400 MG-UNIT per tablet Take 1 tablet by mouth daily.    . Cholecalciferol (VITAMIN D-3 PO) Take by mouth. 1,000mg  one daily  . cycloSPORINE (RESTASIS) 0.05 % ophthalmic emulsion Place 1 drop into both eyes 2 (two) times daily.    Marland Kitchen donepezil (ARICEPT) 10 MG tablet Take 10 mg by mouth daily.  Marland Kitchen levothyroxine (SYNTHROID, LEVOTHROID) 88 MCG tablet TAKE 1 TABLET BY MOUTH ONCE DAILY  . losartan (COZAAR) 100 MG  tablet TAKE 1 TABLET EVERY DAY FOR BLOOD PRESSURE  . Multiple Vitamin (MULTIVITAMIN WITH MINERALS) TABS Take 1 tablet by mouth daily.  . polyethylene glycol powder (GLYCOLAX/MIRALAX) powder Take 17 g by mouth daily as needed.  . vitamin B-12 (CYANOCOBALAMIN) 1000 MCG tablet Take 1,000 mcg by mouth daily.   No facility-administered encounter medications on file as of 05/15/2017.     Review of Systems  Immunization History  Administered Date(s) Administered  . Influenza Split 10/17/2011, 09/14/2012  . Influenza Whole 10/17/2007  . Influenza, High Dose Seasonal PF 10/11/2013, 10/05/2014   . Influenza-Unspecified 09/05/2015, 10/02/2016  . PPD Test 01/12/2015  . Pneumococcal Conjugate-13 09/29/2014  . Pneumococcal Polysaccharide-23 12/16/1998  . Tdap 04/23/2011  . Zoster 07/04/2006   Pertinent  Health Maintenance Due  Topic Date Due  . OPHTHALMOLOGY EXAM  11/15/2014  . FOOT EXAM  11/30/2014  . HEMOGLOBIN A1C  10/11/2015  . INFLUENZA VACCINE  07/16/2017  . DEXA SCAN  Completed  . PNA vac Low Risk Adult  Completed   Fall Risk  07/04/2016 01/04/2016 04/06/2015 09/29/2014  Falls in the past year? No No No No   Functional Status Survey:    Vitals:   05/15/17 1101  BP: 119/60  Pulse: 64  Resp: 18  Temp: 99 F (37.2 C)  Weight: 156 lb 8 oz (71 kg)  Height: 4\' 11"  (1.499 m)   Body mass index is 31.61 kg/m. Physical Exam  Labs reviewed:  Recent Labs  08/06/16 1059 10/24/16 01/07/17 04/22/17  NA 142 142 140 141  K 4.4 4.8 4.9 5.1  CO2 23  --   --   --   GLUCOSE 102  --   --   --   BUN 35.0* 28* 30* 31*  CREATININE 1.5* 1.1 1.2* 1.2*  CALCIUM 10.4  --   --   --     Recent Labs  08/06/16 1059 10/24/16 01/07/17  AST 19 17 15   ALT 24 21 20   ALKPHOS 69 61 59  BILITOT 0.61  --   --   PROT 6.7  --   --   ALBUMIN 3.9  --   --     Recent Labs  08/06/16 1058 10/24/16 01/07/17 04/22/17  WBC 8.2 7.4 6.7 7.2  NEUTROABS 5.7  --   --   --   HGB 10.4* 9.7* 9.9* 9.4*  HCT 31.1* 29* 30* 27*  MCV 85.7  --   --   --   PLT 368 365 406* 373   Lab Results  Component Value Date   TSH 1.23 04/22/2017   Lab Results  Component Value Date   HGBA1C 5.6 04/11/2015   Lab Results  Component Value Date   CHOL 147 04/22/2017   HDL 47 04/22/2017   LDLCALC 77 04/22/2017   LDLDIRECT 131.2 11/30/2013   TRIG 125 04/22/2017   CHOLHDL 4 11/30/2013    Significant Diagnostic Results in last 30 days:  No results found.  Assessment/Plan 1. Essential hypertension   2. Constipation, unspecified constipation type   3. Hypothyroidism, unspecified type   4.  Vascular dementia without behavioral disturbance   5. CKD (chronic kidney disease) stage 2, GFR 60-89 ml/min   6. Myelodysplastic syndrome ([place])     Family/ staff Communication:    Labs/tests ordered:

## 2017-05-15 NOTE — Assessment & Plan Note (Signed)
04/22/17 Hgb 9.4 Continue Vit B12

## 2017-05-15 NOTE — Assessment & Plan Note (Signed)
continue Levothyroxine 26mcg daily, TSH 1.23 04/22/17

## 2017-05-15 NOTE — Assessment & Plan Note (Addendum)
Controlled, continue Losartan 100mg  daily, 04/22/17 TSH 1.23, LDL 77, Na 141, K 5.1, Bun 31, creat 1.21, wbc 7.2, Hgb 9.4, plt 373

## 2017-05-15 NOTE — Progress Notes (Signed)
Location:  [place] Room Number: 295-M Place of Service:  ALF [phone]) Provider:  Hemi Chacko, Manxie  NP  Estill Dooms, MD  Patient Care Team: Estill Dooms, MD as PCP - General (Internal Medicine) Milus Banister, MD as Consulting Physician (Gastroenterology) Heath Lark, MD as Consulting Physician (Hematology and Oncology)  Extended Emergency Contact Information Primary Emergency Contact: [facility] Address: [address]          [place], [place] [address] [name] of [place] Phone: [phone] Work Phone: [phone] Mobile Phone: [phone] Relation: Son Secondary Emergency Contact: [name],[name] Address: [place] [address] [name] of [place] Phone: [phone] Relation: Daughter  Code Status:  DNR Goals of care: Advanced Directive information Advanced Directives 05/15/2017  Does Patient Have a Medical Advance Directive? Yes  Type of Advance Directive Out of facility DNR (pink MOST or yellow form)  Does patient want to make changes to medical advance directive? No - Patient declined  Copy of [place] in Chart? -  Would patient like information on creating a medical advance directive? -  Pre-existing out of facility DNR order (yellow form or pink MOST form) Yellow form placed in chart (order not valid for inpatient use)     Chief Complaint  Patient presents with  . Medical Management of Chronic Issues    HPI:  Pt is a 83 y.o. female seen today for medical management of chronic diseases.    Hx of HTN, controlled on Losartan 100mg  daily, last TSH 1.23 04/22/17,  while taking Levothyroxine 68mcg, memory is preserved with Aricept 10mg  daily, able to function well in AL setting. Mild low Hgb 9.4 04/22/17, on B12 1072mcg po daily. CKD creat 1.2 04/22/17    Past Medical History:  Diagnosis Date  . Blood transfusion 2006  . Colon polyps   . CVA (cerebral infarction) 2013  .  Dementia   . Diverticulosis of colon   . DJD (degenerative joint disease)   . Hyperlipidemia   . Hypertension   . Hypothyroidism   . Melanoma ([place]) 03/20/15   removed with Moh's surgery  . Myelodysplasia 02/04/2012  . Normochromic normocytic anemia 02/04/2012  . Onychomycosis   . Osteoporosis   . Rectal prolapse   . Shoulder pain   . Unstable gait    Past Surgical History:  Procedure Laterality Date  . APPENDECTOMY    . CARPAL TUNNEL RELEASE Bilateral 2006  . FLEXIBLE SIGMOIDOSCOPY  07/09/2012   Procedure: FLEXIBLE SIGMOIDOSCOPY;  Surgeon: Inda Castle, MD;  Location: [facility];  Service: Endoscopy;  Laterality: N/A;  . MELANOMA EXCISION Right 2005   arm  . POLYPECTOMY  2004   Laparoscopic  . RECTOCELE REPAIR    . REPLACEMENT TOTAL KNEE  2005 & 2006  . VAGINAL HYSTERECTOMY  1989    Allergies  Allergen Reactions  . Lisinopril     REACTION: angioedema  . Penicillins     REACTION: rash    Allergies as of 05/15/2017      Reactions   Lisinopril    REACTION: angioedema   Penicillins    REACTION: rash      Medication List       Accurate as of 05/15/17 11:05 AM. Always use your most recent med list.          acetaminophen 500 MG tablet Commonly known as:  TYLENOL 500 mg one every 8 hours as  needed   aspirin EC 81 MG tablet Take 81 mg by mouth daily.   CALTRATE 600+D PLUS 600-400 MG-UNIT per tablet Take 1 tablet by mouth daily.   cycloSPORINE 0.05 % ophthalmic emulsion Commonly known as:  RESTASIS Place 1 drop into both eyes 2 (two) times daily.   donepezil 10 MG tablet Commonly known as:  ARICEPT Take 10 mg by mouth daily.   levothyroxine 88 MCG tablet Commonly known as:  SYNTHROID, LEVOTHROID TAKE 1 TABLET BY MOUTH ONCE DAILY   losartan 100 MG tablet Commonly known as:  COZAAR TAKE 1 TABLET EVERY DAY FOR BLOOD PRESSURE   multivitamin with minerals Tabs tablet Take 1 tablet by mouth daily.   polyethylene glycol powder powder Commonly known  as:  GLYCOLAX/MIRALAX Take 17 g by mouth daily as needed.   vitamin B-12 1000 MCG tablet Commonly known as:  CYANOCOBALAMIN Take 1,000 mcg by mouth daily.   VITAMIN D-3 PO Take by mouth. 1,000mg  one daily       Review of Systems  Constitutional: Negative.   HENT: Negative for congestion, dental problem, hearing loss, rhinorrhea, sinus pressure, sore throat and tinnitus.   Eyes: Negative for pain, discharge and visual disturbance.  Respiratory: Positive for cough. Negative for shortness of breath.   Cardiovascular: Negative for chest pain, palpitations and leg swelling.  Gastrointestinal: Positive for constipation. Negative for abdominal distention, abdominal pain, blood in stool, diarrhea, nausea and vomiting.  Endocrine:       History of hypothyroidism  Genitourinary: Negative for difficulty urinating, dysuria, flank pain, frequency, hematuria, pelvic pain, urgency, vaginal bleeding, vaginal discharge and vaginal pain.  Musculoskeletal: Positive for back pain and gait problem. Negative for arthralgias and joint swelling.       Bilateral shoulder discomfort and limited range of motion.  Skin: Negative for rash.       History of onychomycosis followed by Dr. Mallie Mussel. History melanoma of the right chin and neck. Removed by Moh's surgery for 03/20/15.  Neurological: Negative for dizziness, syncope, speech difficulty, weakness, numbness and headaches.       History of dementia. Currently on Aricept.  Hematological: Negative for adenopathy.       History myelodysplasia with anemia. Followed by Dr. Alvy Bimler.  Psychiatric/Behavioral: Positive for confusion and sleep disturbance (difficulty arousing from sleep in the morning. Hypersomnia.). Negative for agitation, behavioral problems and dysphoric mood. The patient is not nervous/anxious.     Immunization History  Administered Date(s) Administered  . Influenza Split 10/17/2011, 09/14/2012  . Influenza Whole 10/17/2007  . Influenza, High  Dose Seasonal PF 10/11/2013, 10/05/2014  . Influenza-Unspecified 09/05/2015, 10/02/2016  . PPD Test 01/12/2015  . Pneumococcal Conjugate-13 09/29/2014  . Pneumococcal Polysaccharide-23 12/16/1998  . Tdap 04/23/2011  . Zoster 07/04/2006   Pertinent  Health Maintenance Due  Topic Date Due  . OPHTHALMOLOGY EXAM  11/15/2014  . FOOT EXAM  11/30/2014  . HEMOGLOBIN A1C  10/11/2015  . INFLUENZA VACCINE  07/16/2017  . DEXA SCAN  Completed  . PNA vac Low Risk Adult  Completed   Fall Risk  07/04/2016 01/04/2016 04/06/2015 09/29/2014  Falls in the past year? No No No No   Functional Status Survey:    Vitals:   05/15/17 1101  BP: 119/60  Pulse: 64  Resp: 18  Temp: 99 F (37.2 C)  Weight: 156 lb 8 oz (71 kg)  Height: 4\' 11"  (1.499 m)   Body mass index is 31.61 kg/m. Physical Exam  Constitutional: She is oriented to person, place, and  time. She appears well-developed and well-nourished.  Blood pressure low normal  HENT:  Head: Normocephalic and atraumatic.  Right Ear: External ear normal.  Left Ear: External ear normal.  Mouth/Throat: Oropharynx is clear and moist.  Eyes: Conjunctivae and EOM are normal.  Neck: Normal range of motion. Neck supple. No JVD present. No thyromegaly present.  Cardiovascular: Normal rate, regular rhythm and intact distal pulses.   Murmur heard. 3/6   Pulmonary/Chest: Effort normal. She has no wheezes. She has rales.  Posterior mid to lower lung rales.   Abdominal: Soft. Bowel sounds are normal. She exhibits no distension and no mass. There is no tenderness. There is no rebound and no guarding.  Lower midline scar  Genitourinary: Vagina normal. Rectal exam shows guaiac negative stool.  Musculoskeletal: Normal range of motion. She exhibits no edema or tenderness.  Bilateral total knee replacement scars. Unstable gait. Decreased range of motion at shoulders.  Neurological: She is alert and oriented to person, place, and time. She has normal reflexes. No  cranial nerve deficit. She exhibits normal muscle tone. Coordination normal.  07/04/16 MMSE 27/30. Failed clock drawing.  Skin: Skin is warm and dry. No rash noted.  Scar right chin and neck. Bilateral onychomycosis of the toes.  Psychiatric: She has a normal mood and affect. Her behavior is normal.  Mild confusion    Labs reviewed:  Recent Labs  08/06/16 1059 10/24/16 01/07/17 04/22/17  NA 142 142 140 141  K 4.4 4.8 4.9 5.1  CO2 23  --   --   --   GLUCOSE 102  --   --   --   BUN 35.0* 28* 30* 31*  CREATININE 1.5* 1.1 1.2* 1.2*  CALCIUM 10.4  --   --   --     Recent Labs  08/06/16 1059 10/24/16 01/07/17  AST 19 17 15   ALT 24 21 20   ALKPHOS 69 61 59  BILITOT 0.61  --   --   PROT 6.7  --   --   ALBUMIN 3.9  --   --     Recent Labs  08/06/16 1058 10/24/16 01/07/17 04/22/17  WBC 8.2 7.4 6.7 7.2  NEUTROABS 5.7  --   --   --   HGB 10.4* 9.7* 9.9* 9.4*  HCT 31.1* 29* 30* 27*  MCV 85.7  --   --   --   PLT 368 365 406* 373   Lab Results  Component Value Date   TSH 1.23 04/22/2017   Lab Results  Component Value Date   HGBA1C 5.6 04/11/2015   Lab Results  Component Value Date   CHOL 147 04/22/2017   HDL 47 04/22/2017   LDLCALC 77 04/22/2017   LDLDIRECT 131.2 11/30/2013   TRIG 125 04/22/2017   CHOLHDL 4 11/30/2013    Significant Diagnostic Results in last 30 days:  No results found.  Assessment/Plan Essential hypertension Controlled, continue Losartan 100mg  daily, 04/22/17 TSH 1.23, LDL 77, Na 141, K 5.1, Bun 31, creat 1.21, wbc 7.2, Hgb 9.4, plt 373  Constipation Stable, diet and prn daily Miralax to ensure regular BM  Hypothyroidism continue Levothyroxine 40mcg daily, TSH 1.23 04/22/17  Dementia Seen by neurology at [facility] in November 2013. MRI revealed multi-infarct dementia. Aricept prescribed 04/06/15 MMSE 25/30, failed clock drawing.  Continue Aricept   CKD (chronic kidney disease) stage 2, GFR 60-89 ml/min Last creat 1.21  04/22/17  Myelodysplastic syndrome ([place]) 04/22/17 Hgb 9.4 Continue Vit B12     Family/ staff  Communication: AL  Labs/tests ordered:  none

## 2017-05-15 NOTE — Assessment & Plan Note (Signed)
Last creat 1.21 04/22/17

## 2017-05-15 NOTE — Assessment & Plan Note (Signed)
Stable, diet and prn daily Miralax to ensure regular BM

## 2017-05-15 NOTE — Assessment & Plan Note (Signed)
Seen by neurology at [facility] in November 2013. MRI revealed multi-infarct dementia. Aricept prescribed 04/06/15 MMSE 25/30, failed clock drawing.  Continue Aricept

## 2017-08-08 ENCOUNTER — Encounter: Payer: Self-pay | Admitting: Internal Medicine

## 2017-08-08 ENCOUNTER — Non-Acute Institutional Stay: Payer: Medicare Other | Admitting: Internal Medicine

## 2017-08-08 DIAGNOSIS — D638 Anemia in other chronic diseases classified elsewhere: Secondary | ICD-10-CM

## 2017-08-08 DIAGNOSIS — R0609 Other forms of dyspnea: Secondary | ICD-10-CM

## 2017-08-08 DIAGNOSIS — N183 Chronic kidney disease, stage 3 unspecified: Secondary | ICD-10-CM | POA: Insufficient documentation

## 2017-08-08 DIAGNOSIS — I10 Essential (primary) hypertension: Secondary | ICD-10-CM

## 2017-08-08 DIAGNOSIS — E039 Hypothyroidism, unspecified: Secondary | ICD-10-CM

## 2017-08-08 DIAGNOSIS — F0150 Vascular dementia without behavioral disturbance: Secondary | ICD-10-CM

## 2017-08-08 DIAGNOSIS — Z8673 Personal history of transient ischemic attack (TIA), and cerebral infarction without residual deficits: Secondary | ICD-10-CM

## 2017-08-08 NOTE — Progress Notes (Signed)
Location:  [place] Room Number: [place] of Service:  ALF [phone]) Provider:  Blanchie Serve MD  Blanchie Serve, MD  Patient Care Team: Blanchie Serve, MD as PCP - General (Internal Medicine) Milus Banister, MD as Consulting Physician (Gastroenterology) Heath Lark, MD as Consulting Physician (Hematology and Oncology) Mast, Man X, NP as Nurse Practitioner (Internal Medicine)  Extended Emergency Contact Information Primary Emergency Contact: [facility] Address: [address]          [place], [place] [address] [name] of [place] Phone: [phone] Work Phone: [phone] Mobile Phone: [phone] Relation: Son Secondary Emergency Contact: [name],[name] Address: [place] [address] [name] of [place] Phone: [phone] Relation: Daughter  Code Status:  DNR  Goals of care: Advanced Directive information Advanced Directives 05/15/2017  Does Patient Have a Medical Advance Directive? Yes  Type of Advance Directive Out of facility DNR (pink MOST or yellow form)  Does patient want to make changes to medical advance directive? No - Patient declined  Copy of [place] in Chart? -  Would patient like information on creating a medical advance directive? -  Pre-existing out of facility DNR order (yellow form or pink MOST form) Yellow form placed in chart (order not valid for inpatient use)     Chief Complaint  Patient presents with  . Medical Management of Chronic Issues    Routine Visit     HPI:  Pt is a 83 y.o. female seen today for medical management of chronic diseases.  She is seen in her room today. She denies any concern. She gets around with her walker. No fall reported. She communicates her needs well. She goes to dining area for meals. She takes her medications. She needs cueing with her dementia. She participates some in group activities. Denies any concern this visit. BP  reading have been stable. Denies problem with bowel movement. No fall reported.    Past Medical History:  Diagnosis Date  . Blood transfusion 2006  . Colon polyps   . CVA (cerebral infarction) 2013  . Dementia   . Diverticulosis of colon   . DJD (degenerative joint disease)   . Hyperlipidemia   . Hypertension   . Hypothyroidism   . Melanoma ([place]) 03/20/15   removed with Moh's surgery  . Myelodysplasia 02/04/2012  . Normochromic normocytic anemia 02/04/2012  . Onychomycosis   . Osteoporosis   . Rectal prolapse   . Shoulder pain   . Unstable gait    Past Surgical History:  Procedure Laterality Date  . APPENDECTOMY    . CARPAL TUNNEL RELEASE Bilateral 2006  . FLEXIBLE SIGMOIDOSCOPY  07/09/2012   Procedure: FLEXIBLE SIGMOIDOSCOPY;  Surgeon: Inda Castle, MD;  Location: [facility];  Service: Endoscopy;  Laterality: N/A;  . MELANOMA EXCISION Right 2005   arm  . POLYPECTOMY  2004   Laparoscopic  . RECTOCELE REPAIR    . REPLACEMENT TOTAL KNEE  2005 & 2006  . VAGINAL HYSTERECTOMY  1989    Allergies  Allergen Reactions  . Lisinopril     REACTION: angioedema  . Penicillins     REACTION: rash    Outpatient Encounter Prescriptions as of 08/08/2017  Medication Sig  . acetaminophen (TYLENOL) 500 MG tablet Take 500 mg by mouth every 8 (eight) hours as needed.   Marland Kitchen aspirin EC 81 MG tablet Take 81 mg by mouth daily.  . Calcium Carbonate-Vit  D-Min (CALTRATE 600+D PLUS) 600-400 MG-UNIT per tablet Take 1 tablet by mouth daily.    . Cholecalciferol (VITAMIN D-3 PO) Take by mouth. 1,000mg  one daily  . cycloSPORINE (RESTASIS) 0.05 % ophthalmic emulsion Place 1 drop into both eyes 2 (two) times daily.    Marland Kitchen donepezil (ARICEPT) 10 MG tablet Take 10 mg by mouth daily.  Marland Kitchen levothyroxine (SYNTHROID, LEVOTHROID) 88 MCG tablet TAKE 1 TABLET BY MOUTH ONCE DAILY  . losartan (COZAAR) 100 MG tablet TAKE 1 TABLET EVERY DAY FOR BLOOD PRESSURE  . Multiple Vitamin (MULTIVITAMIN WITH MINERALS) TABS Take  1 tablet by mouth daily.  . polyethylene glycol powder (GLYCOLAX/MIRALAX) powder Take 17 g by mouth daily as needed.  . vitamin B-12 (CYANOCOBALAMIN) 1000 MCG tablet Take 1,000 mcg by mouth daily.   No facility-administered encounter medications on file as of 08/08/2017.     Review of Systems  Constitutional: Negative for appetite change and fever.  HENT: Positive for hearing loss. Negative for congestion, ear pain, mouth sores, rhinorrhea, sore throat and trouble swallowing.   Eyes: Positive for visual disturbance.       Has corrective glasses  Respiratory: Negative for cough and shortness of breath.        Denies shortness of breath but appears short of breath with exertion  Cardiovascular: Negative for chest pain and palpitations.  Gastrointestinal: Negative for abdominal pain, constipation, diarrhea, nausea and vomiting.  Genitourinary: Negative for dysuria and flank pain.  Musculoskeletal: Positive for back pain and gait problem.  Skin: Negative for rash and wound.  Neurological: Negative for syncope, light-headedness and headaches.  Psychiatric/Behavioral: Positive for confusion. Negative for behavioral problems.    Immunization History  Administered Date(s) Administered  . Influenza Split 10/17/2011, 09/14/2012  . Influenza Whole 10/17/2007  . Influenza, High Dose Seasonal PF 10/11/2013, 10/05/2014  . Influenza-Unspecified 09/05/2015, 10/02/2016  . PPD Test 01/12/2015  . Pneumococcal Conjugate-13 09/29/2014  . Pneumococcal Polysaccharide-23 12/16/1998  . Tdap 04/23/2011  . Zoster 07/04/2006   Pertinent  Health Maintenance Due  Topic Date Due  . OPHTHALMOLOGY EXAM  11/15/2014  . FOOT EXAM  11/30/2014  . HEMOGLOBIN A1C  10/11/2015  . INFLUENZA VACCINE  07/16/2017  . DEXA SCAN  Completed  . PNA vac Low Risk Adult  Completed   Fall Risk  07/04/2016 01/04/2016 04/06/2015 09/29/2014  Falls in the past year? No No No No   Functional Status Survey:    Vitals:   08/08/17  1255  BP: 118/72  Pulse: 68  Resp: 20  Temp: 97.9 F (36.6 C)  TempSrc: Oral  Weight: 157 lb (71.2 kg)  Height: 4\' 11"  (1.499 m)   Body mass index is 31.71 kg/m. Physical Exam  Constitutional:  Obese, elderly female in no acute distress  HENT:  Head: Normocephalic and atraumatic.  Mouth/Throat: Oropharynx is clear and moist.  Eyes: Conjunctivae and EOM are normal.  Neck: Normal range of motion. Neck supple. No thyromegaly present.  Cardiovascular: Normal rate and regular rhythm.   Systolic murmur present  Pulmonary/Chest: Effort normal. No respiratory distress. She has no wheezes. She has rales.  Abdominal: Bowel sounds are normal. There is no tenderness. There is no guarding.  Musculoskeletal: She exhibits no edema.  Can move all 4 extremities, unsteady gait, uses walker, has old surgical scar to both knees  Lymphadenopathy:    She has no cervical adenopathy.  Neurological: She is alert.  Oriented to person and place only, not to time  Skin: Skin is warm and dry.  Psychiatric: She has a normal mood and affect.    Labs reviewed:  Recent Labs  10/24/16 01/07/17 04/22/17  NA 142 140 141  K 4.8 4.9 5.1  BUN 28* 30* 31*  CREATININE 1.1 1.2* 1.2*    Recent Labs  10/24/16 01/07/17  AST 17 15  ALT 21 20  ALKPHOS 61 59    Recent Labs  10/24/16 01/07/17 04/22/17  WBC 7.4 6.7 7.2  HGB 9.7* 9.9* 9.4*  HCT 29* 30* 27*  PLT 365 406* 373   Lab Results  Component Value Date   TSH 1.23 04/22/2017   Lab Results  Component Value Date   HGBA1C 5.6 04/11/2015   Lab Results  Component Value Date   CHOL 147 04/22/2017   HDL 47 04/22/2017   LDLCALC 77 04/22/2017   LDLDIRECT 131.2 11/30/2013   TRIG 125 04/22/2017   CHOLHDL 4 11/30/2013    Significant Diagnostic Results in last 30 days:  No results found.  Assessment/Plan  Dyspnea Appears to have dyspnea with exertion. Denies symptom. Has rales on exam. Low Hb on review and has history of MDS. If hemoglobin is  stable, obtain chest xray and echocardiogram.   Essential Hypertension Stable BP on review. Continue losartan 100 mg daily  History of CVA Continue aspirin ec 81 mg daily. LDL close to goal. Not on statin. Given her age and dementia, will not have her on statin.   ckd stage 3 Monitor renal function. Continue antihypertensive.   Anemia of chronic disease With myelodysplastic syndrome. S/p erythropoetin injection in past for Hb <10. Continue b12 supplement, reviewed cbc. Reviewed oncology note and goal is for f/u with oncology if Hb <9. Given her dyspnea and history of MDS, check cbc and if Hb <9, will set oncology f/u  Vascular dementia without behavioral disturbance Supportive care, continue her aspirin. Continue donepezil 10 mg daily. MMSE 27/30 on 07/04/16. Check MMSE.   Hypothyroidism Lab Results  Component Value Date   TSH 1.23 04/22/2017   Stable TSH. Continue levothyroxine 88 mcg daily   Family/ staff Communication: reviewed care plan with patient and charge nurse.    Labs/tests ordered:  CBC, MMSE   Blanchie Serve, MD Internal Medicine [facility] [address] [place], [place] [address] Cell Phone (Monday-Friday 8 am - 5 pm): [phone] On Call: [phone] and follow prompts after 5 pm and on weekends Office Phone: [phone] Office Fax: [phone]

## 2017-08-12 ENCOUNTER — Other Ambulatory Visit: Payer: Self-pay | Admitting: *Deleted

## 2017-08-12 ENCOUNTER — Encounter: Payer: Self-pay | Admitting: *Deleted

## 2017-08-12 LAB — CBC AND DIFFERENTIAL
HCT: 30 — AB (ref 36–46)
HEMOGLOBIN: 10 — AB (ref 12.0–16.0)
Platelets: 395 (ref 150–399)
WBC: 8.1

## 2017-09-02 ENCOUNTER — Non-Acute Institutional Stay: Payer: Medicare Other

## 2017-09-02 DIAGNOSIS — Z0000 Encounter for general adult medical examination without abnormal findings: Secondary | ICD-10-CM

## 2017-09-02 NOTE — Patient Instructions (Signed)
Brittany [name] , Thank you for taking time to come for your Medicare Wellness Visit. I appreciate your ongoing commitment to your health goals. Please review the following plan we discussed and let me know if I can assist you in the future.   Screening recommendations/referrals: Colonoscopy excluded, pt is over 85 Mammogram excluded, pt is over age 81 Bone Density up to date  Recommended yearly ophthalmology/optometry visit for glaucoma screening and checkup Recommended yearly dental visit for hygiene and checkup  Vaccinations: Influenza vaccine due Pneumococcal vaccine up to date Tdap vaccine up to date. Due 04/22/2021 Shingles vaccine not in records    Advanced directives: DNR in chart, copies of health care power of attorney and living will are needed   Conditions/risks identified: None  Next appointment: Dr. Bubba Camp makes rounds   Preventive Care 69 Years and Older, Female Preventive care refers to lifestyle choices and visits with your health care provider that can promote health and wellness. What does preventive care include?  A yearly physical exam. This is also called an annual well check.  Dental exams once or twice a year.  Routine eye exams. Ask your health care provider how often you should have your eyes checked.  Personal lifestyle choices, including:  Daily care of your teeth and gums.  Regular physical activity.  Eating a healthy diet.  Avoiding tobacco and drug use.  Limiting alcohol use.  Practicing safe sex.  Taking low-dose aspirin every day.  Taking vitamin and mineral supplements as recommended by your health care provider. What happens during an annual well check? The services and screenings done by your health care provider during your annual well check will depend on your age, overall health, lifestyle risk factors, and family history of disease. Counseling  Your health care provider may ask you questions about your:  Alcohol use.  Tobacco  use.  Drug use.  Emotional well-being.  Home and relationship well-being.  Sexual activity.  Eating habits.  History of falls.  Memory and ability to understand (cognition).  Work and work Statistician.  Reproductive health. Screening  You may have the following tests or measurements:  Height, weight, and BMI.  Blood pressure.  Lipid and cholesterol levels. These may be checked every 5 years, or more frequently if you are over 36 years old.  Skin check.  Lung cancer screening. You may have this screening every year starting at age 51 if you have a 30-pack-year history of smoking and currently smoke or have quit within the past 15 years.  Fecal occult blood test (FOBT) of the stool. You may have this test every year starting at age 10.  Flexible sigmoidoscopy or colonoscopy. You may have a sigmoidoscopy every 5 years or a colonoscopy every 10 years starting at age 58.  Hepatitis C blood test.  Hepatitis B blood test.  Sexually transmitted disease (STD) testing.  Diabetes screening. This is done by checking your blood sugar (glucose) after you have not eaten for a while (fasting). You may have this done every 1-3 years.  Bone density scan. This is done to screen for osteoporosis. You may have this done starting at age 26.  Mammogram. This may be done every 1-2 years. Talk to your health care provider about how often you should have regular mammograms. Talk with your health care provider about your test results, treatment options, and if necessary, the need for more tests. Vaccines  Your health care provider may recommend certain vaccines, such as:  Influenza vaccine. This is  recommended every year.  Tetanus, diphtheria, and acellular pertussis (Tdap, Td) vaccine. You may need a Td booster every 10 years.  Zoster vaccine. You may need this after age 73.  Pneumococcal 13-valent conjugate (PCV13) vaccine. One dose is recommended after age 45.  Pneumococcal  polysaccharide (PPSV23) vaccine. One dose is recommended after age 38. Talk to your health care provider about which screenings and vaccines you need and how often you need them. This information is not intended to replace advice given to you by your health care provider. Make sure you discuss any questions you have with your health care provider. Document Released: 12/29/2015 Document Revised: 08/21/2016 Document Reviewed: 10/03/2015 Elsevier Interactive Patient Education  2017 [employer] Prevention in the Home Falls can cause injuries. They can happen to people of all ages. There are many things you can do to make your home safe and to help prevent falls. What can I do on the outside of my home?  Regularly fix the edges of walkways and driveways and fix any cracks.  Remove anything that might make you trip as you walk through a door, such as a raised step or threshold.  Trim any bushes or trees on the path to your home.  Use bright outdoor lighting.  Clear any walking paths of anything that might make someone trip, such as rocks or tools.  Regularly check to see if handrails are loose or broken. Make sure that both sides of any steps have handrails.  Any raised decks and porches should have guardrails on the edges.  Have any leaves, snow, or ice cleared regularly.  Use sand or salt on walking paths during winter.  Clean up any spills in your garage right away. This includes oil or grease spills. What can I do in the bathroom?  Use night lights.  Install grab bars by the toilet and in the tub and shower. Do not use towel bars as grab bars.  Use non-skid mats or decals in the tub or shower.  If you need to sit down in the shower, use a plastic, non-slip stool.  Keep the floor dry. Clean up any water that spills on the floor as soon as it happens.  Remove soap buildup in the tub or shower regularly.  Attach bath mats securely with double-sided non-slip rug  tape.  Do not have throw rugs and other things on the floor that can make you trip. What can I do in the bedroom?  Use night lights.  Make sure that you have a light by your bed that is easy to reach.  Do not use any sheets or blankets that are too big for your bed. They should not hang down onto the floor.  Have a firm chair that has side arms. You can use this for support while you get dressed.  Do not have throw rugs and other things on the floor that can make you trip. What can I do in the kitchen?  Clean up any spills right away.  Avoid walking on wet floors.  Keep items that you use a lot in easy-to-reach places.  If you need to reach something above you, use a strong step stool that has a grab bar.  Keep electrical cords out of the way.  Do not use floor polish or wax that makes floors slippery. If you must use wax, use non-skid floor wax.  Do not have throw rugs and other things on the floor that can make you trip.  What can I do with my stairs?  Do not leave any items on the stairs.  Make sure that there are handrails on both sides of the stairs and use them. Fix handrails that are broken or loose. Make sure that handrails are as long as the stairways.  Check any carpeting to make sure that it is firmly attached to the stairs. Fix any carpet that is loose or worn.  Avoid having throw rugs at the top or bottom of the stairs. If you do have throw rugs, attach them to the floor with carpet tape.  Make sure that you have a light switch at the top of the stairs and the bottom of the stairs. If you do not have them, ask someone to add them for you. What else can I do to help prevent falls?  Wear shoes that:  Do not have high heels.  Have rubber bottoms.  Are comfortable and fit you well.  Are closed at the toe. Do not wear sandals.  If you use a stepladder:  Make sure that it is fully opened. Do not climb a closed stepladder.  Make sure that both sides of the  stepladder are locked into place.  Ask someone to hold it for you, if possible.  Clearly mark and make sure that you can see:  Any grab bars or handrails.  First and last steps.  Where the edge of each step is.  Use tools that help you move around (mobility aids) if they are needed. These include:  Canes.  Walkers.  Scooters.  Crutches.  Turn on the lights when you go into a dark area. Replace any light bulbs as soon as they burn out.  Set up your furniture so you have a clear path. Avoid moving your furniture around.  If any of your floors are uneven, fix them.  If there are any pets around you, be aware of where they are.  Review your medicines with your doctor. Some medicines can make you feel dizzy. This can increase your chance of falling. Ask your doctor what other things that you can do to help prevent falls. This information is not intended to replace advice given to you by your health care provider. Make sure you discuss any questions you have with your health care provider. Document Released: 09/28/2009 Document Revised: 05/09/2016 Document Reviewed: 01/06/2015 Elsevier Interactive Patient Education  2017 [employer].

## 2017-09-02 NOTE — Progress Notes (Signed)
Subjective:   Brittany [name] is a 83 y.o. female who presents for an Initial Medicare Annual Wellness Visit at [facility]    Objective:    Today's Vitals   09/02/17 1242  BP: (!) 144/58  Pulse: 75  Temp: (!) 97.3 F (36.3 C)  TempSrc: Oral  SpO2: 95%  Weight: 157 lb (71.2 kg)  Height: 4\' 11"  (1.499 m)   Body mass index is 31.71 kg/m.   Current Medications (verified) Outpatient Encounter Prescriptions as of 09/02/2017  Medication Sig  . acetaminophen (TYLENOL) 500 MG tablet Take 500 mg by mouth every 8 (eight) hours as needed.   Marland Kitchen aspirin EC 81 MG tablet Take 81 mg by mouth daily.  . Calcium Carbonate-Vit D-Min (CALTRATE 600+D PLUS) 600-400 MG-UNIT per tablet Take 1 tablet by mouth daily.    . Cholecalciferol (VITAMIN D-3 PO) Take by mouth. 1,000mg  one daily  . cycloSPORINE (RESTASIS) 0.05 % ophthalmic emulsion Place 1 drop into both eyes 2 (two) times daily.    Marland Kitchen donepezil (ARICEPT) 10 MG tablet Take 10 mg by mouth daily.  Marland Kitchen levothyroxine (SYNTHROID, LEVOTHROID) 88 MCG tablet TAKE 1 TABLET BY MOUTH ONCE DAILY  . losartan (COZAAR) 100 MG tablet TAKE 1 TABLET EVERY DAY FOR BLOOD PRESSURE  . Multiple Vitamin (MULTIVITAMIN WITH MINERALS) TABS Take 1 tablet by mouth daily.  . polyethylene glycol powder (GLYCOLAX/MIRALAX) powder Take 17 g by mouth daily as needed.  . vitamin B-12 (CYANOCOBALAMIN) 1000 MCG tablet Take 1,000 mcg by mouth daily.   No facility-administered encounter medications on file as of 09/02/2017.     Allergies (verified) Lisinopril and Penicillins   History: Past Medical History:  Diagnosis Date  . Blood transfusion 2006  . Colon polyps   . CVA (cerebral infarction) 2013  . Dementia   . Diverticulosis of colon   . DJD (degenerative joint disease)   . Hyperlipidemia   . Hypertension   . Hypothyroidism   . Melanoma ([place]) 03/20/15   removed with Moh's surgery  . Myelodysplasia 02/04/2012  . Normochromic normocytic anemia  02/04/2012  . Onychomycosis   . Osteoporosis   . Rectal prolapse   . Shoulder pain   . Unstable gait    Past Surgical History:  Procedure Laterality Date  . APPENDECTOMY    . CARPAL TUNNEL RELEASE Bilateral 2006  . FLEXIBLE SIGMOIDOSCOPY  07/09/2012   Procedure: FLEXIBLE SIGMOIDOSCOPY;  Surgeon: Inda Castle, MD;  Location: [facility] END[facility]COPY;  Service: Endoscopy;  Laterality: N/A;  . MELANOMA EXCISION Right 2005   arm  . POLYPECTOMY  2004   Laparoscopic  . RECTOCELE REPAIR    . REPLACEMENT TOTAL KNEE  2005 & 2006  . VAGINAL HYSTERECTOMY  1989   Family History  Problem Relation Age of Onset  . Prostate cancer Brother   . Cancer Brother        renal cell carcinoma  . Heart disease Brother   . Breast cancer Mother 33  . Cancer Father        unknown kind  . Arthritis Daughter    Social History   Occupational History  . piano teacher    Social History Main Topics  . Smoking status: Never Smoker  . Smokeless tobacco: Never Used  . Alcohol use No  . Drug use: No  . Sexual activity: Yes    Birth control/ protection: Surgical     Comment: HYST    Tobacco Counseling Counseling given: Not Answered   Activities of  Daily Living In your present state of health, do you have any difficulty performing the following activities: 09/02/2017  Hearing? N  Vision? N  Difficulty concentrating or making decisions? Y  Walking or climbing stairs? Y  Dressing or bathing? N  Doing errands, shopping? Y  Preparing Food and eating ? Y  Using the Toilet? Y  In the past six months, have you accidently leaked urine? Y  Do you have problems with loss of bowel control? N  Managing your Medications? Y  Managing your Finances? Y  Housekeeping or managing your Housekeeping? Y  Some recent data might be hidden    Immunizations and Health Maintenance Immunization History  Administered Date(s) Administered  . Influenza Split 10/17/2011, 09/14/2012  . Influenza Whole 10/17/2007  . Influenza,  High Dose Seasonal PF 10/11/2013, 10/05/2014  . Influenza-Unspecified 09/05/2015, 10/02/2016  . PPD Test 01/12/2015  . Pneumococcal Conjugate-13 09/29/2014  . Pneumococcal Polysaccharide-23 12/16/1998  . Tdap 04/23/2011  . Zoster 07/04/2006   Health Maintenance Due  Topic Date Due  . OPHTHALMOLOGY EXAM  11/15/2014  . FOOT EXAM  11/30/2014  . HEMOGLOBIN A1C  10/11/2015  . INFLUENZA VACCINE  07/16/2017    Patient Care Team: Blanchie Serve, MD as PCP - General (Internal Medicine) Milus Banister, MD as Consulting Physician (Gastroenterology) Heath Lark, MD as Consulting Physician (Hematology and Oncology) Mast, Man X, NP as Nurse Practitioner (Internal Medicine)  Indicate any recent Medical Services you may have received from other than [facility] providers in the past year (date may be approximate).     Assessment:   This is a routine wellness examination for Brittany [name].   Hearing/Vision screen No exam data present  Dietary issues and exercise activities discussed: Current Exercise Habits: The patient does not participate in regular exercise at present, Exercise limited by: None identified  Goals    None     Depression Screen PHQ 2/9 Scores 09/02/2017 07/04/2016 01/04/2016 04/06/2015 09/29/2014  PHQ - 2 Score 0 0 0 0 0    Fall Risk Fall Risk  09/02/2017 07/04/2016 01/04/2016 04/06/2015 09/29/2014  Falls in the past year? No No No No No    Cognitive Function: MMSE - Mini Mental State Exam 08/12/2017 08/11/2017 07/04/2016 04/06/2015  Orientation to time 3 3 3 3   Orientation to Place 5 5 4 3   Registration 3 3 3 3   Attention/ Calculation 5 5 5 5   Recall 3 3 3 3   Language- name 2 objects 2 2 2 2   Language- repeat 1 1 1 1   Language- follow 3 step command 3 3 3 2   Language- read & follow direction 1 1 1 1   Write a sentence 1 1 1 1   Copy design 1 1 1 1   Total score 28 28 27 25         Screening Tests Health Maintenance  Topic Date Due  . OPHTHALMOLOGY EXAM  11/15/2014  . FOOT  EXAM  11/30/2014  . HEMOGLOBIN A1C  10/11/2015  . INFLUENZA VACCINE  07/16/2017  . TETANUS/TDAP  04/22/2021  . DEXA SCAN  Completed  . PNA vac Low Risk Adult  Completed      Plan:    I have personally reviewed and addressed the Medicare Annual Wellness questionnaire and have noted the following in the patient's chart:  A. Medical and social history B. Use of alcohol, tobacco or illicit drugs  C. Current medications and supplements D. Functional ability and status E.  Nutritional status F.  Physical activity G. Advance directives  H. List of other physicians I.  Hospitalizations, surgeries, and ER visits in previous 12 months J.  [place] to include hearing, vision, cognitive, depression L. Referrals and appointments - none  In addition, I have reviewed and discussed with patient certain preventive protocols, quality metrics, and best practice recommendations. A written personalized care plan for preventive services as well as general preventive health recommendations were provided to patient.  See attached scanned questionnaire for additional information.   Signed,   Rich Reining, RN Nurse Health Advisor   Quick Notes   Health Maintenance: Foot exam, eye exam and flu vaccine due     Abnormal Screen: MMSE 28/30 on 08/11/17     Patient Concerns: None     Nurse Concerns: None

## 2017-11-10 ENCOUNTER — Non-Acute Institutional Stay: Payer: Medicare Other | Admitting: Nurse Practitioner

## 2017-11-10 ENCOUNTER — Encounter: Payer: Self-pay | Admitting: Nurse Practitioner

## 2017-11-10 DIAGNOSIS — D469 Myelodysplastic syndrome, unspecified: Secondary | ICD-10-CM

## 2017-11-10 DIAGNOSIS — I10 Essential (primary) hypertension: Secondary | ICD-10-CM

## 2017-11-10 DIAGNOSIS — D638 Anemia in other chronic diseases classified elsewhere: Secondary | ICD-10-CM

## 2017-11-10 DIAGNOSIS — E039 Hypothyroidism, unspecified: Secondary | ICD-10-CM | POA: Diagnosis not present

## 2017-11-10 DIAGNOSIS — F0150 Vascular dementia without behavioral disturbance: Secondary | ICD-10-CM

## 2017-11-10 NOTE — Progress Notes (Signed)
Location:   [place] Room Number: 802 Place of Service: [facility] Provider:  [facility] Karry Causer NP  Blanchie Serve, MD  Patient Care Team: Blanchie Serve, MD as PCP - General (Internal Medicine) Milus Banister, MD as Consulting Physician (Gastroenterology) Heath Lark, MD as Consulting Physician (Hematology and Oncology) Selicia Windom X, NP as Nurse Practitioner (Internal Medicine)  Extended Emergency Contact Information Primary Emergency Contact: [facility] Address: [address]          [place], [place] [address] [name] of [place] Phone: [phone] Work Phone: [phone] Mobile Phone: [phone] Relation: Son Secondary Emergency Contact: [name],[name] Address: [place] [address] [name] of [place] Phone: [phone] Relation: Daughter  Code Status:  DNR Goals of care: Advanced Directive information Advanced Directives 09/02/2017  Does Patient Have a Medical Advance Directive? Yes  Type of Advance Directive Out of facility DNR (pink MOST or yellow form)  Does patient want to make changes to medical advance directive? No - Patient declined  Copy of [place] in Chart? -  Would patient like information on creating a medical advance directive? -  Pre-existing out of facility DNR order (yellow form or pink MOST form) Yellow form placed in chart (order not valid for inpatient use);Pink MOST form placed in chart (order not valid for inpatient use)     Chief Complaint  Patient presents with  . Medical Management of Chronic Issues    HPI:  Pt is a 83 y.o. female seen today for medical management of chronic diseases.     The patient has history of HTN, blood pressure is controlled on Losartan 100mg  daily, she resides in [facility], transfers self, ambulates with walker, taking Donepezil 10mg  daily for memory, last MMSE 28/30 8/28/18She takes Vit B12 1050mcg po daily, last Hgb 10.0 08/12/17. Hypothyroidism,  taking Levothyroxine 38mcg daily, last TSH 1.23 04/22/17.     Past Medical History:  Diagnosis Date  . Blood transfusion 2006  . Colon polyps   . CVA (cerebral infarction) 2013  . Dementia   . Diverticulosis of colon   . DJD (degenerative joint disease)   . Hyperlipidemia   . Hypertension   . Hypothyroidism   . Melanoma ([place]) 03/20/15   removed with Moh's surgery  . Myelodysplasia 02/04/2012  . Normochromic normocytic anemia 02/04/2012  . Onychomycosis   . Osteoporosis   . Rectal prolapse   . Shoulder pain   . Unstable gait    Past Surgical History:  Procedure Laterality Date  . APPENDECTOMY    . CARPAL TUNNEL RELEASE Bilateral 2006  . FLEXIBLE SIGMOIDOSCOPY  07/09/2012   Procedure: FLEXIBLE SIGMOIDOSCOPY;  Surgeon: Inda Castle, MD;  Location: [facility];  Service: Endoscopy;  Laterality: N/A;  . MELANOMA EXCISION Right 2005   arm  . POLYPECTOMY  2004   Laparoscopic  . RECTOCELE REPAIR    . REPLACEMENT TOTAL KNEE  2005 & 2006  . VAGINAL HYSTERECTOMY  1989    Allergies  Allergen Reactions  . Lisinopril     REACTION: angioedema  . Penicillins     REACTION: rash    Allergies as of 11/10/2017      Reactions   Lisinopril    REACTION: angioedema   Penicillins    REACTION: rash      Medication List        Accurate as of 11/10/17 11:59 PM. Always use your most recent med  list.          acetaminophen 500 MG tablet Commonly known as:  TYLENOL Take 500 mg by mouth every 8 (eight) hours as needed.   aspirin EC 81 MG tablet Take 81 mg by mouth daily.   CALTRATE 600+D PLUS 600-400 MG-UNIT per tablet Take 1 tablet by mouth daily.   cycloSPORINE 0.05 % ophthalmic emulsion Commonly known as:  RESTASIS Place 1 drop into both eyes 2 (two) times daily.   donepezil 10 MG tablet Commonly known as:  ARICEPT Take 10 mg by mouth daily.   levothyroxine 88 MCG tablet Commonly known as:  SYNTHROID, LEVOTHROID TAKE 1 TABLET BY MOUTH ONCE DAILY   losartan 100 MG  tablet Commonly known as:  COZAAR TAKE 1 TABLET EVERY DAY FOR BLOOD PRESSURE   multivitamin with minerals Tabs tablet Take 1 tablet by mouth daily.   polyethylene glycol powder powder Commonly known as:  GLYCOLAX/MIRALAX Take 17 g by mouth daily as needed.   vitamin B-12 1000 MCG tablet Commonly known as:  CYANOCOBALAMIN Take 1,000 mcg by mouth daily.   VITAMIN D-3 PO Take by mouth. 1,000mg  one daily     ROS was provided with assistance of staff  Review of Systems  Constitutional: Negative for activity change, appetite change, chills, diaphoresis, fatigue and fever.  HENT: Positive for hearing loss. Negative for congestion, trouble swallowing and voice change.   Eyes: Negative for visual disturbance.  Respiratory: Negative for cough, chest tightness, shortness of breath and wheezing.   Cardiovascular: Negative for chest pain, palpitations and leg swelling.  Gastrointestinal: Negative for abdominal distention, abdominal pain, constipation, diarrhea, nausea and vomiting.  Endocrine: Negative for cold intolerance.  Genitourinary: Negative for difficulty urinating, dysuria, frequency and urgency.  Musculoskeletal: Positive for gait problem. Negative for arthralgias, back pain and joint swelling.       Ambulates with walker.   Skin: Negative for color change, pallor, rash and wound.  Neurological: Negative for tremors, speech difficulty, weakness and headaches.  Psychiatric/Behavioral: Positive for confusion. Negative for agitation, behavioral problems, hallucinations and sleep disturbance. The patient is not nervous/anxious.     Immunization History  Administered Date(s) Administered  . Influenza Split 10/17/2011, 09/14/2012  . Influenza Whole 10/17/2007  . Influenza, High Dose Seasonal PF 10/11/2013, 10/05/2014  . Influenza-Unspecified 09/05/2015, 10/02/2016, 10/06/2017  . PPD Test 01/12/2015  . Pneumococcal Conjugate-13 09/29/2014  . Pneumococcal Polysaccharide-23  12/16/1998  . Tdap 04/23/2011  . Zoster 07/04/2006   Pertinent  Health Maintenance Due  Topic Date Due  . OPHTHALMOLOGY EXAM  11/15/2014  . FOOT EXAM  11/30/2014  . HEMOGLOBIN A1C  10/11/2015  . INFLUENZA VACCINE  Completed  . DEXA SCAN  Completed  . PNA vac Low Risk Adult  Completed   Fall Risk  09/02/2017 07/04/2016 01/04/2016 04/06/2015 09/29/2014  Falls in the past year? No No No No No   Functional Status Survey:    Vitals:   11/10/17 1633  BP: 120/70  Pulse: 66  Resp: 18  Temp: 97.8 F (36.6 C)   There is no height or weight on file to calculate BMI. Physical Exam  Constitutional: She appears well-developed and well-nourished.  HENT:  Head: Normocephalic and atraumatic.  Eyes: Conjunctivae and EOM are normal. Pupils are equal, round, and reactive to light.  Neck: Normal range of motion. Neck supple. No JVD present. No thyromegaly present.  Cardiovascular: Normal rate and regular rhythm.  Murmur heard. Pulmonary/Chest: Effort normal and breath sounds normal. She has no wheezes. She  has no rales.  Abdominal: Soft. Bowel sounds are normal. She exhibits no distension. There is no tenderness.  Genitourinary:  Genitourinary Comments: Average urination x1 per night.   Musculoskeletal: Normal range of motion. She exhibits no edema or tenderness.  Neurological: She is alert.  Oriented to person and place.   Skin: Skin is warm and dry. No rash noted. No erythema. No pallor.  Psychiatric: She has a normal mood and affect. Her behavior is normal. Thought content normal.    Labs reviewed: Recent Labs    01/07/17 04/22/17  NA 140 141  K 4.9 5.1  BUN 30* 31*  CREATININE 1.2* 1.2*   Recent Labs    01/07/17  AST 15  ALT 20  ALKPHOS 59   Recent Labs    01/07/17 04/22/17 08/12/17  WBC 6.7 7.2 8.1  HGB 9.9* 9.4* 10.0*  HCT 30* 27* 30*  PLT 406* 373 395   Lab Results  Component Value Date   TSH 1.23 04/22/2017   Lab Results  Component Value Date   HGBA1C 5.6  04/11/2015   Lab Results  Component Value Date   CHOL 147 04/22/2017   HDL 47 04/22/2017   LDLCALC 77 04/22/2017   LDLDIRECT 131.2 11/30/2013   TRIG 125 04/22/2017   CHOLHDL 4 11/30/2013    Significant Diagnostic Results in last 30 days:  No results found.  Assessment/Plan  Essential hypertension The patient has history of HTN, blood pressure is controlled on Losartan 100mg  daily, takes ASA 81mg  for cardiovascular risk reduction.   Hypothyroidism Hypothyroidism, stable, continue Levothyroxine 67mcg daily, last TSH 1.23 04/22/17.    Dementia she resides in [facility], transfers self, ambulates with walker, continue Donepezil 10mg  daily for memory, last MMSE 28/30 08/12/17  Anemia of chronic disease Stable, continue Vit B12 1080mcg po daily, last Hgb 10.0 08/12/17.   Myelodysplastic syndrome ([place]) dc'd from oncology, taking Vit B12, last Hgb 10.4, no longer needed any Aranesp, contact Dr Alvy Bimler if Hgb 9g/dl in the future. Last Hgb was 10.0 03/12/17.    Family/ staff Communication: plan of care reviewed with the patient and charge nurse.   Labs/tests ordered:  none  Time spend 25 minutes.

## 2017-11-10 NOTE — Assessment & Plan Note (Signed)
dc'd from oncology, taking Vit B12, last Hgb 10.4, no longer needed any Aranesp, contact Dr Brittany [name] if Hgb 9g/dl in the future. Last Hgb was 10.0 03/12/17.

## 2017-11-10 NOTE — Assessment & Plan Note (Signed)
The patient has history of HTN, blood pressure is controlled on Losartan 100mg  daily, takes ASA 81mg  for cardiovascular risk reduction.

## 2017-11-10 NOTE — Assessment & Plan Note (Signed)
she resides in [place], transfers self, ambulates with walker, continue Donepezil 10mg  daily for memory, last MMSE 28/30 08/12/17

## 2017-11-10 NOTE — Assessment & Plan Note (Signed)
Stable, continue Vit B12 1057mcg po daily, last Hgb 10.0 08/12/17.

## 2017-11-10 NOTE — Assessment & Plan Note (Signed)
Hypothyroidism, stable, continue Levothyroxine 23mcg daily, last TSH 1.23 04/22/17.

## 2017-12-18 ENCOUNTER — Other Ambulatory Visit: Payer: Self-pay | Admitting: *Deleted

## 2017-12-18 LAB — BASIC METABOLIC PANEL
BUN: 25 — AB (ref 4–21)
Creatinine: 1.1 (ref <=1.1)
GLUCOSE: 108
Potassium: 5 (ref 3.4–5.3)
Sodium: 140 (ref 137–147)

## 2018-02-19 ENCOUNTER — Non-Acute Institutional Stay: Payer: Medicare Other | Admitting: Internal Medicine

## 2018-02-19 ENCOUNTER — Encounter: Payer: Self-pay | Admitting: Internal Medicine

## 2018-02-19 DIAGNOSIS — G8929 Other chronic pain: Secondary | ICD-10-CM | POA: Diagnosis not present

## 2018-02-19 DIAGNOSIS — E039 Hypothyroidism, unspecified: Secondary | ICD-10-CM

## 2018-02-19 DIAGNOSIS — M25512 Pain in left shoulder: Secondary | ICD-10-CM | POA: Diagnosis not present

## 2018-02-19 DIAGNOSIS — R0609 Other forms of dyspnea: Secondary | ICD-10-CM | POA: Diagnosis not present

## 2018-02-19 DIAGNOSIS — D638 Anemia in other chronic diseases classified elsewhere: Secondary | ICD-10-CM

## 2018-02-19 DIAGNOSIS — M25511 Pain in right shoulder: Secondary | ICD-10-CM

## 2018-02-19 DIAGNOSIS — E538 Deficiency of other specified B group vitamins: Secondary | ICD-10-CM

## 2018-02-19 DIAGNOSIS — I10 Essential (primary) hypertension: Secondary | ICD-10-CM

## 2018-02-19 DIAGNOSIS — N183 Chronic kidney disease, stage 3 unspecified: Secondary | ICD-10-CM

## 2018-02-19 DIAGNOSIS — D469 Myelodysplastic syndrome, unspecified: Secondary | ICD-10-CM

## 2018-02-19 NOTE — Progress Notes (Signed)
Location:  [place] Room Number: [place] of Service:  [phone]) Provider:  Blanchie Serve MD  Blanchie Serve, MD  Patient Care Team: Blanchie Serve, MD as PCP - General (Internal Medicine) Milus Banister, MD as Consulting Physician (Gastroenterology) Heath Lark, MD as Consulting Physician (Hematology and Oncology) Mast, Man X, NP as Nurse Practitioner (Internal Medicine)  Extended Emergency Contact Information Primary Emergency Contact: [facility] Address: [address]          [place], [place] [address] [name] of [place] Phone: [phone] Work Phone: [phone] Mobile Phone: [phone] Relation: Son Secondary Emergency Contact: [name],[name] Address: [place] [address] [name] of [place] Phone: [phone] Relation: Daughter  Code Status:  DNR  Goals of care: Advanced Directive information Advanced Directives 02/19/2018  Does Patient Have a Medical Advance Directive? Yes  Type of Advance Directive Out of facility DNR (pink MOST or yellow form)  Does patient want to make changes to medical advance directive? No - Patient declined  Copy of [place] in Chart? -  Would patient like information on creating a medical advance directive? -  Pre-existing out of facility DNR order (yellow form or pink MOST form) Yellow form placed in chart (order not valid for inpatient use)     Chief Complaint  Patient presents with  . Medical Management of Chronic Issues    Routine Visit     HPI:  Pt is a 84 y.o. female seen today for medical management of chronic diseases. She denies any concern this visit. There are times when she complaints of pain to both shoulders with movement but refuses to take tylenol. She is working with occupational therapy team at present and they have brought it to staffs attention about need for anti-inflammatory medication to provide some relief.    Past  Medical History:  Diagnosis Date  . Blood transfusion 2006  . Colon polyps   . CVA (cerebral infarction) 2013  . Dementia   . Diverticulosis of colon   . DJD (degenerative joint disease)   . Hyperlipidemia   . Hypertension   . Hypothyroidism   . Melanoma ([place]) 03/20/15   removed with Moh's surgery  . Myelodysplasia 02/04/2012  . Normochromic normocytic anemia 02/04/2012  . Onychomycosis   . Osteoporosis   . Rectal prolapse   . Shoulder pain   . Unstable gait    Past Surgical History:  Procedure Laterality Date  . APPENDECTOMY    . CARPAL TUNNEL RELEASE Bilateral 2006  . FLEXIBLE SIGMOIDOSCOPY  07/09/2012   Procedure: FLEXIBLE SIGMOIDOSCOPY;  Surgeon: Inda Castle, MD;  Location: [facility];  Service: Endoscopy;  Laterality: N/A;  . MELANOMA EXCISION Right 2005   arm  . POLYPECTOMY  2004   Laparoscopic  . RECTOCELE REPAIR    . REPLACEMENT TOTAL KNEE  2005 & 2006  . VAGINAL HYSTERECTOMY  1989    Allergies  Allergen Reactions  . Lisinopril     REACTION: angioedema  . Penicillins     REACTION: rash    Outpatient Encounter Medications as of 02/19/2018  Medication Sig  . acetaminophen (TYLENOL) 500 MG tablet Take 500 mg by mouth every 8 (eight) hours as needed.   Marland Kitchen aspirin EC 81 MG tablet Take 81 mg by mouth daily.  . Calcium Carbonate-Vit D-Min (CALTRATE 600+D PLUS) 600-400 MG-UNIT per tablet Take 1 tablet by mouth daily.    Marland Kitchen  Cholecalciferol (VITAMIN D-3 PO) Take by mouth. 1,000mg  one daily  . cycloSPORINE (RESTASIS) 0.05 % ophthalmic emulsion Place 1 drop into both eyes 2 (two) times daily.    Marland Kitchen donepezil (ARICEPT) 10 MG tablet Take 10 mg by mouth daily.  . hydroxypropyl methylcellulose / hypromellose (ISOPTO TEARS / GONIOVISC) 2.5 % ophthalmic solution Place 1 drop into both eyes every 2 (two) hours as needed for dry eyes.  Marland Kitchen levothyroxine (SYNTHROID, LEVOTHROID) 88 MCG tablet TAKE 1 TABLET BY MOUTH ONCE DAILY  . losartan (COZAAR) 100 MG tablet TAKE 1 TABLET EVERY  DAY FOR BLOOD PRESSURE  . Multiple Vitamin (MULTIVITAMIN WITH MINERALS) TABS Take 1 tablet by mouth daily.  . polyethylene glycol powder (GLYCOLAX/MIRALAX) powder Take 17 g by mouth daily as needed.  . vitamin B-12 (CYANOCOBALAMIN) 1000 MCG tablet Take 1,000 mcg by mouth daily.   No facility-administered encounter medications on file as of 02/19/2018.     Review of Systems  Constitutional: Negative for appetite change, chills and fever.  HENT: Positive for hearing loss. Negative for congestion, ear pain, mouth sores, postnasal drip and trouble swallowing.   Eyes: Positive for visual disturbance. Negative for pain and discharge.  Respiratory: Positive for shortness of breath. Negative for cough and wheezing.        Dyspnea with exertion  Cardiovascular: Negative for chest pain, palpitations and leg swelling.  Gastrointestinal: Negative for abdominal pain, constipation, diarrhea, nausea and vomiting.  Genitourinary: Negative for dysuria.  Musculoskeletal: Positive for gait problem. Negative for arthralgias.       Unsteady gait, uses walker, no fall reported  Neurological: Negative for dizziness, numbness and headaches.  Psychiatric/Behavioral: Negative for behavioral problems and confusion.    Immunization History  Administered Date(s) Administered  . Influenza Split 10/17/2011, 09/14/2012  . Influenza Whole 10/17/2007  . Influenza, High Dose Seasonal PF 10/11/2013, 10/05/2014  . Influenza-Unspecified 09/05/2015, 10/02/2016, 10/06/2017  . PPD Test 01/12/2015  . Pneumococcal Conjugate-13 09/29/2014  . Pneumococcal Polysaccharide-23 12/16/1998  . Tdap 04/23/2011  . Zoster 07/04/2006   Pertinent  Health Maintenance Due  Topic Date Due  . OPHTHALMOLOGY EXAM  11/15/2014  . FOOT EXAM  11/30/2014  . HEMOGLOBIN A1C  10/11/2015  . INFLUENZA VACCINE  Completed  . DEXA SCAN  Completed  . PNA vac Low Risk Adult  Completed   Fall Risk  09/02/2017 07/04/2016 01/04/2016 04/06/2015 09/29/2014    Falls in the past year? No No No No No   Functional Status Survey:    Vitals:   02/19/18 1523  BP: 140/70  Pulse: 64  Resp: 18  Temp: 98.6 F (37 C)  TempSrc: Oral  SpO2: 97%  Weight: 156 lb (70.8 kg)  Height: 4\' 11"  (1.499 m)   Body mass index is 31.51 kg/m.   Wt Readings from Last 3 Encounters:  02/19/18 156 lb (70.8 kg)  09/02/17 157 lb (71.2 kg)  08/08/17 157 lb (71.2 kg)   Physical Exam  Constitutional: She is oriented to person, place, and time.  Obese elderly female in no acute distress  HENT:  Head: Normocephalic and atraumatic.  Mouth/Throat: Oropharynx is clear and moist. No oropharyngeal exudate.  Eyes: Conjunctivae and EOM are normal. Pupils are equal, round, and reactive to light.  Has corrective glasses  Neck: Normal range of motion. Neck supple.  Cardiovascular: Normal rate, regular rhythm and intact distal pulses.  Murmur heard. Pulmonary/Chest: Effort normal. No respiratory distress. She has no wheezes. She has no rales.  Poor air movement, rhonchi +  Abdominal: Soft. Bowel sounds are normal. There is no tenderness. There is no rebound and no guarding.  Musculoskeletal: She exhibits edema.  Unsteady gait, uses walker, arthritis changes to fingers. Limited ROM with both shoulders with pain. No scoliosis.   Lymphadenopathy:    She has no cervical adenopathy.  Neurological: She is alert and oriented to person, place, and time.  Skin: Skin is warm and dry. She is not diaphoretic.  Psychiatric: She has a normal mood and affect. Her behavior is normal.    Labs reviewed: Recent Labs    04/22/17 12/18/17  NA 141 140  K 5.1 5.0  BUN 31* 25*  CREATININE 1.2* 1.1   No results for input(s): AST, ALT, ALKPHOS, BILITOT, PROT, ALBUMIN in the last 8760 hours. Recent Labs    04/22/17 08/12/17  WBC 7.2 8.1  HGB 9.4* 10.0*  HCT 27* 30*  PLT 373 395   Lab Results  Component Value Date   TSH 1.23 04/22/2017   Lab Results  Component Value Date    HGBA1C 5.6 04/11/2015   Lab Results  Component Value Date   CHOL 147 04/22/2017   HDL 47 04/22/2017   LDLCALC 77 04/22/2017   LDLDIRECT 131.2 11/30/2013   TRIG 125 04/22/2017   CHOLHDL 4 11/30/2013    Significant Diagnostic Results in last 30 days:  No results found.  Assessment/Plan  Dyspnea on effort Reviewed chest xray from 2013 showing scoliosis but no pulmonary abnormalities. Obtain chest xray and echocardiogram to assess for chronic respiratory changes and systolic function to see if symptom is reversible. Her MDS and anemia could be contributing some along with her obesity.  Shoulder pain Bilateral right > left. Refuses to ask for prn tylenol. Denies pain this visit but definitely has limited ROM and appears uncomfortable. Start tylenol 500 mg 2 tab bid for now and reassess in 2 weeks.   b12 deficiency Continue b12 supplement for now. No signs of neuropathy reported.   Hypothyroidism Lab Results  Component Value Date   TSH 1.23 04/22/2017   Check TSH. Continue levothyroxine 88 mcg daily.   Essential Hypertension Stable BP on review. Continue losartan 100 mg daily and monitor. Reviewed BMP. Continue aspirin  Anemia of chronic disease Low but stable h&H. Has MDS. Check cbc.   ckd stage 3 Monitor renal function for now.   MDS Supportive care for now   Family/ staff Communication: reviewed care plan with patient and charge nurse.    Labs/tests ordered:  Echo, chest xray, cbc, TSH, b12   Blanchie Serve, MD Internal Medicine [facility] [address] [place], [place] [address] Cell Phone (Monday-Friday 8 am - 5 pm): [phone] On Call: [phone] and follow prompts after 5 pm and on weekends Office Phone: [phone] Office Fax: [phone]

## 2018-02-23 ENCOUNTER — Encounter: Payer: Self-pay | Admitting: Internal Medicine

## 2018-02-23 DIAGNOSIS — I5030 Unspecified diastolic (congestive) heart failure: Secondary | ICD-10-CM | POA: Insufficient documentation

## 2018-02-24 LAB — TSH: TSH: 3.18 (ref <=5.90)

## 2018-02-24 LAB — VITAMIN B12: Vitamin B-12: 2000

## 2018-02-24 LAB — CBC AND DIFFERENTIAL
HCT: 33 — AB (ref 36–46)
Hemoglobin: 11 — AB (ref 12.0–16.0)
PLATELETS: 489 — AB (ref 150–399)
WBC: 8.6

## 2018-02-26 ENCOUNTER — Other Ambulatory Visit: Payer: Self-pay | Admitting: *Deleted

## 2018-04-30 ENCOUNTER — Non-Acute Institutional Stay: Payer: Medicare Other | Admitting: Nurse Practitioner

## 2018-04-30 ENCOUNTER — Encounter: Payer: Self-pay | Admitting: Nurse Practitioner

## 2018-04-30 DIAGNOSIS — F0150 Vascular dementia without behavioral disturbance: Secondary | ICD-10-CM

## 2018-04-30 DIAGNOSIS — B372 Candidiasis of skin and nail: Secondary | ICD-10-CM | POA: Diagnosis not present

## 2018-04-30 NOTE — Progress Notes (Signed)
Location:  [facility] Room Number: 802 Place of Service:  ALF [phone]) Provider:  Daniela Hernan, Lennie Odor  NP  Blanchie Serve, MD  Patient Care Team: Blanchie Serve, MD as PCP - General (Internal Medicine) Milus Banister, MD as Consulting Physician (Gastroenterology) Heath Lark, MD as Consulting Physician (Hematology and Oncology) Ronda Kazmi X, NP as Nurse Practitioner (Internal Medicine)  Extended Emergency Contact Information Primary Emergency Contact: [facility] Address: [address]          [place], [place] [address] [name] of [place] Phone: [phone] Work Phone: [phone] Mobile Phone: [phone] Relation: Son Secondary Emergency Contact: [name],[name] Address: [place] [address] [name] of [place] Phone: [phone] Relation: Daughter  Code Status:  DNR Goals of care: Advanced Directive information Advanced Directives 04/30/2018  Does Patient Have a Medical Advance Directive? Yes  Type of Advance Directive Out of facility DNR (pink MOST or yellow form)  Does patient want to make changes to medical advance directive? No - Patient declined  Copy of [place] in Chart? -  Would patient like information on creating a medical advance directive? -  Pre-existing out of facility DNR order (yellow form or pink MOST form) Yellow form placed in chart (order not valid for inpatient use)     Chief Complaint  Patient presents with  . Acute Visit    Abdominal folds are red, irritated    HPI:  Pt is a 84 y.o. female seen today for an acute visit for itching and sensitive redness in the left groin and lower abdominal skin fold, duration unknown, HPI was provided with assistance of staff due to her dementia. She resides in [facility], taking Donepezil 10mg  daily for memory, self transfer, toileting, ambulating with walker.    Past Medical History:  Diagnosis Date  . Blood transfusion 2006  .  Colon polyps   . CVA (cerebral infarction) 2013  . Dementia   . Diverticulosis of colon   . DJD (degenerative joint disease)   . Hyperlipidemia   . Hypertension   . Hypothyroidism   . Melanoma ([place]) 03/20/15   removed with Moh's surgery  . Myelodysplasia 02/04/2012  . Normochromic normocytic anemia 02/04/2012  . Onychomycosis   . Osteoporosis   . Rectal prolapse   . Shoulder pain   . Unstable gait    Past Surgical History:  Procedure Laterality Date  . APPENDECTOMY    . CARPAL TUNNEL RELEASE Bilateral 2006  . FLEXIBLE SIGMOIDOSCOPY  07/09/2012   Procedure: FLEXIBLE SIGMOIDOSCOPY;  Surgeon: Inda Castle, MD;  Location: [facility];  Service: Endoscopy;  Laterality: N/A;  . MELANOMA EXCISION Right 2005   arm  . POLYPECTOMY  2004   Laparoscopic  . RECTOCELE REPAIR    . REPLACEMENT TOTAL KNEE  2005 & 2006  . VAGINAL HYSTERECTOMY  1989    Allergies  Allergen Reactions  . Lisinopril     REACTION: angioedema  . Penicillins     REACTION: rash    Outpatient Encounter Medications as of 04/30/2018  Medication Sig  . acetaminophen (TYLENOL) 500 MG tablet Take 500 mg by mouth every 8 (eight) hours as needed.   Marland Kitchen aspirin EC 81 MG tablet Take 81 mg by mouth daily.  . Calcium Carbonate-Vit D-Min (CALTRATE 600+D PLUS) 600-400 MG-UNIT per tablet Take 1 tablet by mouth daily.    . Cholecalciferol (VITAMIN D-3 PO) Take by mouth. 1,000mg   one daily  . cycloSPORINE (RESTASIS) 0.05 % ophthalmic emulsion Place 1 drop into both eyes 2 (two) times daily.    Marland Kitchen donepezil (ARICEPT) 10 MG tablet Take 10 mg by mouth daily.  . hydroxypropyl methylcellulose / hypromellose (ISOPTO TEARS / GONIOVISC) 2.5 % ophthalmic solution Place 1 drop into both eyes every 2 (two) hours as needed for dry eyes.  Marland Kitchen levothyroxine (SYNTHROID, LEVOTHROID) 88 MCG tablet TAKE 1 TABLET BY MOUTH ONCE DAILY  . losartan (COZAAR) 100 MG tablet TAKE 1 TABLET EVERY DAY FOR BLOOD PRESSURE  . Multiple Vitamin (MULTIVITAMIN WITH  MINERALS) TABS Take 1 tablet by mouth daily.  . polyethylene glycol powder (GLYCOLAX/MIRALAX) powder Take 17 g by mouth daily as needed.  . vitamin B-12 (CYANOCOBALAMIN) 1000 MCG tablet Take 1,000 mcg by mouth daily.   No facility-administered encounter medications on file as of 04/30/2018.   ROS was provided with assistance of staff  Review of Systems  Constitutional: Negative for activity change, appetite change, chills, diaphoresis, fatigue and fever.  HENT: Positive for hearing loss. Negative for congestion.   Respiratory: Negative for cough and shortness of breath.   Cardiovascular: Positive for leg swelling. Negative for chest pain and palpitations.  Gastrointestinal: Negative for abdominal distention.  Musculoskeletal: Positive for arthralgias and gait problem.  Skin: Positive for rash. Negative for pallor.  Neurological: Negative for dizziness, speech difficulty, weakness and headaches.       Dementia.   Psychiatric/Behavioral: Negative for agitation and behavioral problems.    Immunization History  Administered Date(s) Administered  . Influenza Split 10/17/2011, 09/14/2012  . Influenza Whole 10/17/2007  . Influenza, High Dose Seasonal PF 10/11/2013, 10/05/2014  . Influenza-Unspecified 09/05/2015, 10/02/2016, 10/06/2017  . PPD Test 01/12/2015  . Pneumococcal Conjugate-13 09/29/2014  . Pneumococcal Polysaccharide-23 12/16/1998  . Tdap 04/23/2011  . Zoster 07/04/2006   Pertinent  Health Maintenance Due  Topic Date Due  . OPHTHALMOLOGY EXAM  11/15/2014  . FOOT EXAM  11/30/2014  . HEMOGLOBIN A1C  10/11/2015  . INFLUENZA VACCINE  07/16/2018  . DEXA SCAN  Completed  . PNA vac Low Risk Adult  Completed   Fall Risk  09/02/2017 07/04/2016 01/04/2016 04/06/2015 09/29/2014  Falls in the past year? No No No No No   Functional Status Survey:    Vitals:   04/30/18 1017  BP: 120/62  Pulse: 72  Resp: 20  Temp: 99.5 F (37.5 C)  Weight: 156 lb 6.4 oz (70.9 kg)  Height: 4\' 11"   (1.499 m)   Body mass index is 31.59 kg/m. Physical Exam  Constitutional: She appears well-developed and well-nourished. No distress.  Cardiovascular: Normal rate and regular rhythm.  Murmur heard. Abdominal: Soft. Bowel sounds are normal. She exhibits no distension. There is no tenderness.  Musculoskeletal: She exhibits edema.  Trace edema BLE. Self transfer, ambulates with walker.   Neurological: She is alert. No cranial nerve deficit. She exhibits normal muscle tone. Coordination normal.  Oriented to person and place.   Skin: Skin is warm and dry. Rash noted. She is not diaphoretic. There is erythema.  redness in the left groin and lower abdominal skin fold, bright red with a few scattered satellite patterned red spots around the reddened area.  Psychiatric: She has a normal mood and affect. Her behavior is normal.    Labs reviewed: Recent Labs    12/18/17  NA 140  K 5.0  BUN 25*  CREATININE 1.1   No results for input(s): AST, ALT, ALKPHOS, BILITOT, PROT, ALBUMIN in the last  8760 hours. Recent Labs    08/12/17 02/24/18  WBC 8.1 8.6  HGB 10.0* 11.0*  HCT 30* 33*  PLT 395 489*   Lab Results  Component Value Date   TSH 3.18 02/24/2018   Lab Results  Component Value Date   HGBA1C 5.6 04/11/2015   Lab Results  Component Value Date   CHOL 147 04/22/2017   HDL 47 04/22/2017   LDLCALC 77 04/22/2017   LDLDIRECT 131.2 11/30/2013   TRIG 125 04/22/2017   CHOLHDL 4 11/30/2013    Significant Diagnostic Results in last 30 days:  No results found.  Assessment/Plan Candidiasis of skin  itching and sensitive redness in the left groin and lower abdominal skin fold, bright red with a few scattered satellite patterned red spots around the reddened area. Will apply Nystatin powder bid to affected area for 10 days, observe.    Vascular dementia without behavioral disturbance She resides in [facility], taking Donepezil 10mg  daily for memory, self transfer, toileting, ambulating  with walker.        Family/ staff Communication: plan of care reviewed with the patient and charge nurse.   Labs/tests ordered:  none  Time spend 25 minutes.

## 2018-04-30 NOTE — Assessment & Plan Note (Signed)
She resides in [facility], taking Donepezil 10mg  daily for memory, self transfer, toileting, ambulating with walker.

## 2018-04-30 NOTE — Assessment & Plan Note (Signed)
itching and sensitive redness in the left groin and lower abdominal skin fold, bright red with a few scattered satellite patterned red spots around the reddened area. Will apply Nystatin powder bid to affected area for 10 days, observe.

## 2018-05-21 ENCOUNTER — Encounter: Payer: Self-pay | Admitting: Nurse Practitioner

## 2018-05-21 ENCOUNTER — Non-Acute Institutional Stay: Payer: Medicare Other | Admitting: Nurse Practitioner

## 2018-05-21 DIAGNOSIS — I5030 Unspecified diastolic (congestive) heart failure: Secondary | ICD-10-CM | POA: Diagnosis not present

## 2018-05-21 DIAGNOSIS — F0150 Vascular dementia without behavioral disturbance: Secondary | ICD-10-CM

## 2018-05-21 DIAGNOSIS — I10 Essential (primary) hypertension: Secondary | ICD-10-CM

## 2018-05-21 DIAGNOSIS — E039 Hypothyroidism, unspecified: Secondary | ICD-10-CM | POA: Diagnosis not present

## 2018-05-21 NOTE — Assessment & Plan Note (Signed)
She resides in [facility], self transfer, ambulates with walker, continue  Donepezil 10mg  qd for memory.

## 2018-05-21 NOTE — Assessment & Plan Note (Signed)
blood pressure is controlled, continue Losartan qd.

## 2018-05-21 NOTE — Assessment & Plan Note (Addendum)
Compensated clinically. Echocardiogram 02/20/18- EF 65%, mild TR, mild pulmonary hypertension, grade 1 diastolic dysfunction

## 2018-05-21 NOTE — Assessment & Plan Note (Signed)
Hypothyroidism, continue Levothyroxine 87mcg qd, last TSH 3.18 02/24/18.

## 2018-05-21 NOTE — Progress Notes (Signed)
Location:  [place] Room Number: 802 Place of Service:  ALF [phone]) Provider:  Mast, Lennie Odor  NP  Blanchie Serve, MD  Patient Care Team: Blanchie Serve, MD as PCP - General (Internal Medicine) Milus Banister, MD as Consulting Physician (Gastroenterology) Heath Lark, MD as Consulting Physician (Hematology and Oncology) Mast, Man X, NP as Nurse Practitioner (Internal Medicine)  Extended Emergency Contact Information Primary Emergency Contact: [facility] Address: [address]          [place], [place] [address] [name] of [place] Phone: [phone] Work Phone: [phone] Mobile Phone: [phone] Relation: Son Secondary Emergency Contact: [name],[name] Address: [place] [address] [name] of [place] Phone: [phone] Relation: Daughter  Code Status:  DNR Goals of care: Advanced Directive information Advanced Directives 05/21/2018  Does Patient Have a Medical Advance Directive? Yes  Type of Advance Directive Out of facility DNR (pink MOST or yellow form)  Does patient want to make changes to medical advance directive? No - Patient declined  Copy of [place] in Chart? -  Would patient like information on creating a medical advance directive? -  Pre-existing out of facility DNR order (yellow form or pink MOST form) Yellow form placed in chart (order not valid for inpatient use)     Chief Complaint  Patient presents with  . Medical Management of Chronic Issues    F/U- HTN, dementia, skin rash    HPI:  Pt is a 84 y.o. female seen today for medical management of chronic diseases.     The patient has history of HTN, blood pressure is controlled on Losartan qd. Hypothyroidism, on Levothyroxine 12mcg qd, last TSH 3.18 02/24/18. She resides in [facility], self transfer, ambulates with walker, on Donepezil 10mg  qd for memory.  Past Medical History:  Diagnosis Date  . Blood transfusion  2006  . Colon polyps   . CVA (cerebral infarction) 2013  . Dementia   . Diverticulosis of colon   . DJD (degenerative joint disease)   . Hyperlipidemia   . Hypertension   . Hypothyroidism   . Melanoma ([place]) 03/20/15   removed with Moh's surgery  . Myelodysplasia 02/04/2012  . Normochromic normocytic anemia 02/04/2012  . Onychomycosis   . Osteoporosis   . Rectal prolapse   . Shoulder pain   . Unstable gait    Past Surgical History:  Procedure Laterality Date  . APPENDECTOMY    . CARPAL TUNNEL RELEASE Bilateral 2006  . FLEXIBLE SIGMOIDOSCOPY  07/09/2012   Procedure: FLEXIBLE SIGMOIDOSCOPY;  Surgeon: Inda Castle, MD;  Location: [facility];  Service: Endoscopy;  Laterality: N/A;  . MELANOMA EXCISION Right 2005   arm  . POLYPECTOMY  2004   Laparoscopic  . RECTOCELE REPAIR    . REPLACEMENT TOTAL KNEE  2005 & 2006  . VAGINAL HYSTERECTOMY  1989    Allergies  Allergen Reactions  . Lisinopril     REACTION: angioedema  . Penicillins     REACTION: rash    Outpatient Encounter Medications as of 05/21/2018  Medication Sig  . acetaminophen (TYLENOL) 500 MG tablet Take 500 mg by mouth every 8 (eight) hours as needed.   Marland Kitchen aspirin EC 81 MG tablet Take 81 mg by mouth daily.  . Calcium Carbonate-Vit D-Min (CALTRATE 600+D PLUS) 600-400 MG-UNIT per tablet Take 1 tablet by mouth daily.    . Cholecalciferol (VITAMIN D-3 PO) Take by mouth.  1,000mg  one daily  . cycloSPORINE (RESTASIS) 0.05 % ophthalmic emulsion Place 1 drop into both eyes 2 (two) times daily.    Marland Kitchen donepezil (ARICEPT) 10 MG tablet Take 10 mg by mouth daily.  . hydroxypropyl methylcellulose / hypromellose (ISOPTO TEARS / GONIOVISC) 2.5 % ophthalmic solution Place 1 drop into both eyes every 2 (two) hours as needed for dry eyes.  Marland Kitchen levothyroxine (SYNTHROID, LEVOTHROID) 88 MCG tablet TAKE 1 TABLET BY MOUTH ONCE DAILY  . losartan (COZAAR) 100 MG tablet TAKE 1 TABLET EVERY DAY FOR BLOOD PRESSURE  . Multiple Vitamin (MULTIVITAMIN  WITH MINERALS) TABS Take 1 tablet by mouth daily.  . polyethylene glycol powder (GLYCOLAX/MIRALAX) powder Take 17 g by mouth daily as needed.  . vitamin B-12 (CYANOCOBALAMIN) 1000 MCG tablet Take 1,000 mcg by mouth daily.   No facility-administered encounter medications on file as of 05/21/2018.    ROS was provided with assistance of stff Review of Systems  Constitutional: Negative for activity change, appetite change, chills, diaphoresis, fatigue and fever.  HENT: Positive for hearing loss. Negative for congestion, trouble swallowing and voice change.   Respiratory: Negative for cough, shortness of breath and wheezing.   Cardiovascular: Positive for leg swelling. Negative for chest pain and palpitations.  Gastrointestinal: Negative for abdominal distention, abdominal pain, constipation, diarrhea and nausea.  Genitourinary: Negative for difficulty urinating, dysuria and urgency.  Musculoskeletal: Positive for arthralgias and gait problem.  Skin: Negative for color change and pallor.  Neurological: Negative for dizziness, speech difficulty, weakness and headaches.       Dementia  Psychiatric/Behavioral: Positive for confusion. Negative for agitation, behavioral problems, hallucinations and sleep disturbance. The patient is not nervous/anxious.     Immunization History  Administered Date(s) Administered  . Influenza Split 10/17/2011, 09/14/2012  . Influenza Whole 10/17/2007  . Influenza, High Dose Seasonal PF 10/11/2013, 10/05/2014  . Influenza-Unspecified 09/05/2015, 10/02/2016, 10/06/2017  . PPD Test 01/12/2015  . Pneumococcal Conjugate-13 09/29/2014  . Pneumococcal Polysaccharide-23 12/16/1998  . Tdap 04/23/2011  . Zoster 07/04/2006   Pertinent  Health Maintenance Due  Topic Date Due  . OPHTHALMOLOGY EXAM  11/15/2014  . FOOT EXAM  11/30/2014  . HEMOGLOBIN A1C  10/11/2015  . INFLUENZA VACCINE  07/16/2018  . DEXA SCAN  Completed  . PNA vac Low Risk Adult  Completed   Fall Risk   09/02/2017 07/04/2016 01/04/2016 04/06/2015 09/29/2014  Falls in the past year? No No No No No   Functional Status Survey:    Vitals:   05/21/18 0942  BP: 128/64  Pulse: 72  Resp: 16  Temp: 97.8 F (36.6 C)  Weight: 155 lb 3.2 oz (70.4 kg)  Height: 4\' 11"  (1.499 m)   Body mass index is 31.35 kg/m. Physical Exam  Constitutional: She appears well-developed and well-nourished.  HENT:  Head: Normocephalic and atraumatic.  Eyes: Pupils are equal, round, and reactive to light. EOM are normal.  Neck: Normal range of motion. Neck supple. No JVD present. No thyromegaly present.  Cardiovascular: Normal rate and regular rhythm.  Murmur heard. Pulmonary/Chest: Effort normal. She has no wheezes. She has no rales.  Abdominal: Soft. Bowel sounds are normal. She exhibits no distension. There is no tenderness.  Musculoskeletal: She exhibits edema.  Trace edema BLE. Ambulates with walker.   Neurological: She is alert. No cranial nerve deficit. She exhibits normal muscle tone. Coordination normal.  Oriented to person and place.   Skin: Skin is warm and dry.  Psychiatric: She has a normal mood and affect. Her  behavior is normal.    Labs reviewed: Recent Labs    12/18/17  NA 140  K 5.0  BUN 25*  CREATININE 1.1   No results for input(s): AST, ALT, ALKPHOS, BILITOT, PROT, ALBUMIN in the last 8760 hours. Recent Labs    08/12/17 02/24/18  WBC 8.1 8.6  HGB 10.0* 11.0*  HCT 30* 33*  PLT 395 489*   Lab Results  Component Value Date   TSH 3.18 02/24/2018   Lab Results  Component Value Date   HGBA1C 5.6 04/11/2015   Lab Results  Component Value Date   CHOL 147 04/22/2017   HDL 47 04/22/2017   LDLCALC 77 04/22/2017   LDLDIRECT 131.2 11/30/2013   TRIG 125 04/22/2017   CHOLHDL 4 11/30/2013    Significant Diagnostic Results in last 30 days:  No results found.  Assessment/Plan Essential hypertension blood pressure is controlled, continue Losartan qd.    Hypothyroidism Hypothyroidism, continue Levothyroxine 44mcg qd, last TSH 3.18 02/24/18.   Vascular dementia without behavioral disturbance She resides in [facility], self transfer, ambulates with walker, continue  Donepezil 10mg  qd for memory.    Diastolic CHF ([place]) Compensated clinically. Echocardiogram 02/20/18- EF 65%, mild TR, mild pulmonary hypertension, grade 1 diastolic dysfunction      Family/ staff Communication: plan of care reviewed with the patient and charge nurse.   Labs/tests ordered:  none  Time spend 25 minutes.

## 2018-05-22 ENCOUNTER — Encounter: Payer: Self-pay | Admitting: Nurse Practitioner

## 2018-05-25 ENCOUNTER — Telehealth: Payer: Self-pay

## 2018-05-25 NOTE — Telephone Encounter (Signed)
Patient's son, Yasheka Fossett, called [facility] to scheudle an AWV for his mother. I called him and told him I saw her on 09/02/2017 and will see her after that date this year. There was no answer so I left a vm and a callback number if he had any questions.

## 2018-06-26 ENCOUNTER — Encounter: Payer: Self-pay | Admitting: Internal Medicine

## 2018-06-26 DIAGNOSIS — L570 Actinic keratosis: Secondary | ICD-10-CM | POA: Insufficient documentation

## 2018-07-28 ENCOUNTER — Encounter: Payer: Self-pay | Admitting: Nurse Practitioner

## 2018-07-28 ENCOUNTER — Non-Acute Institutional Stay: Payer: Medicare Other | Admitting: Nurse Practitioner

## 2018-07-28 DIAGNOSIS — R5383 Other fatigue: Secondary | ICD-10-CM

## 2018-07-28 DIAGNOSIS — R634 Abnormal weight loss: Secondary | ICD-10-CM | POA: Diagnosis not present

## 2018-07-28 DIAGNOSIS — D509 Iron deficiency anemia, unspecified: Secondary | ICD-10-CM

## 2018-07-28 DIAGNOSIS — F0150 Vascular dementia without behavioral disturbance: Secondary | ICD-10-CM | POA: Diagnosis not present

## 2018-07-28 DIAGNOSIS — D469 Myelodysplastic syndrome, unspecified: Secondary | ICD-10-CM

## 2018-07-28 DIAGNOSIS — I10 Essential (primary) hypertension: Secondary | ICD-10-CM

## 2018-07-28 DIAGNOSIS — E039 Hypothyroidism, unspecified: Secondary | ICD-10-CM

## 2018-07-28 NOTE — Assessment & Plan Note (Addendum)
Will update CBC, continue Vit B12. F/u Oncology.

## 2018-07-28 NOTE — Assessment & Plan Note (Signed)
Continue [facility] for care assistance, ambulates with walker. Continue Donepezil for memory.

## 2018-07-28 NOTE — Assessment & Plan Note (Signed)
Weights May #156Ibs, Jun #155 Ibs, July # 155Ibs, Aug # 153 Ibs, will weigh the patient weekly x 4 to monitor.

## 2018-07-28 NOTE — Assessment & Plan Note (Signed)
Blood pressure is controlled, continue Cozaar 100mg  qd.

## 2018-07-28 NOTE — Assessment & Plan Note (Signed)
Will update CBC/diff, CMP, observe.

## 2018-07-28 NOTE — Progress Notes (Addendum)
Location:  [place] Room Number: 802 Place of Service:  AL[facility] [phone]) Provider:  Monica Codd, Lennie Odor  NP  Blanchie Serve, MD  Patient Care Team: Blanchie Serve, MD as PCP - General (Internal Medicine) Milus Banister, MD as Consulting Physician (Gastroenterology) Heath Lark, MD as Consulting Physician (Hematology and Oncology) Limuel Nieblas X, NP as Nurse Practitioner (Internal Medicine)  Extended Emergency Contact Information Primary Emergency Contact: [facility] Address: [address]          [place], [place] [address] [name] of [place] Phone: [phone] Work Phone: [phone] Mobile Phone: [phone] Relation: Son Secondary Emergency Contact: [name],[name] Address: [place] [address] [name] of [place] Phone: [phone] Relation: Daughter  Code Status:  DNR Goals of care: Advanced Directive information Advanced Directives 07/28/2018  Does Patient Have a Medical Advance Directive? Yes  Type of Advance Directive Out of facility DNR (pink MOST or yellow form)  Does patient want to make changes to medical advance directive? No - Patient declined  Copy of [place] in Chart? -  Would patient like information on creating a medical advance directive? -  Pre-existing out of facility DNR order (yellow form or pink MOST form) Yellow form placed in chart (order not valid for inpatient use)     Chief Complaint  Patient presents with  . Acute Visit    weight loss, fatigue, insomnia    HPI:  Pt is a 84 y.o. female seen today for an acute visit for fatigue, reported the patient seems sleeping between meals more than usual, her weight dropped #2Ibs, #155Ibs July 2019, # 153Ibs aug 2019. She denied pain, cough, SOB, palpitation, nausea, vomiting, constipation, or diarrhea. She is afebrile, no O2 desaturation. Hx of dementia, resides in [facility], ambulates with walker, on Donepezil for memory. Hx of  HTN, blood pressure is controlled on Cozaar 100mg  qd. Hx of myelodysplastic syndrome, dc'd from oncology except f/u if Hgb is <9.0, on Vit B12.    Past Medical History:  Diagnosis Date  . Blood transfusion 2006  . Colon polyps   . CVA (cerebral infarction) 2013  . Dementia   . Diverticulosis of colon   . DJD (degenerative joint disease)   . Hyperlipidemia   . Hypertension   . Hypothyroidism   . Melanoma ([place]) 03/20/15   removed with Moh's surgery  . Myelodysplasia 02/04/2012  . Normochromic normocytic anemia 02/04/2012  . Onychomycosis   . Osteoporosis   . Rectal prolapse   . Shoulder pain   . Unstable gait    Past Surgical History:  Procedure Laterality Date  . APPENDECTOMY    . CARPAL TUNNEL RELEASE Bilateral 2006  . FLEXIBLE SIGMOIDOSCOPY  07/09/2012   Procedure: FLEXIBLE SIGMOIDOSCOPY;  Surgeon: Inda Castle, MD;  Location: [facility];  Service: Endoscopy;  Laterality: N/A;  . MELANOMA EXCISION Right 2005   arm  . POLYPECTOMY  2004   Laparoscopic  . RECTOCELE REPAIR    . REPLACEMENT TOTAL KNEE  2005 & 2006  . VAGINAL HYSTERECTOMY  1989    Allergies  Allergen Reactions  . Lisinopril     REACTION: angioedema  . Penicillins     REACTION: rash    Outpatient Encounter Medications as of 07/28/2018  Medication Sig  . acetaminophen (TYLENOL) 500 MG tablet Take 500 mg by mouth every 8 (eight) hours as needed.   Marland Kitchen acetaminophen (TYLENOL) 500 MG tablet  Take 500 mg by mouth every 12 (twelve) hours. 8:00 am and 8:00 pm  . aspirin EC 81 MG tablet Take 81 mg by mouth daily.  . Calcium Carbonate-Vit D-Min (CALTRATE 600+D PLUS) 600-400 MG-UNIT per tablet Take 1 tablet by mouth daily.    . Cholecalciferol (VITAMIN D-3 PO) Take by mouth. 1,000mg  one daily  . cycloSPORINE (RESTASIS) 0.05 % ophthalmic emulsion Place 1 drop into both eyes 2 (two) times daily.    Marland Kitchen donepezil (ARICEPT) 10 MG tablet Take 10 mg by mouth daily.  . hydroxypropyl methylcellulose / hypromellose (ISOPTO  TEARS / GONIOVISC) 2.5 % ophthalmic solution Place 1 drop into both eyes every 2 (two) hours as needed for dry eyes.  Marland Kitchen levothyroxine (SYNTHROID, LEVOTHROID) 88 MCG tablet TAKE 1 TABLET BY MOUTH ONCE DAILY  . losartan (COZAAR) 100 MG tablet TAKE 1 TABLET EVERY DAY FOR BLOOD PRESSURE  . Multiple Vitamin (MULTIVITAMIN WITH MINERALS) TABS Take 1 tablet by mouth daily.  . polyethylene glycol powder (GLYCOLAX/MIRALAX) powder Take 17 g by mouth daily as needed.  . vitamin B-12 (CYANOCOBALAMIN) 1000 MCG tablet Take 1,000 mcg by mouth daily.   No facility-administered encounter medications on file as of 07/28/2018.    ROS was provided with assistance of staff Review of Systems  Constitutional: Positive for activity change, appetite change and fatigue. Negative for chills, diaphoresis and fever.  HENT: Positive for hearing loss. Negative for congestion and voice change.   Respiratory: Negative for cough, shortness of breath and wheezing.   Cardiovascular: Positive for leg swelling. Negative for chest pain and palpitations.  Gastrointestinal: Negative for abdominal distention, abdominal pain, constipation, diarrhea, nausea and vomiting.  Genitourinary: Negative for difficulty urinating, dysuria and urgency.  Musculoskeletal: Positive for arthralgias and gait problem.  Skin: Negative for color change and pallor.  Neurological: Negative for dizziness, speech difficulty, weakness and headaches.       Dementia  Psychiatric/Behavioral: Positive for confusion. Negative for agitation, behavioral problems and sleep disturbance. The patient is not nervous/anxious.     Immunization History  Administered Date(s) Administered  . Influenza Split 10/17/2011, 09/14/2012  . Influenza Whole 10/17/2007  . Influenza, High Dose Seasonal PF 10/11/2013, 10/05/2014  . Influenza-Unspecified 09/05/2015, 10/02/2016, 10/06/2017  . PPD Test 01/12/2015  . Pneumococcal Conjugate-13 09/29/2014  . Pneumococcal  Polysaccharide-23 12/16/1998  . Tdap 04/23/2011  . Zoster 07/04/2006   Pertinent  Health Maintenance Due  Topic Date Due  . OPHTHALMOLOGY EXAM  11/15/2014  . FOOT EXAM  11/30/2014  . HEMOGLOBIN A1C  10/11/2015  . INFLUENZA VACCINE  07/16/2018  . DEXA SCAN  Completed  . PNA vac Low Risk Adult  Completed   Fall Risk  09/02/2017 07/04/2016 01/04/2016 04/06/2015 09/29/2014  Falls in the past year? No No No No No   Functional Status Survey:    Vitals:   07/28/18 1152  BP: 120/60  Pulse: 68  Resp: 18  Temp: (!) 97.4 F (36.3 C)  SpO2: 91%  Weight: 153 lb (69.4 kg)  Height: 4\' 11"  (1.499 m)   Body mass index is 30.9 kg/m. Physical Exam  Constitutional: She appears well-developed and well-nourished.  HENT:  Head: Normocephalic and atraumatic.  Eyes: Pupils are equal, round, and reactive to light. EOM are normal.  Neck: Normal range of motion. Neck supple. No JVD present. No thyromegaly present.  Cardiovascular: Normal rate and regular rhythm.  Murmur heard. Pulmonary/Chest: She has no wheezes. She has no rales.  Abdominal: Soft. Bowel sounds are normal. She exhibits no distension.  Musculoskeletal: She exhibits edema.  Trace edema BLE, self transfer, ambulates with walker.   Neurological: She is alert. No cranial nerve deficit. She exhibits normal muscle tone. Coordination normal.  Oriented to person and her room on unit.   Skin: Skin is warm and dry.  Psychiatric: She has a normal mood and affect. Her behavior is normal.    Labs reviewed: Recent Labs    12/18/17 07/30/18 08/04/18  NA 140 138 141  K 5.0 5.4* 5.0  CL  --  106 106  CO2  --  21 9.7  BUN 25* 42* 28*  CREATININE 1.1 1.2* 1.2*  CALCIUM  --  9.7  --    Recent Labs    07/30/18 08/04/18  AST 29 14  ALT 40* 21  ALKPHOS 114 92  PROT 6.2 6.7  ALBUMIN 3.7 4.0   Recent Labs    02/24/18 07/30/18 08/04/18  WBC 8.6 8.8 9.4  HGB 11.0* 8.7* 8.9*  HCT 33* 27* 26*  PLT 489* 649* 776*   Lab Results    Component Value Date   TSH 3.18 02/24/2018   Lab Results  Component Value Date   HGBA1C 5.6 04/11/2015   Lab Results  Component Value Date   CHOL 147 04/22/2017   HDL 47 04/22/2017   LDLCALC 77 04/22/2017   LDLDIRECT 131.2 11/30/2013   TRIG 125 04/22/2017   CHOLHDL 4 11/30/2013    Significant Diagnostic Results in last 30 days:  No results found.  Assessment/Plan Myelodysplastic syndrome ([place]) Will update CBC, continue Vit B12. F/u Oncology.   Fatigue Will update CBC/diff, CMP, observe.   Weight loss Weights May #156Ibs, Jun #155 Ibs, July # 155Ibs, Aug # 153 Ibs, will weigh the patient weekly x 4 to monitor.   Vascular dementia without behavioral disturbance Continue [facility] for care assistance, ambulates with walker. Continue Donepezil for memory.   Essential hypertension Blood pressure is controlled, continue Cozaar 100mg  qd.   Anemia, iron deficiency 02/24/18 wbc 8.6, Hgb 11.0, plt 489, Vit B12 >2000 08/04/18 wbc 9.4, Hgb 8.9, plt 776, ab mono 1664, Na 141, K 5.0, Bun 28, creat 1.20, Iron 26(45-160), Fe sat 10(16-45), ferritin 1268 08/05/18 adding Fe 325mg  qd after Guaiac stools, check FOBT x3, repeat CBC/diff, ferritin level, TSH(in setting of fatigue), Folate, Vit B12 one week. F/u Oncology Dr. Alvy Bimler. Currently she is taking Vit B12 1071mcg qd.  08/05/18 adding Fe 325mg  qd, check FOBT x3, repeat CBC/diff, Vit B12, Folate, TSH, ferritin level one week. Vit B12 >2000, Folate 1199,  dc Vit B12 1079mcg qd. Repeat Vit B12, Folate, CBC in 2 weeks  Hypothyroidism Continue Levothyroxine 75mcg qd, update TSH in setting of fatigue.      Family/ staff Communication: plan of care reviewed with the patient and charge nurse.   Labs/tests ordered:  CBC/diff, CMP, TSH, Vit B12, Folate, Ferritin,  Guaiac stools x3  Time spend 25 minutes.

## 2018-07-29 ENCOUNTER — Encounter: Payer: Self-pay | Admitting: Nurse Practitioner

## 2018-07-29 ENCOUNTER — Encounter: Payer: Self-pay | Admitting: Internal Medicine

## 2018-07-30 ENCOUNTER — Other Ambulatory Visit: Payer: Self-pay | Admitting: *Deleted

## 2018-07-30 LAB — HEPATIC FUNCTION PANEL
ALT: 40 — AB (ref 7–35)
AST: 29 (ref 13–35)
Alkaline Phosphatase: 114 (ref 25–125)
BILIRUBIN, TOTAL: 0.5

## 2018-07-30 LAB — COMPLETE METABOLIC PANEL WITH GFR
Albumin: 3.7
CO2: 21
Calcium: 9.7
Chloride: 106
EGFR (Non-African Amer.): 41
GLOBULIN: 2.5
Total Protein: 6.2 g/dL

## 2018-07-30 LAB — BASIC METABOLIC PANEL
BUN: 42 — AB (ref 4–21)
Creatinine: 1.2 — AB (ref <=1.1)
GLUCOSE: 100
Potassium: 5.4 — AB (ref 3.4–5.3)
SODIUM: 138 (ref 137–147)

## 2018-07-30 LAB — CBC AND DIFFERENTIAL
HCT: 27 — AB (ref 36–46)
Hemoglobin: 8.7 — AB (ref 12.0–16.0)
PLATELETS: 649 — AB (ref 150–399)
WBC: 8.8

## 2018-08-04 ENCOUNTER — Telehealth: Payer: Self-pay

## 2018-08-04 ENCOUNTER — Other Ambulatory Visit: Payer: Self-pay | Admitting: *Deleted

## 2018-08-04 LAB — COMPLETE METABOLIC PANEL WITH GFR
ALBUMIN: 4
CHLORIDE: 106
Carbon Dioxide, Total: 9.7
EGFR (Non-African Amer.): 39
Globulin: 2.7
RETIC CT ABS: 64400
RETICULOCYTE COUNT: 2
TOTAL PROTEIN: 6.7 g/dL

## 2018-08-04 LAB — HEPATIC FUNCTION PANEL
ALK PHOS: 92 (ref 25–125)
ALT: 21 (ref 7–35)
AST: 14 (ref 13–35)
Bilirubin, Total: 0.5

## 2018-08-04 LAB — BASIC METABOLIC PANEL
BUN: 28 — AB (ref 4–21)
CREATININE: 1.2 — AB (ref <=1.1)
Glucose: 114
Potassium: 5 (ref 3.4–5.3)
Sodium: 141 (ref 137–147)

## 2018-08-04 LAB — IRON,TIBC AND FERRITIN PANEL
FERRITIN: 1268
Iron: 26

## 2018-08-04 LAB — CBC AND DIFFERENTIAL
HCT: 26 — AB (ref 36–46)
Hemoglobin: 8.9 — AB (ref 12.0–16.0)
Platelets: 776 — AB (ref 150–399)
WBC: 9.4

## 2018-08-04 NOTE — Telephone Encounter (Signed)
Nurse at [facility] called and left a message. She needs to make appt for patient. She has a order to call and schedule follow up with Dr. Alvy Bimler.

## 2018-08-04 NOTE — Telephone Encounter (Signed)
pls send scheduling msg for labs and see her next week, 30 mins

## 2018-08-05 ENCOUNTER — Other Ambulatory Visit: Payer: Self-pay | Admitting: *Deleted

## 2018-08-05 ENCOUNTER — Encounter: Payer: Self-pay | Admitting: Nurse Practitioner

## 2018-08-05 NOTE — Assessment & Plan Note (Addendum)
02/24/18 wbc 8.6, Hgb 11.0, plt 489, Vit B12 >2000 08/04/18 wbc 9.4, Hgb 8.9, plt 776, ab mono 1664, Na 141, K 5.0, Bun 28, creat 1.20, Iron 26(45-160), Fe sat 10(16-45), ferritin 1268 08/05/18 adding Fe 325mg  qd after Guaiac stools, check FOBT x3, repeat CBC/diff, ferritin level, TSH(in setting of fatigue), Folate, Vit B12 one week. F/u Oncology Dr. Alvy Bimler. Currently she is taking Vit B12 1084mcg qd.  08/05/18 adding Fe 325mg  qd, check FOBT x3, repeat CBC/diff, Vit B12, Folate, TSH, ferritin level one week. Vit B12 >2000, Folate 1199,  dc Vit B12 1056mcg qd. Repeat Vit B12, Folate, CBC in 2 weeks

## 2018-08-05 NOTE — Assessment & Plan Note (Signed)
Continue Levothyroxine 13mcg qd, update TSH in setting of fatigue.

## 2018-08-06 ENCOUNTER — Telehealth: Payer: Self-pay | Admitting: Hematology and Oncology

## 2018-08-06 ENCOUNTER — Other Ambulatory Visit: Payer: Self-pay | Admitting: *Deleted

## 2018-08-06 LAB — FOLATE RBC
FOLATE, RBC: 1199
VITAMIN B12: >2000

## 2018-08-06 NOTE — Telephone Encounter (Signed)
Spoke to PT caregiving office regarding upcoming aug appts per 8/22 sch message

## 2018-08-10 ENCOUNTER — Other Ambulatory Visit: Payer: Self-pay | Admitting: Hematology and Oncology

## 2018-08-10 DIAGNOSIS — D469 Myelodysplastic syndrome, unspecified: Secondary | ICD-10-CM

## 2018-08-11 ENCOUNTER — Inpatient Hospital Stay: Payer: Medicare Other | Attending: Hematology and Oncology | Admitting: Hematology and Oncology

## 2018-08-11 ENCOUNTER — Inpatient Hospital Stay: Payer: Medicare Other

## 2018-08-11 LAB — CBC AND DIFFERENTIAL
HEMATOCRIT: 28 — AB (ref 36–46)
HEMOGLOBIN: 9.1 — AB (ref 12.0–16.0)
PLATELETS: 706 — AB (ref 150–399)
WBC: 15.9

## 2018-08-11 LAB — IRON,TIBC AND FERRITIN PANEL: Ferritin: 946

## 2018-08-11 LAB — VITAMIN B12: Vitamin B-12: 2000

## 2018-08-11 LAB — TSH: TSH: 1.88 (ref <=5.90)

## 2018-08-12 ENCOUNTER — Telehealth: Payer: Self-pay

## 2018-08-12 NOTE — Telephone Encounter (Signed)
-----   Message from Heath Lark, MD sent at 08/12/2018  1:24 PM EDT ----- Regarding: No show yesterday She is in a NH, no show yesterday Can you call the NH nurses? Do we need to reschedule?

## 2018-08-12 NOTE — Telephone Encounter (Signed)
Called and spoke with Rise Paganini at [facility], gave her below message.  She will call back to the office. She will see what happened with the appt yesterday.

## 2018-08-13 ENCOUNTER — Telehealth: Payer: Self-pay | Admitting: Hematology and Oncology

## 2018-08-13 ENCOUNTER — Other Ambulatory Visit: Payer: Self-pay | Admitting: *Deleted

## 2018-08-13 ENCOUNTER — Telehealth: Payer: Self-pay | Admitting: *Deleted

## 2018-08-13 LAB — FOLATE RBC: Folate, RBC: >24

## 2018-08-13 NOTE — Telephone Encounter (Signed)
Need labs, 30 mins visit and inj appt

## 2018-08-13 NOTE — Telephone Encounter (Signed)
Spoke to pt's nursing facility to r/s appt per vm left my nurse.

## 2018-08-13 NOTE — Telephone Encounter (Signed)
TC from [place] @ [facility] requesting to reschedule pt's appt with Dr. Alvy Bimler. They were unaware of appt that was scheduled for yesterday.  Scheduling message sent

## 2018-08-14 ENCOUNTER — Encounter (HOSPITAL_COMMUNITY): Payer: Self-pay | Admitting: Emergency Medicine

## 2018-08-14 ENCOUNTER — Inpatient Hospital Stay (HOSPITAL_COMMUNITY)
Admission: EM | Admit: 2018-08-14 | Discharge: 2018-08-18 | DRG: 871 | Disposition: A | Discharge status: Skilled Nursing Facility | Payer: Medicare Other | Attending: Internal Medicine | Admitting: Internal Medicine

## 2018-08-14 ENCOUNTER — Emergency Department (HOSPITAL_COMMUNITY): Payer: Medicare Other

## 2018-08-14 DIAGNOSIS — Z7982 Long term (current) use of aspirin: Secondary | ICD-10-CM

## 2018-08-14 DIAGNOSIS — D509 Iron deficiency anemia, unspecified: Secondary | ICD-10-CM | POA: Diagnosis present

## 2018-08-14 DIAGNOSIS — D631 Anemia in chronic kidney disease: Secondary | ICD-10-CM | POA: Diagnosis present

## 2018-08-14 DIAGNOSIS — Z96659 Presence of unspecified artificial knee joint: Secondary | ICD-10-CM | POA: Diagnosis present

## 2018-08-14 DIAGNOSIS — R402363 Coma scale, best motor response, obeys commands, at hospital admission: Secondary | ICD-10-CM | POA: Diagnosis present

## 2018-08-14 DIAGNOSIS — R402143 Coma scale, eyes open, spontaneous, at hospital admission: Secondary | ICD-10-CM | POA: Diagnosis present

## 2018-08-14 DIAGNOSIS — J690 Pneumonitis due to inhalation of food and vomit: Secondary | ICD-10-CM | POA: Diagnosis present

## 2018-08-14 DIAGNOSIS — M810 Age-related osteoporosis without current pathological fracture: Secondary | ICD-10-CM | POA: Diagnosis present

## 2018-08-14 DIAGNOSIS — M1990 Unspecified osteoarthritis, unspecified site: Secondary | ICD-10-CM | POA: Diagnosis present

## 2018-08-14 DIAGNOSIS — N179 Acute kidney failure, unspecified: Secondary | ICD-10-CM

## 2018-08-14 DIAGNOSIS — A4151 Sepsis due to Escherichia coli [E. coli]: Principal | ICD-10-CM | POA: Diagnosis present

## 2018-08-14 DIAGNOSIS — I5030 Unspecified diastolic (congestive) heart failure: Secondary | ICD-10-CM | POA: Diagnosis present

## 2018-08-14 DIAGNOSIS — R402253 Coma scale, best verbal response, oriented, at hospital admission: Secondary | ICD-10-CM | POA: Diagnosis present

## 2018-08-14 DIAGNOSIS — N3001 Acute cystitis with hematuria: Secondary | ICD-10-CM

## 2018-08-14 DIAGNOSIS — Z66 Do not resuscitate: Secondary | ICD-10-CM | POA: Diagnosis present

## 2018-08-14 DIAGNOSIS — Z888 Allergy status to other drugs, medicaments and biological substances status: Secondary | ICD-10-CM | POA: Diagnosis not present

## 2018-08-14 DIAGNOSIS — D72829 Elevated white blood cell count, unspecified: Secondary | ICD-10-CM | POA: Diagnosis not present

## 2018-08-14 DIAGNOSIS — N183 Chronic kidney disease, stage 3 unspecified: Secondary | ICD-10-CM | POA: Diagnosis present

## 2018-08-14 DIAGNOSIS — Z79899 Other long term (current) drug therapy: Secondary | ICD-10-CM

## 2018-08-14 DIAGNOSIS — Z880 Allergy status to penicillin: Secondary | ICD-10-CM

## 2018-08-14 DIAGNOSIS — F0150 Vascular dementia without behavioral disturbance: Secondary | ICD-10-CM | POA: Diagnosis present

## 2018-08-14 DIAGNOSIS — A419 Sepsis, unspecified organism: Secondary | ICD-10-CM

## 2018-08-14 DIAGNOSIS — I10 Essential (primary) hypertension: Secondary | ICD-10-CM | POA: Diagnosis present

## 2018-08-14 DIAGNOSIS — D469 Myelodysplastic syndrome, unspecified: Secondary | ICD-10-CM | POA: Diagnosis present

## 2018-08-14 DIAGNOSIS — N390 Urinary tract infection, site not specified: Secondary | ICD-10-CM | POA: Diagnosis present

## 2018-08-14 DIAGNOSIS — E039 Hypothyroidism, unspecified: Secondary | ICD-10-CM | POA: Diagnosis present

## 2018-08-14 DIAGNOSIS — Z8673 Personal history of transient ischemic attack (TIA), and cerebral infarction without residual deficits: Secondary | ICD-10-CM | POA: Diagnosis not present

## 2018-08-14 DIAGNOSIS — I130 Hypertensive heart and chronic kidney disease with heart failure and stage 1 through stage 4 chronic kidney disease, or unspecified chronic kidney disease: Secondary | ICD-10-CM | POA: Diagnosis present

## 2018-08-14 DIAGNOSIS — Z86010 Personal history of colonic polyps: Secondary | ICD-10-CM

## 2018-08-14 DIAGNOSIS — Z1623 Resistance to quinolones and fluoroquinolones: Secondary | ICD-10-CM | POA: Diagnosis present

## 2018-08-14 DIAGNOSIS — Z7189 Other specified counseling: Secondary | ICD-10-CM

## 2018-08-14 DIAGNOSIS — I639 Cerebral infarction, unspecified: Secondary | ICD-10-CM | POA: Diagnosis present

## 2018-08-14 LAB — URINALYSIS, ROUTINE W REFLEX MICROSCOPIC
Bilirubin Urine: NEGATIVE
Glucose, UA: NEGATIVE mg/dL
Ketones, ur: NEGATIVE mg/dL
Nitrite: NEGATIVE
PROTEIN: 100 mg/dL — AB
Specific Gravity, Urine: 1.012 (ref 1.005–1.030)
WBC, UA: >50 WBC/hpf — ABNORMAL HIGH (ref 0–5)
pH: 6 (ref 5.0–8.0)

## 2018-08-14 LAB — CBC WITH DIFFERENTIAL/PLATELET
BAND NEUTROPHILS: 0 %
BASOS ABS: 0.2 10*3/uL — AB (ref 0.0–0.1)
BASOS PCT: 1 %
Blasts: 0 %
EOS PCT: 0 %
Eosinophils Absolute: 0 10*3/uL (ref 0.0–0.7)
HEMATOCRIT: 26.1 % — AB (ref 36.0–46.0)
Hemoglobin: 8.6 g/dL — ABNORMAL LOW (ref 12.0–15.0)
LYMPHS ABS: 0.8 10*3/uL (ref 0.7–4.0)
LYMPHS PCT: 4 %
MCH: 27.8 pg (ref 26.0–34.0)
MCHC: 33 g/dL (ref 30.0–36.0)
MCV: 84.5 fL (ref 78.0–100.0)
METAMYELOCYTES PCT: 0 %
MONO ABS: 3.9 10*3/uL — AB (ref 0.1–1.0)
Monocytes Relative: 20 %
Myelocytes: 0 %
NEUTROS ABS: 14.7 10*3/uL — AB (ref 1.7–7.7)
NRBC: 0 /100{WBCs}
Neutrophils Relative %: 75 %
PLATELETS: 572 10*3/uL — AB (ref 150–400)
Promyelocytes Relative: 0 %
RBC: 3.09 MIL/uL — ABNORMAL LOW (ref 3.87–5.11)
RDW: 17.2 % — AB (ref 11.5–15.5)
WBC: 19.6 10*3/uL — ABNORMAL HIGH (ref 4.0–10.5)

## 2018-08-14 LAB — COMPREHENSIVE METABOLIC PANEL
ALBUMIN: 3.7 g/dL (ref 3.5–5.0)
ALK PHOS: 98 U/L (ref 38–126)
ALT: 23 U/L (ref 0–44)
AST: 23 U/L (ref 15–41)
Anion gap: 14 (ref 5–15)
BUN: 29 mg/dL — AB (ref 8–23)
CALCIUM: 10.2 mg/dL (ref 8.9–10.3)
CHLORIDE: 109 mmol/L (ref 98–111)
CO2: 21 mmol/L — ABNORMAL LOW (ref 22–32)
CREATININE: 1.56 mg/dL — AB (ref 0.44–1.00)
GFR calc non Af Amer: 28 mL/min — ABNORMAL LOW (ref >60)
GFR, EST AFRICAN AMERICAN: 32 mL/min — AB (ref >60)
GLUCOSE: 152 mg/dL — AB (ref 70–99)
Potassium: 4.9 mmol/L (ref 3.5–5.1)
SODIUM: 144 mmol/L (ref 135–145)
Total Bilirubin: 0.8 mg/dL (ref 0.3–1.2)
Total Protein: 7 g/dL (ref 6.5–8.1)

## 2018-08-14 LAB — PROTIME-INR
INR: 1.1
PROTHROMBIN TIME: 14.1 s (ref 11.4–15.2)

## 2018-08-14 LAB — I-STAT CG4 LACTIC ACID, ED
LACTIC ACID, VENOUS: 1.05 mmol/L (ref 0.5–1.9)
Lactic Acid, Venous: 2.49 mmol/L (ref 0.5–1.9)

## 2018-08-14 MED ORDER — LACTATED RINGERS IV BOLUS
1000.0000 mL | Freq: Once | INTRAVENOUS | Status: AC
  Administered 2018-08-14: 1000 mL via INTRAVENOUS

## 2018-08-14 MED ORDER — SODIUM CHLORIDE 0.9 % IV SOLN
2.0000 g | Freq: Once | INTRAVENOUS | Status: AC
  Administered 2018-08-14: 2 g via INTRAVENOUS
  Filled 2018-08-14: qty 2

## 2018-08-14 MED ORDER — VANCOMYCIN HCL IN DEXTROSE 1-5 GM/200ML-% IV SOLN
1000.0000 mg | Freq: Once | INTRAVENOUS | Status: AC
  Administered 2018-08-14: 1000 mg via INTRAVENOUS
  Filled 2018-08-14: qty 200

## 2018-08-14 MED ORDER — METRONIDAZOLE IN NACL 5-0.79 MG/ML-% IV SOLN
500.0000 mg | Freq: Three times a day (TID) | INTRAVENOUS | Status: DC
  Administered 2018-08-14: 500 mg via INTRAVENOUS
  Filled 2018-08-14: qty 100

## 2018-08-14 NOTE — H&P (Signed)
History and Physical    Brittany [name] IEP:[id number] DOB: 03-Dec-1924 DOA: 08/14/2018  PCP: Blanchie Serve, MD Patient coming from: Facility  Chief Complaint: Emesis weakness per ED.  HPI: Brittany [name] is a 84 y.o. female with medical history significant of vascular dementia, CVA, myelodysplastic syndrome, hypothyroidism, hypertension, CKD 3, iron deficiency anemia,HFpEF who presented from facility with generalized weakness and vomiting.  Patient is alert and awake but oriented only to self.  Per ED note,  "According to EMS the patient had one episode of nonbloody nonbilious vomiting at the facility which she resides.  She is also found to be weak there so they called EMS.  On EMS arrival patient was sleepier than per her baseline but was alert and oriented per her baseline.  She had a fever with down to 103.  No interventions prior to arrival here.  Patient denies cough but also does not remember vomiting or know where she is at right now"  In ED, vital signs significant for fever to 104 and tachycardia to 124.  CMP not impressive except for elevated creatinine to 1.56 (baseline 1.2).  CBC with leukocytosis to 19.6 and hemoglobin to 8.6 (baseline).  UA concerning for UTI.  CXR with cardiomegaly and patchy consolidation over medial RL hemithorax concerning for pneumonia/aspiration.  Blood and urine culture obtained.  She is given a liter of LR and started on aztreonam, Flagyl and vancomycin.  Medicine was called to admit patient for further evaluation and management.  Review of Systems:  -Patient responds "no" to every question.  Past Medical History:  Diagnosis Date  . Blood transfusion 2006  . Colon polyps   . CVA (cerebral infarction) 2013  . Dementia   . Diverticulosis of colon   . DJD (degenerative joint disease)   . Hyperlipidemia   . Hypertension   . Hypothyroidism   . Melanoma ([place]) 03/20/15   removed with Moh's surgery  . Myelodysplasia 02/04/2012  . Normochromic normocytic  anemia 02/04/2012  . Onychomycosis   . Osteoporosis   . Rectal prolapse   . Shoulder pain   . Unstable gait     Past Surgical History:  Procedure Laterality Date  . APPENDECTOMY    . CARPAL TUNNEL RELEASE Bilateral 2006  . FLEXIBLE SIGMOIDOSCOPY  07/09/2012   Procedure: FLEXIBLE SIGMOIDOSCOPY;  Surgeon: Inda Castle, MD;  Location: [facility];  Service: Endoscopy;  Laterality: N/A;  . MELANOMA EXCISION Right 2005   arm  . POLYPECTOMY  2004   Laparoscopic  . RECTOCELE REPAIR    . REPLACEMENT TOTAL KNEE  2005 & 2006  . VAGINAL HYSTERECTOMY  1989    SOCIAL HISTORY:  reports that she has never smoked. She has never used smokeless tobacco. She reports that she does not drink alcohol or use drugs.  Allergies  Allergen Reactions  . Lisinopril     REACTION: angioedema  . Penicillins     REACTION: rash    FAMILY HISTORY: Family History  Problem Relation Age of Onset  . Prostate cancer Brother   . Cancer Brother        renal cell carcinoma  . Heart disease Brother   . Breast cancer Mother 23  . Cancer Father        unknown kind  . Arthritis Daughter      Prior to Admission medications   Medication Sig Start Date End Date Taking? Authorizing Provider  acetaminophen (TYLENOL) 500 MG tablet Take 500 mg by mouth every 12 (twelve) hours. 8:00 am and  8:00 pm   Yes [provider]  aspirin EC 81 MG tablet Take 81 mg by mouth daily.   Yes [provider]  Calcium Carbonate-Vit D-Min (CALTRATE 600+D PLUS) 600-400 MG-UNIT per tablet Take 1 tablet by mouth daily.     Yes [provider]  Cholecalciferol (VITAMIN D-3 PO) Take 1,000 mg by mouth daily. 1,000mg  one daily   Yes [provider]  cycloSPORINE (RESTASIS) 0.05 % ophthalmic emulsion Place 1 drop into both eyes 2 (two) times daily.     Yes [provider]  donepezil (ARICEPT) 10 MG tablet Take 10 mg by mouth daily.   Yes [provider]  levothyroxine (SYNTHROID,  LEVOTHROID) 88 MCG tablet TAKE 1 TABLET BY MOUTH ONCE DAILY 12/12/14  Yes Marletta Lor, MD  losartan (COZAAR) 100 MG tablet TAKE 1 TABLET EVERY DAY FOR BLOOD PRESSURE Patient taking differently: Take 100 mg by mouth daily.  12/26/14  Yes Marletta Lor, MD  Multiple Vitamin (MULTIVITAMIN WITH MINERALS) TABS Take 1 tablet by mouth daily.   Yes [provider]  Nutritional Supplements (NUTRITIONAL SHAKE PO) Take 120 mLs by mouth 2 (two) times daily.   Yes [provider]  polyethylene glycol powder (GLYCOLAX/MIRALAX) powder Take 17 g by mouth daily as needed for mild constipation.    Yes [provider]  Sodium Fluoride (PREVIDENT 5000 BOOSTER PLUS) 1.1 % PSTE Place onto teeth.   Yes [provider]  acetaminophen (TYLENOL) 500 MG tablet Take 500 mg by mouth every 8 (eight) hours as needed for mild pain.  05/31/13   Marletta Lor, MD  hydroxypropyl methylcellulose / hypromellose (ISOPTO TEARS / GONIOVISC) 2.5 % ophthalmic solution Place 1 drop into both eyes every 2 (two) hours as needed for dry eyes.    [provider]    Physical Exam: Vitals:   08/14/18 1949 08/14/18 1950 08/14/18 2358 08/15/18 0038  BP:   100/78   Pulse:   87   Resp:   18   Temp:  (!) 104 F (40 C)  97.8 F (36.6 C)  TempSrc:  Rectal  Oral  SpO2: 94%  94%       Constitutional: Elderly female.  No acute distress.  Nontoxic. Awake and alert but disoriented. Eyes: PERRL, lids and conjunctivae pale ENMT: Mucous membranes are moist.  Neck: normal, supple, no masses, no thyromegaly Respiratory: clear to auscultation bilaterally, no wheezing, no crackles. Normal respiratory effort. No accessory muscle use.  Cardiovascular: Regular rate and rhythm, no murmurs / rubs / gallops. No extremity edema.  Abdomen: no tenderness, no masses palpated.  No suprapubic tenderness. Musculoskeletal: No obvious deformity. Skin: no rashes, lesions, ulcers. No  induration Neurologic: CN 2-12 grossly intact.  Alert and awake.  Oriented to self. Psychiatric: calm  Labs on Admission: I have personally reviewed following labs and imaging studies  CBC: Recent Labs  Lab 08/11/18 08/14/18 2033  WBC 15.9 19.6*  NEUTROABS  --  14.7*  HGB 9.1* 8.6*  HCT 28* 26.1*  MCV  --  84.5  PLT 706* 568*   Basic Metabolic Panel: Recent Labs  Lab 08/14/18 2033  NA 144  K 4.9  CL 109  CO2 21*  GLUCOSE 152*  BUN 29*  CREATININE 1.56*  CALCIUM 10.2   GFR: CrCl cannot be calculated (Unknown ideal weight.). Liver Function Tests: Recent Labs  Lab 08/14/18 2033  AST 23  ALT 23  ALKPHOS 98  BILITOT 0.8  PROT 7.0  ALBUMIN 3.7  No results for input(s): LIPASE, AMYLASE in the last 168 hours. No results for input(s): AMMONIA in the last 168 hours. Coagulation Profile: Recent Labs  Lab 08/14/18 2033  INR 1.10   Cardiac Enzymes: No results for input(s): CKTOTAL, CKMB, CKMBINDEX, TROPONINI in the last 168 hours. BNP (last 3 results) No results for input(s): PROBNP in the last 8760 hours. HbA1C: No results for input(s): HGBA1C in the last 72 hours. CBG: No results for input(s): GLUCAP in the last 168 hours. Lipid Profile: No results for input(s): CHOL, HDL, LDLCALC, TRIG, CHOLHDL, LDLDIRECT in the last 72 hours. Thyroid Function Tests: No results for input(s): TSH, T4TOTAL, FREET4, T3FREE, THYROIDAB in the last 72 hours. Anemia Panel: No results for input(s): VITAMINB12, FOLATE, FERRITIN, TIBC, IRON, RETICCTPCT in the last 72 hours. Urine analysis:    Component Value Date/Time   COLORURINE YELLOW 08/14/2018 2033   APPEARANCEUR CLOUDY (A) 08/14/2018 2033   LABSPEC 1.012 08/14/2018 2033   LABSPEC 1.015 02/11/2007 1508   PHURINE 6.0 08/14/2018 2033   GLUCOSEU NEGATIVE 08/14/2018 2033   HGBUR SMALL (A) 08/14/2018 2033   BILIRUBINUR NEGATIVE 08/14/2018 2033   BILIRUBINUR Negative 02/11/2007 [place] 08/14/2018 2033    PROTEINUR 100 (A) 08/14/2018 2033   UROBILINOGEN 0.2 04/20/2013 1608   NITRITE NEGATIVE 08/14/2018 2033   LEUKOCYTESUR LARGE (A) 08/14/2018 2033   LEUKOCYTESUR Small 02/11/2007 1508   Sepsis Labs: Lactic acidosis resolved.  Leukocytosis to 19.6. )No results found for this or any previous visit (from the past 240 hour(s)).   Radiological Exams on Admission: Dg Chest 2 View  Result Date: 08/14/2018 CLINICAL DATA:  Patient with fever and vomiting.  Weakness. EXAM: CHEST - 2 VIEW COMPARISON:  None. FINDINGS: Monitoring leads overlie the patient. Enlarged cardiac and mediastinal contours. Thoracic aortic vascular calcifications. Low lung volumes. Bilateral perihilar interstitial pulmonary opacities. Patchy consolidation medial right lower hemithorax. No pleural effusion or pneumothorax. Thoracic spine degenerative changes. Rightward curvature of the thoracic spine. IMPRESSION: Cardiomegaly. Patchy consolidation medial right lower hemithorax may represent pneumonia or aspiration. Followup PA and lateral chest X-ray is recommended in 3-4 weeks following trial of antibiotic therapy to ensure resolution and exclude underlying malignancy. Electronically Signed   By: Lovey Newcomer M.D.   On: 08/14/2018 21:02     All images have been reviewed by me personally.   EKG: Independently reviewed.  No EKG obtained this admission  Assessment/Plan Principal Problem:   Sepsis ([place]) Active Problems:   Hypothyroidism   Essential hypertension   Vascular dementia without behavioral disturbance   Stroke (HCC)   Anemia, iron deficiency   CKD (chronic kidney disease) stage 3, GFR 30-59 ml/min (HCC)   Diastolic CHF (HCC)   UTI (urinary tract infection)   Leukocytosis   Sepsis: possible source are UTI and pneumonia.  Patient with fever, tachycardia, leukocytosis and lactic acidosis on arrival. Started on Vanco, aztreonam and Flagyl in ED. Status post 1 L LR Tachycardia and lactic acidosis resolved.   -Continue  vancomycin and aztreonam.  Patient with history of penicillin allergy. -Follow-up culture -D5-1/2NS@75  cc/hour for 8 hours.   UTI:  -Antibiotic as above -Follow urine cultures  Pneumonia: At risk for aspiration given dementia.  CXR with consolidation -Check procalcitonin -Antibiotic as above  Leukocytosis: -Treat infection as above -CBC in the morning  Hypothyroidism -Continue home Synthroid  Iron deficiency anemia -Hemoglobin at baseline -CBC in a.m.  AKI on CKD 3: Baseline creatinine 1.2 -IV fluid as above -Repeat BMP in a.m. -Hold  home losartan  HFpEF: No echocardiogram in the chart. -No signs of fluid overload -Monitor respiratory status while on IV fluid  Dementia: patient only oriented to self.  Not sure about baseline mental status. -Delirium precaution  Patient from facility:  -Social work consulted  DVT prophylaxis: Lovenox Code Status: DNR Family Communication: No family member at bedside Disposition Plan: MedSurg Consults called: None Admission status: Inpatient  Mercy Riding MD [facility] Pager [phone]  If 7PM-7AM, please contact night-coverage [email or web address] Password [facility]  08/15/2018, 12:51 AM

## 2018-08-14 NOTE — ED Notes (Signed)
Bed: WA20 Expected date:  Expected time:  Means of arrival:  Comments: 77 F from SNF fever

## 2018-08-14 NOTE — ED Notes (Signed)
ED TO INPATIENT HANDOFF REPORT  Name/Age/Gender Brittany [name] 84 y.o. female  Code Status    Code Status Orders  (From admission, onward)         Start     Ordered   08/14/18 2008  Do not attempt resuscitation/DNR  Continuous    Question Answer Comment  In the event of cardiac or respiratory ARREST Do not call a "code blue"   In the event of cardiac or respiratory ARREST Do not perform Intubation, CPR, defibrillation or ACLS   In the event of cardiac or respiratory ARREST Use medication by any route, position, wound care, and other measures to relive pain and suffering. May use oxygen, suction and manual treatment of airway obstruction as needed for comfort.      08/14/18 2007        Code Status History    Date Active Date Inactive Code Status Order ID Comments User Context   01/04/2016 1437 08/14/2018 1936 DNR [id number]  Ripley Fraise, CMA Outpatient    Advance Directive Documentation     Most Recent Value  Type of Advance Directive  Out of facility DNR (pink MOST or yellow form)  Pre-existing out of facility DNR order (yellow form or pink MOST form)  Yellow form placed in chart (order not valid for inpatient use)  "MOST" Form in Place?  -      Home/SNF/Other Nursing Home [facility] [place]  Chief Complaint weakness  Level of Care/Admitting Diagnosis ED Disposition    ED Disposition Condition [place]: [place] [[id number]]  Level of Care: Med-Surg [16]  Diagnosis: UTI (urinary tract infection) [665993]  Admitting Physician: Mercy Riding [[id number]]  Attending Physician: Mercy Riding [[id number]]  Estimated length of stay: past midnight tomorrow  Certification:: I certify this patient will need inpatient services for at least 2 midnights  PT Class (Do Not Modify): Inpatient [101]  PT Acc Code (Do Not Modify): Private [1]       Medical History Past Medical History:  Diagnosis Date  . Blood transfusion 2006  . Colon  polyps   . CVA (cerebral infarction) 2013  . Dementia   . Diverticulosis of colon   . DJD (degenerative joint disease)   . Hyperlipidemia   . Hypertension   . Hypothyroidism   . Melanoma ([place]) 03/20/15   removed with Moh's surgery  . Myelodysplasia 02/04/2012  . Normochromic normocytic anemia 02/04/2012  . Onychomycosis   . Osteoporosis   . Rectal prolapse   . Shoulder pain   . Unstable gait     Allergies Allergies  Allergen Reactions  . Lisinopril     REACTION: angioedema  . Penicillins     REACTION: rash    IV Location/Drains/Wounds Patient Lines/Drains/Airways Status   Active Line/Drains/Airways    Name:   Placement date:   Placement time:   Site:   Days:   Peripheral IV 07/09/12 Right;Lateral Forearm   07/09/12    0830    Forearm   2227          Labs/Imaging Results for orders placed or performed during the hospital encounter of 08/14/18 (from the past 48 hour(s))  I-Stat CG4 Lactic Acid, ED     Status: Abnormal   Collection Time: 08/14/18  8:31 PM  Result Value Ref Range   Lactic Acid, Venous 2.49 (HH) 0.5 - 1.9 mmol/L   Comment NOTIFIED PHYSICIAN   Comprehensive metabolic panel     Status:  Abnormal   Collection Time: 08/14/18  8:33 PM  Result Value Ref Range   Sodium 144 135 - 145 mmol/L   Potassium 4.9 3.5 - 5.1 mmol/L   Chloride 109 98 - 111 mmol/L   CO2 21 (L) 22 - 32 mmol/L   Glucose, Bld 152 (H) 70 - 99 mg/dL   BUN 29 (H) 8 - 23 mg/dL   Creatinine, Ser 1.56 (H) 0.44 - 1.00 mg/dL   Calcium 10.2 8.9 - 10.3 mg/dL   Total Protein 7.0 6.5 - 8.1 g/dL   Albumin 3.7 3.5 - 5.0 g/dL   AST 23 15 - 41 U/L   ALT 23 0 - 44 U/L   Alkaline Phosphatase 98 38 - 126 U/L   Total Bilirubin 0.8 0.3 - 1.2 mg/dL   GFR calc non Af Amer 28 (L) >60 mL/min   GFR calc Af Amer 32 (L) >60 mL/min    Comment: (NOTE) The eGFR has been calculated using the CKD EPI equation. This calculation has not been validated in all clinical situations. eGFR's persistently <60 mL/min  signify possible Chronic Kidney Disease.    Anion gap 14 5 - 15    Comment: Performed at [facility], [place] [address], [place], [place] [address]  CBC with Differential     Status: Abnormal   Collection Time: 08/14/18  8:33 PM  Result Value Ref Range   WBC 19.6 (H) 4.0 - 10.5 K/uL   RBC 3.09 (L) 3.87 - 5.11 MIL/uL   Hemoglobin 8.6 (L) 12.0 - 15.0 g/dL   HCT 26.1 (L) 36.0 - 46.0 %   MCV 84.5 78.0 - 100.0 fL   MCH 27.8 26.0 - 34.0 pg   MCHC 33.0 30.0 - 36.0 g/dL   RDW 17.2 (H) 11.5 - 15.5 %   Platelets 572 (H) 150 - 400 K/uL   Neutrophils Relative % 75 %   Lymphocytes Relative 4 %   Monocytes Relative 20 %   Eosinophils Relative 0 %   Basophils Relative 1 %   Band Neutrophils 0 %   Metamyelocytes Relative 0 %   Myelocytes 0 %   Promyelocytes Relative 0 %   Blasts 0 %   nRBC 0 0 /100 WBC   Neutro Abs 14.7 (H) 1.7 - 7.7 K/uL   Lymphs Abs 0.8 0.7 - 4.0 K/uL   Monocytes Absolute 3.9 (H) 0.1 - 1.0 K/uL   Eosinophils Absolute 0.0 0.0 - 0.7 K/uL   Basophils Absolute 0.2 (H) 0.0 - 0.1 K/uL   WBC Morphology Almquist COUNT CONFIRMED ON SMEAR     Comment: Performed at [facility], [place] [address], [place], [place] [address]  Protime-INR     Status: None   Collection Time: 08/14/18  8:33 PM  Result Value Ref Range   Prothrombin Time 14.1 11.4 - 15.2 seconds   INR 1.10     Comment: Performed at [facility], [place] [address], [place], [place] [address]  Urinalysis, Routine w reflex microscopic     Status: Abnormal   Collection Time: 08/14/18  8:33 PM  Result Value Ref Range   Color, Urine YELLOW YELLOW   APPearance CLOUDY (A) CLEAR   Specific Gravity, Urine 1.012 1.005 - 1.030   pH 6.0 5.0 - 8.0   Glucose, UA NEGATIVE NEGATIVE mg/dL   Hgb urine dipstick SMALL (A) NEGATIVE   Bilirubin Urine NEGATIVE NEGATIVE   Ketones, ur NEGATIVE NEGATIVE mg/dL   Protein, ur 100 (A) NEGATIVE mg/dL  Nitrite NEGATIVE NEGATIVE   Leukocytes, UA  LARGE (A) NEGATIVE   RBC / HPF 11-20 0 - 5 RBC/hpf   WBC, UA >50 (H) 0 - 5 WBC/hpf   Bacteria, UA MANY (A) NONE SEEN   Squamous Epithelial / LPF 0-5 0 - 5   WBC Clumps PRESENT    Mucus PRESENT    Hyaline Casts, UA PRESENT     Comment: Performed at [facility], [place] [address] [place], [place] [address]  I-Stat CG4 Lactic Acid, ED  (not at  [facility])     Status: None   Collection Time: 08/14/18 10:11 PM  Result Value Ref Range   Lactic Acid, Venous 1.05 0.5 - 1.9 mmol/L   Dg Chest 2 View  Result Date: 08/14/2018 CLINICAL DATA:  Patient with fever and vomiting.  Weakness. EXAM: CHEST - 2 VIEW COMPARISON:  None. FINDINGS: Monitoring leads overlie the patient. Enlarged cardiac and mediastinal contours. Thoracic aortic vascular calcifications. Low lung volumes. Bilateral perihilar interstitial pulmonary opacities. Patchy consolidation medial right lower hemithorax. No pleural effusion or pneumothorax. Thoracic spine degenerative changes. Rightward curvature of the thoracic spine. IMPRESSION: Cardiomegaly. Patchy consolidation medial right lower hemithorax may represent pneumonia or aspiration. Followup PA and lateral chest X-ray is recommended in 3-4 weeks following trial of antibiotic therapy to ensure resolution and exclude underlying malignancy. Electronically Signed   By: Lovey Newcomer M.D.   On: 08/14/2018 21:02    Pending Labs Unresulted Labs (From admission, onward)    Start     Ordered   08/14/18 2338  Procalcitonin - Baseline  STAT,   STAT     08/14/18 2337   08/14/18 2217  Urine culture  Add-on,   STAT     08/14/18 2216   08/14/18 1956  Culture, blood (Routine x 2)  BLOOD CULTURE X 2,   STAT     08/14/18 1956   Signed and Held  CBC  (enoxaparin (LOVENOX)    CrCl < 30 ml/min)  Once,   R    Comments:  Baseline for enoxaparin therapy IF NOT ALREADY DRAWN.  Notify MD if PLT < 100 K.    Signed and Held   Signed and Held  Creatinine, serum  (enoxaparin (LOVENOX)    CrCl <  30 ml/min)  Once,   R    Comments:  Baseline for enoxaparin therapy IF NOT ALREADY DRAWN.    Signed and Held   Signed and Held  Creatinine, serum  (enoxaparin (LOVENOX)    CrCl < 30 ml/min)  Weekly,   R    Comments:  while on enoxaparin therapy.    Signed and Held   Signed and Held  Basic metabolic panel  Tomorrow morning,   R     Signed and Held   Signed and Held  CBC  Tomorrow morning,   R     Signed and Held          Vitals/Pain Today's Vitals   08/14/18 1941 08/14/18 1949 08/14/18 1950  Temp: (P) 98.5 F (36.9 C)  (!) 104 F (40 C)  TempSrc: (P) Oral  Rectal  SpO2:  94%     Isolation Precautions No active isolations  Medications Medications  metroNIDAZOLE (FLAGYL) IVPB 500 mg (0 mg Intravenous Stopped 08/14/18 2228)  aztreonam (AZACTAM) 2 g in sodium chloride 0.9 % 100 mL IVPB (0 g Intravenous Stopped 08/14/18 2123)  vancomycin (VANCOCIN) IVPB 1000 mg/200 mL premix (0 mg Intravenous Stopped 08/14/18 2320)  lactated ringers  bolus 1,000 mL (0 mLs Intravenous Stopped 08/14/18 2123)    Mobility walks with device

## 2018-08-14 NOTE — ED Provider Notes (Signed)
Emergency Department Provider Note   I have reviewed the triage vital signs and the nursing notes.   HISTORY  Chief Complaint Emesis and Weakness   HPI Brittany [name] is a 84 y.o. female with multiple medical problems documented below the presents the emergency department today for generalized weakness vomiting.  According to EMS the patient had one episode of nonbloody nonbilious vomiting at the facility which she resides.  She is also found to be weak there so they called EMS.  On EMS arrival patient was sleepier than per her baseline but was alert and oriented per her baseline.  She had a fever with down to 103.  No interventions prior to arrival here.  Patient denies cough but also does not remember vomiting or know where she is at right now. No other associated or modifying symptoms.   LEVEL CAVEAT 2/2 DEMENTIA  Past Medical History:  Diagnosis Date  . Blood transfusion 2006  . Colon polyps   . CVA (cerebral infarction) 2013  . Dementia   . Diverticulosis of colon   . DJD (degenerative joint disease)   . Hyperlipidemia   . Hypertension   . Hypothyroidism   . Melanoma ([place]) 03/20/15   removed with Moh's surgery  . Myelodysplasia 02/04/2012  . Normochromic normocytic anemia 02/04/2012  . Onychomycosis   . Osteoporosis   . Rectal prolapse   . Shoulder pain   . Unstable gait     Patient Active Problem List   Diagnosis Date Noted  . UTI (urinary tract infection) 08/14/2018  . Fatigue 07/28/2018  . Weight loss 07/28/2018  . Actinic keratosis 06/26/2018  . Diastolic CHF ([place]) 19/14/7829  . History of CVA (cerebrovascular accident) 08/08/2017  . CKD (chronic kidney disease) stage 3, GFR 30-59 ml/min (HCC) 08/08/2017  . Anemia, iron deficiency 01/11/2016  . Unstable gait   . Shoulder pain   . History of melanoma 03/20/2015  . Constipation 07/18/2014  . Vascular dementia without behavioral disturbance 11/11/2012  . Stroke ([place]) 12/17/2011  . Back pain 04/23/2011  .  COLONIC POLYPS, HX OF 06/05/2009  . Myelodysplastic syndrome ([place]) 01/02/2009  . Hypothyroidism 08/21/2007  . Hyperlipidemia 08/21/2007  . Essential hypertension 08/21/2007  . DIVERTICULOSIS, COLON 08/21/2007  . OSTEOPOROSIS 08/21/2007  . SKIN CANCER, HX OF 08/21/2007    Past Surgical History:  Procedure Laterality Date  . APPENDECTOMY    . CARPAL TUNNEL RELEASE Bilateral 2006  . FLEXIBLE SIGMOIDOSCOPY  07/09/2012   Procedure: FLEXIBLE SIGMOIDOSCOPY;  Surgeon: Inda Castle, MD;  Location: [facility] END[facility]COPY;  Service: Endoscopy;  Laterality: N/A;  . MELANOMA EXCISION Right 2005   arm  . POLYPECTOMY  2004   Laparoscopic  . RECTOCELE REPAIR    . REPLACEMENT TOTAL KNEE  2005 & 2006  . VAGINAL HYSTERECTOMY  1989    Current Outpatient Rx  . Order #: [id number] Class: Historical Med  . Order #: [id number] Class: Historical Med  . Order #: [id number] Class: Historical Med  . Order #: [id number] Class: Historical Med  . Order #: [id number] Class: Historical Med  . Order #: [id number] Class: Historical Med  . Order #: [id number] Class: Normal  . Order #: [id number] Class: Normal  . Order #: [id number] Class: Historical Med  . Order #: [id number] Class: Historical Med  . Order #: [id number] Class: Historical Med  . Order #: [id number] Class: Historical Med  . Order #: [id number] Class: Historical Med  . Order #: [id number] Class: Historical Med    Allergies Lisinopril and Penicillins  Family History  Problem Relation Age of Onset  .  Prostate cancer Brother   . Cancer Brother        renal cell carcinoma  . Heart disease Brother   . Breast cancer Mother 19  . Cancer Father        unknown kind  . Arthritis Daughter     Social History Social History   Tobacco Use  . Smoking status: Never Smoker  . Smokeless tobacco: Never Used  Substance Use Topics  . Alcohol use: No  . Drug use: No    Review of Systems  LEVEL CAVEAT 2/2 DEMENTIA ____________________________________________   PHYSICAL  EXAM:  VITAL SIGNS: ED Triage Vitals  Temperature (!) 104 F (40 C), temperature source Rectal, SpO2 94 %.   Constitutional: Alert and oriented. Well appearing and in no acute distress. Eyes: Conjunctivae are normal. PERRL. EOMI. Head: Atraumatic. Nose: No congestion/rhinnorhea. Mouth/Throat: Mucous membranes are moist.  Oropharynx non-erythematous. Neck: No stridor.  No meningeal signs.   Cardiovascular: Normal rate, regular rhythm. Good peripheral circulation. Grossly normal heart sounds.   Respiratory: Normal respiratory effort.  No retractions. Lungs CTAB. Gastrointestinal: Soft and suprapubic ttp. No distention.  Musculoskeletal: No lower extremity tenderness nor edema. No gross deformities of extremities. Neurologic:  Normal speech and language. No gross focal neurologic deficits are appreciated.  Skin:  Skin is warm, dry and intact. No rash noted.  ____________________________________________   LABS (all labs ordered are listed, but only abnormal results are displayed)  Labs Reviewed  COMPREHENSIVE METABOLIC PANEL - Abnormal; Notable for the following components:      Result Value   CO2 21 (*)    Glucose, Bld 152 (*)    BUN 29 (*)    Creatinine, Ser 1.56 (*)    GFR calc non Af Amer 28 (*)    GFR calc Af Amer 32 (*)    All other components within normal limits  CBC WITH DIFFERENTIAL/PLATELET - Abnormal; Notable for the following components:   WBC 19.6 (*)    RBC 3.09 (*)    Hemoglobin 8.6 (*)    HCT 26.1 (*)    RDW 17.2 (*)    Platelets 572 (*)    Neutro Abs 14.7 (*)    Monocytes Absolute 3.9 (*)    Basophils Absolute 0.2 (*)    All other components within normal limits  URINALYSIS, ROUTINE W REFLEX MICROSCOPIC - Abnormal; Notable for the following components:   APPearance CLOUDY (*)    Hgb urine dipstick SMALL (*)    Protein, ur 100 (*)    Leukocytes, UA LARGE (*)    WBC, UA >50 (*)    Bacteria, UA MANY (*)    All other components within normal limits   I-STAT CG4 LACTIC ACID, ED - Abnormal; Notable for the following components:   Lactic Acid, Venous 2.49 (*)    All other components within normal limits  CULTURE, BLOOD (ROUTINE X 2)  CULTURE, BLOOD (ROUTINE X 2)  URINE CULTURE  PROTIME-INR  PROCALCITONIN  I-STAT CG4 LACTIC ACID, ED  I-STAT CG4 LACTIC ACID, ED  I-STAT CG4 LACTIC ACID, ED   ____________________________________________  EKG   EKG Interpretation  Date/Time:    Ventricular Rate:    PR Interval:    QRS Duration:   QT Interval:    QTC Calculation:   R Axis:     Text Interpretation:        ____________________________________________  RADIOLOGY  Dg Chest 2 View  Result Date: 08/14/2018 CLINICAL DATA:  Patient with fever and vomiting.  Weakness.  EXAM: CHEST - 2 VIEW COMPARISON:  None. FINDINGS: Monitoring leads overlie the patient. Enlarged cardiac and mediastinal contours. Thoracic aortic vascular calcifications. Low lung volumes. Bilateral perihilar interstitial pulmonary opacities. Patchy consolidation medial right lower hemithorax. No pleural effusion or pneumothorax. Thoracic spine degenerative changes. Rightward curvature of the thoracic spine. IMPRESSION: Cardiomegaly. Patchy consolidation medial right lower hemithorax may represent pneumonia or aspiration. Followup PA and lateral chest X-ray is recommended in 3-4 weeks following trial of antibiotic therapy to ensure resolution and exclude underlying malignancy. Electronically Signed   By: Lovey Newcomer M.D.   On: 08/14/2018 21:02    ____________________________________________   PROCEDURES  Procedure(s) performed:   Procedures  CRITICAL CARE Performed by: Merrily Pew Total critical care time: 35 minutes Critical care time was exclusive of separately billable procedures and treating other patients. Critical care was necessary to treat or prevent imminent or life-threatening deterioration. Critical care was time spent personally by me on the  following activities: development of treatment plan with patient and/or surrogate as well as nursing, discussions with consultants, evaluation of patient's response to treatment, examination of patient, obtaining history from patient or surrogate, ordering and performing treatments and interventions, ordering and review of laboratory studies, ordering and review of radiographic studies, pulse oximetry and re-evaluation of patient's condition.  ____________________________________________   INITIAL IMPRESSION / ASSESSMENT AND PLAN / ED COURSE  Suspect likely UTI. Sepsis initiated for tachycardia/fever, suspected infection. PCN allergy.   Symptoms improved significantly with fluids, antibiotics and found to have a urinary tract infection.  Discussed with hospitalist who will admit   Pertinent labs & imaging results that were available during my care of the patient were reviewed by me and considered in my medical decision making (see chart for details).  ____________________________________________  FINAL CLINICAL IMPRESSION(S) / ED DIAGNOSES  Final diagnoses:  Sepsis, due to unspecified organism [facility])  Urinary tract infection without hematuria, site unspecified     MEDICATIONS GIVEN DURING THIS VISIT:  Medications  metroNIDAZOLE (FLAGYL) IVPB 500 mg (0 mg Intravenous Stopped 08/14/18 2228)  aztreonam (AZACTAM) 2 g in sodium chloride 0.9 % 100 mL IVPB (0 g Intravenous Stopped 08/14/18 2123)  vancomycin (VANCOCIN) IVPB 1000 mg/200 mL premix (0 mg Intravenous Stopped 08/14/18 2320)  lactated ringers bolus 1,000 mL (0 mLs Intravenous Stopped 08/14/18 2123)     NEW OUTPATIENT MEDICATIONS STARTED DURING THIS VISIT:  New Prescriptions   No medications on file    Note:  This note was prepared with assistance of Dragon voice recognition software. Occasional wrong-word or sound-a-like substitutions may have occurred due to the inherent limitations of voice recognition software.   Bryttany Tortorelli,  Corene Cornea, MD 08/15/18 [phone]

## 2018-08-14 NOTE — ED Triage Notes (Signed)
Pt comes to ed, via ems, comes from [facility], facility reported fever and vomiting today, with weakness.  Pt is DNR, and at baseline is jerky movements, bp 130/50, rr 18, room air 94, cbg 268, hr 114.  20 in [place], 50 cc ns

## 2018-08-14 NOTE — Progress Notes (Signed)
A consult was received from an ED physician for vanc and aztreonam per pharmacy dosing.  The patient's profile has been reviewed for ht/wt/allergies/indication/available labs.   A one time order has been placed for vanc and aztreonam.    Further antibiotics/pharmacy consults should be ordered by admitting physician if indicated.                       Thank you, Dolly Rias RPh 08/14/2018, 8:04 PM Pager [phone]

## 2018-08-15 ENCOUNTER — Inpatient Hospital Stay (HOSPITAL_COMMUNITY): Payer: Medicare Other

## 2018-08-15 ENCOUNTER — Other Ambulatory Visit: Payer: Self-pay

## 2018-08-15 DIAGNOSIS — A419 Sepsis, unspecified organism: Secondary | ICD-10-CM

## 2018-08-15 DIAGNOSIS — D72829 Elevated white blood cell count, unspecified: Secondary | ICD-10-CM

## 2018-08-15 LAB — BLOOD CULTURE ID PANEL (REFLEXED)
Acinetobacter baumannii: NOT DETECTED
CANDIDA GLABRATA: NOT DETECTED
CANDIDA KRUSEI: NOT DETECTED
CANDIDA PARAPSILOSIS: NOT DETECTED
CANDIDA TROPICALIS: NOT DETECTED
CARBAPENEM RESISTANCE: NOT DETECTED
Candida albicans: NOT DETECTED
ENTEROBACTERIACEAE SPECIES: DETECTED — AB
Enterobacter cloacae complex: NOT DETECTED
Enterococcus species: NOT DETECTED
Escherichia coli: DETECTED — AB
HAEMOPHILUS INFLUENZAE: NOT DETECTED
KLEBSIELLA PNEUMONIAE: NOT DETECTED
Klebsiella oxytoca: NOT DETECTED
Listeria monocytogenes: NOT DETECTED
Neisseria meningitidis: NOT DETECTED
Proteus species: NOT DETECTED
Pseudomonas aeruginosa: NOT DETECTED
STAPHYLOCOCCUS SPECIES: NOT DETECTED
STREPTOCOCCUS PNEUMONIAE: NOT DETECTED
Serratia marcescens: NOT DETECTED
Staphylococcus aureus (BCID): NOT DETECTED
Streptococcus agalactiae: NOT DETECTED
Streptococcus pyogenes: NOT DETECTED
Streptococcus species: NOT DETECTED

## 2018-08-15 LAB — CBC
HCT: 23 % — ABNORMAL LOW (ref 36.0–46.0)
HEMATOCRIT: 22.8 % — AB (ref 36.0–46.0)
HEMOGLOBIN: 7.4 g/dL — AB (ref 12.0–15.0)
HEMOGLOBIN: 7.3 g/dL — AB (ref 12.0–15.0)
MCH: 27.4 pg (ref 26.0–34.0)
MCH: 27.1 pg (ref 26.0–34.0)
MCHC: 32.2 g/dL (ref 30.0–36.0)
MCHC: 32 g/dL (ref 30.0–36.0)
MCV: 85.2 fL (ref 78.0–100.0)
MCV: 84.8 fL (ref 78.0–100.0)
PLATELETS: 418 10*3/uL — AB (ref 150–400)
Platelets: 425 10*3/uL — ABNORMAL HIGH (ref 150–400)
RBC: 2.7 MIL/uL — AB (ref 3.87–5.11)
RBC: 2.69 MIL/uL — AB (ref 3.87–5.11)
RDW: 17.6 % — ABNORMAL HIGH (ref 11.5–15.5)
RDW: 17.8 % — ABNORMAL HIGH (ref 11.5–15.5)
WBC: 16.3 10*3/uL — AB (ref 4.0–10.5)
WBC: 18.4 10*3/uL — AB (ref 4.0–10.5)

## 2018-08-15 LAB — BASIC METABOLIC PANEL
ANION GAP: 12 (ref 5–15)
Anion gap: 8 (ref 5–15)
BUN: 30 mg/dL — ABNORMAL HIGH (ref 8–23)
BUN: 36 mg/dL — ABNORMAL HIGH (ref 8–23)
CALCIUM: 9.1 mg/dL (ref 8.9–10.3)
CHLORIDE: 112 mmol/L — AB (ref 98–111)
CO2: 20 mmol/L — ABNORMAL LOW (ref 22–32)
CO2: 23 mmol/L (ref 22–32)
CREATININE: 1.72 mg/dL — AB (ref 0.44–1.00)
Calcium: 9.1 mg/dL (ref 8.9–10.3)
Chloride: 110 mmol/L (ref 98–111)
Creatinine, Ser: 1.62 mg/dL — ABNORMAL HIGH (ref 0.44–1.00)
GFR calc non Af Amer: 25 mL/min — ABNORMAL LOW (ref >60)
GFR, EST AFRICAN AMERICAN: 31 mL/min — AB (ref >60)
GFR, EST AFRICAN AMERICAN: 29 mL/min — AB (ref >60)
GFR, EST NON AFRICAN AMERICAN: 26 mL/min — AB (ref >60)
GLUCOSE: 154 mg/dL — AB (ref 70–99)
Glucose, Bld: 129 mg/dL — ABNORMAL HIGH (ref 70–99)
POTASSIUM: 4.6 mmol/L (ref 3.5–5.1)
Potassium: 4.6 mmol/L (ref 3.5–5.1)
SODIUM: 142 mmol/L (ref 135–145)
Sodium: 143 mmol/L (ref 135–145)

## 2018-08-15 LAB — GLUCOSE, CAPILLARY: GLUCOSE-CAPILLARY: 138 mg/dL — AB (ref 70–99)

## 2018-08-15 LAB — PREPARE RBC (CROSSMATCH)

## 2018-08-15 LAB — PROCALCITONIN: Procalcitonin: 11.62 ng/mL

## 2018-08-15 LAB — MRSA PCR SCREENING: MRSA BY PCR: NEGATIVE

## 2018-08-15 MED ORDER — LEVOTHYROXINE SODIUM 100 MCG IV SOLR
44.0000 ug | Freq: Once | INTRAVENOUS | Status: AC
  Administered 2018-08-15: 44 ug via INTRAVENOUS
  Filled 2018-08-15: qty 5

## 2018-08-15 MED ORDER — SODIUM CHLORIDE 0.9 % IV SOLN
100.0000 mg | Freq: Two times a day (BID) | INTRAVENOUS | Status: DC
  Administered 2018-08-15 – 2018-08-17 (×6): 100 mg via INTRAVENOUS
  Filled 2018-08-15 (×7): qty 100

## 2018-08-15 MED ORDER — ACETAMINOPHEN 325 MG PO TABS
650.0000 mg | ORAL_TABLET | Freq: Four times a day (QID) | ORAL | Status: DC | PRN
  Administered 2018-08-15: 650 mg via ORAL
  Filled 2018-08-15: qty 2

## 2018-08-15 MED ORDER — DEXTROSE-NACL 5-0.45 % IV SOLN: INTRAVENOUS | Status: DC

## 2018-08-15 MED ORDER — DEXTROSE-NACL 5-0.45 % IV SOLN
INTRAVENOUS | Status: AC
  Administered 2018-08-15: 01:00:00 via INTRAVENOUS

## 2018-08-15 MED ORDER — VANCOMYCIN HCL 500 MG IV SOLR: 500.0000 mg | INTRAVENOUS | Status: DC

## 2018-08-15 MED ORDER — SODIUM CHLORIDE 0.9 % IV SOLN
1.0000 g | Freq: Two times a day (BID) | INTRAVENOUS | Status: DC
  Administered 2018-08-15 – 2018-08-16 (×4): 1 g via INTRAVENOUS
  Filled 2018-08-15 (×5): qty 1

## 2018-08-15 MED ORDER — ENOXAPARIN SODIUM 30 MG/0.3ML ~~LOC~~ SOLN
30.0000 mg | SUBCUTANEOUS | Status: DC
  Administered 2018-08-15 – 2018-08-18 (×4): 30 mg via SUBCUTANEOUS
  Filled 2018-08-15 (×4): qty 0.3

## 2018-08-15 MED ORDER — DEXTROSE-NACL 5-0.45 % IV SOLN
INTRAVENOUS | Status: DC
  Administered 2018-08-16: 08:00:00 via INTRAVENOUS

## 2018-08-15 MED ORDER — LEVOTHYROXINE SODIUM 88 MCG PO TABS: 88.0000 ug | ORAL_TABLET | Freq: Every day | ORAL | Status: DC

## 2018-08-15 MED ORDER — SODIUM CHLORIDE 0.9% IV SOLUTION: Freq: Once | INTRAVENOUS | Status: DC

## 2018-08-15 MED ORDER — POLYETHYLENE GLYCOL 3350 17 G PO PACK: 17.0000 g | PACK | Freq: Every day | ORAL | Status: DC | PRN

## 2018-08-15 MED ORDER — GLYCERIN-HYPROMELLOSE-PEG 400 0.2-0.2-1 % OP SOLN
1.0000 [drp] | OPHTHALMIC | Status: DC | PRN
  Filled 2018-08-15: qty 15

## 2018-08-15 MED ORDER — DIPHENHYDRAMINE HCL 50 MG/ML IJ SOLN
12.5000 mg | Freq: Once | INTRAMUSCULAR | Status: AC
  Administered 2018-08-15: 12.5 mg via INTRAVENOUS
  Filled 2018-08-15: qty 1

## 2018-08-15 MED ORDER — HALOPERIDOL LACTATE 5 MG/ML IJ SOLN: 2.0000 mg | Freq: Four times a day (QID) | INTRAMUSCULAR | Status: DC | PRN

## 2018-08-15 MED ORDER — CYCLOSPORINE 0.05 % OP EMUL
1.0000 [drp] | Freq: Two times a day (BID) | OPHTHALMIC | Status: DC
  Administered 2018-08-15 – 2018-08-18 (×6): 1 [drp] via OPHTHALMIC
  Administered 2018-08-18: 22:00:00 via OPHTHALMIC
  Filled 2018-08-15 (×8): qty 1

## 2018-08-15 MED ORDER — DEXTROSE-NACL 5-0.45 % IV SOLN
INTRAVENOUS | Status: DC
  Administered 2018-08-15: 11:00:00 via INTRAVENOUS

## 2018-08-15 NOTE — Progress Notes (Signed)
Pharmacy Antibiotic Note  Brittany [name] is a 84 y.o. female admitted on 08/14/2018 with sepsis.  Pharmacy has been consulted for Vancomycin, aztreonam dosing.  Plan: Aztreonam 1gm iv q12hr Vancomycin 1gm iv x1, then 500mg  iv q48hr  Goal AUC = 400 - 500 for all indications, except meningitis (goal AUC > 500 and Cmin 15-20 mcg/mL)   Height: 4\' 11"  (149.9 cm) Weight: 153 lb (69.4 kg) IBW/kg (Calculated) : 43.2  Temp (24hrs), Avg:99.9 F (37.7 C), Min:97.8 F (36.6 C), Max:104 F (40 C)  Recent Labs  Lab 08/11/18 08/14/18 2031 08/14/18 2033 08/14/18 2211 08/15/18 0449  WBC 15.9  --  19.6*  --  16.3*  CREATININE  --   --  1.56*  --   --   LATICACIDVEN  --  2.49*  --  1.05  --     Estimated Creatinine Clearance: 19.5 mL/min (A) (by C-G formula based on SCr of 1.56 mg/dL (H)).    Allergies  Allergen Reactions  . Lisinopril     REACTION: angioedema  . Penicillins     REACTION: rash    Antimicrobials this admission: Vancomycin 08/14/2018 >> Aztreonam 08/14/2018 >>   Dose adjustments this admission: -  Microbiology results: -  Thank you for allowing pharmacy to be a part of this patient's care.  Nani Skillern Crowford 08/15/2018 5:37 AM

## 2018-08-15 NOTE — Progress Notes (Signed)
   08/15/18 1119  MEWS Score  Level of Consciousness Responds to Pain  MEWS Score  MEWS RR 0  MEWS Pulse 1  MEWS Systolic 1  MEWS LOC 2  MEWS Temp 1  MEWS Score 5  MEWS Score Color Red    Vital Signs MEWS/VS Documentation      08/15/2018 0107 08/15/2018 0502 08/15/2018 1119 08/15/2018 1428   MEWS Score:  0  3  5  2    MEWS Score Color:  Green  Yellow  Red  Yellow   Resp:  18  20  -  20   Pulse:  79  (!) 110  -  77   BP:  108/63  (!) 100/53  -  (!) 114/55   Temp:  98.5 F (36.9 C)  (!) 100.7 F (38.2 C)  -  98.4 F (36.9 C)   O2 Device:  Room Air  Nasal Cannula  -  Room Air   O2 Flow Rate (L/min):  -  2 L/min  -  -   Level of Consciousness:  Alert  -  Responds to Pain  -      MD notified and CT of the competed.     Murray Hodgkins [place] 08/15/2018,7:03 PM

## 2018-08-15 NOTE — Progress Notes (Signed)
PHARMACY - PHYSICIAN COMMUNICATION CRITICAL VALUE ALERT - BLOOD CULTURE IDENTIFICATION (BCID)  Brittany [name] is an 84 y.o. female who presented to [facility] on 08/14/2018 with a chief complaint of N/V.  Assessment:  Treating for sepsis with possible sources UTI or pneumonia.  Name of physician (or Provider) Contacted: Berle Mull  Current antibiotics: Aztreonam and doxycycline  Changes to prescribed antibiotics recommended:  None.  Results for orders placed or performed during the hospital encounter of 08/14/18  Blood Culture ID Panel (Reflexed) (Collected: 08/14/2018  8:21 PM)  Result Value Ref Range   Enterococcus species NOT DETECTED NOT DETECTED   Listeria monocytogenes NOT DETECTED NOT DETECTED   Staphylococcus species NOT DETECTED NOT DETECTED   Staphylococcus aureus NOT DETECTED NOT DETECTED   Streptococcus species NOT DETECTED NOT DETECTED   Streptococcus agalactiae NOT DETECTED NOT DETECTED   Streptococcus pneumoniae NOT DETECTED NOT DETECTED   Streptococcus pyogenes NOT DETECTED NOT DETECTED   Acinetobacter baumannii NOT DETECTED NOT DETECTED   Enterobacteriaceae species DETECTED (A) NOT DETECTED   Enterobacter cloacae complex NOT DETECTED NOT DETECTED   Escherichia coli DETECTED (A) NOT DETECTED   Klebsiella oxytoca NOT DETECTED NOT DETECTED   Klebsiella pneumoniae NOT DETECTED NOT DETECTED   Proteus species NOT DETECTED NOT DETECTED   Serratia marcescens NOT DETECTED NOT DETECTED   Carbapenem resistance NOT DETECTED NOT DETECTED   Haemophilus influenzae NOT DETECTED NOT DETECTED   Neisseria meningitidis NOT DETECTED NOT DETECTED   Pseudomonas aeruginosa NOT DETECTED NOT DETECTED   Candida albicans NOT DETECTED NOT DETECTED   Candida glabrata NOT DETECTED NOT DETECTED   Candida krusei NOT DETECTED NOT DETECTED   Candida parapsilosis NOT DETECTED NOT DETECTED   Candida tropicalis NOT DETECTED NOT DETECTED   Romeo Rabon, PharmD. Mobile: [phone].  08/15/2018,6:26 PM.

## 2018-08-15 NOTE — Progress Notes (Signed)
Blood consent signed by daughter and placed in patient's chart.

## 2018-08-15 NOTE — Progress Notes (Signed)
  Vital Signs MEWS/VS Documentation      08/15/2018 0107 08/15/2018 0502 08/15/2018 1119 08/15/2018 1428   MEWS Score:  0  3  5  2    MEWS Score Color:  Green  Yellow  Red  Yellow   Resp:  18  20  -  20   Pulse:  79  (!) 110  -  77   BP:  108/63  (!) 100/53  -  (!) 114/55   Temp:  98.5 F (36.9 C)  (!) 100.7 F (38.2 C)  -  98.4 F (36.9 C)   O2 Device:  Room Air  Nasal Cannula  -  Room Air   O2 Flow Rate (L/min):  -  2 L/min  -  -   Level of Consciousness:  Alert  -  Responds to Pain  -           Rebecca A [place] 08/15/2018,6:45 PM

## 2018-08-15 NOTE — Progress Notes (Signed)
Patient too sleepy to administer PO synthroid.  Patient arousalable to voice.  MD notified via text page.

## 2018-08-15 NOTE — Progress Notes (Signed)
   08/15/18 1428  MEWS Score  Resp 20  Pulse Rate 77  BP (!) 114/55  Temp 98.4 F (36.9 C)  SpO2 97 %  O2 Device Room Air  MEWS Score  MEWS RR 0  MEWS Pulse 0  MEWS Systolic 0  MEWS LOC 2  MEWS Temp 0  MEWS Score 2  MEWS Score Color Yellow    Vital Signs MEWS/VS Documentation      08/15/2018 0107 08/15/2018 0502 08/15/2018 1119 08/15/2018 1428   MEWS Score:  0  3  5  2    MEWS Score Color:  Green  Yellow  Red  Yellow   Resp:  18  20  -  20   Pulse:  79  (!) 110  -  77   BP:  108/63  (!) 100/53  -  (!) 114/55   Temp:  98.5 F (36.9 C)  (!) 100.7 F (38.2 C)  -  98.4 F (36.9 C)   O2 Device:  Room Air  Nasal Cannula  -  Room Air   O2 Flow Rate (L/min):  -  2 L/min  -  -   Level of Consciousness:  Alert  -  Responds to Pain  -           Wells Guiles A [place] 08/15/2018,7:04 PM

## 2018-08-15 NOTE — Progress Notes (Addendum)
[facility] Progress Note  Patient: Brittany [name] QIH:[id number]   PCP: Blanchie Serve, MD DOB: [birth year]   DOA: 08/14/2018   DOS: 08/15/2018   Date of Service: the patient was seen and examined on 08/15/2018  Subjective: pt asleep after receiving benadryl this AM. No acute events  Brief hospital course: Pt. with PMH of vascular dementia, CVA, myelodysplastic syndrome, hypothyroidism, hypertension, CKD 3, iron deficiency anemia,HFpEF; admitted on 08/14/2018, presented with complaint of nausea and vomiting, was found to have UTI and pneumonia. Currently further plan is continue Antibiotics and await cultures .  Assessment and Plan: Sepsis due to UTI and aspiration pneumonia: possible source are UTI and pneumonia.   Patient with fever, tachycardia, leukocytosis and lactic acidosis on arrival.  Started on Vanco, aztreonam and Flagyl in ED. Status post 1 L LR Tachycardia and lactic acidosis resolved.   -Continue aztreonam, add doxycycline, stop vancomycin.  Patient with history of penicillin allergy. -Follow-up culture -D5-1/2NS@75  cc/hour Speech therapy consulted as well  Hypothyroidism -Continue home Synthroid  Iron deficiency anemia Myelodysplastic syndrome -Hemoglobin at baseline -CBC in afternoon as there is dilutional drop -Patient does have history of myelodysplastic syndrome, patient follows up with Dr. Alvy Bimler.  Per family patient was supposed to receive EPO injection coming Tuesday. H&H is relatively low, will recheck at 6 PM and transfuse for hemoglobin less than 7. Discussed with oncology, also recommend that people will not be effective given patient's sepsis picture.  Continue supportive management with blood transfusions.  AKI on CKD 3: Baseline creatinine 1.2 -IV fluid as above -Hold home losartan  we are rechecking BMP.  HFpEF: No echocardiogram in the chart. -No signs of fluid overload -Monitor respiratory status while on IV fluid  Dementia: patient only  oriented to self.  Not sure about baseline mental status. -Delirium precaution  Addendum: Blood culture is coming back positive for gram-negative rods.  Likely from UTI.  Possibly E. coli.  Continue IV aztreonam. Per pharmacy current dose is adequate given the renal function of the patient. Continue doxycycline to cover pneumonia.  AKI still present Check US renal and strict in and out.   Goals of care discussion. Daughter who is from [place] brought up a point regarding sending patient's DNR as per request from patient's another daughter who is currently in [place] Patient does have a DNR papers signed December 29, 2014.  There are also copies of same order available in epic. I requested family to bring paperwork regarding [place], healthcare proxy, living well which can assist me and deciding decision-making capacity for the family to alter the code status. Also d/w with POA son, who agrees to maintain the DNR DNI  Berle Mull 5:38 PM 08/15/2018    Diet: NPO DVT Prophylaxis: subcutaneous Heparin  Advance goals of care discussion: DNR DNI  Family Communication: no family was present at bedside, at the time of interview.   Disposition:  Discharge to home.  Consultants: none Procedures: none  Antibiotics: Anti-infectives (From admission, onward)   Start     Dose/Rate Route Frequency Ordered Stop   08/16/18 2200  vancomycin (VANCOCIN) 500 mg in sodium chloride 0.9 % 100 mL IVPB  Status:  Discontinued     500 mg 100 mL/hr over 60 Minutes Intravenous Every 48 hours 08/15/18 0534 08/15/18 1014   08/15/18 1100  doxycycline (VIBRAMYCIN) 100 mg in sodium chloride 0.9 % 250 mL IVPB     100 mg 125 mL/hr over 120 Minutes Intravenous Every 12 hours 08/15/18 1016  08/15/18 1000  aztreonam (AZACTAM) 1 g in sodium chloride 0.9 % 100 mL IVPB     1 g 200 mL/hr over 30 Minutes Intravenous Every 12 hours 08/15/18 0534     08/14/18 2000  aztreonam (AZACTAM) 2 g in sodium  chloride 0.9 % 100 mL IVPB     2 g 200 mL/hr over 30 Minutes Intravenous  Once 08/14/18 1959 08/14/18 2123   08/14/18 2000  metroNIDAZOLE (FLAGYL) IVPB 500 mg  Status:  Discontinued     500 mg 100 mL/hr over 60 Minutes Intravenous Every 8 hours 08/14/18 1959 08/15/18 0050   08/14/18 2000  vancomycin (VANCOCIN) IVPB 1000 mg/200 mL premix     1,000 mg 200 mL/hr over 60 Minutes Intravenous  Once 08/14/18 1959 08/14/18 2320       Objective: Physical Exam: Vitals:   08/14/18 2358 08/15/18 0038 08/15/18 0107 08/15/18 0502  BP: 100/78  108/63 (!) 100/53  Pulse: 87  79 (!) 110  Resp: 18  18 20   Temp:  97.8 F (36.6 C) 98.5 F (36.9 C) (!) 100.7 F (38.2 C)  TempSrc:  Oral Oral Oral  SpO2: 94%  96% 94%  Weight:    69.4 kg  Height:    4\' 11"  (1.499 m)   No intake or output data in the 24 hours ending 08/15/18 1143 Filed Weights   08/15/18 0502  Weight: 69.4 kg   General: drowsy. Appear in mild distress, affect difficult to assess  Eyes: PERRL, Conjunctiva normal ENT: Oral Mucosa clear moist. Neck: difficult to assess  JVD, no Abnormal Mass Or lumps Cardiovascular: S1 and S2 Present, aortic systolic Murmur, Peripheral Pulses Present Respiratory: normal respiratory effort, Bilateral Air entry equal and Decreased, no use of accessory muscle, Clear to Auscultation, no Crackles, no wheezes Abdomen: Bowel Sound present, Soft and no tenderness, no hernia Skin: no redness, no Rash, no induration Extremities: no Pedal edema, no calf tenderness Neurologic: Grossly no focal neuro deficit. Bilaterally Equal motor strength  Data Reviewed: CBC: Recent Labs  Lab 08/11/18 08/14/18 2033 08/15/18 0449  WBC 15.9 19.6* 16.3*  NEUTROABS  --  14.7*  --   HGB 9.1* 8.6* 7.4*  HCT 28* 26.1* 23.0*  MCV  --  84.5 85.2  PLT 706* 572* 742*   Basic Metabolic Panel: Recent Labs  Lab 08/14/18 2033 08/15/18 0449  NA 144 142  K 4.9 4.6  CL 109 110  CO2 21* 20*  GLUCOSE 152* 154*  BUN 29* 30*    CREATININE 1.56* 1.62*  CALCIUM 10.2 9.1    Liver Function Tests: Recent Labs  Lab 08/14/18 2033  AST 23  ALT 23  ALKPHOS 98  BILITOT 0.8  PROT 7.0  ALBUMIN 3.7   No results for input(s): LIPASE, AMYLASE in the last 168 hours. No results for input(s): AMMONIA in the last 168 hours. Coagulation Profile: Recent Labs  Lab 08/14/18 2033  INR 1.10   Cardiac Enzymes: No results for input(s): CKTOTAL, CKMB, CKMBINDEX, TROPONINI in the last 168 hours. BNP (last 3 results) No results for input(s): PROBNP in the last 8760 hours. CBG: Recent Labs  Lab 08/15/18 0745  GLUCAP 138*   Studies: Dg Chest 2 View  Result Date: 08/14/2018 CLINICAL DATA:  Patient with fever and vomiting.  Weakness. EXAM: CHEST - 2 VIEW COMPARISON:  None. FINDINGS: Monitoring leads overlie the patient. Enlarged cardiac and mediastinal contours. Thoracic aortic vascular calcifications. Low lung volumes. Bilateral perihilar interstitial pulmonary opacities. Patchy consolidation medial right lower hemithorax.  No pleural effusion or pneumothorax. Thoracic spine degenerative changes. Rightward curvature of the thoracic spine. IMPRESSION: Cardiomegaly. Patchy consolidation medial right lower hemithorax may represent pneumonia or aspiration. Followup PA and lateral chest X-ray is recommended in 3-4 weeks following trial of antibiotic therapy to ensure resolution and exclude underlying malignancy. Electronically Signed   By: Lovey Newcomer M.D.   On: 08/14/2018 21:02    Scheduled Meds: . cycloSPORINE  1 drop Both Eyes BID  . enoxaparin (LOVENOX) injection  30 mg Subcutaneous Q24H  . levothyroxine  88 mcg Oral QAC breakfast   Continuous Infusions: . aztreonam 1 g (08/15/18 1059)  . dextrose 5 % and 0.45% NaCl 75 mL/hr at 08/15/18 1058  . doxycycline (VIBRAMYCIN) IV     PRN Meds: acetaminophen, Glycerin-Hypromellose-PEG 400, polyethylene glycol  Time spent: 35 minutes  Author: Berle Mull, MD [facility] Pager: [phone] 08/15/2018 11:43 AM  If 7PM-7AM, please contact night-coverage at [email or web address], password [facility]

## 2018-08-15 NOTE — Evaluation (Signed)
Clinical/Bedside Swallow Evaluation Patient Details  Name: Brittany [name] MRN: [id number] Date of Birth: [birth year]  Today's Date: 08/15/2018 Time: SLP Start Time (ACUTE ONLY): 1257 SLP Stop Time (ACUTE ONLY): 1314 SLP Time Calculation (min) (ACUTE ONLY): 17 min  Past Medical History:  Past Medical History:  Diagnosis Date  . Blood transfusion 2006  . Colon polyps   . CVA (cerebral infarction) 2013  . Dementia   . Diverticulosis of colon   . DJD (degenerative joint disease)   . Hyperlipidemia   . Hypertension   . Hypothyroidism   . Melanoma ([place]) 03/20/15   removed with Moh's surgery  . Myelodysplasia 02/04/2012  . Normochromic normocytic anemia 02/04/2012  . Onychomycosis   . Osteoporosis   . Rectal prolapse   . Shoulder pain   . Unstable gait    Past Surgical History:  Past Surgical History:  Procedure Laterality Date  . APPENDECTOMY    . CARPAL TUNNEL RELEASE Bilateral 2006  . FLEXIBLE SIGMOIDOSCOPY  07/09/2012   Procedure: FLEXIBLE SIGMOIDOSCOPY;  Surgeon: Inda Castle, MD;  Location: [facility];  Service: Endoscopy;  Laterality: N/A;  . MELANOMA EXCISION Right 2005   arm  . POLYPECTOMY  2004   Laparoscopic  . RECTOCELE REPAIR    . REPLACEMENT TOTAL KNEE  2005 & 2006  . VAGINAL HYSTERECTOMY  1989   HPI:  84 y.o. female with medical history significant of vascular dementia, CVA, myelodysplastic syndrome, hypothyroidism, hypertension, CKD 3, iron deficiency anemia,HFpEF who presented from facility with generalized weakness and vomiting CXR indicated on 08/14/18 Patchy consolidation medial right lower hemithorax may represent pneumonia or aspiration.   Assessment / Plan / Recommendation Clinical Impression   Pt presents with oropharyngeal dysphagia characterized by oral holding/decreased oral manipulation/propulsion, immediate and delayed throat clearing/cough during consumption of ice chips, thin via cup/straw with small sips and puree consistencies; pt is at moderate  risk for aspiration d/t decreased mentation, hx of CVA, recent CXR results and overt s/s of aspiration noted during BSE; ST will f/u for PO readiness and possible objective assessment prn while in acute setting. SLP Visit Diagnosis: Dysphagia, oropharyngeal phase (R13.12)    Aspiration Risk  Moderate aspiration risk    Diet Recommendation   NPO  Medication Administration: Via alternative means and/or via crushed/puree if pt able without overt s/s of aspiration    Other  Recommendations Oral Care Recommendations: Oral care QID   Follow up Recommendations Skilled Nursing facility      Frequency and Duration min 2x/week  1 week       Prognosis Prognosis for Safe Diet Advancement: Fair Barriers to Reach Goals: Cognitive deficits      Swallow Study   General Date of Onset: 08/14/18 HPI: 84 y.o. female with medical history significant of vascular dementia, CVA, myelodysplastic syndrome, hypothyroidism, hypertension, CKD 3, iron deficiency anemia,HFpEF who presented from facility with generalized weakness and vomiting Type of Study: Bedside Swallow Evaluation Previous Swallow Assessment: (n/a) Diet Prior to this Study: NPO Temperature Spikes Noted: Yes Respiratory Status: Room air History of Recent Intubation: No Behavior/Cognition: Confused;Distractible;Requires cueing;Lethargic/Drowsy Oral Cavity Assessment: Dry Oral Care Completed by SLP: Other (Comment)(Pt refused) Oral Cavity - Dentition: Adequate natural dentition;Missing dentition Self-Feeding Abilities: Needs assist;Needs set up Patient Positioning: Upright in bed Baseline Vocal Quality: Low vocal intensity;Hoarse;Other (comment)(minimal) Volitional Cough: Strong Volitional Swallow: Able to elicit    Oral/Motor/Sensory Function Overall Oral Motor/Sensory Function: Generalized oral weakness   Ice Chips Ice chips: Impaired Presentation: Spoon Oral Phase Impairments: Reduced lingual  movement/coordination Oral Phase  Functional Implications: Prolonged oral transit;Oral holding Pharyngeal Phase Impairments: Suspected delayed Swallow;Cough - Delayed   Thin Liquid Thin Liquid: Impaired Presentation: Cup;Straw Pharyngeal  Phase Impairments: Suspected delayed Swallow;Cough - Immediate;Throat Clearing - Delayed    Nectar Thick Nectar Thick Liquid: Not tested   Honey Thick Honey Thick Liquid: Not tested   Puree Puree: Impaired Presentation: Spoon Oral Phase Impairments: Reduced lingual movement/coordination Oral Phase Functional Implications: Prolonged oral transit;Oral holding Pharyngeal Phase Impairments: Suspected delayed Swallow;Throat Clearing - Delayed;Cough - Delayed   Solid     Solid: Not tested      Elvina Sidle, M.S., CCC-SLP 08/15/2018,1:27 PM

## 2018-08-16 ENCOUNTER — Inpatient Hospital Stay (HOSPITAL_COMMUNITY): Payer: Medicare Other

## 2018-08-16 DIAGNOSIS — F0150 Vascular dementia without behavioral disturbance: Secondary | ICD-10-CM

## 2018-08-16 DIAGNOSIS — N179 Acute kidney failure, unspecified: Secondary | ICD-10-CM

## 2018-08-16 DIAGNOSIS — D469 Myelodysplastic syndrome, unspecified: Secondary | ICD-10-CM

## 2018-08-16 LAB — BASIC METABOLIC PANEL
Anion gap: 10 (ref 5–15)
BUN: 27 mg/dL — AB (ref 8–23)
CALCIUM: 8.9 mg/dL (ref 8.9–10.3)
CO2: 21 mmol/L — ABNORMAL LOW (ref 22–32)
CREATININE: 1.28 mg/dL — AB (ref 0.44–1.00)
Chloride: 113 mmol/L — ABNORMAL HIGH (ref 98–111)
GFR calc Af Amer: 41 mL/min — ABNORMAL LOW (ref >60)
GFR, EST NON AFRICAN AMERICAN: 35 mL/min — AB (ref >60)
GLUCOSE: 125 mg/dL — AB (ref 70–99)
Potassium: 4.3 mmol/L (ref 3.5–5.1)
SODIUM: 144 mmol/L (ref 135–145)

## 2018-08-16 LAB — CBC
HCT: 26.6 % — ABNORMAL LOW (ref 36.0–46.0)
Hemoglobin: 8.8 g/dL — ABNORMAL LOW (ref 12.0–15.0)
MCH: 28.1 pg (ref 26.0–34.0)
MCHC: 33.1 g/dL (ref 30.0–36.0)
MCV: 85 fL (ref 78.0–100.0)
PLATELETS: 384 10*3/uL (ref 150–400)
RBC: 3.13 MIL/uL — ABNORMAL LOW (ref 3.87–5.11)
RDW: 16.7 % — AB (ref 11.5–15.5)
WBC: 12 10*3/uL — ABNORMAL HIGH (ref 4.0–10.5)

## 2018-08-16 LAB — MAGNESIUM: MAGNESIUM: 1.6 mg/dL — AB (ref 1.7–2.4)

## 2018-08-16 MED ORDER — ACETAMINOPHEN 650 MG RE SUPP: 650.0000 mg | RECTAL | Status: DC | PRN

## 2018-08-16 MED ORDER — MAGNESIUM SULFATE 2 GM/50ML IV SOLN
2.0000 g | Freq: Once | INTRAVENOUS | Status: AC
  Administered 2018-08-16: 2 g via INTRAVENOUS
  Filled 2018-08-16: qty 50

## 2018-08-16 MED ORDER — LIP MEDEX EX OINT
TOPICAL_OINTMENT | CUTANEOUS | Status: DC | PRN
  Administered 2018-08-16: 11:00:00 via TOPICAL
  Filled 2018-08-16: qty 7

## 2018-08-16 MED ORDER — DM-GUAIFENESIN ER 30-600 MG PO TB12: 1.0000 | ORAL_TABLET | Freq: Two times a day (BID) | ORAL | Status: DC

## 2018-08-16 MED ORDER — ALBUTEROL SULFATE (2.5 MG/3ML) 0.083% IN NEBU
2.5000 mg | INHALATION_SOLUTION | RESPIRATORY_TRACT | Status: DC | PRN
  Administered 2018-08-17: 2.5 mg via RESPIRATORY_TRACT
  Filled 2018-08-16: qty 3

## 2018-08-16 MED ORDER — GUAIFENESIN ER 600 MG PO TB12
600.0000 mg | ORAL_TABLET | Freq: Two times a day (BID) | ORAL | Status: DC
  Administered 2018-08-16 – 2018-08-18 (×5): 600 mg via ORAL
  Filled 2018-08-16 (×5): qty 1

## 2018-08-16 NOTE — Progress Notes (Signed)
[facility] NOTE  Patient Care Team: Blanchie Serve, MD as PCP - General (Internal Medicine) Milus Banister, MD as Consulting Physician (Gastroenterology) Heath Lark, MD as Consulting Physician (Hematology and Oncology) Mast, Man X, NP as Nurse Practitioner (Internal Medicine)   ASSESSMENT & PLAN:   Multifactorial anemia She has anemia likely related to sepsis, myelodysplastic syndrome and anemia chronic renal failure Currently, I do not recommend restarting darbepoetin injection I will order serum erythropoietin level tomorrow I recommend blood transfusion to keep hemoglobin greater than 8  Leukocytosis, bacteremia/sepsis She is improving on broad-spectrum intravenous antibiotic therapy Continue the same  Acute on chronic renal failure Likely due to recent infection It is improving Ultrasound renal is pending  Moderate dementia No signs of delirium  Discharge planning She is not ready to be discharged, and likely will be here the next 48 to 72 hours I will continue to follow. I have called and updated her son.     Heath Lark, MD 08/16/2018 9:19 AM  CHIEF COMPLAINTS/PURPOSE OF CONSULTATION:  Chronic anemia, on background history of myelodysplastic syndrome and anemia of chronic renal failure, for further management  HISTORY OF PRESENTING ILLNESS:  Brittany [name] 84 y.o. female is well-known to me.  She was supposed to be seen last week but did not show up to the outpatient appointment.  She was rescheduled to see me in 2 days in the outpatient clinic.  This is a pleasant lady with a myeloproliferative disorder with dysplastic features. She was diagnosed in May of 2008 when she presented with unexplained normochromic anemia. Bone marrow aspiration and biopsy was done on 04/23/2007. Marrow was hypercellular for age 16-80% Maturation was normal in all 3 cell lines and there were only 1% blasts. Cytogenetics were normal including FISH studies to look at  chromosome 5 and chromosome 7 deletions which were not present. Hemoglobin was 9.7 at the time with MCV 90, [place] count 4700, and platelets 162,000. There were no ringed sideroblasts.  She was initially followed with observation alone until hemoglobin fell to 8.6 by June of 2010. She was started on a trial of Aranesp and had a nice response with rise in hemoglobin up to 12.5 g. The patient is noted to have dementia. She has not received any injection for many months as her hemoglobin has been elevated at greater than 10 g. She is not symptomatic. The last Aranesp injection was on 10/04/2014.  She was last seen in my office in August 2016 and has not been receiving Aranesp injection due to stable hemoglobin.  I received a referral back from his primary care doctor when her hemoglobin started to trend lower.  The patient was admitted to the hospital on 08/14/2018 after presentation with vomiting and high-grade fever.  She was noted to have acute on chronic renal failure, leukocytosis and urinalysis suspicious for urinary tract infection. Chest x-ray also show abnormalities worrisome for pneumonia/aspiration. She was started on broad-spectrum intravenous antibiotics. Since admission, blood culture came back positive for E. Coli. She was noted to have progression of anemia and received 1 unit of blood transfusion yesterday.  Repeat blood test today showed improvement of leukocytosis and anemia.  The patient is intermittently alert.  She appears comfortable.  She denies pain. The patient denies any recent signs or symptoms of bleeding such as spontaneous epistaxis, hematuria or hematochezia.   MEDICAL HISTORY:  Past Medical History:  Diagnosis Date  . Blood transfusion 2006  . Colon polyps   . CVA (  cerebral infarction) 2013  . Dementia   . Diverticulosis of colon   . DJD (degenerative joint disease)   . Hyperlipidemia   . Hypertension   . Hypothyroidism   . Melanoma ([place]) 03/20/15   removed with  Moh's surgery  . Myelodysplasia 02/04/2012  . Normochromic normocytic anemia 02/04/2012  . Onychomycosis   . Osteoporosis   . Rectal prolapse   . Shoulder pain   . Unstable gait     SURGICAL HISTORY: Past Surgical History:  Procedure Laterality Date  . APPENDECTOMY    . CARPAL TUNNEL RELEASE Bilateral 2006  . FLEXIBLE SIGMOIDOSCOPY  07/09/2012   Procedure: FLEXIBLE SIGMOIDOSCOPY;  Surgeon: Inda Castle, MD;  Location: [facility];  Service: Endoscopy;  Laterality: N/A;  . MELANOMA EXCISION Right 2005   arm  . POLYPECTOMY  2004   Laparoscopic  . RECTOCELE REPAIR    . REPLACEMENT TOTAL KNEE  2005 & 2006  . VAGINAL HYSTERECTOMY  1989    SOCIAL HISTORY: Social History   Socioeconomic History  . Marital status: Married    Spouse name: Not on file  . Number of children: Not on file  . Years of education: Not on file  . Highest education level: Not on file  Occupational History  . Occupation: English as a second language teacher  Social Needs  . Financial resource strain: Not hard at all  . Food insecurity:    Worry: Never true    Inability: Never true  . Transportation needs:    Medical: No    Non-medical: No  Tobacco Use  . Smoking status: Never Smoker  . Smokeless tobacco: Never Used  Substance and Sexual Activity  . Alcohol use: No  . Drug use: No  . Sexual activity: Yes    Birth control/protection: Surgical    Comment: HYST  Lifestyle  . Physical activity:    Days per week: 0 days    Minutes per session: 0 min  . Stress: Not on file  Relationships  . Social connections:    Talks on phone: Not on file    Gets together: Not on file    Attends religious service: Not on file    Active member of club or organization: Not on file    Attends meetings of clubs or organizations: Not on file    Relationship status: Not on file  . Intimate partner violence:    Fear of current or ex partner: Not on file    Emotionally abused: Not on file    Physically abused: Not on file    Forced  sexual activity: Not on file  Other Topics Concern  . Not on file  Social History Narrative   Lives at [facility], moved to [place] 12/28/14   married   Never smoked   Alcohol none   Caffeine Coffee 1 cup   Exercise none    FAMILY HISTORY: Family History  Problem Relation Age of Onset  . Prostate cancer Brother   . Cancer Brother        renal cell carcinoma  . Heart disease Brother   . Breast cancer Mother 89  . Cancer Father        unknown kind  . Arthritis Daughter     ALLERGIES:  is allergic to lisinopril and penicillins.  MEDICATIONS:  Current Facility-Administered Medications  Medication Dose Route Frequency Provider Last Rate Last Dose  . 0.9 %  sodium chloride infusion (Manually program via Guardrails IV Fluids)   Intravenous Once Berle Mull  M, MD      . acetaminophen (TYLENOL) suppository 650 mg  650 mg Rectal Q4H PRN Lavina Hamman, MD      . acetaminophen (TYLENOL) tablet 650 mg  650 mg Oral Q6H PRN Lovey Newcomer T, NP   650 mg at 08/15/18 0618  . aztreonam (AZACTAM) 1 g in sodium chloride 0.9 % 100 mL IVPB  1 g Intravenous Q12H Jani Gravel, MD 200 mL/hr at 08/15/18 2345 1 g at 08/15/18 2345  . cycloSPORINE (RESTASIS) 0.05 % ophthalmic emulsion 1 drop  1 drop Both Eyes BID Wendee Beavers T, MD   1 drop at 08/15/18 1101  . dextrose 5 %-0.45 % sodium chloride infusion   Intravenous Continuous Lavina Hamman, MD 75 mL/hr at 08/16/18 0732    . doxycycline (VIBRAMYCIN) 100 mg in sodium chloride 0.9 % 250 mL IVPB  100 mg Intravenous Q12H Lavina Hamman, MD 125 mL/hr at 08/16/18 0046 100 mg at 08/16/18 0046  . enoxaparin (LOVENOX) injection 30 mg  30 mg Subcutaneous Q24H Gonfa, Taye T, MD   30 mg at 08/15/18 1100  . Glycerin-Hypromellose-PEG 400 0.2-0.2-1 % SOLN 1 drop  1 drop Both Eyes Q2H PRN Gonfa, Taye T, MD      . haloperidol lactate (HALDOL) injection 2 mg  2 mg Intravenous Q6H PRN Lavina Hamman, MD      . polyethylene glycol (MIRALAX / GLYCOLAX) packet 17 g   17 g Oral Daily PRN Mercy Riding, MD        REVIEW OF SYSTEMS:   Constitutional: Denies fevers, chills or abnormal night sweats Eyes: Denies blurriness of vision, double vision or watery eyes Ears, nose, mouth, throat, and face: Denies mucositis or sore throat Respiratory: Denies cough, dyspnea or wheezes Cardiovascular: Denies palpitation, chest discomfort or lower extremity swelling Skin: Denies abnormal skin rashes Lymphatics: Denies new lymphadenopathy or easy bruising Neurological:Denies numbness, tingling or new weaknesses Behavioral/Psych: Mood is stable, no new changes  All other systems were reviewed with the patient and are negative.  PHYSICAL EXAMINATION: ECOG PERFORMANCE STATUS: 2 - Symptomatic, <50% confined to bed  Vitals:   08/16/18 0505 08/16/18 0630  BP: (!) 144/77   Pulse: 88 80  Resp: (!) 22 (!) 22  Temp: 98.5 F (36.9 C) 99.9 F (37.7 C)  SpO2: 98% 96%   Filed Weights   08/15/18 0502  Weight: 153 lb (69.4 kg)    GENERAL: She appears sleepy but arousable and appears comfortable SKIN: skin color is pale, texture, turgor are normal, no rashes or significant lesions EYES: normal, conjunctiva are pale and non-injected, sclera clear OROPHARYNX:no exudate, no erythema and lips, buccal mucosa, and tongue normal  NECK: supple, thyroid normal size, non-tender, without nodularity LYMPH:  no palpable lymphadenopathy in the cervical, axillary or inguinal LUNGS: Reduced breath sounds on both lung bases HEART: regular rate & rhythm and no murmurs and no lower extremity edema ABDOMEN:abdomen soft, non-tender and normal bowel sounds Musculoskeletal:no cyanosis of digits and no clubbing  PSYCH: alert & oriented x 3 with fluent speech NEURO: no focal motor/sensory deficits  RADIOGRAPHIC STUDIES: I have personally reviewed the radiological images as listed and agreed with the findings in the report. Dg Chest 2 View  Result Date: 08/14/2018 CLINICAL DATA:  Patient  with fever and vomiting.  Weakness. EXAM: CHEST - 2 VIEW COMPARISON:  None. FINDINGS: Monitoring leads overlie the patient. Enlarged cardiac and mediastinal contours. Thoracic aortic vascular calcifications. Low lung volumes. Bilateral perihilar interstitial pulmonary opacities.  Patchy consolidation medial right lower hemithorax. No pleural effusion or pneumothorax. Thoracic spine degenerative changes. Rightward curvature of the thoracic spine. IMPRESSION: Cardiomegaly. Patchy consolidation medial right lower hemithorax may represent pneumonia or aspiration. Followup PA and lateral chest X-ray is recommended in 3-4 weeks following trial of antibiotic therapy to ensure resolution and exclude underlying malignancy. Electronically Signed   By: Lovey Newcomer M.D.   On: 08/14/2018 21:02   Ct Head Wo Contrast  Result Date: 08/15/2018 CLINICAL DATA:  Altered level of consciousness. EXAM: CT HEAD WITHOUT CONTRAST TECHNIQUE: Contiguous axial images were obtained from the base of the skull through the vertex without intravenous contrast. COMPARISON:  None. FINDINGS: Brain: Image quality degraded by motion. Study was repeated with better results on the second attempt Moderate atrophy. Patchy hypodensity in the cerebral [place] matter bilaterally appears chronic. No acute infarct, hemorrhage, mass Vascular: Negative for hyperdense vessel Skull: Negative Sinuses/Orbits: Mild mucosal edema sphenoid sinus. Orbits not adequately imaged. Other: None IMPRESSION: Moderate atrophy with mild chronic microvascular ischemia. No acute abnormality. Electronically Signed   By: Franchot Gallo M.D.   On: 08/15/2018 15:00

## 2018-08-16 NOTE — Progress Notes (Signed)
  Speech Language Pathology Treatment: Dysphagia  Patient Details Name: Tiombe Tomeo MRN: [id number] DOB: Feb 09, 1924 Today's Date: 08/16/2018 Time: 6160-7371 SLP Time Calculation (min) (ACUTE ONLY): 13 min  Assessment / Plan / Recommendation Clinical Impression  Pt demonstrates normal mentation and arousal today, no signs of aspiration with thin or regular textures. Will resume diet and sign off.   HPI HPI: 84 y.o. female with medical history significant of vascular dementia, CVA, myelodysplastic syndrome, hypothyroidism, hypertension, CKD 3, iron deficiency anemia,HFpEF who presented from facility with generalized weakness and vomiting      SLP Plan  Continue with current plan of care       Recommendations  Diet recommendations: Regular;Thin liquid Liquids provided via: Cup;Straw Medication Administration: Whole meds with liquid Supervision: Patient able to self feed Compensations: Slow rate;Small sips/bites Postural Changes and/or Swallow Maneuvers: Seated upright 90 degrees                Oral Care Recommendations: Oral care BID Follow up Recommendations: 24 hour supervision/assistance SLP Visit Diagnosis: Dysphagia, unspecified (R13.10) Plan: Continue with current plan of care       GO                Roselinda Bahena, Katherene Ponto 08/16/2018, 1:08 PM

## 2018-08-16 NOTE — Progress Notes (Signed)
[facility] Progress Note  Patient: Brittany [name] KXF:[id number]   PCP: Blanchie Serve, MD DOB: 15-Dec-1924   DOA: 08/14/2018   DOS: 08/16/2018   Date of Service: the patient was seen and examined on 08/16/2018  Subjective: Awake and oriented this morning.  No acute complaint.  No nausea no vomiting.  No fever no chills. Reports no abdominal pain as well.  Brief hospital course: Pt. with PMH of vascular dementia, CVA, myelodysplastic syndrome, hypothyroidism, hypertension, CKD 3, iron deficiency anemia,HFpEF; admitted on 08/14/2018, presented with complaint of nausea and vomiting, was found to have UTI and pneumonia. Currently further plan is continue Antibiotics and await cultures .  Assessment and Plan: Sepsis due to UTI and aspiration pneumonia: possible source are UTI and pneumonia.   Patient with fever, tachycardia, leukocytosis and lactic acidosis on arrival.  Started on Vanco, aztreonam and Flagyl in ED. Status post 1 L LR Tachycardia and lactic acidosis resolved.   -Continue aztreonam, add doxycycline, stop vancomycin.  Patient with history of penicillin allergy. -Blood culture is positive for E. coli.  Sensitivities are currently pending although given her penicillin allergies option would be limited. -D5-1/2NS@75  cc/hour Speech therapy consulted as well  Hypothyroidism -Continue home Synthroid  Iron deficiency anemia Myelodysplastic syndrome -Hemoglobin at baseline -CBC in afternoon as there is dilutional drop -Patient does have history of myelodysplastic syndrome, patient follows up with Dr. Alvy Bimler.  Per family patient was supposed to receive EPO injection coming Tuesday. H&H dropped to 7.3 but remained stable at that level.  Patient was transfused 1 unit on 08/16/2018. Posttransfusion H&H is 8.8, continue to transfuse for hemoglobin less than 8. Discussed with oncology, also recommend that EPO will not be effective given patient's sepsis picture.  Continue supportive  management with blood transfusions.  AKI on CKD 3: Baseline creatinine 1.2 -IV fluid as above -Hold home losartan   renal function improved after blood transfusion.  Ultrasound renal negative for any obstruction or stone.  No hydronephrosis noted as well.  HFpEF: No echocardiogram in the chart. -No signs of fluid overload -Monitor respiratory status while on IV fluid  Dementia: patient only oriented to self.  Not sure about baseline mental status. -Delirium precaution  Goals of care discussion. Daughter who is from [place] brought up a point regarding sending patient's DNR as per request from patient's another daughter who is currently in [place] Patient does have a DNR papers signed December 29, 2014.  There are also copies of same order available in epic. I requested family to bring paperwork regarding [place], healthcare proxy, living well which can assist me and deciding decision-making capacity for the family to alter the code status. Also d/w with POA son, who agrees to maintain the DNR DNI  Diet: NPO awaiting speech therapy consult DVT Prophylaxis: subcutaneous Heparin  Advance goals of care discussion: DNR DNI  Family Communication: no family was present at bedside, at the time of interview.   Disposition:  Discharge to SNF, PT OT consulted.  Consultants: Oncology Procedures: none  Antibiotics: Anti-infectives (From admission, onward)   Start     Dose/Rate Route Frequency Ordered Stop   08/16/18 2200  vancomycin (VANCOCIN) 500 mg in sodium chloride 0.9 % 100 mL IVPB  Status:  Discontinued     500 mg 100 mL/hr over 60 Minutes Intravenous Every 48 hours 08/15/18 0534 08/15/18 1014   08/15/18 1100  doxycycline (VIBRAMYCIN) 100 mg in sodium chloride 0.9 % 250 mL IVPB     100 mg 125  mL/hr over 120 Minutes Intravenous Every 12 hours 08/15/18 1016     08/15/18 1000  aztreonam (AZACTAM) 1 g in sodium chloride 0.9 % 100 mL IVPB     1 g 200 mL/hr over 30  Minutes Intravenous Every 12 hours 08/15/18 0534     08/14/18 2000  aztreonam (AZACTAM) 2 g in sodium chloride 0.9 % 100 mL IVPB     2 g 200 mL/hr over 30 Minutes Intravenous  Once 08/14/18 1959 08/14/18 2123   08/14/18 2000  metroNIDAZOLE (FLAGYL) IVPB 500 mg  Status:  Discontinued     500 mg 100 mL/hr over 60 Minutes Intravenous Every 8 hours 08/14/18 1959 08/15/18 0050   08/14/18 2000  vancomycin (VANCOCIN) IVPB 1000 mg/200 mL premix     1,000 mg 200 mL/hr over 60 Minutes Intravenous  Once 08/14/18 1959 08/14/18 2320       Objective: Physical Exam: Vitals:   08/16/18 0313 08/16/18 0329 08/16/18 0505 08/16/18 0630  BP: (!) 156/62 (!) 136/55 (!) 144/77   Pulse: 98 89 88 80  Resp:  20 (!) 22 (!) 22  Temp:  (!) 100.8 F (38.2 C) 98.5 F (36.9 C) 99.9 F (37.7 C)  TempSrc:  Oral Oral Oral  SpO2:  97% 98% 96%  Weight:      Height:        Intake/Output Summary (Last 24 hours) at 08/16/2018 1117 Last data filed at 08/16/2018 3546 Gross per 24 hour  Intake 1688.16 ml  Output 302 ml  Net 1386.16 ml   Filed Weights   08/15/18 0502  Weight: 69.4 kg   General: Alert awake and oriented x3. Appear in mild distress, affect appropriate Eyes: PERRL, Conjunctiva normal ENT: Oral Mucosa clear moist. Neck: difficult to assess  JVD, no Abnormal Mass Or lumps Cardiovascular: S1 and S2 Present, aortic systolic Murmur, Peripheral Pulses Present Respiratory: normal respiratory effort, Bilateral Air entry equal and Decreased, no use of accessory muscle, Clear to Auscultation, no Crackles, no wheezes Abdomen: Bowel Sound present, Soft and no tenderness, no hernia Skin: no redness, no Rash, no induration Extremities: no Pedal edema, no calf tenderness Neurologic: Grossly no focal neuro deficit. Bilaterally Equal motor strength  Data Reviewed: CBC: Recent Labs  Lab 08/11/18 08/14/18 2033 08/15/18 0449 08/15/18 1815 08/16/18 0941  WBC 15.9 19.6* 16.3* 18.4* 12.0*  NEUTROABS  --  14.7*   --   --   --   HGB 9.1* 8.6* 7.4* 7.3* 8.8*  HCT 28* 26.1* 23.0* 22.8* 26.6*  MCV  --  84.5 85.2 84.8 85.0  PLT 706* 572* 425* 418* 568   Basic Metabolic Panel: Recent Labs  Lab 08/14/18 2033 08/15/18 0449 08/15/18 1815 08/16/18 0941  NA 144 142 143 144  K 4.9 4.6 4.6 4.3  CL 109 110 112* 113*  CO2 21* 20* 23 21*  GLUCOSE 152* 154* 129* 125*  BUN 29* 30* 36* 27*  CREATININE 1.56* 1.62* 1.72* 1.28*  CALCIUM 10.2 9.1 9.1 8.9  MG  --   --   --  1.6*    Liver Function Tests: Recent Labs  Lab 08/14/18 2033  AST 23  ALT 23  ALKPHOS 98  BILITOT 0.8  PROT 7.0  ALBUMIN 3.7   No results for input(s): LIPASE, AMYLASE in the last 168 hours. No results for input(s): AMMONIA in the last 168 hours. Coagulation Profile: Recent Labs  Lab 08/14/18 2033  INR 1.10   Cardiac Enzymes: No results for input(s): CKTOTAL, CKMB, CKMBINDEX, TROPONINI in  the last 168 hours. BNP (last 3 results) No results for input(s): PROBNP in the last 8760 hours. CBG: Recent Labs  Lab 08/15/18 0745  GLUCAP 138*   Studies: Ct Head Wo Contrast  Result Date: 08/15/2018 CLINICAL DATA:  Altered level of consciousness. EXAM: CT HEAD WITHOUT CONTRAST TECHNIQUE: Contiguous axial images were obtained from the base of the skull through the vertex without intravenous contrast. COMPARISON:  None. FINDINGS: Brain: Image quality degraded by motion. Study was repeated with better results on the second attempt Moderate atrophy. Patchy hypodensity in the cerebral [place] matter bilaterally appears chronic. No acute infarct, hemorrhage, mass Vascular: Negative for hyperdense vessel Skull: Negative Sinuses/Orbits: Mild mucosal edema sphenoid sinus. Orbits not adequately imaged. Other: None IMPRESSION: Moderate atrophy with mild chronic microvascular ischemia. No acute abnormality. Electronically Signed   By: Franchot Gallo M.D.   On: 08/15/2018 15:00   US Renal  Result Date: 08/16/2018 CLINICAL DATA:  84 year old female  with acute kidney injury EXAM: RENAL / URINARY TRACT ULTRASOUND COMPLETE COMPARISON:  Prior CT abdomen/pelvis 07/08/2012 FINDINGS: Right Kidney: Length: 8.6 cm. Echogenicity within normal limits. No solid mass or hydronephrosis visualized. Small subcentimeter anechoic simple cyst at the lower pole. Left Kidney: Length: 10.5 cm. Echogenicity within normal limits. No solid mass or hydronephrosis visualized. Multiple anechoic cystic structures with posterior acoustic enhancement and imperceptible walls consistent with simple cysts arising from the lower pole of the left kidney. The largest measures 2.4 x 1.4 x 1.9 cm. Bladder: Appears normal for degree of bladder distention. IMPRESSION: 1. No evidence of hydronephrosis or other acute abnormality. 2. Bilateral simple renal cysts. Electronically Signed   By: Jacqulynn Cadet M.D.   On: 08/16/2018 09:51    Scheduled Meds: . sodium chloride   Intravenous Once  . cycloSPORINE  1 drop Both Eyes BID  . enoxaparin (LOVENOX) injection  30 mg Subcutaneous Q24H   Continuous Infusions: . aztreonam 1 g (08/16/18 1005)  . dextrose 5 % and 0.45% NaCl 75 mL/hr at 08/16/18 0732  . doxycycline (VIBRAMYCIN) IV 100 mg (08/16/18 0046)   PRN Meds: acetaminophen, acetaminophen, Glycerin-Hypromellose-PEG 400, haloperidol lactate, lip balm, polyethylene glycol  Time spent: 35 minutes  Author: Berle Mull, MD [facility] Pager: [phone] 08/16/2018 11:17 AM  If 7PM-7AM, please contact night-coverage at [email or web address], password [facility]

## 2018-08-17 LAB — BPAM RBC
Blood Product Expiration Date: 201910052359
ISSUE DATE / TIME: 201909010256
Unit Type and Rh: 5100

## 2018-08-17 LAB — TYPE AND SCREEN
ABO/RH(D): O POS
Antibody Screen: NEGATIVE
Unit division: 0

## 2018-08-17 LAB — URINE CULTURE: Culture: >=100000 — AB

## 2018-08-17 LAB — BASIC METABOLIC PANEL
ANION GAP: 7 (ref 5–15)
BUN: 22 mg/dL (ref 8–23)
CHLORIDE: 114 mmol/L — AB (ref 98–111)
CO2: 22 mmol/L (ref 22–32)
Calcium: 8.8 mg/dL — ABNORMAL LOW (ref 8.9–10.3)
Creatinine, Ser: 1.13 mg/dL — ABNORMAL HIGH (ref 0.44–1.00)
GFR calc Af Amer: 47 mL/min — ABNORMAL LOW (ref >60)
GFR calc non Af Amer: 41 mL/min — ABNORMAL LOW (ref >60)
GLUCOSE: 104 mg/dL — AB (ref 70–99)
POTASSIUM: 4.2 mmol/L (ref 3.5–5.1)
Sodium: 143 mmol/L (ref 135–145)

## 2018-08-17 LAB — CULTURE, BLOOD (ROUTINE X 2): Special Requests: ADEQUATE

## 2018-08-17 LAB — CBC
HEMATOCRIT: 26.3 % — AB (ref 36.0–46.0)
HEMOGLOBIN: 8.5 g/dL — AB (ref 12.0–15.0)
MCH: 27.6 pg (ref 26.0–34.0)
MCHC: 32.3 g/dL (ref 30.0–36.0)
MCV: 85.4 fL (ref 78.0–100.0)
Platelets: 363 10*3/uL (ref 150–400)
RBC: 3.08 MIL/uL — ABNORMAL LOW (ref 3.87–5.11)
RDW: 16.9 % — AB (ref 11.5–15.5)
WBC: 7.2 10*3/uL (ref 4.0–10.5)

## 2018-08-17 MED ORDER — ENSURE ENLIVE PO LIQD
Freq: Two times a day (BID) | ORAL | Status: DC
  Administered 2018-08-17: 15:00:00 via ORAL

## 2018-08-17 MED ORDER — DONEPEZIL HCL 10 MG PO TABS
10.0000 mg | ORAL_TABLET | Freq: Every day | ORAL | Status: DC
  Administered 2018-08-17 – 2018-08-18 (×2): 10 mg via ORAL
  Filled 2018-08-17 (×2): qty 1

## 2018-08-17 MED ORDER — LOSARTAN POTASSIUM 50 MG PO TABS
25.0000 mg | ORAL_TABLET | Freq: Every day | ORAL | Status: DC
  Administered 2018-08-17 – 2018-08-18 (×2): 25 mg via ORAL
  Filled 2018-08-17 (×2): qty 1

## 2018-08-17 MED ORDER — SODIUM CHLORIDE 0.9 % IV SOLN
1.0000 g | Freq: Three times a day (TID) | INTRAVENOUS | Status: DC
  Administered 2018-08-17 – 2018-08-18 (×4): 1 g via INTRAVENOUS
  Filled 2018-08-17 (×5): qty 1

## 2018-08-17 MED ORDER — SODIUM CHLORIDE 0.9 % IV SOLN
INTRAVENOUS | Status: DC | PRN
  Administered 2018-08-17: 250 mL via INTRAVENOUS

## 2018-08-17 MED ORDER — ASPIRIN EC 81 MG PO TBEC
81.0000 mg | DELAYED_RELEASE_TABLET | Freq: Every day | ORAL | Status: DC
  Administered 2018-08-17 – 2018-08-18 (×2): 81 mg via ORAL
  Filled 2018-08-17 (×2): qty 1

## 2018-08-17 MED ORDER — LEVOTHYROXINE SODIUM 88 MCG PO TABS
88.0000 ug | ORAL_TABLET | Freq: Every day | ORAL | Status: DC
  Administered 2018-08-17 – 2018-08-18 (×2): 88 ug via ORAL
  Filled 2018-08-17 (×2): qty 1

## 2018-08-17 NOTE — Progress Notes (Signed)
Pharmacy Antibiotic Note  Brittany [name] is a 84 y.o. female admitted on 08/14/2018 with sepsis.  Pharmacy has been consulted for Vancomycin, aztreonam dosing. Vanc changed to doxy per Md on 8/31. Patient now with Ecoli bacteremia and UTI  Plan: Day 3 Abxs Due to improved renal function, change aztreonam from 1g IV q12 to 1g IV q8  Height: 4\' 11"  (149.9 cm) Weight: 153 lb (69.4 kg) IBW/kg (Calculated) : 43.2  Temp (24hrs), Avg:98.4 F (36.9 C), Min:98 F (36.7 C), Max:98.7 F (37.1 C)  Recent Labs  Lab 08/14/18 2031 08/14/18 2033 08/14/18 2211 08/15/18 0449 08/15/18 1815 08/16/18 0941 08/17/18 0523  WBC  --  19.6*  --  16.3* 18.4* 12.0* 7.2  CREATININE  --  1.56*  --  1.62* 1.72* 1.28* 1.13*  LATICACIDVEN 2.49*  --  1.05  --   --   --   --     Estimated Creatinine Clearance: 26.9 mL/min (A) (by C-G formula based on SCr of 1.13 mg/dL (H)).    Allergies  Allergen Reactions  . Lisinopril     REACTION: angioedema  . Penicillins     REACTION: rash    Antimicrobials this admission: Vancomycin 08/14/2018 >> 8/31 Aztreonam 08/14/2018 >>  8/31 doxy >>  Dose adjustments this admission: -  Microbiology results: 8/30 BCx: GNR -> BCID E.coli 8/30 UCx: Ecoli 8/31 MRSA PCR: negative 9/1 blood: ngtd  Thank you for allowing pharmacy to be a part of this patient's care.  Kara Mead 08/17/2018 7:22 AM

## 2018-08-17 NOTE — Care Management Note (Signed)
Case Management Note  Patient Details  Name: Brittany [name] MRN: [id number] Date of Birth: [birth year]  Subjective/Objective:                  Gi notAssessment & Plan:  Multifactorial anemia She has anemia likely related to sepsis, myelodysplastic syndrome and anemia chronic renal failure Currently, I do not recommend restarting darbepoetin injection I have ordered serum erythropoietin level, result is pending I recommend blood transfusion to keep hemoglobin greater than 8 No need blood today  Leukocytosis, bacteremia/sepsis She is improving on broad-spectrum intravenous antibiotic therapy Continue the same, repeat cultures are pending  Acute on chronic renal failure, improving Likely due to recent infection It is improving Ultrasound renal is ok without obstruction  Moderate dementia No signs of delirium  Discharge planning She is not ready to be discharged, and likely will be here the next 48 hours I will continue to follow.e:   Action/Plan: Goal Length of Stay: Ambulatory or 3 days  Note: Goal Length of Stay assumes optimal recovery, decision making, and care. Patients may be discharged to a lower level of care (either later than or sooner than the goal) when it is appropriate for their clinical status and care needs. Discharge Readiness Return to top of Sepsis and Other Febrile Illness, without Focal Infection RRG - [place]  Discharge readiness is indicated by patient meeting Recovery Milestones, including ALL of the following: ? Hemodynamic stability stable ? Fever absent or reduced=absent ? Hypoxemia absent=absnet ? Mental status at baseline at baseline ? End-organ dysfunction (eg, myocardial ischemia, renal failure) absent ? Metabolic derangement (eg, dehydration, acidosis) absent ? Cultures negative or infection identified and under adequate treatment/ bld and urine cultures are positive for e.coli ? Ambulatory no ? Oral hydration, medications[P] ? Iv azactam/iv  vibramycin ? Discharge plans and education understood not ready for dc at this time/   Expected Discharge Date:  (unknown)               Expected Discharge Plan:  Assisted Living / Rest Home  In-House Referral:  Clinical Social Work  Discharge planning Services  CM Consult  Post Acute Care Choice:    Choice offered to:     DME Arranged:    DME Agency:     HH Arranged:    [place] Agency:     Status of Service:  In process, will continue to follow  If discussed at Long Length of Stay Meetings, dates discussed:    Additional Comments:  Leeroy Cha, RN 08/17/2018, 12:33 PM

## 2018-08-17 NOTE — Progress Notes (Signed)
Derrick Orris   DOB:05-18-1924   WU#:[id number]    Assessment & Plan:  Multifactorial anemia She has anemia likely related to sepsis, myelodysplastic syndrome and anemia chronic renal failure Currently, I do not recommend restarting darbepoetin injection I have ordered serum erythropoietin level, result is pending I recommend blood transfusion to keep hemoglobin greater than 8 No need blood today  Leukocytosis, bacteremia/sepsis She is improving on broad-spectrum intravenous antibiotic therapy Continue the same, repeat cultures are pending  Acute on chronic renal failure, improving Likely due to recent infection It is improving Ultrasound renal is ok without obstruction  Moderate dementia No signs of delirium  Discharge planning She is not ready to be discharged, and likely will be here the next 48 hours I will continue to follow.   Heath Lark, MD 08/17/2018  6:49 AM   Subjective:  She is semi-alert, sleepy. Noted she has been afebrile, repeat cultures are pending Staff did not notice any bleeding  Objective:  Vitals:   08/16/18 1928 08/17/18 0427  BP: (!) 147/69 (!) 143/73  Pulse: 91 88  Resp: 20 20  Temp: 98.5 F (36.9 C) 98 F (36.7 C)  SpO2: 95% 97%     Intake/Output Summary (Last 24 hours) at 08/17/2018 0649 Last data filed at 08/17/2018 0300 Gross per 24 hour  Intake 810 ml  Output -  Net 810 ml    GENERAL:sleepy SKIN: skin palor noted   Labs:  Lab Results  Component Value Date   WBC 7.2 08/17/2018   HGB 8.5 (L) 08/17/2018   HCT 26.3 (L) 08/17/2018   MCV 85.4 08/17/2018   PLT 363 08/17/2018   NEUTROABS 14.7 (H) 08/14/2018    Lab Results  Component Value Date   NA 143 08/17/2018   K 4.2 08/17/2018   CL 114 (H) 08/17/2018   CO2 22 08/17/2018    Studies:  Ct Head Wo Contrast  Result Date: 08/15/2018 CLINICAL DATA:  Altered level of consciousness. EXAM: CT HEAD WITHOUT CONTRAST TECHNIQUE: Contiguous axial images were obtained from the base  of the skull through the vertex without intravenous contrast. COMPARISON:  None. FINDINGS: Brain: Image quality degraded by motion. Study was repeated with better results on the second attempt Moderate atrophy. Patchy hypodensity in the cerebral Gosser matter bilaterally appears chronic. No acute infarct, hemorrhage, mass Vascular: Negative for hyperdense vessel Skull: Negative Sinuses/Orbits: Mild mucosal edema sphenoid sinus. Orbits not adequately imaged. Other: None IMPRESSION: Moderate atrophy with mild chronic microvascular ischemia. No acute abnormality. Electronically Signed   By: Franchot Gallo M.D.   On: 08/15/2018 15:00   US Renal  Result Date: 08/16/2018 CLINICAL DATA:  84 year old female with acute kidney injury EXAM: RENAL / URINARY TRACT ULTRASOUND COMPLETE COMPARISON:  Prior CT abdomen/pelvis 07/08/2012 FINDINGS: Right Kidney: Length: 8.6 cm. Echogenicity within normal limits. No solid mass or hydronephrosis visualized. Small subcentimeter anechoic simple cyst at the lower pole. Left Kidney: Length: 10.5 cm. Echogenicity within normal limits. No solid mass or hydronephrosis visualized. Multiple anechoic cystic structures with posterior acoustic enhancement and imperceptible walls consistent with simple cysts arising from the lower pole of the left kidney. The largest measures 2.4 x 1.4 x 1.9 cm. Bladder: Appears normal for degree of bladder distention. IMPRESSION: 1. No evidence of hydronephrosis or other acute abnormality. 2. Bilateral simple renal cysts. Electronically Signed   By: Jacqulynn Cadet M.D.   On: 08/16/2018 09:51

## 2018-08-17 NOTE — Progress Notes (Signed)
[facility] Progress Note  Patient: Brittany [name] VQQ:[id number]   PCP: Blanchie Serve, MD DOB: 02-20-1924   DOA: 08/14/2018   DOS: 08/17/2018   Date of Service: the patient was seen and examined on 08/17/2018  Subjective: Feeling fatigued.  No more nausea confusion vomiting.  Brief hospital course: Pt. with PMH of vascular dementia, CVA, myelodysplastic syndrome, hypothyroidism, hypertension, CKD 3, iron deficiency anemia,HFpEF; admitted on 08/14/2018, presented with complaint of nausea and vomiting, was found to have UTI and pneumonia. Currently further plan is continue Antibiotics and await cultures .  Assessment and Plan: Sepsis due to UTI and aspiration pneumonia: possible source are UTI and pneumonia.   Patient with fever, tachycardia, leukocytosis and lactic acidosis on arrival.  Started on Vanco, aztreonam and Flagyl in ED. Status post 1 L LR Tachycardia and lactic acidosis resolved.   -Continue aztreonam, add doxycycline, stop vancomycin.  Patient with history of penicillin allergy. -Blood culture is positive for E. coli.  Sensitivities are available, resistant to Cipro only.   although given her penicillin allergies option would be limited.  Would likely transition her to oral Bactrim.  Still require coverage for pneumonia. IV fluids stopped yesterday due to wheezing. Speech therapy consulted as well  Hypothyroidism -Continue home Synthroid  Iron deficiency anemia Myelodysplastic syndrome -Hemoglobin at baseline -CBC in afternoon as there is dilutional drop -Patient does have history of myelodysplastic syndrome, patient follows up with Dr. Alvy Bimler.  Per family patient was supposed to receive EPO injection coming Tuesday. H&H dropped to 7.3 but remained stable at that level.  Patient was transfused 1 unit on 08/16/2018. Posttransfusion H&H is 8.8, continue to transfuse for hemoglobin less than 8. Discussed with oncology, also recommend that EPO will not be effective given  patient's sepsis picture.  Continue supportive management with blood transfusions.  AKI on CKD 3: Baseline creatinine 1.2 -IV fluid as above -Resume home losartan at low dose  renal function improved after blood transfusion.  Ultrasound renal negative for any obstruction or stone.  No hydronephrosis noted as well.  HFpEF: No echocardiogram in the chart. -No signs of fluid overload -Monitor respiratory status while on IV fluid  Dementia: patient only oriented to self.  Not sure about baseline mental status. -Delirium precaution  Goals of care discussion. Daughter who is from [place] brought up a point regarding sending patient's DNR as per request from patient's another daughter who is currently in [place] Patient does have a DNR papers signed December 29, 2014.  There are also copies of same order available in epic. I requested family to bring paperwork regarding [place], healthcare proxy, living well which can assist me and deciding decision-making capacity for the family to alter the code status. Also d/w with POA son, who agrees to maintain the DNR DNI  Diet: Regular diet DVT Prophylaxis: subcutaneous Heparin  Advance goals of care discussion: DNR DNI  Family Communication: family was present at bedside, at the time of interview.   Disposition:  Discharge to SNF, PT OT consulted.  Consultants: Oncology Procedures: none  Antibiotics: Anti-infectives (From admission, onward)   Start     Dose/Rate Route Frequency Ordered Stop   08/17/18 0800  aztreonam (AZACTAM) 1 g in sodium chloride 0.9 % 100 mL IVPB     1 g 200 mL/hr over 30 Minutes Intravenous Every 8 hours 08/17/18 0726     08/16/18 2200  vancomycin (VANCOCIN) 500 mg in sodium chloride 0.9 % 100 mL IVPB  Status:  Discontinued  500 mg 100 mL/hr over 60 Minutes Intravenous Every 48 hours 08/15/18 0534 08/15/18 1014   08/15/18 1100  doxycycline (VIBRAMYCIN) 100 mg in sodium chloride 0.9 % 250 mL IVPB       100 mg 125 mL/hr over 120 Minutes Intravenous Every 12 hours 08/15/18 1016     08/15/18 1000  aztreonam (AZACTAM) 1 g in sodium chloride 0.9 % 100 mL IVPB  Status:  Discontinued     1 g 200 mL/hr over 30 Minutes Intravenous Every 12 hours 08/15/18 0534 08/17/18 0726   08/14/18 2000  aztreonam (AZACTAM) 2 g in sodium chloride 0.9 % 100 mL IVPB     2 g 200 mL/hr over 30 Minutes Intravenous  Once 08/14/18 1959 08/14/18 2123   08/14/18 2000  metroNIDAZOLE (FLAGYL) IVPB 500 mg  Status:  Discontinued     500 mg 100 mL/hr over 60 Minutes Intravenous Every 8 hours 08/14/18 1959 08/15/18 0050   08/14/18 2000  vancomycin (VANCOCIN) IVPB 1000 mg/200 mL premix     1,000 mg 200 mL/hr over 60 Minutes Intravenous  Once 08/14/18 1959 08/14/18 2320       Objective: Physical Exam: Vitals:   08/16/18 1415 08/16/18 1928 08/17/18 0427 08/17/18 1351  BP: 132/63 (!) 147/69 (!) 143/73 (!) 155/70  Pulse: 84 91 88 80  Resp: 18 20 20    Temp: 98.7 F (37.1 C) 98.5 F (36.9 C) 98 F (36.7 C) 98.8 F (37.1 C)  TempSrc: Oral Oral Oral Oral  SpO2: 97% 95% 97% 94%  Weight:      Height:        Intake/Output Summary (Last 24 hours) at 08/17/2018 1701 Last data filed at 08/17/2018 0300 Gross per 24 hour  Intake 140 ml  Output -  Net 140 ml   Filed Weights   08/15/18 0502  Weight: 69.4 kg   General: Alert awake and oriented x3. Appear in mild distress, affect appropriate Eyes: PERRL, Conjunctiva normal ENT: Oral Mucosa clear moist. Neck: difficult to assess  JVD, no Abnormal Mass Or lumps Cardiovascular: S1 and S2 Present, aortic systolic Murmur, Peripheral Pulses Present Respiratory: normal respiratory effort, Bilateral Air entry equal and Decreased, no use of accessory muscle, Clear to Auscultation, no Crackles, no wheezes Abdomen: Bowel Sound present, Soft and no tenderness, no hernia Skin: no redness, no Rash, no induration Extremities: no Pedal edema, no calf tenderness Neurologic: Grossly no  focal neuro deficit. Bilaterally Equal motor strength  Data Reviewed: CBC: Recent Labs  Lab 08/14/18 2033 08/15/18 0449 08/15/18 1815 08/16/18 0941 08/17/18 0523  WBC 19.6* 16.3* 18.4* 12.0* 7.2  NEUTROABS 14.7*  --   --   --   --   HGB 8.6* 7.4* 7.3* 8.8* 8.5*  HCT 26.1* 23.0* 22.8* 26.6* 26.3*  MCV 84.5 85.2 84.8 85.0 85.4  PLT 572* 425* 418* 384 427   Basic Metabolic Panel: Recent Labs  Lab 08/14/18 2033 08/15/18 0449 08/15/18 1815 08/16/18 0941 08/17/18 0523  NA 144 142 143 144 143  K 4.9 4.6 4.6 4.3 4.2  CL 109 110 112* 113* 114*  CO2 21* 20* 23 21* 22  GLUCOSE 152* 154* 129* 125* 104*  BUN 29* 30* 36* 27* 22  CREATININE 1.56* 1.62* 1.72* 1.28* 1.13*  CALCIUM 10.2 9.1 9.1 8.9 8.8*  MG  --   --   --  1.6*  --     Liver Function Tests: Recent Labs  Lab 08/14/18 2033  AST 23  ALT 23  ALKPHOS 98  BILITOT 0.8  PROT 7.0  ALBUMIN 3.7   No results for input(s): LIPASE, AMYLASE in the last 168 hours. No results for input(s): AMMONIA in the last 168 hours. Coagulation Profile: Recent Labs  Lab 08/14/18 2033  INR 1.10   Cardiac Enzymes: No results for input(s): CKTOTAL, CKMB, CKMBINDEX, TROPONINI in the last 168 hours. BNP (last 3 results) No results for input(s): PROBNP in the last 8760 hours. CBG: Recent Labs  Lab 08/15/18 0745  GLUCAP 138*   Studies: No results found.  Scheduled Meds: . sodium chloride   Intravenous Once  . aspirin EC  81 mg Oral Daily  . cycloSPORINE  1 drop Both Eyes BID  . donepezil  10 mg Oral Daily  . enoxaparin (LOVENOX) injection  30 mg Subcutaneous Q24H  . feeding supplement (ENSURE ENLIVE)   Oral BID BM  . guaiFENesin  600 mg Oral BID  . levothyroxine  88 mcg Oral QAC breakfast  . losartan  25 mg Oral Daily   Continuous Infusions: . aztreonam 1 g (08/17/18 1531)  . doxycycline (VIBRAMYCIN) IV 100 mg (08/17/18 1156)   PRN Meds: acetaminophen, acetaminophen, albuterol, Glycerin-Hypromellose-PEG 400, haloperidol  lactate, lip balm, polyethylene glycol  Time spent: 35 minutes  Author: Berle Mull, MD [facility] Pager: [phone] 08/17/2018 5:01 PM  If 7PM-7AM, please contact night-coverage at [email or web address], password [facility]

## 2018-08-17 NOTE — Progress Notes (Signed)
Patient is from ALF, [facility].   LCSW will follow for disposition.   Brittany [name]

## 2018-08-17 NOTE — Evaluation (Signed)
Physical Therapy Evaluation Patient Details Name: Brittany [name] MRN: [id number] DOB: 18-Oct-1924 Today's Date: 08/17/2018   History of Present Illness   with PMH of vascular dementia, CVA, myelodysplastic syndrome, hypothyroidism, hypertension, CKD 3, iron deficiency anemia,; admitted on 08/14/2018 from ALF , presented with complaint of nausea and vomiting, was found to have UTI and pneumonia.  Clinical Impression  The patient requires assistance for all aspects of mobility. Noted audible wheezing with minimal effort. Ambulated x 15' x 2 with much effort. O2 saturation 97% on RA, HR 87. BP 155/86. The patient currently requires moderate assistance for mobility. ALF will need to determine ability to provide  Services at patient's current functional status. Pt admitted with above diagnosis. Pt currently with functional limitations due to the deficits listed below (see PT Problem List).  Pt will benefit from skilled PT to increase their independence and safety with mobility to allow discharge to the venue listed below.       Follow Up Recommendations SNF(vs HHPT at ALF)    Equipment Recommendations  None recommended by PT    Recommendations for Other Services       Precautions / Restrictions Restrictions Other Position/Activity Restrictions:  fall   Mobility  Bed Mobility Overal bed mobility: Needs Assistance Bed Mobility: Supine to Sit     Supine to sit: Mod assist;HOB elevated     General bed mobility comments: extra time  Transfers Overall transfer level: Needs assistance Equipment used: Rolling walker (2 wheeled) Transfers: Sit to/from Stand Sit to Stand: Mod assist         General transfer comment: steady assist to rise from bed and toilet using rail.   Ambulation/Gait Ambulation/Gait assistance: Mod assist Gait Distance (Feet): 15 Feet(x 2) Assistive device: Rolling walker (2 wheeled) Gait Pattern/deviations: Step-to pattern;Step-through pattern;Staggering  left;Staggering right;Trunk flexed;Decreased step length - right;Decreased stance time - right     General Gait Details: steady assist for safety and maneuvre RW around objects to get into BR and back out. Shuffling steps.   Stairs            Wheelchair Mobility    Modified Rankin (Stroke Patients Only)       Balance Overall balance assessment: Needs assistance Sitting-balance support: Bilateral upper extremity supported;Feet supported Sitting balance-Leahy Scale: Fair     Standing balance support: During functional activity;Bilateral upper extremity supported Standing balance-Leahy Scale: Poor                               Pertinent Vitals/Pain Pain Assessment: Faces Faces Pain Scale: Hurts even more Pain Location: back left arm Pain Descriptors / Indicators: Discomfort;Grimacing;Guarding Pain Intervention(s): Monitored during session    Home Living Family/patient expects to be discharged to:: Assisted living               Home Equipment: Gilford Rile - 2 wheels Additional Comments: [facility]    Prior Function Level of Independence: Needs assistance   Gait / Transfers Assistance Needed: amb with RW to meals, Up ad lib to BR           Hand Dominance        Extremity/Trunk Assessment   Upper Extremity Assessment Upper Extremity Assessment: RUE deficits/detail;LUE deficits/detail RUE Deficits / Details: limited elevation, LUE Deficits / Details: same as right    Lower Extremity Assessment Lower Extremity Assessment: Generalized weakness    Cervical / Trunk Assessment Cervical / Trunk Assessment: Kyphotic  Communication  Communication: No difficulties  Cognition Arousal/Alertness: Awake/alert Behavior During Therapy: WFL for tasks assessed/performed Overall Cognitive Status: History of cognitive impairments - at baseline                                 General Comments: coaching to ghet date correct       General Comments      Exercises     Assessment/Plan    PT Assessment Patient needs continued PT services  PT Problem List Decreased strength;Decreased activity tolerance;Decreased range of motion;Decreased cognition;Cardiopulmonary status limiting activity;Decreased balance;Decreased mobility;Decreased knowledge of precautions;Decreased safety awareness       PT Treatment Interventions DME instruction;Therapeutic exercise;Gait training;Functional mobility training;Therapeutic activities;Patient/family education    PT Goals (Current goals can be found in the Care Plan section)  Acute Rehab PT Goals Patient Stated Goal: to walk PT Goal Formulation: With patient/family Time For Goal Achievement: 08/31/18 Potential to Achieve Goals: Fair    Frequency Min 2X/week   Barriers to discharge        Co-evaluation               AM-PAC PT "6 Clicks" Daily Activity  Outcome Measure Difficulty turning over in bed (including adjusting bedclothes, sheets and blankets)?: Unable Difficulty moving from lying on back to sitting on the side of the bed? : Unable Difficulty sitting down on and standing up from a chair with arms (e.g., wheelchair, bedside commode, etc,.)?: Unable Help needed moving to and from a bed to chair (including a wheelchair)?: Total Help needed walking in hospital room?: Total Help needed climbing 3-5 steps with a railing? : Total 6 Click Score: 6    End of Session   Activity Tolerance: Patient limited by fatigue Patient left: with call bell/phone within reach;with family/visitor present(no chair alarm pads, RN aware) Nurse Communication: Mobility status PT Visit Diagnosis: Unsteadiness on feet (R26.81);Pain    Time: 1458-1535 PT Time Calculation (min) (ACUTE ONLY): 37 min   Charges:   PT Evaluation $PT Eval Moderate Complexity: 1 Mod PT Treatments $Gait Training: 8-22 mins        [place] PT [phone]   Claretha Cooper 08/17/2018, 3:48  PM

## 2018-08-18 ENCOUNTER — Ambulatory Visit: Payer: Medicare Other | Admitting: Hematology and Oncology

## 2018-08-18 ENCOUNTER — Other Ambulatory Visit: Payer: Medicare Other

## 2018-08-18 ENCOUNTER — Ambulatory Visit: Payer: Medicare Other

## 2018-08-18 DIAGNOSIS — N179 Acute kidney failure, unspecified: Secondary | ICD-10-CM

## 2018-08-18 DIAGNOSIS — A4151 Sepsis due to Escherichia coli [E. coli]: Principal | ICD-10-CM

## 2018-08-18 LAB — ERYTHROPOIETIN: Erythropoietin: 19.4 m[IU]/mL — ABNORMAL HIGH (ref 2.6–18.5)

## 2018-08-18 LAB — BASIC METABOLIC PANEL
ANION GAP: 7 (ref 5–15)
BUN: 22 mg/dL (ref 8–23)
CALCIUM: 8.6 mg/dL — AB (ref 8.9–10.3)
CO2: 22 mmol/L (ref 22–32)
Chloride: 114 mmol/L — ABNORMAL HIGH (ref 98–111)
Creatinine, Ser: 1.04 mg/dL — ABNORMAL HIGH (ref 0.44–1.00)
GFR, EST AFRICAN AMERICAN: 52 mL/min — AB (ref >60)
GFR, EST NON AFRICAN AMERICAN: 45 mL/min — AB (ref >60)
Glucose, Bld: 99 mg/dL (ref 70–99)
Potassium: 4.4 mmol/L (ref 3.5–5.1)
SODIUM: 143 mmol/L (ref 135–145)

## 2018-08-18 LAB — CBC
HCT: 26.8 % — ABNORMAL LOW (ref 36.0–46.0)
HEMOGLOBIN: 8.6 g/dL — AB (ref 12.0–15.0)
MCH: 27.8 pg (ref 26.0–34.0)
MCHC: 32.1 g/dL (ref 30.0–36.0)
MCV: 86.7 fL (ref 78.0–100.0)
Platelets: 328 10*3/uL (ref 150–400)
RBC: 3.09 MIL/uL — AB (ref 3.87–5.11)
RDW: 17 % — ABNORMAL HIGH (ref 11.5–15.5)
WBC: 4.9 10*3/uL (ref 4.0–10.5)

## 2018-08-18 MED ORDER — DOXYCYCLINE HYCLATE 100 MG PO TABS
100.0000 mg | ORAL_TABLET | Freq: Two times a day (BID) | ORAL | Status: DC
  Administered 2018-08-18 (×2): 100 mg via ORAL
  Filled 2018-08-18 (×2): qty 1

## 2018-08-18 MED ORDER — DOXYCYCLINE HYCLATE 100 MG PO TABS: 100.0000 mg | ORAL_TABLET | Freq: Two times a day (BID) | ORAL | 0 refills | Status: AC

## 2018-08-18 MED ORDER — SULFAMETHOXAZOLE-TRIMETHOPRIM 400-80 MG PO TABS: 1.0000 | ORAL_TABLET | Freq: Two times a day (BID) | ORAL | 0 refills | Status: AC

## 2018-08-18 MED ORDER — LOSARTAN POTASSIUM 50 MG PO TABS: 50.0000 mg | ORAL_TABLET | Freq: Every day | ORAL | 0 refills | Status: DC

## 2018-08-18 MED ORDER — GUAIFENESIN ER 600 MG PO TB12: 600.0000 mg | ORAL_TABLET | Freq: Two times a day (BID) | ORAL | 0 refills | Status: AC

## 2018-08-18 MED ORDER — SULFAMETHOXAZOLE-TRIMETHOPRIM 400-80 MG PO TABS
1.0000 | ORAL_TABLET | Freq: Two times a day (BID) | ORAL | Status: DC
  Administered 2018-08-18 (×2): 1 via ORAL
  Filled 2018-08-18 (×2): qty 1

## 2018-08-18 MED ORDER — LOSARTAN POTASSIUM 50 MG PO TABS: 50.0000 mg | ORAL_TABLET | Freq: Every day | ORAL | Status: DC

## 2018-08-18 NOTE — Clinical Social Work Note (Signed)
Clinical Social Work Assessment  Patient Details  Name: Brittany [name] MRN: [id number] Date of Birth: 1924/09/25  Date of referral:  08/18/18               Reason for consult:  Facility Placement                Permission sought to share information with:  Case Manager, Customer service manager, Family Supports Permission granted to share information::  (Patient disoriented)  Name::     Software engineer::  [facility]  Relationship::  Son  Sport and exercise psychologist Information:     Housing/Transportation Living arrangements for the past 2 months:  [place] of Information:  Adult Children Patient Interpreter Needed:  None Criminal Activity/Legal Involvement Pertinent to Current Situation/Hospitalization:  No - Comment as needed Significant Relationships:  Adult Children, Warehouse manager Lives with:  Facility Resident Do you feel safe going back to the place where you live?  Yes Need for family participation in patient care:  Yes (Comment)  Care giving concerns:  No care giving concerns at the time of assessment.    Social Worker assessment / plan:  LCSW following for SNF placement.   Patient is a resident at [facility].   PT is recommending SNF for rehab. Family is agreeable. Facility is agreeable to SNF as well. Patient will have to go to [place] has no SNF beds.    According to patient's son Patient has been at ALF since 2016 and uses a walker at baseline. Antony Haste, reports that patient became weak over the past week and was using a wheel chair to get to [employer].   PLAN SNF at dc.   Employment status:  Retired Nurse, adult PT Recommendations:  [place] / Referral to community resources:     Patient/Family's Response to care:  Family is thankful for [employer] coordination with DC.   Patient/Family's Understanding of and Emotional Response to Diagnosis, Current Treatment, and  Prognosis:  Son is agreeable that patient needs to regain strength prior to returning to ALF.   Emotional Assessment Appearance:  Appears stated age Attitude/Demeanor/Rapport:    Affect (typically observed):  Calm Orientation:  Oriented to Self Alcohol / Substance use:  Not Applicable Psych involvement (Current and /or in the community):  No (Comment)  Discharge Needs  Concerns to be addressed:  No discharge needs identified Readmission within the last 30 days:  No Current discharge risk:  None Barriers to Discharge:  Continued Medical Work up   [employer], LCSW 08/18/2018, 10:28 AM

## 2018-08-18 NOTE — Progress Notes (Signed)
Brittany [name]   DOB:25-Jul-1924   BT#:[id number]    Assessment & Plan:   Multifactorial anemia She has anemia likely related to sepsis, myelodysplastic syndrome and anemia chronic renal failure Currently, I do not recommend restarting darbepoetin injection I have ordered serum erythropoietin level, result is pending I recommend blood transfusion to keep hemoglobin greater than 8 No need blood today  Leukocytosis, bacteremia/sepsis She is improving on broad-spectrum intravenous antibiotic therapy Continue the same, repeat cultures are negative so far  Acute on chronic renal failure, improving Likely due to recent infection It is improving Ultrasound renal is ok without obstruction  Moderate dementia No signs of delirium  Discharge planning She will likely be discharged soon.  I will defer to primary service. I spoke with her son. I plan to arrange outpatient follow-up next week. I will sign off.  Please call if questions arise  Heath Lark, MD 08/18/2018  7:21 AM   Subjective:  She is sleepy this morning.  She has been afebrile.  Blood cultures show no growth after 1 day.  Objective:  Vitals:   08/17/18 2052 08/18/18 0442  BP: (!) 140/53 127/70  Pulse: 81 75  Resp:  15  Temp: 98.1 F (36.7 C) 98.7 F (37.1 C)  SpO2: 95% 97%     Intake/Output Summary (Last 24 hours) at 08/18/2018 0721 Last data filed at 08/17/2018 1330 Gross per 24 hour  Intake 240 ml  Output -  Net 240 ml    GENERAL:sleepy, comfortable   Labs:  Lab Results  Component Value Date   WBC 4.9 08/18/2018   HGB 8.6 (L) 08/18/2018   HCT 26.8 (L) 08/18/2018   MCV 86.7 08/18/2018   PLT 328 08/18/2018   NEUTROABS 14.7 (H) 08/14/2018    Lab Results  Component Value Date   NA 143 08/18/2018   K 4.4 08/18/2018   CL 114 (H) 08/18/2018   CO2 22 08/18/2018    Studies:  US Renal  Result Date: Aug 24, 2018 CLINICAL DATA:  84 year old female with acute kidney injury EXAM: RENAL / URINARY TRACT  ULTRASOUND COMPLETE COMPARISON:  Prior CT abdomen/pelvis 07/08/2012 FINDINGS: Right Kidney: Length: 8.6 cm. Echogenicity within normal limits. No solid mass or hydronephrosis visualized. Small subcentimeter anechoic simple cyst at the lower pole. Left Kidney: Length: 10.5 cm. Echogenicity within normal limits. No solid mass or hydronephrosis visualized. Multiple anechoic cystic structures with posterior acoustic enhancement and imperceptible walls consistent with simple cysts arising from the lower pole of the left kidney. The largest measures 2.4 x 1.4 x 1.9 cm. Bladder: Appears normal for degree of bladder distention. IMPRESSION: 1. No evidence of hydronephrosis or other acute abnormality. 2. Bilateral simple renal cysts. Electronically Signed   By: Jacqulynn Cadet M.D.   On: 08/24/2018 09:51

## 2018-08-18 NOTE — Clinical Social Work Placement (Signed)
   Patient will transport to [facility].   Patient will transport by [facility].   LCSW will fax dc docs once docs available.  Family notified of transport   RN report number: [phone]. Please call [facility] when report is given.   BKJ   CLINICAL SOCIAL WORK PLACEMENT  NOTE  Date:  08/18/2018  Patient Details  Name: Brittany [name] MRN: [id number] Date of Birth: 1924/03/21  Clinical Social Work is seeking post-discharge placement for this patient at the [place] level of care (*CSW will initial, date and re-position this form in  chart as items are completed):  Yes   Patient/family provided with [place] Work Department's list of facilities offering this level of care within the geographic area requested by the patient (or if unable, by the patient's family).  Yes   Patient/family informed of their freedom to choose among providers that offer the needed level of care, that participate in Medicare, Medicaid or managed care program needed by the patient, have an available bed and are willing to accept the patient.  Yes   Patient/family informed of [facility]'s ownership interest in [facility] and [facility], as well as of the fact that they are under no obligation to receive care at these facilities.  PASRR submitted to EDS on       PASRR number received on 08/18/18     Existing PASRR number confirmed on       FL2 transmitted to all facilities in geographic area requested by pt/family on 08/18/18     FL2 transmitted to all facilities within larger geographic area on       Patient informed that his/her managed care company has contracts with or will negotiate with certain facilities, including the following:        Yes   Patient/family informed of bed offers received.  Patient chooses bed at [facility]     Physician recommends and patient chooses bed at      Patient to be transferred to [facility] on  08/18/18.  Patient to be transferred to facility by EMS     Patient family notified on 08/18/18 of transfer.  Name of family member notified:  Antony Haste     PHYSICIAN Please prepare priority discharge summary, including medications, Please sign DNR     Additional Comment:    _______________________________________________ Servando Snare, LCSW 08/18/2018, 3:59 PM

## 2018-08-18 NOTE — Progress Notes (Signed)
Physical Therapy Treatment Patient Details Name: Batoul Limes MRN: [id number] DOB: 08-May-1924 Today's Date: 08/18/2018    History of Present Illness  with PMH of vascular dementia, CVA, myelodysplastic syndrome, hypothyroidism, hypertension, CKD 3, iron deficiency anemia,; admitted on 08/14/2018 from ALF , presented with complaint of nausea and vomiting, was found to have UTI and pneumonia.    PT Comments    The patient  Did ambulate with 2 assist and RW. Recommend SNF as patient will require 1:1 for all mobility. Continue PT.   Follow Up Recommendations  SNF     Equipment Recommendations  None recommended by PT    Recommendations for Other Services       Precautions / Restrictions Precautions Precautions: Fall    Mobility  Bed Mobility   Bed Mobility: Supine to Sit     Supine to sit: Min assist     General bed mobility comments: extra time  Transfers Overall transfer level: Needs assistance Equipment used: Rolling walker (2 wheeled) Transfers: Sit to/from [facility] Sit to Stand: Mod assist Stand pivot transfers: Mod assist       General transfer comment: steady assist to rise from bed and [facility]  Ambulation/Gait Ambulation/Gait assistance: Mod assist Gait Distance (Feet): 80 Feet Assistive device: Rolling walker (2 wheeled) Gait Pattern/deviations: Step-to pattern;Step-through pattern;Staggering left;Staggering right;Trunk flexed;Decreased step length - right;Decreased stance time - right     General Gait Details: steady assist for safety and maneuvre RW around objects t, gait is slow, cues for posture, shuffling steps. Sats 96% Noted  Dyspnea 3/4.   Stairs             Wheelchair Mobility    Modified Rankin (Stroke Patients Only)       Balance                                            Cognition Arousal/Alertness: Awake/alert                                            Exercises       General Comments        Pertinent Vitals/Pain Pain Assessment: Faces Faces Pain Scale: Hurts even more Pain Location: right hip and back Pain Descriptors / Indicators: Discomfort;Grimacing;Guarding Pain Intervention(s): Monitored during session    Home Living                      Prior Function            PT Goals (current goals can now be found in the care plan section) Progress towards PT goals: Progressing toward goals    Frequency    Min 2X/week      PT Plan Current plan remains appropriate    Co-evaluation              AM-PAC PT "6 Clicks" Daily Activity  Outcome Measure  Difficulty turning over in bed (including adjusting bedclothes, sheets and blankets)?: A Lot Difficulty moving from lying on back to sitting on the side of the bed? : A Lot Difficulty sitting down on and standing up from a chair with arms (e.g., wheelchair, bedside commode, etc,.)?: A Lot Help needed moving to and from a bed to chair (including a wheelchair)?:  A Lot Help needed walking in hospital room?: Total Help needed climbing 3-5 steps with a railing? : Total 6 Click Score: 10    End of Session Equipment Utilized During Treatment: Gait belt Activity Tolerance: Patient tolerated treatment well Patient left: in chair;with call bell/phone within reach(no chair alarm pads avaialble) Nurse Communication: Mobility status PT Visit Diagnosis: Unsteadiness on feet (R26.81);Pain Pain - Right/Left: Right Pain - part of body: Hip     Time: 3735-7897 PT Time Calculation (min) (ACUTE ONLY): 22 min  Charges:  $Gait Training: 8-22 mins                     [facility] PT [phone]    Claretha Cooper 08/18/2018, 12:22 PM

## 2018-08-18 NOTE — Progress Notes (Signed)
CSW sent D/C summary to [facility].  RN aware and pt D/C'ing now.  Please reconsult if future social work needs arise.  CSW signing off, as social work intervention is no longer needed.  Alphonse Guild. Marielena Harvell, LCSW, LCAS, CSI Clinical Social Worker Ph: [phone]

## 2018-08-18 NOTE — NC FL2 (Signed)
[place] MEDICAID FL2 LEVEL OF CARE SCREENING TOOL     IDENTIFICATION  Patient Name: Brittany [name] Birthdate: 12-Apr-1924 Sex: female Admission Date (Current Location): 08/14/2018  [facility] and [place] Number:  Herbalist and Address:  [facility],  [address] [place], [place]      Provider Number: [id number]  Attending Physician Name and Address:  Lavina Hamman, MD  Relative Name and Phone Number:       Current Level of Care: Hospital Recommended Level of Care: [place] Prior Approval Number:    Date Approved/Denied:   PASRR Number: [id number]  Discharge Plan: SNF    Current Diagnoses: Patient Active Problem List   Diagnosis Date Noted  . Leukocytosis 08/15/2018  . Sepsis ([place]) 08/15/2018  . UTI (urinary tract infection) 08/14/2018  . Fatigue 07/28/2018  . Weight loss 07/28/2018  . Actinic keratosis 06/26/2018  . Diastolic CHF ([place]) 34/74/2595  . History of CVA (cerebrovascular accident) 08/08/2017  . CKD (chronic kidney disease) stage 3, GFR 30-59 ml/min (HCC) 08/08/2017  . Anemia, iron deficiency 01/11/2016  . Unstable gait   . Shoulder pain   . History of melanoma 03/20/2015  . Constipation 07/18/2014  . Vascular dementia without behavioral disturbance 11/11/2012  . Stroke ([place]) 12/17/2011  . Back pain 04/23/2011  . COLONIC POLYPS, HX OF 06/05/2009  . Myelodysplastic syndrome ([place]) 01/02/2009  . Hypothyroidism 08/21/2007  . Hyperlipidemia 08/21/2007  . Essential hypertension 08/21/2007  . DIVERTICULOSIS, COLON 08/21/2007  . OSTEOPOROSIS 08/21/2007  . SKIN CANCER, HX OF 08/21/2007    Orientation RESPIRATION BLADDER Height & Weight     Self  Normal Continent Weight: 153 lb (69.4 kg) Height:  4\' 11"  (149.9 cm)  BEHAVIORAL SYMPTOMS/MOOD NEUROLOGICAL BOWEL NUTRITION STATUS      Continent Diet(See dc summary)  AMBULATORY STATUS COMMUNICATION OF NEEDS Skin   Extensive Assist Verbally Normal                       Personal Care Assistance Level of Assistance  Bathing, Feeding, Dressing Bathing Assistance: Limited assistance Feeding assistance: Independent Dressing Assistance: Limited assistance     Functional Limitations Info  Sight, Hearing, Speech Sight Info: Adequate Hearing Info: Impaired Speech Info: Adequate    SPECIAL CARE FACTORS FREQUENCY  PT (By licensed PT), OT (By licensed OT)     PT Frequency: 5x/week OT Frequency: 5x/week            Contractures Contractures Info: Not present    Additional Factors Info  Code Status, Allergies Code Status Info: DNR Allergies Info:  Lisinopril, Penicillins           Current Medications (08/18/2018):  This is the current hospital active medication list Current Facility-Administered Medications  Medication Dose Route Frequency Provider Last Rate Last Dose  . 0.9 %  sodium chloride infusion (Manually program via Guardrails IV Fluids)   Intravenous Once Lavina Hamman, MD      . 0.9 %  sodium chloride infusion   Intravenous PRN Lavina Hamman, MD 10 mL/hr at 08/17/18 2253 250 mL at 08/17/18 2253  . acetaminophen (TYLENOL) suppository 650 mg  650 mg Rectal Q4H PRN Lavina Hamman, MD      . acetaminophen (TYLENOL) tablet 650 mg  650 mg Oral Q6H PRN Lovey Newcomer T, NP   650 mg at 08/15/18 0618  . albuterol (PROVENTIL) (2.5 MG/3ML) 0.083% nebulizer solution 2.5 mg  2.5 mg Nebulization Q2H PRN Berle Mull  M, MD   2.5 mg at 08/17/18 1721  . aspirin EC tablet 81 mg  81 mg Oral Daily Lavina Hamman, MD   81 mg at 08/18/18 0949  . aztreonam (AZACTAM) 1 g in sodium chloride 0.9 % 100 mL IVPB  1 g Intravenous Q8H Adrian Saran, RPH 200 mL/hr at 08/18/18 0952 1 g at 08/18/18 0952  . cycloSPORINE (RESTASIS) 0.05 % ophthalmic emulsion 1 drop  1 drop Both Eyes BID Mercy Riding, MD   1 drop at 08/18/18 0950  . donepezil (ARICEPT) tablet 10 mg  10 mg Oral Daily Lavina Hamman, MD   10 mg at 08/18/18 0949  . doxycycline (VIBRAMYCIN)  100 mg in sodium chloride 0.9 % 250 mL IVPB  100 mg Intravenous Q12H Lavina Hamman, MD 125 mL/hr at 08/17/18 2256 100 mg at 08/17/18 2256  . enoxaparin (LOVENOX) injection 30 mg  30 mg Subcutaneous Q24H Wendee Beavers T, MD   30 mg at 08/18/18 0949  . feeding supplement (ENSURE ENLIVE) (ENSURE ENLIVE) liquid   Oral BID BM Lavina Hamman, MD      . Glycerin-Hypromellose-PEG 400 0.2-0.2-1 % SOLN 1 drop  1 drop Both Eyes Q2H PRN Gonfa, Taye T, MD      . guaiFENesin (MUCINEX) 12 hr tablet 600 mg  600 mg Oral BID Lavina Hamman, MD   600 mg at 08/18/18 0949  . haloperidol lactate (HALDOL) injection 2 mg  2 mg Intravenous Q6H PRN Lavina Hamman, MD      . levothyroxine (SYNTHROID, LEVOTHROID) tablet 88 mcg  88 mcg Oral QAC breakfast Lavina Hamman, MD   88 mcg at 08/17/18 1452  . lip balm (CARMEX) ointment   Topical PRN Lavina Hamman, MD      . losartan (COZAAR) tablet 25 mg  25 mg Oral Daily Lavina Hamman, MD   25 mg at 08/18/18 0949  . polyethylene glycol (MIRALAX / GLYCOLAX) packet 17 g  17 g Oral Daily PRN Mercy Riding, MD         Discharge Medications: Please see discharge summary for a list of discharge medications.  Relevant Imaging Results:  Relevant Lab Results:   Additional Information ssn: [id number]  Servando Snare, LCSW

## 2018-08-18 NOTE — Progress Notes (Signed)
OT  Note  Patient Details Name: Brittany [name] MRN: [id number] DOB: Oct 30, 1924   Cancelled Treatment:    Reason Eval/Treat Not Completed: Other (comment)  Pt had just gotten in chair with PT - will check back on pt  Kari Baars, [place]  Payton Mccallum D 08/18/2018, 9:44 AM

## 2018-08-18 NOTE — Evaluation (Signed)
Occupational Therapy Evaluation Patient Details Name: Brittany [name] MRN: [id number] DOB: Jun 18, 1924 Today's Date: 08/18/2018    History of Present Illness  with PMH of vascular dementia, CVA, myelodysplastic syndrome, hypothyroidism, hypertension, CKD 3, iron deficiency anemia,; admitted on 08/14/2018 from ALF , presented with complaint of nausea and vomiting, was found to have UTI and pneumonia.   Clinical Impression   Pt admitted with the above. Pt currently with functional limitations due to the deficits listed below (see OT Problem List).  Pt will benefit from skilled OT to increase their safety and independence with ADL and functional mobility for ADL to facilitate discharge to venue listed below.      Follow Up Recommendations  SNF    Equipment Recommendations  None recommended by OT    Recommendations for Other Services       Precautions / Restrictions Precautions Precautions: Fall Restrictions Other Position/Activity Restrictions:  with PMH of vascular dementia, CVA, myelodysplastic syndrome, hypothyroidism, hypertension, CKD 3, iron deficiency anemia,; admitted on 08/14/2018, presented with complaint of nausea and vomiting, was found to have UTI and pneumonia.      Mobility Bed Mobility   Bed Mobility: Supine to Sit;Sit to Supine     Supine to sit: Min assist Sit to supine: Min assist   General bed mobility comments: increased time  Transfers Overall transfer level: Needs assistance Equipment used: Rolling walker (2 wheeled) Transfers: Sit to/from Stand Sit to Stand: Mod assist Stand pivot transfers: Mod assist       General transfer comment: increased time    Balance Overall balance assessment: Needs assistance Sitting-balance support: Bilateral upper extremity supported;Feet supported Sitting balance-Leahy Scale: Fair     Standing balance support: During functional activity;Bilateral upper extremity supported Standing balance-Leahy Scale: Poor                              ADL either performed or assessed with clinical judgement   ADL Overall ADL's : Needs assistance/impaired     Grooming: Set up;Sitting   Upper Body Bathing: Minimal assistance;Sitting   Lower Body Bathing: Maximal assistance;Cueing for safety   Upper Body Dressing : Minimal assistance;Sitting   Lower Body Dressing: Maximal assistance;Sit to/from stand;Cueing for safety   Toilet Transfer: Moderate assistance;BSC;Stand-pivot   Toileting- Clothing Manipulation and Hygiene: Maximal assistance;Sit to/from stand         General ADL Comments: pt will need post acute rehab at [facility]     Vision Patient Visual Report: No change from baseline       Perception     Praxis      Pertinent Vitals/Pain Pain Assessment: No/denies pain     Hand Dominance     Extremity/Trunk Assessment Upper Extremity Assessment RUE Deficits / Details: limited elevation, LUE Deficits / Details: same as right       Cervical / Trunk Assessment Cervical / Trunk Assessment: Kyphotic   Communication Communication Communication: No difficulties   Cognition Arousal/Alertness: Awake/alert Behavior During Therapy: WFL for tasks assessed/performed Overall Cognitive Status: History of cognitive impairments - at baseline                                     General Comments       Exercises     Shoulder Instructions      Home Living Family/patient expects to be discharged to:: Assisted living  Home Equipment: Gilford Rile - 2 wheels   Additional Comments: [facility]      Prior Functioning/Environment Level of Independence: Needs assistance  Gait / Transfers Assistance Needed: amb with RW to meals, Up ad lib to BR              OT Problem List: Decreased strength;Decreased activity tolerance;Impaired balance (sitting and/or standing);Decreased safety awareness;Decreased knowledge of use of DME or  AE      OT Treatment/Interventions:      OT Goals(Current goals can be found in the care plan section) Acute Rehab OT Goals Patient Stated Goal: to walk OT Goal Formulation: With patient  OT Frequency:     Barriers to D/C:            Co-evaluation              AM-PAC PT "6 Clicks" Daily Activity     Outcome Measure Help from another person eating meals?: A Little Help from another person taking care of personal grooming?: A Little Help from another person toileting, which includes using toliet, bedpan, or urinal?: A Lot Help from another person bathing (including washing, rinsing, drying)?: A Lot Help from another person to put on and taking off regular upper body clothing?: A Little Help from another person to put on and taking off regular lower body clothing?: A Lot 6 Click Score: 15   End of Session Equipment Utilized During Treatment: Gait belt;Rolling walker Nurse Communication: Mobility status  Activity Tolerance: Patient limited by fatigue Patient left: in bed;with call bell/phone within reach;with bed alarm set  OT Visit Diagnosis: Unsteadiness on feet (R26.81);Repeated falls (R29.6);Other abnormalities of gait and mobility (R26.89);Muscle weakness (generalized) (M62.81);History of falling (Z91.81)                Time: 4356-8616 OT Time Calculation (min): 14 min Charges:  OT General Charges $OT Visit: 1 Visit OT Evaluation $OT Eval Moderate Complexity: [place], [place]  Betsy Pries 08/18/2018, 5:30 PM

## 2018-08-18 NOTE — Discharge Summary (Signed)
[facility] Discharge Summary   Patient: Brittany [name] AJG:[id number]   PCP: Blanchie Serve, MD DOB: 1924/03/08   Date of admission: 08/14/2018   Date of discharge:  08/18/2018    Discharge Diagnoses:  Principal Problem:   Sepsis [facility]) Active Problems:   Hypothyroidism   Essential hypertension   Vascular dementia without behavioral disturbance   Stroke ([place])   Anemia, iron deficiency   CKD (chronic kidney disease) stage 3, GFR 30-59 ml/min (HCC)   Diastolic CHF ([place])   UTI (urinary tract infection)   Leukocytosis   AKI (acute kidney injury) ([place])   Admitted From: ALF Disposition: SNF  Recommendations for Outpatient Follow-up:  1. Please follow-up with PCP in 1 week. 2. Please follow-up with Dr. Alvy Bimler in 1 week  Follow-up Information    Blanchie Serve, MD. Schedule an appointment as soon as possible for a visit in 1 week(s).   Specialty:  Internal Medicine Contact information: [place] [address] [phone]          Diet recommendation: Regular diet  Activity: The patient is advised to gradually reintroduce usual activities.  Discharge Condition: good  Code Status: DNR/DNI  History of present illness: As per the H and P dictated on admission, "Brittany [name] is a 84 y.o. female with medical history significant of vascular dementia, CVA, myelodysplastic syndrome, hypothyroidism, hypertension, CKD 3, iron deficiency anemia,HFpEF who presented from facility with generalized weakness and vomiting.  Patient is alert and awake but oriented only to self.  Per ED note,  "According to EMS the patient had one episode of nonbloody nonbilious vomiting at the facility which she resides. She is also found to be weak there so they called EMS. On EMS arrival patient was sleepier than per her baseline but was alert and oriented per her baseline. She had a fever with down to 103. No interventions prior to arrival here. Patient denies cough but also does  not remember vomiting or know where she is at right now"  In ED, vital signs significant for fever to 104 and tachycardia to 124.  CMP not impressive except for elevated creatinine to 1.56 (baseline 1.2).  CBC with leukocytosis to 19.6 and hemoglobin to 8.6 (baseline).  UA concerning for UTI.  CXR with cardiomegaly and patchy consolidation over medial RL hemithorax concerning for pneumonia/aspiration.  Blood and urine culture obtained.  She is given a liter of LR and started on aztreonam, Flagyl and vancomycin.  Medicine was called to admit patient for further evaluation and management."  Hospital Course:  Summary of her active problems in the hospital is as following. Sepsis due to UTI and aspiration pneumonia: E. coli bacteremia Patient with fever, tachycardia,leukocytosis and lactic acidosis on arrival.  Started on Vanco, aztreonam and Flagyl in ED. Status post 1 L LR Tachycardia and lactic acidosis resolved.  -Vancomycin was discontinued the next day. Continued IV aztreonam, add doxycycline Patient with history of penicillin allergy. -Blood culture is positive for E. coli.  Sensitivities are available, resistant to Cipro only.   although given her penicillin allergies option would be limited.    Discussed with ID. Given that the patient defervesced very quickly in 24 hours of initiation of the antibiotics it would be safe to transition her to oral Bactrim. Given renal function we will be using only single strength Bactrim twice daily.  Total of 7-day treatment course so 5 more days on discharge. We will continue to cover her with doxycycline for pneumonia as well for 5  more days. Speech therapy was consulted and patient passed swallowing eval. Recommendation was for regular diet.  Hypothyroidism -Continue home Synthroid  Iron deficiency anemia Myelodysplastic syndrome -Hemoglobin at baseline -CBC in afternoon as there is dilutional drop -Patient does have history of  myelodysplastic syndrome, patient follows up with Dr. Alvy Bimler.  Per family patient was supposed to receive EPO injection coming Tuesday. H&H dropped to 7.3 but remained stable at that level.  Patient was transfused 1 unit on 08/16/2018. Posttransfusion H&H is 8.8, and remained stable. Discussed with oncology, also recommend that EPO will not be effective given patient's sepsis picture.  Continue supportive management with blood transfusions.  AKI on CKD 3:Baseline creatinine 1.2 -IV fluid as above -Resume home losartan at low dose renal function improved after blood transfusion.  Ultrasound renal negative for any obstruction or stone.  No hydronephrosis noted as well.  HFpEF:No echocardiogram in the chart. -No signs of fluid overload -Monitor respiratory status while on IV fluid  Dementia: patient only oriented to self. Not sure about baseline mental status.  Goals of care discussion. Family initially wanted to rescind the DNR.  Later on after my discussion with daughter as well as power of attorney son they want to continue DNR.  All other chronic medical condition were stable during the hospitalization.  Patient was seen by physical therapy, who recommended SNF, which was arranged by Education officer, museum and case Freight forwarder. On the day of the discharge the patient's vitals were stable , and no other acute medical condition were reported by patient. the patient was felt safe to be discharge at SNF with therapy.  Consultants: Hematology Procedures: PRBC transfusion  DISCHARGE MEDICATION: Allergies as of 08/18/2018      Reactions   Lisinopril    REACTION: angioedema   Penicillins    REACTION: rash      Medication List    TAKE these medications   acetaminophen 500 MG tablet Commonly known as:  TYLENOL Take 500 mg by mouth every 8 (eight) hours as needed for mild pain. What changed:  Another medication with the same name was removed. Continue taking this medication, and follow the  directions you see here.   aspirin EC 81 MG tablet Take 81 mg by mouth daily.   CALTRATE 600+D PLUS 600-400 MG-UNIT per tablet Take 1 tablet by mouth daily.   cycloSPORINE 0.05 % ophthalmic emulsion Commonly known as:  RESTASIS Place 1 drop into both eyes 2 (two) times daily.   donepezil 10 MG tablet Commonly known as:  ARICEPT Take 10 mg by mouth daily.   doxycycline 100 MG tablet Commonly known as:  VIBRA-TABS Take 1 tablet (100 mg total) by mouth 2 (two) times daily for 5 days.   guaiFENesin 600 MG 12 hr tablet Commonly known as:  MUCINEX Take 1 tablet (600 mg total) by mouth 2 (two) times daily for 5 days.   hydroxypropyl methylcellulose / hypromellose 2.5 % ophthalmic solution Commonly known as:  ISOPTO TEARS / GONIOVISC Place 1 drop into both eyes every 2 (two) hours as needed for dry eyes.   levothyroxine 88 MCG tablet Commonly known as:  SYNTHROID, LEVOTHROID TAKE 1 TABLET BY MOUTH ONCE DAILY   losartan 50 MG tablet Commonly known as:  COZAAR Take 1 tablet (50 mg total) by mouth daily. Start taking on:  08/19/2018 What changed:    medication strength  See the new instructions.   multivitamin with minerals Tabs tablet Take 1 tablet by mouth daily.   NUTRITIONAL SHAKE PO  Take 120 mLs by mouth 2 (two) times daily.   polyethylene glycol powder powder Commonly known as:  GLYCOLAX/MIRALAX Take 17 g by mouth daily as needed for mild constipation.   PREVIDENT 5000 BOOSTER PLUS 1.1 % Pste Generic drug:  Sodium Fluoride Place onto teeth.   sulfamethoxazole-trimethoprim 400-80 MG tablet Commonly known as:  BACTRIM,SEPTRA Take 1 tablet by mouth every 12 (twelve) hours for 5 days.   VITAMIN D-3 PO Take 1,000 mg by mouth daily. 1,000mg  one daily      Allergies  Allergen Reactions  . Lisinopril     REACTION: angioedema  . Penicillins     REACTION: rash   Discharge Instructions    Diet - low sodium heart healthy   Complete by:  As directed    Discharge  instructions   Complete by:  As directed    It is important that you read following instructions as well as go over your medication list with RN to help you understand your care after this hospitalization.  Discharge Instructions: Please follow-up with PCP in one week  Please request your primary care physician to go over all Hospital Tests and Procedure/Radiological results at the follow up,  Please get all Hospital records sent to your PCP by signing hospital release before you go home.   Do not take more than prescribed Pain, Sleep and Anxiety Medications. You were cared for by a hospitalist during your hospital stay. If you have any questions about your discharge medications or the care you received while you were in the hospital after you are discharged, you can call the unit and ask to speak with the hospitalist on call if the hospitalist that took care of you is not available.  Once you are discharged, your primary care physician will handle any further medical issues. Please note that NO REFILLS for any discharge medications will be authorized once you are discharged, as it is imperative that you return to your primary care physician (or establish a relationship with a primary care physician if you do not have one) for your aftercare needs so that they can reassess your need for medications and monitor your lab values. You Must read complete instructions/literature along with all the possible adverse reactions/side effects for all the Medicines you take and that have been prescribed to you. Take any new Medicines after you have completely understood and accept all the possible adverse reactions/side effects. Wear Seat belts while driving. If you have smoked or chewed Tobacco in the last 2 yrs please stop smoking and/or stop any Recreational drug use.   Increase activity slowly   Complete by:  As directed      Discharge Exam: Filed Weights   08/15/18 0502  Weight: 69.4 kg   Vitals:     08/18/18 0442 08/18/18 1453  BP: 127/70 (!) 162/68  Pulse: 75 65  Resp: 15 (!) 22  Temp: 98.7 F (37.1 C) 97.6 F (36.4 C)  SpO2: 97% 96%   General: Appear in no distress, no Rash; Oral Mucosa moist. Cardiovascular: S1 and S2 Present, no Murmur, no JVD Respiratory: Bilateral Air entry present and Clear to Auscultation, no Crackles, no wheezes Abdomen: Bowel Sound present, Soft and no tenderness Extremities: no Pedal edema, no calf tenderness Neurology: Grossly no focal neuro deficit.  The results of significant diagnostics from this hospitalization (including imaging, microbiology, ancillary and laboratory) are listed below for reference.    Significant Diagnostic Studies: Dg Chest 2 View  Result Date: 08/14/2018 CLINICAL DATA:  Patient with fever and vomiting.  Weakness. EXAM: CHEST - 2 VIEW COMPARISON:  None. FINDINGS: Monitoring leads overlie the patient. Enlarged cardiac and mediastinal contours. Thoracic aortic vascular calcifications. Low lung volumes. Bilateral perihilar interstitial pulmonary opacities. Patchy consolidation medial right lower hemithorax. No pleural effusion or pneumothorax. Thoracic spine degenerative changes. Rightward curvature of the thoracic spine. IMPRESSION: Cardiomegaly. Patchy consolidation medial right lower hemithorax may represent pneumonia or aspiration. Followup PA and lateral chest X-ray is recommended in 3-4 weeks following trial of antibiotic therapy to ensure resolution and exclude underlying malignancy. Electronically Signed   By: Lovey Newcomer M.D.   On: 08/14/2018 21:02   Ct Head Wo Contrast  Result Date: 08/15/2018 CLINICAL DATA:  Altered level of consciousness. EXAM: CT HEAD WITHOUT CONTRAST TECHNIQUE: Contiguous axial images were obtained from the base of the skull through the vertex without intravenous contrast. COMPARISON:  None. FINDINGS: Brain: Image quality degraded by motion. Study was repeated with better results on the second attempt  Moderate atrophy. Patchy hypodensity in the cerebral Babel matter bilaterally appears chronic. No acute infarct, hemorrhage, mass Vascular: Negative for hyperdense vessel Skull: Negative Sinuses/Orbits: Mild mucosal edema sphenoid sinus. Orbits not adequately imaged. Other: None IMPRESSION: Moderate atrophy with mild chronic microvascular ischemia. No acute abnormality. Electronically Signed   By: Franchot Gallo M.D.   On: 08/15/2018 15:00   US Renal  Result Date: 08/16/2018 CLINICAL DATA:  84 year old female with acute kidney injury EXAM: RENAL / URINARY TRACT ULTRASOUND COMPLETE COMPARISON:  Prior CT abdomen/pelvis 07/08/2012 FINDINGS: Right Kidney: Length: 8.6 cm. Echogenicity within normal limits. No solid mass or hydronephrosis visualized. Small subcentimeter anechoic simple cyst at the lower pole. Left Kidney: Length: 10.5 cm. Echogenicity within normal limits. No solid mass or hydronephrosis visualized. Multiple anechoic cystic structures with posterior acoustic enhancement and imperceptible walls consistent with simple cysts arising from the lower pole of the left kidney. The largest measures 2.4 x 1.4 x 1.9 cm. Bladder: Appears normal for degree of bladder distention. IMPRESSION: 1. No evidence of hydronephrosis or other acute abnormality. 2. Bilateral simple renal cysts. Electronically Signed   By: Jacqulynn Cadet M.D.   On: 08/16/2018 09:51    Microbiology: Recent Results (from the past 240 hour(s))  Culture, blood (Routine x 2)     Status: None (Preliminary result)   Collection Time: 08/14/18  8:20 PM  Result Value Ref Range Status   Specimen Description   Final    BLOOD LEFT ANTECUBITAL Performed at [facility] [address], [place], [place] [address]    Special Requests   Final    BOTTLES DRAWN AEROBIC AND ANAEROBIC Blood Culture adequate volume Performed at [facility] [address] [place], [place] [address]    Culture   Final    NO  GROWTH 3 DAYS Performed at [facility], [place] [address], [place], [place] [address]    Report Status PENDING  Incomplete  Culture, blood (Routine x 2)     Status: Abnormal   Collection Time: 08/14/18  8:21 PM  Result Value Ref Range Status   Specimen Description   Final    BLOOD RIGHT WRIST Performed at [facility] [address], [place], [place] [address]    Special Requests   Final    BOTTLES DRAWN AEROBIC AND ANAEROBIC Blood Culture adequate volume Performed at [facility] [address], [place], [place] [address]    Culture  Setup Time   Final  GRAM NEGATIVE RODS AEROBIC BOTTLE ONLY CRITICAL RESULT CALLED TO, READ BACK BY AND VERIFIED WITH: T PICKERING,PHARMD AT 1821 08/15/18 BY L BENFIELD Performed at [facility], [place] [address], [place], [place] [address]    Culture ESCHERICHIA COLI (A)  Final   Report Status 08/17/2018 FINAL  Final   Organism ID, Bacteria ESCHERICHIA COLI  Final      Susceptibility   Escherichia coli - MIC*    AMPICILLIN <=2 SENSITIVE Sensitive     CEFAZOLIN <=4 SENSITIVE Sensitive     CEFEPIME <=1 SENSITIVE Sensitive     CEFTAZIDIME <=1 SENSITIVE Sensitive     CEFTRIAXONE <=1 SENSITIVE Sensitive     CIPROFLOXACIN >=4 RESISTANT Resistant     GENTAMICIN <=1 SENSITIVE Sensitive     IMIPENEM <=0.25 SENSITIVE Sensitive     TRIMETH/SULFA <=20 SENSITIVE Sensitive     AMPICILLIN/SULBACTAM <=2 SENSITIVE Sensitive     PIP/TAZO <=4 SENSITIVE Sensitive     Extended ESBL NEGATIVE Sensitive     * ESCHERICHIA COLI  Blood Culture ID Panel (Reflexed)     Status: Abnormal   Collection Time: 08/14/18  8:21 PM  Result Value Ref Range Status   Enterococcus species NOT DETECTED NOT DETECTED Final   Listeria monocytogenes NOT DETECTED NOT DETECTED Final   Staphylococcus species NOT DETECTED NOT DETECTED Final   Staphylococcus aureus NOT DETECTED NOT DETECTED Final   Streptococcus species NOT DETECTED NOT  DETECTED Final   Streptococcus agalactiae NOT DETECTED NOT DETECTED Final   Streptococcus pneumoniae NOT DETECTED NOT DETECTED Final   Streptococcus pyogenes NOT DETECTED NOT DETECTED Final   Acinetobacter baumannii NOT DETECTED NOT DETECTED Final   Enterobacteriaceae species DETECTED (A) NOT DETECTED Final    Comment: Enterobacteriaceae represent a large family of gram-negative bacteria, not a single organism. CRITICAL RESULT CALLED TO, READ BACK BY AND VERIFIED WITH: T PICKERING,PHARMD AT 1821 08/15/18 BY L BENFIELD    Enterobacter cloacae complex NOT DETECTED NOT DETECTED Final   Escherichia coli DETECTED (A) NOT DETECTED Final    Comment: CRITICAL RESULT CALLED TO, READ BACK BY AND VERIFIED WITH: T PICKERING,PHARMD AT 1821 08/15/18 BY L BENFIELD    Klebsiella oxytoca NOT DETECTED NOT DETECTED Final   Klebsiella pneumoniae NOT DETECTED NOT DETECTED Final   Proteus species NOT DETECTED NOT DETECTED Final   Serratia marcescens NOT DETECTED NOT DETECTED Final   Carbapenem resistance NOT DETECTED NOT DETECTED Final   Haemophilus influenzae NOT DETECTED NOT DETECTED Final   Neisseria meningitidis NOT DETECTED NOT DETECTED Final   Pseudomonas aeruginosa NOT DETECTED NOT DETECTED Final   Candida albicans NOT DETECTED NOT DETECTED Final   Candida glabrata NOT DETECTED NOT DETECTED Final   Candida krusei NOT DETECTED NOT DETECTED Final   Candida parapsilosis NOT DETECTED NOT DETECTED Final   Candida tropicalis NOT DETECTED NOT DETECTED Final    Comment: Performed at [facility], [place] [address] [place], [place] [address]  Urine culture     Status: Abnormal   Collection Time: 08/14/18  8:33 PM  Result Value Ref Range Status   Specimen Description   Final    URINE, CLEAN CATCH Performed at [facility], [place] [address], [place], [place] [address]    Special Requests   Final    NONE Performed at [facility], [place] [address] [place], [place]  [address]    Culture >=100,000 COLONIES/mL ESCHERICHIA COLI (A)  Final   Report Status 08/17/2018 FINAL  Final  Organism ID, Bacteria ESCHERICHIA COLI (A)  Final      Susceptibility   Escherichia coli - MIC*    AMPICILLIN <=2 SENSITIVE Sensitive     CEFAZOLIN <=4 SENSITIVE Sensitive     CEFTRIAXONE <=1 SENSITIVE Sensitive     CIPROFLOXACIN >=4 RESISTANT Resistant     GENTAMICIN <=1 SENSITIVE Sensitive     IMIPENEM <=0.25 SENSITIVE Sensitive     NITROFURANTOIN <=16 SENSITIVE Sensitive     TRIMETH/SULFA <=20 SENSITIVE Sensitive     AMPICILLIN/SULBACTAM <=2 SENSITIVE Sensitive     PIP/TAZO <=4 SENSITIVE Sensitive     Extended ESBL NEGATIVE Sensitive     * >=100,000 COLONIES/mL ESCHERICHIA COLI  MRSA PCR Screening     Status: None   Collection Time: 08/15/18  1:57 AM  Result Value Ref Range Status   MRSA by PCR NEGATIVE NEGATIVE Final    Comment:        The GeneXpert MRSA Assay (FDA approved for NASAL specimens only), is one component of a comprehensive MRSA colonization surveillance program. It is not intended to diagnose MRSA infection nor to guide or monitor treatment for MRSA infections. Performed at [facility], [place] [address] [place], [place] [address]   Culture, blood (routine x 2)     Status: None (Preliminary result)   Collection Time: 08/16/18  9:40 AM  Result Value Ref Range Status   Specimen Description   Final    BLOOD RIGHT ARM Performed at [facility] [address], [place], [place] [address]    Special Requests   Final    BAA BCAV Performed at [facility] [address], [place], [place] [address]    Culture   Final    NO GROWTH 2 DAYS Performed at [facility] [address] [place], [place] [address]    Report Status PENDING  Incomplete  Culture, blood (routine x 2)     Status: None (Preliminary result)   Collection Time: 08/16/18  9:43 AM  Result Value Ref Range Status   Specimen  Description   Final    BLOOD RIGHT ARM Performed at [facility] [address], [place], [place] [address]    Special Requests   Final    BAA BCAV Performed at [facility] [address], [place],  [address]    Culture   Final    NO GROWTH 2 DAYS Performed at [facility] [address] [place],  [address]    Report Status PENDING  Incomplete     Labs: CBC: Recent Labs  Lab 08/14/18 2033 08/15/18 0449 08/15/18 1815 08/16/18 0941 08/17/18 0523 08/18/18 0505  WBC 19.6* 16.3* 18.4* 12.0* 7.2 4.9  NEUTROABS 14.7*  --   --   --   --   --   HGB 8.6* 7.4* 7.3* 8.8* 8.5* 8.6*  HCT 26.1* 23.0* 22.8* 26.6* 26.3* 26.8*  MCV 84.5 85.2 84.8 85.0 85.4 86.7  PLT 572* 425* 418* 384 363 572   Basic Metabolic Panel: Recent Labs  Lab 08/15/18 0449 08/15/18 1815 08/16/18 0941 08/17/18 0523 08/18/18 0505  NA 142 143 144 143 143  K 4.6 4.6 4.3 4.2 4.4  CL 110 112* 113* 114* 114*  CO2 20* 23 21* 22 22  GLUCOSE 154* 129* 125* 104* 99  BUN 30* 36* 27* 22 22  CREATININE 1.62* 1.72* 1.28* 1.13* 1.04*  CALCIUM 9.1 9.1 8.9 8.8* 8.6*  MG  --   --  1.6*  --   --    Liver Function Tests: Recent Labs  Lab 08/14/18 2033  AST 23  ALT 23  ALKPHOS 98  BILITOT 0.8  PROT 7.0  ALBUMIN 3.7   No results for input(s): LIPASE, AMYLASE in the last 168 hours. No results for input(s): AMMONIA in the last 168 hours. Cardiac Enzymes: No results for input(s): CKTOTAL, CKMB, CKMBINDEX, TROPONINI in the last 168 hours. BNP (last 3 results) No results for input(s): BNP in the last 8760 hours. CBG: Recent Labs  Lab 08/15/18 0745  GLUCAP 138*   Time spent: 35 minutes  Signed:  Berle Mull  [facility]  08/18/2018  , 4:36 PM

## 2018-08-19 ENCOUNTER — Encounter: Payer: Self-pay | Admitting: Internal Medicine

## 2018-08-19 ENCOUNTER — Non-Acute Institutional Stay (SKILLED_NURSING_FACILITY): Payer: Medicare Other | Admitting: Internal Medicine

## 2018-08-19 ENCOUNTER — Telehealth: Payer: Self-pay

## 2018-08-19 DIAGNOSIS — N390 Urinary tract infection, site not specified: Secondary | ICD-10-CM | POA: Diagnosis not present

## 2018-08-19 DIAGNOSIS — B9620 Unspecified Escherichia coli [E. coli] as the cause of diseases classified elsewhere: Secondary | ICD-10-CM

## 2018-08-19 DIAGNOSIS — Z8673 Personal history of transient ischemic attack (TIA), and cerebral infarction without residual deficits: Secondary | ICD-10-CM

## 2018-08-19 DIAGNOSIS — F0150 Vascular dementia without behavioral disturbance: Secondary | ICD-10-CM

## 2018-08-19 DIAGNOSIS — R7881 Bacteremia: Secondary | ICD-10-CM

## 2018-08-19 DIAGNOSIS — D469 Myelodysplastic syndrome, unspecified: Secondary | ICD-10-CM

## 2018-08-19 DIAGNOSIS — I10 Essential (primary) hypertension: Secondary | ICD-10-CM

## 2018-08-19 DIAGNOSIS — R5381 Other malaise: Secondary | ICD-10-CM

## 2018-08-19 DIAGNOSIS — J690 Pneumonitis due to inhalation of food and vomit: Secondary | ICD-10-CM

## 2018-08-19 DIAGNOSIS — N183 Chronic kidney disease, stage 3 unspecified: Secondary | ICD-10-CM

## 2018-08-19 DIAGNOSIS — E039 Hypothyroidism, unspecified: Secondary | ICD-10-CM

## 2018-08-19 DIAGNOSIS — I5030 Unspecified diastolic (congestive) heart failure: Secondary | ICD-10-CM

## 2018-08-19 NOTE — Progress Notes (Signed)
PTAR came much later than expected after patient had fallen asleep.  Patient placed in paper gown, belongings packed up, and IVs d/c'd at beginning of shift.  Son went home since it was late.  New viItals were taken prior to transport to [facility], which were stable and charted.  Paper work and belongings sent with PTAR and patient.

## 2018-08-19 NOTE — Telephone Encounter (Signed)
Possible re-admission to facility. This is a patient you were seeing at [facility] . [facility] F/U is needed if patient was re-admitted to facility rehab upon discharge. Hospital discharge from [facility] on 08/18/2018

## 2018-08-19 NOTE — Progress Notes (Signed)
Provider:  Blanchie Serve MD  Location:  [facility]   Place of Service:  SNF (31)  PCP: Blanchie Serve, MD Patient Care Team: Blanchie Serve, MD as PCP - General (Internal Medicine) Milus Banister, MD as Consulting Physician (Gastroenterology) Heath Lark, MD as Consulting Physician (Hematology and Oncology) Mast, Man X, NP as Nurse Practitioner (Internal Medicine)  Extended Emergency Contact Information Primary Emergency Contact: [facility] Address: [address]          [place],  [address] [name] of [place] Phone: [phone] Work Phone: [phone] Mobile Phone: [phone] Relation: Son Secondary Emergency Contact: [name],[name] Address: [place] [address] [name] of [place] Phone: [phone] Relation: Daughter  Code Status: DNR  Goals of Care: Advanced Directive information Advanced Directives 08/19/2018  Does Patient Have a Medical Advance Directive? Yes  Type of Paramedic of [place];Out of facility DNR (pink MOST or yellow form)  Does patient want to make changes to medical advance directive? -  Copy of [place] in Chart? Yes  Would patient like information on creating a medical advance directive? -  Pre-existing out of facility DNR order (yellow form or pink MOST form) Yellow form placed in chart (order not valid for inpatient use)      Chief Complaint  Patient presents with  . New Admit To SNF    from [facility]    HPI: Patient is a 84 y.o. female seen today for admission visit. She was in the hospital from 08/14/18-08/18/18 with sepsis from E.coli bacteremia. She had UTI and aspiration pneumonia with acute on chronic renal impairment. She received iv fluids and iv antibiotics. ID team was consulted and she was switched to oral antibiotic. She was discharged from hospital on bactrim and doxycycline. She has medical history of vascular dementia,  hypertension, CKD stage 3, chronic diastolic CHF, hypothyroidism among others. She is seen in her room today. She is pleasantly confused and denies any concern.   Past Medical History:  Diagnosis Date  . Blood transfusion 2006  . Colon polyps   . CVA (cerebral infarction) 2013  . Dementia   . Diverticulosis of colon   . DJD (degenerative joint disease)   . Hyperlipidemia   . Hypertension   . Hypothyroidism   . Melanoma ([place]) 03/20/15   removed with Moh's surgery  . Myelodysplasia 02/04/2012  . Normochromic normocytic anemia 02/04/2012  . Onychomycosis   . Osteoporosis   . Rectal prolapse   . Shoulder pain   . Unstable gait    Past Surgical History:  Procedure Laterality Date  . APPENDECTOMY    . CARPAL TUNNEL RELEASE Bilateral 2006  . FLEXIBLE SIGMOIDOSCOPY  07/09/2012   Procedure: FLEXIBLE SIGMOIDOSCOPY;  Surgeon: Inda Castle, MD;  Location: [facility] ENDOSCOPY;  Service: Endoscopy;  Laterality: N/A;  . MELANOMA EXCISION Right 2005   arm  . POLYPECTOMY  2004   Laparoscopic  . RECTOCELE REPAIR    . REPLACEMENT TOTAL KNEE  2005 & 2006  . VAGINAL HYSTERECTOMY  1989    reports that she has never smoked. She has never used smokeless tobacco. She reports that she does not drink alcohol or use drugs. Social History   Socioeconomic History  . Marital status: Married    Spouse name: Not on file  . Number of children: Not on file  . Years of education: Not on file  . Highest education  level: Not on file  Occupational History  . Occupation: English as a second language teacher  Social Needs  . Financial resource strain: Not hard at all  . Food insecurity:    Worry: Never true    Inability: Never true  . Transportation needs:    Medical: No    Non-medical: No  Tobacco Use  . Smoking status: Never Smoker  . Smokeless tobacco: Never Used  Substance and Sexual Activity  . Alcohol use: No  . Drug use: No  . Sexual activity: Yes    Birth control/protection: Surgical    Comment: HYST  Lifestyle  .  Physical activity:    Days per week: 0 days    Minutes per session: 0 min  . Stress: Not on file  Relationships  . Social connections:    Talks on phone: Not on file    Gets together: Not on file    Attends religious service: Not on file    Active member of club or organization: Not on file    Attends meetings of clubs or organizations: Not on file    Relationship status: Not on file  . Intimate partner violence:    Fear of current or ex partner: Not on file    Emotionally abused: Not on file    Physically abused: Not on file    Forced sexual activity: Not on file  Other Topics Concern  . Not on file  Social History Narrative   [facility], moved to [place] 12/28/14   married   Never smoked   Alcohol none   Caffeine Coffee 1 cup   Exercise none    Functional Status Survey:    Family History  Problem Relation Age of Onset  . Prostate cancer Brother   . Cancer Brother        renal cell carcinoma  . Heart disease Brother   . Breast cancer Mother 41  . Cancer Father        unknown kind  . Arthritis Daughter     Health Maintenance  Topic Date Due  . OPHTHALMOLOGY EXAM  11/15/2014  . FOOT EXAM  11/30/2014  . HEMOGLOBIN A1C  10/11/2015  . INFLUENZA VACCINE  07/16/2018  . TETANUS/TDAP  04/22/2021  . DEXA SCAN  Completed  . PNA vac Low Risk Adult  Completed    Allergies  Allergen Reactions  . Lisinopril     REACTION: angioedema  . Penicillins     REACTION: rash    Outpatient Encounter Medications as of 08/19/2018  Medication Sig  . acetaminophen (TYLENOL) 500 MG tablet Take 500 mg by mouth every 8 (eight) hours as needed for mild pain.   Marland Kitchen aspirin EC 81 MG tablet Take 81 mg by mouth daily.  . Calcium Carbonate-Vit D-Min (CALTRATE 600+D PLUS) 600-400 MG-UNIT per tablet Take 1 tablet by mouth daily.    . Cholecalciferol (VITAMIN D-3 PO) Take 1,000 mg by mouth daily. 1,'000mg'$  one daily  . cycloSPORINE (RESTASIS) 0.05 % ophthalmic emulsion Place 1 drop  into both eyes 2 (two) times daily.    Marland Kitchen donepezil (ARICEPT) 10 MG tablet Take 10 mg by mouth daily.  Marland Kitchen doxycycline (VIBRA-TABS) 100 MG tablet Take 1 tablet (100 mg total) by mouth 2 (two) times daily for 5 days.  Marland Kitchen guaiFENesin (MUCINEX) 600 MG 12 hr tablet Take 1 tablet (600 mg total) by mouth 2 (two) times daily for 5 days.  . hydroxypropyl methylcellulose / hypromellose (ISOPTO TEARS / GONIOVISC) 2.5 % ophthalmic  solution Place 1 drop into both eyes every 2 (two) hours as needed for dry eyes.  Marland Kitchen levothyroxine (SYNTHROID, LEVOTHROID) 88 MCG tablet TAKE 1 TABLET BY MOUTH ONCE DAILY  . losartan (COZAAR) 50 MG tablet Take 1 tablet (50 mg total) by mouth daily.  . Multiple Vitamin (MULTIVITAMIN WITH MINERALS) TABS Take 1 tablet by mouth daily.  . Nutritional Supplements (NUTRITIONAL SHAKE PO) Take 120 mLs by mouth 2 (two) times daily.  . polyethylene glycol powder (GLYCOLAX/MIRALAX) powder Take 17 g by mouth daily as needed for mild constipation.   . Sodium Fluoride (PREVIDENT 5000 BOOSTER PLUS) 1.1 % PSTE Place onto teeth.  . sulfamethoxazole-trimethoprim (BACTRIM,SEPTRA) 400-80 MG tablet Take 1 tablet by mouth every 12 (twelve) hours for 5 days.   No facility-administered encounter medications on file as of 08/19/2018.     Review of Systems  Reason unable to perform ROS: limited with her dementia.  Constitutional: Positive for fatigue. Negative for appetite change, chills and fever.  HENT: Positive for rhinorrhea. Negative for congestion, ear pain, hearing loss, mouth sores, postnasal drip, sinus pressure, sinus pain and sore throat.   Respiratory: Positive for cough. Negative for shortness of breath and wheezing.        Delayed coughing with fluid per SLP  Cardiovascular: Negative for chest pain and palpitations.  Gastrointestinal: Negative for abdominal pain, constipation, diarrhea, nausea and vomiting.  Genitourinary: Negative for dysuria and hematuria.  Musculoskeletal: Positive for gait  problem.  Skin: Negative for rash.  Neurological: Positive for weakness. Negative for dizziness, light-headedness and headaches.  Hematological: Bruises/bleeds easily.  Psychiatric/Behavioral: Positive for confusion. Negative for behavioral problems and sleep disturbance.    Vitals:   08/19/18 1409  BP: (!) 144/70  Pulse: 63  Resp: 18  Temp: 98.2 F (36.8 C)  SpO2: 93%  Weight: 147 lb (66.7 kg)  Height: '4\' 11"'$  (1.499 m)   Body mass index is 29.69 kg/m.   Wt Readings from Last 3 Encounters:  08/19/18 147 lb (66.7 kg)  08/15/18 153 lb (69.4 kg)  07/28/18 153 lb (69.4 kg)   Physical Exam  Constitutional: She is oriented to person, place, and time. No distress.  Overweight elderly female  HENT:  Head: Normocephalic and atraumatic.  Nose: Nose normal.  Mouth/Throat: Oropharynx is clear and moist. No oropharyngeal exudate.  Eyes: Pupils are equal, round, and reactive to light. Conjunctivae and EOM are normal.  Neck: Normal range of motion. Neck supple.  Cardiovascular: Normal rate and regular rhythm.  Murmur heard. Pulmonary/Chest: Effort normal. No respiratory distress. She has no wheezes. She has rales.  Abdominal: Soft. Bowel sounds are normal. There is no tenderness. There is no guarding.  Musculoskeletal: She exhibits edema.  Trace leg edema, can move all 4 extremities, generalized weakness, unsteady gait, walker for ambulation with 1 person assistance  Lymphadenopathy:    She has no cervical adenopathy.  Neurological: She is alert and oriented to person, place, and time.  Short term recall is poor, does not know why she was hospitalized, needs redirection, poor safety awareness  Skin: Skin is warm and dry. She is not diaphoretic.  Psychiatric:  Pleasantly confused    Labs reviewed: Basic Metabolic Panel: Recent Labs    08/16/18 0941 08/17/18 0523 08/18/18 0505  NA 144 143 143  K 4.3 4.2 4.4  CL 113* 114* 114*  CO2 21* 22 22  GLUCOSE 125* 104* 99  BUN 27*  22 22  CREATININE 1.28* 1.13* 1.04*  CALCIUM 8.9 8.8* 8.6*  MG 1.6*  --   --    Liver Function Tests: Recent Labs    07/30/18 08/04/18 08/14/18 2033  AST '29 14 23  '$ ALT 40* 21 23  ALKPHOS 114 92 98  BILITOT  --   --  0.8  PROT 6.2 6.7 7.0  ALBUMIN 3.7 4.0 3.7   No results for input(s): LIPASE, AMYLASE in the last 8760 hours. No results for input(s): AMMONIA in the last 8760 hours. CBC: Recent Labs    08/14/18 2033  08/16/18 0941 08/17/18 0523 08/18/18 0505  WBC 19.6*   < > 12.0* 7.2 4.9  NEUTROABS 14.7*  --   --   --   --   HGB 8.6*   < > 8.8* 8.5* 8.6*  HCT 26.1*   < > 26.6* 26.3* 26.8*  MCV 84.5   < > 85.0 85.4 86.7  PLT 572*   < > 384 363 328   < > = values in this interval not displayed.   Cardiac Enzymes: No results for input(s): CKTOTAL, CKMB, CKMBINDEX, TROPONINI in the last 8760 hours. BNP: Invalid input(s): POCBNP Lab Results  Component Value Date   HGBA1C 5.6 04/11/2015   Lab Results  Component Value Date   TSH 1.88 08/11/2018   Lab Results  Component Value Date   VITAMINB12 2,000 08/11/2018   Lab Results  Component Value Date   FOLATE >20.0 02/11/2007   Lab Results  Component Value Date   IRON 26 08/04/2018   TIBC 288 08/09/2014   FERRITIN 946 08/11/2018    Imaging and Procedures obtained prior to SNF admission: Dg Chest 2 View  Result Date: 08/14/2018 CLINICAL DATA:  Patient with fever and vomiting.  Weakness. EXAM: CHEST - 2 VIEW COMPARISON:  None. FINDINGS: Monitoring leads overlie the patient. Enlarged cardiac and mediastinal contours. Thoracic aortic vascular calcifications. Low lung volumes. Bilateral perihilar interstitial pulmonary opacities. Patchy consolidation medial right lower hemithorax. No pleural effusion or pneumothorax. Thoracic spine degenerative changes. Rightward curvature of the thoracic spine. IMPRESSION: Cardiomegaly. Patchy consolidation medial right lower hemithorax may represent pneumonia or aspiration. Followup PA  and lateral chest X-ray is recommended in 3-4 weeks following trial of antibiotic therapy to ensure resolution and exclude underlying malignancy. Electronically Signed   By: Lovey Newcomer M.D.   On: 08/14/2018 21:02   Ct Head Wo Contrast  Result Date: 08/15/2018 CLINICAL DATA:  Altered level of consciousness. EXAM: CT HEAD WITHOUT CONTRAST TECHNIQUE: Contiguous axial images were obtained from the base of the skull through the vertex without intravenous contrast. COMPARISON:  None. FINDINGS: Brain: Image quality degraded by motion. Study was repeated with better results on the second attempt Moderate atrophy. Patchy hypodensity in the cerebral [place] matter bilaterally appears chronic. No acute infarct, hemorrhage, mass Vascular: Negative for hyperdense vessel Skull: Negative Sinuses/Orbits: Mild mucosal edema sphenoid sinus. Orbits not adequately imaged. Other: None IMPRESSION: Moderate atrophy with mild chronic microvascular ischemia. No acute abnormality. Electronically Signed   By: Franchot Gallo M.D.   On: 08/15/2018 15:00    Assessment/Plan  1. Physical deconditioning Will have her work with physical therapy and occupational therapy team to help with gait training and muscle strengthening exercises.fall precautions. Skin care. Encourage to be out of bed.   2. E-coli UTI Continue and complete course of bactrim on 08/23/18. Maintain hydration and perineal care  3. Bacteremia Repeat blood culture showed no bacterial growth. Continue and complete course of bactrim on 08/23/18. Monitor wbc and temp curve  4. Aspiration pneumonia of  right lower lobe, unspecified aspiration pneumonia type ([place]) Aspiration precautions, SLP consult, possible thickened liquid given cough after fluid intake. Oral care after meals, incentive spirometer, continue and complete course of doxycycline on 08/23/18  5. Essential hypertension Continue baby aspirin and losartan 50 mg daily. Check BP  6. Diastolic congestive heart  failure, unspecified HF chronicity (HCC) Continue losartan 50 mg daily. Monitor bmp  7. Acquired hypothyroidism Reviewed TSH, stable, continue levothyroxine  8. Vascular dementia without behavioral disturbance Supportive care, continue donepezil, fall precautions  9. CKD (chronic kidney disease) stage 3, GFR 30-59 ml/min (HCC) S/p iv fluids with AKI. With her on losartan, monitor renal function  10. History of CVA (cerebrovascular accident) Continue aspirin and antihypertensive  11. Myelodysplastic syndrome (HCC) Low h&h. S/p 1 u prbc transfusion in hospital. Monitor cbc. F/u with oncology   Family/ staff Communication: reviewed care plan with patient and charge nurse.    Labs/tests ordered: cbc with diff, cmp with eGFR 08/24/18  Blanchie Serve, MD Internal Medicine [facility] [address] [place], [place] [address] Cell Phone (Monday-Friday 8 am - 5 pm): [phone] On Call: [phone] and follow prompts after 5 pm and on weekends Office Phone: [phone] Office Fax: [phone]

## 2018-08-20 LAB — CULTURE, BLOOD (ROUTINE X 2)
Culture: NO GROWTH
Special Requests: ADEQUATE

## 2018-08-21 ENCOUNTER — Telehealth: Payer: Self-pay | Admitting: Hematology and Oncology

## 2018-08-21 LAB — CULTURE, BLOOD (ROUTINE X 2)
CULTURE: NO GROWTH
Culture: NO GROWTH
SPECIAL REQUESTS: ADEQUATE
Special Requests: ADEQUATE

## 2018-08-21 NOTE — Telephone Encounter (Signed)
Tried to reach regarding 9/12 however the son ph number was not in service and the home number kept ringing. I will mail a letter

## 2018-08-24 LAB — CBC
HCT: 25
HEMOGLOBIN: 8.3
MCV: 80.6
WBC: 4.7
platelet count: 363

## 2018-08-24 LAB — COMPLETE METABOLIC PANEL WITH GFR
ALBUMIN: 3.5
ALK PHOS: 75
ALT: 19
AST: 15
BUN: 33 — AB (ref 4–21)
Calcium: 9
Creat: 1.67
EGFR (Non-African Amer.): 26
GLUCOSE: 78
Potassium: 5.1
Sodium: 139
TOTAL PROTEIN: 5.5 g/dL
Total Bilirubin: 0.3

## 2018-08-25 ENCOUNTER — Encounter: Payer: Self-pay | Admitting: *Deleted

## 2018-08-27 ENCOUNTER — Telehealth: Payer: Self-pay | Admitting: Hematology and Oncology

## 2018-08-27 ENCOUNTER — Inpatient Hospital Stay: Payer: Medicare Other | Attending: Hematology and Oncology

## 2018-08-27 ENCOUNTER — Inpatient Hospital Stay (HOSPITAL_BASED_OUTPATIENT_CLINIC_OR_DEPARTMENT_OTHER): Payer: Medicare Other | Admitting: Hematology and Oncology

## 2018-08-27 ENCOUNTER — Inpatient Hospital Stay: Payer: Medicare Other

## 2018-08-27 ENCOUNTER — Encounter: Payer: Self-pay | Admitting: Hematology and Oncology

## 2018-08-27 DIAGNOSIS — N183 Chronic kidney disease, stage 3 unspecified: Secondary | ICD-10-CM

## 2018-08-27 DIAGNOSIS — Z7982 Long term (current) use of aspirin: Secondary | ICD-10-CM | POA: Insufficient documentation

## 2018-08-27 DIAGNOSIS — D469 Myelodysplastic syndrome, unspecified: Secondary | ICD-10-CM

## 2018-08-27 DIAGNOSIS — F0150 Vascular dementia without behavioral disturbance: Secondary | ICD-10-CM | POA: Insufficient documentation

## 2018-08-27 DIAGNOSIS — Z79899 Other long term (current) drug therapy: Secondary | ICD-10-CM | POA: Diagnosis not present

## 2018-08-27 LAB — CBC WITH DIFFERENTIAL/PLATELET
Basophils Absolute: 0 10*3/uL (ref 0.0–0.1)
Basophils Relative: 0 %
EOS ABS: 0.1 10*3/uL (ref 0.0–0.5)
Eosinophils Relative: 1 %
HCT: 30.3 % — ABNORMAL LOW (ref 34.8–46.6)
HEMOGLOBIN: 9.6 g/dL — AB (ref 11.6–15.9)
Lymphocytes Relative: 29 %
Lymphs Abs: 1.5 10*3/uL (ref 0.9–3.3)
MCH: 27.2 pg (ref 25.1–34.0)
MCHC: 31.7 g/dL (ref 31.5–36.0)
MCV: 85.8 fL (ref 79.5–101.0)
Monocytes Absolute: 0.5 10*3/uL (ref 0.1–0.9)
Monocytes Relative: 10 %
NEUTROS PCT: 60 %
Neutro Abs: 3.1 10*3/uL (ref 1.5–6.5)
PLATELETS: 375 10*3/uL (ref 145–400)
RBC: 3.53 MIL/uL — AB (ref 3.70–5.45)
RDW: 17.6 % — ABNORMAL HIGH (ref 11.2–14.5)
WBC: 5.2 10*3/uL (ref 3.9–10.3)

## 2018-08-27 MED ORDER — DARBEPOETIN ALFA 200 MCG/0.4ML IJ SOSY
200.0000 ug | PREFILLED_SYRINGE | Freq: Once | INTRAMUSCULAR | Status: AC
  Administered 2018-08-27: 200 ug via SUBCUTANEOUS

## 2018-08-27 NOTE — Assessment & Plan Note (Signed)
Her hemoglobin has started to trend down I discussed the role of ESA with her son We discussed some of the risks, benefits, and alternatives of erythropoietin stimulating agents such as Procrit or Aranesp. The patient is symptomatic from anemia and the EPO level is low. Some of the side-effects to be expected including risks of allergic reactions, skin rashes, headaches, risk of blood clots including heart attack and stroke. There is rare risks of causing growth of cancers.The patient is willing to proceed  I will start with a similar dose as before and to prescribe the treatment every 4 weeks, with goal of hemoglobin greater than 10 I will see her back prior to fourth dose to see if dose adjustment is necessary

## 2018-08-27 NOTE — Assessment & Plan Note (Signed)
She has chronic dementia, stable I recommend her son to come along with all her future visit.

## 2018-08-27 NOTE — Assessment & Plan Note (Signed)
She had recent acute on chronic renal failure secondary to dehydration and sepsis She will continue close monitoring with her primary care doctor and risk factors modification

## 2018-08-27 NOTE — Telephone Encounter (Signed)
Gave patient and loved one avs and calendar.

## 2018-08-27 NOTE — Progress Notes (Signed)
[name] OFF[facility] PROGRESS NOTE  Patient Care Team: Blanchie Serve, MD as PCP - General (Internal Medicine) Milus Banister, MD as Consulting Physician (Gastroenterology) Heath Lark, MD as Consulting Physician (Hematology and Oncology) Mast, Man X, NP as Nurse Practitioner (Internal Medicine)  ASSESSMENT & PLAN:  Myelodysplastic syndrome ([place]) Her hemoglobin has started to trend down I discussed the role of ESA with her son We discussed some of the risks, benefits, and alternatives of erythropoietin stimulating agents such as Procrit or Aranesp. The patient is symptomatic from anemia and the EPO level is low. Some of the side-effects to be expected including risks of allergic reactions, skin rashes, headaches, risk of blood clots including heart attack and stroke. There is rare risks of causing growth of cancers.The patient is willing to proceed  I will start with a similar dose as before and to prescribe the treatment every 4 weeks, with goal of hemoglobin greater than 10 I will see her back prior to fourth dose to see if dose adjustment is necessary  Vascular dementia without behavioral disturbance She has chronic dementia, stable I recommend her son to come along with all her future visit.  CKD (chronic kidney disease) stage 3, GFR 30-59 ml/min (HCC) She had recent acute on chronic renal failure secondary to dehydration and sepsis She will continue close monitoring with her primary care doctor and risk factors modification   No orders of the defined types were placed in this encounter.   INTERVAL HISTORY: Please see below for problem oriented charting. She returns with her son for further follow-up She was recently discharged from the hospital after E. coli sepsis She feels well Denies pain. The patient denies any recent signs or symptoms of bleeding such as spontaneous epistaxis, hematuria or hematochezia.  SUMMARY OF ONCOLOGIC HISTORY:  I reviewed the  patient's records extensive and collaborated the history with the patient. Summary of her history is as follows: This is a pleasant lady with a myeloproliferative disorder with dysplastic features. She was diagnosed in May of 2008 when she presented with unexplained normochromic anemia. Bone marrow aspiration and biopsy was done on 04/23/2007. Marrow was hypercellular for age 54-80% Maturation was normal in all 3 cell lines and there were only 1% blasts. Cytogenetics were normal including FISH studies to look at chromosome 5 and chromosome 7 deletions which were not present. Hemoglobin was 9.7 at the time with MCV 90, [place] count 4700, and platelets 162,000. There were no ringed sideroblasts.  She was initially followed with observation alone until hemoglobin fell to 8.6 by June of 2010. She was started on a trial of Aranesp and had a nice response with rise in hemoglobin up to 12.5 g. The patient is noted to have dementia. She has not received any injection for many months as her hemoglobin has been elevated at greater than 10 g. She is not symptomatic. The last Aranesp injection was on 10/04/2014 It was noted that her hemoglobin started to trend down again and Aranesp injection is resumed on 08/27/2018  REVIEW OF SYSTEMS:   Constitutional: Denies fevers, chills or abnormal weight loss Eyes: Denies blurriness of vision Ears, nose, mouth, throat, and face: Denies mucositis or sore throat Respiratory: Denies cough, dyspnea or wheezes Cardiovascular: Denies palpitation, chest discomfort or lower extremity swelling Gastrointestinal:  Denies nausea, heartburn or change in bowel habits Skin: Denies abnormal skin rashes Lymphatics: Denies new lymphadenopathy or easy bruising Neurological:Denies numbness, tingling or new weaknesses Behavioral/Psych: Mood is stable, no new changes  All other systems were reviewed with the patient and are negative.  I have reviewed the past medical history, past surgical  history, social history and family history with the patient and they are unchanged from previous note.  ALLERGIES:  is allergic to lisinopril and penicillins.  MEDICATIONS:  Current Outpatient Medications  Medication Sig Dispense Refill  . acetaminophen (TYLENOL) 500 MG tablet Take 500 mg by mouth every 8 (eight) hours as needed for mild pain.  30 tablet   . aspirin EC 81 MG tablet Take 81 mg by mouth daily.    . Calcium Carbonate-Vit D-Min (CALTRATE 600+D PLUS) 600-400 MG-UNIT per tablet Take 1 tablet by mouth daily.      . Cholecalciferol (VITAMIN D-3 PO) Take 1,000 mg by mouth daily. 1,000mg  one daily    . cycloSPORINE (RESTASIS) 0.05 % ophthalmic emulsion Place 1 drop into both eyes 2 (two) times daily.      Marland Kitchen donepezil (ARICEPT) 10 MG tablet Take 10 mg by mouth daily.    . hydroxypropyl methylcellulose / hypromellose (ISOPTO TEARS / GONIOVISC) 2.5 % ophthalmic solution Place 1 drop into both eyes every 2 (two) hours as needed for dry eyes.    Marland Kitchen levothyroxine (SYNTHROID, LEVOTHROID) 88 MCG tablet TAKE 1 TABLET BY MOUTH ONCE DAILY 90 tablet 1  . losartan (COZAAR) 50 MG tablet Take 1 tablet (50 mg total) by mouth daily. 30 tablet 0  . Multiple Vitamin (MULTIVITAMIN WITH MINERALS) TABS Take 1 tablet by mouth daily.    . Nutritional Supplements (NUTRITIONAL SHAKE PO) Take 120 mLs by mouth 2 (two) times daily.    . polyethylene glycol powder (GLYCOLAX/MIRALAX) powder Take 17 g by mouth daily as needed for mild constipation.     . Sodium Fluoride (PREVIDENT 5000 BOOSTER PLUS) 1.1 % PSTE Place onto teeth.     No current facility-administered medications for this visit.     PHYSICAL EXAMINATION: ECOG PERFORMANCE STATUS: 2 - Symptomatic, <50% confined to bed  Vitals:   08/27/18 1057  BP: (!) 115/51  Pulse: 71  Resp: 18  Temp: 98.2 F (36.8 C)  SpO2: 98%   Filed Weights   08/27/18 1057  Weight: 141 lb 3.2 oz (64 kg)    GENERAL:alert, no distress and comfortable SKIN: skin color,  texture, turgor are normal, no rashes or significant lesions EYES: normal, Conjunctiva are pink and non-injected, sclera clear OROPHARYNX:no exudate, no erythema and lips, buccal mucosa, and tongue normal  NECK: supple, thyroid normal size, non-tender, without nodularity LYMPH:  no palpable lymphadenopathy in the cervical, axillary or inguinal LUNGS: clear to auscultation and percussion with normal breathing effort HEART: regular rate & rhythm and no murmurs and no lower extremity edema ABDOMEN:abdomen soft, non-tender and normal bowel sounds Musculoskeletal:no cyanosis of digits and no clubbing  NEURO: alert & oriented x 3 with fluent speech, no focal motor/sensory deficits  LABORATORY DATA:  I have reviewed the data as listed    Component Value Date/Time   NA 139 08/24/2018   NA 142 08/06/2016 1059   K 5.1 08/24/2018   K 4.4 08/06/2016 1059   CL 114 (H) 08/18/2018 0505   CL 106 08/04/2018   CL 109 (H) 04/20/2013 1320   CO2 22 08/18/2018 0505   CO2 9.7 08/04/2018   CO2 23 08/06/2016 1059   GLUCOSE 99 08/18/2018 0505   GLUCOSE 102 08/06/2016 1059   GLUCOSE 129 (H) 04/20/2013 1320   BUN 33 (A) 08/24/2018   BUN 35.0 (H) 08/06/2016 1059  CREATININE 1.67 08/24/2018   CREATININE 1.5 (H) 08/06/2016 1059   CALCIUM 9.0 08/24/2018   CALCIUM 10.4 08/06/2016 1059   PROT 5.5 08/24/2018   PROT 6.7 08/06/2016 1059   ALBUMIN 3.5 08/24/2018   ALBUMIN 3.9 08/06/2016 1059   AST 15 08/24/2018   AST 19 08/06/2016 1059   ALT 19 08/24/2018   ALT 24 08/06/2016 1059   ALKPHOS 75 08/24/2018   ALKPHOS 69 08/06/2016 1059   BILITOT 0.3 08/24/2018   BILITOT 0.61 08/06/2016 1059   GFRNONAA 26 08/24/2018   GFRAA 52 (L) 08/18/2018 0505    No results found for: SPEP, UPEP  Lab Results  Component Value Date   WBC 5.2 08/27/2018   NEUTROABS 3.1 08/27/2018   HGB 9.6 (L) 08/27/2018   HCT 30.3 (L) 08/27/2018   MCV 85.8 08/27/2018   PLT 375 08/27/2018      Chemistry      Component Value  Date/Time   NA 139 08/24/2018   NA 142 08/06/2016 1059   K 5.1 08/24/2018   K 4.4 08/06/2016 1059   CL 114 (H) 08/18/2018 0505   CL 106 08/04/2018   CL 109 (H) 04/20/2013 1320   CO2 22 08/18/2018 0505   CO2 9.7 08/04/2018   CO2 23 08/06/2016 1059   BUN 33 (A) 08/24/2018   BUN 35.0 (H) 08/06/2016 1059   CREATININE 1.67 08/24/2018   CREATININE 1.5 (H) 08/06/2016 1059   GLU 114 08/04/2018      Component Value Date/Time   CALCIUM 9.0 08/24/2018   CALCIUM 10.4 08/06/2016 1059   ALKPHOS 75 08/24/2018   ALKPHOS 69 08/06/2016 1059   AST 15 08/24/2018   AST 19 08/06/2016 1059   ALT 19 08/24/2018   ALT 24 08/06/2016 1059   BILITOT 0.3 08/24/2018   BILITOT 0.61 08/06/2016 1059       RADIOGRAPHIC STUDIES: I have personally reviewed the radiological images as listed and agreed with the findings in the report. Dg Chest 2 View  Result Date: 08/14/2018 CLINICAL DATA:  Patient with fever and vomiting.  Weakness. EXAM: CHEST - 2 VIEW COMPARISON:  None. FINDINGS: Monitoring leads overlie the patient. Enlarged cardiac and mediastinal contours. Thoracic aortic vascular calcifications. Low lung volumes. Bilateral perihilar interstitial pulmonary opacities. Patchy consolidation medial right lower hemithorax. No pleural effusion or pneumothorax. Thoracic spine degenerative changes. Rightward curvature of the thoracic spine. IMPRESSION: Cardiomegaly. Patchy consolidation medial right lower hemithorax may represent pneumonia or aspiration. Followup PA and lateral chest X-ray is recommended in 3-4 weeks following trial of antibiotic therapy to ensure resolution and exclude underlying malignancy. Electronically Signed   By: Lovey Newcomer M.D.   On: 08/14/2018 21:02   Ct Head Wo Contrast  Result Date: 08/15/2018 CLINICAL DATA:  Altered level of consciousness. EXAM: CT HEAD WITHOUT CONTRAST TECHNIQUE: Contiguous axial images were obtained from the base of the skull through the vertex without intravenous  contrast. COMPARISON:  None. FINDINGS: Brain: Image quality degraded by motion. Study was repeated with better results on the second attempt Moderate atrophy. Patchy hypodensity in the cerebral [place] matter bilaterally appears chronic. No acute infarct, hemorrhage, mass Vascular: Negative for hyperdense vessel Skull: Negative Sinuses/Orbits: Mild mucosal edema sphenoid sinus. Orbits not adequately imaged. Other: None IMPRESSION: Moderate atrophy with mild chronic microvascular ischemia. No acute abnormality. Electronically Signed   By: Franchot Gallo M.D.   On: 08/15/2018 15:00   US Renal  Result Date: 08/16/2018 CLINICAL DATA:  84 year old female with acute kidney injury EXAM: RENAL /  URINARY TRACT ULTRASOUND COMPLETE COMPARISON:  Prior CT abdomen/pelvis 07/08/2012 FINDINGS: Right Kidney: Length: 8.6 cm. Echogenicity within normal limits. No solid mass or hydronephrosis visualized. Small subcentimeter anechoic simple cyst at the lower pole. Left Kidney: Length: 10.5 cm. Echogenicity within normal limits. No solid mass or hydronephrosis visualized. Multiple anechoic cystic structures with posterior acoustic enhancement and imperceptible walls consistent with simple cysts arising from the lower pole of the left kidney. The largest measures 2.4 x 1.4 x 1.9 cm. Bladder: Appears normal for degree of bladder distention. IMPRESSION: 1. No evidence of hydronephrosis or other acute abnormality. 2. Bilateral simple renal cysts. Electronically Signed   By: Jacqulynn Cadet M.D.   On: 08/16/2018 09:51    All questions were answered. The patient knows to call the clinic with any problems, questions or concerns. No barriers to learning was detected.  I spent 15 minutes counseling the patient face to face. The total time spent in the appointment was 20 minutes and more than 50% was on counseling and review of test results  Heath Lark, MD 08/27/2018 12:11 PM

## 2018-08-28 LAB — ERYTHROPOIETIN: ERYTHROPOIETIN: 9.6 m[IU]/mL (ref 2.6–18.5)

## 2018-08-31 LAB — CBC AND DIFFERENTIAL
HEMATOCRIT: 28 — AB (ref 36–46)
Hemoglobin: 9.3 — AB (ref 12.0–16.0)
Platelets: 443 — AB (ref 150–399)
WBC: 7.7

## 2018-09-14 ENCOUNTER — Non-Acute Institutional Stay (SKILLED_NURSING_FACILITY): Payer: Medicare Other | Admitting: Family

## 2018-09-14 ENCOUNTER — Encounter: Payer: Self-pay | Admitting: Family

## 2018-09-14 DIAGNOSIS — W19XXXA Unspecified fall, initial encounter: Secondary | ICD-10-CM

## 2018-09-14 DIAGNOSIS — R2681 Unsteadiness on feet: Secondary | ICD-10-CM | POA: Diagnosis not present

## 2018-09-14 DIAGNOSIS — S81812A Laceration without foreign body, left lower leg, initial encounter: Secondary | ICD-10-CM | POA: Diagnosis not present

## 2018-09-14 DIAGNOSIS — Y92129 Unspecified place in nursing home as the place of occurrence of the external cause: Secondary | ICD-10-CM | POA: Diagnosis not present

## 2018-09-14 NOTE — Progress Notes (Addendum)
Location:  [facility] Room Number: 52 Place of Service:  SNF (31) Provider: Samarion Ehle FNP-C  Blanchie Serve, MD  Patient Care Team: Blanchie Serve, MD as PCP - General (Internal Medicine) Milus Banister, MD as Consulting Physician (Gastroenterology) Heath Lark, MD as Consulting Physician (Hematology and Oncology) Mast, Man X, NP as Nurse Practitioner (Internal Medicine)  Extended Emergency Contact Information Primary Emergency Contact: [facility] Address: [address]          [place], [place] [address] [name] of [place] Phone: [phone] Work Phone: [phone] Mobile Phone: [phone] Relation: Son Secondary Emergency Contact: [name],[name] Address: [place] [address] [name] of [place] Phone: [phone] Relation: Daughter  Code Status:  DNR Goals of care: Advanced Directive information Advanced Directives 08/19/2018  Does Patient Have a Medical Advance Directive? Yes  Type of Paramedic of [place];Out of facility DNR (pink MOST or yellow form)  Does patient want to make changes to medical advance directive? -  Copy of [place] in Chart? Yes  Would patient like information on creating a medical advance directive? -  Pre-existing out of facility DNR order (yellow form or pink MOST form) Yellow form placed in chart (order not valid for inpatient use)     Chief Complaint  Patient presents with  . Acute Visit    fall     HPI:  Pt is a 84 y.o. female seen today at [facility] for an acute visit for evaluation of fall episode x 2.she is seen in her room today with facility Nurse present at bedside.Nurse reports via SBAR that patient was observed on the floor in the dinning room on 09/13/2018 after trying to walk to her room behind her wheel chair using it as a walker.she sustained small skin tear to her left leg.she was also observed on the  bathroom floor 09/14/2018 no acute injuries sustained.Her vital signs and Neuro checks have been stable.Of note she has a medical history of dementia without behavioral disturbance unable to provider HPI and ROS.   Past Medical History:  Diagnosis Date  . Blood transfusion 2006  . Colon polyps   . CVA (cerebral infarction) 2013  . Dementia   . Diverticulosis of colon   . DJD (degenerative joint disease)   . Hyperlipidemia   . Hypertension   . Hypothyroidism   . Melanoma ([place]) 03/20/15   removed with Moh's surgery  . Myelodysplasia 02/04/2012  . Normochromic normocytic anemia 02/04/2012  . Onychomycosis   . Osteoporosis   . Rectal prolapse   . Shoulder pain   . Unstable gait    Past Surgical History:  Procedure Laterality Date  . APPENDECTOMY    . CARPAL TUNNEL RELEASE Bilateral 2006  . FLEXIBLE SIGMOIDOSCOPY  07/09/2012   Procedure: FLEXIBLE SIGMOIDOSCOPY;  Surgeon: Inda Castle, MD;  Location: [facility] ENDOSCOPY;  Service: Endoscopy;  Laterality: N/A;  . MELANOMA EXCISION Right 2005   arm  . POLYPECTOMY  2004   Laparoscopic  . RECTOCELE REPAIR    . REPLACEMENT TOTAL KNEE  2005 & 2006  . VAGINAL HYSTERECTOMY  1989    Allergies  Allergen Reactions  . Lisinopril     REACTION: angioedema  . Penicillins     REACTION: rash    Outpatient Encounter Medications as of 09/14/2018  Medication Sig  . acetaminophen (TYLENOL) 500 MG tablet Take 500 mg by mouth  every 8 (eight) hours as needed for mild pain.   Marland Kitchen aspirin EC 81 MG tablet Take 81 mg by mouth daily.  . Calcium Carbonate-Vit D-Min (CALTRATE 600+D PLUS) 600-400 MG-UNIT per tablet Take 1 tablet by mouth daily.    . Cholecalciferol (VITAMIN D-3 PO) Take 1,000 mg by mouth daily. 1,000mg  one daily  . cycloSPORINE (RESTASIS) 0.05 % ophthalmic emulsion Place 1 drop into both eyes 2 (two) times daily.    Marland Kitchen donepezil (ARICEPT) 10 MG tablet Take 10 mg by mouth daily.  . hydroxypropyl methylcellulose / hypromellose (ISOPTO TEARS /  GONIOVISC) 2.5 % ophthalmic solution Place 1 drop into both eyes every 2 (two) hours as needed for dry eyes.  Marland Kitchen levothyroxine (SYNTHROID, LEVOTHROID) 88 MCG tablet TAKE 1 TABLET BY MOUTH ONCE DAILY  . losartan (COZAAR) 50 MG tablet Take 1 tablet (50 mg total) by mouth daily.  . Multiple Vitamin (MULTIVITAMIN WITH MINERALS) TABS Take 1 tablet by mouth daily.  . Nutritional Supplements (NUTRITIONAL SHAKE PO) Take 120 mLs by mouth 2 (two) times daily.  . polyethylene glycol powder (GLYCOLAX/MIRALAX) powder Take 17 g by mouth daily as needed for mild constipation.   . Sodium Fluoride (PREVIDENT 5000 BOOSTER PLUS) 1.1 % PSTE Place onto teeth.   No facility-administered encounter medications on file as of 09/14/2018.     Review of Systems  Unable to perform ROS: Dementia (additional information provided by facility Nurse )    Immunization History  Administered Date(s) Administered  . Influenza Split 10/17/2011, 09/14/2012  . Influenza Whole 10/17/2007  . Influenza, High Dose Seasonal PF 10/11/2013, 10/05/2014  . Influenza-Unspecified 09/05/2015, 10/02/2016, 10/06/2017  . PPD Test 01/12/2015  . Pneumococcal Conjugate-13 09/29/2014  . Pneumococcal Polysaccharide-23 12/16/1998  . Tdap 04/23/2011  . Zoster 07/04/2006   Pertinent  Health Maintenance Due  Topic Date Due  . OPHTHALMOLOGY EXAM  11/15/2014  . FOOT EXAM  11/30/2014  . HEMOGLOBIN A1C  10/11/2015  . INFLUENZA VACCINE  07/16/2018  . DEXA SCAN  Completed  . PNA vac Low Risk Adult  Completed   Fall Risk  09/02/2017 07/04/2016 01/04/2016 04/06/2015 09/29/2014  Falls in the past year? No No No No No    Vitals:   09/14/18 1413  BP: (!) 131/51  Pulse: (!) 56  Resp: 20  Temp: 97.8 F (36.6 C)  SpO2: 100%  Weight: 144 lb 4.8 oz (65.5 kg)  Height: 4\' 11"  (1.499 m)   Body mass index is 29.15 kg/m. Physical Exam  Constitutional: She appears well-developed and well-nourished. No distress.  HENT:  Head: Normocephalic.  Right Ear:  External ear normal.  Left Ear: External ear normal.  Mouth/Throat: Oropharynx is clear and moist. No oropharyngeal exudate.  Eyes: Pupils are equal, round, and reactive to light. Conjunctivae are normal. Right eye exhibits no discharge. Left eye exhibits no discharge. No scleral icterus.  Neck: Neck supple. No JVD present. No thyromegaly present.  Cardiovascular: Exam reveals no gallop and no friction rub.  Murmur heard. Pulmonary/Chest: Effort normal and breath sounds normal. No respiratory distress. She has no wheezes. She has no rales.  Abdominal: Soft. Bowel sounds are normal. She exhibits no distension and no mass. There is no tenderness. There is no rebound and no guarding.  Musculoskeletal: She exhibits no tenderness.  Moves x 4 extremities except chronic limited ROM to both shoulders right greater than the left.unsteady gait ambulates with walker and uses wheelchair for long distances.   Lymphadenopathy:    She has no cervical adenopathy.  Neurological: Gait abnormal.  Pleasantly confused at her baseline per Nurse   Skin: Skin is warm and dry. No rash noted. No erythema. No pallor.  Left leg small skin tear steri strips intact small amount of dry bright red blood noted.surrounding skin tissue without any signs of infections.   Psychiatric: She has a normal mood and affect. Her speech is normal and behavior is normal. Judgment and thought content normal. She exhibits abnormal recent memory.  Nursing note and vitals reviewed.   Labs reviewed: Recent Labs    08/16/18 0941 08/17/18 0523 08/18/18 0505 08/24/18  NA 144 143 143 139  K 4.3 4.2 4.4 5.1  CL 113* 114* 114*  --   CO2 21* 22 22  --   GLUCOSE 125* 104* 99  --   BUN 27* 22 22 33*  CREATININE 1.28* 1.13* 1.04* 1.67  CALCIUM 8.9 8.8* 8.6* 9.0  MG 1.6*  --   --   --    Recent Labs    08/04/18 08/14/18 2033 08/24/18  AST 14 23 15   ALT 21 23 19   ALKPHOS 92 98 75  BILITOT  --  0.8 0.3  PROT 6.7 7.0 5.5  ALBUMIN 4.0  3.7 3.5   Recent Labs    08/14/18 2033  08/18/18 0505 08/24/18 08/27/18 1029 08/31/18  WBC 19.6*   < > 4.9 4.7 5.2 7.7  NEUTROABS 14.7*  --   --   --  3.1  --   HGB 8.6*   < > 8.6* 8.3 9.6* 9.3*  HCT 26.1*   < > 26.8* 25 30.3* 28*  MCV 84.5   < > 86.7 80.6 85.8  --   PLT 572*   < > 328  --  375 443*   < > = values in this interval not displayed.   Lab Results  Component Value Date   TSH 1.88 08/11/2018   Lab Results  Component Value Date   HGBA1C 5.6 04/11/2015   Lab Results  Component Value Date   CHOL 147 04/22/2017   HDL 47 04/22/2017   LDLCALC 77 04/22/2017   LDLDIRECT 131.2 11/30/2013   TRIG 125 04/22/2017   CHOLHDL 4 11/30/2013    Significant Diagnostic Results in last 30 days:  Ct Head Wo Contrast  Result Date: 08/15/2018 CLINICAL DATA:  Altered level of consciousness. EXAM: CT HEAD WITHOUT CONTRAST TECHNIQUE: Contiguous axial images were obtained from the base of the skull through the vertex without intravenous contrast. COMPARISON:  None. FINDINGS: Brain: Image quality degraded by motion. Study was repeated with better results on the second attempt Moderate atrophy. Patchy hypodensity in the cerebral [place] matter bilaterally appears chronic. No acute infarct, hemorrhage, mass Vascular: Negative for hyperdense vessel Skull: Negative Sinuses/Orbits: Mild mucosal edema sphenoid sinus. Orbits not adequately imaged. Other: None IMPRESSION: Moderate atrophy with mild chronic microvascular ischemia. No acute abnormality. Electronically Signed   By: Franchot Gallo M.D.   On: 08/15/2018 15:00   US Renal  Result Date: 08/16/2018 CLINICAL DATA:  84 year old female with acute kidney injury EXAM: RENAL / URINARY TRACT ULTRASOUND COMPLETE COMPARISON:  Prior CT abdomen/pelvis 07/08/2012 FINDINGS: Right Kidney: Length: 8.6 cm. Echogenicity within normal limits. No solid mass or hydronephrosis visualized. Small subcentimeter anechoic simple cyst at the lower pole. Left Kidney: Length:  10.5 cm. Echogenicity within normal limits. No solid mass or hydronephrosis visualized. Multiple anechoic cystic structures with posterior acoustic enhancement and imperceptible walls consistent with simple cysts arising from the lower pole of the left  kidney. The largest measures 2.4 x 1.4 x 1.9 cm. Bladder: Appears normal for degree of bladder distention. IMPRESSION: 1. No evidence of hydronephrosis or other acute abnormality. 2. Bilateral simple renal cysts. Electronically Signed   By: Jacqulynn Cadet M.D.   On: 08/16/2018 09:51    Assessment/Plan 1. Fall at nursing home, initial encounter Fall episode x 2 on 09/13/2018 and 09/14/2018 no acute injuries.Afebrile.B/p readings reviewed stable.Her unsteady gait,poor safety awareness due to cognitive impairment could be a contributory factor to her fall.check stat CBC/diff and CMP to rule out infectious and metabolic etiologies.  2. Noninfected skin tear of lower extremity, left, initial encounter Afebrile.left leg skin tear with steri-strips intact.No signs of infections.continue to monitor for now.   3. Unsteady gait  vital signs reviewed.continue to work with therapy for exercise,gait stability and muscle strengthening.continue fall and safety precautions.   Family/ staff Communication: Reviewed plan of care with patient and facility Nurse  Labs/tests ordered: stat CBC/diff and CMP  Addendum:  Lab results received 09/15/2018 WBC 17,Absolute Neutrophils 13413,hgb 9.8 ( previous 9.3), BUN 31,CR 1.42 ( previous BUN 33,CR 1.67),AST 53,ALT 78. Portable CXR 2 views to rule out Pneumonia,stat flu swab,obtain urine specimen for U/A and C/S to rule out UTI.Monitor vital signs every 4 hours x 48 hours.Monitor temp curve.Patient's lab results discussed with Dr.Miller present in the facility reocommends not treating for now pending urine results.   Sandrea Hughs, NP

## 2018-09-16 ENCOUNTER — Encounter (HOSPITAL_COMMUNITY): Payer: Self-pay

## 2018-09-16 ENCOUNTER — Emergency Department (HOSPITAL_COMMUNITY): Payer: Medicare Other

## 2018-09-16 ENCOUNTER — Inpatient Hospital Stay (HOSPITAL_COMMUNITY)
Admission: EM | Admit: 2018-09-16 | Discharge: 2018-09-25 | DRG: 853 | Disposition: A | Discharge status: Skilled Nursing Facility | Payer: Medicare Other | Source: Skilled Nursing Facility | Attending: Internal Medicine | Admitting: Internal Medicine

## 2018-09-16 DIAGNOSIS — K6289 Other specified diseases of anus and rectum: Secondary | ICD-10-CM | POA: Diagnosis not present

## 2018-09-16 DIAGNOSIS — M1990 Unspecified osteoarthritis, unspecified site: Secondary | ICD-10-CM | POA: Diagnosis present

## 2018-09-16 DIAGNOSIS — D469 Myelodysplastic syndrome, unspecified: Secondary | ICD-10-CM | POA: Diagnosis present

## 2018-09-16 DIAGNOSIS — L03317 Cellulitis of buttock: Secondary | ICD-10-CM | POA: Diagnosis present

## 2018-09-16 DIAGNOSIS — R159 Full incontinence of feces: Secondary | ICD-10-CM | POA: Diagnosis present

## 2018-09-16 DIAGNOSIS — Z79899 Other long term (current) drug therapy: Secondary | ICD-10-CM

## 2018-09-16 DIAGNOSIS — R8281 Pyuria: Secondary | ICD-10-CM | POA: Diagnosis not present

## 2018-09-16 DIAGNOSIS — Z515 Encounter for palliative care: Secondary | ICD-10-CM | POA: Diagnosis present

## 2018-09-16 DIAGNOSIS — R627 Adult failure to thrive: Secondary | ICD-10-CM | POA: Diagnosis present

## 2018-09-16 DIAGNOSIS — K611 Rectal abscess: Secondary | ICD-10-CM | POA: Diagnosis present

## 2018-09-16 DIAGNOSIS — E876 Hypokalemia: Secondary | ICD-10-CM | POA: Diagnosis present

## 2018-09-16 DIAGNOSIS — Z8042 Family history of malignant neoplasm of prostate: Secondary | ICD-10-CM

## 2018-09-16 DIAGNOSIS — A419 Sepsis, unspecified organism: Principal | ICD-10-CM | POA: Diagnosis present

## 2018-09-16 DIAGNOSIS — Z79890 Hormone replacement therapy (postmenopausal): Secondary | ICD-10-CM

## 2018-09-16 DIAGNOSIS — I10 Essential (primary) hypertension: Secondary | ICD-10-CM | POA: Diagnosis not present

## 2018-09-16 DIAGNOSIS — N184 Chronic kidney disease, stage 4 (severe): Secondary | ICD-10-CM | POA: Diagnosis not present

## 2018-09-16 DIAGNOSIS — Z8051 Family history of malignant neoplasm of kidney: Secondary | ICD-10-CM

## 2018-09-16 DIAGNOSIS — D61818 Other pancytopenia: Secondary | ICD-10-CM | POA: Diagnosis present

## 2018-09-16 DIAGNOSIS — Z96653 Presence of artificial knee joint, bilateral: Secondary | ICD-10-CM | POA: Diagnosis not present

## 2018-09-16 DIAGNOSIS — J690 Pneumonitis due to inhalation of food and vomit: Secondary | ICD-10-CM | POA: Diagnosis present

## 2018-09-16 DIAGNOSIS — Z87440 Personal history of urinary (tract) infections: Secondary | ICD-10-CM

## 2018-09-16 DIAGNOSIS — W19XXXD Unspecified fall, subsequent encounter: Secondary | ICD-10-CM | POA: Diagnosis not present

## 2018-09-16 DIAGNOSIS — E440 Moderate protein-calorie malnutrition: Secondary | ICD-10-CM | POA: Diagnosis present

## 2018-09-16 DIAGNOSIS — Z8673 Personal history of transient ischemic attack (TIA), and cerebral infarction without residual deficits: Secondary | ICD-10-CM

## 2018-09-16 DIAGNOSIS — R231 Pallor: Secondary | ICD-10-CM | POA: Diagnosis not present

## 2018-09-16 DIAGNOSIS — F0150 Vascular dementia without behavioral disturbance: Secondary | ICD-10-CM | POA: Diagnosis present

## 2018-09-16 DIAGNOSIS — R32 Unspecified urinary incontinence: Secondary | ICD-10-CM | POA: Diagnosis not present

## 2018-09-16 DIAGNOSIS — L249 Irritant contact dermatitis, unspecified cause: Secondary | ICD-10-CM | POA: Diagnosis not present

## 2018-09-16 DIAGNOSIS — Z880 Allergy status to penicillin: Secondary | ICD-10-CM | POA: Diagnosis not present

## 2018-09-16 DIAGNOSIS — N390 Urinary tract infection, site not specified: Secondary | ICD-10-CM | POA: Insufficient documentation

## 2018-09-16 DIAGNOSIS — Z803 Family history of malignant neoplasm of breast: Secondary | ICD-10-CM

## 2018-09-16 DIAGNOSIS — L0231 Cutaneous abscess of buttock: Secondary | ICD-10-CM | POA: Diagnosis not present

## 2018-09-16 DIAGNOSIS — I129 Hypertensive chronic kidney disease with stage 1 through stage 4 chronic kidney disease, or unspecified chronic kidney disease: Secondary | ICD-10-CM | POA: Diagnosis present

## 2018-09-16 DIAGNOSIS — M810 Age-related osteoporosis without current pathological fracture: Secondary | ICD-10-CM | POA: Diagnosis present

## 2018-09-16 DIAGNOSIS — B372 Candidiasis of skin and nail: Secondary | ICD-10-CM | POA: Diagnosis not present

## 2018-09-16 DIAGNOSIS — Z6831 Body mass index (BMI) 31.0-31.9, adult: Secondary | ICD-10-CM | POA: Diagnosis not present

## 2018-09-16 DIAGNOSIS — E785 Hyperlipidemia, unspecified: Secondary | ICD-10-CM | POA: Diagnosis present

## 2018-09-16 DIAGNOSIS — I480 Paroxysmal atrial fibrillation: Secondary | ICD-10-CM | POA: Diagnosis present

## 2018-09-16 DIAGNOSIS — Z9181 History of falling: Secondary | ICD-10-CM | POA: Diagnosis not present

## 2018-09-16 DIAGNOSIS — Z8249 Family history of ischemic heart disease and other diseases of the circulatory system: Secondary | ICD-10-CM

## 2018-09-16 DIAGNOSIS — N183 Chronic kidney disease, stage 3 unspecified: Secondary | ICD-10-CM | POA: Diagnosis present

## 2018-09-16 DIAGNOSIS — F0390 Unspecified dementia without behavioral disturbance: Secondary | ICD-10-CM | POA: Diagnosis not present

## 2018-09-16 DIAGNOSIS — R509 Fever, unspecified: Secondary | ICD-10-CM | POA: Diagnosis not present

## 2018-09-16 DIAGNOSIS — Z66 Do not resuscitate: Secondary | ICD-10-CM | POA: Diagnosis present

## 2018-09-16 DIAGNOSIS — R6520 Severe sepsis without septic shock: Secondary | ICD-10-CM | POA: Diagnosis not present

## 2018-09-16 DIAGNOSIS — R410 Disorientation, unspecified: Secondary | ICD-10-CM | POA: Diagnosis not present

## 2018-09-16 DIAGNOSIS — N179 Acute kidney failure, unspecified: Secondary | ICD-10-CM | POA: Diagnosis present

## 2018-09-16 DIAGNOSIS — Z7982 Long term (current) use of aspirin: Secondary | ICD-10-CM

## 2018-09-16 DIAGNOSIS — D696 Thrombocytopenia, unspecified: Secondary | ICD-10-CM | POA: Diagnosis not present

## 2018-09-16 DIAGNOSIS — K5792 Diverticulitis of intestine, part unspecified, without perforation or abscess without bleeding: Secondary | ICD-10-CM | POA: Diagnosis present

## 2018-09-16 DIAGNOSIS — Z90710 Acquired absence of both cervix and uterus: Secondary | ICD-10-CM

## 2018-09-16 DIAGNOSIS — Z9889 Other specified postprocedural states: Secondary | ICD-10-CM | POA: Diagnosis not present

## 2018-09-16 DIAGNOSIS — R001 Bradycardia, unspecified: Secondary | ICD-10-CM | POA: Diagnosis not present

## 2018-09-16 DIAGNOSIS — S81812D Laceration without foreign body, left lower leg, subsequent encounter: Secondary | ICD-10-CM | POA: Diagnosis not present

## 2018-09-16 DIAGNOSIS — E869 Volume depletion, unspecified: Secondary | ICD-10-CM | POA: Diagnosis present

## 2018-09-16 DIAGNOSIS — Z888 Allergy status to other drugs, medicaments and biological substances status: Secondary | ICD-10-CM | POA: Diagnosis not present

## 2018-09-16 DIAGNOSIS — Z96659 Presence of unspecified artificial knee joint: Secondary | ICD-10-CM | POA: Diagnosis present

## 2018-09-16 DIAGNOSIS — G9341 Metabolic encephalopathy: Secondary | ICD-10-CM | POA: Diagnosis not present

## 2018-09-16 DIAGNOSIS — E039 Hypothyroidism, unspecified: Secondary | ICD-10-CM | POA: Diagnosis present

## 2018-09-16 DIAGNOSIS — Z8701 Personal history of pneumonia (recurrent): Secondary | ICD-10-CM

## 2018-09-16 DIAGNOSIS — Z85828 Personal history of other malignant neoplasm of skin: Secondary | ICD-10-CM

## 2018-09-16 LAB — COMPREHENSIVE METABOLIC PANEL
ALT: 77 U/L — ABNORMAL HIGH (ref 0–44)
ANION GAP: 11 (ref 5–15)
AST: 57 U/L — ABNORMAL HIGH (ref 15–41)
Albumin: 2.9 g/dL — ABNORMAL LOW (ref 3.5–5.0)
Alkaline Phosphatase: 104 U/L (ref 38–126)
BUN: 41 mg/dL — AB (ref 8–23)
CALCIUM: 8.5 mg/dL — AB (ref 8.9–10.3)
CHLORIDE: 107 mmol/L (ref 98–111)
CO2: 20 mmol/L — AB (ref 22–32)
CREATININE: 2.06 mg/dL — AB (ref 0.44–1.00)
GFR, EST AFRICAN AMERICAN: 23 mL/min — AB (ref >60)
GFR, EST NON AFRICAN AMERICAN: 20 mL/min — AB (ref >60)
GLUCOSE: 138 mg/dL — AB (ref 70–99)
Potassium: 4.4 mmol/L (ref 3.5–5.1)
SODIUM: 138 mmol/L (ref 135–145)
Total Bilirubin: 1.2 mg/dL (ref 0.3–1.2)
Total Protein: 5.8 g/dL — ABNORMAL LOW (ref 6.5–8.1)

## 2018-09-16 LAB — CBC WITH DIFFERENTIAL/PLATELET
BASOS PCT: 0 %
Basophils Absolute: 0 10*3/uL (ref 0.0–0.1)
EOS PCT: 0 %
Eosinophils Absolute: 0 10*3/uL (ref 0.0–0.7)
HEMATOCRIT: 27.3 % — AB (ref 36.0–46.0)
Hemoglobin: 8.4 g/dL — ABNORMAL LOW (ref 12.0–15.0)
LYMPHS ABS: 1.4 10*3/uL (ref 0.7–4.0)
Lymphocytes Relative: 12 %
MCH: 27.6 pg (ref 26.0–34.0)
MCHC: 30.8 g/dL (ref 30.0–36.0)
MCV: 89.8 fL (ref 78.0–100.0)
MONO ABS: 1.6 10*3/uL — AB (ref 0.1–1.0)
MONOS PCT: 13 %
NEUTROS ABS: 9 10*3/uL — AB (ref 1.7–7.7)
Neutrophils Relative %: 75 %
Platelets: 215 10*3/uL (ref 150–400)
RBC: 3.04 MIL/uL — AB (ref 3.87–5.11)
RDW: 17.9 % — AB (ref 11.5–15.5)
WBC: 12 10*3/uL — AB (ref 4.0–10.5)

## 2018-09-16 LAB — C DIFFICILE QUICK SCREEN W PCR REFLEX
C DIFFICLE (CDIFF) ANTIGEN: NEGATIVE
C Diff interpretation: NOT DETECTED
C Diff toxin: NEGATIVE

## 2018-09-16 LAB — PROTIME-INR
INR: 1.2
Prothrombin Time: 15.1 seconds (ref 11.4–15.2)

## 2018-09-16 LAB — I-STAT CG4 LACTIC ACID, ED: Lactic Acid, Venous: 0.98 mmol/L (ref 0.5–1.9)

## 2018-09-16 LAB — PROCALCITONIN: PROCALCITONIN: 1.62 ng/mL

## 2018-09-16 MED ORDER — VANCOMYCIN HCL IN DEXTROSE 1-5 GM/200ML-% IV SOLN
1000.0000 mg | Freq: Once | INTRAVENOUS | Status: AC
  Administered 2018-09-16: 1000 mg via INTRAVENOUS
  Filled 2018-09-16: qty 200

## 2018-09-16 MED ORDER — SODIUM CHLORIDE 0.9 % IV SOLN
2.0000 g | Freq: Once | INTRAVENOUS | Status: DC
  Filled 2018-09-16: qty 2

## 2018-09-16 MED ORDER — LACTATED RINGERS IV BOLUS (SEPSIS)
1000.0000 mL | Freq: Once | INTRAVENOUS | Status: AC
  Administered 2018-09-16: 1000 mL via INTRAVENOUS

## 2018-09-16 MED ORDER — ACETAMINOPHEN 650 MG RE SUPP: 650.0000 mg | Freq: Four times a day (QID) | RECTAL | Status: DC | PRN

## 2018-09-16 MED ORDER — ENOXAPARIN SODIUM 30 MG/0.3ML ~~LOC~~ SOLN
30.0000 mg | SUBCUTANEOUS | Status: DC
  Administered 2018-09-16: 30 mg via SUBCUTANEOUS
  Filled 2018-09-16 (×2): qty 0.3

## 2018-09-16 MED ORDER — VANCOMYCIN HCL IN DEXTROSE 1-5 GM/200ML-% IV SOLN: 1000.0000 mg | INTRAVENOUS | Status: DC

## 2018-09-16 MED ORDER — SODIUM CHLORIDE 0.9 % IV SOLN
Freq: Once | INTRAVENOUS | Status: AC
  Administered 2018-09-16: 03:00:00 via INTRAVENOUS

## 2018-09-16 MED ORDER — ONDANSETRON HCL 4 MG/2ML IJ SOLN
4.0000 mg | Freq: Four times a day (QID) | INTRAMUSCULAR | Status: DC | PRN
  Administered 2018-09-21: 4 mg via INTRAVENOUS

## 2018-09-16 MED ORDER — SODIUM FLUORIDE 1.1 % DT PSTE: 1.0000 "application " | PASTE | Freq: Every day | DENTAL | Status: DC

## 2018-09-16 MED ORDER — LACTATED RINGERS IV BOLUS
2000.0000 mL | Freq: Once | INTRAVENOUS | Status: AC
  Administered 2018-09-16: 2000 mL via INTRAVENOUS

## 2018-09-16 MED ORDER — LACTATED RINGERS IV SOLN
INTRAVENOUS | Status: DC
  Administered 2018-09-17 – 2018-09-19 (×3): via INTRAVENOUS

## 2018-09-16 MED ORDER — LACTATED RINGERS IV BOLUS (SEPSIS): 250.0000 mL | Freq: Once | INTRAVENOUS | Status: AC

## 2018-09-16 MED ORDER — SODIUM CHLORIDE 0.9 % IV SOLN
1.0000 g | INTRAVENOUS | Status: DC
  Administered 2018-09-17 – 2018-09-18 (×2): 1 g via INTRAVENOUS
  Filled 2018-09-16 (×2): qty 1

## 2018-09-16 MED ORDER — POLYVINYL ALCOHOL 1.4 % OP SOLN
1.0000 [drp] | OPHTHALMIC | Status: DC | PRN
  Filled 2018-09-16: qty 15

## 2018-09-16 MED ORDER — SODIUM CHLORIDE 0.9 % IV SOLN
1.0000 g | Freq: Once | INTRAVENOUS | Status: AC
  Administered 2018-09-16: 1 g via INTRAVENOUS
  Filled 2018-09-16: qty 10

## 2018-09-16 MED ORDER — METRONIDAZOLE IN NACL 5-0.79 MG/ML-% IV SOLN
500.0000 mg | Freq: Once | INTRAVENOUS | Status: AC
  Administered 2018-09-16: 500 mg via INTRAVENOUS
  Filled 2018-09-16: qty 100

## 2018-09-16 MED ORDER — ACETAMINOPHEN 650 MG RE SUPP
650.0000 mg | Freq: Once | RECTAL | Status: DC
  Filled 2018-09-16: qty 1

## 2018-09-16 MED ORDER — SODIUM CHLORIDE 0.9% FLUSH
3.0000 mL | Freq: Two times a day (BID) | INTRAVENOUS | Status: DC
  Administered 2018-09-16 – 2018-09-25 (×14): 3 mL via INTRAVENOUS

## 2018-09-16 MED ORDER — ACETAMINOPHEN 650 MG RE SUPP
650.0000 mg | RECTAL | Status: DC | PRN
  Administered 2018-09-16 – 2018-09-20 (×14): 650 mg via RECTAL
  Filled 2018-09-16 (×13): qty 1

## 2018-09-16 MED ORDER — HYDRALAZINE HCL 20 MG/ML IJ SOLN: 5.0000 mg | INTRAMUSCULAR | Status: DC | PRN

## 2018-09-16 MED ORDER — CYCLOSPORINE 0.05 % OP EMUL
1.0000 [drp] | Freq: Two times a day (BID) | OPHTHALMIC | Status: DC
  Administered 2018-09-16 – 2018-09-25 (×18): 1 [drp] via OPHTHALMIC
  Filled 2018-09-16 (×20): qty 1

## 2018-09-16 MED ORDER — METRONIDAZOLE IN NACL 5-0.79 MG/ML-% IV SOLN
500.0000 mg | Freq: Three times a day (TID) | INTRAVENOUS | Status: DC
  Administered 2018-09-16 – 2018-09-20 (×12): 500 mg via INTRAVENOUS
  Filled 2018-09-16 (×13): qty 100

## 2018-09-16 MED ORDER — ONDANSETRON HCL 4 MG PO TABS: 4.0000 mg | ORAL_TABLET | Freq: Four times a day (QID) | ORAL | Status: DC | PRN

## 2018-09-16 MED ORDER — ACETAMINOPHEN 325 MG PO TABS: 650.0000 mg | ORAL_TABLET | Freq: Four times a day (QID) | ORAL | Status: DC | PRN

## 2018-09-16 NOTE — Progress Notes (Signed)
Pharmacy Antibiotic Note  Sierra Spargo is a 84 y.o. female admitted on 09/16/2018 with sepsis.  Plan: Cefepime 2 g x 1 then 1 g q24h Metronidazole 500 mg q8 Vanc 1 g q48 h Monitor renal fx cx vt prn     Temp (24hrs), Avg:100.4 F (38 C), Min:100.4 F (38 C), Max:100.4 F (38 C)  Recent Labs  Lab 09/16/18 0050 09/16/18 0128  WBC 12.0*  --   CREATININE 2.06*  --   LATICACIDVEN  --  0.98    Estimated Creatinine Clearance: 14.3 mL/min (A) (by C-G formula based on SCr of 2.06 mg/dL (H)).    Allergies  Allergen Reactions  . Lisinopril     REACTION: angioedema  . Penicillins     REACTION: rash   Levester Fresh, PharmD, BCPS, BCCCP Clinical Pharmacist [phone]  Please check AMION for all [place] numbers  09/16/2018 9:18 AM

## 2018-09-16 NOTE — ED Triage Notes (Signed)
Pt comes from [place] via [facility], was there with aspiration pneumonia, this weekend went with family by care to [place]. Today staff reported new AMS, fever 103, unknown LSN. Pt had blood work two days ago and WBC of 17. Neuro intact bilaterally. Hx of dementia. Pt is a DNR

## 2018-09-16 NOTE — ED Notes (Signed)
Pt incontinent of bowel x 2, large watery stool while attempting in and out cath, stool appears to be coming from vaginal area, possible fissure, MD aware.

## 2018-09-16 NOTE — Evaluation (Signed)
Clinical/Bedside Swallow Evaluation Patient Details  Name: Brittany [name] MRN: [id number] Date of Birth: March 29, 1924  Today's Date: 09/16/2018 Time: SLP Start Time (ACUTE ONLY): 1414 SLP Stop Time (ACUTE ONLY): 1445 SLP Time Calculation (min) (ACUTE ONLY): 31 min  Past Medical History:  Past Medical History:  Diagnosis Date  . Blood transfusion 2006  . Colon polyps   . CVA (cerebral infarction) 2013  . Dementia ([place])   . Diverticulosis of colon   . DJD (degenerative joint disease)   . Hyperlipidemia   . Hypertension   . Hypothyroidism   . Melanoma ([place]) 03/20/15   removed with Moh's surgery  . Myelodysplasia 02/04/2012  . Normochromic normocytic anemia 02/04/2012  . Onychomycosis   . Osteoporosis   . Rectal prolapse   . Shoulder pain   . Unstable gait    Past Surgical History:  Past Surgical History:  Procedure Laterality Date  . APPENDECTOMY    . CARPAL TUNNEL RELEASE Bilateral 2006  . FLEXIBLE SIGMOIDOSCOPY  07/09/2012   Procedure: FLEXIBLE SIGMOIDOSCOPY;  Surgeon: Inda Castle, MD;  Location: [facility];  Service: Endoscopy;  Laterality: N/A;  . MELANOMA EXCISION Right 2005   arm  . POLYPECTOMY  2004   Laparoscopic  . RECTOCELE REPAIR    . REPLACEMENT TOTAL KNEE  2005 & 2006  . VAGINAL HYSTERECTOMY  1989   HPI:  Pt is a 84 y.o. female with medical history significant of dementia (?baseline oriented to self); myelodysplasia; hypothyroidism; HTN; HLD; stage 3 CKD; and h/o CVA (2013) presenting with fever and AMS. CXR concerning for RLL PNA. CT abdomen suspiscious for acute distal sigmoid diverticulitis. BSE 08/15/18 recommended NPO due to mentation, but on next date SLP reassessed and recommended regular diet and thin liquids as swallow appeared functional.    Assessment / Plan / Recommendation Clinical Impression  Pt had two instances of throat clearing - once on initial bolus and then later after eating most of a container of applesauce. Otherwise, her swallow appears  functional and no clear signs of aspiration are observed clinically. Her son is present and says that he has never seen signs of trouble swallowing before, even after spending this past weekend traveling with her. If aspiration is occurring, it is likely episodic (and infrequent), or silent. We discussed options, including starting a PO diet and/or pursuing instrumental testing. He said that she was briefly on thickened liquids for one day upon admission to SNF after recent hospital stay, and that she would not drink them. At this point he would like to start a PO diet with careful monitoring. He would like to speak with his brother and sister-in-law (a retired Engineer, drilling) and consider MBS further. For now, will start Dys 3 diet and thin liquids. Could provide meds whole in puree as an additional precaution. SLP Visit Diagnosis: Dysphagia, unspecified (R13.10)    Aspiration Risk  Mild aspiration risk    Diet Recommendation Dysphagia 3 (Mech soft);Thin liquid   Liquid Administration via: Cup;Straw Medication Administration: Whole meds with puree Supervision: Patient able to self feed;Full supervision/cueing for compensatory strategies Compensations: Slow rate;Small sips/bites;Minimize environmental distractions Postural Changes: Seated upright at 90 degrees    Other  Recommendations Oral Care Recommendations: Oral care BID   Follow up Recommendations Skilled Nursing facility      Frequency and Duration min 2x/week  2 weeks       Prognosis Prognosis for Safe Diet Advancement: Good Barriers to Reach Goals: Cognitive deficits      Swallow Study  General HPI: Pt is a 84 y.o. female with medical history significant of dementia (?baseline oriented to self); myelodysplasia; hypothyroidism; HTN; HLD; stage 3 CKD; and h/o CVA (2013) presenting with fever and AMS. CXR concerning for RLL PNA. CT abdomen suspiscious for acute distal sigmoid diverticulitis. BSE 08/15/18 recommended NPO due to  mentation, but on next date SLP reassessed and recommended regular diet and thin liquids as swallow appeared functional.  Type of Study: Bedside Swallow Evaluation Previous Swallow Assessment: see HPI Diet Prior to this Study: NPO Temperature Spikes Noted: Yes Respiratory Status: Nasal cannula History of Recent Intubation: No Behavior/Cognition: Alert;Cooperative;Requires cueing Oral Cavity Assessment: Dry Oral Care Completed by SLP: Recent completion by staff Oral Cavity - Dentition: Adequate natural dentition Vision: Functional for self-feeding Self-Feeding Abilities: Able to feed self Patient Positioning: Upright in bed Baseline Vocal Quality: Normal Volitional Cough: Strong Volitional Swallow: Able to elicit    Oral/Motor/Sensory Function Overall Oral Motor/Sensory Function: Within functional limits   Ice Chips Ice chips: Impaired Presentation: Spoon Pharyngeal Phase Impairments: Throat Clearing - Immediate(on initial swallow)   Thin Liquid Thin Liquid: Within functional limits Presentation: Cup;Self Fed;Straw    Nectar Thick Nectar Thick Liquid: Not tested   Honey Thick Honey Thick Liquid: Not tested   Puree Puree: Impaired Presentation: Self Fed;Spoon Pharyngeal Phase Impairments: Throat Clearing - Immediate(x1)   Solid     Solid: Within functional limits Presentation: Self [name] Forts 09/16/2018,3:09 PM  Brittany [name], M.A. [place] Acute Environmental education officer [phone] Office [phone]

## 2018-09-16 NOTE — H&P (Signed)
History and Physical    Brittany [name] YOV:[id number] DOB: 05-19-1924 DOA: 09/16/2018  PCP: Blanchie Serve, MD  Patient coming from: [facility]  Chief Complaint: AMS  HPI: Brittany [name] is a 84 y.o. female with medical history significant of dementia (?baseline oriented to self); myelodysplasia; hypothyroidism; HTN; HLD; stage 3 CKD; and h/o CVA (2013) presenting with fever and AMS.   Patient was unaccompanied and unable to provide history.  HPI per Dr. Leonette Monarch: Triage Note  0040 Pt comes from [place] via [facility], was there with aspiration pneumonia, this weekend went with family by care to [place]. Today staff reported new AMS, fever 103, unknown LSN. Pt had blood work two days ago and WBC of 17. Neuro intact bilaterally. Hx of dementia. Pt is a DNR     She was hospitalized 8/30-08/18/18 with sepsis due to aspiration PNA and UTI with Enterobacter and E. Coli bacteremia resistant only to Cipro.    ED Course:  Fever last night to 102-103, lethargic, AMS.  8/19 admission for PNA and UTI. .  With I/O cath, fecal matter in vagina, ?fistula.  CT ?fistula, +diverticulitis, C diff negative.  CXR with RLL PNA.  Given Rocephin and Flagyl.  Review of Systems: Unable to assess  PMH, PSH, SH, and FH were reviewed in Epic  Past Medical History:  Diagnosis Date  . Blood transfusion 2006  . Colon polyps   . CVA (cerebral infarction) 2013  . Dementia ([place])   . Diverticulosis of colon   . DJD (degenerative joint disease)   . Hyperlipidemia   . Hypertension   . Hypothyroidism   . Melanoma ([place]) 03/20/15   removed with Moh's surgery  . Myelodysplasia 02/04/2012  . Normochromic normocytic anemia 02/04/2012  . Onychomycosis   . Osteoporosis   . Rectal prolapse   . Shoulder pain   . Unstable gait     Past Surgical History:  Procedure Laterality Date  . APPENDECTOMY    . CARPAL TUNNEL RELEASE Bilateral 2006  . FLEXIBLE SIGMOIDOSCOPY  07/09/2012   Procedure: FLEXIBLE SIGMOIDOSCOPY;   Surgeon: Inda Castle, MD;  Location: [facility];  Service: Endoscopy;  Laterality: N/A;  . MELANOMA EXCISION Right 2005   arm  . POLYPECTOMY  2004   Laparoscopic  . RECTOCELE REPAIR    . REPLACEMENT TOTAL KNEE  2005 & 2006  . VAGINAL HYSTERECTOMY  1989    Social History   Socioeconomic History  . Marital status: Married    Spouse name: Not on file  . Number of children: Not on file  . Years of education: Not on file  . Highest education level: Not on file  Occupational History  . Occupation: English as a second language teacher  Social Needs  . Financial resource strain: Not hard at all  . Food insecurity:    Worry: Never true    Inability: Never true  . Transportation needs:    Medical: No    Non-medical: No  Tobacco Use  . Smoking status: Never Smoker  . Smokeless tobacco: Never Used  Substance and Sexual Activity  . Alcohol use: No  . Drug use: No  . Sexual activity: Yes    Birth control/protection: Surgical    Comment: HYST  Lifestyle  . Physical activity:    Days per week: 0 days    Minutes per session: 0 min  . Stress: Not on file  Relationships  . Social connections:    Talks on phone: Not on file  Gets together: Not on file    Attends religious service: Not on file    Active member of club or organization: Not on file    Attends meetings of clubs or organizations: Not on file    Relationship status: Not on file  . Intimate partner violence:    Fear of current or ex partner: Not on file    Emotionally abused: Not on file    Physically abused: Not on file    Forced sexual activity: Not on file  Other Topics Concern  . Not on file  Social History Narrative   L[facility] at [facility], moved to [place] 12/28/14   married   Never smoked   Alcohol none   Caffeine Coffee 1 cup   Exercise none    Allergies  Allergen Reactions  . Lisinopril     REACTION: angioedema  . Penicillins     REACTION: rash    Family History  Problem Relation Age of Onset  .  Prostate cancer Brother   . Cancer Brother        renal cell carcinoma  . Heart disease Brother   . Breast cancer Mother 64  . Cancer Father        unknown kind  . Arthritis Daughter     Prior to Admission medications   Medication Sig Start Date End Date Taking? Authorizing Provider  acetaminophen (TYLENOL) 500 MG tablet Take 500 mg by mouth every 8 (eight) hours as needed for mild pain.  05/31/13  Yes Marletta Lor, MD  aspirin EC 81 MG tablet Take 81 mg by mouth daily.   Yes [provider]  Calcium Carbonate-Vit D-Min (CALTRATE 600+D PLUS) 600-400 MG-UNIT per tablet Take 1 tablet by mouth daily.     Yes [provider]  Cholecalciferol (VITAMIN D-3 PO) Take 1,000 mg by mouth daily.    Yes [provider]  cycloSPORINE (RESTASIS) 0.05 % ophthalmic emulsion Place 1 drop into both eyes 2 (two) times daily.     Yes [provider]  donepezil (ARICEPT) 10 MG tablet Take 10 mg by mouth daily.   Yes [provider]  hydroxypropyl methylcellulose / hypromellose (ISOPTO TEARS / GONIOVISC) 2.5 % ophthalmic solution Place 1 drop into both eyes every 2 (two) hours as needed for dry eyes.   Yes [provider]  levothyroxine (SYNTHROID, LEVOTHROID) 88 MCG tablet TAKE 1 TABLET BY MOUTH ONCE DAILY Patient taking differently: Take 88 mcg by mouth daily before breakfast.  12/12/14  Yes Marletta Lor, MD  losartan (COZAAR) 50 MG tablet Take 1 tablet (50 mg total) by mouth daily. 08/19/18  Yes Lavina Hamman, MD  Multiple Vitamin (MULTIVITAMIN WITH MINERALS) TABS Take 1 tablet by mouth daily.   Yes [provider]  Nutritional Supplements (NUTRITIONAL SHAKE PO) Take 120 mLs by mouth 2 (two) times daily.   Yes [provider]  polyethylene glycol powder (GLYCOLAX/MIRALAX) powder Take 17 g by mouth daily as needed for mild constipation.    Yes [provider]  Sodium Fluoride (PREVIDENT 5000 BOOSTER PLUS) 1.1 % PSTE  Place 1 application onto teeth daily.    Yes [provider]    Physical Exam: Vitals:   09/16/18 0815 09/16/18 0915 09/16/18 0930 09/16/18 0954  BP: (!) 124/40 (!) 113/48 (!) 115/42   Pulse: (!) 114 100 (!) 104   Resp: (!) 25 (!) 32 (!) 27   Temp:    (!) 102.4 F (39.1 C)  TempSrc:    Rectal  SpO2: 96% 90% 94%      General: Appears acutely on chronically ill and feeble Eyes:  Opens eyes to voice, touch ENT:  grossly normal hearing, lips & tongue Neck:  no LAD, masses or thyromegaly; no carotid bruits Cardiovascular:  Mild tachycardia with PVCs, no m/r/g.  Respiratory:   Tachypnea with scattered rhonchi. Abdomen:  soft, NT, ND, NABS Skin:  no rash or induration seen on limited exam; she has reported marked erythema and excoriation on her buttocks Musculoskeletal: atrophic BUE/BLE, no bony abnormality Lower extremity:  Trace LE edema.  Limited foot exam with no ulcerations.  2+ distal pulses. Psychiatric: Opens eyes to voice, touch, without attempt at conversation or engaging Neurologic: Unable to perform   Radiological Exams on Admission: Ct Abdomen Pelvis Wo Contrast  Result Date: 09/16/2018 CLINICAL DATA:  Fever of unknown origin. Assess for enterovaginal fistula. History of rectal prolapse post rectocele repair. EXAM: CT ABDOMEN AND PELVIS WITHOUT CONTRAST TECHNIQUE: Multidetector CT imaging of the abdomen and pelvis was performed following the standard protocol without IV contrast. Attempted administration of rectal contrast, however patient did not tolerate. COMPARISON:  CT 07/08/2012.  Renal ultrasound 08/16/2018 FINDINGS: Lower chest: Motion artifact with minor atelectasis. Aortic atherosclerosis. Heart size normal. Small hiatal hernia. Hepatobiliary: Mild motion artifact through the liver and gallbladder. No focal hepatic abnormality. No calcified gallstone or pericholecystic inflammation. No biliary dilatation. Pancreas: Parenchymal atrophy. No ductal dilatation or  inflammation. Spleen: Normal in size without focal abnormality. Adrenals/Urinary Tract: Mild adrenal thickening without dominant mass. No hydronephrosis. Mild right perinephric edema. Probable peripheral calcified cyst in the upper right kidney measuring 18 mm. Cyst in the lower left kidney has faint peripheral calcifications. Additional cortical cysts in both kidneys suspected. Urinary bladder is physiologically distended without wall thickening. Stomach/Bowel: Motion artifact and lack of oral contrast limits detailed bowel assessment. Minimal enteric contrast within the rectum and distal sigmoid colon. No rectal contrast in the vagina. Distal colonic diverticulosis, advanced in the sigmoid colon. There is sigmoid colonic wall thickening in the area of diverticula, likely moral hypertrophy, mild pericolonic edema in the distal sigmoid. Distal to the rectum is edema in the right greater than left gluteal soft tissues without well-defined fluid collection. Appendix not visualized, surgically absent per history. Scattered fluid-filled small bowel is nondilated and noninflamed. Vascular/Lymphatic: Aorto bi-iliac atherosclerosis. Aortic tortuosity without aneurysm. Limited assessment for adenopathy given lack contrast and paucity of intra-abdominal fat. No bulky adenopathy. Reproductive: Post hysterectomy. Unremarkable appearance of the vaginal cuff. No rectal contrast in the vaginal cuff, however patient did not tolerate rectal contrast administration. Other: Small amount of free fluid in the left pelvis. No free air. No upper abdominal ascites. Musculoskeletal: Scoliotic curvature in degenerative change in the spine. There are no acute or suspicious osseous abnormalities. IMPRESSION: 1. Findings suspicious for acute distal sigmoid diverticulitis. No perforation or abscess. 2. Small amount of free fluid in the left pelvis, likely reactive. 3. Unremarkable appearance of the vaginal cuff. 4. Perirectal edema tracking  distally in the gluteal crease without perirectal abscess or soft tissue air. 5.  Aortic Atherosclerosis (ICD10-I70.0). Electronically Signed   By: Keith Rake M.D.   On: 09/16/2018 05:39   Dg Chest 2 View  Result Date: 09/16/2018 CLINICAL DATA:  Fever EXAM: CHEST - 2 VIEW COMPARISON:  08/14/2018 FINDINGS: Patient is rotated. Possible right infrahilar opacity, adjacent to the right heart border (overlying the spine) on the frontal radiograph, possibly posterior on the lateral view. This  was also present on the prior. Left lung is clear. The heart is normal in size. IMPRESSION: Possible right infrahilar opacity, raising concern for right lower lobe pneumonia, although also present on the prior study. Consider CT chest with contrast for further evaluation. Electronically Signed   By: Julian Hy M.D.   On: 09/16/2018 01:29   Ct Head Wo Contrast  Result Date: 09/16/2018 CLINICAL DATA:  84 y/o F; fever of unknown origin. Altered mental status. History of hypertension and dementia. EXAM: CT HEAD WITHOUT CONTRAST TECHNIQUE: Contiguous axial images were obtained from the base of the skull through the vertex without intravenous contrast. COMPARISON:  08/15/2018 CT head FINDINGS: Brain: No evidence of acute infarction, hemorrhage, hydrocephalus, extra-axial collection or mass lesion/mass effect. Stable chronic microvascular ischemic changes and volume loss of the brain. Vascular: Calcific atherosclerosis of carotid siphons. No hyperdense vessel identified. Skull: Normal. Negative for fracture or focal lesion. Sinuses/Orbits: No acute finding. Other: None. IMPRESSION: 1. No acute intracranial abnormality identified. 2. Stable chronic microvascular ischemic changes and volume loss of the brain. Electronically Signed   By: Kristine Garbe M.D.   On: 09/16/2018 04:39    EKG: Independently reviewed.  Sinus tachycardia with rate 107; nonspecific ST changes with no evidence of acute ischemia   Labs  on Admission: I have personally reviewed the available labs and imaging studies at the time of the admission.  Pertinent labs:   CO2 20 Glucose 138 BUN 41/Creatinine 2.06/GFR 20; prior 33/1.67/26 on 9/9 WBC 12.0 Hgb 8.4, prior 9.6 on 9/12 INR 1.20 Lactate 0.98 C diff negative  Assessment/Plan Principal Problem:   Sepsis secondary to UTI [facility]) Active Problems:   Myelodysplastic syndrome (HCC)   Hypothyroidism   Essential hypertension   Vascular dementia without behavioral disturbance (HCC)   CKD (chronic kidney disease) stage 3, GFR 30-59 ml/min (HCC)   AKI (acute kidney injury) ([place])   Aspiration pneumonia of right lower lobe (HCC)   Diverticulitis   Sepsis -SIRS criteria in this patient includes: Leukocytosis, fever, tachycardia, tachypnea, hypoxia  -Patient has evidence of acute organ failure with AKI and borderline hypotension -While awaiting blood cultures, this appears to be a preseptic condition. -Sepsis protocol initiated -Source could include UTI (recent E coli.enterobacter UTI with bacteremia); aspiration PNA; diverticulitis -Blood and urine cultures pending -Will admit to SDU and continue to monitor -Treat with IV Cefepime/Vanc/Flagyl for undifferentiated sepsis -Will order lower respiratory tract procalcitonin level.  Antibiotics would not be indicated for PCT <0.1 and probably should not be used for < 0.25.  >0.5 indicates infection and >>0.5 indicates more serious disease.  As the procalcitonin level normalizes, it will be reasonable to consider de-escalation of antibiotic coverage.  Aspiration PNA -Prior speech eval on 8/30 led to regular diet with thin liquids -Currently NPO due to AMS (see below) but also with ongoing concern regarding possible RLL PNA again raising concern for aspiration -CXR suggests consideration of CT, but this seems unlikely to change her plan of care at this time so will not order currently  Stool incontinence  -CT with concern for  acute diverticulitis - certainly, this is a consideration for her source of infection -She had copious amounts of stool in her vagina, leading to concern for a fistula -She has been cleaned and a rectal containment system does n ot appear to be needed at this time  AKI on CKD -Baseline GFR appears to be stage 3-4 range -Currently with worsening in the setting of sepsis -Will hydrate and follow -Hold  Cozaar  Vascular dementia -Baseline appears to be orientation to person and ability to engage -She is currently obtunded and minimally interactive, which is clearly different from her described baseline -Anticipate improvement with ongoing treatment of sepsis -SNF note from 9/30 indicates falls without injuries on 9/29 and 9/30, so apparently she is ambulatory at baseline with "unsteady gait, poor safety awareness due to cognitive impairment" -Consider goals of care discussion, as she may be approaching hospice level of care -Holding Aricept pending swallow evaluation  Hypothyroidism -Normal TSH in 8/19 -Continue Synthroid at current dose for now, once cleared by speech therapy regarding her swallow function  MDS -She is beginning to have downtrending Hgb associated with MDS and was planned to begin an erythropoietin stimulating agent as of her last oncology visit on 9/12 -Aranesp was provided 9/12 -Her Hgb appears to be stable at this time  HTN -Hold Cozaar -Will order prn hydralazine for now    DVT prophylaxis: Lovenox  Code Status: DNR - paperwork present at bedside Family Communication: None present  Disposition Plan:  Back to SNF once clinically improved Consults called: ST Admission status: Admit - It is my clinical opinion that admission to [facility] is reasonable and necessary because of the expectation that this patient will require hospital care that crosses at least 2 midnights to treat this condition based on the medical complexity of the problems presented.  Given the  aforementioned information, the predictability of an adverse outcome is felt to be significant.     Karmen Bongo MD [facility]  If note is complete, please contact covering daytime or nighttime physician. [email or web address] Password [email or web address]  09/16/2018, 10:16 AM

## 2018-09-16 NOTE — ED Provider Notes (Addendum)
[facility] EMERGENCY DEPARTMENT Provider Note  CSN: [id number] Arrival date & time: 09/16/18 0040  Chief Complaint(s) Code Sepsis  Triage Note 0040 Pt comes from [place] via [facility], was there with aspiration pneumonia, this weekend went with family by care to [facility]. Today staff reported new AMS, fever 103, unknown LSN. Pt had blood work two days ago and WBC of 17. Neuro intact bilaterally. Hx of dementia. Pt is a DNR     HPI Brittany [name] is a 84 y.o. female who presents from skilled nursing facility for fever and altered mental status.  Remainder of history, ROS, and physical exam limited due to patient's condition (AMS). Additional information was obtained from EMS.   Level V Caveat.  Per EMS, skilled nursing facility reported patient's normal baseline status is conversational and typically more oriented. Now more lethargic.  While reviewing skilled nursing facility paperwork and prior admissions it was noted that the patient was being treated for aspiration pneumonia and urinary tract infection with doxycycline and Bactrim from September 3 to September 8.  HPI  Past Medical History Past Medical History:  Diagnosis Date  . Blood transfusion 2006  . Colon polyps   . CVA (cerebral infarction) 2013  . Dementia ([place])   . Diverticulosis of colon   . DJD (degenerative joint disease)   . Hyperlipidemia   . Hypertension   . Hypothyroidism   . Melanoma ([place]) 03/20/15   removed with Moh's surgery  . Myelodysplasia 02/04/2012  . Normochromic normocytic anemia 02/04/2012  . Onychomycosis   . Osteoporosis   . Rectal prolapse   . Shoulder pain   . Unstable gait    Patient Active Problem List   Diagnosis Date Noted  . Aspiration pneumonia of right lower lobe ([place]) 08/19/2018  . AKI (acute kidney injury) ([place])   . Leukocytosis 08/15/2018  . Sepsis ([place]) 08/15/2018  . UTI (urinary tract infection) 08/14/2018  . Fatigue 07/28/2018  . Weight loss  07/28/2018  . Actinic keratosis 06/26/2018  . Diastolic CHF ([place]) 03/00/9233  . History of CVA (cerebrovascular accident) 08/08/2017  . CKD (chronic kidney disease) stage 3, GFR 30-59 ml/min ([facility]) 08/08/2017  . Anemia, iron deficiency 01/11/2016  . Unstable gait   . Shoulder pain   . History of melanoma 03/20/2015  . Constipation 07/18/2014  . Vascular dementia without behavioral disturbance ([place]) 11/11/2012  . Stroke ([place]) 12/17/2011  . Back pain 04/23/2011  . COLONIC POLYPS, HX OF 06/05/2009  . Myelodysplastic syndrome ([place]) 01/02/2009  . Hypothyroidism 08/21/2007  . Hyperlipidemia 08/21/2007  . Essential hypertension 08/21/2007  . DIVERTICULOSIS, COLON 08/21/2007  . OSTEOPOROSIS 08/21/2007  . SKIN CANCER, HX OF 08/21/2007   Home Medication(s) Prior to Admission medications   Medication Sig Start Date End Date Taking? Authorizing Provider  acetaminophen (TYLENOL) 500 MG tablet Take 500 mg by mouth every 8 (eight) hours as needed for mild pain.  05/31/13   Marletta Lor, MD  aspirin EC 81 MG tablet Take 81 mg by mouth daily.    [provider]  Calcium Carbonate-Vit D-Min (CALTRATE 600+D PLUS) 600-400 MG-UNIT per tablet Take 1 tablet by mouth daily.      [provider]  Cholecalciferol (VITAMIN D-3 PO) Take 1,000 mg by mouth daily. 1,000mg  one daily    [provider]  cycloSPORINE (RESTASIS) 0.05 % ophthalmic emulsion Place 1 drop into both eyes 2 (two) times daily.      [provider]  donepezil (ARICEPT) 10 MG  tablet Take 10 mg by mouth daily.    [provider]  hydroxypropyl methylcellulose / hypromellose (ISOPTO TEARS / GONIOVISC) 2.5 % ophthalmic solution Place 1 drop into both eyes every 2 (two) hours as needed for dry eyes.    [provider]  levothyroxine (SYNTHROID, LEVOTHROID) 88 MCG tablet TAKE 1 TABLET BY MOUTH ONCE DAILY 12/12/14   Marletta Lor, MD  losartan (COZAAR) 50 MG tablet Take 1 tablet (50  mg total) by mouth daily. 08/19/18   Lavina Hamman, MD  Multiple Vitamin (MULTIVITAMIN WITH MINERALS) TABS Take 1 tablet by mouth daily.    [provider]  Nutritional Supplements (NUTRITIONAL SHAKE PO) Take 120 mLs by mouth 2 (two) times daily.    [provider]  polyethylene glycol powder (GLYCOLAX/MIRALAX) powder Take 17 g by mouth daily as needed for mild constipation.     [provider]  Sodium Fluoride (PREVIDENT 5000 BOOSTER PLUS) 1.1 % PSTE Place onto teeth.    [provider]                                                                                                                                    Past Surgical History Past Surgical History:  Procedure Laterality Date  . APPENDECTOMY    . CARPAL TUNNEL RELEASE Bilateral 2006  . FLEXIBLE SIGMOIDOSCOPY  07/09/2012   Procedure: FLEXIBLE SIGMOIDOSCOPY;  Surgeon: Inda Castle, MD;  Location: [facility] ENDOSCOPY;  Service: Endoscopy;  Laterality: N/A;  . MELANOMA EXCISION Right 2005   arm  . POLYPECTOMY  2004   Laparoscopic  . RECTOCELE REPAIR    . REPLACEMENT TOTAL KNEE  2005 & 2006  . VAGINAL HYSTERECTOMY  1989   Family History Family History  Problem Relation Age of Onset  . Prostate cancer Brother   . Cancer Brother        renal cell carcinoma  . Heart disease Brother   . Breast cancer Mother 64  . Cancer Father        unknown kind  . Arthritis Daughter     Social History Social History   Tobacco Use  . Smoking status: Never Smoker  . Smokeless tobacco: Never Used  Substance Use Topics  . Alcohol use: No  . Drug use: No   Allergies Lisinopril and Penicillins  Review of Systems Review of Systems  Unable to perform ROS: Mental status change  Endocrine: Positive for heat intolerance.    Physical Exam Vital Signs  I have reviewed the triage vital signs BP (!) 125/46   Pulse (!) 108   Temp (!) 100.4 F (38 C) (Rectal)   Resp (!) 31   SpO2 92%   Physical Exam    Constitutional: She appears well-developed and well-nourished. No distress.  HENT:  Head: Normocephalic and atraumatic.  Nose: Nose normal.  Eyes: Pupils are equal, round, and reactive to light. Conjunctivae and EOM are normal.  Right eye exhibits no discharge. Left eye exhibits no discharge. No scleral icterus.  Neck: Normal range of motion. Neck supple.  Cardiovascular: Normal rate and regular rhythm. Exam reveals no gallop and no friction rub.  No murmur heard. Pulmonary/Chest: Effort normal and breath sounds normal. No stridor. No respiratory distress. She has no rales.  Abdominal: Soft. She exhibits no distension. There is no tenderness.  Musculoskeletal: She exhibits no edema or tenderness.  Neurological: She is disoriented (oriented to self only).  Sleepy, but easily arousable  Skin: Skin is warm and dry. No rash noted. She is not diaphoretic. No erythema.  Psychiatric: She has a normal mood and affect.  Vitals reviewed.   ED Results and Treatments Labs (all labs ordered are listed, but only abnormal results are displayed) Labs Reviewed  COMPREHENSIVE METABOLIC PANEL - Abnormal; Notable for the following components:      Result Value   CO2 20 (*)    Glucose, Bld 138 (*)    BUN 41 (*)    Creatinine, Ser 2.06 (*)    Calcium 8.5 (*)    Total Protein 5.8 (*)    Albumin 2.9 (*)    AST 57 (*)    ALT 77 (*)    GFR calc non Af Amer 20 (*)    GFR calc Af Amer 23 (*)    All other components within normal limits  CBC WITH DIFFERENTIAL/PLATELET - Abnormal; Notable for the following components:   WBC 12.0 (*)    RBC 3.04 (*)    Hemoglobin 8.4 (*)    HCT 27.3 (*)    RDW 17.9 (*)    Neutro Abs 9.0 (*)    Monocytes Absolute 1.6 (*)    All other components within normal limits  C DIFFICILE QUICK SCREEN W PCR REFLEX  CULTURE, BLOOD (ROUTINE X 2)  CULTURE, BLOOD (ROUTINE X 2)  PROTIME-INR  URINALYSIS, ROUTINE W REFLEX MICROSCOPIC  I-STAT CG4 LACTIC ACID, ED  I-STAT CG4  LACTIC ACID, ED                                                                                                                         EKG  EKG Interpretation  Date/Time:  Wednesday September 16 2018 03:37:07 EDT Ventricular Rate:  107 PR Interval:    QRS Duration: 90 QT Interval:  329 QTC Calculation: 439 R Axis:   28 Text Interpretation:  Sinus tachycardia Probable left atrial enlargement lead reversal resolved. Otherwise no significant change from previous tracings Confirmed by Addison Lank [phone]) on 09/16/2018 6:07:15 AM      Radiology Ct Abdomen Pelvis Wo Contrast  Result Date: 09/16/2018 CLINICAL DATA:  Fever of unknown origin. Assess for enterovaginal fistula. History of rectal prolapse post rectocele repair. EXAM: CT ABDOMEN AND PELVIS WITHOUT CONTRAST TECHNIQUE: Multidetector CT imaging of the abdomen and pelvis was performed following the standard protocol without IV contrast. Attempted administration of rectal contrast, however patient did not tolerate. COMPARISON:  CT 07/08/2012.  Renal ultrasound 08/16/2018 FINDINGS: Lower chest: Motion artifact with minor atelectasis. Aortic atherosclerosis. Heart size normal. Small hiatal hernia. Hepatobiliary: Mild motion artifact through the liver and gallbladder. No focal hepatic abnormality. No calcified gallstone or pericholecystic inflammation. No biliary dilatation. Pancreas: Parenchymal atrophy. No ductal dilatation or inflammation. Spleen: Normal in size without focal abnormality. Adrenals/Urinary Tract: Mild adrenal thickening without dominant mass. No hydronephrosis. Mild right perinephric edema. Probable peripheral calcified cyst in the upper right kidney measuring 18 mm. Cyst in the lower left kidney has faint peripheral calcifications. Additional cortical cysts in both kidneys suspected. Urinary bladder is physiologically distended without wall thickening. Stomach/Bowel: Motion artifact and lack of oral contrast limits detailed bowel  assessment. Minimal enteric contrast within the rectum and distal sigmoid colon. No rectal contrast in the vagina. Distal colonic diverticulosis, advanced in the sigmoid colon. There is sigmoid colonic wall thickening in the area of diverticula, likely moral hypertrophy, mild pericolonic edema in the distal sigmoid. Distal to the rectum is edema in the right greater than left gluteal soft tissues without well-defined fluid collection. Appendix not visualized, surgically absent per history. Scattered fluid-filled small bowel is nondilated and noninflamed. Vascular/Lymphatic: Aorto bi-iliac atherosclerosis. Aortic tortuosity without aneurysm. Limited assessment for adenopathy given lack contrast and paucity of intra-abdominal fat. No bulky adenopathy. Reproductive: Post hysterectomy. Unremarkable appearance of the vaginal cuff. No rectal contrast in the vaginal cuff, however patient did not tolerate rectal contrast administration. Other: Small amount of free fluid in the left pelvis. No free air. No upper abdominal ascites. Musculoskeletal: Scoliotic curvature in degenerative change in the spine. There are no acute or suspicious osseous abnormalities. IMPRESSION: 1. Findings suspicious for acute distal sigmoid diverticulitis. No perforation or abscess. 2. Small amount of free fluid in the left pelvis, likely reactive. 3. Unremarkable appearance of the vaginal cuff. 4. Perirectal edema tracking distally in the gluteal crease without perirectal abscess or soft tissue air. 5.  Aortic Atherosclerosis (ICD10-I70.0). Electronically Signed   By: Keith Rake M.D.   On: 09/16/2018 05:39   Dg Chest 2 View  Result Date: 09/16/2018 CLINICAL DATA:  Fever EXAM: CHEST - 2 VIEW COMPARISON:  08/14/2018 FINDINGS: Patient is rotated. Possible right infrahilar opacity, adjacent to the right heart border (overlying the spine) on the frontal radiograph, possibly posterior on the lateral view. This was also present on the prior.  Left lung is clear. The heart is normal in size. IMPRESSION: Possible right infrahilar opacity, raising concern for right lower lobe pneumonia, although also present on the prior study. Consider CT chest with contrast for further evaluation. Electronically Signed   By: Julian Hy M.D.   On: 09/16/2018 01:29   Ct Head Wo Contrast  Result Date: 09/16/2018 CLINICAL DATA:  84 y/o F; fever of unknown origin. Altered mental status. History of hypertension and dementia. EXAM: CT HEAD WITHOUT CONTRAST TECHNIQUE: Contiguous axial images were obtained from the base of the skull through the vertex without intravenous contrast. COMPARISON:  08/15/2018 CT head FINDINGS: Brain: No evidence of acute infarction, hemorrhage, hydrocephalus, extra-axial collection or mass lesion/mass effect. Stable chronic microvascular ischemic changes and volume loss of the brain. Vascular: Calcific atherosclerosis of carotid siphons. No hyperdense vessel identified. Skull: Normal. Negative for fracture or focal lesion. Sinuses/Orbits: No acute finding. Other: None. IMPRESSION: 1. No acute intracranial abnormality identified. 2. Stable chronic microvascular ischemic changes and volume loss of the brain. Electronically Signed   By: Kristine Garbe M.D.   On: 09/16/2018 04:39   Pertinent labs & imaging  results that were available during my care of the patient were reviewed by me and considered in my medical decision making (see chart for details).  Medications Ordered in ED Medications  acetaminophen (TYLENOL) suppository 650 mg (0 mg Rectal Hold 09/16/18 0650)  metroNIDAZOLE (FLAGYL) IVPB 500 mg (500 mg Intravenous New Bag/Given 09/16/18 0650)  cefTRIAXone (ROCEPHIN) 1 g in sodium chloride 0.9 % 100 mL IVPB (0 g Intravenous Stopped 09/16/18 0302)  0.9 %  sodium chloride infusion ( Intravenous New Bag/Given 09/16/18 0300)                                                                                                                                     Procedures Procedures CRITICAL CARE Performed by: Grayce Sessions Maralyn Witherell Total critical care time: 65 minutes Critical care time was exclusive of separately billable procedures and treating other patients. Critical care was necessary to treat or prevent imminent or life-threatening deterioration. Critical care was time spent personally by me on the following activities: development of treatment plan with patient and/or surrogate as well as nursing, discussions with consultants, evaluation of patient's response to treatment, examination of patient, obtaining history from patient or surrogate, ordering and performing treatments and interventions, ordering and review of laboratory studies, ordering and review of radiographic studies, pulse oximetry and re-evaluation of patient's condition.   (including critical care time)  Medical Decision Making / ED Course I have reviewed the nursing notes for this encounter and the patient's prior records (if available in EHR or on provided paperwork).     Due to AMS and fever, Code sepsis initiated. Started on empiric Rocephin. Lactic WNL and BP reassuring, does not meet septic shock for 30cc/kg of IVF. Will provide with IVF infusion.  CXR with persistent RLL opacity.  During in and out cath, RN noted fecal matter coming from vaginal vault. I also visualized.  Pelvic exam with fecal matter in the vaginal vault but no obvious fistula noted.  Will obtain CT A/P with rectal contrast to assess for enterovaginal fistula. C.diff ordered and negative.  CT suspicious for diverticulitis. No obvious fistula, but not an optimal contrast scan as there was difficulty with rectal contrast. Added flagyl.  UA pending. Unable to perform in and out due to fecal matter.     Final Clinical Impression(s) / ED Diagnoses Final diagnoses:  Sepsis with acute renal failure without septic shock, due to unspecified organism, unspecified acute renal  failure type [facility])  Diverticulitis  Disorientation      This chart was dictated using voice recognition software.  Despite best efforts to proofread,  errors can occur which can change the documentation meaning.     Fatima Blank, MD 09/16/18 [phone]

## 2018-09-17 DIAGNOSIS — R6520 Severe sepsis without septic shock: Secondary | ICD-10-CM

## 2018-09-17 DIAGNOSIS — K5792 Diverticulitis of intestine, part unspecified, without perforation or abscess without bleeding: Secondary | ICD-10-CM

## 2018-09-17 DIAGNOSIS — R410 Disorientation, unspecified: Secondary | ICD-10-CM

## 2018-09-17 DIAGNOSIS — E039 Hypothyroidism, unspecified: Secondary | ICD-10-CM

## 2018-09-17 DIAGNOSIS — J690 Pneumonitis due to inhalation of food and vomit: Secondary | ICD-10-CM

## 2018-09-17 LAB — COMPREHENSIVE METABOLIC PANEL
ALBUMIN: 2.3 g/dL — AB (ref 3.5–5.0)
ALT: 61 U/L — ABNORMAL HIGH (ref 0–44)
AST: 43 U/L — AB (ref 15–41)
Alkaline Phosphatase: 97 U/L (ref 38–126)
Anion gap: 5 (ref 5–15)
BUN: 25 mg/dL — AB (ref 8–23)
CO2: 22 mmol/L (ref 22–32)
Calcium: 8.1 mg/dL — ABNORMAL LOW (ref 8.9–10.3)
Chloride: 114 mmol/L — ABNORMAL HIGH (ref 98–111)
Creatinine, Ser: 1.29 mg/dL — ABNORMAL HIGH (ref 0.44–1.00)
GFR calc Af Amer: 40 mL/min — ABNORMAL LOW (ref >60)
GFR calc non Af Amer: 35 mL/min — ABNORMAL LOW (ref >60)
GLUCOSE: 114 mg/dL — AB (ref 70–99)
POTASSIUM: 3.9 mmol/L (ref 3.5–5.1)
SODIUM: 141 mmol/L (ref 135–145)
TOTAL PROTEIN: 5.2 g/dL — AB (ref 6.5–8.1)
Total Bilirubin: 1 mg/dL (ref 0.3–1.2)

## 2018-09-17 MED ORDER — VANCOMYCIN HCL IN DEXTROSE 750-5 MG/150ML-% IV SOLN
750.0000 mg | INTRAVENOUS | Status: DC
  Administered 2018-09-17 – 2018-09-18 (×2): 750 mg via INTRAVENOUS
  Filled 2018-09-17 (×2): qty 150

## 2018-09-17 MED ORDER — ASPIRIN EC 81 MG PO TBEC
81.0000 mg | DELAYED_RELEASE_TABLET | Freq: Every day | ORAL | Status: DC
  Administered 2018-09-17 – 2018-09-18 (×2): 81 mg via ORAL
  Filled 2018-09-17 (×2): qty 1

## 2018-09-17 MED ORDER — DONEPEZIL HCL 10 MG PO TABS
10.0000 mg | ORAL_TABLET | Freq: Every day | ORAL | Status: DC
  Administered 2018-09-17 – 2018-09-24 (×8): 10 mg via ORAL
  Filled 2018-09-17 (×8): qty 1

## 2018-09-17 MED ORDER — GUAIFENESIN 100 MG/5ML PO SOLN
5.0000 mL | ORAL | Status: DC | PRN
  Administered 2018-09-17: 100 mg via ORAL
  Filled 2018-09-17: qty 5

## 2018-09-17 MED ORDER — LEVOTHYROXINE SODIUM 88 MCG PO TABS
88.0000 ug | ORAL_TABLET | Freq: Every day | ORAL | Status: DC
  Administered 2018-09-18: 88 ug via ORAL
  Filled 2018-09-17: qty 1

## 2018-09-17 MED ORDER — ALBUTEROL SULFATE (2.5 MG/3ML) 0.083% IN NEBU
2.5000 mg | INHALATION_SOLUTION | Freq: Four times a day (QID) | RESPIRATORY_TRACT | Status: DC | PRN
  Administered 2018-09-17 – 2018-09-18 (×5): 2.5 mg via RESPIRATORY_TRACT
  Filled 2018-09-17 (×5): qty 3

## 2018-09-17 NOTE — Progress Notes (Signed)
Pharmacy Antibiotic Note  Brittany [name] is a 84 y.o. female on day # 2 Vancomycin, Cefepime and Flagyl for sepsis and possible diverticulitis coverage.  Pharmacy has been consulted for Vancomycin and Cefepime dosing.    Creatinine improved after hydration. Tmax 102.2, WBC 12.0 on 10/2   Vanc loading dose given on 10/2 and had planned q48hrs dosing.     Hx E coli sepsid due to UTI and aspiration pneumonia. Cultures 08/14/18 grew E coli. Enterobacteriaceae species on BCID 08/14/18, but detected E coli only and not Enterobacter.  Treated with Aztreonam then Doxycycline and Septra DS for total of 7 days.  Plan:  Change Vancomycin to 750 mg IV q24hrs.  Continue Cefepime 1 gm IV q24hrs  Also on Flagyl 500 mg IV q8hrs  Follow renal function, culture data, clinical progress.  Target Vanc troughs 15-20 mcg/ml  Will plan to check Vanc trough level at steady state.  Weight: 153 lb 7 oz (69.6 kg)  Temp (24hrs), Avg:100 F (37.8 C), Min:98.2 F (36.8 C), Max:102.2 F (39 C)  Recent Labs  Lab 09/16/18 0050 09/16/18 0128 09/17/18 0445  WBC 12.0*  --   --   CREATININE 2.06*  --  1.29*  LATICACIDVEN  --  0.98  --     Estimated Creatinine Clearance: 23.6 mL/min (A) (by C-G formula based on SCr of 1.29 mg/dL (H)).    Allergies  Allergen Reactions  . Lisinopril     REACTION: angioedema  . Penicillins     REACTION: rash    Antimicrobials this admission:  Ceftriaxone 10/2 x 1  Cefepime 10/2>>  Vancomycin 10/2>>  Metronidazole 10/2>>  Dose adjustments this admission:  10/3:  Change empiric Vanc 1gm q48h to 750 mg IV q24h for improved renal funcion  Microbiology results: 10/2 C diff negative 10/2 blood x 2 - no growth x 1 day to date   - hx E coli in 1 of 2 blood cultures and urine cultures 08/14/18 - resistant only to Cipro  Thank you for allowing pharmacy to be a part of this patient's care.  Arty Baumgartner, [place] Pager: [phone] or phone: [phone] 09/17/2018 11:54 AM

## 2018-09-17 NOTE — Progress Notes (Signed)
  Speech Language Pathology Treatment: Dysphagia  Patient Details Name: Brittany [name] MRN: [id number] DOB: [birth year] Today's Date: 09/17/2018 Time: 0034-9179 SLP Time Calculation (min) (ACUTE ONLY): 16 min  Assessment / Plan / Recommendation Clinical Impression  Pt presented with s/s consistent with mild oral and moderate pharyngeal dysphagia including anterior spillage, immediate (X2) and delayed (X1) throat clear following thin PO. Prolonged mastication also noted with regular. She was somewhat lethargic throughout session and required mod verbal cues to participate and use compensatory strategies (maintain upright position, slow rate, small bites/sips). Pt unable to answer many questions regarding history due to baseline dementia dx. [name] family reported she intermittently coughed/cleared her throat during breakfast this morning, and they acknowledged risk of aspiration and its correlation to pneumonia; they stated wish for pt to continue current diet without pursuing instrumental swallow testing. Daughter-in-law stated perception of pt's current presentation may be due to "atelectasis and decreased movement" (she is a retired [employer] features editor). Although they demonstrated thorough education regarding pt's compensatory strategies, SLP provided further educated both family and pt regarding proper positioning, slow rate, small bites and sips to facilitate safest swallow on current diet in accordance with pt/family wishes. ST will continue to follow to reinforce pt's use of safe swallow strategies in acute setting.    HPI HPI: Pt is a 84 y.o. female with medical history significant of dementia (?baseline oriented to self); myelodysplasia; hypothyroidism; HTN; HLD; stage 3 CKD; and h/o CVA (2013) presenting with fever and AMS. CXR concerning for RLL PNA. CT abdomen suspiscious for acute distal sigmoid diverticulitis. BSE 08/15/18 recommended NPO due to mentation, but on next date SLP reassessed and  recommended regular diet and thin liquids as swallow appeared functional.       SLP Plan  Continue with current plan of care       Recommendations  Diet recommendations: Thin liquid Liquids provided via: Cup;Straw Medication Administration: Whole meds with puree Supervision: Staff to assist with self feeding;Full supervision/cueing for compensatory strategies Compensations: Slow rate;Small sips/bites;Minimize environmental distractions Postural Changes and/or Swallow Maneuvers: Seated upright 90 degrees                Oral Care Recommendations: Oral care BID Follow up Recommendations: Skilled Nursing facility SLP Visit Diagnosis: Dysphagia, oropharyngeal phase (R13.12) Plan: Continue with current plan of care       Brittany [name], Student SLP                 Brittany [name] 09/17/2018, 12:07 PM

## 2018-09-17 NOTE — Progress Notes (Signed)
PROGRESS NOTE    Brittany [name]  OEU:[id number] DOB: July 06, 1924 DOA: 09/16/2018 PCP: Blanchie Serve, MD   Brief Narrative:   Assessment & Plan:   Principal Problem:   Sepsis secondary to UTI [facility]) Active Problems:   Myelodysplastic syndrome ([place])   Hypothyroidism   Essential hypertension   Vascular dementia without behavioral disturbance ([place])   CKD (chronic kidney disease) stage 3, GFR 30-59 ml/min (HCC)   AKI (acute kidney injury) ([place])   Aspiration pneumonia of right lower lobe ([place])   Diverticulitis  Sepsis likely secondary to UTI recent history of E. coli Enterobacter UTI with bacteremia, diverticulitis and/or aspiration pneumonia.  Will follow cultures.  Patient and Flagyl.  Received sepsis protocol.  Blood cultures negative in 1 day.  C. difficile was negative.  WBC 12.0.  Lactate of 0.9.  Calcitonin of 1.6.  T-max of 102.2 F.  Awaiting for urine analysis and culture.  Diarrhea.  CT scan of the abdomen pelvis showing possible acute sigmoid diverticulitis.  Patient is on antibiotics.  We will continue with that.  C. difficile assay was negative.  Acute kidney injury chronic kidney disease stage III likely secondary to hypotension volume depletion and sepsis.  This has improved.  Will continue on gentle IV fluid hydration.  We will closely monitor BMP.  Vascular dementia.  Continue supportive care.  Baseline patient is oriented to person and place.  Aricept on hold.  History of aspiration pneumonia, dysphagia.  Speech and swallow evaluation recommended dysphagia 3 diet and patient is a mild aspiration risk.  Chest x-ray shows right lower lobe infiltrate.  Hypothyroidism.  Resume Synthroid  Myelodysplastic syndrome.  Hemoglobin of 8.4.  Recently received Aranesp on 912  Hypertension.  On PRN hydralazine.  Cozaar on hold.   DVT prophylaxis: Lovenox  Code Status: DNR  Family Communication: Prolonged discussion with the patient's son and family  at bedside  Disposition  Plan: Skilled nursing facility  Consultants: None  Procedures: None  Antimicrobials: Vancomycin, cefepime, Flagyl  Subjective:  Patient denies any pain has mild cough.  Feels weak.  Had spiked a fever.  Denies urinary urgency, frequency or dysuria.  Had some loose stools yesterday.  Denies nausea vomiting or chest pain.  Objective: Vitals:   09/16/18 2100 09/17/18 0138 09/17/18 0407 09/17/18 0822  BP:  115/67 (!) 109/42 (!) 105/39  Pulse:  (!) 106 95 96  Resp:  (!) 27 (!) 23 (!) 30  Temp: 98.9 F (37.2 C) 98.2 F (36.8 C) 98.6 F (37 C) (!) 102.2 F (39 C)  TempSrc: Oral Oral Oral Rectal  SpO2:  100% 98% 96%  Weight:   69.6 kg     Intake/Output Summary (Last 24 hours) at 09/17/2018 0900 Last data filed at 09/16/2018 1700 Gross per 24 hour  Intake 0 ml  Output -  Net 0 ml   Filed Weights   09/17/18 0407  Weight: 69.6 kg   Body mass index is 30.99 kg/m.  Examination: General exam: Appears calm and comfortable ,Not in obvious distress, chronically ill HEENT:PERRL,Oral mucosa moist, Ear/Nose normal on gross exam Respiratory system: Diminished breath sounds bilaterally.  Occasional rhonchi Cardiovascular system: S1 & S2 heard, RRR. No JVD, murmurs, rubs, gallops or clicks. No pedal edema. Gastrointestinal system: Abdomen is nondistended, soft and nontender. No organomegaly or masses felt. Normal bowel sounds heard. MSK: Normal muscle bulk,tone ,power Extremities: Trace lower extremity edema no clubbing ,no cyanosis, distal peripheral pulses palpable. Skin: No rashes, lesions or ulcers,no icterus ,no pallor Central nervous  system: Alert and oriented to person. No focal neurological deficits.  Denies weakness.  Moves all extremities  Data Reviewed: I have personally reviewed following labs and imaging studies  CBC: Recent Labs  Lab 09/16/18 0050  WBC 12.0*  NEUTROABS 9.0*  HGB 8.4*  HCT 27.3*  MCV 89.8  PLT 211   Basic Metabolic Panel: Recent Labs  Lab  09/16/18 0050 09/17/18 0445  NA 138 141  K 4.4 3.9  CL 107 114*  CO2 20* 22  GLUCOSE 138* 114*  BUN 41* 25*  CREATININE 2.06* 1.29*  CALCIUM 8.5* 8.1*   GFR: Estimated Creatinine Clearance: 23.6 mL/min (A) (by C-G formula based on SCr of 1.29 mg/dL (H)). Liver Function Tests: Recent Labs  Lab 09/16/18 0050 09/17/18 0445  AST 57* 43*  ALT 77* 61*  ALKPHOS 104 97  BILITOT 1.2 1.0  PROT 5.8* 5.2*  ALBUMIN 2.9* 2.3*   No results for input(s): LIPASE, AMYLASE in the last 168 hours. No results for input(s): AMMONIA in the last 168 hours. Coagulation Profile: Recent Labs  Lab 09/16/18 0050  INR 1.20   Cardiac Enzymes: No results for input(s): CKTOTAL, CKMB, CKMBINDEX, TROPONINI in the last 168 hours. BNP (last 3 results) No results for input(s): PROBNP in the last 8760 hours. HbA1C: No results for input(s): HGBA1C in the last 72 hours. CBG: No results for input(s): GLUCAP in the last 168 hours. Lipid Profile: No results for input(s): CHOL, HDL, LDLCALC, TRIG, CHOLHDL, LDLDIRECT in the last 72 hours. Thyroid Function Tests: No results for input(s): TSH, T4TOTAL, FREET4, T3FREE, THYROIDAB in the last 72 hours. Anemia Panel: No results for input(s): VITAMINB12, FOLATE, FERRITIN, TIBC, IRON, RETICCTPCT in the last 72 hours. Sepsis Labs: Recent Labs  Lab 09/16/18 0128 09/16/18 0912  PROCALCITON  --  1.62  LATICACIDVEN 0.98  --     Recent Results (from the past 240 hour(s))  Culture, blood (Routine x 2)     Status: None (Preliminary result)   Collection Time: 09/16/18 12:55 AM  Result Value Ref Range Status   Specimen Description BLOOD BLOOD LEFT FOREARM  Final   Special Requests   Final    BOTTLES DRAWN AEROBIC AND ANAEROBIC Blood Culture adequate volume   Culture   Final    NO GROWTH 1 DAY Performed at [facility], [place] [address], [place], [name] [address]    Report Status PENDING  Incomplete  Culture, blood (Routine x 2)     Status: None (Preliminary  result)   Collection Time: 09/16/18 12:57 AM  Result Value Ref Range Status   Specimen Description BLOOD LEFT WRIST  Final   Special Requests   Final    BOTTLES DRAWN AEROBIC AND ANAEROBIC Blood Culture results may not be optimal due to an inadequate volume of blood received in culture bottles   Culture   Final    NO GROWTH 1 DAY Performed at [facility], [place] [address] [place], [place] [address]    Report Status PENDING  Incomplete  C difficile quick scan w PCR reflex     Status: None   Collection Time: 09/16/18  2:32 AM  Result Value Ref Range Status   C Diff antigen NEGATIVE NEGATIVE Final   C Diff toxin NEGATIVE NEGATIVE Final   C Diff interpretation No C. difficile detected.  Final    Comment: Performed at [facility], [place] [address], [place], [place] [address]     Radiology Studies: Ct Abdomen Pelvis  Wo Contrast  Result Date: 09/16/2018 CLINICAL DATA:  Fever of unknown origin. Assess for enterovaginal fistula. History of rectal prolapse post rectocele repair. EXAM: CT ABDOMEN AND PELVIS WITHOUT CONTRAST TECHNIQUE: Multidetector CT imaging of the abdomen and pelvis was performed following the standard protocol without IV contrast. Attempted administration of rectal contrast, however patient did not tolerate. COMPARISON:  CT 07/08/2012.  Renal ultrasound 08/16/2018 FINDINGS: Lower chest: Motion artifact with minor atelectasis. Aortic atherosclerosis. Heart size normal. Small hiatal hernia. Hepatobiliary: Mild motion artifact through the liver and gallbladder. No focal hepatic abnormality. No calcified gallstone or pericholecystic inflammation. No biliary dilatation. Pancreas: Parenchymal atrophy. No ductal dilatation or inflammation. Spleen: Normal in size without focal abnormality. Adrenals/Urinary Tract: Mild adrenal thickening without dominant mass. No hydronephrosis. Mild right perinephric edema. Probable peripheral calcified cyst in the upper right kidney measuring 18  mm. Cyst in the lower left kidney has faint peripheral calcifications. Additional cortical cysts in both kidneys suspected. Urinary bladder is physiologically distended without wall thickening. Stomach/Bowel: Motion artifact and lack of oral contrast limits detailed bowel assessment. Minimal enteric contrast within the rectum and distal sigmoid colon. No rectal contrast in the vagina. Distal colonic diverticulosis, advanced in the sigmoid colon. There is sigmoid colonic wall thickening in the area of diverticula, likely moral hypertrophy, mild pericolonic edema in the distal sigmoid. Distal to the rectum is edema in the right greater than left gluteal soft tissues without well-defined fluid collection. Appendix not visualized, surgically absent per history. Scattered fluid-filled small bowel is nondilated and noninflamed. Vascular/Lymphatic: Aorto bi-iliac atherosclerosis. Aortic tortuosity without aneurysm. Limited assessment for adenopathy given lack contrast and paucity of intra-abdominal fat. No bulky adenopathy. Reproductive: Post hysterectomy. Unremarkable appearance of the vaginal cuff. No rectal contrast in the vaginal cuff, however patient did not tolerate rectal contrast administration. Other: Small amount of free fluid in the left pelvis. No free air. No upper abdominal ascites. Musculoskeletal: Scoliotic curvature in degenerative change in the spine. There are no acute or suspicious osseous abnormalities. IMPRESSION: 1. Findings suspicious for acute distal sigmoid diverticulitis. No perforation or abscess. 2. Small amount of free fluid in the left pelvis, likely reactive. 3. Unremarkable appearance of the vaginal cuff. 4. Perirectal edema tracking distally in the gluteal crease without perirectal abscess or soft tissue air. 5.  Aortic Atherosclerosis (ICD10-I70.0). Electronically Signed   By: Keith Rake M.D.   On: 09/16/2018 05:39   Dg Chest 2 View  Result Date: 09/16/2018 CLINICAL DATA:  Fever  EXAM: CHEST - 2 VIEW COMPARISON:  08/14/2018 FINDINGS: Patient is rotated. Possible right infrahilar opacity, adjacent to the right heart border (overlying the spine) on the frontal radiograph, possibly posterior on the lateral view. This was also present on the prior. Left lung is clear. The heart is normal in size. IMPRESSION: Possible right infrahilar opacity, raising concern for right lower lobe pneumonia, although also present on the prior study. Consider CT chest with contrast for further evaluation. Electronically Signed   By: Julian Hy M.D.   On: 09/16/2018 01:29   Ct Head Wo Contrast  Result Date: 09/16/2018 CLINICAL DATA:  84 y/o F; fever of unknown origin. Altered mental status. History of hypertension and dementia. EXAM: CT HEAD WITHOUT CONTRAST TECHNIQUE: Contiguous axial images were obtained from the base of the skull through the vertex without intravenous contrast. COMPARISON:  08/15/2018 CT head FINDINGS: Brain: No evidence of acute infarction, hemorrhage, hydrocephalus, extra-axial collection or mass lesion/mass effect. Stable chronic microvascular ischemic changes and volume loss of the  brain. Vascular: Calcific atherosclerosis of carotid siphons. No hyperdense vessel identified. Skull: Normal. Negative for fracture or focal lesion. Sinuses/Orbits: No acute finding. Other: None. IMPRESSION: 1. No acute intracranial abnormality identified. 2. Stable chronic microvascular ischemic changes and volume loss of the brain. Electronically Signed   By: Kristine Garbe M.D.   On: 09/16/2018 04:39    Scheduled Meds: . acetaminophen  650 mg Rectal Once  . cycloSPORINE  1 drop Both Eyes BID  . enoxaparin (LOVENOX) injection  30 mg Subcutaneous Q24H  . sodium chloride flush  3 mL Intravenous Q12H   Continuous Infusions: . ceFEPime (MAXIPIME) IV    . ceFEPime (MAXIPIME) IV    . lactated ringers 100 mL/hr at 09/17/18 0406  . metronidazole 500 mg (09/17/18 0853)  . [START ON  09/18/2018] vancomycin       LOS: 1 day   Time spent: More than 50% of that time was spent in counseling and/or coordination of care.  Flora Lipps, MD [facility] Pager [phone]  If 7PM-7AM, please contact night-coverage [email or web address] Password [email or web address] 09/17/2018, 9:00 AM

## 2018-09-17 NOTE — Clinical Social Work Note (Signed)
Clinical Social Work Assessment  Patient Details  Name: Brittany [name] MRN: [id number] Date of Birth: [birth year]  Date of referral:  09/17/18               Reason for consult:  Facility Placement, Discharge Planning                Permission sought to share information with:  Facility Art therapist granted to share information::  Yes, Verbal Permission Granted  Name::     Brittany [name]  Agency::  [facility]  Relationship::  son  Contact Information:  [phone]  Housing/Transportation Living arrangements for the past 2 months:  [place], [place] of Information:  Adult Children Patient Interpreter Needed:  None Criminal Activity/Legal Involvement Pertinent to Current Situation/Hospitalization:  No - Comment as needed Significant Relationships:  Adult Children Lives with:  Facility Resident Do you feel safe going back to the place where you live?  Yes Need for family participation in patient care:  Yes (Comment)  Care giving concerns: Patient from [facility] SNF.    Social Worker assessment / plan: CSW met with patient's family at bedside, including two sons and daughter-in-law. Patient was asleep for most of assessment, though responded to commands from daughter-in-law. CSW introduced self and role and discussed disposition planning. Patient's family plans for patient to return to SNF at [facility] and then eventually return to ALF at [facility] after rehab. PT evaluation pending. Family had questions about continued Medicare coverage for SNF. CSW answered questions.   CSW to follow for PT recommendations and will support with disposition planning.   Employment status:  Retired Research officer, political party) PT Recommendations:  Not assessed at this time Information / Referral to community resources:  [place]  Patient/Family's Response to care: Patient's family appreciative  of care.  Patient/Family's Understanding of and Emotional Response to Diagnosis, Current Treatment, and Prognosis: Patient's family with good understanding of patient's condition and agreeable for patient to return to SNF.  Emotional Assessment Appearance:  Appears stated age Attitude/Demeanor/Rapport:  Unable to Assess Affect (typically observed):  Unable to Assess Orientation:  Oriented to Self, Oriented to Place, Oriented to  Time Alcohol / Substance use:  Not Applicable Psych involvement (Current and /or in the community):  No (Comment)  Discharge Needs  Concerns to be addressed:  Discharge Planning Concerns, Care Coordination Readmission within the last 30 days:  Yes Current discharge risk:  Physical Impairment, Cognitively Impaired Barriers to Discharge:  Continued Medical Work up   Estanislado Emms, LCSW 09/17/2018, 2:30 PM

## 2018-09-17 NOTE — Progress Notes (Signed)
Pt febrile off and on throughout shift, treated with tylenol at 1630 for temp of 101.2, rechecked at 1800 with 102.4 result, MD paged, no new orders at present, Edward Qualia RN

## 2018-09-17 NOTE — Evaluation (Signed)
Physical Therapy Evaluation Patient Details Name: Brittany [name] MRN: [id number] DOB: [birth year] Today's Date: 09/17/2018   History of Present Illness  PAtient is a 84 y/o female with h/o dementia, myelodysplastic syndrome, shoulder pain, DJD, melanoma, admitted with fever and treated for UTI and aspiration pna.  She was hospitalized 8/30-08/18/18 with sepsis due to aspiration PNA and UTI with Enterobacter and E. Coli bacteremia   Clinical Impression  Patient presents with decreased independence with mobility due to weakness, decreased balance, decreased activity tolerance and will benefit from skilled PT in the acute setting to allow return to SNF level rehab upon d/c.    Follow Up Recommendations SNF;Supervision/Assistance - 24 hour    Equipment Recommendations  None recommended by PT    Recommendations for Other Services       Precautions / Restrictions Precautions Precautions: Fall Precaution Comments: recent falls at facility       Mobility  Bed Mobility Overal bed mobility: Needs Assistance Bed Mobility: Rolling;Sidelying to Sit;Sit to Supine Rolling: Mod assist Sidelying to sit: Mod assist;HOB elevated   Sit to supine: Mod assist   General bed mobility comments: increased time, mod cues, assist to lift trunk; assist for legs back onto bed  Transfers Overall transfer level: Needs assistance Equipment used: Rolling walker (2 wheeled) Transfers: Sit to/from [facility] Sit to Stand: Mod assist Stand pivot transfers: Min assist       General transfer comment: cues for hand placement, some lifting support and assist for balance to turn and back up to chair  Ambulation/Gait Ambulation/Gait assistance: Mod assist Gait Distance (Feet): 12 Feet Assistive device: Rolling walker (2 wheeled) Gait Pattern/deviations: Step-to pattern;Decreased stride length;Shuffle;Trunk flexed     General Gait Details: walked around bed with RW and assist for balance,  safety, cues for side stepping toward head of bed  Stairs            Wheelchair Mobility    Modified Rankin (Stroke Patients Only)       Balance Overall balance assessment: Needs assistance Sitting-balance support: Feet supported Sitting balance-Leahy Scale: Fair Sitting balance - Comments: when leaning forward sitting at EOB, needs S for safety, but no support, when leaning back leans to R   Standing balance support: Bilateral upper extremity supported Standing balance-Leahy Scale: Poor Standing balance comment: UE support and assist for safety, has fallen at facility when they take her walker away and she tries to use w/c for walker                             Pertinent Vitals/Pain Pain Assessment: Faces Faces Pain Scale: Hurts little more Pain Location: mainly back per family, but also with shoulder issues Pain Descriptors / Indicators: Grimacing;Moaning Pain Intervention(s): Monitored during session;Repositioned;Limited activity within patient's tolerance    Home Living Family/patient expects to be discharged to:: Skilled nursing facility                 Additional Comments: [facility]    Prior Function Level of Independence: Needs assistance   Gait / Transfers Assistance Needed: three weeks ago was able to ambulate to the dining room and toilet herself.  Has been in SNF at [facility] since last admission           Hand Dominance        Extremity/Trunk Assessment   Upper Extremity Assessment Upper Extremity Assessment: Generalized weakness(and history of bilateral frozen shoulder per daughter)  Lower Extremity Assessment Lower Extremity Assessment: RLE deficits/detail;LLE deficits/detail RLE Deficits / Details: AAROM grossly WFL, strength hip flexion 3-/5, knee extension 4-/5 LLE Deficits / Details: AAROM grossly WFL, strength hip flexion 3-/5, knee extension 4-/5    Cervical / Trunk Assessment Cervical / Trunk  Assessment: Other exceptions;Kyphotic Cervical / Trunk Exceptions: scoliotic with tendency to lean to R in sitting, rolls hips and knees to L in supine or reclined position   Communication   Communication: No difficulties  Cognition Arousal/Alertness: Lethargic Behavior During Therapy: Flat affect Overall Cognitive Status: Impaired/Different from baseline Area of Impairment: Following commands                       Following Commands: Follows one step commands with increased time;Follows one step commands consistently       General Comments: usually able to toilet herself, incontinent in bed       General Comments General comments (skin integrity, edema, etc.): bruising bilateral knees daughter reports are from falls at facility.  Discussed possibly getting nursing to assist with toiletingh every 2-3 hours to [facility] and to assist with pt sitting EOB for meals (daughter agrees to be here to supervise)    Exercises     Assessment/Plan    PT Assessment Patient needs continued PT services  PT Problem List Decreased strength;Decreased mobility;Decreased balance;Decreased knowledge of use of DME;Decreased activity tolerance;Decreased cognition       PT Treatment Interventions DME instruction;Therapeutic activities;Gait training;Therapeutic exercise;Patient/family education;Balance training;Functional mobility training    PT Goals (Current goals can be found in the Care Plan section)  Acute Rehab PT Goals Patient Stated Goal: to return to [facility] PT Goal Formulation: With patient/family Time For Goal Achievement: 09/24/18 Potential to Achieve Goals: Good    Frequency Min 2X/week   Barriers to discharge        Co-evaluation               AM-PAC PT "6 Clicks" Daily Activity  Outcome Measure Difficulty turning over in bed (including adjusting bedclothes, sheets and blankets)?: Unable Difficulty moving from lying on back to sitting on the side of the bed? :  Unable Difficulty sitting down on and standing up from a chair with arms (e.g., wheelchair, bedside commode, etc,.)?: Unable Help needed moving to and from a bed to chair (including a wheelchair)?: A Lot Help needed walking in hospital room?: A Lot Help needed climbing 3-5 steps with a railing? : Total 6 Click Score: 8    End of Session Equipment Utilized During Treatment: Gait belt Activity Tolerance: Patient limited by pain Patient left: in bed;with call bell/phone within reach;with family/visitor present Nurse Communication: Mobility status PT Visit Diagnosis: Other abnormalities of gait and mobility (R26.89)    Time: 1419-1500 PT Time Calculation (min) (ACUTE ONLY): 41 min   Charges:   PT Evaluation $PT Eval Moderate Complexity: 1 Mod PT Treatments $Gait Training: 8-22 mins $Therapeutic Activity: 8-22 mins        Magda Kiel, PT Acute Rehabilitation Services [phone] 09/17/2018   Reginia Naas 09/17/2018, 4:54 PM

## 2018-09-17 NOTE — Progress Notes (Addendum)
Initial Nutrition Assessment  DOCUMENTATION CODES:   Not applicable, Obesity unspecified  INTERVENTION:    Magic cup TID with meals, each supplement provides 290 kcal and 9 grams of protein.  Yogurt, pudding with meals and as snacks as desired.   NUTRITION DIAGNOSIS:   Inadequate oral intake related to dysphagia as evidenced by per patient/family report.  GOAL:   Patient will meet greater than or equal to 90% of their needs  MONITOR:   PO intake, Supplement acceptance  REASON FOR ASSESSMENT:   Low Braden    ASSESSMENT:   84 yo female with PMH of melanoma, diverticulosis, HLD, hypothyroidism, osteoporosis, myelodysplasia, anemia, HTN, DJD, CVA, dementia who was admitted on 10/2 with sepsis r/t UTI.   Patient's family members in room with patient during RD visit report that patient has been eating poorly. She likes soft foods, like ice cream, pudding, and yogurt. She is receiving chopped meats with meals on dysphagia 3 diet with thin liquids. SLP following for dysphagia; family did not want patient to have thickened liquids as patient is 84 years old. They would like the least restrictive diet to encourage good intake. Family to assist patient with ordering meals.  Per family, patient has scoliosis and has difficulty walking, which is likely the cause of mild muscle depletion in her legs.   Labs and medications reviewed.   Per review of weight encounters, weight has been stable for the past year.   NUTRITION - FOCUSED PHYSICAL EXAM:    Most Recent Value  Orbital Region  No depletion  Upper Arm Region  No depletion  Thoracic and Lumbar Region  Unable to assess  Buccal Region  Unable to assess  Temple Region  No depletion  Clavicle Bone Region  No depletion  Clavicle and Acromion Bone Region  No depletion  Scapular Bone Region  No depletion  Dorsal Hand  No depletion  Patellar Region  Mild depletion  Anterior Thigh Region  Mild depletion  Posterior Calf Region   Mild depletion  Edema (RD Assessment)  None  Hair  Reviewed  Eyes  Unable to assess  Mouth  Unable to assess  Skin  Reviewed  Nails  Reviewed       Diet Order:   Diet Order            DIET DYS 3 Room service appropriate? Yes; Fluid consistency: Thin  Diet effective now              EDUCATION NEEDS:   No education needs have been identified at this time  Skin:  Skin Assessment: (skin tear L leg)  Last BM:  10/3  Height:   Ht Readings from Last 1 Encounters:  09/14/18 4\' 11"  (1.499 m)    Weight:   Wt Readings from Last 1 Encounters:  09/17/18 69.6 kg    Ideal Body Weight:  44.5 kg  BMI:  Body mass index is 30.99 kg/m.  Estimated Nutritional Needs:   Kcal:  1400-1600  Protein:  70-80 gm  Fluid:  1.4-1.6 L    Molli Barrows, RD, LDN, [place] Pager [phone] After Hours Pager [phone]

## 2018-09-18 DIAGNOSIS — Z87440 Personal history of urinary (tract) infections: Secondary | ICD-10-CM

## 2018-09-18 DIAGNOSIS — W19XXXD Unspecified fall, subsequent encounter: Secondary | ICD-10-CM

## 2018-09-18 DIAGNOSIS — Z9181 History of falling: Secondary | ICD-10-CM

## 2018-09-18 DIAGNOSIS — R8281 Pyuria: Secondary | ICD-10-CM

## 2018-09-18 DIAGNOSIS — S81812D Laceration without foreign body, left lower leg, subsequent encounter: Secondary | ICD-10-CM

## 2018-09-18 DIAGNOSIS — Z96653 Presence of artificial knee joint, bilateral: Secondary | ICD-10-CM

## 2018-09-18 DIAGNOSIS — R509 Fever, unspecified: Secondary | ICD-10-CM | POA: Diagnosis present

## 2018-09-18 DIAGNOSIS — Z9889 Other specified postprocedural states: Secondary | ICD-10-CM

## 2018-09-18 LAB — URINALYSIS, ROUTINE W REFLEX MICROSCOPIC
BILIRUBIN URINE: NEGATIVE
Glucose, UA: NEGATIVE mg/dL
Ketones, ur: NEGATIVE mg/dL
Nitrite: NEGATIVE
PROTEIN: 30 mg/dL — AB
SPECIFIC GRAVITY, URINE: 1.012 (ref 1.005–1.030)
WBC, UA: >50 WBC/hpf — ABNORMAL HIGH (ref 0–5)
pH: 5 (ref 5.0–8.0)

## 2018-09-18 LAB — CBC
HCT: 21.1 % — ABNORMAL LOW (ref 36.0–46.0)
HEMOGLOBIN: 6.4 g/dL — AB (ref 12.0–15.0)
MCH: 27.2 pg (ref 26.0–34.0)
MCHC: 30.3 g/dL (ref 30.0–36.0)
MCV: 89.8 fL (ref 78.0–100.0)
PLATELETS: 130 10*3/uL — AB (ref 150–400)
RBC: 2.35 MIL/uL — ABNORMAL LOW (ref 3.87–5.11)
RDW: 18.1 % — AB (ref 11.5–15.5)
WBC: 5.2 10*3/uL (ref 4.0–10.5)

## 2018-09-18 LAB — BASIC METABOLIC PANEL
Anion gap: 9 (ref 5–15)
BUN: 29 mg/dL — AB (ref 8–23)
CO2: 21 mmol/L — ABNORMAL LOW (ref 22–32)
CREATININE: 1.32 mg/dL — AB (ref 0.44–1.00)
Calcium: 8 mg/dL — ABNORMAL LOW (ref 8.9–10.3)
Chloride: 112 mmol/L — ABNORMAL HIGH (ref 98–111)
GFR calc Af Amer: 39 mL/min — ABNORMAL LOW (ref >60)
GFR, EST NON AFRICAN AMERICAN: 34 mL/min — AB (ref >60)
GLUCOSE: 125 mg/dL — AB (ref 70–99)
POTASSIUM: 4.1 mmol/L (ref 3.5–5.1)
Sodium: 142 mmol/L (ref 135–145)

## 2018-09-18 LAB — PREPARE RBC (CROSSMATCH)

## 2018-09-18 LAB — ABO/RH: ABO/RH(D): O POS

## 2018-09-18 MED ORDER — LEVOTHYROXINE SODIUM 88 MCG PO TABS
88.0000 ug | ORAL_TABLET | ORAL | Status: DC
  Administered 2018-09-19 – 2018-09-25 (×7): 88 ug via ORAL
  Filled 2018-09-18 (×7): qty 1

## 2018-09-18 MED ORDER — SODIUM CHLORIDE 0.9% IV SOLUTION: Freq: Once | INTRAVENOUS | Status: DC

## 2018-09-18 MED ORDER — SODIUM CHLORIDE 0.9 % IV SOLN
2.0000 g | INTRAVENOUS | Status: DC
  Administered 2018-09-18 – 2018-09-19 (×2): 2 g via INTRAVENOUS
  Filled 2018-09-18 (×3): qty 2

## 2018-09-18 NOTE — Progress Notes (Signed)
CSW discussed discharge planning with patient's SNF, [facility]. Admissions at SNF indicated OT eval is needed in order to obtain new [facility] authorization for patient to return to SNF. Paged MD for OT orders. Patient will require [facility] auth before admitting back to facility. CSW to follow.  Estanislado Emms, [place]

## 2018-09-18 NOTE — Progress Notes (Signed)
CRITICAL VALUE ALERT  Critical Value:HGB 6.4    Date & Time Notied:  09-18-2018/0600AM  Provider Notified: K.Schorr  Orders Received/Actions taken:

## 2018-09-18 NOTE — NC FL2 (Signed)
[place] MEDICAID FL2 LEVEL OF CARE SCREENING TOOL     IDENTIFICATION  Patient Name: Brittany [name] Birthdate: 17-Jun-1924 Sex: female Admission Date (Current Location): 09/16/2018  [facility] and [place] Number:  Herbalist and Address:  [facility], [place] [address], [place], [place] [address]      Provider Number: [id number]  Attending Physician Name and Address:  Flora Lipps, MD  Relative Name and Phone Number:  Carolyna Yerian [phone]     Current Level of Care: Hospital Recommended Level of Care: [place] Prior Approval Number:    Date Approved/Denied:   PASRR Number: [id number]  Discharge Plan: SNF    Current Diagnoses: Patient Active Problem List   Diagnosis Date Noted  . Sepsis secondary to UTI ([place]) 09/16/2018  . Diverticulitis 09/16/2018  . Aspiration pneumonia of right lower lobe ([place]) 08/19/2018  . AKI (acute kidney injury) ([place])   . Leukocytosis 08/15/2018  . Sepsis ([place]) 08/15/2018  . UTI (urinary tract infection) 08/14/2018  . Fatigue 07/28/2018  . Weight loss 07/28/2018  . Actinic keratosis 06/26/2018  . Diastolic CHF ([place]) 27/02/5008  . History of CVA (cerebrovascular accident) 08/08/2017  . CKD (chronic kidney disease) stage 3, GFR 30-59 ml/min (HCC) 08/08/2017  . Anemia, iron deficiency 01/11/2016  . Unstable gait   . Shoulder pain   . History of melanoma 03/20/2015  . Constipation 07/18/2014  . Vascular dementia without behavioral disturbance ([place]) 11/11/2012  . Stroke ([place]) 12/17/2011  . Back pain 04/23/2011  . COLONIC POLYPS, HX OF 06/05/2009  . Myelodysplastic syndrome ([place]) 01/02/2009  . Hypothyroidism 08/21/2007  . Hyperlipidemia 08/21/2007  . Essential hypertension 08/21/2007  . DIVERTICULOSIS, COLON 08/21/2007  . OSTEOPOROSIS 08/21/2007  . SKIN CANCER, HX OF 08/21/2007    Orientation RESPIRATION BLADDER Height & Weight     Self, Time, Place  O2(nasal cannula 2L) Incontinent  Weight: 152 lb 8.9 oz (69.2 kg) Height:     BEHAVIORAL SYMPTOMS/MOOD NEUROLOGICAL BOWEL NUTRITION STATUS      Incontinent Diet(please see DC summary)  AMBULATORY STATUS COMMUNICATION OF NEEDS Skin   Extensive Assist Verbally Normal                       Personal Care Assistance Level of Assistance  Bathing, Feeding, Dressing Bathing Assistance: Limited assistance Feeding assistance: Limited assistance Dressing Assistance: Limited assistance     Functional Limitations Info  Sight, Hearing, Speech Sight Info: Adequate Hearing Info: Adequate Speech Info: Adequate    SPECIAL CARE FACTORS FREQUENCY  PT (By licensed PT)     PT Frequency: 5x/week              Contractures Contractures Info: Not present    Additional Factors Info  Code Status, Allergies Code Status Info: DNR Allergies Info: Lisinopril, Penicillins           Current Medications (09/18/2018):  This is the current hospital active medication list Current Facility-Administered Medications  Medication Dose Route Frequency Provider Last Rate Last Dose  . 0.9 %  sodium chloride infusion (Manually program via Guardrails IV Fluids)   Intravenous Once Schorr, Rhetta Mura, NP      . acetaminophen (TYLENOL) suppository 650 mg  650 mg Rectal Q4H PRN Karmen Bongo, MD   650 mg at 09/18/18 0658  . albuterol (PROVENTIL) (2.5 MG/3ML) 0.083% nebulizer solution 2.5 mg  2.5 mg Nebulization Q6H PRN Pokhrel, Laxman, MD   2.5 mg at 09/18/18 0228  . aspirin EC  tablet 81 mg  81 mg Oral Daily Pokhrel, Laxman, MD   81 mg at 09/18/18 0901  . ceFEPIme (MAXIPIME) 1 g in sodium chloride 0.9 % 100 mL IVPB  1 g Intravenous Q24H Wynell Balloon, RPH 200 mL/hr at 09/17/18 1015 1 g at 09/17/18 1015  . cycloSPORINE (RESTASIS) 0.05 % ophthalmic emulsion 1 drop  1 drop Both Eyes BID Karmen Bongo, MD   1 drop at 09/17/18 2100  . donepezil (ARICEPT) tablet 10 mg  10 mg Oral QHS Pokhrel, Laxman, MD   10 mg at 09/17/18 2100  .  guaiFENesin (ROBITUSSIN) 100 MG/5ML solution 100 mg  5 mL Oral Q4H PRN Pokhrel, Laxman, MD   100 mg at 09/17/18 1352  . hydrALAZINE (APRESOLINE) injection 5 mg  5 mg Intravenous Q4H PRN Karmen Bongo, MD      . lactated ringers infusion   Intravenous Continuous Flora Lipps, MD 50 mL/hr at 09/17/18 1923    . [START ON 09/19/2018] levothyroxine (SYNTHROID, LEVOTHROID) tablet 88 mcg  88 mcg Oral Q24H Pokhrel, Laxman, MD      . metroNIDAZOLE (FLAGYL) IVPB 500 mg  500 mg Intravenous Lynne Logan, MD 100 mL/hr at 09/18/18 0905 500 mg at 09/18/18 0905  . ondansetron (ZOFRAN) tablet 4 mg  4 mg Oral Q6H PRN Karmen Bongo, MD       Or  . ondansetron [facility]) injection 4 mg  4 mg Intravenous Q6H PRN Karmen Bongo, MD      . polyvinyl alcohol (LIQUIFILM TEARS) 1.4 % ophthalmic solution 1 drop  1 drop Both Eyes Q2H PRN Karmen Bongo, MD      . sodium chloride flush (NS) 0.9 % injection 3 mL  3 mL Intravenous Q12H Karmen Bongo, MD   3 mL at 09/18/18 0907  . vancomycin (VANCOCIN) IVPB 750 mg/150 ml premix  750 mg Intravenous Q24H Skeet Simmer, RPH 150 mL/hr at 09/17/18 1759 750 mg at 09/17/18 1759     Discharge Medications: Please see discharge summary for a list of discharge medications.  Relevant Imaging Results:  Relevant Lab Results:   Additional Information SSN: [id number]  Estanislado Emms, LCSW

## 2018-09-18 NOTE — Progress Notes (Addendum)
PROGRESS NOTE    Brittany [name]  IEP:[id number] DOB: 15-Jun-1924 DOA: 09/16/2018 PCP: Blanchie Serve, MD   Brief Narrative: Patient is a 84 years old female with past medical history of dementia, myelodysplastic syndrome, hypothyroidism, hypertension, hyperlipidemia, stage III CKD, history of CVA in 2013 presented to the hospital with fever and altered mental status.  Patient was brought in from skilled nursing facility for a temperature of 103 F and elevated WBC of 17,000.  Of note, patient was recently hospitalized and discharged on 08/18/2018 with sepsis secondary to aspiration pneumonia and UTI with Enterobacter with E. coli bacteremia.  Following conditions were addressed during hospitalization.  Assessment & Plan:   Principal Problem:   Sepsis secondary to UTI [facility]) Active Problems:   Myelodysplastic syndrome ([place])   Hypothyroidism   Essential hypertension   Vascular dementia without behavioral disturbance (HCC)   CKD (chronic kidney disease) stage 3, GFR 30-59 ml/min (HCC)   AKI (acute kidney injury) ([place])   Aspiration pneumonia of right lower lobe (HCC)   Diverticulitis  Sepsis could be multifactorial likely secondary to UTI from the recent history of E. coli bacteremia, Enterobacter UTI.  CT scan also showed findings concerning for diverticulitis and chest x-ray concerning for right perihilar opacity/aspiration pneumonia.  Patient was given sepsis protocol and started on vancomycin cefepime and Flagyl.     Blood cultures negative in 2 days patient is still febrile..  C. difficile was negative.  WBC trending down.  Lactate of 0.9.  procalcitonin of 1.6.  T-max of 102.6 F.  Urinalysis is abnormal, urine culture pending.  Will follow urine cultures. Will consult infectious disease.   Significant anemia.  Patient has a history of myelodysplastic syndrome.  Hemoglobin of 6.4 today.  No evidence of bleeding.  Patient will receive 1 unit of packed RBC today.  Repeat CBC in a.m.  Will check  stool for Hemoccult.  She does receive Aranesp 300 mcg once monthly as outpatient with her hematologist Dr Alvy Bimler.  Acute kidney injury chronic kidney disease stage III likely secondary to hypotension volume depletion and sepsis.  Improved.  We will closely monitor BMP.  Vascular dementia.  Continue supportive care.  Baseline patient is oriented to person and place.  Started back on Aricept.  History of aspiration pneumonia, dysphagia.  Speech and swallow evaluation recommended dysphagia 3 diet and patient is a mild aspiration risk.  Chest x-ray shows right lower lobe infiltrate.  Patient is empirically on cefepime and metronidazole.  Hypothyroidism.  Resumed Synthroid  Myelodysplastic syndrome.  Hemoglobin of 6.4.  Recently received Aranesp on 9/12.  Will transfuse 1 unit of packed RBC today.  Repeat imaging a.m.  Hypertension.  On PRN hydralazine.  Cozaar on hold.   DVT prophylaxis: We will hold Lovenox in view of significant anemia  Code Status: DNR  Family Communication: I spoke with the patient's daughter in law at bedside at length and updated her about the clinical condition of the patient..  Disposition Plan: Skilled nursing facility  Consultants: None  Procedures: None  Antimicrobials: Vancomycin, cefepime, Flagyl  Anti-infectives (From admission, onward)   Start     Dose/Rate Route Frequency Ordered Stop   09/18/18 1600  ceFEPIme (MAXIPIME) 2 g in sodium chloride 0.9 % 100 mL IVPB     2 g 200 mL/hr over 30 Minutes Intravenous Every 24 hours 09/18/18 1358     09/18/18 1200  vancomycin (VANCOCIN) IVPB 1000 mg/200 mL premix  Status:  Discontinued     1,000 mg 200 mL/hr  over 60 Minutes Intravenous Every 48 hours 09/16/18 0918 09/17/18 1151   09/17/18 1400  vancomycin (VANCOCIN) IVPB 750 mg/150 ml premix     750 mg 150 mL/hr over 60 Minutes Intravenous Every 24 hours 09/17/18 1151     09/17/18 1000  ceFEPIme (MAXIPIME) 1 g in sodium chloride 0.9 % 100 mL IVPB  Status:   Discontinued     1 g 200 mL/hr over 30 Minutes Intravenous Every 24 hours 09/16/18 0918 09/18/18 1358   09/16/18 1600  metroNIDAZOLE (FLAGYL) IVPB 500 mg     500 mg 100 mL/hr over 60 Minutes Intravenous Every 8 hours 09/16/18 0905     09/16/18 0915  ceFEPIme (MAXIPIME) 2 g in sodium chloride 0.9 % 100 mL IVPB  Status:  Discontinued     2 g 200 mL/hr over 30 Minutes Intravenous  Once 09/16/18 0905 09/17/18 1147   09/16/18 0915  vancomycin (VANCOCIN) IVPB 1000 mg/200 mL premix     1,000 mg 200 mL/hr over 60 Minutes Intravenous  Once 09/16/18 0905 09/16/18 1324   09/16/18 0615  metroNIDAZOLE (FLAGYL) IVPB 500 mg     500 mg 100 mL/hr over 60 Minutes Intravenous  Once 09/16/18 0607 09/16/18 0758   09/16/18 0200  cefTRIAXone (ROCEPHIN) 1 g in sodium chloride 0.9 % 100 mL IVPB     1 g 200 mL/hr over 30 Minutes Intravenous  Once 09/16/18 0146 09/16/18 0302      Subjective:  Patient complains of some weakness.  Denies any nausea vomiting or urinary problems.  Denies excessive coughing or chest pain.  Objective: Vitals:   09/18/18 1050 09/18/18 1130 09/18/18 1138 09/18/18 1153  BP:  (!) 144/64 (!) 144/64 (!) 150/57  Pulse:  (!) 108 (!) 108   Resp:  (!) 32 (!) 32   Temp:  98.4 F (36.9 C) 98.4 F (36.9 C) (!) 102.6 F (39.2 C)  TempSrc:  Oral Oral Axillary  SpO2: 99% 99%    Weight:        Intake/Output Summary (Last 24 hours) at 09/18/2018 1428 Last data filed at 09/18/2018 1300 Gross per 24 hour  Intake 636.51 ml  Output 300 ml  Net 336.51 ml   Filed Weights   09/17/18 0407 09/18/18 0700  Weight: 69.6 kg 69.2 kg   Body mass index is 30.81 kg/m.  Physical Examination: General exam: Appears calm and comfortable ,Not in distress, chronically ill HEENT:PERRL,Oral mucosa moist Respiratory system: Diminished breath sounds bilaterally.  Cardiovascular system: S1 & S2 heard, RRR.  Gastrointestinal system: Abdomen is nondistended, soft and nontender. No organomegaly or masses  felt. Normal bowel sounds heard. Central nervous system: Alert awake and communicative.  No focal neurological deficits. Extremities: Trace bilateral lower extremity edema, no clubbing ,no cyanosis, distal peripheral pulses palpable. Skin: No rashes, lesions or ulcers,no icterus ,no pallor  Data Reviewed: I have personally reviewed following labs and imaging studies  CBC: Recent Labs  Lab 09/16/18 0050 09/18/18 0511  WBC 12.0* 5.2  NEUTROABS 9.0*  --   HGB 8.4* 6.4*  HCT 27.3* 21.1*  MCV 89.8 89.8  PLT 215 294*   Basic Metabolic Panel: Recent Labs  Lab 09/16/18 0050 09/17/18 0445 09/18/18 0511  NA 138 141 142  K 4.4 3.9 4.1  CL 107 114* 112*  CO2 20* 22 21*  GLUCOSE 138* 114* 125*  BUN 41* 25* 29*  CREATININE 2.06* 1.29* 1.32*  CALCIUM 8.5* 8.1* 8.0*   GFR: Estimated Creatinine Clearance: 23 mL/min (A) (by C-G formula  based on SCr of 1.32 mg/dL (H)). Liver Function Tests: Recent Labs  Lab 09/16/18 0050 09/17/18 0445  AST 57* 43*  ALT 77* 61*  ALKPHOS 104 97  BILITOT 1.2 1.0  PROT 5.8* 5.2*  ALBUMIN 2.9* 2.3*   No results for input(s): LIPASE, AMYLASE in the last 168 hours. No results for input(s): AMMONIA in the last 168 hours. Coagulation Profile: Recent Labs  Lab 09/16/18 0050  INR 1.20   Cardiac Enzymes: No results for input(s): CKTOTAL, CKMB, CKMBINDEX, TROPONINI in the last 168 hours. BNP (last 3 results) No results for input(s): PROBNP in the last 8760 hours. HbA1C: No results for input(s): HGBA1C in the last 72 hours. CBG: No results for input(s): GLUCAP in the last 168 hours. Lipid Profile: No results for input(s): CHOL, HDL, LDLCALC, TRIG, CHOLHDL, LDLDIRECT in the last 72 hours. Thyroid Function Tests: No results for input(s): TSH, T4TOTAL, FREET4, T3FREE, THYROIDAB in the last 72 hours. Anemia Panel: No results for input(s): VITAMINB12, FOLATE, FERRITIN, TIBC, IRON, RETICCTPCT in the last 72 hours. Sepsis Labs: Recent Labs  Lab  09/16/18 0128 09/16/18 0912  PROCALCITON  --  1.62  LATICACIDVEN 0.98  --     Recent Results (from the past 240 hour(s))  Culture, blood (Routine x 2)     Status: None (Preliminary result)   Collection Time: 09/16/18 12:55 AM  Result Value Ref Range Status   Specimen Description BLOOD BLOOD LEFT FOREARM  Final   Special Requests   Final    BOTTLES DRAWN AEROBIC AND ANAEROBIC Blood Culture adequate volume   Culture   Final    NO GROWTH 2 DAYS Performed at [facility], [place] [address] [place], [place] [address]    Report Status PENDING  Incomplete  Culture, blood (Routine x 2)     Status: None (Preliminary result)   Collection Time: 09/16/18 12:57 AM  Result Value Ref Range Status   Specimen Description BLOOD LEFT WRIST  Final   Special Requests   Final    BOTTLES DRAWN AEROBIC AND ANAEROBIC Blood Culture results may not be optimal due to an inadequate volume of blood received in culture bottles   Culture   Final    NO GROWTH 2 DAYS Performed at [facility], [place] [address], [place], [place] [address]    Report Status PENDING  Incomplete  C difficile quick scan w PCR reflex     Status: None   Collection Time: 09/16/18  2:32 AM  Result Value Ref Range Status   C Diff antigen NEGATIVE NEGATIVE Final   C Diff toxin NEGATIVE NEGATIVE Final   C Diff interpretation No C. difficile detected.  Final    Comment: Performed at [facility], [place] [address] [place], [place] [address]     Radiology Studies: No results found.  Scheduled Meds: . sodium chloride   Intravenous Once  . aspirin EC  81 mg Oral Daily  . cycloSPORINE  1 drop Both Eyes BID  . donepezil  10 mg Oral QHS  . [START ON 09/19/2018] levothyroxine  88 mcg Oral Q24H  . sodium chloride flush  3 mL Intravenous Q12H   Continuous Infusions: . ceFEPime (MAXIPIME) IV    . lactated ringers 50 mL/hr at 09/17/18 1923  . metronidazole 500 mg (09/18/18 0905)  . vancomycin 750 mg (09/17/18 1759)     LOS:  2 days   Time spent: More than 50% of that time was spent in counseling and/or  coordination of care.  Flora Lipps, MD [facility] Pager [phone]  If 7PM-7AM, please contact night-coverage [email or web address] Password [email or web address] 09/18/2018, 2:28 PM

## 2018-09-18 NOTE — Consult Note (Signed)
[facility] for Infectious Disease    Date of Admission:  09/16/2018           Day 3 vancomycin        Day 3 metronidazole        Day 2 cefepime       Reason for Consult: Persistent fever    Referring Provider: Dr. Windy Fast  Assessment: The source of her fever remains uncertain.  Urinary tract infection is certainly possible.  I am not convinced that she has pneumonia based on the CT scan and her benign lung exam.  I am also not certain this is due to diverticulitis as her abdominal exam is extremely benign as well.  I will continue current antibiotics and follow-up tomorrow.  Plan: 1. Continue current antibiotics  Principal Problem:   Fever Active Problems:   Myelodysplastic syndrome (HCC)   Hypothyroidism   Essential hypertension   Vascular dementia without behavioral disturbance (HCC)   CKD (chronic kidney disease) stage 3, GFR 30-59 ml/min (HCC)   AKI (acute kidney injury) ([place])   Diverticulitis   Scheduled Meds: . sodium chloride   Intravenous Once  . aspirin EC  81 mg Oral Daily  . cycloSPORINE  1 drop Both Eyes BID  . donepezil  10 mg Oral QHS  . [START ON 09/19/2018] levothyroxine  88 mcg Oral Q24H  . sodium chloride flush  3 mL Intravenous Q12H   Continuous Infusions: . ceFEPime (MAXIPIME) IV 2 g (09/18/18 1642)  . lactated ringers 50 mL/hr at 09/18/18 1638  . metronidazole 500 mg (09/18/18 0905)  . vancomycin 750 mg (09/17/18 1759)   PRN Meds:.acetaminophen, albuterol, guaiFENesin, hydrALAZINE, ondansetron **OR** ondansetron (ZOFRAN) IV, polyvinyl alcohol  HPI: Brittany [name] is a 84 y.o. female with dementia who was hospitalized from 08/14/2018 until 08/18/2018 with a bacteremic E. coli UTI.  Her daughter-in-law tells me that she recovered fairly uneventfully and was close to her baseline other than being slightly weaker than normal.  She had 2 recent falls on 09/14/2018 and suffered only minor skin tears on her left lower leg.  She developed  sudden fever to 103 degrees associated with decreased mental status leading to readmission on 09/16/2018.  Her family is not aware of any new problems prior to the fever developing.  She was noted to have fecal matter in her vagina in the emergency department.  Her daughter-in-law thinks that is probably because she is not cleaning herself well.  She had an abdominal CT scan which raise concern for possible sigmoid diverticulitis.  There was nothing to suggest a rectovaginal fistula.  Chest x-ray suggested a possible right infrahilar infiltrate but no basilar infiltrates were noted on the CT scan.  She was started on broad empiric antibiotic therapy and has continued to spike fevers.  Today her temperature was up to 103.7 degrees after a unit of blood.  Admission blood cultures remain negative.  Urinalysis obtained today shows pyuria.  Urine culture is pending.   Review of Systems: Review of Systems  Unable to perform ROS: Mental acuity    Past Medical History:  Diagnosis Date  . Blood transfusion 2006  . Colon polyps   . CVA (cerebral infarction) 2013  . Dementia ([place])   . Diverticulosis of colon   . DJD (degenerative joint disease)   . Hyperlipidemia   . Hypertension   . Hypothyroidism   . Melanoma ([place]) 03/20/15   removed with Moh's surgery  .  Myelodysplasia 02/04/2012  . Normochromic normocytic anemia 02/04/2012  . Onychomycosis   . Osteoporosis   . Rectal prolapse   . Shoulder pain   . Unstable gait     Social History   Tobacco Use  . Smoking status: Never Smoker  . Smokeless tobacco: Never Used  Substance Use Topics  . Alcohol use: No  . Drug use: No    Family History  Problem Relation Age of Onset  . Prostate cancer Brother   . Cancer Brother        renal cell carcinoma  . Heart disease Brother   . Breast cancer Mother 49  . Cancer Father        unknown kind  . Arthritis Daughter    Allergies  Allergen Reactions  . Lisinopril     REACTION: angioedema  .  Penicillins     REACTION: rash    OBJECTIVE: Blood pressure (!) 145/50, pulse (!) 109, temperature (!) 103.5 F (39.7 C), temperature source Rectal, resp. rate 20, weight 69.2 kg, SpO2 99 %.  Physical Exam  Constitutional:  She is resting quietly in bed.  Her son and daughter-in-law are visiting.  She will wake up and respond to their questions with short answers.  Eyes: Conjunctivae are normal.  Neck: Neck supple.  Cardiovascular: Normal rate, regular rhythm and normal heart sounds.  Pulmonary/Chest: Effort normal and breath sounds normal. She has no wheezes. She has no rales.  Abdominal: Soft. She exhibits no distension and no mass. There is no tenderness.  Musculoskeletal: She exhibits no edema or tenderness.  Healed incisions from bilateral total knee arthroplasties.  Skin:  Minor skin tears on the left lateral calf without infection.    Lab Results Lab Results  Component Value Date   WBC 5.2 09/18/2018   HGB 6.4 (LL) 09/18/2018   HCT 21.1 (L) 09/18/2018   MCV 89.8 09/18/2018   PLT 130 (L) 09/18/2018    Lab Results  Component Value Date   CREATININE 1.32 (H) 09/18/2018   BUN 29 (H) 09/18/2018   NA 142 09/18/2018   K 4.1 09/18/2018   CL 112 (H) 09/18/2018   CO2 21 (L) 09/18/2018    Lab Results  Component Value Date   ALT 61 (H) 09/17/2018   AST 43 (H) 09/17/2018   ALKPHOS 97 09/17/2018   BILITOT 1.0 09/17/2018     Microbiology: Recent Results (from the past 240 hour(s))  Culture, blood (Routine x 2)     Status: None (Preliminary result)   Collection Time: 09/16/18 12:55 AM  Result Value Ref Range Status   Specimen Description BLOOD BLOOD LEFT FOREARM  Final   Special Requests   Final    BOTTLES DRAWN AEROBIC AND ANAEROBIC Blood Culture adequate volume   Culture   Final    NO GROWTH 2 DAYS Performed at [facility], [place] [address] [place], [place] [address]    Report Status PENDING  Incomplete  Culture, blood (Routine x 2)     Status: None  (Preliminary result)   Collection Time: 09/16/18 12:57 AM  Result Value Ref Range Status   Specimen Description BLOOD LEFT WRIST  Final   Special Requests   Final    BOTTLES DRAWN AEROBIC AND ANAEROBIC Blood Culture results may not be optimal due to an inadequate volume of blood received in culture bottles   Culture   Final    NO GROWTH 2 DAYS Performed at [facility], [place],  [place] [address]    Report Status PENDING  Incomplete  C difficile quick scan w PCR reflex     Status: None   Collection Time: 09/16/18  2:32 AM  Result Value Ref Range Status   C Diff antigen NEGATIVE NEGATIVE Final   C Diff toxin NEGATIVE NEGATIVE Final   C Diff interpretation No C. difficile detected.  Final    Comment: Performed at [facility], [place] [address], [place], [place] [address]    Michel Bickers, [facility] [facility] [phone] pager   [phone] cell 09/18/2018, 5:25 PM

## 2018-09-19 DIAGNOSIS — G9341 Metabolic encephalopathy: Secondary | ICD-10-CM

## 2018-09-19 DIAGNOSIS — D696 Thrombocytopenia, unspecified: Secondary | ICD-10-CM

## 2018-09-19 LAB — URINE CULTURE: CULTURE: NO GROWTH

## 2018-09-19 LAB — BASIC METABOLIC PANEL
Anion gap: 9 (ref 5–15)
BUN: 27 mg/dL — ABNORMAL HIGH (ref 8–23)
CALCIUM: 8.3 mg/dL — AB (ref 8.9–10.3)
CO2: 19 mmol/L — AB (ref 22–32)
CREATININE: 1.2 mg/dL — AB (ref 0.44–1.00)
Chloride: 113 mmol/L — ABNORMAL HIGH (ref 98–111)
GFR calc Af Amer: 44 mL/min — ABNORMAL LOW (ref >60)
GFR calc non Af Amer: 38 mL/min — ABNORMAL LOW (ref >60)
GLUCOSE: 110 mg/dL — AB (ref 70–99)
Potassium: 3.9 mmol/L (ref 3.5–5.1)
Sodium: 141 mmol/L (ref 135–145)

## 2018-09-19 LAB — CBC WITH DIFFERENTIAL/PLATELET
Basophils Absolute: 0.1 10*3/uL (ref 0.0–0.1)
Basophils Relative: 2 %
EOS PCT: 1 %
Eosinophils Absolute: 0 10*3/uL (ref 0.0–0.7)
HEMATOCRIT: 23.8 % — AB (ref 36.0–46.0)
Hemoglobin: 7.3 g/dL — ABNORMAL LOW (ref 12.0–15.0)
LYMPHS ABS: 0.5 10*3/uL — AB (ref 0.7–4.0)
Lymphocytes Relative: 15 %
MCH: 27.7 pg (ref 26.0–34.0)
MCHC: 30.7 g/dL (ref 30.0–36.0)
MCV: 90.2 fL (ref 78.0–100.0)
MONO ABS: 0.2 10*3/uL (ref 0.1–1.0)
MONOS PCT: 6 %
Neutro Abs: 2.8 10*3/uL (ref 1.7–7.7)
Neutrophils Relative %: 76 %
Platelets: 98 10*3/uL — ABNORMAL LOW (ref 150–400)
RBC: 2.64 MIL/uL — AB (ref 3.87–5.11)
RDW: 17.4 % — ABNORMAL HIGH (ref 11.5–15.5)
WBC: 3.6 10*3/uL — AB (ref 4.0–10.5)

## 2018-09-19 LAB — MRSA PCR SCREENING: MRSA by PCR: NEGATIVE

## 2018-09-19 LAB — MAGNESIUM: Magnesium: 1.3 mg/dL — ABNORMAL LOW (ref 1.7–2.4)

## 2018-09-19 LAB — PREPARE RBC (CROSSMATCH)

## 2018-09-19 MED ORDER — MAGNESIUM SULFATE 2 GM/50ML IV SOLN
2.0000 g | Freq: Once | INTRAVENOUS | Status: AC
  Administered 2018-09-19: 2 g via INTRAVENOUS
  Filled 2018-09-19: qty 50

## 2018-09-19 MED ORDER — SODIUM CHLORIDE 0.9% IV SOLUTION
Freq: Once | INTRAVENOUS | Status: AC
  Administered 2018-09-19: via INTRAVENOUS

## 2018-09-19 MED ORDER — ASPIRIN 81 MG PO CHEW
81.0000 mg | CHEWABLE_TABLET | Freq: Every day | ORAL | Status: DC
  Administered 2018-09-19 – 2018-09-25 (×7): 81 mg via ORAL
  Filled 2018-09-19 (×7): qty 1

## 2018-09-19 NOTE — Progress Notes (Signed)
Upon assessing patient, patient's daughter-in-law asked to see pt's bottom. [name] bottom was red, inflamed, and tender to touch. Daughter-in-law (retired Magazine features editor) suspected it was a perirectal abscess, and demanded it be assessed by an attending MD or surgery or infectious disease doctor. Dr. Bridget Hartshorn and triad NP, Lovey Newcomer, were paged by the nurse. Dr. Bridget Hartshorn spoke to daughter-in-law and myself at the nurse's station. Dr. Bridget Hartshorn said "I will see the patient first thing in the morning."  Daughter-in-law was still demanding surgery consult be done tonight, or she was going to transfer her mother-in-law to a different hospital. I reached out to triad NP, Blount, and she also spoke to the mother-in-law on the phone. Dr. Hurley Cisco was paged, and Lovey Newcomer came to see patient at bedside. CT scan of abdomen/pelvis ordered to be done ASAP, and the nurse will call Blount with results. Will continue to monitor the patient closely.

## 2018-09-19 NOTE — Progress Notes (Addendum)
PROGRESS NOTE  Brittany [name]:[id number] DOB: June 21, 1924 DOA: 09/16/2018 PCP: Blanchie Serve, MD  HPI/Recap of past 24 hours:  She recognizes her sons , she knows she is in the hospital in [place], she denies pain She is sleepy and drift back to sleep, family reports this morning patient's mental status has improved, now close to baseline  Last fever 101  at 7pm yesterday  Assessment/Plan: Principal Problem:   Fever Active Problems:   Myelodysplastic syndrome ([place])   Hypothyroidism   Essential hypertension   Vascular dementia without behavioral disturbance ([place])   CKD (chronic kidney disease) stage 3, GFR 30-59 ml/min (HCC)   AKI (acute kidney injury) ([place])   Diverticulitis  Fever of unknown origin/metabolic encephalopathy CT head no acute findings Culture unrevealing CT ab/pel ? Sigmoid diverticulitis? But no ab pain on exam, Perirectal edema tracking distally in the gluteal crease without perirectal abscess or soft tissue air. Last fever 101 at 7pm on 10/4,  Continue broad spectrum abx for now, may need to repeat ct ab/pel if fever persist Fever from mds? will follow infectious disease recommendation  Acute on chronic anemia in the setting of MDS hgb dropped to 6.4 on 10/4, 1prbc transfusion on 10/4 Per family patient has been getting aranasp qmonthly last on 9/12 She is followed by hematology Dr Alvy Bimler  Daughter in law request blood transfusion today, I have discuss the risk and benefit of getting frequent prbc transfusion, she oks to proceed with another prbc transfusion. One unit prbc ordered.   Thrombocytopenia: new diagnosis plt 215 on presentation plt 98 today No heparin exposure this hospitalization, no sign of macrioangiopathy (tbili wnl) abx side effect? Vs from infection vs progressive MDS Monitor  Hypomagnesemia: Replace iv mag, repeat lab in am  Elevated ast/alt -CT ab/pel "No focal hepatic abnormality. No calcified gallstone or pericholecystic  inflammation. No biliary dilatation." -likely due to sepsis -no n/v -repeat lft in am  AKI on CKD III Cr on presentation was 2.06 Cr today is 1.2  H/o CVA with vascular dementia:  On asa, donepezil Baseline recognize sons and aware of situation, not oriented to time, not recognize daughter in law  HTN bp Stable off losartan  hypothyroidism Synthroid  FTT:  Fall at assisted living when try to get up without a walker.  Code Status: DNR  Family Communication: patient and multiple family at bedside  Disposition Plan: not ready to d/c, from snf, will need to return to snf   Consultants:  ID  Procedures:  none  Antibiotics:  As above   Objective: BP (!) 113/53 (BP Location: Left Arm)   Pulse 82   Temp 98.9 F (37.2 C) (Axillary)   Resp (!) 26   Wt 69.9 kg   SpO2 100%   BMI 31.12 kg/m   Intake/Output Summary (Last 24 hours) at 09/19/2018 0819 Last data filed at 09/19/2018 0600 Gross per 24 hour  Intake 5302.64 ml  Output 300 ml  Net 5002.64 ml   Filed Weights   09/17/18 0407 09/18/18 0700 09/19/18 0500  Weight: 69.6 kg 69.2 kg 69.9 kg    Exam: Patient is examined daily including today on 09/19/2018, exams remain the same as of yesterday except that has changed    General:  Frail, chronically ill, sleepy, NAD  Cardiovascular: RRR  Respiratory: diminished at basis, no wheezing  Abdomen: Soft/ND/NT, positive BS  Musculoskeletal: No Edema  Neuro: alert, oriented to sons, know she is in the hospital  Data Reviewed: Basic Metabolic Panel: Recent  Labs  Lab 09/16/18 0050 09/17/18 0445 09/18/18 0511 09/19/18 0453  NA 138 141 142 141  K 4.4 3.9 4.1 3.9  CL 107 114* 112* 113*  CO2 20* 22 21* 19*  GLUCOSE 138* 114* 125* 110*  BUN 41* 25* 29* 27*  CREATININE 2.06* 1.29* 1.32* 1.20*  CALCIUM 8.5* 8.1* 8.0* 8.3*  MG  --   --   --  1.3*   Liver Function Tests: Recent Labs  Lab 09/16/18 0050 09/17/18 0445  AST 57* 43*  ALT 77* 61*    ALKPHOS 104 97  BILITOT 1.2 1.0  PROT 5.8* 5.2*  ALBUMIN 2.9* 2.3*   No results for input(s): LIPASE, AMYLASE in the last 168 hours. No results for input(s): AMMONIA in the last 168 hours. CBC: Recent Labs  Lab 09/16/18 0050 09/18/18 0511 09/19/18 0453  WBC 12.0* 5.2 3.6*  NEUTROABS 9.0*  --  2.8  HGB 8.4* 6.4* 7.3*  HCT 27.3* 21.1* 23.8*  MCV 89.8 89.8 90.2  PLT 215 130* 98*   Cardiac Enzymes:   No results for input(s): CKTOTAL, CKMB, CKMBINDEX, TROPONINI in the last 168 hours. BNP (last 3 results) No results for input(s): BNP in the last 8760 hours.  ProBNP (last 3 results) No results for input(s): PROBNP in the last 8760 hours.  CBG: No results for input(s): GLUCAP in the last 168 hours.  Recent Results (from the past 240 hour(s))  Culture, blood (Routine x 2)     Status: None (Preliminary result)   Collection Time: 09/16/18 12:55 AM  Result Value Ref Range Status   Specimen Description BLOOD BLOOD LEFT FOREARM  Final   Special Requests   Final    BOTTLES DRAWN AEROBIC AND ANAEROBIC Blood Culture adequate volume   Culture   Final    NO GROWTH 2 DAYS Performed at [facility], [address] [place], [place] [address]    Report Status PENDING  Incomplete  Culture, blood (Routine x 2)     Status: None (Preliminary result)   Collection Time: 09/16/18 12:57 AM  Result Value Ref Range Status   Specimen Description BLOOD LEFT WRIST  Final   Special Requests   Final    BOTTLES DRAWN AEROBIC AND ANAEROBIC Blood Culture results may not be optimal due to an inadequate volume of blood received in culture bottles   Culture   Final    NO GROWTH 2 DAYS Performed at [facility], [place] [address], [place], [place] [address]    Report Status PENDING  Incomplete  C difficile quick scan w PCR reflex     Status: None   Collection Time: 09/16/18  2:32 AM  Result Value Ref Range Status   C Diff antigen NEGATIVE NEGATIVE Final   C Diff toxin NEGATIVE NEGATIVE Final    C Diff interpretation No C. difficile detected.  Final    Comment: Performed at [facility], [place] [address], [place], [place] [address]     Studies: No results found.  Scheduled Meds: . sodium chloride   Intravenous Once  . aspirin  81 mg Oral Daily  . cycloSPORINE  1 drop Both Eyes BID  . donepezil  10 mg Oral QHS  . levothyroxine  88 mcg Oral Q24H  . sodium chloride flush  3 mL Intravenous Q12H    Continuous Infusions: . ceFEPime (MAXIPIME) IV 2 g (09/18/18 1642)  . lactated ringers 50 mL/hr at 09/19/18 0013  . magnesium sulfate 1 - 4  g bolus IVPB    . metronidazole 500 mg (09/19/18 0017)  . vancomycin 750 mg (09/18/18 2023)     Time spent: 35 mins I have personally reviewed and interpreted on  09/19/2018 daily labs, tele strips, imagings as discussed above under date review session and assessment and plans.  I reviewed all nursing notes, pharmacy notes, consultant notes,  vitals, pertinent old records  I have discussed plan of care as described above with RN , patient and family on 09/19/2018   Florencia Reasons MD, PhD  [facility] Pager [phone]. If 7PM-7AM, please contact night-coverage at [email or web address], password [facility] 09/19/2018, 8:19 AM  LOS: 3 days

## 2018-09-19 NOTE — Progress Notes (Signed)
Patient ID: Brittany [name], female   DOB: October 02, 1924, 84 y.o.   MRN: [id number]         [facility] for Infectious Disease  Date of Admission:  09/16/2018           Day 4 vancomycin        Day 4 metronidazole        Day 3 cefepime ASSESSMENT: The exact cause of her fever remains unclear.  She is afebrile this morning and her mental status is improving.  Her MRSA PCR was negative on her recent admission.  I will discontinue vancomycin now.  PLAN: 1. Continue empiric cefepime and metronidazole 2. Discontinue vancomycin 3. I will follow-up tomorrow  Principal Problem:   Fever Active Problems:   Myelodysplastic syndrome (HCC)   Hypothyroidism   Essential hypertension   Vascular dementia without behavioral disturbance (HCC)   CKD (chronic kidney disease) stage 3, GFR 30-59 ml/min (HCC)   AKI (acute kidney injury) ([place])   Diverticulitis   Scheduled Meds: . sodium chloride   Intravenous Once  . aspirin  81 mg Oral Daily  . cycloSPORINE  1 drop Both Eyes BID  . donepezil  10 mg Oral QHS  . levothyroxine  88 mcg Oral Q24H  . sodium chloride flush  3 mL Intravenous Q12H   Continuous Infusions: . ceFEPime (MAXIPIME) IV 2 g (09/18/18 1642)  . lactated ringers 50 mL/hr at 09/19/18 0013  . magnesium sulfate 1 - 4 g bolus IVPB    . metronidazole 500 mg (09/19/18 1010)  . vancomycin 750 mg (09/18/18 2023)   PRN Meds:.acetaminophen, albuterol, guaiFENesin, hydrALAZINE, ondansetron **OR** ondansetron (ZOFRAN) IV, polyvinyl alcohol   SUBJECTIVE: She is more alert and interactive today.  She denies any pain or recent dysuria.  Review of Systems: Review of Systems  Constitutional: Positive for fever. Negative for chills and diaphoresis.  Respiratory: Negative for cough.   Cardiovascular: Negative for chest pain.  Gastrointestinal: Negative for abdominal pain, diarrhea, nausea and vomiting.  Genitourinary: Negative for dysuria.  Skin: Negative for rash.    Allergies    Allergen Reactions  . Lisinopril     REACTION: angioedema  . Penicillins     REACTION: rash    OBJECTIVE: Vitals:   09/18/18 2347 09/19/18 0500 09/19/18 0712 09/19/18 1054  BP: (!) 114/50  (!) 113/53   Pulse: 98  82 (!) 105  Resp: (!) 28  (!) 26 18  Temp: 99.2 F (37.3 C)  98.9 F (37.2 C) 99.5 F (37.5 C)  TempSrc: Rectal  Axillary Rectal  SpO2: 98%  100%   Weight:  69.9 kg     Body mass index is 31.12 kg/m.  Physical Exam  Constitutional:  She is sleeping but arouses easily and answers questions appropriately.  HENT:  Mouth/Throat: No oropharyngeal exudate.  Neck: Neck supple.  Cardiovascular: Normal rate, regular rhythm and normal heart sounds.  Pulmonary/Chest: Effort normal and breath sounds normal. She has no rales.  Abdominal: Soft. She exhibits no distension. There is no tenderness.  Musculoskeletal: She exhibits no edema or tenderness.  Skin: No rash noted.    Lab Results Lab Results  Component Value Date   WBC 3.6 (L) 09/19/2018   HGB 7.3 (L) 09/19/2018   HCT 23.8 (L) 09/19/2018   MCV 90.2 09/19/2018   PLT 98 (L) 09/19/2018    Lab Results  Component Value Date   CREATININE 1.20 (H) 09/19/2018   BUN 27 (H) 09/19/2018   NA 141 09/19/2018  K 3.9 09/19/2018   CL 113 (H) 09/19/2018   CO2 19 (L) 09/19/2018    Lab Results  Component Value Date   ALT 61 (H) 09/17/2018   AST 43 (H) 09/17/2018   ALKPHOS 97 09/17/2018   BILITOT 1.0 09/17/2018     Microbiology: Recent Results (from the past 240 hour(s))  Culture, blood (Routine x 2)     Status: None (Preliminary result)   Collection Time: 09/16/18 12:55 AM  Result Value Ref Range Status   Specimen Description BLOOD BLOOD LEFT FOREARM  Final   Special Requests   Final    BOTTLES DRAWN AEROBIC AND ANAEROBIC Blood Culture adequate volume   Culture   Final    NO GROWTH 2 DAYS Performed at [facility], [place] [address] [place], [place] [address]    Report Status PENDING  Incomplete   Culture, blood (Routine x 2)     Status: None (Preliminary result)   Collection Time: 09/16/18 12:57 AM  Result Value Ref Range Status   Specimen Description BLOOD LEFT WRIST  Final   Special Requests   Final    BOTTLES DRAWN AEROBIC AND ANAEROBIC Blood Culture results may not be optimal due to an inadequate volume of blood received in culture bottles   Culture   Final    NO GROWTH 2 DAYS Performed at [facility], [place] [address], [place], [place] [address]    Report Status PENDING  Incomplete  C difficile quick scan w PCR reflex     Status: None   Collection Time: 09/16/18  2:32 AM  Result Value Ref Range Status   C Diff antigen NEGATIVE NEGATIVE Final   C Diff toxin NEGATIVE NEGATIVE Final   C Diff interpretation No C. difficile detected.  Final    Comment: Performed at [facility], [place] [address] [place], [place] [address]    Michel Bickers, [place] [place] [facility] [phone] pager   [phone] cell 09/19/2018, 11:47 AM

## 2018-09-19 NOTE — Progress Notes (Signed)
Pharmacy Antibiotic Note  Brittany [name] is a 84 y.o. female admitted on 09/16/2018 with fevers.  Pharmacy has been consulted for vancomycin and cefepime dosing. Renal function has improved since admission, cultures are currently NGTD.   Plan: -Continue vancomycin 750mg  IV q24h for now -Continue cefepime 2g IV q24h -Metronidazole per MD -Will follow-up ID recs, if vancomycin continued will check trough 10/6  Weight: 154 lb 1.6 oz (69.9 kg)  Temp (24hrs), Avg:100.6 F (38.1 C), Min:98.4 F (36.9 C), Max:103.7 F (39.8 C)  Recent Labs  Lab 09/16/18 0050 09/16/18 0128 09/17/18 0445 09/18/18 0511 09/19/18 0453  WBC 12.0*  --   --  5.2 3.6*  CREATININE 2.06*  --  1.29* 1.32* 1.20*  LATICACIDVEN  --  0.98  --   --   --     Estimated Creatinine Clearance: 25.5 mL/min (A) (by C-G formula based on SCr of 1.2 mg/dL (H)).    Allergies  Allergen Reactions  . Lisinopril     REACTION: angioedema  . Penicillins     REACTION: rash    Antimicrobials this admission: Cefepime 10/3 >>  Vancomycin 10/2 >>  Metronidazole 10/2 >> Ceftriaxone 10/2 x1  Dose adjustments this admission:   Microbiology results: 10/2 BCx: NGTD 10/4 UCx: sent  10/2 C. Diff: negative  Thank you for allowing pharmacy to be a part of this patient's care.  Arrie Senate, PharmD, BCPS Clinical Pharmacist [phone] Please check AMION for all [facility] numbers 09/19/2018

## 2018-09-19 NOTE — Progress Notes (Signed)
Blood transfusion ordered.  However patient temp is 103.1 rectally.  PRN Tylenol given at this time. Notified Dr. Erlinda Hong, per hospital policy unable to administer blood if Temp is greater than       100.4.  Family notified and verbalized understanding.

## 2018-09-19 NOTE — Evaluation (Signed)
Occupational Therapy Evaluation Patient Details Name: Brittany [name] MRN: [id number] DOB: Jul 14, 1924 Today's Date: 09/19/2018    History of Present Illness PAtient is a 84 y/o female with h/o dementia, myelodysplastic syndrome, shoulder pain, DJD, melanoma, admitted with fever and treated for UTI and aspiration pna.  She was hospitalized 8/30-08/18/18 with sepsis due to aspiration PNA and UTI with Enterobacter and E. Coli bacteremia    Clinical Impression   Pt admitted with above. She demonstrates the below listed deficits and will benefit from continued OT to maximize safety and independence with BADLs.  Pt presents to OT with generalized weakness, decreased activity tolerance, pain, impaired balance, and cognitive deficits.  She requires min - mod A for simple grooming activities sitting EOB, and max - total A for remainder of ADLs.  She was able to move to EOB with max A and side stepped up EOB with mod A.  She resides at ALF at [facility], and has been in SNF for last 3-4 weeks.  Recommend return to SNF for continued therapies to allow her maximize safety and independence with ADLs.       Follow Up Recommendations  SNF    Equipment Recommendations  None recommended by OT    Recommendations for Other Services       Precautions / Restrictions Precautions Precautions: Fall Precaution Comments: recent falls at facility       Mobility Bed Mobility Overal bed mobility: Needs Assistance Bed Mobility: Rolling;Sidelying to Sit;Sit to Supine Rolling: Max assist Sidelying to sit: Max assist   Sit to supine: Total assist   General bed mobility comments: assist with all aspects   Transfers Overall transfer level: Needs assistance Equipment used: Rolling walker (2 wheeled) Transfers: Sit to/from Stand Sit to Stand: Mod assist         General transfer comment: assist for hand placement, and assist to move into standing position.  Cues to extend hips and knees.  Pt  sidestepped up EOB x 4 steps with mod A to help with weight shifts     Balance Overall balance assessment: Needs assistance Sitting-balance support: Feet supported Sitting balance-Leahy Scale: Fair     Standing balance support: Bilateral upper extremity supported Standing balance-Leahy Scale: Poor Standing balance comment: bil UE support and mod A                            ADL either performed or assessed with clinical judgement   ADL Overall ADL's : Needs assistance/impaired Eating/Feeding: Maximal assistance;Bed level;Sitting   Grooming: Wash/dry hands;Wash/dry face;Brushing hair;Minimal assistance;Sitting   Upper Body Bathing: Maximal assistance;Sitting   Lower Body Bathing: Maximal assistance;Sit to/from stand   Upper Body Dressing : Maximal assistance;Sitting   Lower Body Dressing: Total assistance;Sit to/from stand   Toilet Transfer: Moderate assistance;Stand-pivot;BSC;RW Toilet Transfer Details (indicate cue type and reason): simulated  Toileting- Clothing Manipulation and Hygiene: Total assistance;Sit to/from stand       Functional mobility during ADLs: Moderate assistance;Rolling walker       Vision   Additional Comments: Pt keeps eyes closed the majority of the time     Perception     Praxis      Pertinent Vitals/Pain Pain Assessment: Faces Faces Pain Scale: Hurts even more Pain Location: generalized.  Moans when touched anywhere  Pain Descriptors / Indicators: Grimacing;Moaning Pain Intervention(s): Monitored during session;Repositioned;Limited activity within patient's tolerance     Hand Dominance Right   Extremity/Trunk Assessment Upper Extremity  Assessment Upper Extremity Assessment: RUE deficits/detail;LUE deficits/detail RUE Deficits / Details: Pt with very limited ROM bil. shoulders.  She does have adequate ROM to comb her hair  RUE Coordination: decreased gross motor LUE Deficits / Details: Pt with very limited ROM bil.  shoulders LUE Coordination: decreased gross motor   Lower Extremity Assessment Lower Extremity Assessment: Defer to PT evaluation   Cervical / Trunk Assessment Cervical / Trunk Assessment: Other exceptions;Kyphotic Cervical / Trunk Exceptions: scoliotic with tendency to lean to R in sitting, rolls hips and knees to L in supine or reclined position    Communication Communication Communication: Other (comment)(minimal responses )   Cognition Arousal/Alertness: Lethargic Behavior During Therapy: Flat affect Overall Cognitive Status: Impaired/Different from baseline Area of Impairment: Attention;Following commands;Problem solving                   Current Attention Level: Sustained;Focused   Following Commands: Follows one step commands inconsistently;Follows one step commands with increased time     Problem Solving: Slow processing;Decreased initiation;Difficulty sequencing;Requires verbal cues;Requires tactile cues General Comments: Pt sleeping soundly upon OT's arrival, but did rouse to stimuli and max encouragement.  She is slow to respond to questions/commands    General Comments  VSS     Exercises     Shoulder Instructions      Home Living Family/patient expects to be discharged to:: Skilled nursing facility                                 Additional Comments: [facility]      Prior Functioning/Environment Level of Independence: Needs assistance        Comments: Pt has been in SNF portion of [facility] for 3-4 weeks.  She currently has required assist with ambulation and ADLs, but prior to that admission, she was able to go to BR mod I, ambulated to the dining area mod I, fed herself, and assisted with ADLs         OT Problem List: Decreased strength;Decreased activity tolerance;Impaired balance (sitting and/or standing);Decreased cognition;Decreased safety awareness;Decreased knowledge of use of DME or AE;Cardiopulmonary status  limiting activity;Impaired UE functional use;Pain      OT Treatment/Interventions: Self-care/ADL training;Therapeutic exercise;DME and/or AE instruction;Energy conservation;Therapeutic activities;Cognitive remediation/compensation;Patient/family education;Balance training    OT Goals(Current goals can be found in the care plan section) Acute Rehab OT Goals Patient Stated Goal: For her to sit up at meal times, and return to [facility] for rehab  OT Goal Formulation: With patient/family Time For Goal Achievement: 10/03/18 Potential to Achieve Goals: Good ADL Goals Pt Will Perform Eating: with set-up;with supervision;sitting Pt Will Perform Grooming: with mod assist;standing Pt Will Transfer to Toilet: with mod assist;ambulating;regular height toilet;bedside commode;grab bars Pt Will Perform Toileting - Clothing Manipulation and hygiene: with mod assist;sit to/from stand  OT Frequency: Min 2X/week   Barriers to D/C: Decreased caregiver support          Co-evaluation              AM-PAC PT "6 Clicks" Daily Activity     Outcome Measure Help from another person eating meals?: A Lot Help from another person taking care of personal grooming?: A Lot Help from another person toileting, which includes using toliet, bedpan, or urinal?: A Lot Help from another person bathing (including washing, rinsing, drying)?: A Lot Help from another person to put on and taking off regular upper body  clothing?: A Lot Help from another person to put on and taking off regular lower body clothing?: Total 6 Click Score: 11   End of Session Equipment Utilized During Treatment: Rolling walker;Oxygen Nurse Communication: Mobility status  Activity Tolerance: Patient limited by lethargy Patient left: in bed;with call bell/phone within reach;with family/visitor present  OT Visit Diagnosis: Unsteadiness on feet (R26.81)                Time: 3149-7026 OT Time Calculation (min): 40 min Charges:  OT  General Charges $OT Visit: 1 Visit OT Evaluation $OT Eval Moderate Complexity: 1 Mod OT Treatments $Self Care/Home Management : 8-22 mins $Therapeutic Activity: 8-22 mins  Lucille Passy, OTR/L Acute Rehabilitation Services Pager [phone] Office [phone]   Lucille Passy M 09/19/2018, 3:01 PM

## 2018-09-20 ENCOUNTER — Inpatient Hospital Stay (HOSPITAL_COMMUNITY): Payer: Medicare Other

## 2018-09-20 DIAGNOSIS — K6289 Other specified diseases of anus and rectum: Secondary | ICD-10-CM | POA: Diagnosis not present

## 2018-09-20 DIAGNOSIS — K611 Rectal abscess: Secondary | ICD-10-CM

## 2018-09-20 LAB — COMPREHENSIVE METABOLIC PANEL
ALK PHOS: 72 U/L (ref 38–126)
ALT: 31 U/L (ref 0–44)
ANION GAP: 8 (ref 5–15)
AST: 17 U/L (ref 15–41)
Albumin: 2.2 g/dL — ABNORMAL LOW (ref 3.5–5.0)
BUN: 26 mg/dL — ABNORMAL HIGH (ref 8–23)
CALCIUM: 8.6 mg/dL — AB (ref 8.9–10.3)
CO2: 21 mmol/L — AB (ref 22–32)
Chloride: 111 mmol/L (ref 98–111)
Creatinine, Ser: 1.11 mg/dL — ABNORMAL HIGH (ref 0.44–1.00)
GFR calc non Af Amer: 42 mL/min — ABNORMAL LOW (ref >60)
GFR, EST AFRICAN AMERICAN: 48 mL/min — AB (ref >60)
Glucose, Bld: 125 mg/dL — ABNORMAL HIGH (ref 70–99)
Potassium: 3.4 mmol/L — ABNORMAL LOW (ref 3.5–5.1)
Sodium: 140 mmol/L (ref 135–145)
TOTAL PROTEIN: 4.9 g/dL — AB (ref 6.5–8.1)
Total Bilirubin: 1.1 mg/dL (ref 0.3–1.2)

## 2018-09-20 LAB — CBC WITH DIFFERENTIAL/PLATELET
Basophils Absolute: 0 10*3/uL (ref 0.0–0.1)
Basophils Relative: 2 %
EOS ABS: 0 10*3/uL (ref 0.0–0.7)
Eosinophils Relative: 1 %
HCT: 27.5 % — ABNORMAL LOW (ref 36.0–46.0)
HEMOGLOBIN: 8.7 g/dL — AB (ref 12.0–15.0)
LYMPHS ABS: 0.3 10*3/uL — AB (ref 0.7–4.0)
Lymphocytes Relative: 14 %
MCH: 28.2 pg (ref 26.0–34.0)
MCHC: 31.6 g/dL (ref 30.0–36.0)
MCV: 89.3 fL (ref 78.0–100.0)
MONO ABS: 0.1 10*3/uL (ref 0.1–1.0)
MONOS PCT: 6 %
NEUTROS ABS: 1.7 10*3/uL (ref 1.7–7.7)
Neutrophils Relative %: 77 %
Platelets: 117 10*3/uL — ABNORMAL LOW (ref 150–400)
RBC: 3.08 MIL/uL — ABNORMAL LOW (ref 3.87–5.11)
RDW: 17 % — AB (ref 11.5–15.5)
WBC: 2.1 10*3/uL — ABNORMAL LOW (ref 4.0–10.5)

## 2018-09-20 LAB — MAGNESIUM: Magnesium: 1.6 mg/dL — ABNORMAL LOW (ref 1.7–2.4)

## 2018-09-20 MED ORDER — SODIUM CHLORIDE 0.9 % IV SOLN
1.0000 g | Freq: Two times a day (BID) | INTRAVENOUS | Status: DC
  Administered 2018-09-20 – 2018-09-25 (×11): 1 g via INTRAVENOUS
  Filled 2018-09-20 (×13): qty 1

## 2018-09-20 MED ORDER — DIGOXIN 0.25 MG/ML IJ SOLN
0.2500 mg | Freq: Once | INTRAMUSCULAR | Status: AC
  Administered 2018-09-20: 0.25 mg via INTRAVENOUS
  Filled 2018-09-20: qty 2

## 2018-09-20 MED ORDER — DILTIAZEM HCL-DEXTROSE 100-5 MG/100ML-% IV SOLN (PREMIX)
5.0000 mg/h | INTRAVENOUS | Status: DC
  Filled 2018-09-20: qty 100

## 2018-09-20 MED ORDER — DILTIAZEM LOAD VIA INFUSION
10.0000 mg | Freq: Once | INTRAVENOUS | Status: AC
  Administered 2018-09-20: 10 mg via INTRAVENOUS
  Filled 2018-09-20: qty 10

## 2018-09-20 MED ORDER — DILTIAZEM HCL-DEXTROSE 100-5 MG/100ML-% IV SOLN (PREMIX): 5.0000 mg/h | INTRAVENOUS | Status: DC

## 2018-09-20 MED ORDER — MAGNESIUM SULFATE 2 GM/50ML IV SOLN
2.0000 g | Freq: Once | INTRAVENOUS | Status: AC
  Administered 2018-09-20: 2 g via INTRAVENOUS
  Filled 2018-09-20: qty 50

## 2018-09-20 MED ORDER — FUROSEMIDE 10 MG/ML IJ SOLN
20.0000 mg | Freq: Once | INTRAMUSCULAR | Status: AC
  Administered 2018-09-20: 20 mg via INTRAVENOUS
  Filled 2018-09-20: qty 2

## 2018-09-20 MED ORDER — POTASSIUM CHLORIDE CRYS ER 20 MEQ PO TBCR
40.0000 meq | EXTENDED_RELEASE_TABLET | Freq: Once | ORAL | Status: AC
  Administered 2018-09-20: 40 meq via ORAL
  Filled 2018-09-20: qty 2

## 2018-09-20 MED ORDER — FUROSEMIDE 10 MG/ML IJ SOLN
40.0000 mg | Freq: Once | INTRAMUSCULAR | Status: AC
  Administered 2018-09-20: 40 mg via INTRAVENOUS
  Filled 2018-09-20: qty 4

## 2018-09-20 NOTE — Progress Notes (Signed)
Text paged Brittany [name] with results from CT scan and that the patient is now having some crackles bilaterally more prominently heard posteriorly in her bases and she's having a little more labored breathing since her blood. Patient's temp after completion of her blood was 101.1 axillary which she has been experiencing, will give her some Tylenol and continue to monitor.

## 2018-09-20 NOTE — Progress Notes (Signed)
Patient ID: Brittany [name], female   DOB: 1924/04/15, 84 y.o.   MRN: [id number]         [facility]  Date of Admission:  09/16/2018                   Day 5 metronidazole        Day 4 cefepime ASSESSMENT: Her recent fevers are coming from right perirectal cellulitis and phlegmon.  There is no evidence of abscess on repeat CT scan.  I will change her to meropenem.  Her family is requesting general surgical consult as well as transfer to [facility] so she will be closer to her son, Zenia Resides.  PLAN: 1. Change cefepime and metronidazole to meropenem  Principal Problem:   Perirectal inflammation Active Problems:   Fever   Myelodysplastic syndrome (HCC)   Hypothyroidism   Essential hypertension   Vascular dementia without behavioral disturbance (HCC)   CKD (chronic kidney disease) stage 3, GFR 30-59 ml/min (HCC)   AKI (acute kidney injury) ([place])   Diverticulitis   Scheduled Meds: . sodium chloride   Intravenous Once  . aspirin  81 mg Oral Daily  . cycloSPORINE  1 drop Both Eyes BID  . donepezil  10 mg Oral QHS  . levothyroxine  88 mcg Oral Q24H  . sodium chloride flush  3 mL Intravenous Q12H   Continuous Infusions: . ceFEPime (MAXIPIME) IV Stopped (09/19/18 1746)  . metronidazole 500 mg (09/20/18 0809)   PRN Meds:.acetaminophen, albuterol, guaiFENesin, hydrALAZINE, ondansetron **OR** ondansetron (ZOFRAN) IV, polyvinyl alcohol   SUBJECTIVE: She began to complain of buttock pain yesterday afternoon.  Her daughter-in-law noted right gluteal redness and induration.  Review of Systems: Review of Systems  Constitutional: Positive for fever. Negative for chills and diaphoresis.  Respiratory: Negative for cough.   Cardiovascular: Negative for chest pain.  Gastrointestinal: Negative for abdominal pain, diarrhea, nausea and vomiting.       Right gluteal pain as noted in HPI.  Genitourinary: Negative for dysuria.  Skin: Negative for rash.     Allergies  Allergen Reactions  . Lisinopril     REACTION: angioedema  . Penicillins     REACTION: rash    OBJECTIVE: Vitals:   09/20/18 0115 09/20/18 0322 09/20/18 0339 09/20/18 0815  BP: (!) 126/50 100/64 (!) 141/67 (!) 123/51  Pulse: 93  89 92  Resp:      Temp: 99.3 F (37.4 C)  (!) 101.1 F (38.4 C) 98 F (36.7 C)  TempSrc: Axillary  Axillary Oral  SpO2: 100% 100% 97% 97%  Weight:  74.5 kg     Body mass index is 33.17 kg/m.  Physical Exam  Constitutional:  She is sleeping but arouses easily and answers questions appropriately.  HENT:  Mouth/Throat: No oropharyngeal exudate.  Neck: Neck supple.  Cardiovascular: Normal rate, regular rhythm and normal heart sounds.  Pulmonary/Chest: Effort normal and breath sounds normal. She has no rales.  Abdominal: Soft. She exhibits no distension. There is no tenderness.  Genitourinary:  Genitourinary Comments: She has erythema, induration and pain with palpation along the right gluteal cleft.  Musculoskeletal: She exhibits no edema or tenderness.  Skin: No rash noted.    Lab Results Lab Results  Component Value Date   WBC 2.1 (L) 09/20/2018   HGB 8.7 (L) 09/20/2018   HCT 27.5 (L) 09/20/2018   MCV 89.3 09/20/2018   PLT 117 (L) 09/20/2018    Lab Results  Component Value Date   CREATININE  1.11 (H) 09/20/2018   BUN 26 (H) 09/20/2018   NA 140 09/20/2018   K 3.4 (L) 09/20/2018   CL 111 09/20/2018   CO2 21 (L) 09/20/2018    Lab Results  Component Value Date   ALT 31 09/20/2018   AST 17 09/20/2018   ALKPHOS 72 09/20/2018   BILITOT 1.1 09/20/2018     Microbiology: Recent Results (from the past 240 hour(s))  Culture, blood (Routine x 2)     Status: None (Preliminary result)   Collection Time: 09/16/18 12:55 AM  Result Value Ref Range Status   Specimen Description BLOOD BLOOD LEFT FOREARM  Final   Special Requests   Final    BOTTLES DRAWN AEROBIC AND ANAEROBIC Blood Culture adequate volume   Culture   Final     NO GROWTH 3 DAYS Performed at [facility], [place] [address] [place], [place] [address]    Report Status PENDING  Incomplete  Culture, blood (Routine x 2)     Status: None (Preliminary result)   Collection Time: 09/16/18 12:57 AM  Result Value Ref Range Status   Specimen Description BLOOD LEFT WRIST  Final   Special Requests   Final    BOTTLES DRAWN AEROBIC AND ANAEROBIC Blood Culture results may not be optimal due to an inadequate volume of blood received in culture bottles   Culture   Final    NO GROWTH 3 DAYS Performed at [facility], [place] [address], [place], [place] [address]    Report Status PENDING  Incomplete  C difficile quick scan w PCR reflex     Status: None   Collection Time: 09/16/18  2:32 AM  Result Value Ref Range Status   C Diff antigen NEGATIVE NEGATIVE Final   C Diff toxin NEGATIVE NEGATIVE Final   C Diff interpretation No C. difficile detected.  Final    Comment: Performed at [facility], [place] [address] [place], [place] [address]  Culture, Urine     Status: None   Collection Time: 09/18/18  1:46 PM  Result Value Ref Range Status   Specimen Description URINE, CATHETERIZED  Final   Special Requests NONE  Final   Culture   Final    NO GROWTH Performed at [facility], [address] [place], [place] [address]    Report Status 09/19/2018 FINAL  Final  MRSA PCR Screening     Status: None   Collection Time: 09/19/18  1:34 PM  Result Value Ref Range Status   MRSA by PCR NEGATIVE NEGATIVE Final    Comment:        The GeneXpert MRSA Assay (FDA approved for NASAL specimens only), is one component of a comprehensive MRSA colonization surveillance program. It is not intended to diagnose MRSA infection nor to guide or monitor treatment for MRSA infections. Performed at [facility], [place] [address], [place], [place] [address]     Michel Bickers, [place] [facility] [phone]  pager   [phone] cell 09/20/2018, 11:46 AM

## 2018-09-20 NOTE — Consult Note (Addendum)
[name] Consult Note  [name] 07/16/1924  [id number].    Requesting MD: Xu, MD Chief Complaint/Reason for Consult: cellulitis right gluteal cleft  HPI:  Pleasantly demented 84 y/o F with a PMH myelodysplastic syndrome (Dr. Gorsuch), HTN, vascular dementa, CVA 2013, hypothyroidism, and recent hospitalization (8/30-08/18/18) for aspiration PNA, UTI, and bacteremia who presented to the ED with a cc fever and AMS. Patients sons and her daughter-in-law (retired anesthesiologist) providing history. Per family the patient resides at [name] since the time of her previous hospitalization and was mobilizing independently with a walker prior to this hospitalization. Today family states that they do not know the source of the patients fever but are concerned about a red around in her gluteal cleft - they state the area has been slighty red due to "diaper rash" but recently became firm and tender. Patient takes a baby ASA daily. Past surgical history includes open repair of rectal prolapse and [name] (?2013), segmental right colectomy for endoscopically unresectable colon polyp years ago (not listed in her chart?), appendectomy, vaginal hysterectomy.    ROS: Review of Systems  Constitutional: Positive for fever.  Cardiovascular: Negative for chest pain.  Gastrointestinal: Negative for abdominal pain.  Skin: Positive for rash.  All other systems reviewed and are negative.   Family History  Problem Relation Age of Onset  . Prostate cancer Brother   . Cancer Brother        renal cell carcinoma  . Heart disease Brother   . Breast cancer Mother 42  . Cancer Father        unknown kind  . Arthritis Daughter     Past Medical History:  Diagnosis Date  . Blood transfusion 2006  . Colon polyps   . CVA (cerebral infarction) 2013  . Dementia (HCC)   . Diverticulosis of colon   . DJD (degenerative joint disease)   . Hyperlipidemia   . Hypertension   . Hypothyroidism    . Melanoma (HCC) 03/20/15   removed with Moh's surgery  . Myelodysplasia 02/04/2012  . Normochromic normocytic anemia 02/04/2012  . Onychomycosis   . Osteoporosis   . Rectal prolapse   . Shoulder pain   . Unstable gait     Past Surgical History:  Procedure Laterality Date  . APPENDECTOMY    . CARPAL TUNNEL RELEASE Bilateral 2006  . FLEXIBLE SIGMOIDOSCOPY  07/09/2012   Procedure: FLEXIBLE SIGMOIDOSCOPY;  Surgeon: Robert D Kaplan, MD;  Location: [facility];  Service: Endoscopy;  Laterality: N/A;  . MELANOMA EXCISION Right 2005   arm  . POLYPECTOMY  2004   Laparoscopic  . RECTOCELE REPAIR    . REPLACEMENT TOTAL KNEE  2005 & 2006  . VAGINAL HYSTERECTOMY  1989    Social History:  reports that she has never smoked. She has never used smokeless tobacco. She reports that she does not drink alcohol or use drugs.  Allergies:  Allergies  Allergen Reactions  . Lisinopril     REACTION: angioedema  . Penicillins     REACTION: rash    Medications Prior to Admission  Medication Sig Dispense Refill  . acetaminophen (TYLENOL) 500 MG tablet Take 500 mg by mouth every 8 (eight) hours as needed for mild pain.  30 tablet   . aspirin EC 81 MG tablet Take 81 mg by mouth daily.    . Calcium Carbonate-Vit D-Min (CALTRATE 600+D PLUS) 600-400 MG-UNIT per tablet Take 1 tablet by mouth daily.      . Cholecalciferol (  VITAMIN D-3 PO) Take 1,000 mg by mouth daily.     . cycloSPORINE (RESTASIS) 0.05 % ophthalmic emulsion Place 1 drop into both eyes 2 (two) times daily.      Marland Kitchen donepezil (ARICEPT) 10 MG tablet Take 10 mg by mouth daily.    . hydroxypropyl methylcellulose / hypromellose (ISOPTO TEARS / GONIOVISC) 2.5 % ophthalmic solution Place 1 drop into both eyes every 2 (two) hours as needed for dry eyes.    Marland Kitchen levothyroxine (SYNTHROID, LEVOTHROID) 88 MCG tablet TAKE 1 TABLET BY MOUTH ONCE DAILY (Patient taking differently: Take 88 mcg by mouth daily before breakfast. ) 90 tablet 1  . losartan (COZAAR)  50 MG tablet Take 1 tablet (50 mg total) by mouth daily. 30 tablet 0  . Multiple Vitamin (MULTIVITAMIN WITH MINERALS) TABS Take 1 tablet by mouth daily.    . Nutritional Supplements (NUTRITIONAL SHAKE PO) Take 120 mLs by mouth 2 (two) times daily.    . polyethylene glycol powder (GLYCOLAX/MIRALAX) powder Take 17 g by mouth daily as needed for mild constipation.     . Sodium Fluoride (PREVIDENT 5000 BOOSTER PLUS) 1.1 % PSTE Place 1 application onto teeth daily.       Blood pressure (!) 123/51, pulse 92, temperature 98 F (36.7 C), temperature source Oral, resp. rate 18, weight 74.5 kg, SpO2 97 %. Physical Exam: Physical Exam  Constitutional: She appears well-developed and well-nourished. No distress.  HENT:  Head: Normocephalic and atraumatic.  Right Ear: External ear normal.  Left Ear: External ear normal.  Mouth/Throat: Oropharynx is clear and moist.  Eyes: EOM are normal. Right eye exhibits no discharge. Left eye exhibits no discharge. No scleral icterus.  Neck: Normal range of motion. No tracheal deviation present. No thyromegaly present.  Cardiovascular: Normal rate, regular rhythm and normal heart sounds. Exam reveals no friction rub.  Pulmonary/Chest: Effort normal and breath sounds normal. No stridor. No respiratory distress. She has no wheezes.  Abdominal: Soft. Bowel sounds are normal. She exhibits no distension. There is no tenderness. There is no guarding.  Genitourinary:  Genitourinary Comments: Cellulitis of R gluteal cleft with roughly 6 x 1-2 cm induration - possibly < 1-2 cm area underlying fluctuance. No gross blood or fluctuance on DRE.   Musculoskeletal: Normal range of motion. She exhibits no tenderness or deformity.  Lymphadenopathy:    She has no cervical adenopathy.  Neurological: She is alert.  Oriented to self and [name].   Skin: Skin is warm and dry. She is not diaphoretic.  Psychiatric: She has a normal mood and affect. Her behavior is normal.     Results for orders placed or performed during the hospital encounter of 09/16/18 (from the past 48 hour(s))  Urinalysis, Routine w reflex microscopic     Status: Abnormal   Collection Time: 09/18/18  1:46 PM  Result Value Ref Range   Color, Urine YELLOW YELLOW   APPearance CLOUDY (A) CLEAR   Specific Gravity, Urine 1.012 1.005 - 1.030   pH 5.0 5.0 - 8.0   Glucose, UA NEGATIVE NEGATIVE mg/dL   Hgb urine dipstick SMALL (A) NEGATIVE   Bilirubin Urine NEGATIVE NEGATIVE   Ketones, ur NEGATIVE NEGATIVE mg/dL   Protein, ur 30 (A) NEGATIVE mg/dL   Nitrite NEGATIVE NEGATIVE   Leukocytes, UA LARGE (A) NEGATIVE   RBC / HPF 21-50 0 - 5 RBC/hpf   WBC, UA >50 (H) 0 - 5 WBC/hpf   Bacteria, UA FEW (A) NONE SEEN   WBC Clumps PRESENT  Mucus PRESENT    Non Squamous Epithelial 0-5 (A) NONE SEEN    Comment: Performed at [facility], [address] [place], [place] [address]  Culture, Urine     Status: None   Collection Time: 09/18/18  1:46 PM  Result Value Ref Range   Specimen Description URINE, CATHETERIZED    Special Requests NONE    Culture      NO GROWTH Performed at [facility], [address] [place], [place] [address]    Report Status 09/19/2018 FINAL   Basic metabolic panel     Status: Abnormal   Collection Time: 09/19/18  4:53 AM  Result Value Ref Range   Sodium 141 135 - 145 mmol/L   Potassium 3.9 3.5 - 5.1 mmol/L   Chloride 113 (H) 98 - 111 mmol/L   CO2 19 (L) 22 - 32 mmol/L   Glucose, Bld 110 (H) 70 - 99 mg/dL   BUN 27 (H) 8 - 23 mg/dL   Creatinine, Ser 1.20 (H) 0.44 - 1.00 mg/dL   Calcium 8.3 (L) 8.9 - 10.3 mg/dL   GFR calc non Af Amer 38 (L) >60 mL/min   GFR calc Af Amer 44 (L) >60 mL/min    Comment: (NOTE) The eGFR has been calculated using the CKD EPI equation. This calculation has not been validated in all clinical situations. eGFR's persistently <60 mL/min signify possible Chronic Kidney Disease.    Anion gap 9 5 - 15    Comment: Performed at  [facility], [address] [place], [place] [address]  Magnesium     Status: Abnormal   Collection Time: 09/19/18  4:53 AM  Result Value Ref Range   Magnesium 1.3 (L) 1.7 - 2.4 mg/dL    Comment: Performed at [facility], [address] [place], [place] [address]  CBC with Differential/Platelet     Status: Abnormal   Collection Time: 09/19/18  4:53 AM  Result Value Ref Range   WBC 3.6 (L) 4.0 - 10.5 K/uL   RBC 2.64 (L) 3.87 - 5.11 MIL/uL   Hemoglobin 7.3 (L) 12.0 - 15.0 g/dL   HCT 23.8 (L) 36.0 - 46.0 %   MCV 90.2 78.0 - 100.0 fL   MCH 27.7 26.0 - 34.0 pg   MCHC 30.7 30.0 - 36.0 g/dL   RDW 17.4 (H) 11.5 - 15.5 %   Platelets 98 (L) 150 - 400 K/uL    Comment: PLATELET COUNT CONFIRMED BY SMEAR   Neutrophils Relative % 76 %   Lymphocytes Relative 15 %   Monocytes Relative 6 %   Eosinophils Relative 1 %   Basophils Relative 2 %   Neutro Abs 2.8 1.7 - 7.7 K/uL   Lymphs Abs 0.5 (L) 0.7 - 4.0 K/uL   Monocytes Absolute 0.2 0.1 - 1.0 K/uL   Eosinophils Absolute 0.0 0.0 - 0.7 K/uL   Basophils Absolute 0.1 0.0 - 0.1 K/uL   RBC Morphology BURR CELLS    WBC Morphology DOHLE BODIES     Comment: HYPERSEGMENTED NEUT Performed at  Hospital Lab, [address] [place], [place] [address]   MRSA PCR Screening     Status: None   Collection Time: 09/19/18  1:34 PM  Result Value Ref Range   MRSA by PCR NEGATIVE NEGATIVE    Comment:        The GeneXpert MRSA Assay (FDA approved for NASAL specimens only), is one component of a comprehensive MRSA colonization surveillance   program. It is not intended to diagnose MRSA infection nor to guide or monitor treatment for MRSA infections. Performed at [facility], [address] [place], [place] [address]   Prepare RBC     Status: None   Collection Time: 09/19/18  4:55 PM  Result Value Ref Range   Order Confirmation      ORDER PROCESSED BY BLOOD BANK Performed at [facility], [address] [place],  [place] [address]   CBC with Differential/Platelet     Status: Abnormal   Collection Time: 09/20/18  7:06 AM  Result Value Ref Range   WBC 2.1 (L) 4.0 - 10.5 K/uL   RBC 3.08 (L) 3.87 - 5.11 MIL/uL   Hemoglobin 8.7 (L) 12.0 - 15.0 g/dL   HCT 27.5 (L) 36.0 - 46.0 %   MCV 89.3 78.0 - 100.0 fL   MCH 28.2 26.0 - 34.0 pg   MCHC 31.6 30.0 - 36.0 g/dL   RDW 17.0 (H) 11.5 - 15.5 %   Platelets 117 (L) 150 - 400 K/uL   Neutrophils Relative % 77 %   Lymphocytes Relative 14 %   Monocytes Relative 6 %   Eosinophils Relative 1 %   Basophils Relative 2 %   Neutro Abs 1.7 1.7 - 7.7 K/uL   Lymphs Abs 0.3 (L) 0.7 - 4.0 K/uL   Monocytes Absolute 0.1 0.1 - 1.0 K/uL   Eosinophils Absolute 0.0 0.0 - 0.7 K/uL   Basophils Absolute 0.0 0.0 - 0.1 K/uL   RBC Morphology Acanthocytes present    WBC Morphology TOXIC GRANULATION     Comment: DOHLE BODIES Performed at [facility], [address] [place], [place] [address]   Comprehensive metabolic panel     Status: Abnormal   Collection Time: 09/20/18  7:06 AM  Result Value Ref Range   Sodium 140 135 - 145 mmol/L   Potassium 3.4 (L) 3.5 - 5.1 mmol/L   Chloride 111 98 - 111 mmol/L   CO2 21 (L) 22 - 32 mmol/L   Glucose, Bld 125 (H) 70 - 99 mg/dL   BUN 26 (H) 8 - 23 mg/dL   Creatinine, Ser 1.11 (H) 0.44 - 1.00 mg/dL   Calcium 8.6 (L) 8.9 - 10.3 mg/dL   Total Protein 4.9 (L) 6.5 - 8.1 g/dL   Albumin 2.2 (L) 3.5 - 5.0 g/dL   AST 17 15 - 41 U/L   ALT 31 0 - 44 U/L   Alkaline Phosphatase 72 38 - 126 U/L   Total Bilirubin 1.1 0.3 - 1.2 mg/dL   GFR calc non Af Amer 42 (L) >60 mL/min   GFR calc Af Amer 48 (L) >60 mL/min    Comment: (NOTE) The eGFR has been calculated using the CKD EPI equation. This calculation has not been validated in all clinical situations. eGFR's persistently <60 mL/min signify possible Chronic Kidney Disease.    Anion gap 8 5 - 15    Comment: Performed at [facility], [address] [place], [place] [address]  Magnesium      Status: Abnormal   Collection Time: 09/20/18  7:06 AM  Result Value Ref Range   Magnesium 1.6 (L) 1.7 - 2.4 mg/dL    Comment: Performed at [facility], [address] [place], [place] [address]   Ct Abdomen Pelvis Wo Contrast  Result Date: 09/20/2018 CLINICAL DATA:  84 y/o F; suspected abscess of the anal and rectal region. EXAM: CT ABDOMEN   AND PELVIS WITHOUT CONTRAST TECHNIQUE: Multidetector CT imaging of the abdomen and pelvis was performed following the standard protocol without IV contrast. COMPARISON:  09/16/2018 CT abdomen and pelvis. FINDINGS: Lower chest: Small bilateral pleural effusions. Hepatobiliary: No focal liver abnormality is seen. No gallstones, gallbladder wall thickening, or biliary dilatation. Pancreas: Unremarkable. No pancreatic ductal dilatation or surrounding inflammatory changes. Spleen: Spleen measures 12.6 x 6.7 x 10.2 cm (volume = 450 cm^3). Adrenals/Urinary Tract: Stable mild adrenal thickening without mass. No hydronephrosis or urinary stone disease. Left kidney lower pole 27 mm cyst with peripheral calcification. Right kidney upper pole 15 mm cyst with peripheral calcifications. Normal bladder. Stomach/Bowel: Stomach is within normal limits. Appendix not identified. No obstructive or inflammatory changes of the small bowel. Mild stable wall thickening of sigmoid colon diverticulosis, wall thickening, and surrounding pericholecystic edema. No perforation or abscess. Vascular/Lymphatic: Aortic atherosclerosis. No enlarged abdominal or pelvic lymph nodes. Reproductive: Status post hysterectomy. No adnexal masses. Other: Stable small volume of fluid in the pelvis. Musculoskeletal: No fracture is seen. Stable edema within the right gluteal cleft. No discrete fluid collection is identified. Moderate lumbar levocurvature with apex at L2. No acute osseous abnormality identified. IMPRESSION: 1. Stable inflammation within the right gluteal cleft. No discrete fluid collection  identified. 2. Stable findings suspicious for acute sigmoid diverticulitis. 3. New small bilateral pleural effusions. 4. Stable mild splenomegaly. 5. Aortic Atherosclerosis (ICD10-I70.0). Electronically Signed   By: Lance  Furusawa-Stratton M.D.   On: 09/20/2018 03:10   Assessment/Plan Myelodysplastic syndrome (HCC) - receiving transfusions - hgb/hct 8.7/27.5 Hypothyroidism Essential hypertension Vascular dementia without behavioral disturbance (HCC) CKD (chronic kidney disease) stage 3, GFR 30-59 ml/min (HCC) AKI (acute kidney injury) (HCC) Diverticulitis  Fever - right gluteal cellulitis vs UTI   Cellulitis right intergluteal cleft  I do suspect this is the source of the patients fever. ID following and starting Merrem. Possible small underlying abscess, not appreciable on CT. Recommend IV Merrem and daily wound checks. Failure to improve may warrant incision and drainage. Agree with palliative care meeting to establish GOC. Patient is a DNR. POA (son) states if I&D were necessary to treat the infection, he would like the procedure to be done, including going under general anesthesia - the patient is certainly moderate to high risk for surgery given her age and comorbidities, and would be at risk for poor wound healing post-operatively.    S , PA-C [facility] 09/20/2018, 11:56 AM Pager: [phone] Consults: [phone]  

## 2018-09-20 NOTE — Progress Notes (Signed)
Patient went to CT scan around 02:30 am with RN in her bed, nurse went with her since she has blood running, will call results to X. Blount as requested when available.

## 2018-09-20 NOTE — Progress Notes (Addendum)
PROGRESS NOTE  Brittany [name] YDX:[id number] DOB: 06/18/1924 DOA: 09/16/2018 PCP: Blanchie Serve, MD  HPI/Recap of past 24 hours:  Last fever 101.1, patient is more alert this am Family at bedside  Assessment/Plan: Principal Problem:   Fever Active Problems:   Myelodysplastic syndrome ([place])   Hypothyroidism   Essential hypertension   Vascular dementia without behavioral disturbance ([place])   CKD (chronic kidney disease) stage 3, GFR 30-59 ml/min (HCC)   AKI (acute kidney injury) ([place])   Diverticulitis  Fever of unknown origin/metabolic encephalopathy CT head no acute findings Culture unrevealing CT ab/pel ? Sigmoid diverticulitis? But no ab pain on exam, Perirectal edema tracking distally in the gluteal crease without perirectal abscess or soft tissue air. Repeat ct last night per family request, no abscess  abx changed to meropenem, will follow infectious disease recommendation General surgery consulted to evaluate right gluteal cellulitis  Acute on chronic anemia in the setting of MDS hgb dropped to 6.4 on 10/4, 1prbc transfusion on 10/4 Per family patient has been getting aranasp qmonthly last on 9/12 She is followed by hematology Dr Alvy Bimler  S/p prbc transfusionx1 unit  on 10/5 per family request, hgb this am  8.7. Has signs of volume overload, small bilateral pleural effusion, + 8liters since admission per I/o's , will give lasix 40mg  iv x1    Thrombocytopenia: new diagnosis plt 215 on presentation plt 98 today No heparin exposure this hospitalization, no sign of macrioangiopathy (tbili wnl) abx side effect? Vs from infection vs progressive MDS plt improving, today 117  Hypokalemia/Hypomagnesemia: Remain low continue to replace, repeat lab in am  Elevated ast/alt -CT ab/pel "No focal hepatic abnormality. No calcified gallstone or pericholecystic inflammation. No biliary dilatation." -likely due to sepsis -no n/v -repeat lft in am, normalized  AKI on CKD  III Cr on presentation was 2.06 Improving , Cr today is 1.1  H/o CVA with vascular dementia:  On asa, donepezil Baseline recognize sons and aware of situation, not oriented to time, not recognize daughter in law  HTN bp Stable off losartan  hypothyroidism Synthroid  FTT:  Fall at assisted living when try to get up without a walker. Family requested palliative care consult  Addendum: Patient developed afib/RVR around 12:50 today, cardiology consulted. Case discussed with Dr Daymon Larsen who graciously accepted the consult. Patient is unassigned, case discussed with cardiology Dr Doylene Canard who will consult on the patient. Appreciate Dr Doylene Canard input. Echo ordered.  Code Status: DNR  Family Communication: patient and multiple family at bedside  Disposition Plan: not ready to d/c, from snf, will need to return to snf   Consultants:  ID  General surgery  Palliative care  Procedures:  none  Antibiotics:  As above   Objective: BP (!) 123/51 (BP Location: Left Arm)   Pulse 92   Temp 98 F (36.7 C) (Oral)   Resp 18   Wt 74.5 kg   SpO2 97%   BMI 33.17 kg/m   Intake/Output Summary (Last 24 hours) at 09/20/2018 0933 Last data filed at 09/20/2018 0359 Gross per 24 hour  Intake 2006.32 ml  Output -  Net 2006.32 ml   Filed Weights   09/18/18 0700 09/19/18 0500 09/20/18 0322  Weight: 69.2 kg 69.9 kg 74.5 kg    Exam: Patient is examined daily including today on 09/20/2018, exams remain the same as of yesterday except that has changed    General:  Frail, chronically ill, alert, NAD  Cardiovascular: RRR  Respiratory: diminished at basis, no wheezing  Abdomen: Soft/ND/NT, positive BS  Musculoskeletal: No Edema  Neuro: alert, oriented to sons, know she is in the hospital  Data Reviewed: Basic Metabolic Panel: Recent Labs  Lab 09/16/18 0050 09/17/18 0445 09/18/18 0511 09/19/18 0453 09/20/18 0706  NA 138 141 142 141 140  K 4.4 3.9 4.1 3.9 3.4*  CL 107  114* 112* 113* 111  CO2 20* 22 21* 19* 21*  GLUCOSE 138* 114* 125* 110* 125*  BUN 41* 25* 29* 27* 26*  CREATININE 2.06* 1.29* 1.32* 1.20* 1.11*  CALCIUM 8.5* 8.1* 8.0* 8.3* 8.6*  MG  --   --   --  1.3* 1.6*   Liver Function Tests: Recent Labs  Lab 09/16/18 0050 09/17/18 0445 09/20/18 0706  AST 57* 43* 17  ALT 77* 61* 31  ALKPHOS 104 97 72  BILITOT 1.2 1.0 1.1  PROT 5.8* 5.2* 4.9*  ALBUMIN 2.9* 2.3* 2.2*   No results for input(s): LIPASE, AMYLASE in the last 168 hours. No results for input(s): AMMONIA in the last 168 hours. CBC: Recent Labs  Lab 09/16/18 0050 09/18/18 0511 09/19/18 0453 09/20/18 0706  WBC 12.0* 5.2 3.6* 2.1*  NEUTROABS 9.0*  --  2.8 1.7  HGB 8.4* 6.4* 7.3* 8.7*  HCT 27.3* 21.1* 23.8* 27.5*  MCV 89.8 89.8 90.2 89.3  PLT 215 130* 98* 117*   Cardiac Enzymes:   No results for input(s): CKTOTAL, CKMB, CKMBINDEX, TROPONINI in the last 168 hours. BNP (last 3 results) No results for input(s): BNP in the last 8760 hours.  ProBNP (last 3 results) No results for input(s): PROBNP in the last 8760 hours.  CBG: No results for input(s): GLUCAP in the last 168 hours.  Recent Results (from the past 240 hour(s))  Culture, blood (Routine x 2)     Status: None (Preliminary result)   Collection Time: 09/16/18 12:55 AM  Result Value Ref Range Status   Specimen Description BLOOD BLOOD LEFT FOREARM  Final   Special Requests   Final    BOTTLES DRAWN AEROBIC AND ANAEROBIC Blood Culture adequate volume   Culture   Final    NO GROWTH 3 DAYS Performed at [facility], [address], [place], [place] [address]    Report Status PENDING  Incomplete  Culture, blood (Routine x 2)     Status: None (Preliminary result)   Collection Time: 09/16/18 12:57 AM  Result Value Ref Range Status   Specimen Description BLOOD LEFT WRIST  Final   Special Requests   Final    BOTTLES DRAWN AEROBIC AND ANAEROBIC Blood Culture results may not be optimal due to an inadequate volume  of blood received in culture bottles   Culture   Final    NO GROWTH 3 DAYS Performed at [facility], [place] [address] [place], [place] [address]    Report Status PENDING  Incomplete  C difficile quick scan w PCR reflex     Status: None   Collection Time: 09/16/18  2:32 AM  Result Value Ref Range Status   C Diff antigen NEGATIVE NEGATIVE Final   C Diff toxin NEGATIVE NEGATIVE Final   C Diff interpretation No C. difficile detected.  Final    Comment: Performed at [facility], [place] [address] [place], [place] [address]  Culture, Urine     Status: None   Collection Time: 09/18/18  1:46 PM  Result Value Ref Range Status   Specimen Description URINE, CATHETERIZED  Final   Special Requests NONE  Final   Culture   Final    NO GROWTH Performed at [facility], [place] [address], [place], [place] [address]    Report Status 09/19/2018 FINAL  Final  MRSA PCR Screening     Status: None   Collection Time: 09/19/18  1:34 PM  Result Value Ref Range Status   MRSA by PCR NEGATIVE NEGATIVE Final    Comment:        The GeneXpert MRSA Assay (FDA approved for NASAL specimens only), is one component of a comprehensive MRSA colonization surveillance program. It is not intended to diagnose MRSA infection nor to guide or monitor treatment for MRSA infections. Performed at [facility], [place] [address] [place], [place] [address]      Studies: Ct Abdomen Pelvis Wo Contrast  Result Date: 09/20/2018 CLINICAL DATA:  84 y/o F; suspected abscess of the anal and rectal region. EXAM: CT ABDOMEN AND PELVIS WITHOUT CONTRAST TECHNIQUE: Multidetector CT imaging of the abdomen and pelvis was performed following the standard protocol without IV contrast. COMPARISON:  09/16/2018 CT abdomen and pelvis. FINDINGS: Lower chest: Small bilateral pleural effusions. Hepatobiliary: No focal liver abnormality is seen. No gallstones, gallbladder wall thickening, or biliary dilatation. Pancreas:  Unremarkable. No pancreatic ductal dilatation or surrounding inflammatory changes. Spleen: Spleen measures 12.6 x 6.7 x 10.2 cm (volume = 450 cm^3). Adrenals/Urinary Tract: Stable mild adrenal thickening without mass. No hydronephrosis or urinary stone disease. Left kidney lower pole 27 mm cyst with peripheral calcification. Right kidney upper pole 15 mm cyst with peripheral calcifications. Normal bladder. Stomach/Bowel: Stomach is within normal limits. Appendix not identified. No obstructive or inflammatory changes of the small bowel. Mild stable wall thickening of sigmoid colon diverticulosis, wall thickening, and surrounding pericholecystic edema. No perforation or abscess. Vascular/Lymphatic: Aortic atherosclerosis. No enlarged abdominal or pelvic lymph nodes. Reproductive: Status post hysterectomy. No adnexal masses. Other: Stable small volume of fluid in the pelvis. Musculoskeletal: No fracture is seen. Stable edema within the right gluteal cleft. No discrete fluid collection is identified. Moderate lumbar levocurvature with apex at L2. No acute osseous abnormality identified. IMPRESSION: 1. Stable inflammation within the right gluteal cleft. No discrete fluid collection identified. 2. Stable findings suspicious for acute sigmoid diverticulitis. 3. New small bilateral pleural effusions. 4. Stable mild splenomegaly. 5. Aortic Atherosclerosis (ICD10-I70.0). Electronically Signed   By: Kristine Garbe M.D.   On: 09/20/2018 03:10    Scheduled Meds: . sodium chloride   Intravenous Once  . aspirin  81 mg Oral Daily  . cycloSPORINE  1 drop Both Eyes BID  . donepezil  10 mg Oral QHS  . furosemide  40 mg Intravenous Once  . levothyroxine  88 mcg Oral Q24H  . potassium chloride  40 mEq Oral Once  . sodium chloride flush  3 mL Intravenous Q12H    Continuous Infusions: . ceFEPime (MAXIPIME) IV Stopped (09/19/18 1746)  . magnesium sulfate 1 - 4 g bolus IVPB    . metronidazole 500 mg (09/20/18  0809)     Time spent: 35 mins, case discussed with general surgery I have personally reviewed and interpreted on  09/20/2018 daily labs, tele strips, imagings as discussed above under date review session and assessment and plans.  I reviewed all nursing notes, pharmacy notes, consultant notes,  vitals, pertinent old records  I have discussed plan of care as described above with RN , patient and family on 09/20/2018   Florencia Reasons MD, PhD  [facility] Pager [phone]. If 7PM-7AM,  please contact night-coverage at [email or web address], password [facility] 09/20/2018, 9:33 AM  LOS: 4 days

## 2018-09-20 NOTE — Consult Note (Signed)
Referring Physician: Carlos Quackenbush is an 84 y.o. female.                       Chief Complaint: Atrial fibrilation with RVR  HPI: 84 year old female with PMH of stroke, dementia, hypertension, hypothyroidism, Myelodysplasia and anemia has cellulitis of right gluteal cleft with fever and tachycardia. No chest pain. No cardiac history per family except hypertension and hyperlipidemia. Chest x-ray is negative for cardiomegaly. EKG shows atrial fibrillation with RVR from sinus tachycardia.  Past Medical History:  Diagnosis Date  . Blood transfusion 2006  . Colon polyps   . CVA (cerebral infarction) 2013  . Dementia ([place])   . Diverticulosis of colon   . DJD (degenerative joint disease)   . Hyperlipidemia   . Hypertension   . Hypothyroidism   . Melanoma ([place]) 03/20/15   removed with Moh's surgery  . Myelodysplasia 02/04/2012  . Normochromic normocytic anemia 02/04/2012  . Onychomycosis   . Osteoporosis   . Rectal prolapse   . Shoulder pain   . Unstable gait       Past Surgical History:  Procedure Laterality Date  . APPENDECTOMY    . CARPAL TUNNEL RELEASE Bilateral 2006  . FLEXIBLE SIGMOIDOSCOPY  07/09/2012   Procedure: FLEXIBLE SIGMOIDOSCOPY;  Surgeon: Inda Castle, MD;  Location: [facility];  Service: Endoscopy;  Laterality: N/A;  . MELANOMA EXCISION Right 2005   arm  . POLYPECTOMY  2004   Laparoscopic  . RECTOCELE REPAIR    . REPLACEMENT TOTAL KNEE  2005 & 2006  . VAGINAL HYSTERECTOMY  1989    Family History  Problem Relation Age of Onset  . Prostate cancer Brother   . Cancer Brother        renal cell carcinoma  . Heart disease Brother   . Breast cancer Mother 52  . Cancer Father        unknown kind  . Arthritis Daughter    Social History:  reports that she has never smoked. She has never used smokeless tobacco. She reports that she does not drink alcohol or use drugs.  Allergies:  Allergies  Allergen Reactions  . Lisinopril     REACTION:  angioedema  . Penicillins     REACTION: rash    Medications Prior to Admission  Medication Sig Dispense Refill  . acetaminophen (TYLENOL) 500 MG tablet Take 500 mg by mouth every 8 (eight) hours as needed for mild pain.  30 tablet   . aspirin EC 81 MG tablet Take 81 mg by mouth daily.    . Calcium Carbonate-Vit D-Min (CALTRATE 600+D PLUS) 600-400 MG-UNIT per tablet Take 1 tablet by mouth daily.      . Cholecalciferol (VITAMIN D-3 PO) Take 1,000 mg by mouth daily.     . cycloSPORINE (RESTASIS) 0.05 % ophthalmic emulsion Place 1 drop into both eyes 2 (two) times daily.      Marland Kitchen donepezil (ARICEPT) 10 MG tablet Take 10 mg by mouth daily.    . hydroxypropyl methylcellulose / hypromellose (ISOPTO TEARS / GONIOVISC) 2.5 % ophthalmic solution Place 1 drop into both eyes every 2 (two) hours as needed for dry eyes.    Marland Kitchen levothyroxine (SYNTHROID, LEVOTHROID) 88 MCG tablet TAKE 1 TABLET BY MOUTH ONCE DAILY (Patient taking differently: Take 88 mcg by mouth daily before breakfast. ) 90 tablet 1  . losartan (COZAAR) 50 MG tablet Take 1 tablet (50 mg total) by mouth daily. 30 tablet 0  .  Multiple Vitamin (MULTIVITAMIN WITH MINERALS) TABS Take 1 tablet by mouth daily.    . Nutritional Supplements (NUTRITIONAL SHAKE PO) Take 120 mLs by mouth 2 (two) times daily.    . polyethylene glycol powder (GLYCOLAX/MIRALAX) powder Take 17 g by mouth daily as needed for mild constipation.     . Sodium Fluoride (PREVIDENT 5000 BOOSTER PLUS) 1.1 % PSTE Place 1 application onto teeth daily.       Results for orders placed or performed during the hospital encounter of 09/16/18 (from the past 48 hour(s))  Urinalysis, Routine w reflex microscopic     Status: Abnormal   Collection Time: 09/18/18  1:46 PM  Result Value Ref Range   Color, Urine YELLOW YELLOW   APPearance CLOUDY (A) CLEAR   Specific Gravity, Urine 1.012 1.005 - 1.030   pH 5.0 5.0 - 8.0   Glucose, UA NEGATIVE NEGATIVE mg/dL   Hgb urine dipstick SMALL (A)  NEGATIVE   Bilirubin Urine NEGATIVE NEGATIVE   Ketones, ur NEGATIVE NEGATIVE mg/dL   Protein, ur 30 (A) NEGATIVE mg/dL   Nitrite NEGATIVE NEGATIVE   Leukocytes, UA LARGE (A) NEGATIVE   RBC / HPF 21-50 0 - 5 RBC/hpf   WBC, UA >50 (H) 0 - 5 WBC/hpf   Bacteria, UA FEW (A) NONE SEEN   WBC Clumps PRESENT    Mucus PRESENT    Non Squamous Epithelial 0-5 (A) NONE SEEN    Comment: Performed at [facility], [address], [place], [place] [address]  Culture, Urine     Status: None   Collection Time: 09/18/18  1:46 PM  Result Value Ref Range   Specimen Description URINE, CATHETERIZED    Special Requests NONE    Culture      NO GROWTH Performed at [facility], [place] [address], [place], [place] [address]    Report Status 09/19/2018 FINAL   Basic metabolic panel     Status: Abnormal   Collection Time: 09/19/18  4:53 AM  Result Value Ref Range   Sodium 141 135 - 145 mmol/L   Potassium 3.9 3.5 - 5.1 mmol/L   Chloride 113 (H) 98 - 111 mmol/L   CO2 19 (L) 22 - 32 mmol/L   Glucose, Bld 110 (H) 70 - 99 mg/dL   BUN 27 (H) 8 - 23 mg/dL   Creatinine, Ser 1.20 (H) 0.44 - 1.00 mg/dL   Calcium 8.3 (L) 8.9 - 10.3 mg/dL   GFR calc non Af Amer 38 (L) >60 mL/min   GFR calc Af Amer 44 (L) >60 mL/min    Comment: (NOTE) The eGFR has been calculated using the CKD EPI equation. This calculation has not been validated in all clinical situations. eGFR's persistently <60 mL/min signify possible Chronic Kidney Disease.    Anion gap 9 5 - 15    Comment: Performed at [facility] [address], [place], [place] [address]  Magnesium     Status: Abnormal   Collection Time: 09/19/18  4:53 AM  Result Value Ref Range   Magnesium 1.3 (L) 1.7 - 2.4 mg/dL    Comment: Performed at [facility] [address] [place], [place] [address]  CBC with Differential/Platelet     Status: Abnormal   Collection Time: 09/19/18  4:53 AM  Result Value Ref Range   WBC 3.6 (L) 4.0 - 10.5 K/uL   RBC  2.64 (L) 3.87 - 5.11 MIL/uL   Hemoglobin 7.3 (L) 12.0 - 15.0 g/dL  HCT 23.8 (L) 36.0 - 46.0 %   MCV 90.2 78.0 - 100.0 fL   MCH 27.7 26.0 - 34.0 pg   MCHC 30.7 30.0 - 36.0 g/dL   RDW 17.4 (H) 11.5 - 15.5 %   Platelets 98 (L) 150 - 400 K/uL    Comment: PLATELET COUNT CONFIRMED BY SMEAR   Neutrophils Relative % 76 %   Lymphocytes Relative 15 %   Monocytes Relative 6 %   Eosinophils Relative 1 %   Basophils Relative 2 %   Neutro Abs 2.8 1.7 - 7.7 K/uL   Lymphs Abs 0.5 (L) 0.7 - 4.0 K/uL   Monocytes Absolute 0.2 0.1 - 1.0 K/uL   Eosinophils Absolute 0.0 0.0 - 0.7 K/uL   Basophils Absolute 0.1 0.0 - 0.1 K/uL   RBC Morphology BURR CELLS    WBC Morphology DOHLE BODIES     Comment: HYPERSEGMENTED NEUT Performed at [facility] [address] [place], [place] [address]   MRSA PCR Screening     Status: None   Collection Time: 09/19/18  1:34 PM  Result Value Ref Range   MRSA by PCR NEGATIVE NEGATIVE    Comment:        The GeneXpert MRSA Assay (FDA approved for NASAL specimens only), is one component of a comprehensive MRSA colonization surveillance program. It is not intended to diagnose MRSA infection nor to guide or monitor treatment for MRSA infections. Performed at [facility], [place] [address], [place], [place] [address]   Prepare RBC     Status: None   Collection Time: 09/19/18  4:55 PM  Result Value Ref Range   Order Confirmation      ORDER PROCESSED BY BLOOD BANK Performed at [facility], [place] [address], [place], [place] [address]   CBC with Differential/Platelet     Status: Abnormal   Collection Time: 09/20/18  7:06 AM  Result Value Ref Range   WBC 2.1 (L) 4.0 - 10.5 K/uL   RBC 3.08 (L) 3.87 - 5.11 MIL/uL   Hemoglobin 8.7 (L) 12.0 - 15.0 g/dL   HCT 27.5 (L) 36.0 - 46.0 %   MCV 89.3 78.0 - 100.0 fL   MCH 28.2 26.0 - 34.0 pg   MCHC 31.6 30.0 - 36.0 g/dL   RDW 17.0 (H) 11.5 - 15.5 %   Platelets 117 (L) 150 - 400 K/uL   Neutrophils Relative %  77 %   Lymphocytes Relative 14 %   Monocytes Relative 6 %   Eosinophils Relative 1 %   Basophils Relative 2 %   Neutro Abs 1.7 1.7 - 7.7 K/uL   Lymphs Abs 0.3 (L) 0.7 - 4.0 K/uL   Monocytes Absolute 0.1 0.1 - 1.0 K/uL   Eosinophils Absolute 0.0 0.0 - 0.7 K/uL   Basophils Absolute 0.0 0.0 - 0.1 K/uL   RBC Morphology Acanthocytes present    WBC Morphology TOXIC GRANULATION     Comment: DOHLE BODIES Performed at [facility] [address] [place],  [address]   Comprehensive metabolic panel     Status: Abnormal   Collection Time: 09/20/18  7:06 AM  Result Value Ref Range   Sodium 140 135 - 145 mmol/L   Potassium 3.4 (L) 3.5 - 5.1 mmol/L   Chloride 111 98 - 111 mmol/L   CO2 21 (L) 22 - 32 mmol/L   Glucose, Bld 125 (H) 70 - 99 mg/dL   BUN 26 (H) 8 - 23 mg/dL  Creatinine, Ser 1.11 (H) 0.44 - 1.00 mg/dL   Calcium 8.6 (L) 8.9 - 10.3 mg/dL   Total Protein 4.9 (L) 6.5 - 8.1 g/dL   Albumin 2.2 (L) 3.5 - 5.0 g/dL   AST 17 15 - 41 U/L   ALT 31 0 - 44 U/L   Alkaline Phosphatase 72 38 - 126 U/L   Total Bilirubin 1.1 0.3 - 1.2 mg/dL   GFR calc non Af Amer 42 (L) >60 mL/min   GFR calc Af Amer 48 (L) >60 mL/min    Comment: (NOTE) The eGFR has been calculated using the CKD EPI equation. This calculation has not been validated in all clinical situations. eGFR's persistently <60 mL/min signify possible Chronic Kidney Disease.    Anion gap 8 5 - 15    Comment: Performed at [facility] [address], [place], [place] [address]  Magnesium     Status: Abnormal   Collection Time: 09/20/18  7:06 AM  Result Value Ref Range   Magnesium 1.6 (L) 1.7 - 2.4 mg/dL    Comment: Performed at [facility] [address], [place], [place] [address]   Ct Abdomen Pelvis Wo Contrast  Result Date: 09/20/2018 CLINICAL DATA:  84 y/o F; suspected abscess of the anal and rectal region. EXAM: CT ABDOMEN AND PELVIS WITHOUT CONTRAST TECHNIQUE: Multidetector CT imaging of the abdomen  and pelvis was performed following the standard protocol without IV contrast. COMPARISON:  09/16/2018 CT abdomen and pelvis. FINDINGS: Lower chest: Small bilateral pleural effusions. Hepatobiliary: No focal liver abnormality is seen. No gallstones, gallbladder wall thickening, or biliary dilatation. Pancreas: Unremarkable. No pancreatic ductal dilatation or surrounding inflammatory changes. Spleen: Spleen measures 12.6 x 6.7 x 10.2 cm (volume = 450 cm^3). Adrenals/Urinary Tract: Stable mild adrenal thickening without mass. No hydronephrosis or urinary stone disease. Left kidney lower pole 27 mm cyst with peripheral calcification. Right kidney upper pole 15 mm cyst with peripheral calcifications. Normal bladder. Stomach/Bowel: Stomach is within normal limits. Appendix not identified. No obstructive or inflammatory changes of the small bowel. Mild stable wall thickening of sigmoid colon diverticulosis, wall thickening, and surrounding pericholecystic edema. No perforation or abscess. Vascular/Lymphatic: Aortic atherosclerosis. No enlarged abdominal or pelvic lymph nodes. Reproductive: Status post hysterectomy. No adnexal masses. Other: Stable small volume of fluid in the pelvis. Musculoskeletal: No fracture is seen. Stable edema within the right gluteal cleft. No discrete fluid collection is identified. Moderate lumbar levocurvature with apex at L2. No acute osseous abnormality identified. IMPRESSION: 1. Stable inflammation within the right gluteal cleft. No discrete fluid collection identified. 2. Stable findings suspicious for acute sigmoid diverticulitis. 3. New small bilateral pleural effusions. 4. Stable mild splenomegaly. 5. Aortic Atherosclerosis (ICD10-I70.0). Electronically Signed   By: Kristine Garbe M.D.   On: 09/20/2018 03:10    Review Of Systems Constitutional: Positive fever, chills, no weight loss or gain. Eyes: No vision change, wears glasses. No discharge or pain. Ears: No hearing  loss, No tinnitus. Respiratory: No asthma, COPD, pneumonias. Positive shortness of breath. No hemoptysis. Cardiovascular: No chest pain, positive palpitation, leg edema. Gastrointestinal: No nausea, vomiting, diarrhea, constipation. No GI bleed. No hepatitis. Genitourinary: Positive dysuria, no hematuria, kidney stone. No incontinance. Neurological: No headache, positive stroke, seizures.  Psychiatry: No psych facility admission for anxiety, depression, suicide. No detox. Skin: No rash. Musculoskeletal: Positive joint pain, no fibromyalgia. No neck pain, back pain. Lymphadenopathy: No lymphadenopathy. Hematology: No anemia or easy bruising.   Blood pressure (!) 120/53, pulse 92, temperature  97.8 F (36.6 C), temperature source Axillary, resp. rate (!) 23, weight 74.5 kg, SpO2 93 %. Body mass index is 33.17 kg/m. General appearance: awake, cooperative, appears stated age and mild respiratory distress Head: Normocephalic, atraumatic. Eyes: Hazel eyes, pink conjunctiva, corneas clear. PERRL, EOM's intact. Neck: No adenopathy, no carotid bruit, no JVD, supple, symmetrical, trachea midline and thyroid not enlarged. Resp: Basal crackles to auscultation bilaterally. Cardio: Rapid Irregular rate and rhythm, S1, S2 normal, III/VI systolic murmur, no click, rub or gallop GI: Soft, non-tender; bowel sounds normal; no organomegaly. Extremities: No edema, cyanosis or clubbing. Skin: Warm and dry. Right gluteal erythema. Neurologic: Alert and oriented X 2, normal strength. Normal coordination.  Assessment/Plan Atrial fibrillation with RVR, CHA2DS2VASc score of 6 Right gluteal cellulitis Fever Hypertension Hyperlipidemia Hypothyroidism Anemia, normocytic, normochromic Myelodysplasia Possible volume overload R/o CHF Hypokalemia, resolving UTI, resolving  Plan:  Agree with Echocardiogram. Agree with gentle diuresis TSH was normal 1 month ago. Antibiotic treatment per  attending. Appreciate surgical consult. Will start diltiazem drip.  Add B-blocker or digoxin for heart rate control if needed. Birdie Riddle, MD  09/20/2018, 1:18 PM

## 2018-09-21 ENCOUNTER — Inpatient Hospital Stay (HOSPITAL_COMMUNITY): Payer: Medicare Other | Admitting: Anesthesiology

## 2018-09-21 ENCOUNTER — Encounter (HOSPITAL_COMMUNITY): Payer: Self-pay | Admitting: Orthopedic Surgery

## 2018-09-21 ENCOUNTER — Encounter (HOSPITAL_COMMUNITY)
Admission: EM | Disposition: A | Discharge status: Skilled Nursing Facility | Payer: Self-pay | Source: Skilled Nursing Facility | Attending: Internal Medicine

## 2018-09-21 ENCOUNTER — Other Ambulatory Visit (HOSPITAL_COMMUNITY): Payer: Medicare Other

## 2018-09-21 DIAGNOSIS — N179 Acute kidney failure, unspecified: Secondary | ICD-10-CM

## 2018-09-21 DIAGNOSIS — F0390 Unspecified dementia without behavioral disturbance: Secondary | ICD-10-CM

## 2018-09-21 DIAGNOSIS — Z6831 Body mass index (BMI) 31.0-31.9, adult: Secondary | ICD-10-CM

## 2018-09-21 DIAGNOSIS — Z880 Allergy status to penicillin: Secondary | ICD-10-CM

## 2018-09-21 DIAGNOSIS — R6520 Severe sepsis without septic shock: Secondary | ICD-10-CM

## 2018-09-21 DIAGNOSIS — E440 Moderate protein-calorie malnutrition: Secondary | ICD-10-CM

## 2018-09-21 DIAGNOSIS — A419 Sepsis, unspecified organism: Secondary | ICD-10-CM

## 2018-09-21 DIAGNOSIS — K6289 Other specified diseases of anus and rectum: Secondary | ICD-10-CM

## 2018-09-21 DIAGNOSIS — R509 Fever, unspecified: Secondary | ICD-10-CM

## 2018-09-21 DIAGNOSIS — D469 Myelodysplastic syndrome, unspecified: Secondary | ICD-10-CM

## 2018-09-21 DIAGNOSIS — Z515 Encounter for palliative care: Secondary | ICD-10-CM

## 2018-09-21 DIAGNOSIS — L03317 Cellulitis of buttock: Secondary | ICD-10-CM

## 2018-09-21 DIAGNOSIS — N184 Chronic kidney disease, stage 4 (severe): Secondary | ICD-10-CM

## 2018-09-21 DIAGNOSIS — Z888 Allergy status to other drugs, medicaments and biological substances status: Secondary | ICD-10-CM

## 2018-09-21 HISTORY — PX: INCISION AND DRAINAGE PERIRECTAL ABSCESS: SHX1804

## 2018-09-21 LAB — TYPE AND SCREEN
ABO/RH(D): O POS
Antibody Screen: NEGATIVE
Unit division: 0
Unit division: 0

## 2018-09-21 LAB — COMPREHENSIVE METABOLIC PANEL
ALBUMIN: 2.2 g/dL — AB (ref 3.5–5.0)
ALT: 25 U/L (ref 0–44)
AST: 20 U/L (ref 15–41)
Alkaline Phosphatase: 64 U/L (ref 38–126)
Anion gap: 10 (ref 5–15)
BUN: 32 mg/dL — AB (ref 8–23)
CO2: 23 mmol/L (ref 22–32)
Calcium: 8.5 mg/dL — ABNORMAL LOW (ref 8.9–10.3)
Chloride: 109 mmol/L (ref 98–111)
Creatinine, Ser: 1.22 mg/dL — ABNORMAL HIGH (ref 0.44–1.00)
GFR calc Af Amer: 43 mL/min — ABNORMAL LOW (ref >60)
GFR, EST NON AFRICAN AMERICAN: 37 mL/min — AB (ref >60)
GLUCOSE: 95 mg/dL (ref 70–99)
POTASSIUM: 4.3 mmol/L (ref 3.5–5.1)
Sodium: 142 mmol/L (ref 135–145)
Total Bilirubin: 0.6 mg/dL (ref 0.3–1.2)
Total Protein: 5.2 g/dL — ABNORMAL LOW (ref 6.5–8.1)

## 2018-09-21 LAB — CBC
HEMATOCRIT: 30.8 % — AB (ref 36.0–46.0)
Hemoglobin: 9.9 g/dL — ABNORMAL LOW (ref 12.0–15.0)
MCH: 28.6 pg (ref 26.0–34.0)
MCHC: 32.1 g/dL (ref 30.0–36.0)
MCV: 89 fL (ref 78.0–100.0)
PLATELETS: 99 10*3/uL — AB (ref 150–400)
RBC: 3.46 MIL/uL — AB (ref 3.87–5.11)
RDW: 17.2 % — ABNORMAL HIGH (ref 11.5–15.5)
WBC: 2.6 10*3/uL — ABNORMAL LOW (ref 4.0–10.5)

## 2018-09-21 LAB — BPAM RBC
Blood Product Expiration Date: 201910112359
Blood Product Expiration Date: 201911042359
ISSUE DATE / TIME: 201910041117
ISSUE DATE / TIME: 201910060038
Unit Type and Rh: 5100
Unit Type and Rh: 5100

## 2018-09-21 LAB — SURGICAL PCR SCREEN
MRSA, PCR: NEGATIVE
Staphylococcus aureus: NEGATIVE

## 2018-09-21 LAB — CULTURE, BLOOD (ROUTINE X 2)
Culture: NO GROWTH
Culture: NO GROWTH
SPECIAL REQUESTS: ADEQUATE

## 2018-09-21 LAB — MAGNESIUM: MAGNESIUM: 1.8 mg/dL (ref 1.7–2.4)

## 2018-09-21 LAB — SAVE SMEAR

## 2018-09-21 SURGERY — INCISION AND DRAINAGE, ABSCESS, PERIRECTAL
Anesthesia: General | Site: Buttocks

## 2018-09-21 MED ORDER — LACTATED RINGERS IV SOLN
INTRAVENOUS | Status: DC
  Administered 2018-09-21 – 2018-09-25 (×4): via INTRAVENOUS

## 2018-09-21 MED ORDER — MEPERIDINE HCL 50 MG/ML IJ SOLN: 6.2500 mg | INTRAMUSCULAR | Status: DC | PRN

## 2018-09-21 MED ORDER — DILTIAZEM HCL-DEXTROSE 100-5 MG/100ML-% IV SOLN (PREMIX)
2.5000 mg/h | INTRAVENOUS | Status: DC
  Filled 2018-09-21: qty 100

## 2018-09-21 MED ORDER — TRAMADOL HCL 50 MG PO TABS
50.0000 mg | ORAL_TABLET | Freq: Four times a day (QID) | ORAL | Status: DC | PRN
  Administered 2018-09-21: 50 mg via ORAL
  Filled 2018-09-21: qty 1

## 2018-09-21 MED ORDER — LIDOCAINE 2% (20 MG/ML) 5 ML SYRINGE
INTRAMUSCULAR | Status: AC
  Filled 2018-09-21: qty 5

## 2018-09-21 MED ORDER — PROPOFOL 10 MG/ML IV BOLUS
INTRAVENOUS | Status: AC
  Filled 2018-09-21: qty 20

## 2018-09-21 MED ORDER — DILTIAZEM HCL 60 MG PO TABS
60.0000 mg | ORAL_TABLET | Freq: Three times a day (TID) | ORAL | Status: DC
  Administered 2018-09-21 – 2018-09-24 (×9): 60 mg via ORAL
  Filled 2018-09-21 (×10): qty 1

## 2018-09-21 MED ORDER — LIDOCAINE HCL 1 % IJ SOLN
INTRAMUSCULAR | Status: DC | PRN
  Administered 2018-09-21: 14:00:00 via INTRAMUSCULAR

## 2018-09-21 MED ORDER — SUCCINYLCHOLINE CHLORIDE 200 MG/10ML IV SOSY
PREFILLED_SYRINGE | INTRAVENOUS | Status: AC
  Filled 2018-09-21: qty 10

## 2018-09-21 MED ORDER — PROPOFOL 10 MG/ML IV BOLUS
INTRAVENOUS | Status: DC | PRN
  Administered 2018-09-21 (×2): 20 mg via INTRAVENOUS

## 2018-09-21 MED ORDER — FENTANYL CITRATE (PF) 100 MCG/2ML IJ SOLN: 25.0000 ug | INTRAMUSCULAR | Status: DC | PRN

## 2018-09-21 MED ORDER — FENTANYL CITRATE (PF) 100 MCG/2ML IJ SOLN
INTRAMUSCULAR | Status: DC | PRN
  Administered 2018-09-21: 50 ug via INTRAVENOUS

## 2018-09-21 MED ORDER — FENTANYL CITRATE (PF) 250 MCG/5ML IJ SOLN
INTRAMUSCULAR | Status: AC
  Filled 2018-09-21: qty 5

## 2018-09-21 MED ORDER — LACTATED RINGERS IV SOLN
INTRAVENOUS | Status: DC
  Administered 2018-09-21 (×2): via INTRAVENOUS

## 2018-09-21 MED ORDER — LIDOCAINE HCL (CARDIAC) PF 100 MG/5ML IV SOSY
PREFILLED_SYRINGE | INTRAVENOUS | Status: DC | PRN
  Administered 2018-09-21: 30 mg via INTRAVENOUS

## 2018-09-21 MED ORDER — DIGOXIN 0.25 MG/ML IJ SOLN: 0.2500 mg | Freq: Once | INTRAMUSCULAR | Status: DC

## 2018-09-21 MED ORDER — BISACODYL 5 MG PO TBEC: 10.0000 mg | DELAYED_RELEASE_TABLET | Freq: Once | ORAL | Status: DC

## 2018-09-21 MED ORDER — ACETAMINOPHEN 500 MG PO TABS
1000.0000 mg | ORAL_TABLET | Freq: Three times a day (TID) | ORAL | Status: DC
  Administered 2018-09-21 – 2018-09-23 (×5): 1000 mg via ORAL
  Filled 2018-09-21 (×5): qty 2

## 2018-09-21 MED ORDER — BUPIVACAINE-EPINEPHRINE 0.25% -1:200000 IJ SOLN
INTRAMUSCULAR | Status: AC
  Filled 2018-09-21: qty 1

## 2018-09-21 MED ORDER — 0.9 % SODIUM CHLORIDE (POUR BTL) OPTIME
TOPICAL | Status: DC | PRN
  Administered 2018-09-21: 1000 mL

## 2018-09-21 MED ORDER — METOPROLOL TARTRATE 25 MG PO TABS
25.0000 mg | ORAL_TABLET | Freq: Two times a day (BID) | ORAL | Status: DC
  Administered 2018-09-21 – 2018-09-25 (×9): 25 mg via ORAL
  Filled 2018-09-21 (×9): qty 1

## 2018-09-21 MED ORDER — LIDOCAINE HCL (PF) 1 % IJ SOLN
INTRAMUSCULAR | Status: AC
  Filled 2018-09-21: qty 30

## 2018-09-21 SURGICAL SUPPLY — 39 items
BRIEF STRETCH FOR OB PAD LRG (UNDERPADS AND DIAPERS) ×2 IMPLANT
CANISTER SUCT 3000ML PPV (MISCELLANEOUS) IMPLANT
COVER SURGICAL LIGHT HANDLE (MISCELLANEOUS) ×2 IMPLANT
COVER WAND RF STERILE (DRAPES) IMPLANT
DRAPE LAPAROSCOPIC ABDOMINAL (DRAPES) ×2 IMPLANT
DRSG PAD ABDOMINAL 8X10 ST (GAUZE/BANDAGES/DRESSINGS) ×2 IMPLANT
ELECT CAUTERY BLADE 6.4 (BLADE) ×2 IMPLANT
ELECT REM PT RETURN 9FT ADLT (ELECTROSURGICAL) ×2
ELECTRODE REM PT RTRN 9FT ADLT (ELECTROSURGICAL) ×1 IMPLANT
GAUZE PACKING IODOFORM 1X5 (MISCELLANEOUS) IMPLANT
GAUZE SPONGE 4X4 12PLY STRL (GAUZE/BANDAGES/DRESSINGS) ×2 IMPLANT
GAUZE SPONGE 4X4 12PLY STRL LF (GAUZE/BANDAGES/DRESSINGS) ×2 IMPLANT
GLOVE BIO SURGEON STRL SZ7 (GLOVE) ×2 IMPLANT
GLOVE BIOGEL PI IND STRL 7.5 (GLOVE) ×1 IMPLANT
GLOVE BIOGEL PI INDICATOR 7.5 (GLOVE) ×1
GOWN STRL REUS W/ TWL LRG LVL3 (GOWN DISPOSABLE) ×2 IMPLANT
GOWN STRL REUS W/TWL LRG LVL3 (GOWN DISPOSABLE) ×2
KIT BASIN OR (CUSTOM PROCEDURE TRAY) ×2 IMPLANT
KIT TURNOVER KIT B (KITS) ×2 IMPLANT
NEEDLE HYPO 25GX1X1/2 BEV (NEEDLE) ×2 IMPLANT
NS IRRIG 1000ML POUR BTL (IV SOLUTION) ×2 IMPLANT
PACK GENERAL/GYN (CUSTOM PROCEDURE TRAY) ×2 IMPLANT
PACK LITHOTOMY IV (CUSTOM PROCEDURE TRAY) IMPLANT
PACK SURGICAL SETUP 50X90 (CUSTOM PROCEDURE TRAY) IMPLANT
PAD ABD 8X10 STRL (GAUZE/BANDAGES/DRESSINGS) ×2 IMPLANT
PAD ARMBOARD 7.5X6 YLW CONV (MISCELLANEOUS) ×4 IMPLANT
PENCIL BUTTON HOLSTER BLD 10FT (ELECTRODE) ×2 IMPLANT
SOL PREP POV-IOD 4OZ 10% (MISCELLANEOUS) ×2 IMPLANT
SPONGE LAP 18X18 X RAY DECT (DISPOSABLE) IMPLANT
SURGILUBE 2OZ TUBE FLIPTOP (MISCELLANEOUS) IMPLANT
SWAB COLLECTION DEVICE MRSA (MISCELLANEOUS) ×2 IMPLANT
SWAB CULTURE ESWAB REG 1ML (MISCELLANEOUS) ×2 IMPLANT
SYR BULB 3OZ (MISCELLANEOUS) IMPLANT
SYR CONTROL 10ML LL (SYRINGE) ×2 IMPLANT
TOWEL OR 17X24 6PK STRL BLUE (TOWEL DISPOSABLE) ×2 IMPLANT
TOWEL OR 17X26 10 PK STRL BLUE (TOWEL DISPOSABLE) IMPLANT
TUBE CONNECTING 12X1/4 (SUCTIONS) IMPLANT
UNDERPAD 30X30 (UNDERPADS AND DIAPERS) IMPLANT
YANKAUER SUCT BULB TIP NO VENT (SUCTIONS) IMPLANT

## 2018-09-21 NOTE — Progress Notes (Signed)
CSW continuing to follow to support with disposition planning. Noted plan for palliative meeting with family tomorrow.   Estanislado Emms, [place]

## 2018-09-21 NOTE — Anesthesia Preprocedure Evaluation (Addendum)
Anesthesia Evaluation  Patient identified by MRN, date of birth, ID band Patient awake    Reviewed: Allergy & Precautions, H&P , NPO status , Patient's Chart, lab work & pertinent test results, reviewed documented beta blocker date and time   Airway Mallampati: II  TM Distance: >3 FB Neck ROM: full    Dental no notable dental hx.    Pulmonary neg pulmonary ROS,    Pulmonary exam normal breath sounds clear to auscultation       Cardiovascular Exercise Tolerance: Good hypertension, Pt. on medications negative cardio ROS   Rhythm:regular Rate:Normal     Neuro/Psych PSYCHIATRIC DISORDERS Dementia CVA    GI/Hepatic negative GI ROS, Neg liver ROS,   Endo/Other  Hypothyroidism   Renal/GU CRF and ARFRenal disease  negative genitourinary   Musculoskeletal  (+) Arthritis , Osteoarthritis,    Abdominal   Peds  Hematology negative hematology ROS (+) anemia ,   Anesthesia Other Findings   Reproductive/Obstetrics negative OB ROS                             Anesthesia Physical Anesthesia Plan  ASA: IV and emergent  Anesthesia Plan: General   Post-op Pain Management:    Induction: Intravenous  PONV Risk Score and Plan: 3 and Ondansetron, Treatment may vary due to age or medical condition and Dexamethasone  Airway Management Planned: Oral ETT  Additional Equipment:   Intra-op Plan:   Post-operative Plan: Extubation in OR  Informed Consent: I have reviewed the patients History and Physical, chart, labs and discussed the procedure including the risks, benefits and alternatives for the proposed anesthesia with the patient or authorized representative who has indicated his/her understanding and acceptance.   Dental Advisory Given  Plan Discussed with: CRNA, Anesthesiologist and Surgeon  Anesthesia Plan Comments: (  I had a long discussion with the patients son and went over options for  resuscitation in the event that it is needed.  The son feels that it would be appropriate to ventilate and manage airway and give cardiac drugs but feels that we should hold off on chest compressions.  )      Anesthesia Quick Evaluation

## 2018-09-21 NOTE — Progress Notes (Signed)
PT Cancellation Note  Patient Details Name: Brit Carbonell MRN: [id number] DOB: [birth year]   Cancelled Treatment:    Reason Eval/Treat Not Completed: Patient at procedure or test/unavailable Patient off of unit for procedure. Will follow.   Lanney Gins, PT, DPT Supplemental Physical Therapist 09/21/18 1:55 PM Pager: [phone] Office: [phone]

## 2018-09-21 NOTE — Progress Notes (Signed)
Occupational Therapy Treatment Patient Details Name: Brittany [name] MRN: [id number] DOB: 27-Jan-1924 Today's Date: 09/21/2018    History of present illness PAtient is a 84 y/o female with h/o dementia, myelodysplastic syndrome, shoulder pain, DJD, melanoma, admitted with fever and treated for UTI and aspiration pna.  She was hospitalized 8/30-08/18/18 with sepsis due to aspiration PNA and UTI with Enterobacter and E. Coli bacteremia. 09/21/18 I&D perirectal abscess    OT comments  Pt fatigues quickly and requires total +2 mod (A) to pivot back to bed from [facility]. Pt noted to have incontinence of bladder with diaper and lack of awareness to change diaper. New diaper don per family request. Noted that diaper is pulling into gluteal folds and could be a source of friction. Diaper torn at the lateral aspects to position properly and decr risk for pressure on sacral wound. Son present for education. Recommend WOC consult to address risk and discussion with family. Pt noted to have two pads on bed surface and family educated that one pad being removed to decr friction and risk for skin break down. Pt could benefit from air mattress s/p I&D.    Follow Up Recommendations  SNF    Equipment Recommendations  None recommended by OT    Recommendations for Other Services Other (comment)(palliative consult)    Precautions / Restrictions Precautions Precautions: Fall Precaution Comments: recent falls at facility        Mobility Bed Mobility Overal bed mobility: Needs Assistance Bed Mobility: Rolling;Supine to Sit;Sit to Supine Rolling: Mod assist   Supine to sit: Mod assist Sit to supine: Mod assist;+2 for physical assistance   General bed mobility comments: pt requires (A) to elevate from bed surface and pad used to total pivot to EOB. pt requires total+2 (A) to descend to bed surface. pt (A) to control trunk and total (A) to elevate bil LE onto bed surface  Transfers Overall transfer level: Needs  assistance Equipment used: 1 person hand held assist   Sit to Stand: +2 physical assistance;Mod assist Stand pivot transfers: +2 physical assistance;Mod assist            Balance Overall balance assessment: Needs assistance Sitting-balance support: Bilateral upper extremity supported;Feet supported Sitting balance-Leahy Scale: Poor     Standing balance support: Bilateral upper extremity supported;During functional activity Standing balance-Leahy Scale: Zero                             ADL either performed or assessed with clinical judgement   ADL Overall ADL's : Needs assistance/impaired                         Toilet Transfer: +2 for physical assistance;Moderate assistance;Stand-pivot Toilet Transfer Details (indicate cue type and reason): pt unable to sustain static standing. pt attempting to sit prematurely. pt requires mod (A) to [facility] and total +2 to return to bed level. pt relies totally on therapist (A) to power up and to pivot Toileting- Clothing Manipulation and Hygiene: Total assistance         General ADL Comments: pt requires (A) for all transfers and moaning throughout session.      Vision       Perception     Praxis      Cognition Arousal/Alertness: Awake/alert Behavior During Therapy: Flat affect Overall Cognitive Status: History of cognitive impairments - at baseline  General Comments: pt following asleep once initially and needed arousal for participation again. Pt sustained arousal once upright due to back and buttock discomfort        Exercises     Shoulder Instructions       General Comments      Pertinent Vitals/ Pain       Pain Assessment: Faces Faces Pain Scale: Hurts even more Pain Location: generalized.  Moans when touched anywhere  Pain Descriptors / Indicators: Grimacing;Moaning Pain Intervention(s): Monitored during session;Premedicated before  session;Repositioned  Home Living                                          Prior Functioning/Environment              Frequency  Min 2X/week        Progress Toward Goals  OT Goals(current goals can now be found in the care plan section)  Progress towards OT goals: Progressing toward goals  Acute Rehab OT Goals Patient Stated Goal: none stated by patient. family expressed need for different seating options for OOB OT Goal Formulation: With patient/family Time For Goal Achievement: 10/03/18 Potential to Achieve Goals: Good ADL Goals Pt Will Perform Eating: with set-up;with supervision;sitting Pt Will Perform Grooming: with mod assist;standing Pt Will Transfer to Toilet: with mod assist;ambulating;regular height toilet;bedside commode;grab bars Pt Will Perform Toileting - Clothing Manipulation and hygiene: with mod assist;sit to/from stand  Plan Discharge plan remains appropriate    Co-evaluation                 AM-PAC PT "6 Clicks" Daily Activity     Outcome Measure   Help from another person eating meals?: A Lot Help from another person taking care of personal grooming?: A Lot Help from another person toileting, which includes using toliet, bedpan, or urinal?: A Lot Help from another person bathing (including washing, rinsing, drying)?: A Lot Help from another person to put on and taking off regular upper body clothing?: A Lot Help from another person to put on and taking off regular lower body clothing?: Total 6 Click Score: 11    End of Session Equipment Utilized During Treatment: Oxygen  OT Visit Diagnosis: Unsteadiness on feet (R26.81)   Activity Tolerance Patient limited by pain   Patient Left in bed;with call bell/phone within reach;with family/visitor present   Nurse Communication Mobility status;Precautions        Time: 8003-4917 OT Time Calculation (min): 25 min  Charges: OT General Charges $OT Visit: 1 Visit OT  Treatments $Self Care/Home Management : 23-37 mins   Jeri Modena, OTR/L  Acute Rehabilitation Services Pager: [phone] Office: [phone] .    Parke Poisson B 09/21/2018, 2:15 PM

## 2018-09-21 NOTE — Consult Note (Signed)
Consultation Note Date: 09/21/2018   Patient Name: Brittany [name]  DOB: 1924-10-02  MRN: [id number]  Age / Sex: 84 y.o., female  PCP: Blanchie Serve, MD Referring Physician: Florencia Reasons, MD  Reason for Consultation: Establishing goals of care and Psychosocial/spiritual support  HPI/Patient Profile: 84 y.o. female  with past medical history of CVA, vascular dementia, myelodysplasia, recurrent UTI, and recent bacteremia who was admitted from [facility] on 09/16/2018 with sepsis.  Work up revealed cellulitis of the gluteal cleft which could have caused the sepsis.  Plan is for I&D today.  Hospitalization has been complicated by afib with RVR, and a drop in hgb to 6.4.   Palliative Medicine consultation was requested by the family in order to address the patient's overall health picture and anticipatory needs.  Clinical Assessment and Goals of Care:  I have reviewed medical records including EPIC notes, labs and imaging, received report from Dr. Erlinda Hong, assessed the patient and then met at the bedside along with her son Antony Haste.  The patient was in pre-op preparing for I&D.  Antony Haste introduced himself as her son and HCPOA.  He then expressed the desire to have multiple family members in our meeting.  We decided to have a preliminary meeting tomorrow and a larger family meeting perhaps on Wednesday.    SUMMARY OF RECOMMENDATIONS    PMT will schedule GOC with extended family for Wednesday 10/9  Code Status/Advance Care Planning:  DNR   Symptom Management:   Per primary team  Additional Recommendations (Limitations, Scope, Preferences):  Full Scope Treatment   Prognosis:  To be determined.   Discharge Planning: To Be Determined      Primary Diagnoses: Present on Admission: . AKI (acute kidney injury) ([place]) . CKD (chronic kidney disease) stage 3, GFR 30-59 ml/min ([facility]) . Essential hypertension .  Hypothyroidism . Myelodysplastic syndrome ([place]) . Vascular dementia without behavioral disturbance ([place]) . Diverticulitis . Fever   I have reviewed the medical record, interviewed the patient and family, and examined the patient. The following aspects are pertinent.  Past Medical History:  Diagnosis Date  . Blood transfusion 2006  . Colon polyps   . CVA (cerebral infarction) 2013  . Dementia ([place])   . Diverticulosis of colon   . DJD (degenerative joint disease)   . Hyperlipidemia   . Hypertension   . Hypothyroidism   . Melanoma ([place]) 03/20/15   removed with Moh's surgery  . Myelodysplasia 02/04/2012  . Normochromic normocytic anemia 02/04/2012  . Onychomycosis   . Osteoporosis   . Rectal prolapse   . Shoulder pain   . Unstable gait    Social History   Socioeconomic History  . Marital status: Married    Spouse name: Not on file  . Number of children: Not on file  . Years of education: Not on file  . Highest education level: Not on file  Occupational History  . Occupation: English as a second language teacher  Social Needs  . Financial resource strain: Not hard at all  . Food insecurity:  Worry: Never true    Inability: Never true  . Transportation needs:    Medical: No    Non-medical: No  Tobacco Use  . Smoking status: Never Smoker  . Smokeless tobacco: Never Used  Substance and Sexual Activity  . Alcohol use: No  . Drug use: No  . Sexual activity: Yes    Birth control/protection: Surgical    Comment: HYST  Lifestyle  . Physical activity:    Days per week: 0 days    Minutes per session: 0 min  . Stress: Not on file  Relationships  . Social connections:    Talks on phone: Not on file    Gets together: Not on file    Attends religious service: Not on file    Active member of club or organization: Not on file    Attends meetings of clubs or organizations: Not on file    Relationship status: Not on file  Other Topics Concern  . Not on file  Social History Narrative   Lives  at [facility], moved to [place] 12/28/14   married   Never smoked   Alcohol none   Caffeine Coffee 1 cup   Exercise none   Family History  Problem Relation Age of Onset  . Prostate cancer Brother   . Cancer Brother        renal cell carcinoma  . Heart disease Brother   . Breast cancer Mother 33  . Cancer Father        unknown kind  . Arthritis Daughter    Scheduled Meds: . [MAR Hold] sodium chloride   Intravenous Once  . [MAR Hold] aspirin  81 mg Oral Daily  . [MAR Hold] cycloSPORINE  1 drop Both Eyes BID  . [MAR Hold] digoxin  0.25 mg Intravenous Once  . [MAR Hold] diltiazem  60 mg Oral Q8H  . [MAR Hold] donepezil  10 mg Oral QHS  . [MAR Hold] levothyroxine  88 mcg Oral Q24H  . [MAR Hold] metoprolol tartrate  25 mg Oral BID  . [MAR Hold] sodium chloride flush  3 mL Intravenous Q12H   Continuous Infusions: . lactated ringers 10 mL/hr at 09/21/18 1227  . [MAR Hold] meropenem (MERREM) IV 1 g (09/21/18 1042)   PRN Meds:.[MAR Hold] acetaminophen, [MAR Hold] albuterol, [MAR Hold] guaiFENesin, [MAR Hold] hydrALAZINE, [MAR Hold] ondansetron **OR** [MAR Hold] ondansetron (ZOFRAN) IV, [MAR Hold] polyvinyl alcohol Allergies  Allergen Reactions  . Lisinopril     REACTION: angioedema  . Penicillins     REACTION: rash   Review of Systems patient unable to provide.    Physical Exam  Well developed elderly female, sleepy, in pre op.   Son at bedside. CV rrr Resp mild to mod increased work of breathing on nasal canula. No w/c/r Abdomen soft, nt, nd  Vital Signs: BP (!) 137/115 (BP Location: Left Arm)   Pulse 97   Temp 98.5 F (36.9 C) (Oral)   Resp (!) 27   Ht 4' 11.02" (1.499 m)   Wt 75.5 kg   SpO2 96%   BMI 33.60 kg/m  Pain Scale: 0-10   Pain Score: Asleep   SpO2: SpO2: 96 % O2 Device:SpO2: 96 % O2 Flow Rate: .O2 Flow Rate (L/min): 2 L/min  IO: Intake/output summary:   Intake/Output Summary (Last 24 hours) at 09/21/2018 1346 Last data filed at 09/21/2018  0600 Gross per 24 hour  Intake 409.58 ml  Output -  Net 409.58 ml    LBM:  Last BM Date: 09/21/18 Baseline Weight: Weight: 69.6 kg Most recent weight: Weight: 75.5 kg     Palliative Assessment/Data:20%     Time In: 1:30 Time Out: 2:00 Time Total: 30 min. Greater than 50%  of this time was spent counseling and coordinating care related to the above assessment and plan.  Signed by: Florentina Jenny, PA-C Palliative Medicine Pager: [phone]  Please contact Palliative Medicine Team phone at [phone] for questions and concerns.  For individual provider: See Shea Evans

## 2018-09-21 NOTE — Progress Notes (Signed)
[facility] [facility] CONSULT NOTE  Patient Care Team: Blanchie Serve, MD as PCP - General (Internal Medicine) Milus Banister, MD as Consulting Physician (Gastroenterology) Heath Lark, MD as Consulting Physician (Hematology and Oncology) Mast, Man X, NP as Nurse Practitioner (Internal Medicine)  ASSESSMENT & PLAN:  Acute on chronic progressive pancytopenia, on background history of MDS This is likely due to recent infection/sepsis, side effects of antibiotics and decline in overall health She has received transfusion support She does not need platelet transfusion unless less than 10 or bleeding I have cancelled her appointment at the cancer center on September 24, 2018 due to hospitalization Darbepoetin injection does not work in the setting of infection/sepsis I will call the son next week to see if the family wants to continue treatment or not Last ANC drawn yesterday was adequate.  She does not need G-CSF support  Infection/sepsis, status post I&D The patient had multiple hospitalization for recurrent infection She is likely immunocompromised due to her age, comorbidities and others She appears to be responding to antibiotic therapy Last febrile episode was 6 PM yesterday Continue treatment per infectious disease team  Moderate protein calorie malnutrition In the setting of severe illness  Chronic kidney disease stage IV Monitor closely  Dementia with altered mental status Has not improved much since admission  Goals of care discussion I have extensive goals of care discussion with the patient's son The patient has recurrent admission to the hospital with significant major co-morbidities Palliative care consult is pending I recommend consideration for non-aggressive approach with transitioning future goals towards comfort measures I do not recommend chronic transfusion support, dialysis support or frequent blood count monitoring as it would not impact on her quality of  life  Discharge planning Will defer to primary service I will call the son next week for further follow-up to see if he is willing to stop all her future treatment at the cancer center  Heath Lark, MD 09/21/2018  CHIEF COMPLAINTS/PURPOSE OF CONSULTATION:  I was consulted by hospitalist to come and evaluate the patient with background history of MDS, severe anemia, for further plan of care  HISTORY OF PRESENTING ILLNESS:  Brittany [name] 84 y.o. female has been in the hospital since last week She was brought in from friends home with some fever, altered mental status, worrisome for recurrent pneumonia. She just had surgery 2 hours ago for incision and drainage of perirectal abscess I have reviewed her chart and materials related to her cancer extensively and collaborated history with the patient's son  Summary of oncologic history is as follows: This is a pleasant lady with a myeloproliferative disorder with dysplastic features. She was diagnosed in May of 2008 when she presented with unexplained normochromic anemia. Bone marrow aspiration and biopsy was done on 04/23/2007. Marrow was hypercellular for age 97-80% Maturation was normal in all 3 cell lines and there were only 1% blasts. Cytogenetics were normal including FISH studies to look at chromosome 5 and chromosome 7 deletions which were not present. Hemoglobin was 9.7 at the time with MCV 90, Greener count 4700, and platelets 162,000. There were no ringed sideroblasts.  She was initially followed with observation alone until hemoglobin fell to 8.6 by June of 2010. She was started on a trial of Aranesp and had a nice response with rise in hemoglobin up to 12.5 g. The patient is noted to have dementia. She has not received any injection for many months as her hemoglobin has been elevated at greater than 10  g. She is not symptomatic. Aranesp injection was discontinued on 10/04/2014 It was noted that her hemoglobin started to trend down again and  Aranesp injection is resumed on 08/27/2018  I am not able to get much history from the patient as she appears somewhat sedated.  Her son is present. He felt that her mental status has not improved much.  MEDICAL HISTORY:  Past Medical History:  Diagnosis Date  . Blood transfusion 2006  . Colon polyps   . CVA (cerebral infarction) 2013  . Dementia ([place])   . Diverticulosis of colon   . DJD (degenerative joint disease)   . Hyperlipidemia   . Hypertension   . Hypothyroidism   . Melanoma ([place]) 03/20/15   removed with Moh's surgery  . Myelodysplasia 02/04/2012  . Normochromic normocytic anemia 02/04/2012  . Onychomycosis   . Osteoporosis   . Rectal prolapse   . Shoulder pain   . Unstable gait     SURGICAL HISTORY: Past Surgical History:  Procedure Laterality Date  . APPENDECTOMY    . CARPAL TUNNEL RELEASE Bilateral 2006  . FLEXIBLE SIGMOIDOSCOPY  07/09/2012   Procedure: FLEXIBLE SIGMOIDOSCOPY;  Surgeon: Inda Castle, MD;  Location: [facility];  Service: Endoscopy;  Laterality: N/A;  . MELANOMA EXCISION Right 2005   arm  . POLYPECTOMY  2004   Laparoscopic  . RECTOCELE REPAIR    . REPLACEMENT TOTAL KNEE  2005 & 2006  . VAGINAL HYSTERECTOMY  1989    SOCIAL HISTORY: Social History   Socioeconomic History  . Marital status: Married    Spouse name: Not on file  . Number of children: Not on file  . Years of education: Not on file  . Highest education level: Not on file  Occupational History  . Occupation: English as a second language teacher  Social Needs  . Financial resource strain: Not hard at all  . Food insecurity:    Worry: Never true    Inability: Never true  . Transportation needs:    Medical: No    Non-medical: No  Tobacco Use  . Smoking status: Never Smoker  . Smokeless tobacco: Never Used  Substance and Sexual Activity  . Alcohol use: No  . Drug use: No  . Sexual activity: Yes    Birth control/protection: Surgical    Comment: HYST  Lifestyle  . Physical activity:     Days per week: 0 days    Minutes per session: 0 min  . Stress: Not on file  Relationships  . Social connections:    Talks on phone: Not on file    Gets together: Not on file    Attends religious service: Not on file    Active member of club or organization: Not on file    Attends meetings of clubs or organizations: Not on file    Relationship status: Not on file  . Intimate partner violence:    Fear of current or ex partner: Not on file    Emotionally abused: Not on file    Physically abused: Not on file    Forced sexual activity: Not on file  Other Topics Concern  . Not on file  Social History Narrative   L[facility] at [facility], moved to [place] 12/28/14   married   Never smoked   Alcohol none   Caffeine Coffee 1 cup   Exercise none    FAMILY HISTORY: Family History  Problem Relation Age of Onset  . Prostate cancer Brother   . Cancer Brother  renal cell carcinoma  . Heart disease Brother   . Breast cancer Mother 8  . Cancer Father        unknown kind  . Arthritis Daughter     ALLERGIES:  is allergic to lisinopril and penicillins.  MEDICATIONS:  Current Facility-Administered Medications  Medication Dose Route Frequency Provider Last Rate Last Dose  . 0.9 %  sodium chloride infusion (Manually program via Guardrails IV Fluids)   Intravenous Once Schorr, Rhetta Mura, NP      . acetaminophen (TYLENOL) suppository 650 mg  650 mg Rectal Q4H PRN Karmen Bongo, MD   650 mg at 09/20/18 1756  . albuterol (PROVENTIL) (2.5 MG/3ML) 0.083% nebulizer solution 2.5 mg  2.5 mg Nebulization Q6H PRN Pokhrel, Laxman, MD   2.5 mg at 09/18/18 1758  . aspirin chewable tablet 81 mg  81 mg Oral Daily Florencia Reasons, MD   81 mg at 09/21/18 1040  . cycloSPORINE (RESTASIS) 0.05 % ophthalmic emulsion 1 drop  1 drop Both Eyes BID Karmen Bongo, MD   1 drop at 09/20/18 2143  . digoxin (LANOXIN) 0.25 MG/ML injection 0.25 mg  0.25 mg Intravenous Once Dixie Dials, MD      . diltiazem  (CARDIZEM) tablet 60 mg  60 mg Oral Q8H Dixie Dials, MD   60 mg at 09/21/18 1039  . donepezil (ARICEPT) tablet 10 mg  10 mg Oral QHS Pokhrel, Laxman, MD   10 mg at 09/20/18 2143  . guaiFENesin (ROBITUSSIN) 100 MG/5ML solution 100 mg  5 mL Oral Q4H PRN Pokhrel, Laxman, MD   100 mg at 09/17/18 1352  . hydrALAZINE (APRESOLINE) injection 5 mg  5 mg Intravenous Q4H PRN Karmen Bongo, MD      . lactated ringers infusion   Intravenous Continuous Janeece Riggers, MD   Stopped at 09/21/18 1410  . levothyroxine (SYNTHROID, LEVOTHROID) tablet 88 mcg  88 mcg Oral Q24H Pokhrel, Laxman, MD   88 mcg at 09/21/18 0507  . meropenem (MERREM) 1 g in sodium chloride 0.9 % 100 mL IVPB  1 g Intravenous Q12H Michel Bickers, MD 200 mL/hr at 09/21/18 1042 1 g at 09/21/18 1042  . metoprolol tartrate (LOPRESSOR) tablet 25 mg  25 mg Oral BID Dixie Dials, MD   25 mg at 09/21/18 1208  . ondansetron (ZOFRAN) tablet 4 mg  4 mg Oral Q6H PRN Karmen Bongo, MD       Or  . ondansetron [facility]) injection 4 mg  4 mg Intravenous Q6H PRN Karmen Bongo, MD   4 mg at 09/21/18 1356  . polyvinyl alcohol (LIQUIFILM TEARS) 1.4 % ophthalmic solution 1 drop  1 drop Both Eyes Q2H PRN Karmen Bongo, MD      . sodium chloride flush (NS) 0.9 % injection 3 mL  3 mL Intravenous Q12H Karmen Bongo, MD   3 mL at 09/20/18 2200    REVIEW OF SYSTEMS: Unable to obtain PHYSICAL EXAMINATION: ECOG PERFORMANCE STATUS: 3 - Symptomatic, >50% confined to bed  Vitals:   09/21/18 1445 09/21/18 1500  BP: (!) 122/50 (!) 115/43  Pulse: 64 60  Resp: (!) 25 (!) 24  Temp:  97.7 F (36.5 C)  SpO2: 96% 93%   Filed Weights   09/20/18 0322 09/21/18 0330 09/21/18 1217  Weight: 164 lb 3.9 oz (74.5 kg) 166 lb 7.2 oz (75.5 kg) 166 lb 7.2 oz (75.5 kg)    GENERAL: Appears sleepy, intermittently alert to voice, appears comfortable  LABORATORY DATA:  I have reviewed the data as listed  Lab Results  Component Value Date   WBC 2.6 (L) 09/21/2018   HGB  9.9 (L) 09/21/2018   HCT 30.8 (L) 09/21/2018   MCV 89.0 09/21/2018   PLT 99 (L) 09/21/2018   Recent Labs    09/17/18 0445  09/19/18 0453 09/20/18 0706 09/21/18 0645  NA 141   < > 141 140 142  K 3.9   < > 3.9 3.4* 4.3  CL 114*   < > 113* 111 109  CO2 22   < > 19* 21* 23  GLUCOSE 114*   < > 110* 125* 95  BUN 25*   < > 27* 26* 32*  CREATININE 1.29*   < > 1.20* 1.11* 1.22*  CALCIUM 8.1*   < > 8.3* 8.6* 8.5*  GFRNONAA 35*   < > 38* 42* 37*  GFRAA 40*   < > 44* 48* 43*  PROT 5.2*  --   --  4.9* 5.2*  ALBUMIN 2.3*  --   --  2.2* 2.2*  AST 43*  --   --  17 20  ALT 61*  --   --  31 25  ALKPHOS 97  --   --  72 64  BILITOT 1.0  --   --  1.1 0.6   < > = values in this interval not displayed.    RADIOGRAPHIC STUDIES: I have personally reviewed the radiological images as listed and agreed with the findings in the report. Ct Abdomen Pelvis Wo Contrast  Result Date: 09/20/2018 CLINICAL DATA:  84 y/o F; suspected abscess of the anal and rectal region. EXAM: CT ABDOMEN AND PELVIS WITHOUT CONTRAST TECHNIQUE: Multidetector CT imaging of the abdomen and pelvis was performed following the standard protocol without IV contrast. COMPARISON:  09/16/2018 CT abdomen and pelvis. FINDINGS: Lower chest: Small bilateral pleural effusions. Hepatobiliary: No focal liver abnormality is seen. No gallstones, gallbladder wall thickening, or biliary dilatation. Pancreas: Unremarkable. No pancreatic ductal dilatation or surrounding inflammatory changes. Spleen: Spleen measures 12.6 x 6.7 x 10.2 cm (volume = 450 cm^3). Adrenals/Urinary Tract: Stable mild adrenal thickening without mass. No hydronephrosis or urinary stone disease. Left kidney lower pole 27 mm cyst with peripheral calcification. Right kidney upper pole 15 mm cyst with peripheral calcifications. Normal bladder. Stomach/Bowel: Stomach is within normal limits. Appendix not identified. No obstructive or inflammatory changes of the small bowel. Mild stable wall  thickening of sigmoid colon diverticulosis, wall thickening, and surrounding pericholecystic edema. No perforation or abscess. Vascular/Lymphatic: Aortic atherosclerosis. No enlarged abdominal or pelvic lymph nodes. Reproductive: Status post hysterectomy. No adnexal masses. Other: Stable small volume of fluid in the pelvis. Musculoskeletal: No fracture is seen. Stable edema within the right gluteal cleft. No discrete fluid collection is identified. Moderate lumbar levocurvature with apex at L2. No acute osseous abnormality identified. IMPRESSION: 1. Stable inflammation within the right gluteal cleft. No discrete fluid collection identified. 2. Stable findings suspicious for acute sigmoid diverticulitis. 3. New small bilateral pleural effusions. 4. Stable mild splenomegaly. 5. Aortic Atherosclerosis (ICD10-I70.0). Electronically Signed   By: Kristine Garbe M.D.   On: 09/20/2018 03:10   Ct Abdomen Pelvis Wo Contrast  Result Date: 09/16/2018 CLINICAL DATA:  Fever of unknown origin. Assess for enterovaginal fistula. History of rectal prolapse post rectocele repair. EXAM: CT ABDOMEN AND PELVIS WITHOUT CONTRAST TECHNIQUE: Multidetector CT imaging of the abdomen and pelvis was performed following the standard protocol without IV contrast. Attempted administration of rectal contrast, however patient did not tolerate. COMPARISON:  CT 07/08/2012.  Renal  ultrasound 08/16/2018 FINDINGS: Lower chest: Motion artifact with minor atelectasis. Aortic atherosclerosis. Heart size normal. Small hiatal hernia. Hepatobiliary: Mild motion artifact through the liver and gallbladder. No focal hepatic abnormality. No calcified gallstone or pericholecystic inflammation. No biliary dilatation. Pancreas: Parenchymal atrophy. No ductal dilatation or inflammation. Spleen: Normal in size without focal abnormality. Adrenals/Urinary Tract: Mild adrenal thickening without dominant mass. No hydronephrosis. Mild right perinephric edema.  Probable peripheral calcified cyst in the upper right kidney measuring 18 mm. Cyst in the lower left kidney has faint peripheral calcifications. Additional cortical cysts in both kidneys suspected. Urinary bladder is physiologically distended without wall thickening. Stomach/Bowel: Motion artifact and lack of oral contrast limits detailed bowel assessment. Minimal enteric contrast within the rectum and distal sigmoid colon. No rectal contrast in the vagina. Distal colonic diverticulosis, advanced in the sigmoid colon. There is sigmoid colonic wall thickening in the area of diverticula, likely moral hypertrophy, mild pericolonic edema in the distal sigmoid. Distal to the rectum is edema in the right greater than left gluteal soft tissues without well-defined fluid collection. Appendix not visualized, surgically absent per history. Scattered fluid-filled small bowel is nondilated and noninflamed. Vascular/Lymphatic: Aorto bi-iliac atherosclerosis. Aortic tortuosity without aneurysm. Limited assessment for adenopathy given lack contrast and paucity of intra-abdominal fat. No bulky adenopathy. Reproductive: Post hysterectomy. Unremarkable appearance of the vaginal cuff. No rectal contrast in the vaginal cuff, however patient did not tolerate rectal contrast administration. Other: Small amount of free fluid in the left pelvis. No free air. No upper abdominal ascites. Musculoskeletal: Scoliotic curvature in degenerative change in the spine. There are no acute or suspicious osseous abnormalities. IMPRESSION: 1. Findings suspicious for acute distal sigmoid diverticulitis. No perforation or abscess. 2. Small amount of free fluid in the left pelvis, likely reactive. 3. Unremarkable appearance of the vaginal cuff. 4. Perirectal edema tracking distally in the gluteal crease without perirectal abscess or soft tissue air. 5.  Aortic Atherosclerosis (ICD10-I70.0). Electronically Signed   By: Keith Rake M.D.   On: 09/16/2018  05:39   Dg Chest 2 View  Result Date: 09/16/2018 CLINICAL DATA:  Fever EXAM: CHEST - 2 VIEW COMPARISON:  08/14/2018 FINDINGS: Patient is rotated. Possible right infrahilar opacity, adjacent to the right heart border (overlying the spine) on the frontal radiograph, possibly posterior on the lateral view. This was also present on the prior. Left lung is clear. The heart is normal in size. IMPRESSION: Possible right infrahilar opacity, raising concern for right lower lobe pneumonia, although also present on the prior study. Consider CT chest with contrast for further evaluation. Electronically Signed   By: Julian Hy M.D.   On: 09/16/2018 01:29   Ct Head Wo Contrast  Result Date: 09/16/2018 CLINICAL DATA:  84 y/o F; fever of unknown origin. Altered mental status. History of hypertension and dementia. EXAM: CT HEAD WITHOUT CONTRAST TECHNIQUE: Contiguous axial images were obtained from the base of the skull through the vertex without intravenous contrast. COMPARISON:  08/15/2018 CT head FINDINGS: Brain: No evidence of acute infarction, hemorrhage, hydrocephalus, extra-axial collection or mass lesion/mass effect. Stable chronic microvascular ischemic changes and volume loss of the brain. Vascular: Calcific atherosclerosis of carotid siphons. No hyperdense vessel identified. Skull: Normal. Negative for fracture or focal lesion. Sinuses/Orbits: No acute finding. Other: None. IMPRESSION: 1. No acute intracranial abnormality identified. 2. Stable chronic microvascular ischemic changes and volume loss of the brain. Electronically Signed   By: Kristine Garbe M.D.   On: 09/16/2018 04:39    Heath Lark, MD 09/21/2018  4:01 PM

## 2018-09-21 NOTE — Progress Notes (Signed)
CC:  Subjective: Perirectal abscess - picture below, it is tender and fluctuant.       Objective: Vital signs in last 24 hours: Temp:  [97.8 F (36.6 C)-102.7 F (39.3 C)] 98.5 F (36.9 C) (10/07 0750) Pulse Rate:  [57-122] 97 (10/07 0750) Resp:  [21-35] 27 (10/07 0400) BP: (100-143)/(32-115) 137/115 (10/07 0750) SpO2:  [86 %-99 %] 96 % (10/07 0750) Weight:  [75.5 kg] 75.5 kg (10/07 0330) Last BM Date: 09/21/18 540 PO 350 IV Urine x 6 Stool x 4 TM 102 @ 1500 yesterday, afebrile since that time.  Creatinine 1.22 WBC 2.6 Transfused H/H  7.3/23.8 >>9.9/30.8 today     Intake/Output from previous day: 10/06 0701 - 10/07 0700 In: 869.6 [P.O.:540; I.V.:129.6; IV Piggyback:200] Out: -  Intake/Output this shift: No intake/output data recorded.  General appearance: alert, cooperative and mild distress Skin: Skin color, texture, turgor normal. No rashes or lesions or see picture of perirectal abscess      Lab Results:  Recent Labs    09/20/18 0706 09/21/18 0645  WBC 2.1* 2.6*  HGB 8.7* 9.9*  HCT 27.5* 30.8*  PLT 117* 99*    BMET Recent Labs    09/20/18 0706 09/21/18 0645  NA 140 142  K 3.4* 4.3  CL 111 109  CO2 21* 23  GLUCOSE 125* 95  BUN 26* 32*  CREATININE 1.11* 1.22*  CALCIUM 8.6* 8.5*   PT/INR No results for input(s): LABPROT, INR in the last 72 hours.  Recent Labs  Lab 09/16/18 0050 09/17/18 0445 09/20/18 0706 09/21/18 0645  AST 57* 43* 17 20  ALT 77* 61* 31 25  ALKPHOS 104 97 72 64  BILITOT 1.2 1.0 1.1 0.6  PROT 5.8* 5.2* 4.9* 5.2*  ALBUMIN 2.9* 2.3* 2.2* 2.2*     Lipase  No results found for: LIPASE   Medications: . sodium chloride   Intravenous Once  . aspirin  81 mg Oral Daily  . cycloSPORINE  1 drop Both Eyes BID  . diltiazem  60 mg Oral Q8H  . donepezil  10 mg Oral QHS  . levothyroxine  88 mcg Oral Q24H  . sodium chloride flush  3 mL Intravenous Q12H   . meropenem (MERREM) IV 1 g (09/20/18 2143)    Anti-infectives (From admission, onward)   Start     Dose/Rate Route Frequency Ordered Stop   09/20/18 1300  meropenem (MERREM) 1 g in sodium chloride 0.9 % 100 mL IVPB     1 g 200 mL/hr over 30 Minutes Intravenous Every 12 hours 09/20/18 1254     09/18/18 1600  ceFEPIme (MAXIPIME) 2 g in sodium chloride 0.9 % 100 mL IVPB  Status:  Discontinued     2 g 200 mL/hr over 30 Minutes Intravenous Every 24 hours 09/18/18 1358 09/20/18 1254   09/18/18 1200  vancomycin (VANCOCIN) IVPB 1000 mg/200 mL premix  Status:  Discontinued     1,000 mg 200 mL/hr over 60 Minutes Intravenous Every 48 hours 09/16/18 0918 09/17/18 1151   09/17/18 1400  vancomycin (VANCOCIN) IVPB 750 mg/150 ml premix  Status:  Discontinued     750 mg 150 mL/hr over 60 Minutes Intravenous Every 24 hours 09/17/18 1151 09/19/18 1151   09/17/18 1000  ceFEPIme (MAXIPIME) 1 g in sodium chloride 0.9 % 100 mL IVPB  Status:  Discontinued     1 g 200 mL/hr over 30 Minutes Intravenous Every 24 hours 09/16/18 0918 09/18/18 1358   09/16/18 1600  metroNIDAZOLE (FLAGYL)  IVPB 500 mg  Status:  Discontinued     500 mg 100 mL/hr over 60 Minutes Intravenous Every 8 hours 09/16/18 0905 09/20/18 1254   09/16/18 0915  ceFEPIme (MAXIPIME) 2 g in sodium chloride 0.9 % 100 mL IVPB  Status:  Discontinued     2 g 200 mL/hr over 30 Minutes Intravenous  Once 09/16/18 0905 09/17/18 1147   09/16/18 0915  vancomycin (VANCOCIN) IVPB 1000 mg/200 mL premix     1,000 mg 200 mL/hr over 60 Minutes Intravenous  Once 09/16/18 0905 09/16/18 1324   09/16/18 0615  metroNIDAZOLE (FLAGYL) IVPB 500 mg     500 mg 100 mL/hr over 60 Minutes Intravenous  Once 09/16/18 0607 09/16/18 0758   09/16/18 0200  cefTRIAXone (ROCEPHIN) 1 g in sodium chloride 0.9 % 100 mL IVPB     1 g 200 mL/hr over 30 Minutes Intravenous  Once 09/16/18 0146 09/16/18 0302     Assessment/Plan Myelodysplastic syndrome (HCC) - receiving transfusions - hgb/hct 8.7/27.5 Hypothyroidism Essential  hypertension Vascular dementia without behavioral disturbance (HCC) CKD (chronic kidney disease) stage 3, GFR 30-59 ml/min (HCC) AKI (acute kidney injury) ([place]) Diverticulitis   Right gluteal cleft cellulitis Possible UTI  FEN: DIII diet ID:  Multiple single dose as above; Meropenem 10/6 day 2 DVT: SCD/ASA Foley:  None Follow up:     Plan:  Seen and evaluated by Dr. Donne Hazel, she needs to go to the OR for I&D.  We will post and call family.      LOS: 5 days    Brittany [name] 09/21/2018 [phone]

## 2018-09-21 NOTE — Progress Notes (Signed)
PROGRESS NOTE  Brittany [name] OFB:[id number] DOB: 1924-03-08 DOA: 09/16/2018 PCP: Blanchie Serve, MD  HPI/Recap of past 24 hours:  Patient is seen after returned from OR, alert, denies pain, now in sinus rhythm son at bedside  Assessment/Plan: Principal Problem:   Perirectal inflammation Active Problems:   Myelodysplastic syndrome ([place])   Hypothyroidism   Essential hypertension   Vascular dementia without behavioral disturbance ([place])   CKD (chronic kidney disease) stage 3, GFR 30-59 ml/min (HCC)   AKI (acute kidney injury) ([place])   Diverticulitis   Fever   Fever with sepsis /metabolic encephalopathy on presentation, left gluteal cellulitis/abscess  CT head no acute findings CT ab/pel ? Sigmoid diverticulitis? But no ab pain on exam, Perirectal edema tracking distally in the gluteal crease without perirectal abscess or soft tissue air. -blood culture /urine culture no growth, mrsa screening negative. c diff negative -abx changed to meropenem -s/p I/D to right gluteal abscess on 10/7, surgical culture pending -will follow ID and General surgery recommendation  Acute on chronic anemia in the setting of MDS -hgb dropped to 6.4 on 10/4, 1prbc transfusion on 10/4 -S/p prbc transfusionx1 unit  on 10/5 per family request -Per family patient has been getting aranasp qmonthly last on 9/12 -Case discussed with hematology Dr Alvy Bimler, Dr Alvy Bimler input appreciated  Thrombocytopenia: new diagnosis plt 215 on presentation No heparin exposure this hospitalization, no sign of macrioangiopathy (tbili wnl) abx side effect? Vs from infection vs progressive MDS monitor  Afib/RVR: new diagnosis -thought patient does has h/o CVA, possible undetected PAF in the past -she has signs of volume overload, small bilateral pleural effusion, + 8liters since admission per I/o's -she received lasix 40mg  iv x1  -cardiology consulted, she is started on cardizem drip, she converted to sinus rhythm -echo  pending -meds adjustment per cardiology   Hypokalemia/Hypomagnesemia: Remain low continue to replace,  Keep K>4, mag>2 repeat lab in am  Elevated ast/alt -CT ab/pel "No focal hepatic abnormality. No calcified gallstone or pericholecystic inflammation. No biliary dilatation." -likely due to sepsis -no n/v -lft  Normalized on repeat   AKI on CKD III Cr on presentation was 2.06 Improving , Cr  is 1.1-1.2 last three days Urine culture no growth Losartan on hold  H/o CVA with vascular dementia:  On asa, donepezil Baseline recognize sons and aware of situation, not oriented to time, not recognize daughter in law  HTN bp Stable off losartan  Hypothyroidism Synthroid  FTT:  Fall at assisted living when try to get up without a walker. Family requested palliative care consult   Code Status: DNR  Family Communication: patient and son at bedside  Disposition Plan: not ready to d/c,  from snf, will need to return to snf when medically ready   Consultants:  ID  General surgery  Hematology  cardiology  Palliative care  Procedures:  I/D to right gluteal abscess on 10/7  Antibiotics:  As above    Objective: BP (!) 137/115 (BP Location: Left Arm)   Pulse 97   Temp 98.5 F (36.9 C) (Oral)   Resp (!) 27   Ht 4' 11.02" (1.499 m)   Wt 75.5 kg   SpO2 96%   BMI 33.60 kg/m   Intake/Output Summary (Last 24 hours) at 09/21/2018 1339 Last data filed at 09/21/2018 0600 Gross per 24 hour  Intake 409.58 ml  Output -  Net 409.58 ml   Filed Weights   09/20/18 0322 09/21/18 0330 09/21/18 1217  Weight: 74.5 kg 75.5 kg 75.5 kg  Exam: Patient is examined daily including today on 09/21/2018, exams remain the same as of yesterday except that has changed    General:  Frail, chronically ill, alert, NAD  Cardiovascular: RRR  Respiratory: diminished at basis, no wheezing  Abdomen: Soft/ND/NT, positive BS  Musculoskeletal: No Edema  Neuro:  alert, oriented to sons, know she is in the hospital  Data Reviewed: Basic Metabolic Panel: Recent Labs  Lab 09/17/18 0445 09/18/18 0511 09/19/18 0453 09/20/18 0706 09/21/18 0645  NA 141 142 141 140 142  K 3.9 4.1 3.9 3.4* 4.3  CL 114* 112* 113* 111 109  CO2 22 21* 19* 21* 23  GLUCOSE 114* 125* 110* 125* 95  BUN 25* 29* 27* 26* 32*  CREATININE 1.29* 1.32* 1.20* 1.11* 1.22*  CALCIUM 8.1* 8.0* 8.3* 8.6* 8.5*  MG  --   --  1.3* 1.6* 1.8   Liver Function Tests: Recent Labs  Lab 09/16/18 0050 09/17/18 0445 09/20/18 0706 09/21/18 0645  AST 57* 43* 17 20  ALT 77* 61* 31 25  ALKPHOS 104 97 72 64  BILITOT 1.2 1.0 1.1 0.6  PROT 5.8* 5.2* 4.9* 5.2*  ALBUMIN 2.9* 2.3* 2.2* 2.2*   No results for input(s): LIPASE, AMYLASE in the last 168 hours. No results for input(s): AMMONIA in the last 168 hours. CBC: Recent Labs  Lab 09/16/18 0050 09/18/18 0511 09/19/18 0453 09/20/18 0706 09/21/18 0645  WBC 12.0* 5.2 3.6* 2.1* 2.6*  NEUTROABS 9.0*  --  2.8 1.7  --   HGB 8.4* 6.4* 7.3* 8.7* 9.9*  HCT 27.3* 21.1* 23.8* 27.5* 30.8*  MCV 89.8 89.8 90.2 89.3 89.0  PLT 215 130* 98* 117* 99*   Cardiac Enzymes:   No results for input(s): CKTOTAL, CKMB, CKMBINDEX, TROPONINI in the last 168 hours. BNP (last 3 results) No results for input(s): BNP in the last 8760 hours.  ProBNP (last 3 results) No results for input(s): PROBNP in the last 8760 hours.  CBG: No results for input(s): GLUCAP in the last 168 hours.  Recent Results (from the past 240 hour(s))  Culture, blood (Routine x 2)     Status: None   Collection Time: 09/16/18 12:55 AM  Result Value Ref Range Status   Specimen Description BLOOD BLOOD LEFT FOREARM  Final   Special Requests   Final    BOTTLES DRAWN AEROBIC AND ANAEROBIC Blood Culture adequate volume   Culture   Final    NO GROWTH 5 DAYS Performed at [facility], [address], [place], [place] [address]    Report Status 09/21/2018 FINAL  Final  Culture,  blood (Routine x 2)     Status: None   Collection Time: 09/16/18 12:57 AM  Result Value Ref Range Status   Specimen Description BLOOD LEFT WRIST  Final   Special Requests   Final    BOTTLES DRAWN AEROBIC AND ANAEROBIC Blood Culture results may not be optimal due to an inadequate volume of blood received in culture bottles   Culture   Final    NO GROWTH 5 DAYS Performed at [facility], [place] [address] [place],  [address]    Report Status 09/21/2018 FINAL  Final  C difficile quick scan w PCR reflex     Status: None   Collection Time: 09/16/18  2:32 AM  Result Value Ref Range Status   C Diff antigen NEGATIVE NEGATIVE Final   C Diff toxin NEGATIVE NEGATIVE Final   C Diff interpretation No C. difficile detected.  Final    Comment: Performed at [facility], [place] [address] [place], [place] [address]  Culture, Urine     Status: None   Collection Time: 09/18/18  1:46 PM  Result Value Ref Range Status   Specimen Description URINE, CATHETERIZED  Final   Special Requests NONE  Final   Culture   Final    NO GROWTH Performed at [facility], [address], [place], [place] [address]    Report Status 09/19/2018 FINAL  Final  MRSA PCR Screening     Status: None   Collection Time: 09/19/18  1:34 PM  Result Value Ref Range Status   MRSA by PCR NEGATIVE NEGATIVE Final    Comment:        The GeneXpert MRSA Assay (FDA approved for NASAL specimens only), is one component of a comprehensive MRSA colonization surveillance program. It is not intended to diagnose MRSA infection nor to guide or monitor treatment for MRSA infections. Performed at [facility], [place] [address], [place], [place] [address]      Studies: No results found.  Scheduled Meds: . [MAR Hold] sodium chloride   Intravenous Once  . [MAR Hold] aspirin  81 mg Oral Daily  . [MAR Hold] cycloSPORINE  1 drop Both Eyes BID  . [MAR Hold] digoxin  0.25 mg Intravenous Once  . [MAR Hold] diltiazem   60 mg Oral Q8H  . [MAR Hold] donepezil  10 mg Oral QHS  . [MAR Hold] levothyroxine  88 mcg Oral Q24H  . [MAR Hold] metoprolol tartrate  25 mg Oral BID  . [MAR Hold] sodium chloride flush  3 mL Intravenous Q12H    Continuous Infusions: . lactated ringers 10 mL/hr at 09/21/18 1227  . [MAR Hold] meropenem (MERREM) IV 1 g (09/21/18 1042)     Time spent: 42mins, case discussed with hematology Dr Alvy Bimler and palliative care I have personally reviewed and interpreted on  09/21/2018 daily labs, tele strips, imagings as discussed above under date review session and assessment and plans.  I reviewed all nursing notes, pharmacy notes, consultant notes,  vitals, pertinent old records  I have discussed plan of care as described above with RN , patient and family on 09/21/2018   Florencia Reasons MD, PhD  [facility] Pager [phone]. If 7PM-7AM, please contact night-coverage at [email or web address], password [employer] 09/21/2018, 1:39 PM  LOS: 5 days

## 2018-09-21 NOTE — Transfer of Care (Signed)
Immediate Anesthesia Transfer of Care Note  Patient: Brittany [name]  Procedure(s) Performed: IRRIGATION AND DEBRIDEMENT PERIRECTAL ABSCESS (N/A )  Patient Location: PACU  Anesthesia Type:MAC  Level of Consciousness: awake and sedated  Airway & Oxygen Therapy: Patient connected to nasal cannula oxygen  Post-op Assessment: Post -op Vital signs reviewed and stable  Post vital signs: stable  Last Vitals:  Vitals Value Taken Time  BP    Temp    Pulse 58 09/21/2018  2:12 PM  Resp 9 09/21/2018  2:12 PM  SpO2 96 % 09/21/2018  2:12 PM  Vitals shown include unvalidated device data.  Last Pain:  Vitals:   09/21/18 0750  TempSrc: Oral  PainSc:       Patients Stated Pain Goal: 0 (99/83/38 2505)  Complications: No apparent anesthesia complications

## 2018-09-21 NOTE — Anesthesia Procedure Notes (Signed)
Procedure Name: MAC Date/Time: 09/21/2018 1:50 PM Performed by: Lavell Luster, CRNA Pre-anesthesia Checklist: Patient identified, Emergency Drugs available, Suction available, Patient being monitored and Timeout performed Patient Re-evaluated:Patient Re-evaluated prior to induction Oxygen Delivery Method: Nasal cannula Induction Type: IV induction Placement Confirmation: positive ETCO2 Dental Injury: Teeth and Oropharynx as per pre-operative assessment

## 2018-09-21 NOTE — Anesthesia Postprocedure Evaluation (Signed)
Anesthesia Post Note  Patient: Brittany [name]  Procedure(s) Performed: IRRIGATION AND DEBRIDEMENT PERIRECTAL ABSCESS (N/A Buttocks)     Patient location during evaluation: PACU Anesthesia Type: MAC Level of consciousness: awake and alert Pain management: pain level controlled Vital Signs Assessment: post-procedure vital signs reviewed and stable Respiratory status: spontaneous breathing, nonlabored ventilation, respiratory function stable and patient connected to nasal cannula oxygen Cardiovascular status: stable and blood pressure returned to baseline Postop Assessment: no apparent nausea or vomiting Anesthetic complications: no    Last Vitals:  Vitals:   09/21/18 1647 09/21/18 1929  BP: (!) 110/45 (!) 123/110  Pulse: 71 65  Resp: (!) 26 19  Temp: 36.8 C 36.9 C  SpO2: 99% 98%    Last Pain:  Vitals:   09/21/18 1929  TempSrc: Axillary  PainSc:                  Victorhugo Preis

## 2018-09-21 NOTE — Op Note (Signed)
Preoperative diagnosis: perirectal abscess Postoperative diagnosis: same as above Procedure: incision and drainage of perirectal abscess Surgeon: Dr Serita Grammes Anes: local mac EBL <20 cc Specimens: cultures to microbiology Complications none Sponge and needle count correct times two dispo to recovery stable  Indications: This is a 64 yof with perirectal abscess.  CT scan shows inflammation but on exam this area is erythematous, tender and fluctuant.  I discussed with she and her son going to or for drainage.  Procedure: After informed consent obtained patient taken to OR. She was already on antibiotics. SCDs were on as well.  She was rolled into right lateral decubitus position and appropriately padded.  I then prepped and draped her.  Surgical timeout was performed.  I infiltrated mixture of .25% marcaine and 1% lidocaine.  I then made a cruciate incision.  There was murky fluid I cultured. There was a sinus tract to the gluteal cleft and I opened that. The skin was very friable and just came apart.  I then opened the entire area bluntly.  I irrigated and packed it with iodoform gauze. She tolerated well and was transferred to pacu stable.

## 2018-09-21 NOTE — Progress Notes (Addendum)
[facility] for Infectious Disease  Date of Admission:  09/16/2018   Total days of antibiotics 6        Day 6 metronidazole         Day 5 cefepime           Patient ID: Brittany [name] is a 84 y.o. F with  Principal Problem:   Perirectal inflammation Active Problems:   Fever   Myelodysplastic syndrome (HCC)   Hypothyroidism   Essential hypertension   Vascular dementia without behavioral disturbance (HCC)   CKD (chronic kidney disease) stage 3, GFR 30-59 ml/min (HCC)   AKI (acute kidney injury) ([place])   Diverticulitis   . [MAR Hold] sodium chloride   Intravenous Once  . [MAR Hold] aspirin  81 mg Oral Daily  . [MAR Hold] cycloSPORINE  1 drop Both Eyes BID  . [MAR Hold] digoxin  0.25 mg Intravenous Once  . [MAR Hold] diltiazem  60 mg Oral Q8H  . [MAR Hold] donepezil  10 mg Oral QHS  . [MAR Hold] levothyroxine  88 mcg Oral Q24H  . [MAR Hold] metoprolol tartrate  25 mg Oral BID  . [MAR Hold] sodium chloride flush  3 mL Intravenous Q12H    SUBJECTIVE: Uncomfortable due to pain. Getting ready for OR. Son, Brittany [name] is in the room.    Allergies  Allergen Reactions  . Lisinopril     REACTION: angioedema  . Penicillins     REACTION: rash    OBJECTIVE: Vitals:   09/21/18 0330 09/21/18 0400 09/21/18 0750 09/21/18 1217  BP: 125/60 (!) 135/51 (!) 137/115   Pulse: (!) 57 68 97   Resp: (!) 25 (!) 27    Temp: 97.8 F (36.6 C)  98.5 F (36.9 C)   TempSrc: Axillary  Oral   SpO2: 95% 99% 96%   Weight: 75.5 kg   75.5 kg  Height:    4' 11.02" (1.499 m)   Body mass index is 33.6 kg/m.  Physical Exam  Constitutional: She appears well-developed and well-nourished.  Resting in bed in mild distress d/t pain.   HENT:  Mouth/Throat: Oropharynx is clear and moist.  Neck: Normal range of motion.  Cardiovascular: Normal rate.  Pulmonary/Chest: Effort normal. No respiratory distress.  Neurological: She is alert.  Skin: Skin is warm and dry.  Psychiatric: She has a  normal mood and affect.  Deferred gluteal exam as she is in a lot of pain and on the way to OR for drainage  Lab Results Lab Results  Component Value Date   WBC 2.6 (L) 09/21/2018   HGB 9.9 (L) 09/21/2018   HCT 30.8 (L) 09/21/2018   MCV 89.0 09/21/2018   PLT 99 (L) 09/21/2018    Lab Results  Component Value Date   CREATININE 1.22 (H) 09/21/2018   BUN 32 (H) 09/21/2018   NA 142 09/21/2018   K 4.3 09/21/2018   CL 109 09/21/2018   CO2 23 09/21/2018    Lab Results  Component Value Date   ALT 25 09/21/2018   AST 20 09/21/2018   ALKPHOS 64 09/21/2018   BILITOT 0.6 09/21/2018     Microbiology:   ASSESSMENT: 84 y.o. F with peri-rectal abscess and cellulitis involving the right gluteal cleft with ongoing high fevers to 103 F that may be starting to show some improvement after changing her antibiotics to meropenem yesterday. General surgery (Dr. Donne [name]) has seen her and planning for surgical I&D today with progressive tenderness  and fluctuance/cellulitis. Would request aerobic and anaerobic cultures of abscess (tissue if possible) to help determine offending bacteria.   I updated her son today and he is happy she is going to the OR as she is very uncomfortable.    PLAN: 1. Continue Meropenem  2. OR today for debridement and cultures 3. Further recommendations pending OR findings   Brittany Madeira, MSN, NP-C [facility]ious [place] [facility] Cell: [phone] Pager: [phone]  09/21/2018  1:29 PM   Patient seen and examined, agree with above note.  I dictated the care and orders written for this patient under my direction. I was immediately available on site, by phone/page to assist in the care of this patient.

## 2018-09-21 NOTE — Consult Note (Signed)
Ref: Blanchie Serve, MD   Subjective:  Awake. Starting to have loose BM. She converted to sinus rhythm yesterday. She is awaiting gluteal abscess surgery today.  Objective:  Vital Signs in the last 24 hours: Temp:  [97.8 F (36.6 C)-102.7 F (39.3 C)] 98.5 F (36.9 C) (10/07 0750) Pulse Rate:  [57-122] 97 (10/07 0750) Cardiac Rhythm: Normal sinus rhythm (10/07 0750) Resp:  [21-35] 27 (10/07 0400) BP: (100-143)/(32-115) 137/115 (10/07 0750) SpO2:  [86 %-99 %] 96 % (10/07 0750) Weight:  [75.5 kg] 75.5 kg (10/07 0330)  Physical Exam: BP Readings from Last 1 Encounters:  09/21/18 (!) 137/115     Wt Readings from Last 1 Encounters:  09/21/18 75.5 kg    Weight change: 1 kg Body mass index is 33.62 kg/m. HEENT: [place]/AT, Eyes-Hazel, Conjunctiva-Pink, Sclera-Non-icteric Neck: No JVD, No bruit, Trachea midline. Lungs:  Clearing, Bilateral. Cardiac:  Regular rhythm, normal S1 and S2, no S3. II/VI systolic murmur. Abdomen:  Soft, non-tender. BS present. Extremities:  No edema present. No cyanosis. No clubbing. CNS: AxOx2, Cranial nerves grossly intact, moves all 4 extremities.  Skin: Warm and dry.   Intake/Output from previous day: 10/06 0701 - 10/07 0700 In: 869.6 [P.O.:540; I.V.:129.6; IV Piggyback:200] Out: -     Lab Results: BMET    Component Value Date/Time   NA 142 09/21/2018 0645   NA 140 09/20/2018 0706   NA 141 09/19/2018 0453   NA 139 08/24/2018   NA 141 08/04/2018   NA 138 07/30/2018   NA 140 12/18/2017   NA 142 08/06/2016 1059   NA 141 04/20/2013 1320   NA 141 11/17/2012 1356   K 4.3 09/21/2018 0645   K 3.4 (L) 09/20/2018 0706   K 3.9 09/19/2018 0453   K 5.1 08/24/2018   K 4.4 08/06/2016 1059   K 4.4 04/20/2013 1320   K 4.6 11/17/2012 1356   CL 109 09/21/2018 0645   CL 111 09/20/2018 0706   CL 113 (H) 09/19/2018 0453   CL 106 08/04/2018   CL 106 07/30/2018   CL 109 (H) 04/20/2013 1320   CL 106 11/17/2012 1356   CO2 23 09/21/2018 0645   CO2 21 (L)  09/20/2018 0706   CO2 19 (L) 09/19/2018 0453   CO2 9.7 08/04/2018   CO2 21 07/30/2018   CO2 23 08/06/2016 1059   CO2 24 04/20/2013 1320   CO2 25 11/17/2012 1356   GLUCOSE 95 09/21/2018 0645   GLUCOSE 125 (H) 09/20/2018 0706   GLUCOSE 110 (H) 09/19/2018 0453   GLUCOSE 102 08/06/2016 1059   GLUCOSE 129 (H) 04/20/2013 1320   GLUCOSE 114 (H) 11/17/2012 1356   BUN 32 (H) 09/21/2018 0645   BUN 26 (H) 09/20/2018 0706   BUN 27 (H) 09/19/2018 0453   BUN 33 (A) 08/24/2018   BUN 28 (A) 08/04/2018   BUN 42 (A) 07/30/2018   BUN 35.0 (H) 08/06/2016 1059   BUN 27.4 (H) 04/20/2013 1320   BUN 28.0 (H) 11/17/2012 1356   CREATININE 1.22 (H) 09/21/2018 0645   CREATININE 1.11 (H) 09/20/2018 0706   CREATININE 1.20 (H) 09/19/2018 0453   CREATININE 1.67 08/24/2018   CREATININE 1.5 (H) 08/06/2016 1059   CREATININE 1.1 04/20/2013 1320   CREATININE 1.0 11/17/2012 1356   CALCIUM 8.5 (L) 09/21/2018 0645   CALCIUM 8.6 (L) 09/20/2018 0706   CALCIUM 8.3 (L) 09/19/2018 0453   CALCIUM 9.0 08/24/2018   CALCIUM 9.7 07/30/2018   CALCIUM 10.4 08/06/2016 1059   CALCIUM 9.1  04/20/2013 1320   CALCIUM 9.8 11/17/2012 1356   GFRNONAA 37 (L) 09/21/2018 0645   GFRNONAA 42 (L) 09/20/2018 0706   GFRNONAA 38 (L) 09/19/2018 0453   GFRNONAA 26 08/24/2018   GFRNONAA 39 08/04/2018   GFRNONAA 41 07/30/2018   GFRAA 43 (L) 09/21/2018 0645   GFRAA 48 (L) 09/20/2018 0706   GFRAA 44 (L) 09/19/2018 0453   CBC    Component Value Date/Time   WBC 2.6 (L) 09/21/2018 0645   RBC 3.46 (L) 09/21/2018 0645   HGB 9.9 (L) 09/21/2018 0645   HGB 8.3 08/24/2018   HCT 30.8 (L) 09/21/2018 0645   HCT 25 08/24/2018   HCT 31.1 (L) 08/06/2016 1058   PLT 99 (L) 09/21/2018 0645   PLT 368 08/06/2016 1058   MCV 89.0 09/21/2018 0645   MCV 80.6 08/24/2018   MCV 85.7 08/06/2016 1058   MCH 28.6 09/21/2018 0645   MCHC 32.1 09/21/2018 0645   RDW 17.2 (H) 09/21/2018 0645   RDW 17.1 (H) 08/06/2016 1058   LYMPHSABS 0.3 (L) 09/20/2018 0706    LYMPHSABS 1.7 08/06/2016 1058   MONOABS 0.1 09/20/2018 0706   MONOABS 0.6 08/06/2016 1058   EOSABS 0.0 09/20/2018 0706   EOSABS 0.2 08/06/2016 1058   BASOSABS 0.0 09/20/2018 0706   BASOSABS 0.0 08/06/2016 1058   HEPATIC Function Panel Recent Labs    09/17/18 0445 09/20/18 0706 09/21/18 0645  PROT 5.2* 4.9* 5.2*   HEMOGLOBIN A1C No components found for: HGA1C,  MPG CARDIAC ENZYMES No results found for: CKTOTAL, CKMB, CKMBINDEX, TROPONINI BNP No results for input(s): PROBNP in the last 8760 hours. TSH Recent Labs    02/24/18 08/11/18  TSH 3.18 1.88   CHOLESTEROL No results for input(s): CHOL in the last 8760 hours.  Scheduled Meds: . sodium chloride   Intravenous Once  . aspirin  81 mg Oral Daily  . cycloSPORINE  1 drop Both Eyes BID  . diltiazem  60 mg Oral Q8H  . donepezil  10 mg Oral QHS  . levothyroxine  88 mcg Oral Q24H  . metoprolol tartrate  25 mg Oral BID  . sodium chloride flush  3 mL Intravenous Q12H   Continuous Infusions: . meropenem (MERREM) IV 1 g (09/21/18 1042)   PRN Meds:.acetaminophen, albuterol, guaiFENesin, hydrALAZINE, ondansetron **OR** ondansetron (ZOFRAN) IV, polyvinyl alcohol  Assessment/Plan: Atrial fibrillation, paroxysmal Sinus rhythm Right gluteal cellulitis +/- abscess Hypertension Hypothyroidism Anemia, normocytic, normochromic Myelodysplasia Hypokalemia, resolved UTI, resolving Diarrhea, antibiotic induced  Awaiting echocardiogram Awaiting surgery for possible gluteal abscess Start oral diltiazem. Add small dose B-blocker.   LOS: 5 days    Dixie Dials  MD  09/21/2018, 11:07 AM

## 2018-09-21 NOTE — Progress Notes (Signed)
SLP Cancellation Note  Patient Details Name: Brittany [name] MRN: [id number] DOB: [birth year]   Cancelled treatment:        Defer dysphagia tx session with pt NPO for possible upcoming surgery. Will follow along.   Houston Siren 09/21/2018, 12:05 PM  Orbie Pyo Colvin Caroli.Ed Risk analyst [phone] Office [phone]

## 2018-09-22 ENCOUNTER — Inpatient Hospital Stay (HOSPITAL_COMMUNITY): Payer: Medicare Other

## 2018-09-22 ENCOUNTER — Encounter (HOSPITAL_COMMUNITY): Payer: Self-pay | Admitting: General Surgery

## 2018-09-22 DIAGNOSIS — D61818 Other pancytopenia: Secondary | ICD-10-CM

## 2018-09-22 DIAGNOSIS — F0150 Vascular dementia without behavioral disturbance: Secondary | ICD-10-CM

## 2018-09-22 LAB — BASIC METABOLIC PANEL
Anion gap: 6 (ref 5–15)
BUN: 34 mg/dL — ABNORMAL HIGH (ref 8–23)
CHLORIDE: 114 mmol/L — AB (ref 98–111)
CO2: 22 mmol/L (ref 22–32)
CREATININE: 0.98 mg/dL (ref 0.44–1.00)
Calcium: 8.2 mg/dL — ABNORMAL LOW (ref 8.9–10.3)
GFR calc Af Amer: 56 mL/min — ABNORMAL LOW (ref >60)
GFR calc non Af Amer: 49 mL/min — ABNORMAL LOW (ref >60)
GLUCOSE: 95 mg/dL (ref 70–99)
Potassium: 4.6 mmol/L (ref 3.5–5.1)
Sodium: 142 mmol/L (ref 135–145)

## 2018-09-22 LAB — CBC
HCT: 26.6 % — ABNORMAL LOW (ref 36.0–46.0)
HEMOGLOBIN: 8.2 g/dL — AB (ref 12.0–15.0)
MCH: 28.2 pg (ref 26.0–34.0)
MCHC: 30.8 g/dL (ref 30.0–36.0)
MCV: 91.4 fL (ref 80.0–100.0)
Platelets: 114 10*3/uL — ABNORMAL LOW (ref 150–400)
RBC: 2.91 MIL/uL — ABNORMAL LOW (ref 3.87–5.11)
RDW: 17.2 % — ABNORMAL HIGH (ref 11.5–15.5)
WBC: 1.5 10*3/uL — ABNORMAL LOW (ref 4.0–10.5)

## 2018-09-22 LAB — ECHOCARDIOGRAM COMPLETE
HEIGHTINCHES: 59.016 in
Weight: 2663.16 oz

## 2018-09-22 LAB — MAGNESIUM: Magnesium: 1.6 mg/dL — ABNORMAL LOW (ref 1.7–2.4)

## 2018-09-22 MED ORDER — MAGNESIUM SULFATE 2 GM/50ML IV SOLN
2.0000 g | Freq: Once | INTRAVENOUS | Status: AC
  Administered 2018-09-22: 2 g via INTRAVENOUS
  Filled 2018-09-22: qty 50

## 2018-09-22 MED ORDER — LOPERAMIDE HCL 2 MG PO CAPS
2.0000 mg | ORAL_CAPSULE | Freq: Once | ORAL | Status: AC
  Administered 2018-09-22: 2 mg via ORAL
  Filled 2018-09-22: qty 1

## 2018-09-22 NOTE — Progress Notes (Signed)
[facility] for Infectious Disease  Date of Admission:  09/16/2018   Total days of antibiotics 6        Day 6 metronidazole         Day 5 cefepime           Patient ID: Brittany [name] is a 84 y.o. F with  Principal Problem:   Perirectal inflammation Active Problems:   Fever   Myelodysplastic syndrome (HCC)   Hypothyroidism   Essential hypertension   Vascular dementia without behavioral disturbance (HCC)   CKD (chronic kidney disease) stage 3, GFR 30-59 ml/min (HCC)   AKI (acute kidney injury) ([place])   Diverticulitis   Sepsis with acute renal failure without septic shock ([place])   Palliative care encounter   . sodium chloride   Intravenous Once  . acetaminophen  1,000 mg Oral Q8H  . aspirin  81 mg Oral Daily  . cycloSPORINE  1 drop Both Eyes BID  . digoxin  0.25 mg Intravenous Once  . diltiazem  60 mg Oral Q8H  . donepezil  10 mg Oral QHS  . levothyroxine  88 mcg Oral Q24H  . metoprolol tartrate  25 mg Oral BID  . sodium chloride flush  3 mL Intravenous Q12H    SUBJECTIVE: Asleep and resting. Son in the room. Reports she has had improvement in cognition and alertness since surgery. Pain better managed.    Allergies  Allergen Reactions  . Lisinopril     REACTION: angioedema  . Penicillins     REACTION: rash    OBJECTIVE: Vitals:   09/22/18 0603 09/22/18 0915 09/22/18 0922 09/22/18 1417  BP: (!) 112/43 (!) 141/53  (!) 141/45  Pulse:  64  61  Resp:  (!) 21  17  Temp:  (!) 97.5 F (36.4 C)    TempSrc:  Axillary    SpO2:  99% 99% 100%  Weight:      Height:       Body mass index is 33.6 kg/m.  Physical Exam  Constitutional: She appears well-developed and well-nourished.  Asleep   HENT:  Mouth/Throat: Oropharynx is clear and moist.  Cardiovascular: Normal rate.  Pulmonary/Chest: Effort normal. No respiratory distress.  Skin: Skin is warm and dry.  Psychiatric: She has a normal mood and affect.  Deferred gluteal exam as she is asleep   Lab  Results Lab Results  Component Value Date   WBC 1.5 (L) 09/22/2018   HGB 8.2 (L) 09/22/2018   HCT 26.6 (L) 09/22/2018   MCV 91.4 09/22/2018   PLT 114 (L) 09/22/2018    Lab Results  Component Value Date   CREATININE 0.98 09/22/2018   BUN 34 (H) 09/22/2018   NA 142 09/22/2018   K 4.6 09/22/2018   CL 114 (H) 09/22/2018   CO2 22 09/22/2018    Lab Results  Component Value Date   ALT 25 09/21/2018   AST 20 09/21/2018   ALKPHOS 64 09/21/2018   BILITOT 0.6 09/21/2018     Microbiology: Results for orders placed or performed during the hospital encounter of 09/16/18  Culture, blood (Routine x 2)     Status: None   Collection Time: 09/16/18 12:55 AM  Result Value Ref Range Status   Specimen Description BLOOD BLOOD LEFT FOREARM  Final   Special Requests   Final    BOTTLES DRAWN AEROBIC AND ANAEROBIC Blood Culture adequate volume   Culture   Final    NO GROWTH 5  DAYS Performed at [facility], [place] [address], [place], [place] [address]    Report Status 09/21/2018 FINAL  Final  Culture, blood (Routine x 2)     Status: None   Collection Time: 09/16/18 12:57 AM  Result Value Ref Range Status   Specimen Description BLOOD LEFT WRIST  Final   Special Requests   Final    BOTTLES DRAWN AEROBIC AND ANAEROBIC Blood Culture results may not be optimal due to an inadequate volume of blood received in culture bottles   Culture   Final    NO GROWTH 5 DAYS Performed at [facility], [place] [address], [place], [place] [address]    Report Status 09/21/2018 FINAL  Final  C difficile quick scan w PCR reflex     Status: None   Collection Time: 09/16/18  2:32 AM  Result Value Ref Range Status   C Diff antigen NEGATIVE NEGATIVE Final   C Diff toxin NEGATIVE NEGATIVE Final   C Diff interpretation No C. difficile detected.  Final    Comment: Performed at [facility], [place] [address], [place], [place] [address]  Culture, Urine     Status: None   Collection Time: 09/18/18  1:46 PM    Result Value Ref Range Status   Specimen Description URINE, CATHETERIZED  Final   Special Requests NONE  Final   Culture   Final    NO GROWTH Performed at [facility], [address] [place], [place] [address]    Report Status 09/19/2018 FINAL  Final  MRSA PCR Screening     Status: None   Collection Time: 09/19/18  1:34 PM  Result Value Ref Range Status   MRSA by PCR NEGATIVE NEGATIVE Final    Comment:        The GeneXpert MRSA Assay (FDA approved for NASAL specimens only), is one component of a comprehensive MRSA colonization surveillance program. It is not intended to diagnose MRSA infection nor to guide or monitor treatment for MRSA infections. Performed at [facility], [place] [address] [place], [place] [address]   Surgical pcr screen     Status: None   Collection Time: 09/21/18 12:07 PM  Result Value Ref Range Status   MRSA, PCR NEGATIVE NEGATIVE Final   Staphylococcus aureus NEGATIVE NEGATIVE Final    Comment: (NOTE) The Xpert SA Assay (FDA approved for NASAL specimens in patients 39 years of age and older), is one component of a comprehensive surveillance program. It is not intended to diagnose infection nor to guide or monitor treatment. Performed at [facility], [place] [address], [place], [place] [address]   Aerobic/Anaerobic Culture (surgical/deep wound)     Status: None (Preliminary result)   Collection Time: 09/21/18  2:15 PM  Result Value Ref Range Status   Specimen Description ABSCESS PERIRECTAL  Final   Special Requests NONE  Final   Gram Stain NO WBC SEEN NO ORGANISMS SEEN   Final   Culture   Final    NO GROWTH < 24 HOURS Performed at [facility], [place] [address] [place],  [address]    Report Status PENDING  Incomplete     ASSESSMENT: 84 y.o. F with peri-rectal abscess and cellulitis involving the right gluteal cleft no POD 1 following debridement. She has had no further fevers since starting meropenem and intra  op cultures are negative on stain and culture at 24h. Will continue current antibiotic and follow cultures.  Discussed with her son possibility of requiring IV to finish course with concern over possible resistant organism prior to surgery. All questions answered.   Pancytopenic with neutrophils down to 1.5K. Victor on yesterday's lab 1700. Will repeat differential in AM. ?due to cephalosporin.    PLAN: 1. Continue Meropenem  2. Follow cultures  3. Discharge to SNF possible per son  4. CBC with diff in AM    [name], MSN, NP-C [facility] for Infectious [place] Cell: [phone] Pager: [phone]  09/22/2018  4:16 PM

## 2018-09-22 NOTE — Plan of Care (Signed)
  Problem: Education: Goal: Knowledge of General Education information will improve Description: Including pain rating scale, medication(s)/side effects and non-pharmacologic comfort measures Outcome: Progressing   Problem: Clinical Measurements: Goal: Will remain free from infection Outcome: Progressing Goal: Respiratory complications will improve Outcome: Progressing   

## 2018-09-22 NOTE — Progress Notes (Signed)
Physical Therapy Treatment Patient Details Name: Brittany [name] MRN: [id number] DOB: 12-28-1923 Today's Date: 09/22/2018    History of Present Illness PAtient is a 84 y/o female with h/o dementia, myelodysplastic syndrome, shoulder pain, DJD, melanoma, admitted with fever and treated for UTI and aspiration pna.  She was hospitalized 8/30-08/18/18 with sepsis due to aspiration PNA and UTI with Enterobacter and E. Coli bacteremia. 09/21/18 I&D perirectal abscess     PT Comments    Pt with knees beginning to buckle in standing so mobility limited to bed to chair. Pt with difficulty getting comfortable in chair due to I&D site on buttock. Continue to recommend return to SNF at DC.   Follow Up Recommendations  SNF;Supervision/Assistance - 24 hour     Equipment Recommendations  None recommended by PT    Recommendations for Other Services       Precautions / Restrictions Precautions Precautions: Fall Precaution Comments: recent falls at facility     Mobility  Bed Mobility Overal bed mobility: Needs Assistance Bed Mobility: Supine to Sit     Supine to sit: Mod assist;+2 for physical assistance     General bed mobility comments: Assist to bring legs off of bed, elevate trunk into sitting, and bring hips to EOB.   Transfers Overall transfer level: Needs assistance Equipment used: Rolling walker (2 wheeled) Transfers: Sit to/from [facility] Sit to Stand: +2 physical assistance;Mod assist Stand pivot transfers: +2 physical assistance;Mod assist       General transfer comment: Assist to bring hips up and for balance. Pt with frequent knee buckling in standing at bedside. Stand pivot bed to recliner with walker. Short shuffling steps.   Ambulation/Gait             General Gait Details: Unable due to knees buckling   Stairs             Wheelchair Mobility    Modified Rankin (Stroke Patients Only)       Balance Overall balance assessment: Needs  assistance Sitting-balance support: Bilateral upper extremity supported;Feet supported Sitting balance-Leahy Scale: Poor Sitting balance - Comments: UE for support. Pt leans left to relieve pressure and site of I&D   Standing balance support: Bilateral upper extremity supported;During functional activity Standing balance-Leahy Scale: Poor Standing balance comment: walker and mod assist for static standing.                            Cognition Arousal/Alertness: Awake/alert Behavior During Therapy: Flat affect Overall Cognitive Status: History of cognitive impairments - at baseline                                        Exercises      General Comments General comments (skin integrity, edema, etc.): VSS. SpO2 >94% throughout on RA      Pertinent Vitals/Pain Pain Assessment: Faces Faces Pain Scale: Hurts even more Pain Location: buttocks Pain Descriptors / Indicators: Grimacing;Guarding Pain Intervention(s): Limited activity within patient's tolerance;Monitored during session;Repositioned    Home Living                      Prior Function            PT Goals (current goals can now be found in the care plan section) Progress towards PT goals: Not progressing toward goals - comment(knees buckling)  Frequency    Min 2X/week      PT Plan Current plan remains appropriate    Co-evaluation              AM-PAC PT "6 Clicks" Daily Activity  Outcome Measure  Difficulty turning over in bed (including adjusting bedclothes, sheets and blankets)?: Unable Difficulty moving from lying on back to sitting on the side of the bed? : Unable Difficulty sitting down on and standing up from a chair with arms (e.g., wheelchair, bedside commode, etc,.)?: Unable Help needed moving to and from a bed to chair (including a wheelchair)?: A Lot Help needed walking in hospital room?: Total Help needed climbing 3-5 steps with a railing? : Total 6  Click Score: 7    End of Session Equipment Utilized During Treatment: Gait belt Activity Tolerance: Patient limited by fatigue Patient left: in chair;with call bell/phone within reach;with chair alarm set;with family/visitor present Nurse Communication: Mobility status;Need for lift equipment(possibly St[place] if knees buckling) PT Visit Diagnosis: Other abnormalities of gait and mobility (R26.89)     Time: 1140-1159 PT Time Calculation (min) (ACUTE ONLY): 19 min  Charges:  $Therapeutic Activity: 8-22 mins                     [place] Pager [phone] Office [place] 09/22/2018, 4:15 PM

## 2018-09-22 NOTE — Progress Notes (Addendum)
Daily Progress Note   Patient Name: Brittany [name]       Date: 09/22/2018 DOB: [birth year]  Age: 84 y.o. MRN#: [id number] Attending Physician: Florencia Reasons, MD Primary Care Physician: Blanchie Serve, MD Admit Date: 09/16/2018  Reason for Consultation/Follow-up: Establishing goals of care and Psychosocial/spiritual support  Subjective: I had a long discussion with Katheryn Culliton today about Palliative vs Hospice options.  We have arranged to have an extended family conference call on Thursday at noon.  We discussed his mother's life - She was a full time mother of 4.  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  She is a quiet protestant woman.  As she became demented her husband was her primary care taker.  He was Hosie Poisson and consequently they moved into independent living at [facility].  When he died she moved into assisted living.  She was doing well until labor day when she developed UTI/Sepsis/PNA.  At discharge she went to [facility] SNF into Acute Rehab.  They had her walking the halls again until this illness.    She has 4 children.  Antony Haste is here in [place].  He and his wife Janean Sark are her primary care takers.  Jacqlyn Larsen is from [place], Shanon Brow lives in [place] (wife is Magazine features editor), and Caren Griffins lives in [place].  Assessment: Patient is s/p I&D of gluteal abscess.  Per son her pain is improved.   Patient Profile/HPI: 84 y.o. female  with past medical history of CVA, vascular dementia, myelodysplasia, recurrent UTI, and recent bacteremia who was admitted from [facility] on 09/16/2018 with sepsis.  Work up revealed cellulitis of the gluteal cleft which could have  caused the sepsis.  Plan is for I&D today.  Hospitalization has been complicated by afib with RVR, and a drop in hgb to 6.4.   Palliative Medicine consultation was requested by the family in order to address the patient's overall health picture and anticipatory needs.   Length of Stay: 6  Current Medications: Scheduled Meds:  . sodium chloride   Intravenous Once  . acetaminophen  1,000 mg Oral Q8H  . aspirin  81 mg Oral Daily  . cycloSPORINE  1 drop Both Eyes BID  .  digoxin  0.25 mg Intravenous Once  . diltiazem  60 mg Oral Q8H  . donepezil  10 mg Oral QHS  . levothyroxine  88 mcg Oral Q24H  . metoprolol tartrate  25 mg Oral BID  . sodium chloride flush  3 mL Intravenous Q12H    Continuous Infusions: . lactated ringers 50 mL/hr at 09/22/18 1500  . meropenem (MERREM) IV 1 g (09/22/18 0925)    PRN Meds: albuterol, guaiFENesin, hydrALAZINE, ondansetron **OR** ondansetron (ZOFRAN) IV, polyvinyl alcohol, traMADol  Physical Exam        Vital Signs: BP (!) 141/45   Pulse 61   Temp (!) 97.5 F (36.4 C) (Axillary)   Resp 17   Ht 4' 11.02" (1.499 m)   Wt 75.5 kg   SpO2 100%   BMI 33.60 kg/m  SpO2: SpO2: 100 % O2 Device: O2 Device: Nasal Cannula O2 Flow Rate: O2 Flow Rate (L/min): 2 L/min  Intake/output summary:   Intake/Output Summary (Last 24 hours) at 09/22/2018 1541 Last data filed at 09/22/2018 1500 Gross per 24 hour  Intake 1423.47 ml  Output 305 ml  Net 1118.47 ml   LBM: Last BM Date: 09/22/18 Baseline Weight: Weight: 69.6 kg Most recent weight: Weight: 75.5 kg       Palliative Assessment/Data: 30%    Flowsheet Rows     Most Recent Value  Intake Tab  Referral Department  Hospitalist  Unit at Time of Referral  Cardiac/Telemetry Unit  Palliative Care Primary Diagnosis  Sepsis/Infectious Disease  Date Notified  09/20/18  Palliative Care Type  New Palliative care  Reason for referral  Clarify Goals of Care  Date of Admission  09/16/18  Date first seen by  Palliative Care  09/21/18  # of days Palliative referral response time  1 Day(s)  # of days IP prior to Palliative referral  4  Clinical Assessment  Psychosocial & Spiritual Assessment  Palliative Care Outcomes      Patient Active Problem List   Diagnosis Date Noted  . Sepsis with acute renal failure without septic shock ([place])   . Palliative care encounter   . Perirectal inflammation 09/20/2018  . Fever 09/18/2018  . Sepsis secondary to UTI ([place]) 09/16/2018  . Diverticulitis 09/16/2018  . Aspiration pneumonia of right lower lobe ([place]) 08/19/2018  . AKI (acute kidney injury) ([place])   . Fatigue 07/28/2018  . Weight loss 07/28/2018  . Actinic keratosis 06/26/2018  . Diastolic CHF ([place]) 34/19/3790  . History of CVA (cerebrovascular accident) 08/08/2017  . CKD (chronic kidney disease) stage 3, GFR 30-59 ml/min (HCC) 08/08/2017  . Anemia, iron deficiency 01/11/2016  . Unstable gait   . Shoulder pain   . History of melanoma 03/20/2015  . Constipation 07/18/2014  . Vascular dementia without behavioral disturbance ([place]) 11/11/2012  . Stroke ([place]) 12/17/2011  . Back pain 04/23/2011  . COLONIC POLYPS, HX OF 06/05/2009  . Myelodysplastic syndrome ([place]) 01/02/2009  . Hypothyroidism 08/21/2007  . Hyperlipidemia 08/21/2007  . Essential hypertension 08/21/2007  . DIVERTICULOSIS, COLON 08/21/2007  . OSTEOPOROSIS 08/21/2007  . SKIN CANCER, HX OF 08/21/2007    Palliative Care Plan    Recommendations/Plan:  PMT will follow with you  Extended family will come together on noon Thursday 10/10 for Palliative meeting  Goals of Care and Additional Recommendations:  Limitations on Scope of Treatment: Full Scope Treatment  Code Status:  DNR  Prognosis:   < 6 months in the setting of vascular dementia, recurrent infections, myelodysplasia requiring PRBC transfusions and  EPO, declining nutritional status, immobility   Discharge Planning:  [place] for rehab with  Palliative care service follow-up  Care plan was discussed with son  Thank you for allowing the Palliative Medicine Team to assist in the care of this patient.  Total time spent:  35 min.     Greater than 50%  of this time was spent counseling and coordinating care related to the above assessment and plan.  Florentina Jenny, PA-C Palliative Medicine  Please contact Palliative MedicineTeam phone at [phone] for questions and concerns between 7 am - 7 pm.   Please see AMION for individual provider pager

## 2018-09-22 NOTE — Progress Notes (Signed)
Text paged [facility] regarding the patient oozing yellow/brown liquid stool, have changed the perirectal dressing several times to keep it clean and free from infection, site looks good, red in the center and pink surrounding intact skin, awaiting further orders.

## 2018-09-22 NOTE — Progress Notes (Signed)
Unable to reach [facility] NP but Dr Armstead Peaks is on the unit, made him aware of issue with loose stool and I&D dressing being changed several times for loose stoll, will give a one time dose of Imodium, fecal incontinence system that was in patient's room was  placed and he was made aware but he wants to see if the Imodium will help, will remove if she starts to have formed stool, stool samples have been sent and came back negative for C-Diff, will continue to monitor.

## 2018-09-22 NOTE — NC FL2 (Addendum)
[place] MEDICAID FL2 LEVEL OF CARE SCREENING TOOL     IDENTIFICATION  Patient Name: Brittany [name] Birthdate: [birth year] Sex: female Admission Date (Current Location): 09/16/2018  [facility] and [place] Number:  [employer] and Address:  [facility] [place], [place] [address], [place], [place] [address]      Provider Number: [id number]  Attending Physician Name and Address:  Florencia Reasons, MD  Relative Name and Phone Number:  Briya Lookabaugh [phone]     Current Level of Care: Hospital Recommended Level of Care: [place] Prior Approval Number:    Date Approved/Denied:   PASRR Number: [id number]  Discharge Plan: SNF    Current Diagnoses: Patient Active Problem List   Diagnosis Date Noted  . Sepsis with acute renal failure without septic shock ([place])   . Palliative care encounter   . Perirectal inflammation 09/20/2018  . Fever 09/18/2018  . Sepsis secondary to UTI ([place]) 09/16/2018  . Diverticulitis 09/16/2018  . Aspiration pneumonia of right lower lobe ([place]) 08/19/2018  . AKI (acute kidney injury) ([place])   . Fatigue 07/28/2018  . Weight loss 07/28/2018  . Actinic keratosis 06/26/2018  . Diastolic CHF ([place]) 47/42/5956  . History of CVA (cerebrovascular accident) 08/08/2017  . CKD (chronic kidney disease) stage 3, GFR 30-59 ml/min (HCC) 08/08/2017  . Anemia, iron deficiency 01/11/2016  . Unstable gait   . Shoulder pain   . History of melanoma 03/20/2015  . Constipation 07/18/2014  . Vascular dementia without behavioral disturbance ([place]) 11/11/2012  . Stroke ([place]) 12/17/2011  . Back pain 04/23/2011  . COLONIC POLYPS, HX OF 06/05/2009  . Myelodysplastic syndrome ([place]) 01/02/2009  . Hypothyroidism 08/21/2007  . Hyperlipidemia 08/21/2007  . Essential hypertension 08/21/2007  . DIVERTICULOSIS, COLON 08/21/2007  . OSTEOPOROSIS 08/21/2007  . SKIN CANCER, HX OF 08/21/2007    Orientation RESPIRATION BLADDER Height & Weight     Self,  Time, Place  O2(nasal cannula 2L) Incontinent Weight: 166 lb 7.2 oz (75.5 kg) Height:  4' 11.02" (149.9 cm)  BEHAVIORAL SYMPTOMS/MOOD NEUROLOGICAL BOWEL NUTRITION STATUS      Incontinent Diet(please see DC summary)  AMBULATORY STATUS COMMUNICATION OF NEEDS Skin   Extensive Assist Verbally Normal                       Personal Care Assistance Level of Assistance  Bathing, Feeding, Dressing Bathing Assistance: Limited assistance Feeding assistance: Limited assistance Dressing Assistance: Limited assistance     Functional Limitations Info  Sight, Hearing, Speech Sight Info: Adequate Hearing Info: Adequate Speech Info: Adequate    SPECIAL CARE FACTORS FREQUENCY  PT (By licensed PT), OT (By licensed OT)     PT Frequency: 5x/week OT Frequency: 5x/week            Contractures Contractures Info: Not present    Additional Factors Info  Code Status, Allergies Code Status Info: DNR Allergies Info: Lisinopril, Penicillins           Current Medications (09/22/2018):  This is the current hospital active medication list Current Facility-Administered Medications  Medication Dose Route Frequency Provider Last Rate Last Dose  . 0.9 %  sodium chloride infusion (Manually program via Guardrails IV Fluids)   Intravenous Once Earnstine Regal, PA-C      . acetaminophen (TYLENOL) tablet 1,000 mg  1,000 mg Oral Q8H Earnstine Regal, PA-C   1,000 mg at 09/22/18 3875  . albuterol (PROVENTIL) (2.5 MG/3ML) 0.083% nebulizer solution 2.5 mg  2.5 mg Nebulization  Q6H PRN Earnstine Regal, PA-C   2.5 mg at 09/18/18 1758  . aspirin chewable tablet 81 mg  81 mg Oral Daily Earnstine Regal, PA-C   81 mg at 09/22/18 7106  . cycloSPORINE (RESTASIS) 0.05 % ophthalmic emulsion 1 drop  1 drop Both Eyes BID Earnstine Regal, PA-C   1 drop at 09/22/18 2694  . digoxin (LANOXIN) 0.25 MG/ML injection 0.25 mg  0.25 mg Intravenous Once Earnstine Regal, PA-C      . diltiazem (CARDIZEM) tablet 60 mg  60  mg Oral Q8H Earnstine Regal, PA-C   60 mg at 09/22/18 0603  . donepezil (ARICEPT) tablet 10 mg  10 mg Oral QHS Earnstine Regal, PA-C   10 mg at 09/21/18 2225  . guaiFENesin (ROBITUSSIN) 100 MG/5ML solution 100 mg  5 mL Oral Q4H PRN Earnstine Regal, PA-C   100 mg at 09/17/18 1352  . hydrALAZINE (APRESOLINE) injection 5 mg  5 mg Intravenous Q4H PRN Earnstine Regal, PA-C      . lactated ringers infusion   Intravenous Continuous Earnstine Regal, PA-C   Stopped at 09/22/18 8546  . levothyroxine (SYNTHROID, LEVOTHROID) tablet 88 mcg  88 mcg Oral Q24H Earnstine Regal, PA-C   88 mcg at 09/22/18 0602  . meropenem (MERREM) 1 g in sodium chloride 0.9 % 100 mL IVPB  1 g Intravenous Q12H Earnstine Regal, PA-C 200 mL/hr at 09/22/18 0925 1 g at 09/22/18 0925  . metoprolol tartrate (LOPRESSOR) tablet 25 mg  25 mg Oral BID Earnstine Regal, PA-C   25 mg at 09/22/18 2703  . ondansetron (ZOFRAN) tablet 4 mg  4 mg Oral Q6H PRN Earnstine Regal, PA-C       Or  . ondansetron [facility]) injection 4 mg  4 mg Intravenous Q6H PRN Earnstine Regal, PA-C   4 mg at 09/21/18 1356  . polyvinyl alcohol (LIQUIFILM TEARS) 1.4 % ophthalmic solution 1 drop  1 drop Both Eyes Q2H PRN Earnstine Regal, PA-C      . sodium chloride flush (NS) 0.9 % injection 3 mL  3 mL Intravenous Q12H Earnstine Regal, PA-C   3 mL at 09/22/18 5009  . traMADol (ULTRAM) tablet 50 mg  50 mg Oral Q6H PRN Earnstine Regal, PA-C   50 mg at 09/21/18 2344     Discharge Medications: Please see discharge summary for a list of discharge medications.  Relevant Imaging Results:  Relevant Lab Results:   Additional Information SSN: [id number]  Estanislado Emms, LCSW

## 2018-09-22 NOTE — Progress Notes (Signed)
[facility]/Trauma Progress Note  1 Day Post-Op   Assessment/Plan Myelodysplastic syndrome (HCC)- receiving transfusions - hgb/hct 8.7/27.5 Hypothyroidism Essential hypertension Vascular dementia without behavioral disturbance (HCC) CKD (chronic kidney disease) stage 3, GFR 30-59 ml/min (HCC) AKI (acute kidney injury) ([place]) Diverticulitis   Right gluteal cleft cellulitis and abscess - S/P I&D, Dr. Donne Hazel, 10/07  FEN: DIII diet ID:  currently Meropenem 10/02>> - WBC 1.5, afebrile, culture pending DVT: SCD/ASA Foley:  None Follow up:  2 weeks CCS for wound check  Plan:  Will pull packing tomorrow and start sitz baths. Encourage ambulation and keep wound clean of urine and stool    LOS: 6 days    Subjective: CC: buttock pain  No issues overnight. No family at bedside.   Objective: Vital signs in last 24 hours: Temp:  [97.5 F (36.4 C)-98.9 F (37.2 C)] 97.5 F (36.4 C) (10/08 0915) Pulse Rate:  [54-71] 64 (10/08 0915) Resp:  [15-26] 21 (10/08 0915) BP: (105-150)/(41-110) 141/53 (10/08 0915) SpO2:  [93 %-99 %] 99 % (10/08 0915) Weight:  [75.5 kg] 75.5 kg (10/07 1217) Last BM Date: 09/22/18  Intake/Output from previous day: 10/07 0701 - 10/08 0700 In: 1427.1 [P.O.:180; I.V.:1047.1; IV Piggyback:200] Out: 305 [Stool:305] Intake/Output this shift: No intake/output data recorded.  PE: Gen:  Alert, NAD, pleasant, cooperative Pulm:  Rate and effort normal GU: wound with improved erythema, packing in place, outer dressing changed. TTP, no purulent drainage noted Skin: warm and dry   Anti-infectives: Anti-infectives (From admission, onward)   Start     Dose/Rate Route Frequency Ordered Stop   09/20/18 1300  meropenem (MERREM) 1 g in sodium chloride 0.9 % 100 mL IVPB     1 g 200 mL/hr over 30 Minutes Intravenous Every 12 hours 09/20/18 1254     09/18/18 1600  ceFEPIme (MAXIPIME) 2 g in sodium chloride 0.9 % 100 mL IVPB  Status:  Discontinued      2 g 200 mL/hr over 30 Minutes Intravenous Every 24 hours 09/18/18 1358 09/20/18 1254   09/18/18 1200  vancomycin (VANCOCIN) IVPB 1000 mg/200 mL premix  Status:  Discontinued     1,000 mg 200 mL/hr over 60 Minutes Intravenous Every 48 hours 09/16/18 0918 09/17/18 1151   09/17/18 1400  vancomycin (VANCOCIN) IVPB 750 mg/150 ml premix  Status:  Discontinued     750 mg 150 mL/hr over 60 Minutes Intravenous Every 24 hours 09/17/18 1151 09/19/18 1151   09/17/18 1000  ceFEPIme (MAXIPIME) 1 g in sodium chloride 0.9 % 100 mL IVPB  Status:  Discontinued     1 g 200 mL/hr over 30 Minutes Intravenous Every 24 hours 09/16/18 0918 09/18/18 1358   09/16/18 1600  metroNIDAZOLE (FLAGYL) IVPB 500 mg  Status:  Discontinued     500 mg 100 mL/hr over 60 Minutes Intravenous Every 8 hours 09/16/18 0905 09/20/18 1254   09/16/18 0915  ceFEPIme (MAXIPIME) 2 g in sodium chloride 0.9 % 100 mL IVPB  Status:  Discontinued     2 g 200 mL/hr over 30 Minutes Intravenous  Once 09/16/18 0905 09/17/18 1147   09/16/18 0915  vancomycin (VANCOCIN) IVPB 1000 mg/200 mL premix     1,000 mg 200 mL/hr over 60 Minutes Intravenous  Once 09/16/18 0905 09/16/18 1324   09/16/18 0615  metroNIDAZOLE (FLAGYL) IVPB 500 mg     500 mg 100 mL/hr over 60 Minutes Intravenous  Once 09/16/18 0607 09/16/18 0758   09/16/18 0200  cefTRIAXone (ROCEPHIN) 1 g in sodium  chloride 0.9 % 100 mL IVPB     1 g 200 mL/hr over 30 Minutes Intravenous  Once 09/16/18 0146 09/16/18 0302      Lab Results:  Recent Labs    09/21/18 0645 09/22/18 0606  WBC 2.6* 1.5*  HGB 9.9* 8.2*  HCT 30.8* 26.6*  PLT 99* 114*   BMET Recent Labs    09/21/18 0645 09/22/18 0606  NA 142 142  K 4.3 4.6  CL 109 114*  CO2 23 22  GLUCOSE 95 95  BUN 32* 34*  CREATININE 1.22* 0.98  CALCIUM 8.5* 8.2*   PT/INR No results for input(s): LABPROT, INR in the last 72 hours. CMP     Component Value Date/Time   NA 142 09/22/2018 0606   NA 139 08/24/2018   NA 142  08/06/2016 1059   K 4.6 09/22/2018 0606   K 5.1 08/24/2018   K 4.4 08/06/2016 1059   CL 114 (H) 09/22/2018 0606   CL 106 08/04/2018   CL 109 (H) 04/20/2013 1320   CO2 22 09/22/2018 0606   CO2 9.7 08/04/2018   CO2 23 08/06/2016 1059   GLUCOSE 95 09/22/2018 0606   GLUCOSE 102 08/06/2016 1059   GLUCOSE 129 (H) 04/20/2013 1320   BUN 34 (H) 09/22/2018 0606   BUN 33 (A) 08/24/2018   BUN 35.0 (H) 08/06/2016 1059   CREATININE 0.98 09/22/2018 0606   CREATININE 1.67 08/24/2018   CREATININE 1.5 (H) 08/06/2016 1059   CALCIUM 8.2 (L) 09/22/2018 0606   CALCIUM 9.0 08/24/2018   CALCIUM 10.4 08/06/2016 1059   PROT 5.2 (L) 09/21/2018 0645   PROT 5.5 08/24/2018   PROT 6.7 08/06/2016 1059   ALBUMIN 2.2 (L) 09/21/2018 0645   ALBUMIN 3.5 08/24/2018   ALBUMIN 3.9 08/06/2016 1059   AST 20 09/21/2018 0645   AST 15 08/24/2018   AST 19 08/06/2016 1059   ALT 25 09/21/2018 0645   ALT 19 08/24/2018   ALT 24 08/06/2016 1059   ALKPHOS 64 09/21/2018 0645   ALKPHOS 75 08/24/2018   ALKPHOS 69 08/06/2016 1059   BILITOT 0.6 09/21/2018 0645   BILITOT 0.3 08/24/2018   BILITOT 0.61 08/06/2016 1059   GFRNONAA 49 (L) 09/22/2018 0606   GFRNONAA 26 08/24/2018   GFRAA 56 (L) 09/22/2018 0606   Lipase  No results found for: LIPASE  Studies/Results: No results found.    Kalman Drape , [facility] 09/22/2018, 9:59 AM  Pager: [phone] Mon-Wed, Friday 7:00am-4:30pm Thurs 7am-11:30am  Consults: [phone]

## 2018-09-22 NOTE — Progress Notes (Signed)
  Echocardiogram 2D Echocardiogram has been performed.  Yasmin Bronaugh L Androw 09/22/2018, 8:08 AM

## 2018-09-22 NOTE — Progress Notes (Signed)
  Speech Language Pathology Treatment: Dysphagia  Patient Details Name: Phelicia Dantes MRN: [id number] DOB: 12/11/1924 Today's Date: 09/22/2018 Time: [id number] SLP Time Calculation (min) (ACUTE ONLY): 10 min  Assessment / Plan / Recommendation Clinical Impression  Pt able to maintain alertness during session for swallow therapy; family not present. Masticated cracker with mild delays noted, no residual. No s/s aspiration with thin via cup or straw. Noted Palliative care following. ST recommending pt continue Dys 3 (mechanical soft diet), thin liquids, straws allowed and pills whole in puree. Will continue intervention for further education with family.    HPI HPI: Pt is a 84 y.o. female with medical history significant of dementia (?baseline oriented to self); myelodysplasia; hypothyroidism; HTN; HLD; stage 3 CKD; and h/o CVA (2013) presenting with fever and AMS. CXR concerning for RLL PNA. CT abdomen suspiscious for acute distal sigmoid diverticulitis. BSE 08/15/18 recommended NPO due to mentation, but on next date SLP reassessed and recommended regular diet and thin liquids as swallow appeared functional.       SLP Plan  Continue with current plan of care       Recommendations  Diet recommendations: Dysphagia 3 (mechanical soft);Thin liquid Liquids provided via: Cup;Straw Medication Administration: Whole meds with puree Supervision: Staff to assist with self feeding;Full supervision/cueing for compensatory strategies Compensations: Slow rate;Small sips/bites;Minimize environmental distractions Postural Changes and/or Swallow Maneuvers: Seated upright 90 degrees                Oral Care Recommendations: Oral care BID Follow up Recommendations: Skilled Nursing facility SLP Visit Diagnosis: Dysphagia, oropharyngeal phase (R13.12) Plan: Continue with current plan of care       GO                Houston Siren 09/22/2018, 2:16 PM  Orbie Pyo Colvin Caroli.Ed  Risk analyst [phone] Office [phone]

## 2018-09-22 NOTE — Progress Notes (Signed)
PROGRESS NOTE  Brittany [name] KGM:[id number] DOB: 08-22-1924 DOA: 09/16/2018 PCP: Blanchie Serve, MD  Brief Summary:  HPI: Brittany [name] is a 84 y.o. female with medical history significant of dementia (?baseline oriented to self); myelodysplasia; hypothyroidism; HTN; HLD; stage 3 CKD; and h/o CVA (2013) presenting with fever and AMS.   Patient was unaccompanied and unable to provide history.  HPI per Dr. Leonette Monarch: Triage Note  0040 Pt comes from [place] via [facility], was there with aspiration pneumonia, this weekend went with family by care to [place]. Today staff reported new AMS, fever 103, unknown LSN. Pt had blood work two days ago and WBC of 17. Neuro intact bilaterally. Hx of dementia. Pt is a DNR     She was hospitalized 8/30-08/18/18 with sepsis due to aspiration PNA and UTI with Enterobacter and E. Coli bacteremia resistant only to Cipro.    ED Course:  Fever last night to 102-103, lethargic, AMS.  8/19 admission for PNA and UTI. .  With I/O cath, fecal matter in vagina, ?fistula.  CT ?fistula, +diverticulitis, C diff negative.  CXR with RLL PNA.  Given Rocephin and Flagyl.     HPI/Recap of past 24 hours:  She underwent I/d to right gluteal wound on 17/7 No fever last 24hrs She is  alert, denies pain, now in sinus rhythm son at bedside  Assessment/Plan: Principal Problem:   Perirectal inflammation Active Problems:   Myelodysplastic syndrome ([place])   Hypothyroidism   Essential hypertension   Vascular dementia without behavioral disturbance (HCC)   CKD (chronic kidney disease) stage 3, GFR 30-59 ml/min (HCC)   AKI (acute kidney injury) ([place])   Diverticulitis   Fever   Sepsis with acute renal failure without septic shock (HCC)   Palliative care encounter   Fever with sepsis /metabolic encephalopathy on presentation, left gluteal cellulitis/abscess  CT head no acute findings CT ab/pel ? Sigmoid diverticulitis? But no ab pain on exam, Perirectal edema tracking  distally in the gluteal crease without perirectal abscess or soft tissue air. -blood culture /urine culture no growth, mrsa screening negative. c diff negative -abx changed to meropenem -s/p I/D to right gluteal abscess on 10/7, surgical culture pending -will follow ID and General surgery recommendation  Pancytopenia in the setting of MDS --s/p aranasp qmonthly last on 9/12 -hgb dropped to 6.4 on 10/4, 1prbc transfusion on 10/4 and 1 prbc transfusion on 10/5  -pancytopenia likely due to infection, monitor -hematology Dr Alvy Bimler consulted,  input appreciated    Afib/RVR: new diagnosis -thought patient does has h/o CVA, possible undetected PAF in the past -she has signs of volume overload, small bilateral pleural effusion, + 8liters since admission per I/o's -she received lasix 40mg  iv x1 on 10/7 -cardiology consulted, she is started on cardizem drip, she converted to sinus rhythm, now she is off cardizem drip, on oral cardizem and betablocker -echo LVEF wnl, grade 1 diastolic dysfunction, left atrium mildly dilated -meds adjustment per cardiology   Hypokalemia/Hypomagnesemia: Remain low, continue to replace,  Keep K>4, mag>2 repeat lab in am  Elevated ast/alt -CT ab/pel "No focal hepatic abnormality. No calcified gallstone or pericholecystic inflammation. No biliary dilatation." -likely due to sepsis -no n/v -lft  Normalized on repeat   AKI on CKD III Cr on presentation was 2.06 Improving , Cr  Normalized, today at 0.98 Urine culture no growth Losartan on hold  H/o CVA with vascular dementia:  On asa, donepezil Baseline recognize sons and aware of situation, not oriented to time,  not recognize daughter in law  HTN bp Stable off losartan  Hypothyroidism Synthroid  FTT:  Fell at assisted living when try to get up without a walker.  palliative care consulted   Code Status: DNR  Family Communication: patient and son at bedside  Disposition Plan:  snf in 1-2 days, need general surgery, cardiology, infectious disease, palliative care clearance    Consultants:  ID  General surgery  Hematology  cardiology  Palliative care  Procedures:  I/D to right gluteal abscess on 10/7  Antibiotics:  As above    Objective: BP (!) 141/53 (BP Location: Left Arm)   Pulse 64   Temp (!) 97.5 F (36.4 C) (Axillary)   Resp (!) 21   Ht 4' 11.02" (1.499 m)   Wt 75.5 kg   SpO2 99%   BMI 33.60 kg/m   Intake/Output Summary (Last 24 hours) at 09/22/2018 1101 Last data filed at 09/22/2018 0600 Gross per 24 hour  Intake 1427.09 ml  Output 305 ml  Net 1122.09 ml   Filed Weights   09/20/18 0322 09/21/18 0330 09/21/18 1217  Weight: 74.5 kg 75.5 kg 75.5 kg    Exam: Patient is examined daily including today on 09/22/2018, exams remain the same as of yesterday except that has changed    General:  Frail, chronically ill, alert, NAD  Cardiovascular: RRR  Respiratory: diminished at basis, no wheezing  Abdomen: Soft/ND/NT, positive BS  Musculoskeletal: No Edema  Neuro: alert, oriented to sons, know she is in the hospital  Data Reviewed: Basic Metabolic Panel: Recent Labs  Lab 09/18/18 0511 09/19/18 0453 09/20/18 0706 09/21/18 0645 09/22/18 0606  NA 142 141 140 142 142  K 4.1 3.9 3.4* 4.3 4.6  CL 112* 113* 111 109 114*  CO2 21* 19* 21* 23 22  GLUCOSE 125* 110* 125* 95 95  BUN 29* 27* 26* 32* 34*  CREATININE 1.32* 1.20* 1.11* 1.22* 0.98  CALCIUM 8.0* 8.3* 8.6* 8.5* 8.2*  MG  --  1.3* 1.6* 1.8 1.6*   Liver Function Tests: Recent Labs  Lab 09/16/18 0050 09/17/18 0445 09/20/18 0706 09/21/18 0645  AST 57* 43* 17 20  ALT 77* 61* 31 25  ALKPHOS 104 97 72 64  BILITOT 1.2 1.0 1.1 0.6  PROT 5.8* 5.2* 4.9* 5.2*  ALBUMIN 2.9* 2.3* 2.2* 2.2*   No results for input(s): LIPASE, AMYLASE in the last 168 hours. No results for input(s): AMMONIA in the last 168 hours. CBC: Recent Labs  Lab 09/16/18 0050  09/18/18 0511 09/19/18 0453 09/20/18 0706 09/21/18 0645 09/22/18 0606  WBC 12.0* 5.2 3.6* 2.1* 2.6* 1.5*  NEUTROABS 9.0*  --  2.8 1.7  --   --   HGB 8.4* 6.4* 7.3* 8.7* 9.9* 8.2*  HCT 27.3* 21.1* 23.8* 27.5* 30.8* 26.6*  MCV 89.8 89.8 90.2 89.3 89.0 91.4  PLT 215 130* 98* 117* 99* 114*   Cardiac Enzymes:   No results for input(s): CKTOTAL, CKMB, CKMBINDEX, TROPONINI in the last 168 hours. BNP (last 3 results) No results for input(s): BNP in the last 8760 hours.  ProBNP (last 3 results) No results for input(s): PROBNP in the last 8760 hours.  CBG: No results for input(s): GLUCAP in the last 168 hours.  Recent Results (from the past 240 hour(s))  Culture, blood (Routine x 2)     Status: None   Collection Time: 09/16/18 12:55 AM  Result Value Ref Range Status   Specimen Description BLOOD BLOOD LEFT FOREARM  Final   Special Requests  Final    BOTTLES DRAWN AEROBIC AND ANAEROBIC Blood Culture adequate volume   Culture   Final    NO GROWTH 5 DAYS Performed at [facility], [place] [address], [place], [place] [address]    Report Status 09/21/2018 FINAL  Final  Culture, blood (Routine x 2)     Status: None   Collection Time: 09/16/18 12:57 AM  Result Value Ref Range Status   Specimen Description BLOOD LEFT WRIST  Final   Special Requests   Final    BOTTLES DRAWN AEROBIC AND ANAEROBIC Blood Culture results may not be optimal due to an inadequate volume of blood received in culture bottles   Culture   Final    NO GROWTH 5 DAYS Performed at [facility], [place] [address], [place], [place] [address]    Report Status 09/21/2018 FINAL  Final  C difficile quick scan w PCR reflex     Status: None   Collection Time: 09/16/18  2:32 AM  Result Value Ref Range Status   C Diff antigen NEGATIVE NEGATIVE Final   C Diff toxin NEGATIVE NEGATIVE Final   C Diff interpretation No C. difficile detected.  Final    Comment: Performed at [facility], [place] [address]  [place], [place] [address]  Culture, Urine     Status: None   Collection Time: 09/18/18  1:46 PM  Result Value Ref Range Status   Specimen Description URINE, CATHETERIZED  Final   Special Requests NONE  Final   Culture   Final    NO GROWTH Performed at [facility], [address] [place], [place] [address]    Report Status 09/19/2018 FINAL  Final  MRSA PCR Screening     Status: None   Collection Time: 09/19/18  1:34 PM  Result Value Ref Range Status   MRSA by PCR NEGATIVE NEGATIVE Final    Comment:        The GeneXpert MRSA Assay (FDA approved for NASAL specimens only), is one component of a comprehensive MRSA colonization surveillance program. It is not intended to diagnose MRSA infection nor to guide or monitor treatment for MRSA infections. Performed at [facility], [place] [address] [place], [place] [address]   Surgical pcr screen     Status: None   Collection Time: 09/21/18 12:07 PM  Result Value Ref Range Status   MRSA, PCR NEGATIVE NEGATIVE Final   Staphylococcus aureus NEGATIVE NEGATIVE Final    Comment: (NOTE) The Xpert SA Assay (FDA approved for NASAL specimens in patients 7 years of age and older), is one component of a comprehensive surveillance program. It is not intended to diagnose infection nor to guide or monitor treatment. Performed at [facility], [place] [address], [place], [place] [address]   Aerobic/Anaerobic Culture (surgical/deep wound)     Status: None (Preliminary result)   Collection Time: 09/21/18  2:15 PM  Result Value Ref Range Status   Specimen Description ABSCESS PERIRECTAL  Final   Special Requests NONE  Final   Gram Stain NO WBC SEEN NO ORGANISMS SEEN   Final   Culture   Final    NO GROWTH < 24 HOURS Performed at [facility], [place] [address], [place],  [address]    Report Status PENDING  Incomplete     Studies: No results found.  Scheduled Meds: . sodium chloride   Intravenous Once  . acetaminophen   1,000 mg Oral Q8H  .  aspirin  81 mg Oral Daily  . cycloSPORINE  1 drop Both Eyes BID  . digoxin  0.25 mg Intravenous Once  . diltiazem  60 mg Oral Q8H  . donepezil  10 mg Oral QHS  . levothyroxine  88 mcg Oral Q24H  . metoprolol tartrate  25 mg Oral BID  . sodium chloride flush  3 mL Intravenous Q12H    Continuous Infusions: . lactated ringers 50 mL/hr at 09/22/18 1038  . magnesium sulfate 1 - 4 g bolus IVPB    . meropenem (MERREM) IV 1 g (09/22/18 0925)     Time spent: 63mins,  I have personally reviewed and interpreted on  09/22/2018 daily labs, tele strips, imagings as discussed above under date review session and assessment and plans.  I reviewed all nursing notes, pharmacy notes, consultant notes,  vitals, pertinent old records  I have discussed plan of care as described above with RN , patient and family on 09/22/2018   Florencia Reasons MD, PhD  [facility] Pager [phone]. If 7PM-7AM, please contact night-coverage at [email or web address], password [facility] 09/22/2018, 11:01 AM  LOS: 6 days

## 2018-09-22 NOTE — Consult Note (Signed)
Ref: Blanchie Serve, MD   Subjective:  Awake. Monitor shows sinus rhythm. She had perirectal abscess drained yesterday. Echocardiogram showed normal LV systolic function, mild LVH, mild AS, Mild MR and TR.  Objective:  Vital Signs in the last 24 hours: Temp:  [97.5 F (36.4 C)-98.9 F (37.2 C)] 97.5 F (36.4 C) (10/08 0915) Pulse Rate:  [54-71] 64 (10/08 0915) Cardiac Rhythm: Normal sinus rhythm (10/08 0830) Resp:  [15-26] 21 (10/08 0915) BP: (105-150)/(41-110) 141/53 (10/08 0915) SpO2:  [93 %-99 %] 99 % (10/08 0915)  Physical Exam: BP Readings from Last 1 Encounters:  09/22/18 (!) 141/53     Wt Readings from Last 1 Encounters:  09/21/18 75.5 kg    Weight change: 0 kg Body mass index is 33.6 kg/m. HEENT: [place]/AT, Eyes-[place], PERL, EOMI, Conjunctiva-Pale, Sclera-Non-icteric Neck: No JVD, No bruit, Trachea midline. Lungs:  Clear, Bilateral. Cardiac:  Regular rhythm, normal S1 and S2, no S3. II/VI systolic murmur. Abdomen:  Soft, non-tender. BS present. Extremities:  No edema present. No cyanosis. No clubbing. Gluteal area dressing and left lower leg dressing. CNS: AxOx2, Cranial nerves grossly intact, moves all 4 extremities.  Skin: Warm and dry.   Intake/Output from previous day: 10/07 0701 - 10/08 0700 In: 1427.1 [P.O.:180; I.V.:1047.1; IV Piggyback:200] Out: 305 [Stool:305]    Lab Results: BMET    Component Value Date/Time   NA 142 09/22/2018 0606   NA 142 09/21/2018 0645   NA 140 09/20/2018 0706   NA 139 08/24/2018   NA 141 08/04/2018   NA 138 07/30/2018   NA 140 12/18/2017   NA 142 08/06/2016 1059   NA 141 04/20/2013 1320   NA 141 11/17/2012 1356   K 4.6 09/22/2018 0606   K 4.3 09/21/2018 0645   K 3.4 (L) 09/20/2018 0706   K 5.1 08/24/2018   K 4.4 08/06/2016 1059   K 4.4 04/20/2013 1320   K 4.6 11/17/2012 1356   CL 114 (H) 09/22/2018 0606   CL 109 09/21/2018 0645   CL 111 09/20/2018 0706   CL 106 08/04/2018   CL 106 07/30/2018   CL 109 (H)  04/20/2013 1320   CL 106 11/17/2012 1356   CO2 22 09/22/2018 0606   CO2 23 09/21/2018 0645   CO2 21 (L) 09/20/2018 0706   CO2 9.7 08/04/2018   CO2 21 07/30/2018   CO2 23 08/06/2016 1059   CO2 24 04/20/2013 1320   CO2 25 11/17/2012 1356   GLUCOSE 95 09/22/2018 0606   GLUCOSE 95 09/21/2018 0645   GLUCOSE 125 (H) 09/20/2018 0706   GLUCOSE 102 08/06/2016 1059   GLUCOSE 129 (H) 04/20/2013 1320   GLUCOSE 114 (H) 11/17/2012 1356   BUN 34 (H) 09/22/2018 0606   BUN 32 (H) 09/21/2018 0645   BUN 26 (H) 09/20/2018 0706   BUN 33 (A) 08/24/2018   BUN 28 (A) 08/04/2018   BUN 42 (A) 07/30/2018   BUN 35.0 (H) 08/06/2016 1059   BUN 27.4 (H) 04/20/2013 1320   BUN 28.0 (H) 11/17/2012 1356   CREATININE 0.98 09/22/2018 0606   CREATININE 1.22 (H) 09/21/2018 0645   CREATININE 1.11 (H) 09/20/2018 0706   CREATININE 1.67 08/24/2018   CREATININE 1.5 (H) 08/06/2016 1059   CREATININE 1.1 04/20/2013 1320   CREATININE 1.0 11/17/2012 1356   CALCIUM 8.2 (L) 09/22/2018 0606   CALCIUM 8.5 (L) 09/21/2018 0645   CALCIUM 8.6 (L) 09/20/2018 0706   CALCIUM 9.0 08/24/2018   CALCIUM 9.7 07/30/2018   CALCIUM 10.4 08/06/2016  1059   CALCIUM 9.1 04/20/2013 1320   CALCIUM 9.8 11/17/2012 1356   GFRNONAA 49 (L) 09/22/2018 0606   GFRNONAA 37 (L) 09/21/2018 0645   GFRNONAA 42 (L) 09/20/2018 0706   GFRNONAA 26 08/24/2018   GFRNONAA 39 08/04/2018   GFRNONAA 41 07/30/2018   GFRAA 56 (L) 09/22/2018 0606   GFRAA 43 (L) 09/21/2018 0645   GFRAA 48 (L) 09/20/2018 0706   CBC    Component Value Date/Time   WBC 1.5 (L) 09/22/2018 0606   RBC 2.91 (L) 09/22/2018 0606   HGB 8.2 (L) 09/22/2018 0606   HGB 8.3 08/24/2018   HCT 26.6 (L) 09/22/2018 0606   HCT 25 08/24/2018   HCT 31.1 (L) 08/06/2016 1058   PLT 114 (L) 09/22/2018 0606   PLT 368 08/06/2016 1058   MCV 91.4 09/22/2018 0606   MCV 80.6 08/24/2018   MCV 85.7 08/06/2016 1058   MCH 28.2 09/22/2018 0606   MCHC 30.8 09/22/2018 0606   RDW 17.2 (H) 09/22/2018 0606    RDW 17.1 (H) 08/06/2016 1058   LYMPHSABS 0.3 (L) 09/20/2018 0706   LYMPHSABS 1.7 08/06/2016 1058   MONOABS 0.1 09/20/2018 0706   MONOABS 0.6 08/06/2016 1058   EOSABS 0.0 09/20/2018 0706   EOSABS 0.2 08/06/2016 1058   BASOSABS 0.0 09/20/2018 0706   BASOSABS 0.0 08/06/2016 1058   HEPATIC Function Panel Recent Labs    09/17/18 0445 09/20/18 0706 09/21/18 0645  PROT 5.2* 4.9* 5.2*   HEMOGLOBIN A1C No components found for: HGA1C,  MPG CARDIAC ENZYMES No results found for: CKTOTAL, CKMB, CKMBINDEX, TROPONINI BNP No results for input(s): PROBNP in the last 8760 hours. TSH Recent Labs    02/24/18 08/11/18  TSH 3.18 1.88   CHOLESTEROL No results for input(s): CHOL in the last 8760 hours.  Scheduled Meds: . sodium chloride   Intravenous Once  . acetaminophen  1,000 mg Oral Q8H  . aspirin  81 mg Oral Daily  . cycloSPORINE  1 drop Both Eyes BID  . digoxin  0.25 mg Intravenous Once  . diltiazem  60 mg Oral Q8H  . donepezil  10 mg Oral QHS  . levothyroxine  88 mcg Oral Q24H  . metoprolol tartrate  25 mg Oral BID  . sodium chloride flush  3 mL Intravenous Q12H   Continuous Infusions: . lactated ringers 50 mL/hr at 09/22/18 1038  . magnesium sulfate 1 - 4 g bolus IVPB 2 g (09/22/18 1220)  . meropenem (MERREM) IV 1 g (09/22/18 0925)   PRN Meds:.albuterol, guaiFENesin, hydrALAZINE, ondansetron **OR** ondansetron (ZOFRAN) IV, polyvinyl alcohol, traMADol  Assessment/Plan: Paroxysmal atrial fibrillation S/P perirectal abscess incision and drainage Hypertension Hypothyroidism Anemia, chronic Myelodysplasia Mild MR and TR  Continue medical treatment.   LOS: 6 days    Dixie Dials  MD  09/22/2018, 12:33 PM

## 2018-09-23 DIAGNOSIS — N183 Chronic kidney disease, stage 3 (moderate): Secondary | ICD-10-CM

## 2018-09-23 DIAGNOSIS — N179 Acute kidney failure, unspecified: Secondary | ICD-10-CM

## 2018-09-23 DIAGNOSIS — L0231 Cutaneous abscess of buttock: Secondary | ICD-10-CM

## 2018-09-23 LAB — CBC WITH DIFFERENTIAL/PLATELET
Abs Immature Granulocytes: 0.02 10*3/uL (ref 0.00–0.07)
BASOS ABS: 0 10*3/uL (ref 0.0–0.1)
Basophils Relative: 0 %
EOS ABS: 0 10*3/uL (ref 0.0–0.5)
Eosinophils Relative: 2 %
HCT: 27.4 % — ABNORMAL LOW (ref 36.0–46.0)
Hemoglobin: 8.3 g/dL — ABNORMAL LOW (ref 12.0–15.0)
IMMATURE GRANULOCYTES: 2 %
LYMPHS ABS: 0.7 10*3/uL (ref 0.7–4.0)
LYMPHS PCT: 51 %
MCH: 27.3 pg (ref 26.0–34.0)
MCHC: 30.3 g/dL (ref 30.0–36.0)
MCV: 90.1 fL (ref 80.0–100.0)
MONOS PCT: 15 %
Monocytes Absolute: 0.2 10*3/uL (ref 0.1–1.0)
NEUTROS ABS: 0.4 10*3/uL — AB (ref 1.7–7.7)
NEUTROS PCT: 30 %
NRBC: 0 % (ref 0.0–0.2)
PLATELETS: 137 10*3/uL — AB (ref 150–400)
RBC: 3.04 MIL/uL — ABNORMAL LOW (ref 3.87–5.11)
RDW: 17.5 % — AB (ref 11.5–15.5)
WBC: 1.3 10*3/uL — CL (ref 4.0–10.5)

## 2018-09-23 LAB — BASIC METABOLIC PANEL
ANION GAP: 4 — AB (ref 5–15)
BUN: 25 mg/dL — AB (ref 8–23)
CHLORIDE: 111 mmol/L (ref 98–111)
CO2: 26 mmol/L (ref 22–32)
Calcium: 8.6 mg/dL — ABNORMAL LOW (ref 8.9–10.3)
Creatinine, Ser: 0.89 mg/dL (ref 0.44–1.00)
GFR calc Af Amer: >60 mL/min (ref >60)
GFR calc non Af Amer: 55 mL/min — ABNORMAL LOW (ref >60)
GLUCOSE: 103 mg/dL — AB (ref 70–99)
Potassium: 4.6 mmol/L (ref 3.5–5.1)
Sodium: 141 mmol/L (ref 135–145)

## 2018-09-23 LAB — MAGNESIUM: Magnesium: 1.9 mg/dL (ref 1.7–2.4)

## 2018-09-23 MED ORDER — ACETAMINOPHEN 500 MG PO TABS
500.0000 mg | ORAL_TABLET | Freq: Three times a day (TID) | ORAL | Status: DC
  Administered 2018-09-23 – 2018-09-25 (×7): 500 mg via ORAL
  Filled 2018-09-23 (×7): qty 1

## 2018-09-23 NOTE — Progress Notes (Signed)
INFECTIOUS DISEASE PROGRESS NOTE  ID: Brittany [name] is a 84 y.o. female with  Principal Problem:   Perirectal inflammation Active Problems:   Myelodysplastic syndrome (HCC)   Hypothyroidism   Essential hypertension   Vascular dementia without behavioral disturbance (HCC)   CKD (chronic kidney disease) stage 3, GFR 30-59 ml/min (HCC)   AKI (acute kidney injury) ([place])   Diverticulitis   Fever   Sepsis with acute renal failure without septic shock (HCC)   Palliative care encounter  Subjective: Resting quietly, confused on awakening.   Abtx:  Anti-infectives (From admission, onward)   Start     Dose/Rate Route Frequency Ordered Stop   09/20/18 1300  meropenem (MERREM) 1 g in sodium chloride 0.9 % 100 mL IVPB     1 g 200 mL/hr over 30 Minutes Intravenous Every 12 hours 09/20/18 1254     09/18/18 1600  ceFEPIme (MAXIPIME) 2 g in sodium chloride 0.9 % 100 mL IVPB  Status:  Discontinued     2 g 200 mL/hr over 30 Minutes Intravenous Every 24 hours 09/18/18 1358 09/20/18 1254   09/18/18 1200  vancomycin (VANCOCIN) IVPB 1000 mg/200 mL premix  Status:  Discontinued     1,000 mg 200 mL/hr over 60 Minutes Intravenous Every 48 hours 09/16/18 0918 09/17/18 1151   09/17/18 1400  vancomycin (VANCOCIN) IVPB 750 mg/150 ml premix  Status:  Discontinued     750 mg 150 mL/hr over 60 Minutes Intravenous Every 24 hours 09/17/18 1151 09/19/18 1151   09/17/18 1000  ceFEPIme (MAXIPIME) 1 g in sodium chloride 0.9 % 100 mL IVPB  Status:  Discontinued     1 g 200 mL/hr over 30 Minutes Intravenous Every 24 hours 09/16/18 0918 09/18/18 1358   09/16/18 1600  metroNIDAZOLE (FLAGYL) IVPB 500 mg  Status:  Discontinued     500 mg 100 mL/hr over 60 Minutes Intravenous Every 8 hours 09/16/18 0905 09/20/18 1254   09/16/18 0915  ceFEPIme (MAXIPIME) 2 g in sodium chloride 0.9 % 100 mL IVPB  Status:  Discontinued     2 g 200 mL/hr over 30 Minutes Intravenous  Once 09/16/18 0905 09/17/18 1147   09/16/18 0915   vancomycin (VANCOCIN) IVPB 1000 mg/200 mL premix     1,000 mg 200 mL/hr over 60 Minutes Intravenous  Once 09/16/18 0905 09/16/18 1324   09/16/18 0615  metroNIDAZOLE (FLAGYL) IVPB 500 mg     500 mg 100 mL/hr over 60 Minutes Intravenous  Once 09/16/18 0607 09/16/18 0758   09/16/18 0200  cefTRIAXone (ROCEPHIN) 1 g in sodium chloride 0.9 % 100 mL IVPB     1 g 200 mL/hr over 30 Minutes Intravenous  Once 09/16/18 0146 09/16/18 0302      Medications:  Scheduled: . sodium chloride   Intravenous Once  . acetaminophen  500 mg Oral Q8H  . aspirin  81 mg Oral Daily  . cycloSPORINE  1 drop Both Eyes BID  . diltiazem  60 mg Oral Q8H  . donepezil  10 mg Oral QHS  . levothyroxine  88 mcg Oral Q24H  . metoprolol tartrate  25 mg Oral BID  . sodium chloride flush  3 mL Intravenous Q12H    Objective: Vital signs in last 24 hours: Temp:  [98 F (36.7 C)-98.4 F (36.9 C)] 98.3 F (36.8 C) (10/09 1209) Pulse Rate:  [52-76] 52 (10/09 1209) Resp:  [13-28] 17 (10/09 0745) BP: (116-154)/(44-91) 130/58 (10/09 1209) SpO2:  [97 %-100 %] 100 % (10/09 1209)  General appearance: no distress Resp: clear to auscultation bilaterally Cardio: regular rate and rhythm GI: normal findings: bowel sounds normal and soft, non-tender  Lab Results Recent Labs    09/22/18 0606 09/23/18 0443  WBC 1.5* 1.3*  HGB 8.2* 8.3*  HCT 26.6* 27.4*  NA 142 141  K 4.6 4.6  CL 114* 111  CO2 22 26  BUN 34* 25*  CREATININE 0.98 0.89   Liver Panel Recent Labs    09/21/18 0645  PROT 5.2*  ALBUMIN 2.2*  AST 20  ALT 25  ALKPHOS 64  BILITOT 0.6   Sedimentation Rate No results for input(s): ESRSEDRATE in the last 72 hours. C-Reactive Protein No results for input(s): CRP in the last 72 hours.  Microbiology: Recent Results (from the past 240 hour(s))  Culture, blood (Routine x 2)     Status: None   Collection Time: 09/16/18 12:55 AM  Result Value Ref Range Status   Specimen Description BLOOD BLOOD LEFT  FOREARM  Final   Special Requests   Final    BOTTLES DRAWN AEROBIC AND ANAEROBIC Blood Culture adequate volume   Culture   Final    NO GROWTH 5 DAYS Performed at [facility], [address] [place], [place] [address]    Report Status 09/21/2018 FINAL  Final  Culture, blood (Routine x 2)     Status: None   Collection Time: 09/16/18 12:57 AM  Result Value Ref Range Status   Specimen Description BLOOD LEFT WRIST  Final   Special Requests   Final    BOTTLES DRAWN AEROBIC AND ANAEROBIC Blood Culture results may not be optimal due to an inadequate volume of blood received in culture bottles   Culture   Final    NO GROWTH 5 DAYS Performed at [facility], [place] [address], [place], [place] [address]    Report Status 09/21/2018 FINAL  Final  C difficile quick scan w PCR reflex     Status: None   Collection Time: 09/16/18  2:32 AM  Result Value Ref Range Status   C Diff antigen NEGATIVE NEGATIVE Final   C Diff toxin NEGATIVE NEGATIVE Final   C Diff interpretation No C. difficile detected.  Final    Comment: Performed at [facility], [place] [address] [place], [place] [address]  Culture, Urine     Status: None   Collection Time: 09/18/18  1:46 PM  Result Value Ref Range Status   Specimen Description URINE, CATHETERIZED  Final   Special Requests NONE  Final   Culture   Final    NO GROWTH Performed at [facility], [address] [place], [place] [address]    Report Status 09/19/2018 FINAL  Final  MRSA PCR Screening     Status: None   Collection Time: 09/19/18  1:34 PM  Result Value Ref Range Status   MRSA by PCR NEGATIVE NEGATIVE Final    Comment:        The GeneXpert MRSA Assay (FDA approved for NASAL specimens only), is one component of a comprehensive MRSA colonization surveillance program. It is not intended to diagnose MRSA infection nor to guide or monitor treatment for MRSA infections. Performed at [facility], [place] [address],  [place], [place] [address]   Surgical pcr screen     Status: None   Collection Time: 09/21/18 12:07 PM  Result Value Ref Range Status   MRSA, PCR NEGATIVE NEGATIVE Final   Staphylococcus aureus NEGATIVE  NEGATIVE Final    Comment: (NOTE) The Xpert SA Assay (FDA approved for NASAL specimens in patients 66 years of age and older), is one component of a comprehensive surveillance program. It is not intended to diagnose infection nor to guide or monitor treatment. Performed at [facility], [place] [address], [place], [place] [address]   Aerobic/Anaerobic Culture (surgical/deep wound)     Status: None (Preliminary result)   Collection Time: 09/21/18  2:15 PM  Result Value Ref Range Status   Specimen Description ABSCESS PERIRECTAL  Final   Special Requests NONE  Final   Gram Stain NO WBC SEEN NO ORGANISMS SEEN   Final   Culture   Final    NO GROWTH 2 DAYS Performed at [facility], [address], [place], [place] [address]    Report Status PENDING  Incomplete    Studies/Results: No results found.   Assessment/Plan: Perirectal abscess Pancytopenia  Total days of antibiotics: 7 (3 merrem)  Surgical note reviewed Will continue merrem- will continue to re-eval use and neutropenia Palliative care eval in process         Bobby Rumpf MD, FACP Infectious Diseases (pager) [phone] [email or web address] 09/23/2018, 1:40 PM  LOS: 7 days

## 2018-09-23 NOTE — Consult Note (Signed)
Ref: Blanchie Serve, MD   Subjective:  Awake. VS stable. Monitor shows sinus rhythm with APCs.  Objective:  Vital Signs in the last 24 hours: Temp:  [98 F (36.7 C)-98.4 F (36.9 C)] 98.1 F (36.7 C) (10/09 0745) Pulse Rate:  [55-76] 55 (10/09 0745) Cardiac Rhythm: Sinus bradycardia;Heart block (10/09 0700) Resp:  [13-28] 17 (10/09 0745) BP: (116-154)/(44-91) 135/50 (10/09 0745) SpO2:  [97 %-100 %] 97 % (10/09 0745)  Physical Exam: BP Readings from Last 1 Encounters:  09/23/18 (!) 135/50     Wt Readings from Last 1 Encounters:  09/21/18 75.5 kg    Weight change:  Body mass index is 33.6 kg/m. HEENT: [place]/AT, Eyes-[place], PERL, EOMI, Conjunctiva-Pale, Sclera-Non-icteric Neck: No JVD, No bruit, Trachea midline. Lungs:  Clear, Bilateral. Cardiac:  Regular rhythm, normal S1 and S2, no S3. II/VI systolic murmur. Abdomen:  Soft, non-tender. BS present. Extremities:  No edema present. No cyanosis. No clubbing. Gluteal area dressing, left lower leg dressing. CNS: AxOx2, Cranial nerves grossly intact, moves all 4 extremities.  Skin: Warm and dry.   Intake/Output from previous day: 10/08 0701 - 10/09 0700 In: 1269 [I.V.:1123.8; IV Piggyback:145.2] Out: -     Lab Results: BMET    Component Value Date/Time   NA 141 09/23/2018 0443   NA 142 09/22/2018 0606   NA 142 09/21/2018 0645   NA 139 08/24/2018   NA 141 08/04/2018   NA 138 07/30/2018   NA 140 12/18/2017   NA 142 08/06/2016 1059   NA 141 04/20/2013 1320   NA 141 11/17/2012 1356   K 4.6 09/23/2018 0443   K 4.6 09/22/2018 0606   K 4.3 09/21/2018 0645   K 5.1 08/24/2018   K 4.4 08/06/2016 1059   K 4.4 04/20/2013 1320   K 4.6 11/17/2012 1356   CL 111 09/23/2018 0443   CL 114 (H) 09/22/2018 0606   CL 109 09/21/2018 0645   CL 106 08/04/2018   CL 106 07/30/2018   CL 109 (H) 04/20/2013 1320   CL 106 11/17/2012 1356   CO2 26 09/23/2018 0443   CO2 22 09/22/2018 0606   CO2 23 09/21/2018 0645   CO2 9.7 08/04/2018   CO2 21 07/30/2018   CO2 23 08/06/2016 1059   CO2 24 04/20/2013 1320   CO2 25 11/17/2012 1356   GLUCOSE 103 (H) 09/23/2018 0443   GLUCOSE 95 09/22/2018 0606   GLUCOSE 95 09/21/2018 0645   GLUCOSE 102 08/06/2016 1059   GLUCOSE 129 (H) 04/20/2013 1320   GLUCOSE 114 (H) 11/17/2012 1356   BUN 25 (H) 09/23/2018 0443   BUN 34 (H) 09/22/2018 0606   BUN 32 (H) 09/21/2018 0645   BUN 33 (A) 08/24/2018   BUN 28 (A) 08/04/2018   BUN 42 (A) 07/30/2018   BUN 35.0 (H) 08/06/2016 1059   BUN 27.4 (H) 04/20/2013 1320   BUN 28.0 (H) 11/17/2012 1356   CREATININE 0.89 09/23/2018 0443   CREATININE 0.98 09/22/2018 0606   CREATININE 1.22 (H) 09/21/2018 0645   CREATININE 1.67 08/24/2018   CREATININE 1.5 (H) 08/06/2016 1059   CREATININE 1.1 04/20/2013 1320   CREATININE 1.0 11/17/2012 1356   CALCIUM 8.6 (L) 09/23/2018 0443   CALCIUM 8.2 (L) 09/22/2018 0606   CALCIUM 8.5 (L) 09/21/2018 0645   CALCIUM 9.0 08/24/2018   CALCIUM 9.7 07/30/2018   CALCIUM 10.4 08/06/2016 1059   CALCIUM 9.1 04/20/2013 1320   CALCIUM 9.8 11/17/2012 1356   GFRNONAA 55 (L) 09/23/2018 0443   GFRNONAA  49 (L) 09/22/2018 0606   GFRNONAA 37 (L) 09/21/2018 0645   GFRNONAA 26 08/24/2018   GFRNONAA 39 08/04/2018   GFRNONAA 41 07/30/2018   GFRAA >60 09/23/2018 0443   GFRAA 56 (L) 09/22/2018 0606   GFRAA 43 (L) 09/21/2018 0645   CBC    Component Value Date/Time   WBC 1.3 (LL) 09/23/2018 0443   RBC 3.04 (L) 09/23/2018 0443   HGB 8.3 (L) 09/23/2018 0443   HGB 8.3 08/24/2018   HCT 27.4 (L) 09/23/2018 0443   HCT 25 08/24/2018   HCT 31.1 (L) 08/06/2016 1058   PLT 137 (L) 09/23/2018 0443   PLT 368 08/06/2016 1058   MCV 90.1 09/23/2018 0443   MCV 80.6 08/24/2018   MCV 85.7 08/06/2016 1058   MCH 27.3 09/23/2018 0443   MCHC 30.3 09/23/2018 0443   RDW 17.5 (H) 09/23/2018 0443   RDW 17.1 (H) 08/06/2016 1058   LYMPHSABS 0.7 09/23/2018 0443   LYMPHSABS 1.7 08/06/2016 1058   MONOABS 0.2 09/23/2018 0443   MONOABS 0.6 08/06/2016  1058   EOSABS 0.0 09/23/2018 0443   EOSABS 0.2 08/06/2016 1058   BASOSABS 0.0 09/23/2018 0443   BASOSABS 0.0 08/06/2016 1058   HEPATIC Function Panel Recent Labs    09/17/18 0445 09/20/18 0706 09/21/18 0645  PROT 5.2* 4.9* 5.2*   HEMOGLOBIN A1C No components found for: HGA1C,  MPG CARDIAC ENZYMES No results found for: CKTOTAL, CKMB, CKMBINDEX, TROPONINI BNP No results for input(s): PROBNP in the last 8760 hours. TSH Recent Labs    02/24/18 08/11/18  TSH 3.18 1.88   CHOLESTEROL No results for input(s): CHOL in the last 8760 hours.  Scheduled Meds: . sodium chloride   Intravenous Once  . acetaminophen  500 mg Oral Q8H  . aspirin  81 mg Oral Daily  . cycloSPORINE  1 drop Both Eyes BID  . diltiazem  60 mg Oral Q8H  . donepezil  10 mg Oral QHS  . levothyroxine  88 mcg Oral Q24H  . metoprolol tartrate  25 mg Oral BID  . sodium chloride flush  3 mL Intravenous Q12H   Continuous Infusions: . lactated ringers 50 mL/hr at 09/23/18 0639  . meropenem (MERREM) IV 1 g (09/22/18 2137)   PRN Meds:.albuterol, guaiFENesin, hydrALAZINE, ondansetron **OR** ondansetron (ZOFRAN) IV, polyvinyl alcohol, traMADol  Assessment/Plan: Paroxysmal atrial fibrillation, CHA2DS2VASc score of 6 S/P perirectal abscess incision and drainage Hypertension Hypothyroidism Myelodysplasia Severe leukopenia Mild anemia Mild thrombocytopenia  Will not give chronic anticoagulation in this patient except aspirin as tolerated due to myelodysplasia with recurrent significant thrombocytopenia and lekopenia   LOS: 7 days    Dixie Dials  MD  09/23/2018, 9:23 AM

## 2018-09-23 NOTE — Progress Notes (Signed)
Chart reviewed. Sat at bedside with patient.  She complained of being sleepy but denied pain.  She does not remember that she has a sore on her bottom.  She is in NAD.  Spoke with son on the phone.  He mentioned her bradycardia ("When I walked into the room her heart rate we 30 bpm").  He went on to say that it has not been that low prior to today - he is usually sitting at bedside monitoring her closely.   I committed to pass his concern on to the attending physician.   Son suggested that Cardiology should revisit.  Patient briefly discussed with CSW regarding disposition.  Physical therapy spoke with me about their concerns.  Patient appears to be less mobile and has declined during the hospitalization.  She was able to stand and pivot.  Now she is full assist to roll, she can not tolerate sitting EOB, her knees buckle when she stands.   PMT meeting scheduled with family at noon 10/10.  Florentina Jenny, PA-C Palliative Medicine Pager: [phone]  Time 15 min.

## 2018-09-23 NOTE — Progress Notes (Signed)
Nutrition Follow-up  DOCUMENTATION CODES:   Not applicable, Obesity unspecified  INTERVENTION:    Continue Magic cup TID with meals, each supplement provides 290 kcal and 9 grams of protein  NUTRITION DIAGNOSIS:   Inadequate oral intake related to dysphagia as evidenced by per patient/family report.  Ongoing  GOAL:   Patient will meet greater than or equal to 90% of their needs  Unmet  MONITOR:   PO intake, Supplement acceptance  ASSESSMENT:   84 yo female with PMH of melanoma, diverticulosis, HLD, hypothyroidism, osteoporosis, myelodysplasia, anemia, HTN, DJD, CVA, dementia who was admitted on 10/2 with sepsis r/t UTI.  S/P I&D of perirectal abscess 10/7.  Patient continues to eat poorly. Mostly consuming </= 50% of meals. She is receiving Magic cup on trays to maximize protein and calorie intake.  Plans for Palliative Care meeting with family tomorrow.    Labs and medications reviewed.  Diet Order:   Diet Order            DIET DYS 3 Room service appropriate? Yes; Fluid consistency: Thin  Diet effective now              EDUCATION NEEDS:   No education needs have been identified at this time  Skin:  Skin Assessment: (perirectal abscess)  Last BM:  10/9  Height:   Ht Readings from Last 1 Encounters:  09/21/18 4' 11.02" (1.499 m)    Weight:   Wt Readings from Last 1 Encounters:  09/21/18 75.5 kg    Ideal Body Weight:  44.5 kg  BMI:  Body mass index is 33.6 kg/m.  Estimated Nutritional Needs:   Kcal:  1400-1600  Protein:  70-80 gm  Fluid:  1.4-1.6 L    Molli Barrows, RD, LDN, [place] Pager [phone] After Hours Pager [phone]

## 2018-09-23 NOTE — Care Management Important Message (Signed)
Important Message  Patient Details  Name: Brittany [name] MRN: [id number] Date of Birth: 1924-03-01   Medicare Important Message Given:  Yes Patient unable to sign. Unsigned copy left at bedside.   Wendell Fiebig P Dewanna Hurston 09/23/2018, 11:01 AM

## 2018-09-23 NOTE — Progress Notes (Signed)
[facility] Progress Note  2 Days Post-Op   Assessment/Plan Myelodysplastic syndrome (HCC)- receiving transfusions - hgb/hct 8.7/27.5 Hypothyroidism Essential hypertension Vascular dementia without behavioral disturbance (HCC) CKD (chronic kidney disease) stage 3, GFR 30-59 ml/min (HCC) AKI (acute kidney injury) ([place]) Diverticulitis   Right gluteal cleft cellulitis and abscess - S/P I&D, Dr. Donne Hazel, 10/07  FEN: DIII diet ID: currently Meropenem 10/02>> - WBC 1.5, afebrile, culture shows no growth DVT: SCD/ASA Foley: None Follow up: 2 weeks CCS for wound check  Plan: start sitz baths BID and after every bowel movement. Encourage ambulation and keep wound clean of urine and stool   LOS: 7 days    Subjective: CC: buttock pain  No issues overnight. Pt sleepy this am. Packing removed.   Objective: Vital signs in last 24 hours: Temp:  [98 F (36.7 C)-98.4 F (36.9 C)] 98.1 F (36.7 C) (10/09 0745) Pulse Rate:  [55-76] 55 (10/09 0745) Resp:  [13-28] 17 (10/09 0745) BP: (116-154)/(44-91) 135/50 (10/09 0745) SpO2:  [97 %-100 %] 97 % (10/09 0745) Last BM Date: 09/22/18  Intake/Output from previous day: 10/08 0701 - 10/09 0700 In: 1269 [I.V.:1123.8; IV Piggyback:145.2] Out: -  Intake/Output this shift: No intake/output data recorded.  PE: Gen:  Alert, NAD, pleasant, cooperative Pulm:  Rate and effort normal GU: see photo below Skin: warm and dry      Anti-infectives: Anti-infectives (From admission, onward)   Start     Dose/Rate Route Frequency Ordered Stop   09/20/18 1300  meropenem (MERREM) 1 g in sodium chloride 0.9 % 100 mL IVPB     1 g 200 mL/hr over 30 Minutes Intravenous Every 12 hours 09/20/18 1254     09/18/18 1600  ceFEPIme (MAXIPIME) 2 g in sodium chloride 0.9 % 100 mL IVPB  Status:  Discontinued     2 g 200 mL/hr over 30 Minutes Intravenous Every 24 hours 09/18/18 1358 09/20/18 1254   09/18/18 1200  vancomycin  (VANCOCIN) IVPB 1000 mg/200 mL premix  Status:  Discontinued     1,000 mg 200 mL/hr over 60 Minutes Intravenous Every 48 hours 09/16/18 0918 09/17/18 1151   09/17/18 1400  vancomycin (VANCOCIN) IVPB 750 mg/150 ml premix  Status:  Discontinued     750 mg 150 mL/hr over 60 Minutes Intravenous Every 24 hours 09/17/18 1151 09/19/18 1151   09/17/18 1000  ceFEPIme (MAXIPIME) 1 g in sodium chloride 0.9 % 100 mL IVPB  Status:  Discontinued     1 g 200 mL/hr over 30 Minutes Intravenous Every 24 hours 09/16/18 0918 09/18/18 1358   09/16/18 1600  metroNIDAZOLE (FLAGYL) IVPB 500 mg  Status:  Discontinued     500 mg 100 mL/hr over 60 Minutes Intravenous Every 8 hours 09/16/18 0905 09/20/18 1254   09/16/18 0915  ceFEPIme (MAXIPIME) 2 g in sodium chloride 0.9 % 100 mL IVPB  Status:  Discontinued     2 g 200 mL/hr over 30 Minutes Intravenous  Once 09/16/18 0905 09/17/18 1147   09/16/18 0915  vancomycin (VANCOCIN) IVPB 1000 mg/200 mL premix     1,000 mg 200 mL/hr over 60 Minutes Intravenous  Once 09/16/18 0905 09/16/18 1324   09/16/18 0615  metroNIDAZOLE (FLAGYL) IVPB 500 mg     500 mg 100 mL/hr over 60 Minutes Intravenous  Once 09/16/18 0607 09/16/18 0758   09/16/18 0200  cefTRIAXone (ROCEPHIN) 1 g in sodium chloride 0.9 % 100 mL IVPB     1 g 200 mL/hr over 30 Minutes Intravenous  Once 09/16/18 0146 09/16/18 0302      Lab Results:  Recent Labs    09/22/18 0606 09/23/18 0443  WBC 1.5* 1.3*  HGB 8.2* 8.3*  HCT 26.6* 27.4*  PLT 114* 137*   BMET Recent Labs    09/22/18 0606 09/23/18 0443  NA 142 141  K 4.6 4.6  CL 114* 111  CO2 22 26  GLUCOSE 95 103*  BUN 34* 25*  CREATININE 0.98 0.89  CALCIUM 8.2* 8.6*   PT/INR No results for input(s): LABPROT, INR in the last 72 hours. CMP     Component Value Date/Time   NA 141 09/23/2018 0443   NA 139 08/24/2018   NA 142 08/06/2016 1059   K 4.6 09/23/2018 0443   K 5.1 08/24/2018   K 4.4 08/06/2016 1059   CL 111 09/23/2018 0443   CL 106  08/04/2018   CL 109 (H) 04/20/2013 1320   CO2 26 09/23/2018 0443   CO2 9.7 08/04/2018   CO2 23 08/06/2016 1059   GLUCOSE 103 (H) 09/23/2018 0443   GLUCOSE 102 08/06/2016 1059   GLUCOSE 129 (H) 04/20/2013 1320   BUN 25 (H) 09/23/2018 0443   BUN 33 (A) 08/24/2018   BUN 35.0 (H) 08/06/2016 1059   CREATININE 0.89 09/23/2018 0443   CREATININE 1.67 08/24/2018   CREATININE 1.5 (H) 08/06/2016 1059   CALCIUM 8.6 (L) 09/23/2018 0443   CALCIUM 9.0 08/24/2018   CALCIUM 10.4 08/06/2016 1059   PROT 5.2 (L) 09/21/2018 0645   PROT 5.5 08/24/2018   PROT 6.7 08/06/2016 1059   ALBUMIN 2.2 (L) 09/21/2018 0645   ALBUMIN 3.5 08/24/2018   ALBUMIN 3.9 08/06/2016 1059   AST 20 09/21/2018 0645   AST 15 08/24/2018   AST 19 08/06/2016 1059   ALT 25 09/21/2018 0645   ALT 19 08/24/2018   ALT 24 08/06/2016 1059   ALKPHOS 64 09/21/2018 0645   ALKPHOS 75 08/24/2018   ALKPHOS 69 08/06/2016 1059   BILITOT 0.6 09/21/2018 0645   BILITOT 0.3 08/24/2018   BILITOT 0.61 08/06/2016 1059   GFRNONAA 55 (L) 09/23/2018 0443   GFRNONAA 26 08/24/2018   GFRAA >60 09/23/2018 0443   Lipase  No results found for: LIPASE  Studies/Results: No results found.    Kalman Drape , [facility] 09/23/2018, 9:42 AM  Pager: [phone] Mon-Wed, Friday 7:00am-4:30pm Thurs 7am-11:30am  Consults: [phone]

## 2018-09-23 NOTE — Progress Notes (Signed)
Patient ID: Brittany [name], female   DOB: May 29, 1924, 84 y.o.   MRN: [id number]  PROGRESS NOTE    Brittany [name]  MBT:[id number] DOB: 27-Jun-1924 DOA: 09/16/2018 PCP: Blanchie Serve, MD   Brief Narrative:  84 year old female with history of dementia, myelodysplasia, hypothyroidism, hypertension, hyperlipidemia, chronic kidney disease stage III, CVA in 2013 presented on 09/16/2018 with fever and altered mental status.  CT of the abdomen and pelvis showed probable acute distal sigmoid diverticulitis and chest x-ray showed right lower lobe probable infiltrates.  Patient was started on intravenous antibiotics.  ID was consulted.  Patient had persistent fevers.  Patient had repeat CT of the abdomen which showed no evidence of abscess but there was stable inflammation in the right gluteal cleft.  Antibiotics was changed to meropenem.  General surgery was consulted.  Cardiology was also consulted for A. fib with RVR and patient was started on Cardizem drip which was subsequently switched to oral Cardizem.  Patient had I&D done of the left gluteal abscess on 09/21/2018.  Palliative care was also consulted.  Hematology was also consulted for pancytopenia.   Assessment & Plan:   Principal Problem:   Perirectal inflammation Active Problems:   Myelodysplastic syndrome (HCC)   Hypothyroidism   Essential hypertension   Vascular dementia without behavioral disturbance (HCC)   CKD (chronic kidney disease) stage 3, GFR 30-59 ml/min (HCC)   AKI (acute kidney injury) ([place])   Diverticulitis   Fever   Sepsis with acute renal failure without septic shock (HCC)   Palliative care encounter   Sepsis secondary to left gluteal cellulitis/abscess -Antibiotic plan as below  Left gluteal cellulitis/abscess -Status post I&D on 09/21/2018 by general surgery.  Wound care as per general surgery recommendations.  No growth from the OR cultures -Continue meropenem as per ID  Pancytopenia in the setting of MDS -Oncology  following -WBC 1.3 today with neutrophil of 0.4.  Communicated with Dr. Gorsuch/oncology and will avoid G-CSF unless patient is septic or spiking temperatures -Monitor.  No signs of bleeding   Paroxysmal atrial fibrillation -Cardiology following.  Cardizem drip has been switched to oral Cardizem.  Continue metoprolol.  Currently patient has sinus bradycardia -Not a good candidate for anticoagulation as per cardiology  AKI on chronic kidney disease stage III -Resolved.  Monitor.  Losartan on hold  History of CVA with vascular dementia -Continue aspirin and donepezil.  Patient's baseline mental status is apparently she recognizes her sons  Hypertension -Monitor blood pressure.  Off losartan  Hypothyroidism -Continue Synthroid  Failure to thrive/generalized deconditioning -Overall prognosis is guarded to poor.  Palliative care following and planning for family conference call on Thursday   DVT prophylaxis: No Lovenox because of thrombus cytopenia Code Status: DNR Family Communication: None at bedside Disposition Plan: Depends on clinical outcome  Consultants: ID/general surgery/hematology/cardiology/palliative care  Procedures: I&D of right gluteal abscess on 09/21/2018  Antimicrobials:  Anti-infectives (From admission, onward)   Start     Dose/Rate Route Frequency Ordered Stop   09/20/18 1300  meropenem (MERREM) 1 g in sodium chloride 0.9 % 100 mL IVPB     1 g 200 mL/hr over 30 Minutes Intravenous Every 12 hours 09/20/18 1254     09/18/18 1600  ceFEPIme (MAXIPIME) 2 g in sodium chloride 0.9 % 100 mL IVPB  Status:  Discontinued     2 g 200 mL/hr over 30 Minutes Intravenous Every 24 hours 09/18/18 1358 09/20/18 1254   09/18/18 1200  vancomycin (VANCOCIN) IVPB 1000 mg/200 mL premix  Status:  Discontinued     1,000 mg 200 mL/hr over 60 Minutes Intravenous Every 48 hours 09/16/18 0918 09/17/18 1151   09/17/18 1400  vancomycin (VANCOCIN) IVPB 750 mg/150 ml premix  Status:   Discontinued     750 mg 150 mL/hr over 60 Minutes Intravenous Every 24 hours 09/17/18 1151 09/19/18 1151   09/17/18 1000  ceFEPIme (MAXIPIME) 1 g in sodium chloride 0.9 % 100 mL IVPB  Status:  Discontinued     1 g 200 mL/hr over 30 Minutes Intravenous Every 24 hours 09/16/18 0918 09/18/18 1358   09/16/18 1600  metroNIDAZOLE (FLAGYL) IVPB 500 mg  Status:  Discontinued     500 mg 100 mL/hr over 60 Minutes Intravenous Every 8 hours 09/16/18 0905 09/20/18 1254   09/16/18 0915  ceFEPIme (MAXIPIME) 2 g in sodium chloride 0.9 % 100 mL IVPB  Status:  Discontinued     2 g 200 mL/hr over 30 Minutes Intravenous  Once 09/16/18 0905 09/17/18 1147   09/16/18 0915  vancomycin (VANCOCIN) IVPB 1000 mg/200 mL premix     1,000 mg 200 mL/hr over 60 Minutes Intravenous  Once 09/16/18 0905 09/16/18 1324   09/16/18 0615  metroNIDAZOLE (FLAGYL) IVPB 500 mg     500 mg 100 mL/hr over 60 Minutes Intravenous  Once 09/16/18 0607 09/16/18 0758   09/16/18 0200  cefTRIAXone (ROCEPHIN) 1 g in sodium chloride 0.9 % 100 mL IVPB     1 g 200 mL/hr over 30 Minutes Intravenous  Once 09/16/18 0146 09/16/18 0302         Subjective: Patient seen and examined at bedside.  She is sleepy, hardly wakes up on calling her name.  No overnight fever or vomiting.  Objective: Vitals:   09/23/18 0415 09/23/18 0628 09/23/18 0745 09/23/18 1209  BP: (!) 148/49 (!) 147/51 (!) 135/50 (!) 130/58  Pulse: (!) 58  (!) 55 (!) 52  Resp: 20  17   Temp: 98.4 F (36.9 C)  98.1 F (36.7 C) 98.3 F (36.8 C)  TempSrc: Oral  Axillary Oral  SpO2: 98%  97% 100%  Weight:      Height:        Intake/Output Summary (Last 24 hours) at 09/23/2018 1342 Last data filed at 09/23/2018 1328 Gross per 24 hour  Intake 1629.03 ml  Output -  Net 1629.03 ml   Filed Weights   09/20/18 0322 09/21/18 0330 09/21/18 1217  Weight: 74.5 kg 75.5 kg 75.5 kg    Examination:  General exam: Elderly female lying in bed.  Sleepy, hardly wakes up.  No  distress Respiratory system: Bilateral decreased breath sounds at bases, scattered crackles Cardiovascular system: S1 & S2 heard, bradycardic Gastrointestinal system: Abdomen is nondistended, soft and nontender. Normal bowel sounds heard. Extremities: No cyanosis, clubbing; trace edema   Data Reviewed: I have personally reviewed following labs and imaging studies  CBC: Recent Labs  Lab 09/19/18 0453 09/20/18 0706 09/21/18 0645 09/22/18 0606 09/23/18 0443  WBC 3.6* 2.1* 2.6* 1.5* 1.3*  NEUTROABS 2.8 1.7  --   --  0.4*  HGB 7.3* 8.7* 9.9* 8.2* 8.3*  HCT 23.8* 27.5* 30.8* 26.6* 27.4*  MCV 90.2 89.3 89.0 91.4 90.1  PLT 98* 117* 99* 114* 378*   Basic Metabolic Panel: Recent Labs  Lab 09/19/18 0453 09/20/18 0706 09/21/18 0645 09/22/18 0606 09/23/18 0443  NA 141 140 142 142 141  K 3.9 3.4* 4.3 4.6 4.6  CL 113* 111 109 114* 111  CO2 19* 21* 23 22 26  GLUCOSE 110* 125* 95 95 103*  BUN 27* 26* 32* 34* 25*  CREATININE 1.20* 1.11* 1.22* 0.98 0.89  CALCIUM 8.3* 8.6* 8.5* 8.2* 8.6*  MG 1.3* 1.6* 1.8 1.6* 1.9   GFR: Estimated Creatinine Clearance: 35.7 mL/min (by C-G formula based on SCr of 0.89 mg/dL). Liver Function Tests: Recent Labs  Lab 09/17/18 0445 09/20/18 0706 09/21/18 0645  AST 43* 17 20  ALT 61* 31 25  ALKPHOS 97 72 64  BILITOT 1.0 1.1 0.6  PROT 5.2* 4.9* 5.2*  ALBUMIN 2.3* 2.2* 2.2*   No results for input(s): LIPASE, AMYLASE in the last 168 hours. No results for input(s): AMMONIA in the last 168 hours. Coagulation Profile: No results for input(s): INR, PROTIME in the last 168 hours. Cardiac Enzymes: No results for input(s): CKTOTAL, CKMB, CKMBINDEX, TROPONINI in the last 168 hours. BNP (last 3 results) No results for input(s): PROBNP in the last 8760 hours. HbA1C: No results for input(s): HGBA1C in the last 72 hours. CBG: No results for input(s): GLUCAP in the last 168 hours. Lipid Profile: No results for input(s): CHOL, HDL, LDLCALC, TRIG, CHOLHDL,  LDLDIRECT in the last 72 hours. Thyroid Function Tests: No results for input(s): TSH, T4TOTAL, FREET4, T3FREE, THYROIDAB in the last 72 hours. Anemia Panel: No results for input(s): VITAMINB12, FOLATE, FERRITIN, TIBC, IRON, RETICCTPCT in the last 72 hours. Sepsis Labs: No results for input(s): PROCALCITON, LATICACIDVEN in the last 168 hours.  Recent Results (from the past 240 hour(s))  Culture, blood (Routine x 2)     Status: None   Collection Time: 09/16/18 12:55 AM  Result Value Ref Range Status   Specimen Description BLOOD BLOOD LEFT FOREARM  Final   Special Requests   Final    BOTTLES DRAWN AEROBIC AND ANAEROBIC Blood Culture adequate volume   Culture   Final    NO GROWTH 5 DAYS Performed at [facility], [address] [place], [place] [address]    Report Status 09/21/2018 FINAL  Final  Culture, blood (Routine x 2)     Status: None   Collection Time: 09/16/18 12:57 AM  Result Value Ref Range Status   Specimen Description BLOOD LEFT WRIST  Final   Special Requests   Final    BOTTLES DRAWN AEROBIC AND ANAEROBIC Blood Culture results may not be optimal due to an inadequate volume of blood received in culture bottles   Culture   Final    NO GROWTH 5 DAYS Performed at [facility], [place] [address] [place], [place] [address]    Report Status 09/21/2018 FINAL  Final  C difficile quick scan w PCR reflex     Status: None   Collection Time: 09/16/18  2:32 AM  Result Value Ref Range Status   C Diff antigen NEGATIVE NEGATIVE Final   C Diff toxin NEGATIVE NEGATIVE Final   C Diff interpretation No C. difficile detected.  Final    Comment: Performed at [facility], [place] [address] [place], [place] [address]  Culture, Urine     Status: None   Collection Time: 09/18/18  1:46 PM  Result Value Ref Range Status   Specimen Description URINE, CATHETERIZED  Final   Special Requests NONE  Final   Culture   Final    NO GROWTH Performed at [facility], [address], [place],  [address]    Report Status 09/19/2018 FINAL  Final  MRSA PCR Screening     Status: None  Collection Time: 09/19/18  1:34 PM  Result Value Ref Range Status   MRSA by PCR NEGATIVE NEGATIVE Final    Comment:        The GeneXpert MRSA Assay (FDA approved for NASAL specimens only), is one component of a comprehensive MRSA colonization surveillance program. It is not intended to diagnose MRSA infection nor to guide or monitor treatment for MRSA infections. Performed at [facility], [place] [address] [place], [place] [address]   Surgical pcr screen     Status: None   Collection Time: 09/21/18 12:07 PM  Result Value Ref Range Status   MRSA, PCR NEGATIVE NEGATIVE Final   Staphylococcus aureus NEGATIVE NEGATIVE Final    Comment: (NOTE) The Xpert SA Assay (FDA approved for NASAL specimens in patients 21 years of age and older), is one component of a comprehensive surveillance program. It is not intended to diagnose infection nor to guide or monitor treatment. Performed at [facility], [place] [address] [place], [place] [address]   Aerobic/Anaerobic Culture (surgical/deep wound)     Status: None (Preliminary result)   Collection Time: 09/21/18  2:15 PM  Result Value Ref Range Status   Specimen Description ABSCESS PERIRECTAL  Final   Special Requests NONE  Final   Gram Stain NO WBC SEEN NO ORGANISMS SEEN   Final   Culture   Final    NO GROWTH 2 DAYS Performed at [facility], [address] [place], [place] [address]    Report Status PENDING  Incomplete         Radiology Studies: No results found.      Scheduled Meds: . sodium chloride   Intravenous Once  . acetaminophen  500 mg Oral Q8H  . aspirin  81 mg Oral Daily  . cycloSPORINE  1 drop Both Eyes BID  . diltiazem  60 mg Oral Q8H  . donepezil  10 mg Oral QHS  . levothyroxine  88 mcg Oral Q24H  . metoprolol tartrate  25 mg Oral BID  . sodium chloride flush  3 mL Intravenous  Q12H   Continuous Infusions: . lactated ringers 50 mL/hr at 09/23/18 1327  . meropenem (MERREM) IV 1 g (09/23/18 0930)     LOS: 7 days        Aline August, MD [facility] Pager [phone]  If 7PM-7AM, please contact night-coverage [email or web address] Password [email or web address] 09/23/2018, 1:42 PM

## 2018-09-23 NOTE — Progress Notes (Signed)
CRITICAL VALUE ALERT  Critical Value:  Neut 0.4  Date & Time Notied:  0910  Provider Notified: Dr Starla Link  Orders Received/Actions taken: none

## 2018-09-23 NOTE — Progress Notes (Signed)
Notified by lab of critical WBC = 1.3

## 2018-09-23 NOTE — Progress Notes (Signed)
Occupational Therapy Treatment Patient Details Name: Brittany [name] MRN: [id number] DOB: 07-03-1924 Today's Date: 09/23/2018    History of present illness PAtient is a 84 y/o female with h/o dementia, myelodysplastic syndrome, shoulder pain, DJD, melanoma, admitted with fever and treated for UTI and aspiration pna.  She was hospitalized 8/30-08/18/18 with sepsis due to aspiration PNA and UTI with Enterobacter and E. Coli bacteremia. 09/21/18 I&D perirectal abscess    OT comments  Session limited by bradycardia down to HR 37 and sustaining. RN aware and present during session. Pt repositioned in the bed and family present at the end of session.   Follow Up Recommendations  SNF    Equipment Recommendations  None recommended by OT    Recommendations for Other Services Other (comment)    Precautions / Restrictions Precautions Precautions: Fall Precaution Comments: recent falls at facility        Mobility Bed Mobility Overal bed mobility: Needs Assistance Bed Mobility: Rolling Rolling: +2 for physical assistance;Mod assist         General bed mobility comments: pt total (A) to pull to the right and then log rolled to the L side. pt requires (A) to lift R LE for peri care. pt incontinent of bowel. RN addressing wound care.   Transfers                      Balance                                           ADL either performed or assessed with clinical judgement   ADL Overall ADL's : Needs assistance/impaired                                       General ADL Comments: session focused on bed level mobility. On arrival pt  noted to be bradycardia.      Vision       Perception     Praxis      Cognition Arousal/Alertness: Awake/alert Behavior During Therapy: Flat affect Overall Cognitive Status: History of cognitive impairments - at baseline                                          Exercises     Shoulder  Instructions       General Comments HR 47 on arrival and continued to 39 and sustained for several minutes. pt progressed to HR 61. Rn requesting bed level only at this time. Son arriving and educated on bradycardia    Pertinent Vitals/ Pain       Pain Assessment: Faces Faces Pain Scale: Hurts even more Pain Location: buttocks Pain Descriptors / Indicators: Grimacing;Guarding Pain Intervention(s): Repositioned  Home Living                                          Prior Functioning/Environment              Frequency  Min 2X/week        Progress Toward Goals  OT Goals(current goals can now be found in the care  plan section)  Progress towards OT goals: Not progressing toward goals - comment  Acute Rehab OT Goals Patient Stated Goal: none stated OT Goal Formulation: With patient/family Time For Goal Achievement: 10/03/18 Potential to Achieve Goals: Good ADL Goals Pt Will Perform Eating: with set-up;with supervision;sitting Pt Will Perform Grooming: with mod assist;standing Pt Will Transfer to Toilet: with mod assist;ambulating;regular height toilet;bedside commode;grab bars Pt Will Perform Toileting - Clothing Manipulation and hygiene: with mod assist;sit to/from stand  Plan Discharge plan remains appropriate    Co-evaluation                 AM-PAC PT "6 Clicks" Daily Activity     Outcome Measure   Help from another person eating meals?: A Lot Help from another person taking care of personal grooming?: A Lot Help from another person toileting, which includes using toliet, bedpan, or urinal?: A Lot Help from another person bathing (including washing, rinsing, drying)?: A Lot Help from another person to put on and taking off regular upper body clothing?: A Lot Help from another person to put on and taking off regular lower body clothing?: Total 6 Click Score: 11    End of Session Equipment Utilized During Treatment: Oxygen  OT Visit  Diagnosis: Unsteadiness on feet (R26.81)   Activity Tolerance Patient limited by pain;Patient limited by lethargy;Treatment limited secondary to medical complications (Comment)(HR bradycardia)   Patient Left in bed;with call bell/phone within reach   Nurse Communication Mobility status;Precautions        Time: 1131(1131)-1143 OT Time Calculation (min): 12 min  Charges: OT General Charges $OT Visit: 1 Visit OT Treatments $Self Care/Home Management : 8-22 mins   Jeri Modena, OTR/L  Acute Rehabilitation Services Pager: [phone] Office: [phone] .    Parke Poisson B 09/23/2018, 2:20 PM

## 2018-09-24 ENCOUNTER — Ambulatory Visit: Payer: Medicare Other

## 2018-09-24 ENCOUNTER — Other Ambulatory Visit: Payer: Medicare Other

## 2018-09-24 DIAGNOSIS — D61818 Other pancytopenia: Secondary | ICD-10-CM

## 2018-09-24 DIAGNOSIS — I10 Essential (primary) hypertension: Secondary | ICD-10-CM

## 2018-09-24 LAB — BASIC METABOLIC PANEL
Anion gap: 5 (ref 5–15)
BUN: 19 mg/dL (ref 8–23)
CALCIUM: 8.7 mg/dL — AB (ref 8.9–10.3)
CO2: 28 mmol/L (ref 22–32)
CREATININE: 0.87 mg/dL (ref 0.44–1.00)
Chloride: 109 mmol/L (ref 98–111)
GFR calc Af Amer: >60 mL/min (ref >60)
GFR, EST NON AFRICAN AMERICAN: 56 mL/min — AB (ref >60)
GLUCOSE: 101 mg/dL — AB (ref 70–99)
POTASSIUM: 4.7 mmol/L (ref 3.5–5.1)
SODIUM: 142 mmol/L (ref 135–145)

## 2018-09-24 LAB — CBC
HCT: 29.5 % — ABNORMAL LOW (ref 36.0–46.0)
HEMOGLOBIN: 9.2 g/dL — AB (ref 12.0–15.0)
MCH: 28.2 pg (ref 26.0–34.0)
MCHC: 31.2 g/dL (ref 30.0–36.0)
MCV: 90.5 fL (ref 80.0–100.0)
PLATELETS: 150 10*3/uL (ref 150–400)
RBC: 3.26 MIL/uL — AB (ref 3.87–5.11)
RDW: 17.3 % — ABNORMAL HIGH (ref 11.5–15.5)
WBC: 1.4 10*3/uL — CL (ref 4.0–10.5)
nRBC: 0 % (ref 0.0–0.2)

## 2018-09-24 LAB — MAGNESIUM: MAGNESIUM: 1.5 mg/dL — AB (ref 1.7–2.4)

## 2018-09-24 LAB — C-REACTIVE PROTEIN: CRP: 7.6 mg/dL — ABNORMAL HIGH (ref <1.0)

## 2018-09-24 MED ORDER — DILTIAZEM HCL 60 MG PO TABS
60.0000 mg | ORAL_TABLET | Freq: Three times a day (TID) | ORAL | Status: DC
  Administered 2018-09-24 – 2018-09-25 (×2): 60 mg via ORAL
  Filled 2018-09-24 (×3): qty 1

## 2018-09-24 MED ORDER — SODIUM CHLORIDE 0.9 % IV SOLN
INTRAVENOUS | Status: DC | PRN
  Administered 2018-09-24 – 2018-09-25 (×2): 10 mL via INTRAVENOUS

## 2018-09-24 MED ORDER — MAGNESIUM SULFATE 2 GM/50ML IV SOLN
2.0000 g | Freq: Once | INTRAVENOUS | Status: AC
  Administered 2018-09-24: 2 g via INTRAVENOUS
  Filled 2018-09-24: qty 50

## 2018-09-24 NOTE — Progress Notes (Signed)
Patient ID: Brittany [name], female   DOB: 10-Nov-1924, 84 y.o.   MRN: [id number]  PROGRESS NOTE    Brittany [name]  ZCH:[id number] DOB: Nov 20, 1924 DOA: 09/16/2018 PCP: Blanchie Serve, MD   Brief Narrative:  84 year old female with history of dementia, myelodysplasia, hypothyroidism, hypertension, hyperlipidemia, chronic kidney disease stage III, CVA in 2013 presented on 09/16/2018 with fever and altered mental status.  CT of the abdomen and pelvis showed probable acute distal sigmoid diverticulitis and chest x-ray showed right lower lobe probable infiltrates.  Patient was started on intravenous antibiotics.  ID was consulted.  Patient had persistent fevers.  Patient had repeat CT of the abdomen which showed no evidence of abscess but there was stable inflammation in the right gluteal cleft.  Antibiotics was changed to meropenem.  General surgery was consulted.  Cardiology was also consulted for A. fib with RVR and patient was started on Cardizem drip which was subsequently switched to oral Cardizem.  Patient had I&D done of the left gluteal abscess on 09/21/2018.  Palliative care was also consulted.  Hematology was also consulted for pancytopenia.   Assessment & Plan:   Principal Problem:   Perirectal inflammation Active Problems:   Myelodysplastic syndrome (HCC)   Hypothyroidism   Essential hypertension   Vascular dementia without behavioral disturbance (HCC)   CKD (chronic kidney disease) stage 3, GFR 30-59 ml/min (HCC)   AKI (acute kidney injury) ([place])   Diverticulitis   Fever   Sepsis with acute renal failure without septic shock (HCC)   Palliative care encounter   Sepsis secondary to left gluteal cellulitis/abscess -Antibiotic plan as below  Left gluteal cellulitis/abscess -Status post I&D on 09/21/2018 by general surgery.  Wound care as per general surgery recommendations.  No growth from the OR cultures -Continue meropenem as per ID  Pancytopenia in the setting of MDS -Oncology  following -WBC pending for today.  Communicated with Dr. Gorsuch/oncology on 09/23/18 on phone and will avoid G-CSF unless patient is septic or spiking temperatures -Monitor.  No signs of bleeding   Paroxysmal atrial fibrillation -Cardiology following.  Cardizem drip has been switched to oral Cardizem.  Continue metoprolol.  Currently patient has sinus bradycardia; hold Cardizem dose if heart rate <60 -Not a good candidate for anticoagulation as per cardiology  Hypomagnesemia -Replace. Repeat AM labs  AKI on chronic kidney disease stage III -Resolved.  Monitor.  Losartan on hold  History of CVA with vascular dementia -Continue aspirin and donepezil.  Patient's baseline mental status is apparently she recognizes her sons  Hypertension -Monitor blood pressure.  Off losartan  Hypothyroidism -Continue Synthroid  Failure to thrive/generalized deconditioning -Overall prognosis is guarded to poor.  Palliative care following and planning for family conference call today. -Recommend hospice/comfort measures   DVT prophylaxis: No Lovenox because of thrombocytopenia Code Status: DNR Family Communication: None at bedside Disposition Plan: Depends on clinical outcome  Consultants: ID/general surgery/hematology/cardiology/palliative care  Procedures: I&D of right gluteal abscess on 09/21/2018  Antimicrobials:  Anti-infectives (From admission, onward)   Start     Dose/Rate Route Frequency Ordered Stop   09/20/18 1300  meropenem (MERREM) 1 g in sodium chloride 0.9 % 100 mL IVPB     1 g 200 mL/hr over 30 Minutes Intravenous Every 12 hours 09/20/18 1254     09/18/18 1600  ceFEPIme (MAXIPIME) 2 g in sodium chloride 0.9 % 100 mL IVPB  Status:  Discontinued     2 g 200 mL/hr over 30 Minutes Intravenous Every 24 hours 09/18/18 1358 09/20/18 1254  09/18/18 1200  vancomycin (VANCOCIN) IVPB 1000 mg/200 mL premix  Status:  Discontinued     1,000 mg 200 mL/hr over 60 Minutes Intravenous Every 48  hours 09/16/18 0918 09/17/18 1151   09/17/18 1400  vancomycin (VANCOCIN) IVPB 750 mg/150 ml premix  Status:  Discontinued     750 mg 150 mL/hr over 60 Minutes Intravenous Every 24 hours 09/17/18 1151 09/19/18 1151   09/17/18 1000  ceFEPIme (MAXIPIME) 1 g in sodium chloride 0.9 % 100 mL IVPB  Status:  Discontinued     1 g 200 mL/hr over 30 Minutes Intravenous Every 24 hours 09/16/18 0918 09/18/18 1358   09/16/18 1600  metroNIDAZOLE (FLAGYL) IVPB 500 mg  Status:  Discontinued     500 mg 100 mL/hr over 60 Minutes Intravenous Every 8 hours 09/16/18 0905 09/20/18 1254   09/16/18 0915  ceFEPIme (MAXIPIME) 2 g in sodium chloride 0.9 % 100 mL IVPB  Status:  Discontinued     2 g 200 mL/hr over 30 Minutes Intravenous  Once 09/16/18 0905 09/17/18 1147   09/16/18 0915  vancomycin (VANCOCIN) IVPB 1000 mg/200 mL premix     1,000 mg 200 mL/hr over 60 Minutes Intravenous  Once 09/16/18 0905 09/16/18 1324   09/16/18 0615  metroNIDAZOLE (FLAGYL) IVPB 500 mg     500 mg 100 mL/hr over 60 Minutes Intravenous  Once 09/16/18 0607 09/16/18 0758   09/16/18 0200  cefTRIAXone (ROCEPHIN) 1 g in sodium chloride 0.9 % 100 mL IVPB     1 g 200 mL/hr over 30 Minutes Intravenous  Once 09/16/18 0146 09/16/18 0302          Subjective: Patient seen and examined at bedside. She is sleepy and hardly wakes up on calling her name. No overnight vomiting or fever. Objective: Vitals:   09/23/18 2009 09/23/18 2205 09/24/18 0030 09/24/18 0607  BP: (!) 143/46  (!) 153/57 (!) 160/48  Pulse: (!) 55 69 60 (!) 58  Resp:   (!) 27 20  Temp: (!) 97.3 F (36.3 C)  97.9 F (36.6 C) (!) 97.3 F (36.3 C)  TempSrc: Oral  Oral Oral  SpO2:   96% 96%  Weight:      Height:        Intake/Output Summary (Last 24 hours) at 09/24/2018 0753 Last data filed at 09/23/2018 1901 Gross per 24 hour  Intake 460 ml  Output -  Net 460 ml   Filed Weights   09/20/18 0322 09/21/18 0330 09/21/18 1217  Weight: 74.5 kg 75.5 kg 75.5 kg     Examination:  General exam: Elderly female lying in bed.  Sleepy, hardly wakes up.  No acute distress Respiratory system: Bilateral decreased breath sounds at bases, scattered crackles; no wheezing Cardiovascular system: S1 & S2 heard, still bradycardic Gastrointestinal system: Abdomen is nondistended, soft and nontender. Normal bowel sounds heard. Extremities: No cyanosis; trace edema   Data Reviewed: I have personally reviewed following labs and imaging studies  CBC: Recent Labs  Lab 09/19/18 0453 09/20/18 0706 09/21/18 0645 09/22/18 0606 09/23/18 0443  WBC 3.6* 2.1* 2.6* 1.5* 1.3*  NEUTROABS 2.8 1.7  --   --  0.4*  HGB 7.3* 8.7* 9.9* 8.2* 8.3*  HCT 23.8* 27.5* 30.8* 26.6* 27.4*  MCV 90.2 89.3 89.0 91.4 90.1  PLT 98* 117* 99* 114* 161*   Basic Metabolic Panel: Recent Labs  Lab 09/20/18 0706 09/21/18 0645 09/22/18 0606 09/23/18 0443 09/24/18 0330  NA 140 142 142 141 142  K 3.4* 4.3 4.6  4.6 4.7  CL 111 109 114* 111 109  CO2 21* 23 22 26 28   GLUCOSE 125* 95 95 103* 101*  BUN 26* 32* 34* 25* 19  CREATININE 1.11* 1.22* 0.98 0.89 0.87  CALCIUM 8.6* 8.5* 8.2* 8.6* 8.7*  MG 1.6* 1.8 1.6* 1.9 1.5*   GFR: Estimated Creatinine Clearance: 36.5 mL/min (by C-G formula based on SCr of 0.87 mg/dL). Liver Function Tests: Recent Labs  Lab 09/20/18 0706 09/21/18 0645  AST 17 20  ALT 31 25  ALKPHOS 72 64  BILITOT 1.1 0.6  PROT 4.9* 5.2*  ALBUMIN 2.2* 2.2*   No results for input(s): LIPASE, AMYLASE in the last 168 hours. No results for input(s): AMMONIA in the last 168 hours. Coagulation Profile: No results for input(s): INR, PROTIME in the last 168 hours. Cardiac Enzymes: No results for input(s): CKTOTAL, CKMB, CKMBINDEX, TROPONINI in the last 168 hours. BNP (last 3 results) No results for input(s): PROBNP in the last 8760 hours. HbA1C: No results for input(s): HGBA1C in the last 72 hours. CBG: No results for input(s): GLUCAP in the last 168 hours. Lipid  Profile: No results for input(s): CHOL, HDL, LDLCALC, TRIG, CHOLHDL, LDLDIRECT in the last 72 hours. Thyroid Function Tests: No results for input(s): TSH, T4TOTAL, FREET4, T3FREE, THYROIDAB in the last 72 hours. Anemia Panel: No results for input(s): VITAMINB12, FOLATE, FERRITIN, TIBC, IRON, RETICCTPCT in the last 72 hours. Sepsis Labs: No results for input(s): PROCALCITON, LATICACIDVEN in the last 168 hours.  Recent Results (from the past 240 hour(s))  Culture, blood (Routine x 2)     Status: None   Collection Time: 09/16/18 12:55 AM  Result Value Ref Range Status   Specimen Description BLOOD BLOOD LEFT FOREARM  Final   Special Requests   Final    BOTTLES DRAWN AEROBIC AND ANAEROBIC Blood Culture adequate volume   Culture   Final    NO GROWTH 5 DAYS Performed at [facility], [address], [place], [place] [address]    Report Status 09/21/2018 FINAL  Final  Culture, blood (Routine x 2)     Status: None   Collection Time: 09/16/18 12:57 AM  Result Value Ref Range Status   Specimen Description BLOOD LEFT WRIST  Final   Special Requests   Final    BOTTLES DRAWN AEROBIC AND ANAEROBIC Blood Culture results may not be optimal due to an inadequate volume of blood received in culture bottles   Culture   Final    NO GROWTH 5 DAYS Performed at [facility], [place] [address] [place], [place] [address]    Report Status 09/21/2018 FINAL  Final  C difficile quick scan w PCR reflex     Status: None   Collection Time: 09/16/18  2:32 AM  Result Value Ref Range Status   C Diff antigen NEGATIVE NEGATIVE Final   C Diff toxin NEGATIVE NEGATIVE Final   C Diff interpretation No C. difficile detected.  Final    Comment: Performed at [facility], [place] [address] [place], [place] [address]  Culture, Urine     Status: None   Collection Time: 09/18/18  1:46 PM  Result Value Ref Range Status   Specimen Description URINE, CATHETERIZED  Final   Special Requests NONE  Final   Culture    Final    NO GROWTH Performed at [facility], [address], [place], [place] [address]    Report Status 09/19/2018 FINAL  Final  MRSA  PCR Screening     Status: None   Collection Time: 09/19/18  1:34 PM  Result Value Ref Range Status   MRSA by PCR NEGATIVE NEGATIVE Final    Comment:        The GeneXpert MRSA Assay (FDA approved for NASAL specimens only), is one component of a comprehensive MRSA colonization surveillance program. It is not intended to diagnose MRSA infection nor to guide or monitor treatment for MRSA infections. Performed at [facility], [place] [address], [place], [place] [address]   Surgical pcr screen     Status: None   Collection Time: 09/21/18 12:07 PM  Result Value Ref Range Status   MRSA, PCR NEGATIVE NEGATIVE Final   Staphylococcus aureus NEGATIVE NEGATIVE Final    Comment: (NOTE) The Xpert SA Assay (FDA approved for NASAL specimens in patients 79 years of age and older), is one component of a comprehensive surveillance program. It is not intended to diagnose infection nor to guide or monitor treatment. Performed at [facility], [place] [address] [place], [place] [address]   Aerobic/Anaerobic Culture (surgical/deep wound)     Status: None (Preliminary result)   Collection Time: 09/21/18  2:15 PM  Result Value Ref Range Status   Specimen Description ABSCESS PERIRECTAL  Final   Special Requests NONE  Final   Gram Stain NO WBC SEEN NO ORGANISMS SEEN   Final   Culture   Final    NO GROWTH 2 DAYS NO ANAEROBES ISOLATED; CULTURE IN PROGRESS FOR 5 DAYS Performed at [facility], [place] [address] [place], [place] [address]    Report Status PENDING  Incomplete         Radiology Studies: No results found.      Scheduled Meds: . sodium chloride   Intravenous Once  . acetaminophen  500 mg Oral Q8H  . aspirin  81 mg Oral Daily  . cycloSPORINE  1 drop Both Eyes BID  . diltiazem  60 mg Oral Q8H  . donepezil  10 mg Oral  QHS  . levothyroxine  88 mcg Oral Q24H  . metoprolol tartrate  25 mg Oral BID  . sodium chloride flush  3 mL Intravenous Q12H   Continuous Infusions: . lactated ringers Stopped (09/24/18 0615)  . magnesium sulfate 1 - 4 g bolus IVPB    . meropenem (MERREM) IV 1 g (09/23/18 2202)     LOS: 8 days        Aline August, MD [facility] Pager [phone]  If 7PM-7AM, please contact night-coverage [email or web address] Password [email or web address] 09/24/2018, 7:53 AM

## 2018-09-24 NOTE — Progress Notes (Signed)
[facility] for Infectious Disease  Date of Admission:  09/16/2018   Total days of antibiotics 8        Day 3 merrem           Patient ID: Brittany [name] is a 84 y.o. F  Principal Problem:   Perirectal inflammation Active Problems:   Fever   Myelodysplastic syndrome (HCC)   Hypothyroidism   Essential hypertension   Vascular dementia without behavioral disturbance (HCC)   CKD (chronic kidney disease) stage 3, GFR 30-59 ml/min (HCC)   AKI (acute kidney injury) ([place])   Diverticulitis   Sepsis with acute renal failure without septic shock ([place])   Palliative care encounter   Pancytopenia ([place])   . sodium chloride   Intravenous Once  . acetaminophen  500 mg Oral Q8H  . aspirin  81 mg Oral Daily  . cycloSPORINE  1 drop Both Eyes BID  . diltiazem  60 mg Oral Q8H  . donepezil  10 mg Oral QHS  . levothyroxine  88 mcg Oral Q24H  . metoprolol tartrate  25 mg Oral BID  . sodium chloride flush  3 mL Intravenous Q12H    SUBJECTIVE: Feeling well today. No pain fevers or chills. She is not certain what brought her to the hospital in the first place. Her son stepped out for lunch.   Allergies  Allergen Reactions  . Lisinopril     REACTION: angioedema  . Penicillins     REACTION: rash    OBJECTIVE: Vitals:   09/24/18 0607 09/24/18 0754 09/24/18 1019 09/24/18 1119  BP: (!) 160/48 (!) 157/81 (!) 142/66 (!) 147/43  Pulse: (!) 58 67 60 (!) 55  Resp: 20 (!) 22  (!) 26  Temp: (!) 97.3 F (36.3 C) 98 F (36.7 C)  98 F (36.7 C)  TempSrc: Oral Oral  Oral  SpO2: 96% 98%  99%  Weight:      Height:       Body mass index is 33.6 kg/m.  Physical Exam  Constitutional: She is oriented to person, place, and time. She appears well-developed and well-nourished.  Resting comfortably in bed. Smiling.   HENT:  Mouth/Throat: Mucous membranes are normal. No oral lesions. Normal dentition. No dental abscesses. No oropharyngeal exudate.  Cardiovascular: Regular rhythm and  normal heart sounds.  bradycardic  Pulmonary/Chest: Effort normal and breath sounds normal.  Abdominal: Soft. She exhibits no distension. There is no tenderness.  Musculoskeletal: She exhibits no edema.  Neurological: She is alert and oriented to person, place, and time.  Skin: Skin is warm and dry. No rash noted. There is pallor.  Psychiatric: She has a normal mood and affect. Judgment normal.  Nursing note and vitals reviewed. Deferred peri-rectal evaluation   Lab Results Lab Results  Component Value Date   WBC 1.4 (LL) 09/24/2018   HGB 9.2 (L) 09/24/2018   HCT 29.5 (L) 09/24/2018   MCV 90.5 09/24/2018   PLT 150 09/24/2018    Lab Results  Component Value Date   CREATININE 0.87 09/24/2018   BUN 19 09/24/2018   NA 142 09/24/2018   K 4.7 09/24/2018   CL 109 09/24/2018   CO2 28 09/24/2018    Lab Results  Component Value Date   ALT 25 09/21/2018   AST 20 09/21/2018   ALKPHOS 64 09/21/2018   BILITOT 0.6 09/21/2018     Microbiology: Recent Results (from the past 240 hour(s))  Culture, blood (Routine x 2)  Status: None   Collection Time: 09/16/18 12:55 AM  Result Value Ref Range Status   Specimen Description BLOOD BLOOD LEFT FOREARM  Final   Special Requests   Final    BOTTLES DRAWN AEROBIC AND ANAEROBIC Blood Culture adequate volume   Culture   Final    NO GROWTH 5 DAYS Performed at [facility], [address] [place], [place] [address]    Report Status 09/21/2018 FINAL  Final  Culture, blood (Routine x 2)     Status: None   Collection Time: 09/16/18 12:57 AM  Result Value Ref Range Status   Specimen Description BLOOD LEFT WRIST  Final   Special Requests   Final    BOTTLES DRAWN AEROBIC AND ANAEROBIC Blood Culture results may not be optimal due to an inadequate volume of blood received in culture bottles   Culture   Final    NO GROWTH 5 DAYS Performed at [facility], [place] [address] [place], [place] [address]    Report Status 09/21/2018 FINAL   Final  C difficile quick scan w PCR reflex     Status: None   Collection Time: 09/16/18  2:32 AM  Result Value Ref Range Status   C Diff antigen NEGATIVE NEGATIVE Final   C Diff toxin NEGATIVE NEGATIVE Final   C Diff interpretation No C. difficile detected.  Final    Comment: Performed at [facility], [place] [address], [place], [place] [address]  Culture, Urine     Status: None   Collection Time: 09/18/18  1:46 PM  Result Value Ref Range Status   Specimen Description URINE, CATHETERIZED  Final   Special Requests NONE  Final   Culture   Final    NO GROWTH Performed at [facility], [address], [place], [place] [address]    Report Status 09/19/2018 FINAL  Final  MRSA PCR Screening     Status: None   Collection Time: 09/19/18  1:34 PM  Result Value Ref Range Status   MRSA by PCR NEGATIVE NEGATIVE Final    Comment:        The GeneXpert MRSA Assay (FDA approved for NASAL specimens only), is one component of a comprehensive MRSA colonization surveillance program. It is not intended to diagnose MRSA infection nor to guide or monitor treatment for MRSA infections. Performed at [facility], [place] [address], [place],  [address]   Surgical pcr screen     Status: None   Collection Time: 09/21/18 12:07 PM  Result Value Ref Range Status   MRSA, PCR NEGATIVE NEGATIVE Final   Staphylococcus aureus NEGATIVE NEGATIVE Final    Comment: (NOTE) The Xpert SA Assay (FDA approved for NASAL specimens in patients 71 years of age and older), is one component of a comprehensive surveillance program. It is not intended to diagnose infection nor to guide or monitor treatment. Performed at [facility], [place] [address], [place],  [address]   Aerobic/Anaerobic Culture (surgical/deep wound)     Status: None (Preliminary result)   Collection Time: 09/21/18  2:15 PM  Result Value Ref Range Status   Specimen Description ABSCESS PERIRECTAL  Final   Special  Requests NONE  Final   Gram Stain NO WBC SEEN NO ORGANISMS SEEN   Final   Culture   Final    NO GROWTH 3 DAYS NO ANAEROBES ISOLATED; CULTURE IN PROGRESS FOR 5 DAYS Performed at [facility], [place]  [address] [place], [place] [address]    Report Status PENDING  Incomplete   Assessment:  Ms. Straka feels well now POD 3 following peri-rectal abscess evacuation. She has had no further fevers since changing abx to meropenem and having surgery. No growth noted on intraop cultures. It is hard to know if she has a resistant organism contributing or if she just needed surgery to clear infection. For now will continue meropenem.   Palliative care conversations with her and her family noted to be ongoing with formal meeting today.   She has a h/o myeloproliferative condition dating back to 2008 from chart review previously requiring darbepoetin inj for anemia (none needed since 2017). Heme/onc has been notified of her pancytopenia per primary team. Seems that her counts started falling 48h prior to changing to meropenem - not certain if abx are contributing or overall condition.   Plan:  1. Continue meropenem for now  2. Palliative care conversations to be noted  3. Wound care per surgery team  4. WBC stable this morning with smear in process   [name], MSN, NP-C [facility] [place] Cell: [phone] Pager: [phone]  09/24/2018  12:33 PM

## 2018-09-24 NOTE — Progress Notes (Signed)
  Speech Language Pathology Treatment: Dysphagia  Patient Details Name: Brittany [name] MRN: [id number] DOB: 14-Jun-1924 Today's Date: 09/24/2018 Time: 6886-4847 SLP Time Calculation (min) (ACUTE ONLY): 13 min  Assessment / Plan / Recommendation Clinical Impression  RN repositioning pt as SLP arrived for meds crushed in puree. [name] positioning interferes with function each time therapist has observed pt . She has scoliosis and always scoots herself low in bed following each repositioning attempt. Immediate cough following 5 consecutive straw sips juice. S/S aspiration faded to mild throat clearing x 1 following verbal cues for smaller sips. Palliative care meeting planned for today. SLP recommends pt continue Dys 3 (for energy conservation) and thin liquids in most upright position possible, up to chair would be best if possible, crush pills and small sips thin. ST will sign off at this time. Please reconsult if questions or concerns.     HPI HPI: Pt is a 84 y.o. female with medical history significant of dementia (?baseline oriented to self); myelodysplasia; hypothyroidism; HTN; HLD; stage 3 CKD; and h/o CVA (2013) presenting with fever and AMS. CXR concerning for RLL PNA. CT abdomen suspiscious for acute distal sigmoid diverticulitis. BSE 08/15/18 recommended NPO due to mentation, but on next date SLP reassessed and recommended regular diet and thin liquids as swallow appeared functional.       SLP Plan  All goals met;Discharge SLP treatment due to (comment)       Recommendations  Diet recommendations: Dysphagia 3 (mechanical soft);Thin liquid Liquids provided via: Cup;Straw Medication Administration: Crushed with puree Supervision: Staff to assist with self feeding;Full supervision/cueing for compensatory strategies Compensations: Slow rate;Small sips/bites;Minimize environmental distractions Postural Changes and/or Swallow Maneuvers: Seated upright 90 degrees                Oral Care  Recommendations: Oral care BID Follow up Recommendations: Skilled Nursing facility SLP Visit Diagnosis: Dysphagia, oropharyngeal phase (R13.12) Plan: All goals met;Discharge SLP treatment due to (comment)       GO                Houston Siren 09/24/2018, 8:36 AM  Orbie Pyo Colvin Caroli.Ed Risk analyst [phone] Office [phone]

## 2018-09-24 NOTE — Care Management Note (Addendum)
Case Management Note  Patient Details  Name: Brittany [name] MRN: [id number] Date of Birth: [birth year]  Subjective/Objective:  Pt presented for AMS- PTA from [place] will be to return to SNF with Palliative Services. CSW following for disposition needs.                   Action/Plan: CM will continue to monitor as well.   Expected Discharge Date:                  Expected Discharge Plan:  Skilled Nursing Facility  In-House Referral:  Clinical Social Work  Discharge planning Services  CM Consult  Post Acute Care Choice:  NA Choice offered to:  NA  DME Arranged:  N/A DME Agency:  NA  HH Arranged:  NA HH Agency:  NA  Status of Service:  Completed, signed off  If discussed at [place] of Stay Meetings, dates discussed:    Additional Comments: 09-25-18 0959 Jacqlyn Krauss, RN,BSN [phone] PICC line to be placed 09-25-18. 1 x hydrotherapy today and then d/c to facility. Bethena Roys, RN 09/24/2018, 3:02 PM

## 2018-09-24 NOTE — Progress Notes (Addendum)
Daily Progress Note   Patient Name: Brittany [name]       Date: 09/24/2018 DOB: October 05, 1924  Age: 84 y.o. MRN#: [id number] Attending Physician: Aline August, MD Primary Care Physician: Blanchie Serve, MD Admit Date: 09/16/2018  Reason for Consultation/Follow-up: Establishing goals of care and Psychosocial/spiritual support  Subjective: 67 min meeting was had with family.  Initially we met at bedside and I examined Brittany [name].  Then we went to a conference room with a speaker phone and added on family in [place] and [place].  In the meeting were Gaspar Cola, and on the phone were Jan (retired anesthesiologist), Shanon Brow and Caren Griffins.  We discussed her current health and hospitalization.  Then we discussed her past medical history and gradual decline with emphasis on the CKD, nutritional status, recurrent infections, and myeloproliferative disorder with dysplasia.  The family is aware of the potential to convert to Leukemia and have a rapid death.  We also discussed the step wise functional decline the family has witnessed with each hospitalization.  I reviewed Dr. Calton Dach recommendations from 10/7 note with them.  We celebrated the fact the Brittany [name] appears more alert and "with it" today.  The family opted to return to [facility] (SNF) with Palliative to follow.  We then discussed her current Health Care Directive.  Brittany [name] expressed concern.  Her husband died under hospice care and she felt it was wonderful.  However she feels that the doctors (in [place]) were too aggressive with her husband.  The doctors had placed him on a morphine drip prior to talking with [facility] about it.  Jan (retired Magazine features editor) spoke to the doctors, stopped the morphine gtt and allowed for  Brittany [name]'s husband to go home with hospice care.  Brittany [name] expressed that the 3 weeks on hospice care were the best weeks of her marriage and she was so thankful for that time.  She wanted reassurance that the doctors would not aggressively push comfort measures on her mother before the family was ready.  I assured her that we would not.  Family then discussed how to give information about next steps to their mother.  Assessment: Fragile, 84 yo female, improved today but unlikely to rehab well.  At high risk for readmission.   Patient Profile/HPI:  84 y.o.femalewith past medical history of CVA,  vascular dementia, myelodysplasia, recurrent UTI, and recent bacteremiawho was admitted from [facility] 10/2/2019with sepsis. Work up revealed cellulitis of the gluteal cleft which could have caused the sepsis. Plan is for I&D today. Hospitalization has been complicated by afib with RVR, and a drop in hgb to 6.4.Palliative Medicine consultation was requested by the family in order to address the patient's overall health picture and anticipatory needs.     Length of Stay: 8  Current Medications: Scheduled Meds:  . sodium chloride   Intravenous Once  . acetaminophen  500 mg Oral Q8H  . aspirin  81 mg Oral Daily  . cycloSPORINE  1 drop Both Eyes BID  . diltiazem  60 mg Oral Q8H  . donepezil  10 mg Oral QHS  . levothyroxine  88 mcg Oral Q24H  . metoprolol tartrate  25 mg Oral BID  . sodium chloride flush  3 mL Intravenous Q12H    Continuous Infusions: . sodium chloride Stopped (09/24/18 1159)  . lactated ringers 50 mL/hr at 09/24/18 1200  . meropenem (MERREM) IV 1 g (09/24/18 1013)    PRN Meds: sodium chloride, albuterol, guaiFENesin, hydrALAZINE, ondansetron **OR** ondansetron (ZOFRAN) IV, polyvinyl alcohol, traMADol  Physical Exam        Elderly frail female, awake, responsive, lying in bed. Pleasantly demented. CV brady Resp mild to mod increased work of breathing. Abdomen  soft, nt, nd    Vital Signs: BP (!) 147/43 (BP Location: Left Arm)   Pulse (!) 55   Temp 98 F (36.7 C) (Oral)   Resp (!) 26   Ht 4' 11.02" (1.499 m)   Wt 75.5 kg   SpO2 99%   BMI 33.60 kg/m  SpO2: SpO2: 99 % O2 Device: O2 Device: Nasal Cannula O2 Flow Rate: O2 Flow Rate (L/min): 2 L/min  Intake/output summary:   Intake/Output Summary (Last 24 hours) at 09/24/2018 1435 Last data filed at 09/24/2018 1217 Gross per 24 hour  Intake 460 ml  Output -  Net 460 ml   LBM: Last BM Date: 09/23/18(per chart) Baseline Weight: Weight: 69.6 kg Most recent weight: Weight: 75.5 kg       Palliative Assessment/Data: 30%    Flowsheet Rows     Most Recent Value  Intake Tab  Referral Department  Hospitalist  Unit at Time of Referral  Cardiac/Telemetry Unit  Palliative Care Primary Diagnosis  Sepsis/Infectious Disease  Date Notified  09/20/18  Palliative Care Type  New Palliative care  Reason for referral  Clarify Goals of Care  Date of Admission  09/16/18  Date first seen by Palliative Care  09/21/18  # of days Palliative referral response time  1 Day(s)  # of days IP prior to Palliative referral  4  Clinical Assessment  Psychosocial & Spiritual Assessment  Palliative Care Outcomes      Patient Active Problem List   Diagnosis Date Noted  . Pancytopenia ([place]) 09/24/2018  . Sepsis with acute renal failure without septic shock ([place])   . Palliative care encounter   . Perirectal inflammation 09/20/2018  . Fever 09/18/2018  . Sepsis secondary to UTI ([place]) 09/16/2018  . Diverticulitis 09/16/2018  . Aspiration pneumonia of right lower lobe ([place]) 08/19/2018  . AKI (acute kidney injury) ([place])   . Fatigue 07/28/2018  . Weight loss 07/28/2018  . Actinic keratosis 06/26/2018  . Diastolic CHF ([place]) 16/09/9603  . History of CVA (cerebrovascular accident) 08/08/2017  . CKD (chronic kidney disease) stage 3, GFR 30-59 ml/min (HCC) 08/08/2017  . Anemia,  iron deficiency 01/11/2016  .  Unstable gait   . Shoulder pain   . History of melanoma 03/20/2015  . Constipation 07/18/2014  . Vascular dementia without behavioral disturbance ([place]) 11/11/2012  . Stroke ([place]) 12/17/2011  . Back pain 04/23/2011  . COLONIC POLYPS, HX OF 06/05/2009  . Myelodysplastic syndrome ([place]) 01/02/2009  . Hypothyroidism 08/21/2007  . Hyperlipidemia 08/21/2007  . Essential hypertension 08/21/2007  . DIVERTICULOSIS, COLON 08/21/2007  . OSTEOPOROSIS 08/21/2007  . SKIN CANCER, HX OF 08/21/2007    Palliative Care Plan    Recommendations/Plan:  Return to SNF with Palliative to follow.  Family with touch base with Dr. Alvy Bimler in 1-2 weeks to determine whether or not to continue hematology treatments.  Family aware of potential for continued decline and possibly even rapid death.  Goals of Care and Additional Recommendations:  Limitations on Scope of Treatment: Full Scope Treatment  Code Status:  DNR  Prognosis:   < 6 months she is at high risk for an acute life ending event.  Recurrent infections, immobility, pancytopenia due to myelodysplastic disorder and CKD.   Discharge Planning:  [facility] for rehab with Palliative care service follow-up  Care plan was discussed with family.  Thank you for allowing the Palliative Medicine Team to assist in the care of this patient.  Total time spent:  80 min.     Greater than 50%  of this time was spent counseling and coordinating care related to the above assessment and plan.  Florentina Jenny, PA-C Palliative Medicine  Please contact Palliative MedicineTeam phone at [phone] for questions and concerns between 7 am - 7 pm.   Please see AMION for individual provider pager numbers.

## 2018-09-24 NOTE — Progress Notes (Signed)
CSW met with patient's daughter, Jacqlyn Larsen, at bedside. Patient asleep. CSW and Jacqlyn Larsen discussed palliative meeting outcomes and confirmed plan for patient to return to [facility] with palliative following.   CSW updated [place] at [facility]. Facility obtained patient's [facility] authorization earlier this week, she will confirm if auth still valid. Also confirming if facility can take patient while still on IV merrem; if so, facility will need to know early in the morning on day of discharge so they can order from their pharmacy.   CSW to follow and support with discharge planning.  Estanislado Emms, [place]

## 2018-09-25 ENCOUNTER — Inpatient Hospital Stay: Payer: Self-pay

## 2018-09-25 DIAGNOSIS — R001 Bradycardia, unspecified: Secondary | ICD-10-CM

## 2018-09-25 DIAGNOSIS — R32 Unspecified urinary incontinence: Secondary | ICD-10-CM

## 2018-09-25 DIAGNOSIS — R231 Pallor: Secondary | ICD-10-CM

## 2018-09-25 DIAGNOSIS — L249 Irritant contact dermatitis, unspecified cause: Secondary | ICD-10-CM

## 2018-09-25 DIAGNOSIS — B372 Candidiasis of skin and nail: Secondary | ICD-10-CM

## 2018-09-25 LAB — BASIC METABOLIC PANEL
Anion gap: 6 (ref 5–15)
BUN: 14 mg/dL (ref 8–23)
CHLORIDE: 105 mmol/L (ref 98–111)
CO2: 30 mmol/L (ref 22–32)
CREATININE: 0.86 mg/dL (ref 0.44–1.00)
Calcium: 8.8 mg/dL — ABNORMAL LOW (ref 8.9–10.3)
GFR calc Af Amer: >60 mL/min (ref >60)
GFR calc non Af Amer: 57 mL/min — ABNORMAL LOW (ref >60)
Glucose, Bld: 89 mg/dL (ref 70–99)
Potassium: 4.6 mmol/L (ref 3.5–5.1)
SODIUM: 141 mmol/L (ref 135–145)

## 2018-09-25 LAB — MAGNESIUM: MAGNESIUM: 1.7 mg/dL (ref 1.7–2.4)

## 2018-09-25 LAB — PATHOLOGIST SMEAR REVIEW

## 2018-09-25 MED ORDER — LEVOTHYROXINE SODIUM 88 MCG PO TABS: 88.0000 ug | ORAL_TABLET | Freq: Every day | ORAL | Status: AC

## 2018-09-25 MED ORDER — METOPROLOL TARTRATE 25 MG PO TABS: 25.0000 mg | ORAL_TABLET | Freq: Two times a day (BID) | ORAL | 0 refills | Status: DC

## 2018-09-25 MED ORDER — MEROPENEM IV (FOR PTA / DISCHARGE USE ONLY): 1.0000 g | Freq: Two times a day (BID) | INTRAVENOUS | 0 refills | Status: AC

## 2018-09-25 MED ORDER — SODIUM CHLORIDE 0.9 % IV SOLN: 1.0000 g | Freq: Two times a day (BID) | INTRAVENOUS | 0 refills | Status: DC

## 2018-09-25 MED ORDER — DILTIAZEM HCL 30 MG PO TABS: 30.0000 mg | ORAL_TABLET | Freq: Three times a day (TID) | ORAL | 0 refills | Status: DC

## 2018-09-25 MED ORDER — DILTIAZEM HCL 60 MG PO TABS
30.0000 mg | ORAL_TABLET | Freq: Three times a day (TID) | ORAL | Status: DC
  Filled 2018-09-25: qty 1

## 2018-09-25 MED ORDER — SODIUM CHLORIDE 0.9% FLUSH: 10.0000 mL | INTRAVENOUS | Status: DC | PRN

## 2018-09-25 MED ORDER — SODIUM CHLORIDE 0.9% FLUSH: 10.0000 mL | Freq: Two times a day (BID) | INTRAVENOUS | Status: DC

## 2018-09-25 MED ORDER — TRAMADOL HCL 50 MG PO TABS: 50.0000 mg | ORAL_TABLET | Freq: Four times a day (QID) | ORAL | 0 refills | Status: DC | PRN

## 2018-09-25 MED ORDER — FLUCONAZOLE 150 MG PO TABS
150.0000 mg | ORAL_TABLET | Freq: Once | ORAL | Status: AC
  Administered 2018-09-25: 150 mg via ORAL
  Filled 2018-09-25: qty 1

## 2018-09-25 NOTE — Clinical Social Work Placement (Signed)
   CLINICAL SOCIAL WORK PLACEMENT  NOTE  Date:  09/25/2018  Patient Details  Name: Brittany [name] MRN: [id number] Date of Birth: Mar 04, 1924  Clinical Social Work is seeking post-discharge placement for this patient at the Denajah Farias level of care (*CSW will initial, date and re-position this form in  chart as items are completed):  Yes   Patient/family provided with [facility] Work Department's list of facilities offering this level of care within the geographic area requested by the patient (or if unable, by the patient's family).  Yes   Patient/family informed of their freedom to choose among providers that offer the needed level of care, that participate in Medicare, Medicaid or managed care program needed by the patient, have an available bed and are willing to accept the patient.  Yes   Patient/family informed of [facility]'s ownership interest in [facility] and [facility], as well as of the fact that they are under no obligation to receive care at these facilities.  PASRR submitted to EDS on       PASRR number received on       Existing PASRR number confirmed on 09/18/18     FL2 transmitted to all facilities in geographic area requested by pt/family on 09/18/18     FL2 transmitted to all facilities within larger geographic area on       Patient informed that his/her managed care company has contracts with or will negotiate with certain facilities, including the following:  [facility]     Yes   Patient/family informed of bed offers received.  Patient [facility] bed at [facility]     Physician recommends and patient chooses bed at      Patient to be transferred to [facility] on 09/25/18.  Patient to be transferred to facility by [facility]     Patient family notified on 09/25/18 of transfer.  Name of family member notified:        PHYSICIAN       Additional Comment:     _______________________________________________ Estanislado Emms, LCSW 09/25/2018, 2:11 PM

## 2018-09-25 NOTE — Progress Notes (Signed)
Physical Therapy Treatment Patient Details Name: Brittany [name] MRN: [id number] DOB: 1924/07/01 Today's Date: 09/25/2018    History of Present Illness PAtient is a 84 y/o female with h/o dementia, myelodysplastic syndrome, shoulder pain, DJD, melanoma, admitted with fever and treated for UTI and aspiration pna.  She was hospitalized 8/30-08/18/18 with sepsis due to aspiration PNA and UTI with Enterobacter and E. Coli bacteremia. 09/21/18 I&D perirectal abscess     PT Comments    Pt remains weak and with limited mobility.    Follow Up Recommendations  SNF;Supervision/Assistance - 24 hour     Equipment Recommendations  None recommended by PT    Recommendations for Other Services       Precautions / Restrictions Precautions Precautions: Fall Precaution Comments: recent falls at facility     Mobility  Bed Mobility Overal bed mobility: Needs Assistance Bed Mobility: Supine to Sit;Rolling;Sit to Supine Rolling: Min assist   Supine to sit: Mod assist Sit to supine: +2 for physical assistance;Mod assist   General bed mobility comments: Assist to bring legs off of bed, elevate trunk into sitting, and bring hips to EOB. Assist to lower trunk and bring legs back up into bed.   Transfers Overall transfer level: Needs assistance Equipment used: Rolling walker (2 wheeled) Transfers: Sit to/from Stand Sit to Stand: +2 physical assistance;Mod assist         General transfer comment: Assist to bring hips up and for balance. Pt stood x 3 for 20-30 secs.  Ambulation/Gait Ambulation/Gait assistance: +2 physical assistance;Mod assist Gait Distance (Feet): 1 Feet Assistive device: Rolling walker (2 wheeled) Gait Pattern/deviations: Step-to pattern;Shuffle;Trunk flexed Gait velocity: decr Gait velocity interpretation: <1.8 ft/sec, indicate of risk for recurrent falls General Gait Details: Side stepped up side of bed 1' before having to sit   Stairs             Wheelchair  Mobility    Modified Rankin (Stroke Patients Only)       Balance Overall balance assessment: Needs assistance Sitting-balance support: Bilateral upper extremity supported;Feet supported Sitting balance-Leahy Scale: Poor Sitting balance - Comments: UE for support. Pt leans left to relieve pressure and site of I&D   Standing balance support: Bilateral upper extremity supported;During functional activity Standing balance-Leahy Scale: Poor Standing balance comment: walker and mod assist for static standing.                            Cognition Arousal/Alertness: Awake/alert Behavior During Therapy: Flat affect Overall Cognitive Status: History of cognitive impairments - at baseline                                        Exercises      General Comments        Pertinent Vitals/Pain Pain Assessment: Faces Faces Pain Scale: Hurts even more Pain Location: buttocks Pain Descriptors / Indicators: Grimacing;Guarding Pain Intervention(s): Monitored during session;Limited activity within patient's tolerance;Repositioned    Home Living                      Prior Function            PT Goals (current goals can now be found in the care plan section) Progress towards PT goals: Progressing toward goals    Frequency    Min 2X/week      PT Plan  Current plan remains appropriate    Co-evaluation              AM-PAC PT "6 Clicks" Daily Activity  Outcome Measure  Difficulty turning over in bed (including adjusting bedclothes, sheets and blankets)?: Unable Difficulty moving from lying on back to sitting on the side of the bed? : Unable Difficulty sitting down on and standing up from a chair with arms (e.g., wheelchair, bedside commode, etc,.)?: Unable Help needed moving to and from a bed to chair (including a wheelchair)?: A Lot Help needed walking in hospital room?: Total Help needed climbing 3-5 steps with a railing? : Total 6  Click Score: 7    End of Session Equipment Utilized During Treatment: Gait belt Activity Tolerance: Patient limited by fatigue Patient left: with call bell/phone within reach;in bed;with bed alarm set Nurse Communication: Mobility status;Need for lift equipment(possibly Stedy if knees buckling) PT Visit Diagnosis: Other abnormalities of gait and mobility (R26.89)     Time: 6160-7371 PT Time Calculation (min) (ACUTE ONLY): 20 min  Charges:  $Therapeutic Activity: 8-22 mins                     [place] Pager [phone] Office [place] 09/25/2018, 11:06 AM

## 2018-09-25 NOTE — Progress Notes (Signed)
4 Days Post-Op    CC:  Perirectal abscess  Subjective: Pt sleeping and still confused.  Open wound looks fine some slough within the wound, but overall clean.  There is no way to get her into a Sitz bath.    Objective: Vital signs in last 24 hours: Temp:  [97.7 F (36.5 C)-98.1 F (36.7 C)] 97.7 F (36.5 C) (10/11 0443) Pulse Rate:  [55-72] 56 (10/11 0443) Resp:  [19-26] 26 (10/10 2245) BP: (124-177)/(43-66) 162/46 (10/11 0443) SpO2:  [95 %-99 %] 95 % (10/11 0443) Weight:  [70.5 kg] 70.5 kg (10/11 0443) Last BM Date: 09/23/18 650 PO 2100 IV Urine x 4 Stool x 1 Afebrile, VSS WBC 1.3 H/H = 8.5/27.2 Intake/Output from previous day: 10/10 0701 - 10/11 0700 In: 2852.1 [P.O.:650; I.V.:2102.1; IV Piggyback:100] Out: -  Intake/Output this shift: No intake/output data recorded.  General appearance: Confused, but as cooperative as she can be. Skin: open site perirectal abscess is clean, erythema is better, some slough in the base of the wound.    Lab Results:  Recent Labs    09/24/18 0725 09/25/18 0539  WBC 1.4* 1.3*  HGB 9.2* 8.5*  HCT 29.5* 27.2*  PLT 150 164    BMET Recent Labs    09/24/18 0330 09/25/18 0539  NA 142 141  K 4.7 4.6  CL 109 105  CO2 28 30  GLUCOSE 101* 89  BUN 19 14  CREATININE 0.87 0.86  CALCIUM 8.7* 8.8*   PT/INR No results for input(s): LABPROT, INR in the last 72 hours.  Recent Labs  Lab 09/20/18 0706 09/21/18 0645  AST 17 20  ALT 31 25  ALKPHOS 72 64  BILITOT 1.1 0.6  PROT 4.9* 5.2*  ALBUMIN 2.2* 2.2*     Lipase  No results found for: LIPASE   Medications: . sodium chloride   Intravenous Once  . acetaminophen  500 mg Oral Q8H  . aspirin  81 mg Oral Daily  . cycloSPORINE  1 drop Both Eyes BID  . diltiazem  60 mg Oral Q8H  . donepezil  10 mg Oral QHS  . levothyroxine  88 mcg Oral Q24H  . metoprolol tartrate  25 mg Oral BID  . sodium chloride flush  3 mL Intravenous Q12H   Anti-infectives (From admission, onward)   Start     Dose/Rate Route Frequency Ordered Stop   09/20/18 1300  meropenem (MERREM) 1 g in sodium chloride 0.9 % 100 mL IVPB     1 g 200 mL/hr over 30 Minutes Intravenous Every 12 hours 09/20/18 1254     09/18/18 1600  ceFEPIme (MAXIPIME) 2 g in sodium chloride 0.9 % 100 mL IVPB  Status:  Discontinued     2 g 200 mL/hr over 30 Minutes Intravenous Every 24 hours 09/18/18 1358 09/20/18 1254   09/18/18 1200  vancomycin (VANCOCIN) IVPB 1000 mg/200 mL premix  Status:  Discontinued     1,000 mg 200 mL/hr over 60 Minutes Intravenous Every 48 hours 09/16/18 0918 09/17/18 1151   09/17/18 1400  vancomycin (VANCOCIN) IVPB 750 mg/150 ml premix  Status:  Discontinued     750 mg 150 mL/hr over 60 Minutes Intravenous Every 24 hours 09/17/18 1151 09/19/18 1151   09/17/18 1000  ceFEPIme (MAXIPIME) 1 g in sodium chloride 0.9 % 100 mL IVPB  Status:  Discontinued     1 g 200 mL/hr over 30 Minutes Intravenous Every 24 hours 09/16/18 0918 09/18/18 1358   09/16/18 1600  metroNIDAZOLE (FLAGYL)  IVPB 500 mg  Status:  Discontinued     500 mg 100 mL/hr over 60 Minutes Intravenous Every 8 hours 09/16/18 0905 09/20/18 1254   09/16/18 0915  ceFEPIme (MAXIPIME) 2 g in sodium chloride 0.9 % 100 mL IVPB  Status:  Discontinued     2 g 200 mL/hr over 30 Minutes Intravenous  Once 09/16/18 0905 09/17/18 1147   09/16/18 0915  vancomycin (VANCOCIN) IVPB 1000 mg/200 mL premix     1,000 mg 200 mL/hr over 60 Minutes Intravenous  Once 09/16/18 0905 09/16/18 1324   09/16/18 0615  metroNIDAZOLE (FLAGYL) IVPB 500 mg     500 mg 100 mL/hr over 60 Minutes Intravenous  Once 09/16/18 0607 09/16/18 0758   09/16/18 0200  cefTRIAXone (ROCEPHIN) 1 g in sodium chloride 0.9 % 100 mL IVPB     1 g 200 mL/hr over 30 Minutes Intravenous  Once 09/16/18 0146 09/16/18 0302      Assessment/Plan  Myelodysplastic syndrome ([place])- receiving transfusions - hgb/hct 8.7/27.5 Hypothyroidism Essential hypertension Vascular dementia without  behavioral disturbance (HCC) CKD (chronic kidney disease) stage 3, GFR 30-59 ml/min (HCC) AKI (acute kidney injury) ([place]) Diverticulitis   Right gluteal cleft cellulitis and abscess - S/P I&D, Dr. Donne Hazel, 10/07  FEN: DIII diet ID: currently Meropenem 10/02>> - WBC 1.3  - No growth from abscess so far DVT: SCD/ASA Foley: None Follow up: 2 weeks [facility] for wound check   Plan:  We would normally put her in a Sitz bath a few times a day, but she cannot do that.  Will ask Hydrotherapy to work on her while she is here.  We will see again on Monday if she is still here.   Palliative is following.   LOS: 9 days    Caress Reffitt 09/25/2018 [phone]

## 2018-09-25 NOTE — Progress Notes (Addendum)
[facility] for Infectious Disease  Date of Admission:  09/16/2018   Total days of antibiotics 11        Day 6 merrem           Patient ID: Brittany [name] is a 84 y.o. F  Principal Problem:   Perirectal inflammation Active Problems:   Fever   Pancytopenia (HCC)   Myelodysplastic syndrome (HCC)   Hypothyroidism   Essential hypertension   Vascular dementia without behavioral disturbance (HCC)   CKD (chronic kidney disease) stage 3, GFR 30-59 ml/min (HCC)   AKI (acute kidney injury) ([place])   Diverticulitis   Sepsis with acute renal failure without septic shock ([place])   Palliative care encounter   . acetaminophen  500 mg Oral Q8H  . aspirin  81 mg Oral Daily  . cycloSPORINE  1 drop Both Eyes BID  . diltiazem  30 mg Oral Q8H  . donepezil  10 mg Oral QHS  . levothyroxine  88 mcg Oral Q24H  . metoprolol tartrate  25 mg Oral BID  . sodium chloride flush  3 mL Intravenous Q12H    SUBJECTIVE: Planning discharge to SNF.   Allergies  Allergen Reactions  . Lisinopril     REACTION: angioedema  . Penicillins     REACTION: rash    OBJECTIVE: Vitals:   09/24/18 2236 09/24/18 2245 09/25/18 0443 09/25/18 0936  BP: (!) 158/57  (!) 162/46 (!) 165/60  Pulse: 72 69 (!) 56 63  Resp:  (!) 26    Temp:   97.7 F (36.5 C)   TempSrc:   Oral   SpO2:  97% 95%   Weight:   70.5 kg   Height:       Body mass index is 31.37 kg/m.  Physical Exam  Constitutional: She is oriented to person, place, and time. She appears well-developed and well-nourished.  Resting comfortably in bed. Smiling.   HENT:  Mouth/Throat: Mucous membranes are normal. No oral lesions. Normal dentition. No dental abscesses. No oropharyngeal exudate.  Cardiovascular: Regular rhythm and normal heart sounds.  bradycardic  Pulmonary/Chest: Effort normal and breath sounds normal.  Abdominal: Soft. She exhibits no distension. There is no tenderness.  Musculoskeletal: She exhibits no edema.  Neurological:  She is alert and oriented to person, place, and time.  Skin: Skin is warm and dry. No rash noted. There is pallor.  Psychiatric: She has a normal mood and affect. Judgment normal.  Nursing note and vitals reviewed. Mixed irritant dermatitis and candidal intertrigo of the gluteal fold. No odor and minimal drainage. Wound bed occupied by yellow slough.         Lab Results Lab Results  Component Value Date   WBC 1.3 (LL) 09/25/2018   HGB 8.5 (L) 09/25/2018   HCT 27.2 (L) 09/25/2018   MCV 88.6 09/25/2018   PLT 164 09/25/2018    Lab Results  Component Value Date   CREATININE 0.86 09/25/2018   BUN 14 09/25/2018   NA 141 09/25/2018   K 4.6 09/25/2018   CL 105 09/25/2018   CO2 30 09/25/2018    Lab Results  Component Value Date   ALT 25 09/21/2018   AST 20 09/21/2018   ALKPHOS 64 09/21/2018   BILITOT 0.6 09/21/2018     Microbiology: Recent Results (from the past 240 hour(s))  Culture, blood (Routine x 2)     Status: None   Collection Time: 09/16/18 12:55 AM  Result Value Ref Range  Status   Specimen Description BLOOD BLOOD LEFT FOREARM  Final   Special Requests   Final    BOTTLES DRAWN AEROBIC AND ANAEROBIC Blood Culture adequate volume   Culture   Final    NO GROWTH 5 DAYS Performed at [facility], [address], [place], [place] [address]    Report Status 09/21/2018 FINAL  Final  Culture, blood (Routine x 2)     Status: None   Collection Time: 09/16/18 12:57 AM  Result Value Ref Range Status   Specimen Description BLOOD LEFT WRIST  Final   Special Requests   Final    BOTTLES DRAWN AEROBIC AND ANAEROBIC Blood Culture results may not be optimal due to an inadequate volume of blood received in culture bottles   Culture   Final    NO GROWTH 5 DAYS Performed at [facility], [place] [address] [place], [place] [address]    Report Status 09/21/2018 FINAL  Final  C difficile quick scan w PCR reflex     Status: None   Collection Time: 09/16/18  2:32 AM    Result Value Ref Range Status   C Diff antigen NEGATIVE NEGATIVE Final   C Diff toxin NEGATIVE NEGATIVE Final   C Diff interpretation No C. difficile detected.  Final    Comment: Performed at [facility], [place] [address], [place], [place] [address]  Culture, Urine     Status: None   Collection Time: 09/18/18  1:46 PM  Result Value Ref Range Status   Specimen Description URINE, CATHETERIZED  Final   Special Requests NONE  Final   Culture   Final    NO GROWTH Performed at [facility], [address] [place], [place] [address]    Report Status 09/19/2018 FINAL  Final  MRSA PCR Screening     Status: None   Collection Time: 09/19/18  1:34 PM  Result Value Ref Range Status   MRSA by PCR NEGATIVE NEGATIVE Final    Comment:        The GeneXpert MRSA Assay (FDA approved for NASAL specimens only), is one component of a comprehensive MRSA colonization surveillance program. It is not intended to diagnose MRSA infection nor to guide or monitor treatment for MRSA infections. Performed at [facility], [place] [address] [place], [place] [address]   Surgical pcr screen     Status: None   Collection Time: 09/21/18 12:07 PM  Result Value Ref Range Status   MRSA, PCR NEGATIVE NEGATIVE Final   Staphylococcus aureus NEGATIVE NEGATIVE Final    Comment: (NOTE) The Xpert SA Assay (FDA approved for NASAL specimens in patients 71 years of age and older), is one component of a comprehensive surveillance program. It is not intended to diagnose infection nor to guide or monitor treatment. Performed at [facility], [place] [address] [place], [place] [address]   Aerobic/Anaerobic Culture (surgical/deep wound)     Status: None (Preliminary result)   Collection Time: 09/21/18  2:15 PM  Result Value Ref Range Status   Specimen Description ABSCESS PERIRECTAL  Final   Special Requests NONE  Final   Gram Stain NO WBC SEEN NO ORGANISMS SEEN   Final   Culture   Final    NO  GROWTH 4 DAYS NO ANAEROBES ISOLATED; CULTURE IN PROGRESS FOR 5 DAYS Performed at [facility], [place] [address], [place], [place] [address]    Report Status PENDING  Incomplete  Assessment:  Brittany [name] is preparing for discharge to SNF with consideration for hospice care should her condition worsen. She is confused but pleasant. No growth from abscess cultures. She has some slough to the wound bed but no signs of ongoing infection per surgery team - starting hydrotherapy for debridement and wound care. Continue per their recommendations.   She has what looks like a mixed dermatitis with irritant (incontinent of urine) and fungal. Will treat with diflucan 150 mg x 1. Thorough cleansing and application of protectant to skin after each episode of incontinence or as directed by wound care team.   She would probably benefit from midline vs PICC line with shorter duration of therapy - would ask IV team to determine best vascular access.   Plan:  1. Continue meropenem x 8 more days (10/03/18) 2. OPAT orders below  3. Wound care per surgery recommendations  4. Consider midline vs PICC as her duration of therapy is short  5. Will give a dose of diflucan x 1. SNF can repeat in 3-5 days if needed.    OPAT ORDERS:  Diagnosis: peri-rectal abscess   Culture Result: culture negative   Allergies  Allergen Reactions  . Lisinopril     REACTION: angioedema  . Penicillins     REACTION: rash    Discharge antibiotics: Meropenem 1g BID IV   Duration: 8 days   End Date: 10/03/18  [facility] and Maintenance Per Protocol _x_ Please pull PIC at completion of IV antibiotics   Labs weekly while on IV antibiotics: _x_ CBC with differential _x_ BMP   Fax weekly labs to [phone]  Clinic Follow Up Appt: Follow up as directed with surgery team    [name], MSN, NP-C [facility] for Infectious [place] Cell: [phone] Pager:  [phone]  09/25/2018  10:14 AM

## 2018-09-25 NOTE — Discharge Summary (Addendum)
Physician Discharge Summary  Brittany [name] ZOX:[id number] DOB: 1924-07-04 DOA: 09/16/2018  PCP: Blanchie Serve, MD  Admit date: 09/16/2018 Discharge date: 09/25/2018  Admitted From: SNF Disposition:  SNF  Recommendations for Outpatient Follow-up:  1. Follow up with SNF provider at earliest convenience 2. Follow-up with palliative care as an outpatient next  3. follow-up with general surgery as an outpatient.  Wound care as per general surgery recommendations 4. follow-up with wound care as an outpatient 5. Follow-up with cardiology as an outpatient 6. Follow-up with infectious diseases as an outpatient 7. If condition deteriorates, consider hospice/comfort measures 8. Follow-up with Dr. Blenda Bridegroom as an outpatient   Home Health: No Equipment/Devices: None  Discharge Condition: Guarded CODE STATUS: DNR Diet recommendation: Heart Healthy/dysphagia diet as per SLP recommendations  Brief/Interim Summary: 84 year old female with history of dementia, myelodysplasia, hypothyroidism, hypertension, hyperlipidemia, chronic kidney disease stage III, CVA in 2013 presented on 09/16/2018 with fever and altered mental status.  CT of the abdomen and pelvis showed probable acute distal sigmoid diverticulitis and chest x-ray showed right lower lobe probable infiltrates.  Patient was started on intravenous antibiotics.  ID was consulted.  Patient had persistent fevers.  Patient had repeat CT of the abdomen which showed no evidence of abscess but there was stable inflammation in the right gluteal cleft.  Antibiotics was changed to meropenem.  General surgery was consulted.  Cardiology was also consulted for A. fib with RVR and patient was started on Cardizem drip which was subsequently switched to oral Cardizem.  Patient had I&D done of the left gluteal abscess on 09/21/2018.  Palliative care was also consulted.  Hematology was also consulted for pancytopenia.  Patient is currently on IV meropenem and ID  recommends total of 2 weeks course of therapy.  After palliative care discussion, family have decided to continue with current management with outpatient palliative care follow-up.  If condition worsens, consider hospice/comfort measures.  Patient will get a PICC line today and will probably be discharged to nursing home today.  Discharge Diagnoses:  Principal Problem:   Perirectal inflammation Active Problems:   Myelodysplastic syndrome (HCC)   Hypothyroidism   Essential hypertension   Vascular dementia without behavioral disturbance (HCC)   CKD (chronic kidney disease) stage 3, GFR 30-59 ml/min (HCC)   AKI (acute kidney injury) (HCC)   Diverticulitis   Fever   Sepsis with acute renal failure without septic shock ([place])   Palliative care encounter   Pancytopenia ([place])  Sepsis secondary to left gluteal cellulitis/abscess -Antibiotic plan as below.  Resolved  Left gluteal cellulitis/abscess -Status post I&D on 09/21/2018 by general surgery.  Wound care as per general surgery recommendations.  No growth from the OR cultures -Continue meropenem as per ID till 10/03/18.  Patient will get PICC line today and meropenem can be continued in the nursing home.  Outpatient follow-up with ID and general surgery.  General surgery has cleared the patient for discharge  Pancytopenia in the setting of MDS -Oncology evaluated the patient -Communicated with Dr. Gorsuch/oncology on 09/23/18 on phone and will avoid G-CSF unless patient is septic or spiking temperatures -Monitor.  No signs of bleeding -Follow-up labs in the nursing home.  Outpatient follow-up with Dr. Alvy Bimler   Paroxysmal atrial fibrillation -Cardiology evaluated the patient.  Cardizem drip has been switched to oral Cardizem.  Continue metoprolol.  Currently patient has sinus bradycardia; hold Cardizem dose if heart rate <60 -Not a good candidate for anticoagulation as per cardiology.  Outpatient follow-up with cardiology.  If patient  continues to have bradycardia, consider discontinuing Cardizem and decreasing the dose of metoprolol.  Hypomagnesemia -Replace replaced.   AKI on chronic kidney disease stage III -Resolved.  Resume losartan.  Outpatient follow-up  History of CVA with vascular dementia -Continue aspirin and donepezil.  Patient's baseline mental status is apparently she recognizes her sons  Hypertension -Monitor blood pressure.  Continue metoprolol, Cardizem and losartan.  Hypothyroidism -Continue Synthroid  Failure to thrive/generalized deconditioning -Overall prognosis is guarded to poor.  Palliative care evaluated the patient and had family discussion.  Family has decided to continue current treatment along with IV antibiotics.  Patient will benefit from outpatient palliative care follow-up.- -If condition worsens, recommend hospice/comfort measures.  Discharge Instructions  Discharge Instructions    Ambulatory referral to Infectious Disease   Complete by:  As directed    Gluteal abscess followup   Call MD for:  difficulty breathing, headache or visual disturbances   Complete by:  As directed    Call MD for:  extreme fatigue   Complete by:  As directed    Call MD for:  hives   Complete by:  As directed    Call MD for:  persistant dizziness or light-headedness   Complete by:  As directed    Call MD for:  persistant nausea and vomiting   Complete by:  As directed    Call MD for:  redness, tenderness, or signs of infection (pain, swelling, redness, odor or green/yellow discharge around incision site)   Complete by:  As directed    Call MD for:  severe uncontrolled pain   Complete by:  As directed    Call MD for:  temperature >100.4   Complete by:  As directed    Diet - low sodium heart healthy   Complete by:  As directed    Increase activity slowly   Complete by:  As directed      Allergies as of 09/25/2018      Reactions   Lisinopril    REACTION: angioedema   Penicillins     REACTION: rash      Medication List    TAKE these medications   acetaminophen 500 MG tablet Commonly known as:  TYLENOL Take 500 mg by mouth every 8 (eight) hours as needed for mild pain.   aspirin EC 81 MG tablet Take 81 mg by mouth daily.   CALTRATE 600+D PLUS 600-400 MG-UNIT per tablet Take 1 tablet by mouth daily.   cycloSPORINE 0.05 % ophthalmic emulsion Commonly known as:  RESTASIS Place 1 drop into both eyes 2 (two) times daily.   diltiazem 30 MG tablet Commonly known as:  CARDIZEM Take 1 tablet (30 mg total) by mouth every 8 (eight) hours. Hold dose if heart rate less than 60   donepezil 10 MG tablet Commonly known as:  ARICEPT Take 10 mg by mouth daily.   hydroxypropyl methylcellulose / hypromellose 2.5 % ophthalmic solution Commonly known as:  ISOPTO TEARS / GONIOVISC Place 1 drop into both eyes every 2 (two) hours as needed for dry eyes.   levothyroxine 88 MCG tablet Commonly known as:  SYNTHROID, LEVOTHROID Take 1 tablet (88 mcg total) by mouth daily before breakfast.   losartan 50 MG tablet Commonly known as:  COZAAR Take 1 tablet (50 mg total) by mouth daily.   meropenem  IVPB Commonly known as:  MERREM Inject 1 g into the vein every 12 (twelve) hours for 8 days. Indication:  Peri-rectal abscess Last Day of Therapy:  10/03/18  Labs - Once weekly:  CBC/D and BMP, Labs - Every other week:  ESR and CRP   metoprolol tartrate 25 MG tablet Commonly known as:  LOPRESSOR Take 1 tablet (25 mg total) by mouth 2 (two) times daily.   multivitamin with minerals Tabs tablet Take 1 tablet by mouth daily.   NUTRITIONAL SHAKE PO Take 120 mLs by mouth 2 (two) times daily.   polyethylene glycol powder powder Commonly known as:  GLYCOLAX/MIRALAX Take 17 g by mouth daily as needed for mild constipation.   PREVIDENT 5000 BOOSTER PLUS 1.1 % Pste Generic drug:  Sodium Fluoride Place 1 application onto teeth daily.   traMADol 50 MG tablet Commonly known as:   ULTRAM Take 1 tablet (50 mg total) by mouth every 6 (six) hours as needed for moderate pain.   VITAMIN D-3 PO Take 1,000 mg by mouth daily.            Home Infusion Instuctions  (From admission, onward)         Start     Ordered   09/25/18 0000  Home infusion instructions    Question:  Instructions  Answer:  Flushing of vascular access device: 0.9% NaCl pre/post medication administration and prn patency; Heparin 100 u/ml, 57m for implanted ports and Heparin 10u/ml, 513mfor all other central venous catheters.   09/25/18 1023           Contact information for follow-up providers    WaRolm BookbinderMD. Schedule an appointment as soon as possible for a visit in 1 week(s).   Specialty:  General Surgery Contact information: [phone]        palliative care. Schedule an appointment as soon as possible for a visit in 1 week(s).        wound care Follow up.   Why:  At earliest convenience       KaDixie DialsMD. Schedule an appointment as soon as possible for a visit in 1 week(s).   Specialty:  Cardiology Contact information: [phone]          Contact information for after-discharge care    Destination    [facility] .   Service:  Skilled Nursing Contact information: [id number] [phone]               Allergies  Allergen Reactions  . Lisinopril     REACTION: angioedema  . Penicillins     REACTION: rash    Consultations: ID/general surgery/hematology/cardiology/palliative care   Procedures/Studies: Ct Abdomen Pelvis Wo Contrast  Result Date: 09/20/2018 CLINICAL DATA:  89/o F; suspected abscess of the anal and rectal region. EXAM: CT ABDOMEN AND PELVIS WITHOUT CONTRAST TECHNIQUE: Multidetector CT imaging of the abdomen and pelvis was performed following the standard  protocol without IV contrast. COMPARISON:  09/16/2018 CT abdomen and pelvis. FINDINGS: Lower chest: Small bilateral pleural effusions. Hepatobiliary: No focal liver abnormality is seen. No gallstones, gallbladder wall thickening, or biliary dilatation. Pancreas: Unremarkable. No pancreatic ductal dilatation or surrounding inflammatory changes. Spleen: Spleen measures 12.6 x 6.7 x 10.2 cm (volume = 450 cm^3). Adrenals/Urinary Tract: Stable mild adrenal thickening without mass. No hydronephrosis or urinary stone disease. Left kidney lower pole 27 mm cyst with peripheral calcification. Right kidney upper pole 15 mm cyst with peripheral calcifications. Normal bladder. Stomach/Bowel: Stomach is within normal limits. Appendix not  identified. No obstructive or inflammatory changes of the small bowel. Mild stable wall thickening of sigmoid colon diverticulosis, wall thickening, and surrounding pericholecystic edema. No perforation or abscess. Vascular/Lymphatic: Aortic atherosclerosis. No enlarged abdominal or pelvic lymph nodes. Reproductive: Status post hysterectomy. No adnexal masses. Other: Stable small volume of fluid in the pelvis. Musculoskeletal: No fracture is seen. Stable edema within the right gluteal cleft. No discrete fluid collection is identified. Moderate lumbar levocurvature with apex at L2. No acute osseous abnormality identified. IMPRESSION: 1. Stable inflammation within the right gluteal cleft. No discrete fluid collection identified. 2. Stable findings suspicious for acute sigmoid diverticulitis. 3. New small bilateral pleural effusions. 4. Stable mild splenomegaly. 5. Aortic Atherosclerosis (ICD10-I70.0). Electronically Signed   By: Kristine Garbe M.D.   On: 09/20/2018 03:10   Ct Abdomen Pelvis Wo Contrast  Result Date: 09/16/2018 CLINICAL DATA:  Fever of unknown origin. Assess for enterovaginal fistula. History of rectal prolapse post rectocele repair. EXAM: CT ABDOMEN AND PELVIS WITHOUT  CONTRAST TECHNIQUE: Multidetector CT imaging of the abdomen and pelvis was performed following the standard protocol without IV contrast. Attempted administration of rectal contrast, however patient did not tolerate. COMPARISON:  CT 07/08/2012.  Renal ultrasound 08/16/2018 FINDINGS: Lower chest: Motion artifact with minor atelectasis. Aortic atherosclerosis. Heart size normal. Small hiatal hernia. Hepatobiliary: Mild motion artifact through the liver and gallbladder. No focal hepatic abnormality. No calcified gallstone or pericholecystic inflammation. No biliary dilatation. Pancreas: Parenchymal atrophy. No ductal dilatation or inflammation. Spleen: Normal in size without focal abnormality. Adrenals/Urinary Tract: Mild adrenal thickening without dominant mass. No hydronephrosis. Mild right perinephric edema. Probable peripheral calcified cyst in the upper right kidney measuring 18 mm. Cyst in the lower left kidney has faint peripheral calcifications. Additional cortical cysts in both kidneys suspected. Urinary bladder is physiologically distended without wall thickening. Stomach/Bowel: Motion artifact and lack of oral contrast limits detailed bowel assessment. Minimal enteric contrast within the rectum and distal sigmoid colon. No rectal contrast in the vagina. Distal colonic diverticulosis, advanced in the sigmoid colon. There is sigmoid colonic wall thickening in the area of diverticula, likely moral hypertrophy, mild pericolonic edema in the distal sigmoid. Distal to the rectum is edema in the right greater than left gluteal soft tissues without well-defined fluid collection. Appendix not visualized, surgically absent per history. Scattered fluid-filled small bowel is nondilated and noninflamed. Vascular/Lymphatic: Aorto bi-iliac atherosclerosis. Aortic tortuosity without aneurysm. Limited assessment for adenopathy given lack contrast and paucity of intra-abdominal fat. No bulky adenopathy. Reproductive: Post  hysterectomy. Unremarkable appearance of the vaginal cuff. No rectal contrast in the vaginal cuff, however patient did not tolerate rectal contrast administration. Other: Small amount of free fluid in the left pelvis. No free air. No upper abdominal ascites. Musculoskeletal: Scoliotic curvature in degenerative change in the spine. There are no acute or suspicious osseous abnormalities. IMPRESSION: 1. Findings suspicious for acute distal sigmoid diverticulitis. No perforation or abscess. 2. Small amount of free fluid in the left pelvis, likely reactive. 3. Unremarkable appearance of the vaginal cuff. 4. Perirectal edema tracking distally in the gluteal crease without perirectal abscess or soft tissue air. 5.  Aortic Atherosclerosis (ICD10-I70.0). Electronically Signed   By: Keith Rake M.D.   On: 09/16/2018 05:39   Dg Chest 2 View  Result Date: 09/16/2018 CLINICAL DATA:  Fever EXAM: CHEST - 2 VIEW COMPARISON:  08/14/2018 FINDINGS: Patient is rotated. Possible right infrahilar opacity, adjacent to the right heart border (overlying the spine) on the frontal radiograph, possibly posterior on the lateral view.  This was also present on the prior. Left lung is clear. The heart is normal in size. IMPRESSION: Possible right infrahilar opacity, raising concern for right lower lobe pneumonia, although also present on the prior study. Consider CT chest with contrast for further evaluation. Electronically Signed   By: Julian Hy M.D.   On: 09/16/2018 01:29   Ct Head Wo Contrast  Result Date: 09/16/2018 CLINICAL DATA:  84 y/o F; fever of unknown origin. Altered mental status. History of hypertension and dementia. EXAM: CT HEAD WITHOUT CONTRAST TECHNIQUE: Contiguous axial images were obtained from the base of the skull through the vertex without intravenous contrast. COMPARISON:  08/15/2018 CT head FINDINGS: Brain: No evidence of acute infarction, hemorrhage, hydrocephalus, extra-axial collection or mass  lesion/mass effect. Stable chronic microvascular ischemic changes and volume loss of the brain. Vascular: Calcific atherosclerosis of carotid siphons. No hyperdense vessel identified. Skull: Normal. Negative for fracture or focal lesion. Sinuses/Orbits: No acute finding. Other: None. IMPRESSION: 1. No acute intracranial abnormality identified. 2. Stable chronic microvascular ischemic changes and volume loss of the brain. Electronically Signed   By: Kristine Garbe M.D.   On: 09/16/2018 04:39      Subjective: Patient seen and examined at bedside.  She is more awake and answers few questions, still slightly confused.  No overnight fever, nausea or vomiting.  Discharge Exam: Vitals:   09/25/18 0443 09/25/18 0936  BP: (!) 162/46 (!) 165/60  Pulse: (!) 56 63  Resp:    Temp: 97.7 F (36.5 C)   SpO2: 95%    Vitals:   09/24/18 2236 09/24/18 2245 09/25/18 0443 09/25/18 0936  BP: (!) 158/57  (!) 162/46 (!) 165/60  Pulse: 72 69 (!) 56 63  Resp:  (!) 26    Temp:   97.7 F (36.5 C)   TempSrc:   Oral   SpO2:  97% 95%   Weight:   70.5 kg   Height:        General: Pt is more awake this morning but still slightly confused.  No distress Cardiovascular: Slightly bradycardic intermittently, S1/S2 + Respiratory: bilateral decreased breath sounds at bases with some scattered crackles Abdominal: Soft, NT, ND, bowel sounds + Extremities: no edema, no cyanosis    The results of significant diagnostics from this hospitalization (including imaging, microbiology, ancillary and laboratory) are listed below for reference.     Microbiology: Recent Results (from the past 240 hour(s))  Culture, blood (Routine x 2)     Status: None   Collection Time: 09/16/18 12:55 AM  Result Value Ref Range Status   Specimen Description BLOOD BLOOD LEFT FOREARM  Final   Special Requests   Final    BOTTLES DRAWN AEROBIC AND ANAEROBIC Blood Culture adequate volume   Culture   Final    NO GROWTH 5  DAYS Performed at [facility], [address] [place], [place] [address]    Report Status 09/21/2018 FINAL  Final  Culture, blood (Routine x 2)     Status: None   Collection Time: 09/16/18 12:57 AM  Result Value Ref Range Status   Specimen Description BLOOD LEFT WRIST  Final   Special Requests   Final    BOTTLES DRAWN AEROBIC AND ANAEROBIC Blood Culture results may not be optimal due to an inadequate volume of blood received in culture bottles   Culture   Final    NO GROWTH 5 DAYS Performed at [facility], [place] [address], [place], [place] [address]  Report Status 09/21/2018 FINAL  Final  C difficile quick scan w PCR reflex     Status: None   Collection Time: 09/16/18  2:32 AM  Result Value Ref Range Status   C Diff antigen NEGATIVE NEGATIVE Final   C Diff toxin NEGATIVE NEGATIVE Final   C Diff interpretation No C. difficile detected.  Final    Comment: Performed at [facility], [place] [address] [place], [place] [address]  Culture, Urine     Status: None   Collection Time: 09/18/18  1:46 PM  Result Value Ref Range Status   Specimen Description URINE, CATHETERIZED  Final   Special Requests NONE  Final   Culture   Final    NO GROWTH Performed at [facility], [address], [place], [place] [address]    Report Status 09/19/2018 FINAL  Final  MRSA PCR Screening     Status: None   Collection Time: 09/19/18  1:34 PM  Result Value Ref Range Status   MRSA by PCR NEGATIVE NEGATIVE Final    Comment:        The GeneXpert MRSA Assay (FDA approved for NASAL specimens only), is one component of a comprehensive MRSA colonization surveillance program. It is not intended to diagnose MRSA infection nor to guide or monitor treatment for MRSA infections. Performed at [facility], [place] [address], [place], [place] [address]   Surgical pcr screen     Status: None   Collection Time: 09/21/18 12:07 PM  Result Value Ref Range Status   MRSA, PCR NEGATIVE  NEGATIVE Final   Staphylococcus aureus NEGATIVE NEGATIVE Final    Comment: (NOTE) The Xpert SA Assay (FDA approved for NASAL specimens in patients 21 years of age and older), is one component of a comprehensive surveillance program. It is not intended to diagnose infection nor to guide or monitor treatment. Performed at [facility], [place] [address], [place], [place] [address]   Aerobic/Anaerobic Culture (surgical/deep wound)     Status: None (Preliminary result)   Collection Time: 09/21/18  2:15 PM  Result Value Ref Range Status   Specimen Description ABSCESS PERIRECTAL  Final   Special Requests NONE  Final   Gram Stain NO WBC SEEN NO ORGANISMS SEEN   Final   Culture   Final    NO GROWTH 3 DAYS NO ANAEROBES ISOLATED; CULTURE IN PROGRESS FOR 5 DAYS Performed at [facility], [place] [address] [place], [place] [address]    Report Status PENDING  Incomplete     Labs: BNP (last 3 results) No results for input(s): BNP in the last 8760 hours. Basic Metabolic Panel: Recent Labs  Lab 09/21/18 0645 09/22/18 0606 09/23/18 0443 09/24/18 0330 09/25/18 0539  NA 142 142 141 142 141  K 4.3 4.6 4.6 4.7 4.6  CL 109 114* 111 109 105  CO2 '23 22 26 28 30  '$ GLUCOSE 95 95 103* 101* 89  BUN 32* 34* 25* 19 14  CREATININE 1.22* 0.98 0.89 0.87 0.86  CALCIUM 8.5* 8.2* 8.6* 8.7* 8.8*  MG 1.8 1.6* 1.9 1.5* 1.7   Liver Function Tests: Recent Labs  Lab 09/20/18 0706 09/21/18 0645  AST 17 20  ALT 31 25  ALKPHOS 72 64  BILITOT 1.1 0.6  PROT 4.9* 5.2*  ALBUMIN 2.2* 2.2*   No results for input(s): LIPASE, AMYLASE in the last 168 hours. No results for input(s): AMMONIA in the last 168 hours. CBC: Recent Labs  Lab  09/19/18 0453 09/20/18 0706 09/21/18 0645 09/22/18 0606 09/23/18 0443 09/24/18 0725 09/25/18 0539  WBC 3.6* 2.1* 2.6* 1.5* 1.3* 1.4* 1.3*  NEUTROABS 2.8 1.7  --   --  0.4*  --  0.3*  HGB 7.3* 8.7* 9.9* 8.2* 8.3* 9.2* 8.5*  HCT 23.8* 27.5* 30.8* 26.6* 27.4* 29.5*  27.2*  MCV 90.2 89.3 89.0 91.4 90.1 90.5 88.6  PLT 98* 117* 99* 114* 137* 150 164   Cardiac Enzymes: No results for input(s): CKTOTAL, CKMB, CKMBINDEX, TROPONINI in the last 168 hours. BNP: Invalid input(s): POCBNP CBG: No results for input(s): GLUCAP in the last 168 hours. D-Dimer No results for input(s): DDIMER in the last 72 hours. Hgb A1c No results for input(s): HGBA1C in the last 72 hours. Lipid Profile No results for input(s): CHOL, HDL, LDLCALC, TRIG, CHOLHDL, LDLDIRECT in the last 72 hours. Thyroid function studies No results for input(s): TSH, T4TOTAL, T3FREE, THYROIDAB in the last 72 hours.  Invalid input(s): FREET3 Anemia work up No results for input(s): VITAMINB12, FOLATE, FERRITIN, TIBC, IRON, RETICCTPCT in the last 72 hours. Urinalysis    Component Value Date/Time   COLORURINE YELLOW 09/18/2018 1346   APPEARANCEUR CLOUDY (A) 09/18/2018 1346   LABSPEC 1.012 09/18/2018 1346   LABSPEC 1.015 02/11/2007 1508   PHURINE 5.0 09/18/2018 1346   GLUCOSEU NEGATIVE 09/18/2018 1346   HGBUR SMALL (A) 09/18/2018 1346   BILIRUBINUR NEGATIVE 09/18/2018 1346   BILIRUBINUR Negative 02/11/2007 1508   KETONESUR NEGATIVE 09/18/2018 1346   PROTEINUR 30 (A) 09/18/2018 1346   UROBILINOGEN 0.2 04/20/2013 1608   NITRITE NEGATIVE 09/18/2018 1346   LEUKOCYTESUR LARGE (A) 09/18/2018 1346   LEUKOCYTESUR Small 02/11/2007 1508   Sepsis Labs Invalid input(s): PROCALCITONIN,  WBC,  LACTICIDVEN Microbiology Recent Results (from the past 240 hour(s))  Culture, blood (Routine x 2)     Status: None   Collection Time: 09/16/18 12:55 AM  Result Value Ref Range Status   Specimen Description BLOOD BLOOD LEFT FOREARM  Final   Special Requests   Final    BOTTLES DRAWN AEROBIC AND ANAEROBIC Blood Culture adequate volume   Culture   Final    NO GROWTH 5 DAYS Performed at [facility], [place] [address] [place], [place] [address]    Report Status 09/21/2018 FINAL  Final  Culture, blood  (Routine x 2)     Status: None   Collection Time: 09/16/18 12:57 AM  Result Value Ref Range Status   Specimen Description BLOOD LEFT WRIST  Final   Special Requests   Final    BOTTLES DRAWN AEROBIC AND ANAEROBIC Blood Culture results may not be optimal due to an inadequate volume of blood received in culture bottles   Culture   Final    NO GROWTH 5 DAYS Performed at [facility], [place] [address], [place], [place] [address]    Report Status 09/21/2018 FINAL  Final  C difficile quick scan w PCR reflex     Status: None   Collection Time: 09/16/18  2:32 AM  Result Value Ref Range Status   C Diff antigen NEGATIVE NEGATIVE Final   C Diff toxin NEGATIVE NEGATIVE Final   C Diff interpretation No C. difficile detected.  Final    Comment: Performed at [facility], [place] [address], [place], [place] [address]  Culture, Urine     Status: None   Collection Time: 09/18/18  1:46 PM  Result Value Ref Range Status   Specimen Description URINE, CATHETERIZED  Final  Special Requests NONE  Final   Culture   Final    NO GROWTH Performed at [facility], [place] [address], [place], [place] [address]    Report Status 09/19/2018 FINAL  Final  MRSA PCR Screening     Status: None   Collection Time: 09/19/18  1:34 PM  Result Value Ref Range Status   MRSA by PCR NEGATIVE NEGATIVE Final    Comment:        The GeneXpert MRSA Assay (FDA approved for NASAL specimens only), is one component of a comprehensive MRSA colonization surveillance program. It is not intended to diagnose MRSA infection nor to guide or monitor treatment for MRSA infections. Performed at [facility], [place] [address] [place], [place] [address]   Surgical pcr screen     Status: None   Collection Time: 09/21/18 12:07 PM  Result Value Ref Range Status   MRSA, PCR NEGATIVE NEGATIVE Final   Staphylococcus aureus NEGATIVE NEGATIVE Final    Comment: (NOTE) The Xpert SA Assay (FDA approved for NASAL specimens in  patients 1 years of age and older), is one component of a comprehensive surveillance program. It is not intended to diagnose infection nor to guide or monitor treatment. Performed at [facility], [place] [address] [place], [place] [address]   Aerobic/Anaerobic Culture (surgical/deep wound)     Status: None (Preliminary result)   Collection Time: 09/21/18  2:15 PM  Result Value Ref Range Status   Specimen Description ABSCESS PERIRECTAL  Final   Special Requests NONE  Final   Gram Stain NO WBC SEEN NO ORGANISMS SEEN   Final   Culture   Final    NO GROWTH 3 DAYS NO ANAEROBES ISOLATED; CULTURE IN PROGRESS FOR 5 DAYS Performed at [facility], [place] [address] [place], [place] [address]    Report Status PENDING  Incomplete     Time coordinating discharge: 35 minutes  SIGNED:   Aline August, MD  [facility] 09/25/2018, 9:39 AM Pager: [phone]  If 7PM-7AM, please contact night-coverage [email or web address] Password [email or web address]

## 2018-09-25 NOTE — Progress Notes (Addendum)
Report called to [place] to receiving LPN, Dudley Major.

## 2018-09-25 NOTE — Progress Notes (Signed)
PHARMACY CONSULT NOTE FOR:  OUTPATIENT  PARENTERAL ANTIBIOTIC THERAPY (OPAT)  Indication: Peri-rectal abscess Regimen: Merrem 1 gm q 12 hours End date: 10/03/18  IV antibiotic discharge orders are pended. To discharging provider:  please sign these orders via discharge navigator,  Select New Orders & click on the button choice - Manage This Unsigned Work.     Thank you for allowing pharmacy to be a part of this patient's care.  Susa Raring, PharmD, BCPS Infectious Diseases Clinical Pharmacist OHCSP:[phone] 09/25/2018, 10:17 AM

## 2018-09-25 NOTE — Progress Notes (Signed)
Peripherally Inserted Central Catheter/Midline Placement  The IV Nurse has discussed with the patient and/or persons authorized to consent for the patient, the purpose of this procedure and the potential benefits and risks involved with this procedure.  The benefits include less needle sticks, lab draws from the catheter, and the patient may be discharged home with the catheter. Risks include, but not limited to, infection, bleeding, blood clot (thrombus formation), and puncture of an artery; nerve damage and irregular heartbeat and possibility to perform a PICC exchange if needed/ordered by physician.  Alternatives to this procedure were also discussed.  Bard Power PICC patient education guide, fact sheet on infection prevention and patient information card has been provided to patient /or left at bedside.    PICC/Midline Placement Documentation  PICC Single Lumen 57/97/28 PICC Right Basilic 37 cm 0 cm (Active)  Indication for Insertion or Continuance of Line Home intravenous therapies (PICC only) 09/25/2018 12:17 PM  Exposed Catheter (cm) 0 cm 09/25/2018 12:17 PM  Site Assessment Clean;Dry;Intact 09/25/2018 12:17 PM  Line Status Flushed;Saline locked;Blood return noted 09/25/2018 12:17 PM  Dressing Type Transparent 09/25/2018 12:17 PM  Dressing Status Clean;Dry;Intact;Antimicrobial disc in place 09/25/2018 12:17 PM  Dressing Change Due 10/02/18 09/25/2018 12:17 PM       [facility] Payment 09/25/2018, 12:18 PM

## 2018-09-25 NOTE — Progress Notes (Addendum)
ED CSW received handoff from floor CSW. Wound care orders being requested by facility. PTAR scheduled, pt returning to facility today. Awaiting call back pertaining wound care orders.   Wendelyn Breslow, [facility]  [phone]

## 2018-09-25 NOTE — Progress Notes (Signed)
CSW received call from Barbaraann Cao at [facility], indicating that the discharge summary that CSW sent at approximately 9:50 this morning did not have any wound care orders on it. CSW reached out to attending MD, who deferred to surgery for wound care orders. Paged Dr. Creig Hines with surgery, awaiting call back for orders.  CSW to follow and send orders to [facility] at fax number [phone].  Patient discharging to the facility today, PTAR has been scheduled.  Estanislado Emms, [place]

## 2018-09-25 NOTE — Progress Notes (Signed)
Physical Therapy Wound Treatment Patient Details  Name: Brittany [name] MRN: [id number] Date of Birth: [birth year]  Today's Date: 09/25/2018 Time: 7591-6384 Time Calculation (min): 59 min  Subjective  Subjective: Ouch Patient and Family Stated Goals: Pt didn't state Date of Onset: 09/21/18 Prior Treatments: Surgical I&D  Pain Score:  Pt premedicated. Pt grimacing and stating "ouch" with PLS and debridement.  Wound Assessment  Wound / Incision (Open or Dehisced) 09/25/18 Incision - Open Buttocks Right s/p surgical I&D (Active)  Dressing Type Gauze (Comment);Foam;Barrier Film (skin prep) 09/25/2018 11:00 AM  Dressing Changed Changed 09/25/2018 11:00 AM  Dressing Status Clean;Dry;Intact 09/25/2018 11:00 AM  Dressing Change Frequency Other (Comment) 09/25/2018 11:00 AM  Site / Wound Assessment Yellow;Pink 09/25/2018 11:00 AM  % Wound base Red or Granulating 10% 09/25/2018 11:00 AM  % Wound base Yellow/Fibrinous Exudate 90% 09/25/2018 11:00 AM  % Wound base Black/Eschar 0% 09/25/2018 11:00 AM  % Wound base Other/Granulation Tissue (Comment) 0% 09/25/2018 11:00 AM  Peri-wound Assessment Denuded;Erythema (blanchable) 09/25/2018 11:00 AM  Wound Length (cm) 3.5 cm 09/25/2018 11:00 AM  Wound Width (cm) 1.5 cm 09/25/2018 11:00 AM  Wound Depth (cm) 2 cm 09/25/2018 11:00 AM  Wound Volume (cm^3) 10.5 cm^3 09/25/2018 11:00 AM  Wound Surface Area (cm^2) 5.25 cm^2 09/25/2018 11:00 AM  Undermining (cm) Undermining around entire wound from 2-5 cm with most at 1 o'clock and 7 o'clock 09/25/2018 11:00 AM  Margins Unattached edges (unapproximated) 09/25/2018 11:00 AM  Closure None 09/25/2018 11:00 AM  Drainage Amount Minimal 09/25/2018 11:00 AM  Drainage Description No odor;Serous 09/25/2018 11:00 AM  Treatment Debridement (Selective);Hydrotherapy (Pulse lavage);Packing (Saline gauze) 09/25/2018 11:00 AM      Hydrotherapy Pulsed lavage therapy - wound location: rt buttock Pulsed Lavage with Suction  (psi): 8 psi(8-12) Pulsed Lavage with Suction - Normal Saline Used: 1000 mL Pulsed Lavage Tip: Tip with splash shield Selective Debridement Selective Debridement - Location: rt buttock Selective Debridement - Tools Used: Forceps;Scissors Selective Debridement - Tissue Removed: yellow slough   Wound Assessment and Plan  Wound Therapy - Assess/Plan/Recommendations Wound Therapy - Clinical Statement: Pt presents with open wound s/p surgical I&D for rt buttock abscess. Hydrotherapy to cleanse wound and promote removal of slough. Pt likely to dc back to skilled facility today and will continue dressing changes there.  Wound Therapy - Functional Problem List: decr sitting tolerance due to rt buttock wound Factors Delaying/Impairing Wound Healing: Incontinence;Infection - systemic/local;Immobility;Multiple medical problems Hydrotherapy Plan: Debridement;Dressing change;Patient/family education;Pulsatile lavage with suction Wound Therapy - Frequency: Other (comment)(Pt to be dc'd back to SNF today) Wound Therapy - Follow Up Recommendations: Skilled nursing facility Wound Plan: See above  Wound Therapy Goals- Improve the function of patient's integumentary system by progressing the wound(s) through the phases of wound healing (inflammation - proliferation - remodeling) by:    Goals will be updated until maximal potential achieved or discharge criteria met.  Discharge criteria: when goals achieved, discharge from hospital, MD decision/surgical intervention, no progress towards goals, refusal/missing three consecutive treatments without notification or medical reason.  GP     Brittany [name] 09/25/2018, 11:21 AM [place] Pager [phone] Office [phone]

## 2018-09-25 NOTE — NC FL2 (Signed)
[place] MEDICAID FL2 LEVEL OF CARE SCREENING TOOL     IDENTIFICATION  Patient Name: Brittany [name] Birthdate: 1924/01/28 Sex: female Admission Date (Current Location): 09/16/2018  [facility] and [place] Number:  [employer] and Address:  [facility] [place], [place] [address] [place], [place] [address]      Provider Number: [id number]  Attending Physician Name and Address:  Aline August, MD  Relative Name and Phone Number:  Roderick Sweezy [phone]     Current Level of Care: Hospital Recommended Level of Care: [place] Prior Approval Number:    Date Approved/Denied:   PASRR Number: [id number]  Discharge Plan: SNF    Current Diagnoses: Patient Active Problem List   Diagnosis Date Noted  . Pancytopenia ([place]) 09/24/2018  . Sepsis with acute renal failure without septic shock ([place])   . Palliative care encounter   . Perirectal inflammation 09/20/2018  . Fever 09/18/2018  . Sepsis secondary to UTI ([place]) 09/16/2018  . Diverticulitis 09/16/2018  . Aspiration pneumonia of right lower lobe ([place]) 08/19/2018  . AKI (acute kidney injury) ([place])   . Fatigue 07/28/2018  . Weight loss 07/28/2018  . Actinic keratosis 06/26/2018  . Diastolic CHF ([place]) 42/87/6811  . History of CVA (cerebrovascular accident) 08/08/2017  . CKD (chronic kidney disease) stage 3, GFR 30-59 ml/min (HCC) 08/08/2017  . Anemia, iron deficiency 01/11/2016  . Unstable gait   . Shoulder pain   . History of melanoma 03/20/2015  . Constipation 07/18/2014  . Vascular dementia without behavioral disturbance ([place]) 11/11/2012  . Stroke ([place]) 12/17/2011  . Back pain 04/23/2011  . COLONIC POLYPS, HX OF 06/05/2009  . Myelodysplastic syndrome ([place]) 01/02/2009  . Hypothyroidism 08/21/2007  . Hyperlipidemia 08/21/2007  . Essential hypertension 08/21/2007  . DIVERTICULOSIS, COLON 08/21/2007  . OSTEOPOROSIS 08/21/2007  . SKIN CANCER, HX OF 08/21/2007    Orientation  RESPIRATION BLADDER Height & Weight     Self, Time, Place  O2(nasal cannula 2L) Incontinent Weight: 155 lb 6.8 oz (70.5 kg) Height:  4' 11.02" (149.9 cm)  BEHAVIORAL SYMPTOMS/MOOD NEUROLOGICAL BOWEL NUTRITION STATUS      Incontinent Diet(please see DC summary)  AMBULATORY STATUS COMMUNICATION OF NEEDS Skin   Extensive Assist Verbally PU Stage and Appropriate Care(decubitis ulcer, buttocks)                       Personal Care Assistance Level of Assistance  Bathing, Feeding, Dressing Bathing Assistance: Limited assistance Feeding assistance: Limited assistance Dressing Assistance: Limited assistance     Functional Limitations Info  Sight, Hearing, Speech Sight Info: Adequate Hearing Info: Adequate Speech Info: Adequate    SPECIAL CARE FACTORS FREQUENCY  PT (By licensed PT), OT (By licensed OT)     PT Frequency: 5x/week OT Frequency: 5x/week            Contractures Contractures Info: Not present    Additional Factors Info  Code Status, Allergies Code Status Info: DNR Allergies Info: Lisinopril, Penicillins           Current Medications (09/25/2018):  This is the current hospital active medication list Current Facility-Administered Medications  Medication Dose Route Frequency Provider Last Rate Last Dose  . 0.9 %  sodium chloride infusion (Manually program via Guardrails IV Fluids)   Intravenous Once Earnstine Regal, PA-C      . 0.9 %  sodium chloride infusion   Intravenous PRN Aline August, MD 10 mL/hr at 09/24/18 2251    . acetaminophen (TYLENOL)  tablet 500 mg  500 mg Oral Q8H Dixie Dials, MD   500 mg at 09/25/18 0155  . albuterol (PROVENTIL) (2.5 MG/3ML) 0.083% nebulizer solution 2.5 mg  2.5 mg Nebulization Q6H PRN Earnstine Regal, PA-C   2.5 mg at 09/18/18 1758  . aspirin chewable tablet 81 mg  81 mg Oral Daily Earnstine Regal, PA-C   81 mg at 09/24/18 1018  . cycloSPORINE (RESTASIS) 0.05 % ophthalmic emulsion 1 drop  1 drop Both Eyes BID  Earnstine Regal, PA-C   1 drop at 09/24/18 2241  . diltiazem (CARDIZEM) tablet 60 mg  60 mg Oral Q8H Alekh, Kshitiz, MD   60 mg at 09/25/18 0200  . donepezil (ARICEPT) tablet 10 mg  10 mg Oral QHS Earnstine Regal, PA-C   10 mg at 09/24/18 2238  . guaiFENesin (ROBITUSSIN) 100 MG/5ML solution 100 mg  5 mL Oral Q4H PRN Earnstine Regal, PA-C   100 mg at 09/17/18 1352  . hydrALAZINE (APRESOLINE) injection 5 mg  5 mg Intravenous Q4H PRN Earnstine Regal, PA-C      . lactated ringers infusion   Intravenous Continuous Earnstine Regal, PA-C 50 mL/hr at 09/25/18 4975    . levothyroxine (SYNTHROID, LEVOTHROID) tablet 88 mcg  88 mcg Oral Q24H Earnstine Regal, PA-C   88 mcg at 09/25/18 3005  . meropenem (MERREM) 1 g in sodium chloride 0.9 % 100 mL IVPB  1 g Intravenous Q12H Earnstine Regal, PA-C 200 mL/hr at 09/24/18 2252 1 g at 09/24/18 2252  . metoprolol tartrate (LOPRESSOR) tablet 25 mg  25 mg Oral BID Earnstine Regal, PA-C   25 mg at 09/24/18 2236  . ondansetron (ZOFRAN) tablet 4 mg  4 mg Oral Q6H PRN Earnstine Regal, PA-C       Or  . ondansetron [facility]) injection 4 mg  4 mg Intravenous Q6H PRN Earnstine Regal, PA-C   4 mg at 09/21/18 1356  . polyvinyl alcohol (LIQUIFILM TEARS) 1.4 % ophthalmic solution 1 drop  1 drop Both Eyes Q2H PRN Earnstine Regal, PA-C      . sodium chloride flush (NS) 0.9 % injection 3 mL  3 mL Intravenous Q12H Earnstine Regal, PA-C   3 mL at 09/24/18 2255  . traMADol (ULTRAM) tablet 50 mg  50 mg Oral Q6H PRN Earnstine Regal, PA-C   50 mg at 09/21/18 2344     Discharge Medications: Please see discharge summary for a list of discharge medications.  Relevant Imaging Results:  Relevant Lab Results:   Additional Information SSN: [id number]  Estanislado Emms, LCSW

## 2018-09-25 NOTE — Progress Notes (Signed)
Patient will discharge back to [facility] SNF. Anticipated discharge date: 09/25/18 Family notified: [place] Wrightsman, son Transportation by: Corey Harold - pickup scheduled for 3:00 pm.  Nurse to call report to [phone].   CSW signing off.  Estanislado Emms, [place]  Clinical Social Worker

## 2018-09-26 LAB — CBC WITH DIFFERENTIAL/PLATELET
ABS IMMATURE GRANULOCYTES: 0.01 10*3/uL (ref 0.00–0.07)
BASOS PCT: 1 %
Basophils Absolute: 0 10*3/uL (ref 0.0–0.1)
Eosinophils Absolute: 0 10*3/uL (ref 0.0–0.5)
Eosinophils Relative: 2 %
HCT: 27.2 % — ABNORMAL LOW (ref 36.0–46.0)
Hemoglobin: 8.5 g/dL — ABNORMAL LOW (ref 12.0–15.0)
Immature Granulocytes: 1 %
Lymphocytes Relative: 55 %
Lymphs Abs: 0.7 10*3/uL (ref 0.7–4.0)
MCH: 27.7 pg (ref 26.0–34.0)
MCHC: 31.3 g/dL (ref 30.0–36.0)
MCV: 88.6 fL (ref 80.0–100.0)
MONO ABS: 0.3 10*3/uL (ref 0.1–1.0)
Monocytes Relative: 21 %
NEUTROS PCT: 20 %
Neutro Abs: 0.3 10*3/uL — ABNORMAL LOW (ref 1.7–7.7)
PLATELETS: 164 10*3/uL (ref 150–400)
RBC: 3.07 MIL/uL — AB (ref 3.87–5.11)
RDW: 17.4 % — AB (ref 11.5–15.5)
WBC: 1.3 10*3/uL — AB (ref 4.0–10.5)
nRBC: 0 % (ref 0.0–0.2)

## 2018-09-26 LAB — AEROBIC/ANAEROBIC CULTURE W GRAM STAIN (SURGICAL/DEEP WOUND): Culture: NO GROWTH

## 2018-09-26 LAB — AEROBIC/ANAEROBIC CULTURE (SURGICAL/DEEP WOUND): GRAM STAIN: NONE SEEN

## 2018-09-29 ENCOUNTER — Encounter: Payer: Self-pay | Admitting: Family Medicine

## 2018-09-29 ENCOUNTER — Non-Acute Institutional Stay (SKILLED_NURSING_FACILITY): Payer: Medicare Other | Admitting: Family Medicine

## 2018-09-29 DIAGNOSIS — I10 Essential (primary) hypertension: Secondary | ICD-10-CM

## 2018-09-29 DIAGNOSIS — J690 Pneumonitis due to inhalation of food and vomit: Secondary | ICD-10-CM

## 2018-09-29 DIAGNOSIS — F0150 Vascular dementia without behavioral disturbance: Secondary | ICD-10-CM | POA: Diagnosis not present

## 2018-09-29 DIAGNOSIS — N183 Chronic kidney disease, stage 3 unspecified: Secondary | ICD-10-CM

## 2018-09-29 DIAGNOSIS — R509 Fever, unspecified: Secondary | ICD-10-CM

## 2018-09-29 DIAGNOSIS — D469 Myelodysplastic syndrome, unspecified: Secondary | ICD-10-CM

## 2018-09-29 NOTE — Progress Notes (Signed)
Provider:  Alain Honey, MD Location:  [facility] Room Number: 37 Place of Service:  SNF (31)  PCP: Ngetich, Nelda Bucks, NP Patient Care Team: Ngetich, Nelda Bucks, NP as PCP - General (Family Medicine) Milus Banister, MD as Consulting Physician (Gastroenterology) Heath Lark, MD as Consulting Physician (Hematology and Oncology) Mast, Man X, NP as Nurse Practitioner (Internal Medicine)  Extended Emergency Contact Information Primary Emergency Contact: [facility] Address: [address]          [place], [place] [address] [name] of [place] Phone: [phone] Work Phone: [phone] Mobile Phone: [phone] Relation: Son Secondary Emergency Contact: [name],[name] Address: [place] [address] [name] of [place] Phone: [phone] Relation: Daughter  Code Status: DNR Goals of Care: Advanced Directive information Advanced Directives 09/16/2018  Does Patient Have a Medical Advance Directive? Yes  Type of Advance Directive Out of facility DNR (pink MOST or yellow form)  Does patient want to make changes to medical advance directive? -  Copy of [place] in Chart? -  Would patient like information on creating a medical advance directive? -  Pre-existing out of facility DNR order (yellow form or pink MOST form) -      Chief Complaint  Patient presents with  . New Admit To SNF    HPI: Patient is a 84 y.o. female seen today for admission to his home when asked skilled nursing facility after hospitalization from 10-19 to 09/25/2018.  She was admitted to the hospital with fever and altered mental status.  She had been hospitalized just a few weeks ago urinary tract infection and sepsis. On this hospitalization CT of the abdomen and pelvis showed acute distal sigmoid diverticulitis as well as residual infiltrate in the right lower lobe probably secondary to prior hospitalizations pneumonia.  She was  started on empiric intravenous antibiotics and an infectious disease was consulted.  Fevers persisted.  Repeat CT showed inflammation of the right gluteal cleft and antibiotics were changed to meropenem.  General surgery was consulted who I&D need left gluteal abscess on 09/21/2018.  While in the hospital, she was also seen by cardiology for new onset atrial fib with rapid ventricular response.  She was started on Cardizem drip which subsequently and subsequently switched to oral Cardizem.  She was seen in consultation by palliative care. She is discharged on 2-week course of IV antibiotics with a PICC line.  After palliative care discussion, family has decided to continue with current management outpatient palliative care follow-up.  Hospice consultation was discussed but family is not ready for that.  Past Medical History:  Diagnosis Date  . Blood transfusion 2006  . Colon polyps   . CVA (cerebral infarction) 2013  . Dementia ([place])   . Diverticulosis of colon   . DJD (degenerative joint disease)   . Hyperlipidemia   . Hypertension   . Hypothyroidism   . Melanoma ([place]) 03/20/15   removed with Moh's surgery  . Myelodysplasia 02/04/2012  . Normochromic normocytic anemia 02/04/2012  . Onychomycosis   . Osteoporosis   . Rectal prolapse   . Shoulder pain   . Unstable gait    Past Surgical History:  Procedure Laterality Date  . APPENDECTOMY    . CARPAL TUNNEL RELEASE Bilateral 2006  . FLEXIBLE SIGMOIDOSCOPY  07/09/2012   Procedure: FLEXIBLE SIGMOIDOSCOPY;  Surgeon: Inda Castle, MD;  Location: [facility];  Service: Endoscopy;  Laterality:  N/A;  . INCISION AND DRAINAGE PERIRECTAL ABSCESS N/A 09/21/2018   Procedure: IRRIGATION AND DEBRIDEMENT PERIRECTAL ABSCESS;  Surgeon: Rolm Bookbinder, MD;  Location: [facility];  Service: General;  Laterality: N/A;  . MELANOMA EXCISION Right 2005   arm  . POLYPECTOMY  2004   Laparoscopic  . RECTOCELE REPAIR    . REPLACEMENT TOTAL KNEE  2005 & 2006  .  VAGINAL HYSTERECTOMY  1989    reports that she has never smoked. She has never used smokeless tobacco. She reports that she does not drink alcohol or use drugs. Social History   Socioeconomic History  . Marital status: Married    Spouse name: Not on file  . Number of children: Not on file  . Years of education: Not on file  . Highest education level: Not on file  Occupational History  . Occupation: English as a second language teacher  Social Needs  . Financial resource strain: Not hard at all  . Food insecurity:    Worry: Never true    Inability: Never true  . Transportation needs:    Medical: No    Non-medical: No  Tobacco Use  . Smoking status: Never Smoker  . Smokeless tobacco: Never Used  Substance and Sexual Activity  . Alcohol use: No  . Drug use: No  . Sexual activity: Yes    Birth control/protection: Surgical    Comment: HYST  Lifestyle  . Physical activity:    Days per week: 0 days    Minutes per session: 0 min  . Stress: Not on file  Relationships  . Social connections:    Talks on phone: Not on file    Gets together: Not on file    Attends religious service: Not on file    Active member of club or organization: Not on file    Attends meetings of clubs or organizations: Not on file    Relationship status: Not on file  . Intimate partner violence:    Fear of current or ex partner: Not on file    Emotionally abused: Not on file    Physically abused: Not on file    Forced sexual activity: Not on file  Other Topics Concern  . Not on file  Social History Narrative   Lives at [facility], moved to [place] 12/28/14   married   Never smoked   Alcohol none   Caffeine Coffee 1 cup   Exercise none    Functional Status Survey:    Family History  Problem Relation Age of Onset  . Prostate cancer Brother   . Cancer Brother        renal cell carcinoma  . Heart disease Brother   . Breast cancer Mother 70  . Cancer Father        unknown kind  . Arthritis Daughter      Health Maintenance  Topic Date Due  . OPHTHALMOLOGY EXAM  11/15/2014  . FOOT EXAM  11/30/2014  . HEMOGLOBIN A1C  10/11/2015  . INFLUENZA VACCINE  07/16/2018  . TETANUS/TDAP  04/22/2021  . DEXA SCAN  Completed  . PNA vac Low Risk Adult  Completed    Allergies  Allergen Reactions  . Lisinopril     REACTION: angioedema  . Penicillins     REACTION: rash    Outpatient Encounter Medications as of 09/29/2018  Medication Sig  . acetaminophen (TYLENOL) 500 MG tablet Take 500 mg by mouth every 8 (eight) hours as needed for mild pain.   . Calcium Carbonate-Vit  D-Min (CALTRATE 600+D PLUS) 600-400 MG-UNIT per tablet Take 1 tablet by mouth daily.    . Cholecalciferol (VITAMIN D-3 PO) Take 1,000 mg by mouth daily.   . cycloSPORINE (RESTASIS) 0.05 % ophthalmic emulsion Place 1 drop into both eyes 2 (two) times daily.    Marland Kitchen diltiazem (CARDIZEM) 30 MG tablet Take 1 tablet (30 mg total) by mouth every 8 (eight) hours. Hold dose if heart rate less than 60  . hydroxypropyl methylcellulose / hypromellose (ISOPTO TEARS / GONIOVISC) 2.5 % ophthalmic solution Place 1 drop into both eyes every 2 (two) hours as needed for dry eyes.  Marland Kitchen levothyroxine (SYNTHROID, LEVOTHROID) 88 MCG tablet Take 1 tablet (88 mcg total) by mouth daily before breakfast.  . losartan (COZAAR) 50 MG tablet Take 1 tablet (50 mg total) by mouth daily.  . meropenem (MERREM) IVPB Inject 1 g into the vein every 12 (twelve) hours for 8 days. Indication:  Peri-rectal abscess Last Day of Therapy:  10/03/18 Labs - Once weekly:  CBC/D and BMP, Labs - Every other week:  ESR and CRP  . metoprolol tartrate (LOPRESSOR) 25 MG tablet Take 1 tablet (25 mg total) by mouth 2 (two) times daily.  . polyethylene glycol powder (GLYCOLAX/MIRALAX) powder Take 17 g by mouth daily as needed for mild constipation.   . saccharomyces boulardii (FLORASTOR) 250 MG capsule Take 250 mg by mouth 2 (two) times daily.  . Sodium Fluoride (PREVIDENT 5000 BOOSTER  PLUS) 1.1 % PSTE Place 1 application onto teeth daily.   . traMADol (ULTRAM) 50 MG tablet Take 1 tablet (50 mg total) by mouth every 6 (six) hours as needed for moderate pain.  . [DISCONTINUED] aspirin EC 81 MG tablet Take 81 mg by mouth daily.  . [DISCONTINUED] donepezil (ARICEPT) 10 MG tablet Take 10 mg by mouth daily.  . [DISCONTINUED] Multiple Vitamin (MULTIVITAMIN WITH MINERALS) TABS Take 1 tablet by mouth daily.  . [DISCONTINUED] Nutritional Supplements (NUTRITIONAL SHAKE PO) Take 120 mLs by mouth 2 (two) times daily.   No facility-administered encounter medications on file as of 09/29/2018.     Review of Systems  Constitutional: Positive for activity change, appetite change and fatigue.  HENT: Negative.   Eyes: Negative.   Respiratory: Positive for cough.   Cardiovascular: Negative.   Genitourinary: Negative.   Musculoskeletal: Positive for gait problem.  Neurological: Positive for weakness.  Hematological: Bruises/bleeds easily.  Psychiatric/Behavioral: Positive for confusion and decreased concentration.    Vitals:   09/29/18 1606  BP: 130/62  Pulse: 74  Resp: 20  Temp: (!) 97.2 F (36.2 C)  SpO2: 94%  Weight: 153 lb (69.4 kg)  Height: '4\' 11"'$  (1.499 m)   Body mass index is 30.9 kg/m. Physical Exam  Constitutional: She appears well-developed and well-nourished.  Is very drowsy.  She will not answer simple questions.  There does seem to be some discomfort related to the abscess.  HENT:  Head: Normocephalic.  Mouth/Throat: Oropharynx is clear and moist.  Eyes: Pupils are equal, round, and reactive to light. EOM are normal.  Cardiovascular: Normal rate, regular rhythm and normal heart sounds.  Pulmonary/Chest: Effort normal and breath sounds normal.  Abdominal: Soft. Bowel sounds are normal.  Neurological: No cranial nerve deficit.  Is easily arousable but not alert enough to answer simple questions and seems to have difficulty with thought processes  Skin: Skin is  warm and dry.  Psychiatric:  Patient does have a history of dementia for many years due to longevity of dementia sounds like  probable vascular Tneshia rather than Alzheimer's  Nursing note and vitals reviewed.   Labs reviewed: Basic Metabolic Panel: Recent Labs    09/23/18 0443 09/24/18 0330 09/25/18 0539  NA 141 142 141  K 4.6 4.7 4.6  CL 111 109 105  CO2 '26 28 30  '$ GLUCOSE 103* 101* 89  BUN 25* 19 14  CREATININE 0.89 0.87 0.86  CALCIUM 8.6* 8.7* 8.8*  MG 1.9 1.5* 1.7   Liver Function Tests: Recent Labs    09/17/18 0445 09/20/18 0706 09/21/18 0645  AST 43* 17 20  ALT 61* 31 25  ALKPHOS 97 72 64  BILITOT 1.0 1.1 0.6  PROT 5.2* 4.9* 5.2*  ALBUMIN 2.3* 2.2* 2.2*   No results for input(s): LIPASE, AMYLASE in the last 8760 hours. No results for input(s): AMMONIA in the last 8760 hours. CBC: Recent Labs    09/20/18 0706  09/23/18 0443 09/24/18 0725 09/25/18 0539  WBC 2.1*   < > 1.3* 1.4* 1.3*  NEUTROABS 1.7  --  0.4*  --  0.3*  HGB 8.7*   < > 8.3* 9.2* 8.5*  HCT 27.5*   < > 27.4* 29.5* 27.2*  MCV 89.3   < > 90.1 90.5 88.6  PLT 117*   < > 137* 150 164   < > = values in this interval not displayed.   Cardiac Enzymes: No results for input(s): CKTOTAL, CKMB, CKMBINDEX, TROPONINI in the last 8760 hours. BNP: Invalid input(s): POCBNP Lab Results  Component Value Date   HGBA1C 5.6 04/11/2015   Lab Results  Component Value Date   TSH 1.88 08/11/2018   Lab Results  Component Value Date   VITAMINB12 2,000 08/11/2018   Lab Results  Component Value Date   FOLATE >20.0 02/11/2007   Lab Results  Component Value Date   IRON 26 08/04/2018   TIBC 288 08/09/2014   FERRITIN 946 08/11/2018    Imaging and Procedures obtained prior to SNF admission: Ct Abdomen Pelvis Wo Contrast  Result Date: 09/16/2018 CLINICAL DATA:  Fever of unknown origin. Assess for enterovaginal fistula. History of rectal prolapse post rectocele repair. EXAM: CT ABDOMEN AND PELVIS WITHOUT  CONTRAST TECHNIQUE: Multidetector CT imaging of the abdomen and pelvis was performed following the standard protocol without IV contrast. Attempted administration of rectal contrast, however patient did not tolerate. COMPARISON:  CT 07/08/2012.  Renal ultrasound 08/16/2018 FINDINGS: Lower chest: Motion artifact with minor atelectasis. Aortic atherosclerosis. Heart size normal. Small hiatal hernia. Hepatobiliary: Mild motion artifact through the liver and gallbladder. No focal hepatic abnormality. No calcified gallstone or pericholecystic inflammation. No biliary dilatation. Pancreas: Parenchymal atrophy. No ductal dilatation or inflammation. Spleen: Normal in size without focal abnormality. Adrenals/Urinary Tract: Mild adrenal thickening without dominant mass. No hydronephrosis. Mild right perinephric edema. Probable peripheral calcified cyst in the upper right kidney measuring 18 mm. Cyst in the lower left kidney has faint peripheral calcifications. Additional cortical cysts in both kidneys suspected. Urinary bladder is physiologically distended without wall thickening. Stomach/Bowel: Motion artifact and lack of oral contrast limits detailed bowel assessment. Minimal enteric contrast within the rectum and distal sigmoid colon. No rectal contrast in the vagina. Distal colonic diverticulosis, advanced in the sigmoid colon. There is sigmoid colonic wall thickening in the area of diverticula, likely moral hypertrophy, mild pericolonic edema in the distal sigmoid. Distal to the rectum is edema in the right greater than left gluteal soft tissues without well-defined fluid collection. Appendix not visualized, surgically absent per history. Scattered fluid-filled small bowel is nondilated  and noninflamed. Vascular/Lymphatic: Aorto bi-iliac atherosclerosis. Aortic tortuosity without aneurysm. Limited assessment for adenopathy given lack contrast and paucity of intra-abdominal fat. No bulky adenopathy. Reproductive: Post  hysterectomy. Unremarkable appearance of the vaginal cuff. No rectal contrast in the vaginal cuff, however patient did not tolerate rectal contrast administration. Other: Small amount of free fluid in the left pelvis. No free air. No upper abdominal ascites. Musculoskeletal: Scoliotic curvature in degenerative change in the spine. There are no acute or suspicious osseous abnormalities. IMPRESSION: 1. Findings suspicious for acute distal sigmoid diverticulitis. No perforation or abscess. 2. Small amount of free fluid in the left pelvis, likely reactive. 3. Unremarkable appearance of the vaginal cuff. 4. Perirectal edema tracking distally in the gluteal crease without perirectal abscess or soft tissue air. 5.  Aortic Atherosclerosis (ICD10-I70.0). Electronically Signed   By: Keith Rake M.D.   On: 09/16/2018 05:39   Dg Chest 2 View  Result Date: 09/16/2018 CLINICAL DATA:  Fever EXAM: CHEST - 2 VIEW COMPARISON:  08/14/2018 FINDINGS: Patient is rotated. Possible right infrahilar opacity, adjacent to the right heart border (overlying the spine) on the frontal radiograph, possibly posterior on the lateral view. This was also present on the prior. Left lung is clear. The heart is normal in size. IMPRESSION: Possible right infrahilar opacity, raising concern for right lower lobe pneumonia, although also present on the prior study. Consider CT chest with contrast for further evaluation. Electronically Signed   By: Julian Hy M.D.   On: 09/16/2018 01:29   Ct Head Wo Contrast  Result Date: 09/16/2018 CLINICAL DATA:  84 y/o F; fever of unknown origin. Altered mental status. History of hypertension and dementia. EXAM: CT HEAD WITHOUT CONTRAST TECHNIQUE: Contiguous axial images were obtained from the base of the skull through the vertex without intravenous contrast. COMPARISON:  08/15/2018 CT head FINDINGS: Brain: No evidence of acute infarction, hemorrhage, hydrocephalus, extra-axial collection or mass  lesion/mass effect. Stable chronic microvascular ischemic changes and volume loss of the brain. Vascular: Calcific atherosclerosis of carotid siphons. No hyperdense vessel identified. Skull: Normal. Negative for fracture or focal lesion. Sinuses/Orbits: No acute finding. Other: None. IMPRESSION: 1. No acute intracranial abnormality identified. 2. Stable chronic microvascular ischemic changes and volume loss of the brain. Electronically Signed   By: Kristine Garbe M.D.   On: 09/16/2018 04:39    Assessment/Plan 1. Essential hypertension Blood pressure on admission back to skilled nursing was 130/62.  Pressures show debility.  She currently takes Cardizem for both blood pressure and atrial fibrillation as well as losartan and metoprolol discussion with son we are going to taper the metoprolol discontinue the losartan but keep Cardizem on board and follow blood pressures closely  2. Aspiration pneumonia of right lower lobe, unspecified aspiration pneumonia type ([place]) CT scan while in the hospital showed some residual infiltrate seems to have this aspiration pneumonia.  She does continue on a 2-week course of broad-spectrum antibiotic that is more for her gluteal cleft abscess  3. Vascular dementia without behavioral disturbance ([place]) Hard to assess dementia because patient responded today could be related to her dementia.  Her son is present during the exam rotation and he has noted a progressive decline more so with each of the last 2 hospitalizations.  I suggested we stop Aricept at this time as I do not think it is adding to her care but does add to her pill burden  4. CKD (chronic kidney disease) stage 3, GFR 30-59 ml/min (H[facility]) Renal function studies show stability.  We will continue to follow.  May be difficult since patient's p.o. intake is minimal at this time  5. Myelodysplastic syndrome ([place]) This is followed by hematology.  Her last counts showed Brodt blood cell count of 1.3 and  hemoglobin of 8.3  6. Fever, unspecified fever cause Fever and altered mental status prompted most recent hospitalization.  This was likely due to gluteal cleft abscess.  This was drained and she continues on antibiotic as noted above.  We will continue with PICC line and antibiotic until its burst prescribed completion at 2 weeks.   Family/ staff Communication: Her son was present at the time of exam and we spent 30 minutes talking about her prognosis plans.  Labs/tests ordered:  Lillette Boxer. Sabra Heck, [place] [address] [place], [place] Office [phone]

## 2018-09-30 ENCOUNTER — Telehealth: Payer: Self-pay

## 2018-09-30 NOTE — Telephone Encounter (Signed)
-----   Message from Heath Lark, MD sent at 09/30/2018 10:32 AM EDT ----- Regarding: call son We talked about hospice from recent hospital stay Can we cancel her appt and Aranesp?

## 2018-09-30 NOTE — Telephone Encounter (Signed)
Called son and given below message. He verbalized understanding. He will call back Friday or Monday when he knows more, he does not want to cancel appts at this time. His Mom is back at [facility]. Pall[facility] care has been consulted thru [facility], they are still waiting to meet with them. His Mom has appt with Cardiologist and surgeon tomorrow. Son states he may need to have just a phone conversation with Dr. Alvy Bimler after he knows more.

## 2018-10-01 ENCOUNTER — Encounter: Payer: Self-pay | Admitting: Nurse Practitioner

## 2018-10-01 ENCOUNTER — Non-Acute Institutional Stay: Payer: Medicare Other | Admitting: Nurse Practitioner

## 2018-10-01 VITALS — Wt 153.0 lb

## 2018-10-01 DIAGNOSIS — Z515 Encounter for palliative care: Secondary | ICD-10-CM

## 2018-10-01 DIAGNOSIS — R630 Anorexia: Secondary | ICD-10-CM

## 2018-10-01 DIAGNOSIS — R269 Unspecified abnormalities of gait and mobility: Secondary | ICD-10-CM

## 2018-10-01 DIAGNOSIS — F0150 Vascular dementia without behavioral disturbance: Secondary | ICD-10-CM

## 2018-10-01 NOTE — Progress Notes (Signed)
PALLIATIVE CARE CONSULT VISIT   PATIENT NAME: Brittany [name] DOB: 1924/03/20 MRN: [id number]  [facility] Room 39  PRIMARY CARE PROVIDER:   Ngetich, Nelda Bucks, NP  REFERRING PROVIDER:  Sandrea Hughs, NP [place], [place] [address]   Dr. Blanchie Serve Internal medicine Dr. Dixie Dials (cardiology)  RESPONSIBLE PARTY:      HISTORY OF PRESENT ILLNESS:  Brittany [name] is a 84 y.o. year old female with multiple medical problems including recent hospitalization , treated for the following: 1.sepsis 2/2 left gluteal cellulitis/abscess s/p I&D, 2. Probable RLL PNA 3. Diverticulitis 4. New onset atrial fibrillation with cardizem gtt and oral metoprolol,now in SB-SR,  vascular dementia without behaviorial disturbance; Palliative Care was asked to help address goals of care.    ASSESSMENT/PLAN   Vascular dementia without behavioral disturbance -oriented to self; staff note worsening of cognition since readmission -continue donepezil  HTN HLD Hypothyroidism Stage III CKD -metoprolol, cardizem and losartan -synthroid 69mgs  Sepsis 2/2 left gluteal cellulitis/abscess s/p I&D -PICC placed to continue meropenum 1gm Q12hrs at SNF  myelodysplastic syndrome pancytopenia -evaluated by Oncology -follow up labs at SNF and F/U with Dr. GAlvy BimlerOP  pAtrial fibrillaiton -seen by cardiology -now in SB; hold Cardizem for HR less than 60 -continue metoprolol  Decreased oral intake -staff report patient eating 25-40% of meals; has to be encouraged to eat -currently with no significant weight loss and BMI of 30 -monthly weights    RECOMMENDATIONS  1. Weekly weights  I spent 60 minutes providing this consultation,  from 09:00 to 09:30. More than 50% of the time in this consultation was spent coordinating communication.   CODE STATUS: DNR  PPS: 30% HOSPICE ELIGIBILITY/DIAGNOSIS: TBD  PAST MEDICAL HISTORY:  Past Medical History:  Diagnosis Date  . Blood transfusion 2006    . Colon polyps   . CVA (cerebral infarction) 2013  . Dementia ([place]   . Diverticulosis of colon   . DJD (degenerative joint disease)   . Hyperlipidemia   . Hypertension   . Hypothyroidism   . [place]anoma ([place] 03/20/15   removed with Moh's surgery  . Myelodysplasia 02/04/2012  . Normochromic normocytic anemia 02/04/2012  . Onychomycosis   . Osteoporosis   . Rectal prolapse   . Shoulder pain   . Unstable gait     SOCIAL HX:  Social History   Tobacco Use  . Smoking status: Never Smoker  . Smokeless tobacco: Never Used  Substance Use Topics  . Alcohol use: No    ALLERGIES:  Allergies  Allergen Reactions  . Lisinopril     REACTION: angioedema  . Penicillins     REACTION: rash     PERTINENT MEDICATIONS:  Outpatient Encounter Medications as of 10/01/2018  Medication Sig  . acetaminophen (TYLENOL) 500 MG tablet Take 500 mg by mouth every 8 (eight) hours as needed for mild pain.   . Calcium Carbonate-Vit D-Min (CALTRATE 600+D PLUS) 600-400 MG-UNIT per tablet Take 1 tablet by mouth daily.    . Cholecalciferol (VITAMIN D-3 PO) Take 1,000 mg by mouth daily.   . cycloSPORINE (RESTASIS) 0.05 % ophthalmic emulsion Place 1 drop into both eyes 2 (two) times daily.    .Marland Kitchendiltiazem (CARDIZEM) 30 MG tablet Take 1 tablet (30 mg total) by mouth every 8 (eight) hours. Hold dose if heart rate less than 60  . hydroxypropyl methylcellulose / hypromellose (ISOPTO TEARS / GONIOVISC) 2.5 % ophthalmic solution Place 1 drop into both eyes every  2 (two) hours as needed for dry eyes.  Marland Kitchen levothyroxine (SYNTHROID, LEVOTHROID) 88 MCG tablet Take 1 tablet (88 mcg total) by mouth daily before breakfast.  . losartan (COZAAR) 50 MG tablet Take 1 tablet (50 mg total) by mouth daily.  . meropenem (MERREM) IVPB Inject 1 g into the vein every 12 (twelve) hours for 8 days. Indication:  Peri-rectal abscess Last Day of Therapy:  10/03/18 Labs - Once weekly:  CBC/D and BMP, Labs - Every other week:  ESR and CRP  .  metoprolol tartrate (LOPRESSOR) 25 MG tablet Take 1 tablet (25 mg total) by mouth 2 (two) times daily.  . polyethylene glycol powder (GLYCOLAX/MIRALAX) powder Take 17 g by mouth daily as needed for mild constipation.   . saccharomyces boulardii (FLORASTOR) 250 MG capsule Take 250 mg by mouth 2 (two) times daily.  . Sodium Fluoride (PREVIDENT 5000 BOOSTER PLUS) 1.1 % PSTE Place 1 application onto teeth daily.   . traMADol (ULTRAM) 50 MG tablet Take 1 tablet (50 mg total) by mouth every 6 (six) hours as needed for moderate pain.   No facility-administered encounter medications on file as of 10/01/2018.     PHYSICAL EXAM:   General: NAD, frail appearing, thin Cardiovascular: regular rate and rhythm Pulmonary: clear ant fields Abdomen: soft, nontender, + bowel sounds GU: no suprapubic tenderness Extremities: no edema, no joint deformities Skin: no rashes Neurological: Weakness but otherwise nonfocal  Rosary Filosa G Martin[place], NP

## 2018-10-05 ENCOUNTER — Telehealth: Payer: Self-pay

## 2018-10-05 ENCOUNTER — Encounter: Payer: Self-pay | Admitting: Internal Medicine

## 2018-10-05 ENCOUNTER — Ambulatory Visit (INDEPENDENT_AMBULATORY_CARE_PROVIDER_SITE_OTHER): Payer: Medicare Other | Admitting: Internal Medicine

## 2018-10-05 VITALS — BP 96/50 | HR 64 | Resp 18

## 2018-10-05 DIAGNOSIS — N390 Urinary tract infection, site not specified: Secondary | ICD-10-CM | POA: Diagnosis not present

## 2018-10-05 DIAGNOSIS — A419 Sepsis, unspecified organism: Secondary | ICD-10-CM | POA: Diagnosis not present

## 2018-10-05 DIAGNOSIS — Z515 Encounter for palliative care: Secondary | ICD-10-CM | POA: Diagnosis not present

## 2018-10-05 DIAGNOSIS — K6289 Other specified diseases of anus and rectum: Secondary | ICD-10-CM | POA: Diagnosis not present

## 2018-10-05 NOTE — Assessment & Plan Note (Signed)
She is currently being managed partially by palliative care.

## 2018-10-05 NOTE — Assessment & Plan Note (Signed)
There was concern of a urinary tract infection but no particular symptoms at this time.

## 2018-10-05 NOTE — Assessment & Plan Note (Signed)
Some abscess initially though no particular drainage now.  Antibiotics have stopped and this can be observed off of antibiotics.  No follow-up indicated at this time.

## 2018-10-05 NOTE — Telephone Encounter (Signed)
Faxed Office visit note, consult order form that mentions stop meropenem and remove PICC line.  S.Kwanza Cancelliere, LPN

## 2018-10-05 NOTE — Progress Notes (Signed)
   Subjective:    Patient ID: Brittany [name], female    DOB: [birth year], 84 y.o.   MRN: [id number]  HPI She is here for hospital follow-up. She was seen in the hospital by my partners, Dr. Johnnye Sima and [name], she had an abscess in the perirectal area of a decubitus ulcer with some pus drainage and a negative culture and she was continued on IV meropenem after discharge, October 11.  She was continued on this per infectious disease recommendations through October 18.  She is here for follow-up.  She continues to have the PICC line in her right arm.  She does not report any significant diarrhea or rashes.  She does have baseline dementia.   Review of Systems  Constitutional: Negative for chills and fever.  Skin: Negative for rash.       Objective:   Physical Exam  Constitutional: She appears well-developed and well-nourished.  Eyes: No scleral icterus.  Cardiovascular: Normal rate, regular rhythm and normal heart sounds.  Pulmonary/Chest: Effort normal and breath sounds normal.  Musculoskeletal: She exhibits no edema.  Ulcer area noted, no significant surrounding erythema. No pus coming out and packing in place.  Skin: No rash noted.   Social history: She continues to live in a SNF.       Assessment & Plan:

## 2018-10-06 ENCOUNTER — Telehealth: Payer: Self-pay

## 2018-10-06 ENCOUNTER — Non-Acute Institutional Stay (SKILLED_NURSING_FACILITY): Payer: Medicare Other

## 2018-10-06 DIAGNOSIS — Z0000 Encounter for general adult medical examination without abnormal findings: Secondary | ICD-10-CM | POA: Diagnosis not present

## 2018-10-06 NOTE — Patient Instructions (Signed)
Brittany [name] , Thank you for taking time to come for your Medicare Wellness Visit. I appreciate your ongoing commitment to your health goals. Please review the following plan we discussed and let me know if I can assist you in the future.   Screening recommendations/referrals: Colonoscopy excluded, over age 82 Mammogram excluded, over age 36 Bone Density up to date Recommended yearly ophthalmology/optometry visit for glaucoma screening and checkup Recommended yearly dental visit for hygiene and checkup  Vaccinations: Influenza vaccine due, will receive [facility] Pneumococcal vaccine up to date, completed Tdap vaccine up to date, due 04/22/2021 Shingles vaccine not in past records    Advanced directives: in chart  Conditions/risks identified: fall risk  Next appointment: Dr. Sabra Heck makes rounds   Preventive Care 29 Years and Older, Female Preventive care refers to lifestyle choices and visits with your health care provider that can promote health and wellness. What does preventive care include?  A yearly physical exam. This is also called an annual well check.  Dental exams once or twice a year.  Routine eye exams. Ask your health care provider how often you should have your eyes checked.  Personal lifestyle choices, including:  Daily care of your teeth and gums.  Regular physical activity.  Eating a healthy diet.  Avoiding tobacco and drug use.  Limiting alcohol use.  Practicing safe sex.  Taking low-dose aspirin every day.  Taking vitamin and mineral supplements as recommended by your health care provider. What happens during an annual well check? The services and screenings done by your health care provider during your annual well check will depend on your age, overall health, lifestyle risk factors, and family history of disease. Counseling  Your health care provider may ask you questions about your:  Alcohol use.  Tobacco use.  Drug use.  Emotional  well-being.  Home and relationship well-being.  Sexual activity.  Eating habits.  History of falls.  Memory and ability to understand (cognition).  Work and work Statistician.  Reproductive health. Screening  You may have the following tests or measurements:  Height, weight, and BMI.  Blood pressure.  Lipid and cholesterol levels. These may be checked every 5 years, or more frequently if you are over 27 years old.  Skin check.  Lung cancer screening. You may have this screening every year starting at age 70 if you have a 30-pack-year history of smoking and currently smoke or have quit within the past 15 years.  Fecal occult blood test (FOBT) of the stool. You may have this test every year starting at age 35.  Flexible sigmoidoscopy or colonoscopy. You may have a sigmoidoscopy every 5 years or a colonoscopy every 10 years starting at age 57.  Hepatitis C blood test.  Hepatitis B blood test.  Sexually transmitted disease (STD) testing.  Diabetes screening. This is done by checking your blood sugar (glucose) after you have not eaten for a while (fasting). You may have this done every 1-3 years.  Bone density scan. This is done to screen for osteoporosis. You may have this done starting at age 61.  Mammogram. This may be done every 1-2 years. Talk to your health care provider about how often you should have regular mammograms. Talk with your health care provider about your test results, treatment options, and if necessary, the need for more tests. Vaccines  Your health care provider may recommend certain vaccines, such as:  Influenza vaccine. This is recommended every year.  Tetanus, diphtheria, and acellular pertussis (Tdap, Td)  vaccine. You may need a Td booster every 10 years.  Zoster vaccine. You may need this after age 42.  Pneumococcal 13-valent conjugate (PCV13) vaccine. One dose is recommended after age 4.  Pneumococcal polysaccharide (PPSV23) vaccine. One  dose is recommended after age 2. Talk to your health care provider about which screenings and vaccines you need and how often you need them. This information is not intended to replace advice given to you by your health care provider. Make sure you discuss any questions you have with your health care provider. Document Released: 12/29/2015 Document Revised: 08/21/2016 Document Reviewed: 10/03/2015 Elsevier Interactive Patient Education  2017 [employer] Prevention in the Home Falls can cause injuries. They can happen to people of all ages. There are many things you can do to make your home safe and to help prevent falls. What can I do on the outside of my home?  Regularly fix the edges of walkways and driveways and fix any cracks.  Remove anything that might make you trip as you walk through a door, such as a raised step or threshold.  Trim any bushes or trees on the path to your home.  Use bright outdoor lighting.  Clear any walking paths of anything that might make someone trip, such as rocks or tools.  Regularly check to see if handrails are loose or broken. Make sure that both sides of any steps have handrails.  Any raised decks and porches should have guardrails on the edges.  Have any leaves, snow, or ice cleared regularly.  Use sand or salt on walking paths during winter.  Clean up any spills in your garage right away. This includes oil or grease spills. What can I do in the bathroom?  Use night lights.  Install grab bars by the toilet and in the tub and shower. Do not use towel bars as grab bars.  Use non-skid mats or decals in the tub or shower.  If you need to sit down in the shower, use a plastic, non-slip stool.  Keep the floor dry. Clean up any water that spills on the floor as soon as it happens.  Remove soap buildup in the tub or shower regularly.  Attach bath mats securely with double-sided non-slip rug tape.  Do not have throw rugs and other  things on the floor that can make you trip. What can I do in the bedroom?  Use night lights.  Make sure that you have a light by your bed that is easy to reach.  Do not use any sheets or blankets that are too big for your bed. They should not hang down onto the floor.  Have a firm chair that has side arms. You can use this for support while you get dressed.  Do not have throw rugs and other things on the floor that can make you trip. What can I do in the kitchen?  Clean up any spills right away.  Avoid walking on wet floors.  Keep items that you use a lot in easy-to-reach places.  If you need to reach something above you, use a strong step stool that has a grab bar.  Keep electrical cords out of the way.  Do not use floor polish or wax that makes floors slippery. If you must use wax, use non-skid floor wax.  Do not have throw rugs and other things on the floor that can make you trip. What can I do with my stairs?  Do not leave  any items on the stairs.  Make sure that there are handrails on both sides of the stairs and use them. Fix handrails that are broken or loose. Make sure that handrails are as long as the stairways.  Check any carpeting to make sure that it is firmly attached to the stairs. Fix any carpet that is loose or worn.  Avoid having throw rugs at the top or bottom of the stairs. If you do have throw rugs, attach them to the floor with carpet tape.  Make sure that you have a light switch at the top of the stairs and the bottom of the stairs. If you do not have them, ask someone to add them for you. What else can I do to help prevent falls?  Wear shoes that:  Do not have high heels.  Have rubber bottoms.  Are comfortable and fit you well.  Are closed at the toe. Do not wear sandals.  If you use a stepladder:  Make sure that it is fully opened. Do not climb a closed stepladder.  Make sure that both sides of the stepladder are locked into place.  Ask  someone to hold it for you, if possible.  Clearly mark and make sure that you can see:  Any grab bars or handrails.  First and last steps.  Where the edge of each step is.  Use tools that help you move around (mobility aids) if they are needed. These include:  Canes.  Walkers.  Scooters.  Crutches.  Turn on the lights when you go into a dark area. Replace any light bulbs as soon as they burn out.  Set up your furniture so you have a clear path. Avoid moving your furniture around.  If any of your floors are uneven, fix them.  If there are any pets around you, be aware of where they are.  Review your medicines with your doctor. Some medicines can make you feel dizzy. This can increase your chance of falling. Ask your doctor what other things that you can do to help prevent falls. This information is not intended to replace advice given to you by your health care provider. Make sure you discuss any questions you have with your health care provider. Document Released: 09/28/2009 Document Revised: 05/09/2016 Document Reviewed: 01/06/2015 Elsevier Interactive Patient Education  2017 [employer].

## 2018-10-06 NOTE — Telephone Encounter (Signed)
At the direction of NP, phone call placed to patient's son, Brittany [name], to schedule a family meeting. Meeting scheduled for tomorrow at 1:30pm

## 2018-10-06 NOTE — Progress Notes (Signed)
Subjective:   Brittany [name] is a 84 y.o. female who presents for Medicare Annual (Subsequent) preventive examination at [place]  Last AWV-09/02/2017      Objective:     Vitals: BP 121/60 (BP Location: Right Arm, Patient Position: Supine)   Pulse 65   Temp 98 F (36.7 C) (Oral)   Ht 4\' 11"  (1.499 m)   Wt 153 lb (69.4 kg)   BMI 30.90 kg/m   Body mass index is 30.9 kg/m.  Advanced Directives 10/06/2018 09/16/2018 08/19/2018 08/14/2018 08/14/2018 07/28/2018 05/21/2018  Does Patient Have a Medical Advance Directive? Yes Yes Yes Yes - Yes Yes  Type of Advance Directive Out of facility DNR (pink MOST or yellow form) Out of facility DNR (pink MOST or yellow form) [place];Out of facility DNR (pink MOST or yellow form) Out of facility DNR (pink MOST or yellow form) Out of facility DNR (pink MOST or yellow form) Out of facility DNR (pink MOST or yellow form) Out of facility DNR (pink MOST or yellow form)  Does patient want to make changes to medical advance directive? No - Patient declined - - No - Patient declined - No - Patient declined No - Patient declined  Copy of [place] in Chart? Yes - Yes - - - -  Would patient like information on creating a medical advance directive? - - - - - - -  Pre-existing out of facility DNR order (yellow form or pink MOST form) Yellow form placed in chart (order not valid for inpatient use) - Yellow form placed in chart (order not valid for inpatient use) - Yellow form placed in chart (order not valid for inpatient use) Yellow form placed in chart (order not valid for inpatient use) Yellow form placed in chart (order not valid for inpatient use)    Tobacco Social History   Tobacco Use  Smoking Status Never Smoker  Smokeless Tobacco Never Used     Counseling given: Not Answered   Clinical Intake:  Pre-visit preparation completed: No  Pain : No/denies pain     Diabetes: No  How often do  you need to have someone help you when you read instructions, pamphlets, or other written materials from your doctor or pharmacy?: 3 - Sometimes What is the last grade level you completed in school?: college  Interpreter Needed?: No  Information entered by :: Tyson Dense, RN  Past Medical History:  Diagnosis Date  . Blood transfusion 2006  . Colon polyps   . CVA (cerebral infarction) 2013  . Dementia ([place])   . Diverticulosis of colon   . DJD (degenerative joint disease)   . Hyperlipidemia   . Hypertension   . Hypothyroidism   . Melanoma ([place]) 03/20/15   removed with Moh's surgery  . Myelodysplasia 02/04/2012  . Normochromic normocytic anemia 02/04/2012  . Onychomycosis   . Osteoporosis   . Rectal prolapse   . Shoulder pain   . Unstable gait    Past Surgical History:  Procedure Laterality Date  . APPENDECTOMY    . CARPAL TUNNEL RELEASE Bilateral 2006  . FLEXIBLE SIGMOIDOSCOPY  07/09/2012   Procedure: FLEXIBLE SIGMOIDOSCOPY;  Surgeon: Inda Castle, MD;  Location: [facility];  Service: Endoscopy;  Laterality: N/A;  . INCISION AND DRAINAGE PERIRECTAL ABSCESS N/A 09/21/2018   Procedure: IRRIGATION AND DEBRIDEMENT PERIRECTAL ABSCESS;  Surgeon: Rolm Bookbinder, MD;  Location: [facility];  Service: General;  Laterality: N/A;  . MELANOMA EXCISION Right  2005   arm  . POLYPECTOMY  2004   Laparoscopic  . RECTOCELE REPAIR    . REPLACEMENT TOTAL KNEE  2005 & 2006  . VAGINAL HYSTERECTOMY  1989   Family History  Problem Relation Age of Onset  . Prostate cancer Brother   . Cancer Brother        renal cell carcinoma  . Heart disease Brother   . Breast cancer Mother 29  . Cancer Father        unknown kind  . Arthritis Daughter    Social History   Socioeconomic History  . Marital status: Married    Spouse name: Not on file  . Number of children: Not on file  . Years of education: Not on file  . Highest education level: Not on file  Occupational History  . Occupation: Immunologist  Social Needs  . Financial resource strain: Not hard at all  . Food insecurity:    Worry: Never true    Inability: Never true  . Transportation needs:    Medical: No    Non-medical: No  Tobacco Use  . Smoking status: Never Smoker  . Smokeless tobacco: Never Used  Substance and Sexual Activity  . Alcohol use: No  . Drug use: No  . Sexual activity: Yes    Birth control/protection: Surgical    Comment: HYST  Lifestyle  . Physical activity:    Days per week: 0 days    Minutes per session: 0 min  . Stress: Not at all  Relationships  . Social connections:    Talks on phone: More than three times a week    Gets together: More than three times a week    Attends religious service: Never    Active member of club or organization: No    Attends meetings of clubs or organizations: Never    Relationship status: Married  Other Topics Concern  . Not on file  Social History Narrative   Lives at [facility], moved to [place] 12/28/14   married   Never smoked   Alcohol none   Caffeine Coffee 1 cup   Exercise none    Outpatient Encounter Medications as of 10/06/2018  Medication Sig  . acetaminophen (TYLENOL) 500 MG tablet Take 500 mg by mouth every 8 (eight) hours as needed for mild pain.   . Calcium Carbonate-Vit D-Min (CALTRATE 600+D PLUS) 600-400 MG-UNIT per tablet Take 1 tablet by mouth daily.    . Cholecalciferol (VITAMIN D-3 PO) Take 1,000 mg by mouth daily.   . cycloSPORINE (RESTASIS) 0.05 % ophthalmic emulsion Place 1 drop into both eyes 2 (two) times daily.    Marland Kitchen diltiazem (CARDIZEM) 30 MG tablet Take 1 tablet (30 mg total) by mouth every 8 (eight) hours. Hold dose if heart rate less than 60  . hydroxypropyl methylcellulose / hypromellose (ISOPTO TEARS / GONIOVISC) 2.5 % ophthalmic solution Place 1 drop into both eyes every 2 (two) hours as needed for dry eyes.  Marland Kitchen levothyroxine (SYNTHROID, LEVOTHROID) 88 MCG tablet Take 1 tablet (88 mcg total) by mouth daily before  breakfast.  . losartan (COZAAR) 50 MG tablet Take 1 tablet (50 mg total) by mouth daily.  . metoprolol tartrate (LOPRESSOR) 25 MG tablet Take 1 tablet (25 mg total) by mouth 2 (two) times daily.  . polyethylene glycol powder (GLYCOLAX/MIRALAX) powder Take 17 g by mouth daily as needed for mild constipation.   . saccharomyces boulardii (FLORASTOR) 250 MG capsule Take 250 mg by  mouth 2 (two) times daily.  . Sodium Fluoride (PREVIDENT 5000 BOOSTER PLUS) 1.1 % PSTE Place 1 application onto teeth daily.   . traMADol (ULTRAM) 50 MG tablet Take 1 tablet (50 mg total) by mouth every 6 (six) hours as needed for moderate pain.   No facility-administered encounter medications on file as of 10/06/2018.     Activities of Daily Living In your present state of health, do you have any difficulty performing the following activities: 10/06/2018 08/14/2018  Hearing? N Y  Vision? N N  Difficulty concentrating or making decisions? Tempie Donning  Walking or climbing stairs? Y N  Dressing or bathing? Y Y  Doing errands, shopping? Tempie Donning  Preparing Food and eating ? N -  Using the Toilet? N -  In the past six months, have you accidently leaked urine? N -  Do you have problems with loss of bowel control? N -  Managing your Medications? Y -  Managing your Finances? Y -  Housekeeping or managing your Housekeeping? Y -  Some recent data might be hidden    Patient Care Team: Ngetich, Nelda Bucks, NP as PCP - General (Family Medicine) Milus Banister, MD as Consulting Physician (Gastroenterology) Heath Lark, MD as Consulting Physician (Hematology and Oncology) Mast, Man X, NP as Nurse Practitioner (Internal Medicine)    Assessment:   This is a routine wellness examination for [place].  Exercise Activities and Dietary recommendations Current Exercise Habits: The patient does not participate in regular exercise at present, Exercise limited by: orthopedic condition(s)  Goals   None     Fall Risk Fall Risk  10/06/2018  10/05/2018 09/02/2017 07/04/2016 01/04/2016  Falls in the past year? No No No No No   Is the patient's home free of loose throw rugs in walkways, pet beds, electrical cords, etc?   yes      Grab bars in the bathroom? yes      Handrails on the stairs?   yes      Adequate lighting?   yes  Depression Screen PHQ 2/9 Scores 10/06/2018 10/05/2018 09/02/2017 07/04/2016  PHQ - 2 Score 0 0 0 0     Cognitive Function MMSE - Mini Mental State Exam 10/06/2018 08/12/2017 08/11/2017 07/04/2016 04/06/2015  Orientation to time 0 3 3 3 3   Orientation to Place 2 5 5 4 3   Registration 0 3 3 3 3   Attention/ Calculation 5 5 5 5 5   Recall 0 3 3 3 3   Language- name 2 objects 2 2 2 2 2   Language- repeat 1 1 1 1 1   Language- follow 3 step command 3 3 3 3 2   Language- read & follow direction 1 1 1 1 1   Write a sentence 0 1 1 1 1   Copy design 0 1 1 1 1   Total score 14 28 28 27 25         Immunization History  Administered Date(s) Administered  . Influenza Split 10/17/2011, 09/14/2012  . Influenza Whole 10/17/2007  . Influenza, High Dose Seasonal PF 10/11/2013, 10/05/2014  . Influenza-Unspecified 09/05/2015, 10/02/2016, 10/06/2017  . PPD Test 01/12/2015  . Pneumococcal Conjugate-13 09/29/2014  . Pneumococcal Polysaccharide-23 12/16/1998  . Tdap 04/23/2011  . Zoster 07/04/2006    Qualifies for Shingles Vaccine? Yes, not in past records  Screening Tests Health Maintenance  Topic Date Due  . OPHTHALMOLOGY EXAM  11/15/2014  . FOOT EXAM  11/30/2014  . HEMOGLOBIN A1C  10/11/2015  . INFLUENZA VACCINE  10/15/2018 (Originally 07/16/2018)  .  TETANUS/TDAP  04/22/2021  . DEXA SCAN  Completed  . PNA vac Low Risk Adult  Completed    Cancer Screenings: Lung: Low Dose CT Chest recommended if Age 47-80 years, 30 pack-year currently smoking OR have quit w/in 15years. Patient does not qualify. Breast:  Up to date on Mammogram? Yes   Up to date of Bone Density/Dexa? Yes Colorectal: up to date  Additional  Screenings:  Hepatitis C Screening: declined Flu vaccine due: will receive at [facility]:    I have personally reviewed and addressed the Medicare Annual Wellness questionnaire and have noted the following in the patient's chart:  A. Medical and social history B. Use of alcohol, tobacco or illicit drugs  C. Current medications and supplements D. Functional ability and status E.  Nutritional status F.  Physical activity G. Advance directives H. List of other physicians I.  Hospitalizations, surgeries, and ER visits in previous 12 months J.  [place] to include hearing, vision, cognitive, depression L. Referrals and appointments - none  In addition, I have reviewed and discussed with patient certain preventive protocols, quality metrics, and best practice recommendations. A written personalized care plan for preventive services as well as general preventive health recommendations were provided to patient.  See attached scanned questionnaire for additional information.   Signed,   Tyson Dense, RN Nurse Health Advisor  Patient Concerns: None

## 2018-10-07 ENCOUNTER — Non-Acute Institutional Stay: Payer: Medicare Other | Admitting: Nurse Practitioner

## 2018-10-07 DIAGNOSIS — Z515 Encounter for palliative care: Secondary | ICD-10-CM

## 2018-10-07 DIAGNOSIS — R269 Unspecified abnormalities of gait and mobility: Secondary | ICD-10-CM

## 2018-10-07 DIAGNOSIS — F419 Anxiety disorder, unspecified: Secondary | ICD-10-CM

## 2018-10-07 DIAGNOSIS — F0150 Vascular dementia without behavioral disturbance: Secondary | ICD-10-CM

## 2018-10-12 NOTE — Progress Notes (Signed)
This is blank note please discard. 

## 2018-10-16 ENCOUNTER — Other Ambulatory Visit (HOSPITAL_COMMUNITY)
Admission: RE | Admit: 2018-10-16 | Discharge: 2018-10-16 | Disposition: A | Discharge status: Home / Self Care | Payer: Medicare Other | Source: Other Acute Inpatient Hospital | Attending: Internal Medicine | Admitting: Internal Medicine

## 2018-10-16 ENCOUNTER — Encounter (HOSPITAL_BASED_OUTPATIENT_CLINIC_OR_DEPARTMENT_OTHER): Payer: Medicare Other | Attending: Internal Medicine

## 2018-10-16 DIAGNOSIS — I129 Hypertensive chronic kidney disease with stage 1 through stage 4 chronic kidney disease, or unspecified chronic kidney disease: Secondary | ICD-10-CM | POA: Diagnosis not present

## 2018-10-16 DIAGNOSIS — N183 Chronic kidney disease, stage 3 (moderate): Secondary | ICD-10-CM | POA: Insufficient documentation

## 2018-10-16 DIAGNOSIS — L0231 Cutaneous abscess of buttock: Secondary | ICD-10-CM | POA: Diagnosis present

## 2018-10-16 DIAGNOSIS — F0150 Vascular dementia without behavioral disturbance: Secondary | ICD-10-CM | POA: Diagnosis not present

## 2018-10-16 DIAGNOSIS — T8131XA Disruption of external operation (surgical) wound, not elsewhere classified, initial encounter: Secondary | ICD-10-CM | POA: Insufficient documentation

## 2018-10-16 DIAGNOSIS — I10 Essential (primary) hypertension: Secondary | ICD-10-CM | POA: Diagnosis not present

## 2018-10-16 DIAGNOSIS — Y838 Other surgical procedures as the cause of abnormal reaction of the patient, or of later complication, without mention of misadventure at the time of the procedure: Secondary | ICD-10-CM | POA: Diagnosis not present

## 2018-10-16 DIAGNOSIS — I4891 Unspecified atrial fibrillation: Secondary | ICD-10-CM | POA: Diagnosis not present

## 2018-10-20 LAB — AEROBIC CULTURE  (SUPERFICIAL SPECIMEN)

## 2018-10-20 LAB — AEROBIC CULTURE W GRAM STAIN (SUPERFICIAL SPECIMEN)

## 2018-10-21 ENCOUNTER — Non-Acute Institutional Stay (SKILLED_NURSING_FACILITY): Payer: Medicare Other | Admitting: Family

## 2018-10-21 ENCOUNTER — Encounter: Payer: Self-pay | Admitting: Family

## 2018-10-21 ENCOUNTER — Other Ambulatory Visit: Payer: Self-pay

## 2018-10-21 DIAGNOSIS — R2681 Unsteadiness on feet: Secondary | ICD-10-CM

## 2018-10-21 DIAGNOSIS — S31829D Unspecified open wound of left buttock, subsequent encounter: Secondary | ICD-10-CM | POA: Diagnosis not present

## 2018-10-21 DIAGNOSIS — W19XXXA Unspecified fall, initial encounter: Secondary | ICD-10-CM | POA: Diagnosis not present

## 2018-10-21 DIAGNOSIS — Y92129 Unspecified place in nursing home as the place of occurrence of the external cause: Secondary | ICD-10-CM

## 2018-10-21 DIAGNOSIS — D469 Myelodysplastic syndrome, unspecified: Secondary | ICD-10-CM

## 2018-10-22 ENCOUNTER — Inpatient Hospital Stay: Payer: Medicare Other | Attending: Hematology and Oncology

## 2018-10-22 ENCOUNTER — Inpatient Hospital Stay: Payer: Medicare Other

## 2018-10-22 VITALS — BP 145/67 | HR 58 | Temp 97.6°F | Resp 18

## 2018-10-22 DIAGNOSIS — D469 Myelodysplastic syndrome, unspecified: Secondary | ICD-10-CM

## 2018-10-22 LAB — CBC WITH DIFFERENTIAL (CANCER CENTER ONLY)
Abs Immature Granulocytes: 0.34 10*3/uL — ABNORMAL HIGH (ref 0.00–0.07)
BASOS PCT: 0 %
Basophils Absolute: 0 10*3/uL (ref 0.0–0.1)
EOS ABS: 0.3 10*3/uL (ref 0.0–0.5)
Eosinophils Relative: 2 %
HCT: 26.5 % — ABNORMAL LOW (ref 36.0–46.0)
Hemoglobin: 8.5 g/dL — ABNORMAL LOW (ref 12.0–15.0)
Immature Granulocytes: 3 %
Lymphocytes Relative: 14 %
Lymphs Abs: 1.6 10*3/uL (ref 0.7–4.0)
MCH: 28.1 pg (ref 26.0–34.0)
MCHC: 32.1 g/dL (ref 30.0–36.0)
MCV: 87.5 fL (ref 80.0–100.0)
MONO ABS: 1.1 10*3/uL — AB (ref 0.1–1.0)
MONOS PCT: 9 %
Neutro Abs: 8.6 10*3/uL — ABNORMAL HIGH (ref 1.7–7.7)
Neutrophils Relative %: 72 %
Platelet Count: 489 10*3/uL — ABNORMAL HIGH (ref 150–400)
RBC: 3.03 MIL/uL — ABNORMAL LOW (ref 3.87–5.11)
RDW: 17.6 % — AB (ref 11.5–15.5)
WBC Count: 11.9 10*3/uL — ABNORMAL HIGH (ref 4.0–10.5)
nRBC: 0 % (ref 0.0–0.2)

## 2018-10-22 MED ORDER — DARBEPOETIN ALFA 300 MCG/0.6ML IJ SOSY
300.0000 ug | PREFILLED_SYRINGE | Freq: Once | INTRAMUSCULAR | Status: AC
  Administered 2018-10-22: 300 ug via SUBCUTANEOUS

## 2018-10-22 MED ORDER — DARBEPOETIN ALFA 300 MCG/0.6ML IJ SOSY
PREFILLED_SYRINGE | INTRAMUSCULAR | Status: AC
  Filled 2018-10-22: qty 0.6

## 2018-10-23 ENCOUNTER — Encounter (HOSPITAL_BASED_OUTPATIENT_CLINIC_OR_DEPARTMENT_OTHER): Payer: Medicare Other | Attending: Internal Medicine

## 2018-10-23 DIAGNOSIS — T8131XA Disruption of external operation (surgical) wound, not elsewhere classified, initial encounter: Secondary | ICD-10-CM | POA: Diagnosis not present

## 2018-10-23 IMAGING — CT CT ABD-PELV W/O CM
2 of 4 series · 15 of 46 positions shown, 17 images · non-contrast
Comparison: 09/16/2018 CT abdomen and pelvis.

CLINICAL DATA: [AGE]/o F; suspected abscess of the anal and rectal
region.

EXAM:
CT ABDOMEN AND PELVIS WITHOUT CONTRAST
TECHNIQUE: Multidetector CT imaging of the abdomen and pelvis was performed
following the standard protocol without IV contrast.

[Series 3: abd/ pelvis 5.0 i30f 2 · axial · 0.97mm/px · z∈[+833,+1273]mm · 12 of 101 slices shown, 14 images]
[im 9/101  soft-tissue]
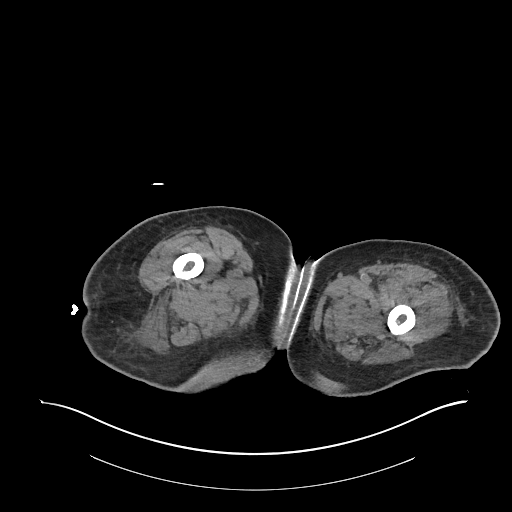
[im 9/101  bone]
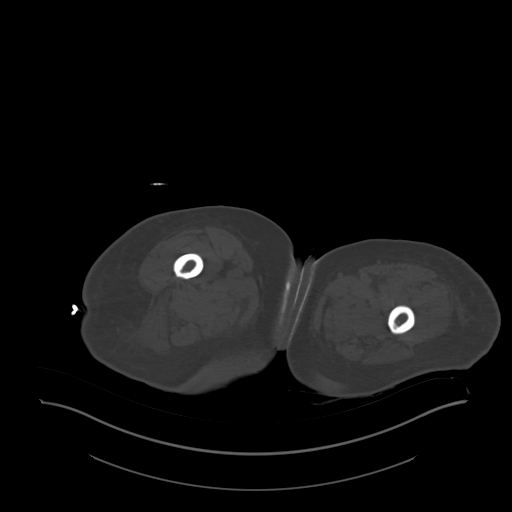
[im 17/101  soft-tissue]
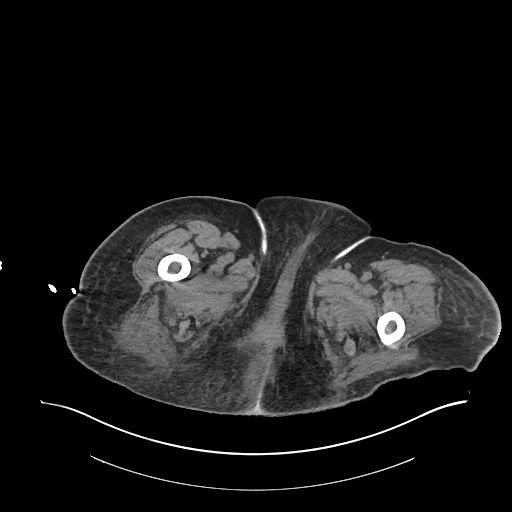
[im 25/101  soft-tissue]
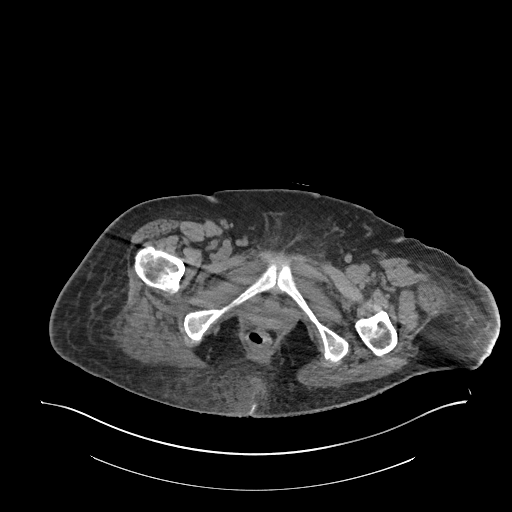
[im 33/101  soft-tissue]
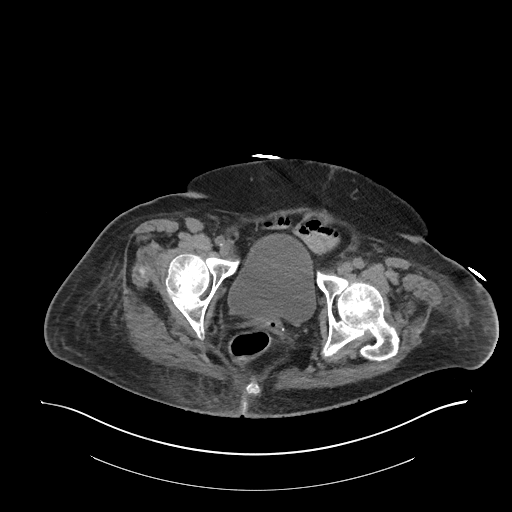
[im 41/101  soft-tissue]
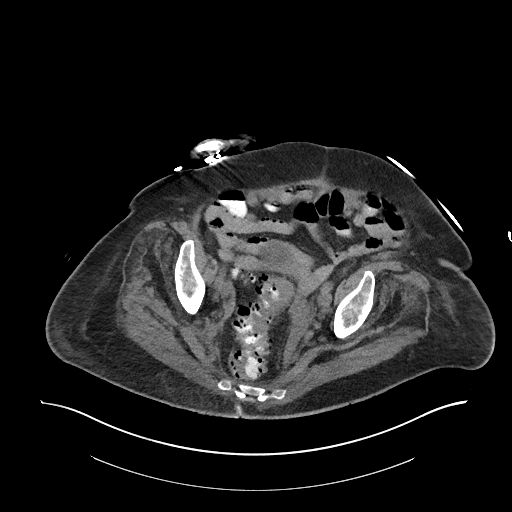
[im 49/101  soft-tissue]
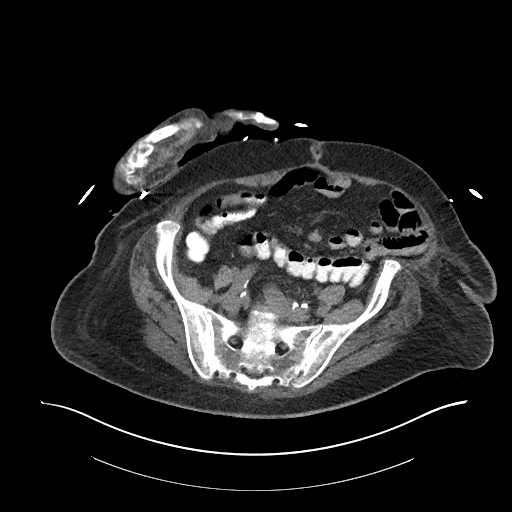
[im 57/101  soft-tissue]
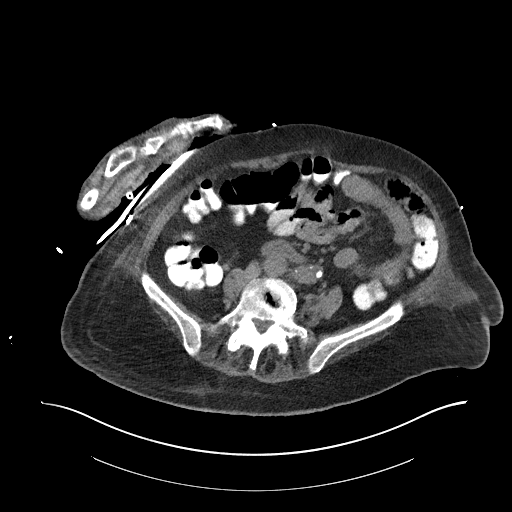
[im 65/101  soft-tissue]
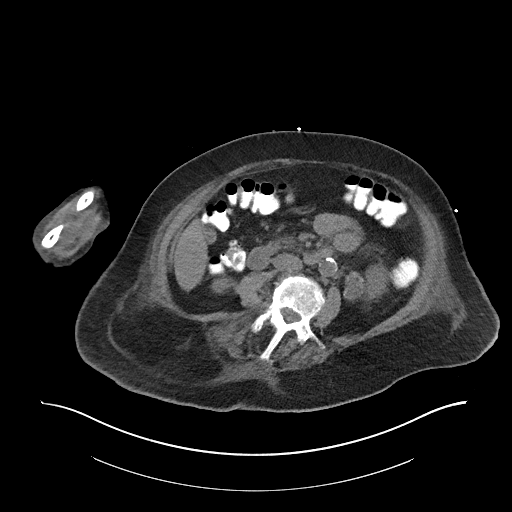
[im 73/101  soft-tissue]
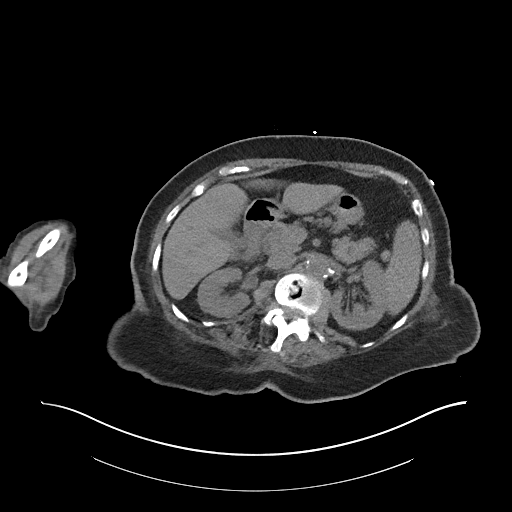
[im 73/101  bone]
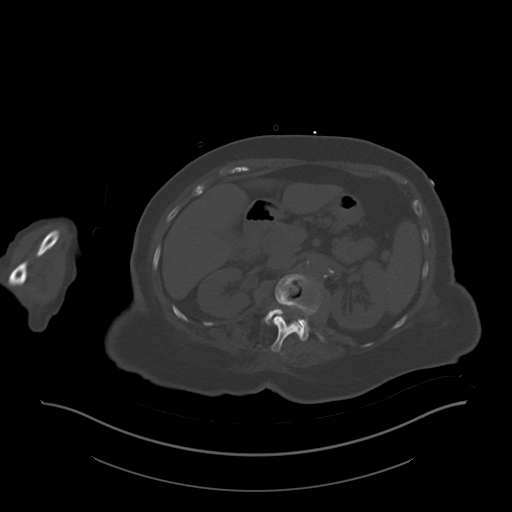
[im 81/101  soft-tissue]
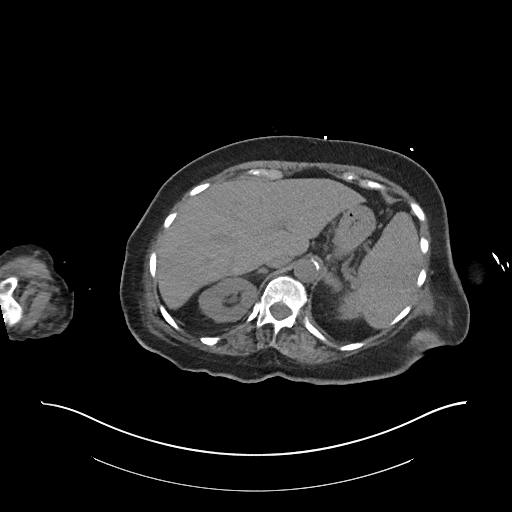
[im 89/101  soft-tissue]
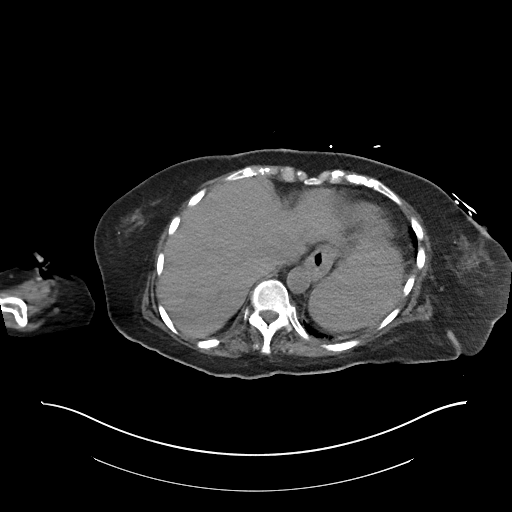
[im 97/101  soft-tissue]
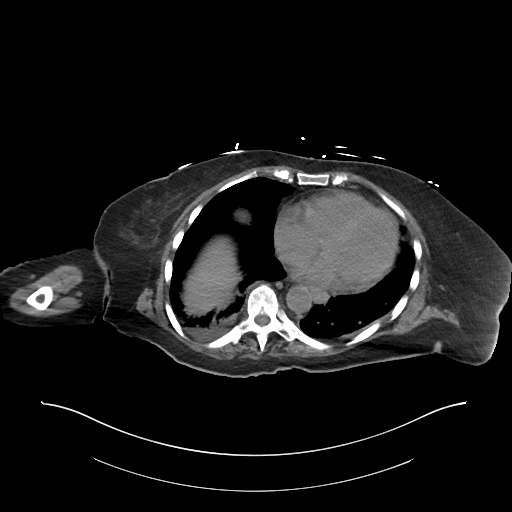

[Series 6: cor st · coronal · 0.75mm/px · 3 of 101 slices shown]
[im 34/101  soft-tissue]
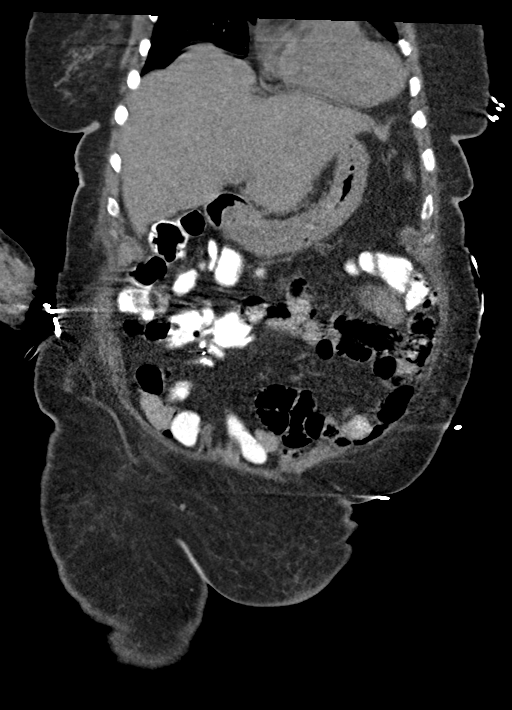
[im 45/101  soft-tissue]
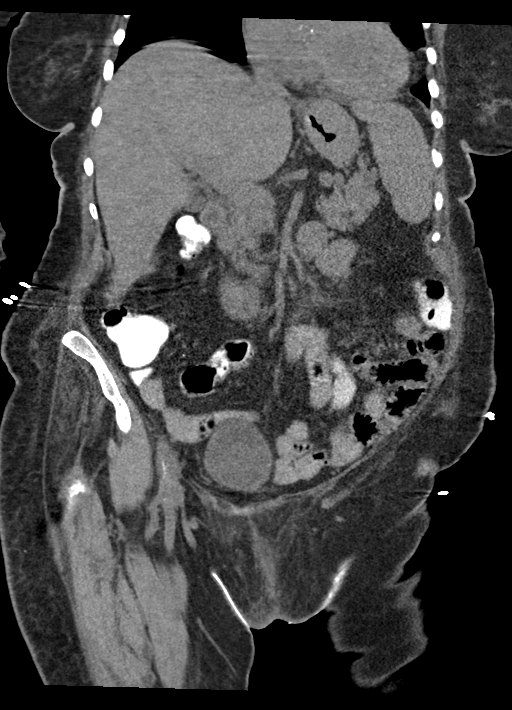
[im 56/101  soft-tissue]
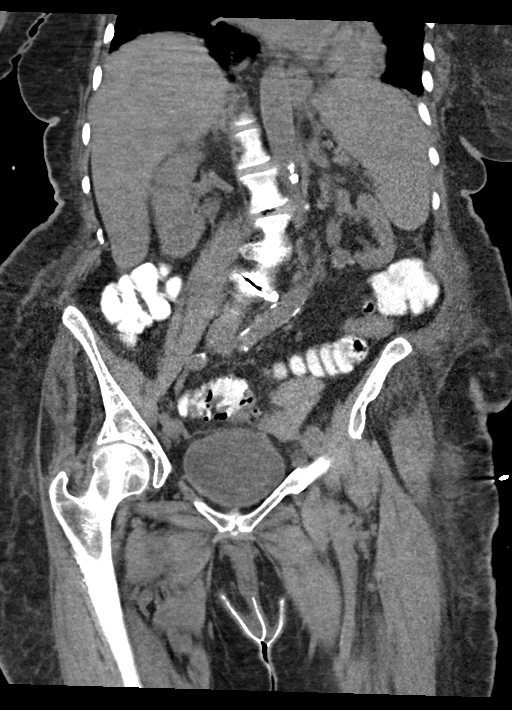

[15 of 46 positions shown; findings below may reference images not displayed]

FINDINGS: Lower chest: Small bilateral pleural effusions.

Hepatobiliary: No focal liver abnormality is seen. No gallstones,
gallbladder wall thickening, or biliary dilatation.

Pancreas: Unremarkable. No pancreatic ductal dilatation or
surrounding inflammatory changes.

Spleen: Spleen measures 12.6 x 6.7 x 10.2 cm (volume = 450 cm^3).

Adrenals/Urinary Tract: Stable mild adrenal thickening without mass.
No hydronephrosis or urinary stone disease. Left kidney lower pole
27 mm cyst with peripheral calcification. Right kidney upper pole 15
mm cyst with peripheral calcifications. Normal bladder.

Stomach/Bowel: Stomach is within normal limits. Appendix not
identified. No obstructive or inflammatory changes of the small
bowel. Mild stable wall thickening of sigmoid colon diverticulosis,
wall thickening, and surrounding pericholecystic edema. No
perforation or abscess.

Vascular/Lymphatic: Aortic atherosclerosis. No enlarged abdominal or
pelvic lymph nodes.

Reproductive: Status post hysterectomy. No adnexal masses.

Other: Stable small volume of fluid in the pelvis.

Musculoskeletal: No fracture is seen. Stable edema within the right
gluteal cleft. No discrete fluid collection is identified. Moderate
lumbar levocurvature with apex at L2. No acute osseous abnormality
identified.
IMPRESSION: 1. Stable inflammation within the right gluteal cleft. No discrete
fluid collection identified.
2. Stable findings suspicious for acute sigmoid diverticulitis.
3. New small bilateral pleural effusions.
4. Stable mild splenomegaly.
5. Aortic Atherosclerosis ([id number]).

By: Kathleen Rys M.D.

## 2018-10-25 ENCOUNTER — Encounter (HOSPITAL_COMMUNITY): Payer: Self-pay | Admitting: Emergency Medicine

## 2018-10-25 ENCOUNTER — Other Ambulatory Visit: Payer: Self-pay

## 2018-10-25 ENCOUNTER — Emergency Department (HOSPITAL_COMMUNITY)
Admission: EM | Admit: 2018-10-25 | Discharge: 2018-10-25 | Disposition: A | Discharge status: Home / Self Care | Payer: Medicare Other | Attending: Emergency Medicine | Admitting: Emergency Medicine

## 2018-10-25 DIAGNOSIS — Z85828 Personal history of other malignant neoplasm of skin: Secondary | ICD-10-CM | POA: Diagnosis not present

## 2018-10-25 DIAGNOSIS — I5030 Unspecified diastolic (congestive) heart failure: Secondary | ICD-10-CM | POA: Diagnosis not present

## 2018-10-25 DIAGNOSIS — Z79899 Other long term (current) drug therapy: Secondary | ICD-10-CM | POA: Insufficient documentation

## 2018-10-25 DIAGNOSIS — I130 Hypertensive heart and chronic kidney disease with heart failure and stage 1 through stage 4 chronic kidney disease, or unspecified chronic kidney disease: Secondary | ICD-10-CM | POA: Insufficient documentation

## 2018-10-25 DIAGNOSIS — Y92129 Unspecified place in nursing home as the place of occurrence of the external cause: Secondary | ICD-10-CM | POA: Insufficient documentation

## 2018-10-25 DIAGNOSIS — E039 Hypothyroidism, unspecified: Secondary | ICD-10-CM | POA: Insufficient documentation

## 2018-10-25 DIAGNOSIS — F0390 Unspecified dementia without behavioral disturbance: Secondary | ICD-10-CM | POA: Diagnosis not present

## 2018-10-25 DIAGNOSIS — W0110XA Fall on same level from slipping, tripping and stumbling with subsequent striking against unspecified object, initial encounter: Secondary | ICD-10-CM | POA: Diagnosis not present

## 2018-10-25 DIAGNOSIS — S0101XA Laceration without foreign body of scalp, initial encounter: Secondary | ICD-10-CM | POA: Insufficient documentation

## 2018-10-25 DIAGNOSIS — Y999 Unspecified external cause status: Secondary | ICD-10-CM | POA: Diagnosis not present

## 2018-10-25 DIAGNOSIS — S0990XA Unspecified injury of head, initial encounter: Secondary | ICD-10-CM

## 2018-10-25 DIAGNOSIS — Z7982 Long term (current) use of aspirin: Secondary | ICD-10-CM | POA: Insufficient documentation

## 2018-10-25 DIAGNOSIS — N183 Chronic kidney disease, stage 3 (moderate): Secondary | ICD-10-CM | POA: Insufficient documentation

## 2018-10-25 DIAGNOSIS — Y939 Activity, unspecified: Secondary | ICD-10-CM | POA: Insufficient documentation

## 2018-10-25 NOTE — ED Notes (Signed)
ED Provider at bedside. 

## 2018-10-25 NOTE — ED Triage Notes (Signed)
Per EMS, patient from memory care at [facility], mechanical fall witnessed by staff with laceration to top of head. Bleeding controlled. Hx dementia. Baseline per staff.

## 2018-10-25 NOTE — ED Notes (Signed)
Bed: WA08 Expected date:  Expected time:  Means of arrival:  Comments: 84 yo fall, head lac

## 2018-10-25 NOTE — ED Notes (Signed)
Attempted to call [facility] to give report and put on hold with no answer.

## 2018-10-25 NOTE — ED Notes (Signed)
[facility] called for transport.  

## 2018-10-25 NOTE — ED Provider Notes (Signed)
[facility] DEPT Provider Note   CSN: [id number] Arrival date & time: 10/25/18  1657     History   Chief Complaint Chief Complaint  Patient presents with  . Fall   Level 5 caveat: Dementia  HPI Brittany [name] is a 84 y.o. female.  HPI 84 year old female on aspirin as her only anticoagulant presents the emergency department after fall from ground height.  Close head injury.  Witnessed by staff.  Presents with a laceration to her right temporal scalp without significant hematoma.  Presents baseline mental status.  No vomiting.  No complaints of joint pain or headache.  Moves all 4 extremities equally.   Past Medical History:  Diagnosis Date  . Blood transfusion 2006  . Colon polyps   . CVA (cerebral infarction) 2013  . Dementia ([place])   . Diverticulosis of colon   . DJD (degenerative joint disease)   . Hyperlipidemia   . Hypertension   . Hypothyroidism   . Melanoma ([place]) 03/20/15   removed with Moh's surgery  . Myelodysplasia 02/04/2012  . Normochromic normocytic anemia 02/04/2012  . Onychomycosis   . Osteoporosis   . Rectal prolapse   . Shoulder pain   . Unstable gait     Patient Active Problem List   Diagnosis Date Noted  . Pancytopenia ([place]) 09/24/2018  . Palliative care encounter   . Perirectal inflammation 09/20/2018  . Weight loss 07/28/2018  . Actinic keratosis 06/26/2018  . Diastolic CHF ([place]) 11/94/1740  . History of CVA (cerebrovascular accident) 08/08/2017  . CKD (chronic kidney disease) stage 3, GFR 30-59 ml/min (HCC) 08/08/2017  . Anemia, iron deficiency 01/11/2016  . Unstable gait   . Shoulder pain   . History of melanoma 03/20/2015  . Constipation 07/18/2014  . Vascular dementia without behavioral disturbance ([place]) 11/11/2012  . Stroke ([place]) 12/17/2011  . Back pain 04/23/2011  . COLONIC POLYPS, HX OF 06/05/2009  . Myelodysplastic syndrome ([place]) 01/02/2009  . Hypothyroidism 08/21/2007  . Hyperlipidemia 08/21/2007    . Essential hypertension 08/21/2007  . DIVERTICULOSIS, COLON 08/21/2007  . OSTEOPOROSIS 08/21/2007  . SKIN CANCER, HX OF 08/21/2007    Past Surgical History:  Procedure Laterality Date  . APPENDECTOMY    . CARPAL TUNNEL RELEASE Bilateral 2006  . FLEXIBLE SIGMOIDOSCOPY  07/09/2012   Procedure: FLEXIBLE SIGMOIDOSCOPY;  Surgeon: Inda Castle, MD;  Location: [facility];  Service: Endoscopy;  Laterality: N/A;  . INCISION AND DRAINAGE PERIRECTAL ABSCESS N/A 09/21/2018   Procedure: IRRIGATION AND DEBRIDEMENT PERIRECTAL ABSCESS;  Surgeon: Rolm Bookbinder, MD;  Location: [facility];  Service: General;  Laterality: N/A;  . MELANOMA EXCISION Right 2005   arm  . POLYPECTOMY  2004   Laparoscopic  . RECTOCELE REPAIR    . REPLACEMENT TOTAL KNEE  2005 & 2006  . VAGINAL HYSTERECTOMY  1989     OB History    Gravida  4   Para  4   Term  4   Preterm      AB      Living  4     SAB      TAB      Ectopic      Multiple      Live Births               Home Medications    Prior to Admission medications   Medication Sig Start Date End Date Taking? Authorizing Provider  acetaminophen (TYLENOL) 500 MG tablet Take 500 mg by mouth every 8 (  eight) hours as needed for mild pain.  05/31/13  Yes Marletta Lor, MD  aspirin EC 81 MG tablet Take 81 mg by mouth daily.   Yes [provider]  cycloSPORINE (RESTASIS) 0.05 % ophthalmic emulsion Place 1 drop into both eyes 2 (two) times daily.     Yes [provider]  donepezil (ARICEPT) 10 MG tablet Take 10 mg by mouth at bedtime.   Yes [provider]  doxycycline (DORYX) 100 MG EC tablet Take 100 mg by mouth 2 (two) times daily. 10/23/18 11/02/18 Yes [provider]  hydroxypropyl methylcellulose / hypromellose (ISOPTO TEARS / GONIOVISC) 2.5 % ophthalmic solution Place 1 drop into both eyes every 2 (two) hours as needed for dry eyes.   Yes [provider]  levothyroxine (SYNTHROID, LEVOTHROID)  88 MCG tablet Take 1 tablet (88 mcg total) by mouth daily before breakfast. 09/25/18  Yes Starla Link, Kshitiz, MD  Metoprolol Tartrate 37.5 MG TABS Take 37.5 mg by mouth 2 (two) times daily.   Yes [provider]  polyethylene glycol powder (GLYCOLAX/MIRALAX) powder Take 17 g by mouth daily as needed for mild constipation.    Yes [provider]  saccharomyces boulardii (FLORASTOR) 250 MG capsule Take 250 mg by mouth 2 (two) times daily.   Yes [provider]  Sodium Fluoride (PREVIDENT 5000 BOOSTER PLUS) 1.1 % PSTE Place 1 application onto teeth daily.    Yes [provider]  traMADol (ULTRAM) 50 MG tablet Take 50 mg by mouth every 8 (eight) hours.   Yes [provider]    Family History Family History  Problem Relation Age of Onset  . Prostate cancer Brother   . Cancer Brother        renal cell carcinoma  . Heart disease Brother   . Breast cancer Mother 35  . Cancer Father        unknown kind  . Arthritis Daughter     Social History Social History   Tobacco Use  . Smoking status: Never Smoker  . Smokeless tobacco: Never Used  Substance Use Topics  . Alcohol use: No  . Drug use: No     Allergies   Lisinopril and Penicillins   Review of Systems Review of Systems  Unable to perform ROS: Dementia     Physical Exam Updated Vital Signs There were no vitals taken for this visit.  Physical Exam  Constitutional: She appears well-developed and well-nourished.  HENT:  Head: Normocephalic.  Small 1 cm laceration to the right temporal scalp without significant hematoma.  No active bleeding.  Eyes: EOM are normal.  Neck: Normal range of motion. Neck supple.  No obvious C-spine tenderness.  No cervical step-offs.  Pulmonary/Chest: Effort normal.  Abdominal: She exhibits no distension.  Musculoskeletal: Normal range of motion.  Full range of motion of bilateral shoulders, elbows, wrist.  Full range of motion of bilateral hips, knees,  ankles.  Neurological: She is alert.  Follows commands.  Answer simple questions.  5 out of 5 strength in bilateral upper and lower extremity major muscles  Psychiatric: She has a normal mood and affect.  Nursing note and vitals reviewed.    ED Treatments / Results  Labs (all labs ordered are listed, but only abnormal results are displayed) Labs Reviewed - No data to display  EKG None  Radiology No results found.  Procedures .Marland KitchenLaceration Repair Performed by: Jola Schmidt, MD Authorized by: Jola Schmidt, MD     LACERATION REPAIR Performed by: Lennette Bihari  Cathline Dowen Consent: Verbal consent obtained. Risks and benefits: risks, benefits and alternatives were discussed Patient identity confirmed: provided demographic data Time out performed prior to procedure Prepped and Draped in normal sterile fashion Wound explored Laceration Location: Right parietal scalp Laceration Length: 1 cm No Foreign Bodies seen or palpated Irrigation method: syringe Amount of cleaning: standard Skin closure: Dermabond Number of sutures or staples: Dermabond Technique: Dermabond Patient tolerance: Patient tolerated the procedure well with no immediate complications.   Medications Ordered in ED Medications - No data to display   Initial Impression / Assessment and Plan / ED Course  I have reviewed the triage vital signs and the nursing notes.  Pertinent labs & imaging results that were available during my care of the patient were reviewed by me and considered in my medical decision making (see chart for details).     Patient is overall well-appearing.  No vomiting.  Laceration repair with Dermabond.  Full range of motion of major joints.  No indication for imaging.  Patient with advanced dementia and age 56.  Likely would not be a neurosurgical candidate.  Case discussed with the patient's son who agrees with outpatient management.    Final Clinical Impressions(s) / ED Diagnoses   Final  diagnoses:  Closed head injury, initial encounter    ED Discharge Orders    None       Jola Schmidt, MD 10/25/18 1749

## 2018-10-25 NOTE — Progress Notes (Signed)
Location:  [facility] Room Number: [place] of Service:  SNF (31) Provider: Gurnoor Ursua FNP-C  Namiah Dunnavant, Nelda Bucks, NP  Patient Care Team: Roshaun Pound, Nelda Bucks, NP as PCP - General (Family Medicine) Milus Banister, MD as Consulting Physician (Gastroenterology) Heath Lark, MD as Consulting Physician (Hematology and Oncology) Mast, Man X, NP as Nurse Practitioner (Internal Medicine)  Extended Emergency Contact Information Primary Emergency Contact: [facility] Address: [address]          [place], [place] [address] [name] of [place] Phone: [phone] Work Phone: [phone] Mobile Phone: [phone] Relation: Son Secondary Emergency Contact: [name],[name] Address: [place] [address] [name] of [place] Phone: [phone] Relation: Daughter  Code Status:  DNR Goals of care: Advanced Directive information Advanced Directives 10/21/2018  Does Patient Have a Medical Advance Directive? Yes  Type of Paramedic of [place];Living will;Out of facility DNR (pink MOST or yellow form)  Does patient want to make changes to medical advance directive? No - Patient declined  Copy of [place] in Chart? Yes - validated most recent copy scanned in chart (See row information)  Would patient like information on creating a medical advance directive? -  Pre-existing out of facility DNR order (yellow form or pink MOST form) Yellow form placed in chart (order not valid for inpatient use)     Chief Complaint  Patient presents with  . Acute Visit    Post Fall    HPI:  Pt is a 84 y.o. female seen today at [facility] for an acute visit for evaluation of fall episode.she is seen in her room today with facility Nurse present at bedside.Nurse reported via [facility] that CNA observed patient lying on the floor on her right side.No injuries sustained.Nurse reports patient has had  increased confusion and refuses care sometimes.Patient denies any acute issues or pain during visit. She follows up with [facility] medical Oncology for recent newly diagnosis of myelodysplastic syndrome.Has upcoming appointment 10/22/2018. She also continue to follow up with wound center for left gluteal wound status post I&D 09/21/2018.Wound culture obtained at [facility] on 10/16/2018 results shows rare staphylococcus aureus within mixed organisms.Doxycycline 100 mg tablet twice daily x 10 days initiated by MD at wound center.  Nurse reports no fever.     Past Medical History:  Diagnosis Date  . Blood transfusion 2006  . Colon polyps   . CVA (cerebral infarction) 2013  . Dementia ([place])   . Diverticulosis of colon   . DJD (degenerative joint disease)   . Hyperlipidemia   . Hypertension   . Hypothyroidism   . Melanoma ([place]) 03/20/15   removed with Moh's surgery  . Myelodysplasia 02/04/2012  . Normochromic normocytic anemia 02/04/2012  . Onychomycosis   . Osteoporosis   . Rectal prolapse   . Shoulder pain   . Unstable gait    Past Surgical History:  Procedure Laterality Date  . APPENDECTOMY    . CARPAL TUNNEL RELEASE Bilateral 2006  . FLEXIBLE SIGMOIDOSCOPY  07/09/2012   Procedure: FLEXIBLE SIGMOIDOSCOPY;  Surgeon: Inda Castle, MD;  [facility];  Service: Endoscopy;  Laterality: N/A;  . INCISION AND DRAINAGE PERIRECTAL ABSCESS N/A 09/21/2018   Procedure: IRRIGATION AND DEBRIDEMENT PERIRECTAL ABSCESS;  Surgeon: Rolm Bookbinder, MD;  Location: [facility];  Service: General;  Laterality: N/A;  . MELANOMA EXCISION Right 2005  arm  . POLYPECTOMY  2004   Laparoscopic  . RECTOCELE REPAIR    . REPLACEMENT TOTAL KNEE  2005 & 2006  . VAGINAL HYSTERECTOMY  1989    Allergies  Allergen Reactions  . Lisinopril     REACTION: angioedema  . Penicillins     REACTION: rash    Outpatient Encounter Medications as of 10/21/2018  Medication Sig  .  acetaminophen (TYLENOL) 500 MG tablet Take 500 mg by mouth every 8 (eight) hours as needed for mild pain.   Marland Kitchen aspirin EC 81 MG tablet Take 81 mg by mouth daily.  . Calcium Carbonate-Vit D-Min (CALTRATE 600+D PLUS) 600-400 MG-UNIT per tablet Take 1 tablet by mouth daily.    . cycloSPORINE (RESTASIS) 0.05 % ophthalmic emulsion Place 1 drop into both eyes 2 (two) times daily.    . hydroxypropyl methylcellulose / hypromellose (ISOPTO TEARS / GONIOVISC) 2.5 % ophthalmic solution Place 1 drop into both eyes every 2 (two) hours as needed for dry eyes.  Marland Kitchen levothyroxine (SYNTHROID, LEVOTHROID) 88 MCG tablet Take 1 tablet (88 mcg total) by mouth daily before breakfast.  . Metoprolol Tartrate 37.5 MG TABS Take 37.5 mg by mouth 2 (two) times daily.  . polyethylene glycol powder (GLYCOLAX/MIRALAX) powder Take 17 g by mouth daily as needed for mild constipation.   . Sodium Fluoride (PREVIDENT 5000 BOOSTER PLUS) 1.1 % PSTE Place 1 application onto teeth daily.   . traMADol (ULTRAM) 50 MG tablet Take 50 mg by mouth every 8 (eight) hours.  . [DISCONTINUED] Cholecalciferol (VITAMIN D-3 PO) Take 1,000 mg by mouth daily.   . [DISCONTINUED] diltiazem (CARDIZEM) 30 MG tablet Take 1 tablet (30 mg total) by mouth every 8 (eight) hours. Hold dose if heart rate less than 60  . [DISCONTINUED] losartan (COZAAR) 50 MG tablet Take 1 tablet (50 mg total) by mouth daily.  . [DISCONTINUED] metoprolol tartrate (LOPRESSOR) 25 MG tablet Take 1 tablet (25 mg total) by mouth 2 (two) times daily.  . [DISCONTINUED] saccharomyces boulardii (FLORASTOR) 250 MG capsule Take 250 mg by mouth 2 (two) times daily.  . [DISCONTINUED] traMADol (ULTRAM) 50 MG tablet Take 1 tablet (50 mg total) by mouth every 6 (six) hours as needed for moderate pain.   No facility-administered encounter medications on file as of 10/21/2018.     Review of Systems  Unable to perform ROS: Dementia (additional information provided by facility Nurse present during  visit )  Constitutional: Negative for chills, fatigue and fever.  HENT: Negative for congestion, rhinorrhea, sinus pressure, sinus pain, sneezing and sore throat.   Eyes: Negative for discharge and redness.  Respiratory: Negative for cough, chest tightness, shortness of breath and wheezing.   Gastrointestinal: Negative for abdominal distention, abdominal pain, constipation, diarrhea, nausea and vomiting.  Genitourinary: Negative for dysuria, flank pain and urgency.  Musculoskeletal: Positive for gait problem.  Skin: Positive for wound. Negative for color change, pallor and rash.       Left gluteal surgical wound dressing changed by facility Nurse and also follows up with wound care center.   Neurological: Negative for dizziness, light-headedness and headaches.  Psychiatric/Behavioral: Positive for confusion. Negative for agitation. The patient is not nervous/anxious.     Immunization History  Administered Date(s) Administered  . Influenza Split 10/17/2011, 09/14/2012  . Influenza Whole 10/17/2007  . Influenza, High Dose Seasonal PF 10/11/2013, 10/05/2014  . Influenza-Unspecified 09/05/2015, 10/02/2016, 10/06/2017  . PPD Test 01/12/2015  . Pneumococcal Conjugate-13 09/29/2014  . Pneumococcal Polysaccharide-23 12/16/1998  . Tdap  04/23/2011  . Zoster 07/04/2006   Pertinent  Health Maintenance Due  Topic Date Due  . OPHTHALMOLOGY EXAM  11/15/2014  . FOOT EXAM  11/30/2014  . URINE MICROALBUMIN  11/30/2014  . HEMOGLOBIN A1C  10/11/2015  . INFLUENZA VACCINE  07/16/2018  . DEXA SCAN  Completed  . PNA vac Low Risk Adult  Completed   Fall Risk  10/06/2018 10/05/2018 09/02/2017 07/04/2016 01/04/2016  Falls in the past year? No No No No No   Functional Status Survey:    Vitals:   10/21/18 1358  BP: (!) 142/74  Pulse: 80  Resp: 20  Temp: 97.6 F (36.4 C)  TempSrc: Oral  SpO2: 95%  Weight: 132 lb 12.8 oz (60.2 kg)  Height: 4\' 11"  (1.499 m)   Body mass index is 26.82  kg/m. Physical Exam  Constitutional:  Elderly confused at her baseline   HENT:  Head: Normocephalic.  Right Ear: External ear normal.  Left Ear: External ear normal.  Mouth/Throat: Oropharynx is clear and moist. No oropharyngeal exudate.  Eyes: Pupils are equal, round, and reactive to light. Conjunctivae are normal. Right eye exhibits no discharge. Left eye exhibits no discharge. No scleral icterus.  Neck: Normal range of motion. No JVD present.  Cardiovascular: Intact distal pulses. Exam reveals no gallop and no friction rub.  Murmur heard. Pulmonary/Chest: Effort normal. No respiratory distress. She has no wheezes. She has no rales.  Poor air entry though not taking good deep breaths   Abdominal: Soft. Bowel sounds are normal. She exhibits no distension and no mass. There is no tenderness. There is no rebound and no guarding.  Musculoskeletal: She exhibits no tenderness.  Moves x 4 extremities.unsteady gait has FWW and Wheelchair.  Lymphadenopathy:    She has no cervical adenopathy.  Neurological: Gait abnormal.  Confused at her baseline   Skin: Skin is warm and dry. No rash noted. No erythema. No pallor.  Left gluteal wound bed red no drainage noted.surrounding skin tissue without any signs of infections.   Psychiatric: Her speech is normal and behavior is normal. Judgment and thought content normal. She exhibits abnormal recent memory.  Nursing note and vitals reviewed.   Labs reviewed: Recent Labs    09/23/18 0443 09/24/18 0330 09/25/18 0539  NA 141 142 141  K 4.6 4.7 4.6  CL 111 109 105  CO2 26 28 30   GLUCOSE 103* 101* 89  BUN 25* 19 14  CREATININE 0.89 0.87 0.86  CALCIUM 8.6* 8.7* 8.8*  MG 1.9 1.5* 1.7   Recent Labs    09/17/18 0445 09/20/18 0706 09/21/18 0645  AST 43* 17 20  ALT 61* 31 25  ALKPHOS 97 72 64  BILITOT 1.0 1.1 0.6  PROT 5.2* 4.9* 5.2*  ALBUMIN 2.3* 2.2* 2.2*   Recent Labs    09/23/18 0443 09/24/18 0725 09/25/18 0539 10/22/18 1016   WBC 1.3* 1.4* 1.3* 11.9*  NEUTROABS 0.4*  --  0.3* 8.6*  HGB 8.3* 9.2* 8.5* 8.5*  HCT 27.4* 29.5* 27.2* 26.5*  MCV 90.1 90.5 88.6 87.5  PLT 137* 150 164 489*   Lab Results  Component Value Date   TSH 1.88 08/11/2018   Lab Results  Component Value Date   HGBA1C 5.6 04/11/2015   Lab Results  Component Value Date   CHOL 147 04/22/2017   HDL 47 04/22/2017   LDLCALC 77 04/22/2017   LDLDIRECT 131.2 11/30/2013   TRIG 125 04/22/2017   CHOLHDL 4 11/30/2013    Significant Diagnostic Results in  last 30 days:  No results found.  Assessment/Plan 1. Unsteady gait Continue with fall and safety precautions.continue with Physical therapy for gait training,exercise and muscle strengthening.   2. Fall at nursing home, initial encounter Observed on the floor by Nursing staff.No acute injuries.exam finding negative.continue to encourage to call for assistance using call button.she remains high risk for falls due to her poor safety awareness due to cognitive impairment with dementia.   3. Wound of gluteal cleft, left, subsequent encounter Afebrile.wound bed and surrounding skin tissues without any signs of infections.culture obtained at [facility] on 10/16/2018 results shows rare staphylococcus aureus within mixed organisms.Doxycycline 100 mg tablet twice daily x 10 days initiated by MD at wound center.continue current wound care per wound [place] orders.continue on Resource three times daily and Ar[place]ade daily to promote wound healing.monitor for signs of infections.continue to follow up with wound care Center.  Family/ staff Communication: Reviewed plan of care with patient and facility Nurse.  Labs/tests ordered: None   Fontella Shan C Bravlio Luca, NP

## 2018-10-26 ENCOUNTER — Non-Acute Institutional Stay (SKILLED_NURSING_FACILITY): Payer: Medicare Other | Admitting: Family

## 2018-10-26 ENCOUNTER — Encounter: Payer: Self-pay | Admitting: Family

## 2018-10-26 DIAGNOSIS — S61411A Laceration without foreign body of right hand, initial encounter: Secondary | ICD-10-CM

## 2018-10-26 DIAGNOSIS — R2681 Unsteadiness on feet: Secondary | ICD-10-CM

## 2018-10-26 DIAGNOSIS — S0191XD Laceration without foreign body of unspecified part of head, subsequent encounter: Secondary | ICD-10-CM | POA: Diagnosis not present

## 2018-10-26 NOTE — Progress Notes (Signed)
Location:  [facility] Room Number: [place] of Service:  SNF (31) Provider: Marlan Steward FNP-C  Ulani Degrasse, Nelda Bucks, NP  Patient Care Team: Dreux Mcgroarty, Nelda Bucks, NP as PCP - General (Family Medicine) Milus Banister, MD as Consulting Physician (Gastroenterology) Heath Lark, MD as Consulting Physician (Hematology and Oncology) Mast, Man X, NP as Nurse Practitioner (Internal Medicine)  Extended Emergency Contact Information Primary Emergency Contact: [facility] Address: [address]          [place], [place] [address] [name] of [place] Phone: [phone] Work Phone: [phone] Mobile Phone: [phone] Relation: Son Secondary Emergency Contact: [name],[name] Address: [place] [address] [name] of [place] Phone: [phone] Relation: Daughter  Code Status:  DNR Goals of care: Advanced Directive information Advanced Directives 10/26/2018  Does Patient Have a Medical Advance Directive? Yes  Type of Paramedic of [place];Living will;Out of facility DNR (pink MOST or yellow form)  Does patient want to make changes to medical advance directive? No - Patient declined  Copy of Lane in Chart? Yes - validated most recent copy scanned in chart (See row information)  Would patient like information on creating a medical advance directive? -  Pre-existing out of facility DNR order (yellow form or pink MOST form) Yellow form placed in chart (order not valid for inpatient use)     Chief Complaint  Patient presents with  . Acute Visit    ED follow up    HPI:  Pt is a 84 y.o. female seen today at [facility] for an acute visit for follow up ED visit 10/25/2018 post fall episode at [facility] skilled Nursing facility.she sustained a laceration to right temporal scalp without any hematoma.Laceration was closed in the ED with derma bond.she was discharge back to SNF to  continue with rehabilitation.Her mental status remains at baseline.she denies any headache,dizziness,nausea or vomiting.Also denies any fever,chills,cough or urinary tract infections symptoms though her HPI and ROS limited due to her cognitive impairment.Facility Nurse reports patient continues to try to get up without any assistance.  Past Medical History:  Diagnosis Date  . Blood transfusion 2006  . Colon polyps   . CVA (cerebral infarction) 2013  . Dementia ([place])   . Diverticulosis of colon   . DJD (degenerative joint disease)   . Hyperlipidemia   . Hypertension   . Hypothyroidism   . Melanoma ([place]) 03/20/15   removed with Moh's surgery  . Myelodysplasia 02/04/2012  . Normochromic normocytic anemia 02/04/2012  . Onychomycosis   . Osteoporosis   . Rectal prolapse   . Shoulder pain   . Unstable gait    Past Surgical History:  Procedure Laterality Date  . APPENDECTOMY    . CARPAL TUNNEL RELEASE Bilateral 2006  . FLEXIBLE SIGMOIDOSCOPY  07/09/2012   Procedure: FLEXIBLE SIGMOIDOSCOPY;  Surgeon: Inda Castle, MD;  Location: [facility];  Service: Endoscopy;  Laterality: N/A;  . INCISION AND DRAINAGE PERIRECTAL ABSCESS N/A 09/21/2018   Procedure: IRRIGATION AND DEBRIDEMENT PERIRECTAL ABSCESS;  Surgeon: Rolm Bookbinder, MD;  Location: [facility];  Service: General;  Laterality: N/A;  . MELANOMA EXCISION Right 2005   arm  . POLYPECTOMY  2004   Laparoscopic  . RECTOCELE REPAIR    . REPLACEMENT TOTAL KNEE  2005 & 2006  . VAGINAL HYSTERECTOMY  1989    Allergies  Allergen Reactions  . Lisinopril  REACTION: angioedema  . Penicillins     REACTION: rash    Outpatient Encounter Medications as of 10/26/2018  Medication Sig  . acetaminophen (TYLENOL) 500 MG tablet Take 500 mg by mouth every 8 (eight) hours as needed for mild pain.   Marland Kitchen aspirin EC 81 MG tablet Take 81 mg by mouth daily.  . cycloSPORINE (RESTASIS) 0.05 % ophthalmic emulsion Place 1 drop into both eyes 2 (two) times  daily.    Marland Kitchen donepezil (ARICEPT) 10 MG tablet Take 10 mg by mouth at bedtime.  Marland Kitchen doxycycline (DORYX) 100 MG EC tablet Take 100 mg by mouth 2 (two) times daily.  . hydroxypropyl methylcellulose / hypromellose (ISOPTO TEARS / GONIOVISC) 2.5 % ophthalmic solution Place 1 drop into both eyes every 2 (two) hours as needed for dry eyes.  Marland Kitchen levothyroxine (SYNTHROID, LEVOTHROID) 88 MCG tablet Take 1 tablet (88 mcg total) by mouth daily before breakfast.  . Metoprolol Tartrate 37.5 MG TABS Take 37.5 mg by mouth 2 (two) times daily.  . polyethylene glycol powder (GLYCOLAX/MIRALAX) powder Take 17 g by mouth daily as needed for mild constipation.   . saccharomyces boulardii (FLORASTOR) 250 MG capsule Take 250 mg by mouth 2 (two) times daily.  . Sodium Fluoride (PREVIDENT 5000 BOOSTER PLUS) 1.1 % PSTE Place 1 application onto teeth daily.   . traMADol (ULTRAM) 50 MG tablet Take 50 mg by mouth every 8 (eight) hours.   No facility-administered encounter medications on file as of 10/26/2018.     Review of Systems  Unable to perform ROS: Dementia (additional information provided by facility Nurse )  Constitutional: Negative for appetite change, chills, fatigue and fever.  HENT: Negative for congestion, hearing loss, postnasal drip, rhinorrhea, sinus pressure, sinus pain, sneezing and sore throat.   Eyes: Negative for discharge, redness and itching.  Respiratory: Negative for cough, chest tightness, shortness of breath and wheezing.   Cardiovascular: Negative for chest pain, palpitations and leg swelling.  Gastrointestinal: Negative for abdominal distention, abdominal pain, constipation, diarrhea, nausea and vomiting.  Genitourinary: Negative for dysuria, flank pain and urgency.  Musculoskeletal: Positive for gait problem.  Skin: Negative for color change, pallor and rash.       1. Left gluteal wound dressing managed by Nursing and wound care center 2. Right hand skin tear  3. Right temporal laceration site    Neurological: Negative for dizziness, light-headedness and headaches.  Psychiatric/Behavioral: Negative for agitation and sleep disturbance. The patient is not nervous/anxious.        Confused at her baseline     Immunization History  Administered Date(s) Administered  . Influenza Split 10/17/2011, 09/14/2012  . Influenza Whole 10/17/2007  . Influenza, High Dose Seasonal PF 10/11/2013, 10/05/2014  . Influenza-Unspecified 09/05/2015, 10/02/2016, 10/06/2017  . PPD Test 01/12/2015  . Pneumococcal Conjugate-13 09/29/2014  . Pneumococcal Polysaccharide-23 12/16/1998  . Tdap 04/23/2011  . Zoster 07/04/2006   Pertinent  Health Maintenance Due  Topic Date Due  . OPHTHALMOLOGY EXAM  11/15/2014  . FOOT EXAM  11/30/2014  . URINE MICROALBUMIN  11/30/2014  . HEMOGLOBIN A1C  10/11/2015  . INFLUENZA VACCINE  07/16/2018  . DEXA SCAN  Completed  . PNA vac Low Risk Adult  Completed   Fall Risk  10/06/2018 10/05/2018 09/02/2017 07/04/2016 01/04/2016  Falls in the past year? No No No No No    Vitals:   10/26/18 1334  BP: (!) 144/76  Pulse: 78  Resp: 20  Temp: 98.1 F (36.7 C)  TempSrc: Oral  SpO2:  96%  Weight: 132 lb 12.8 oz (60.2 kg)  Height: 4\' 11"  (1.499 m)   Body mass index is 26.82 kg/m. Physical Exam  Constitutional:  Elderly,in no acute distress.confused at her baseline.  HENT:  Head: Normocephalic.  Mouth/Throat: Oropharynx is clear and moist. No oropharyngeal exudate.  Eyes: Pupils are equal, round, and reactive to light. Conjunctivae are normal. Right eye exhibits no discharge. Left eye exhibits no discharge. No scleral icterus.  Neck: Normal range of motion. No JVD present.  Cardiovascular: Intact distal pulses. Exam reveals no gallop and no friction rub.  Murmur heard. Pulmonary/Chest: Effort normal and breath sounds normal. No respiratory distress. She has no wheezes. She has no rales.  Abdominal: Soft. Bowel sounds are normal. She exhibits no distension and no mass.  There is no tenderness. There is no rebound and no guarding.  Musculoskeletal: She exhibits no edema or tenderness.  Moves x 4 extremities chronic limited ROM to both shoulder.unsteady gait uses front wheel walker for short distance.    Lymphadenopathy:    She has no cervical adenopathy.  Neurological: Gait abnormal.  Confused at her baseline   Skin: Skin is warm and dry. No rash noted. No erythema. No pallor.  1.right temporal laceration site with derm abound dry,clean and intact surrounding skin tissues without any signs of infections.  2. Right dorsal hand skin tear with steri-strips dry,clean and intact no drainage or signs of infections.   Psychiatric: She has a normal mood and affect. Her speech is normal and behavior is normal. Judgment and thought content normal. She exhibits abnormal recent memory.  Nursing note and vitals reviewed.   Labs reviewed: Recent Labs    09/23/18 0443 09/24/18 0330 09/25/18 0539  NA 141 142 141  K 4.6 4.7 4.6  CL 111 109 105  CO2 26 28 30   GLUCOSE 103* 101* 89  BUN 25* 19 14  CREATININE 0.89 0.87 0.86  CALCIUM 8.6* 8.7* 8.8*  MG 1.9 1.5* 1.7   Recent Labs    09/17/18 0445 09/20/18 0706 09/21/18 0645  AST 43* 17 20  ALT 61* 31 25  ALKPHOS 97 72 64  BILITOT 1.0 1.1 0.6  PROT 5.2* 4.9* 5.2*  ALBUMIN 2.3* 2.2* 2.2*   Recent Labs    09/23/18 0443 09/24/18 0725 09/25/18 0539 10/22/18 1016  WBC 1.3* 1.4* 1.3* 11.9*  NEUTROABS 0.4*  --  0.3* 8.6*  HGB 8.3* 9.2* 8.5* 8.5*  HCT 27.4* 29.5* 27.2* 26.5*  MCV 90.1 90.5 88.6 87.5  PLT 137* 150 164 489*   Lab Results  Component Value Date   TSH 1.88 08/11/2018   Lab Results  Component Value Date   HGBA1C 5.6 04/11/2015   Lab Results  Component Value Date   CHOL 147 04/22/2017   HDL 47 04/22/2017   LDLCALC 77 04/22/2017   LDLDIRECT 131.2 11/30/2013   TRIG 125 04/22/2017   CHOLHDL 4 11/30/2013    Significant Diagnostic Results in last 30 days:  No results  found.  Assessment/Plan 1. Unsteady gait Status post fall 10/25/2018 evaluated in the ED.Nurse reports patient continues to attempt to get up without any assistance.Fall and safety measures/precautions in place.she remains high risk for falls due to her poor safety awareness due to dementia.continue fall and safety precautions.continue with Physical /occupationa/speech Therapy.   2. Skin tear of right hand without complication, initial encounter Afebrile.steri-strips dry,clean and intact.surrounding skin tissue without any signs of infections.   3. Laceration of head without complication, subsequent encounter Afebrile.Repaired with  Derma bound in the ED 10/25/2018.site remains intact.Dry,clean and intact without any drainage.continue to monitor for signs of infections.     Family/ staff Communication: Reviewed plan of care with patient and facility Nurse.  Labs/tests ordered: None   Sky Borboa C Maelle Sheaffer, NP

## 2018-10-31 ENCOUNTER — Encounter (HOSPITAL_COMMUNITY): Payer: Self-pay

## 2018-10-31 ENCOUNTER — Inpatient Hospital Stay (HOSPITAL_COMMUNITY)
Admission: EM | Admit: 2018-10-31 | Discharge: 2018-11-04 | DRG: 533 | Disposition: A | Discharge status: Skilled Nursing Facility | Payer: Medicare Other | Source: Skilled Nursing Facility | Attending: Family Medicine | Admitting: Family Medicine

## 2018-10-31 ENCOUNTER — Other Ambulatory Visit: Payer: Self-pay

## 2018-10-31 ENCOUNTER — Inpatient Hospital Stay (HOSPITAL_COMMUNITY): Payer: Medicare Other

## 2018-10-31 ENCOUNTER — Emergency Department (HOSPITAL_COMMUNITY): Payer: Medicare Other

## 2018-10-31 DIAGNOSIS — N390 Urinary tract infection, site not specified: Secondary | ICD-10-CM | POA: Diagnosis present

## 2018-10-31 DIAGNOSIS — Z8673 Personal history of transient ischemic attack (TIA), and cerebral infarction without residual deficits: Secondary | ICD-10-CM

## 2018-10-31 DIAGNOSIS — Z79899 Other long term (current) drug therapy: Secondary | ICD-10-CM

## 2018-10-31 DIAGNOSIS — Z8719 Personal history of other diseases of the digestive system: Secondary | ICD-10-CM | POA: Diagnosis not present

## 2018-10-31 DIAGNOSIS — N183 Chronic kidney disease, stage 3 unspecified: Secondary | ICD-10-CM | POA: Diagnosis present

## 2018-10-31 DIAGNOSIS — Z85820 Personal history of malignant melanoma of skin: Secondary | ICD-10-CM

## 2018-10-31 DIAGNOSIS — S40011A Contusion of right shoulder, initial encounter: Secondary | ICD-10-CM | POA: Diagnosis present

## 2018-10-31 DIAGNOSIS — S72463A Displaced supracondylar fracture with intracondylar extension of lower end of unspecified femur, initial encounter for closed fracture: Secondary | ICD-10-CM

## 2018-10-31 DIAGNOSIS — E785 Hyperlipidemia, unspecified: Secondary | ICD-10-CM | POA: Diagnosis present

## 2018-10-31 DIAGNOSIS — S72401A Unspecified fracture of lower end of right femur, initial encounter for closed fracture: Secondary | ICD-10-CM

## 2018-10-31 DIAGNOSIS — D631 Anemia in chronic kidney disease: Secondary | ICD-10-CM | POA: Diagnosis present

## 2018-10-31 DIAGNOSIS — D62 Acute posthemorrhagic anemia: Secondary | ICD-10-CM | POA: Diagnosis present

## 2018-10-31 DIAGNOSIS — M810 Age-related osteoporosis without current pathological fracture: Secondary | ICD-10-CM | POA: Diagnosis present

## 2018-10-31 DIAGNOSIS — Z803 Family history of malignant neoplasm of breast: Secondary | ICD-10-CM

## 2018-10-31 DIAGNOSIS — Z7982 Long term (current) use of aspirin: Secondary | ICD-10-CM

## 2018-10-31 DIAGNOSIS — M9711XA Periprosthetic fracture around internal prosthetic right knee joint, initial encounter: Secondary | ICD-10-CM | POA: Diagnosis present

## 2018-10-31 DIAGNOSIS — I10 Essential (primary) hypertension: Secondary | ICD-10-CM | POA: Diagnosis present

## 2018-10-31 DIAGNOSIS — Y92129 Unspecified place in nursing home as the place of occurrence of the external cause: Secondary | ICD-10-CM

## 2018-10-31 DIAGNOSIS — E039 Hypothyroidism, unspecified: Secondary | ICD-10-CM | POA: Diagnosis present

## 2018-10-31 DIAGNOSIS — M1990 Unspecified osteoarthritis, unspecified site: Secondary | ICD-10-CM | POA: Diagnosis present

## 2018-10-31 DIAGNOSIS — Z7401 Bed confinement status: Secondary | ICD-10-CM | POA: Diagnosis not present

## 2018-10-31 DIAGNOSIS — F0150 Vascular dementia without behavioral disturbance: Secondary | ICD-10-CM | POA: Diagnosis present

## 2018-10-31 DIAGNOSIS — I5030 Unspecified diastolic (congestive) heart failure: Secondary | ICD-10-CM | POA: Diagnosis not present

## 2018-10-31 DIAGNOSIS — L98413 Non-pressure chronic ulcer of buttock with necrosis of muscle: Secondary | ICD-10-CM

## 2018-10-31 DIAGNOSIS — I5032 Chronic diastolic (congestive) heart failure: Secondary | ICD-10-CM | POA: Diagnosis present

## 2018-10-31 DIAGNOSIS — S7291XA Unspecified fracture of right femur, initial encounter for closed fracture: Secondary | ICD-10-CM | POA: Diagnosis present

## 2018-10-31 DIAGNOSIS — D61818 Other pancytopenia: Secondary | ICD-10-CM | POA: Diagnosis present

## 2018-10-31 DIAGNOSIS — Z888 Allergy status to other drugs, medicaments and biological substances status: Secondary | ICD-10-CM

## 2018-10-31 DIAGNOSIS — Z79891 Long term (current) use of opiate analgesic: Secondary | ICD-10-CM

## 2018-10-31 DIAGNOSIS — D469 Myelodysplastic syndrome, unspecified: Secondary | ICD-10-CM | POA: Diagnosis present

## 2018-10-31 DIAGNOSIS — E869 Volume depletion, unspecified: Secondary | ICD-10-CM | POA: Diagnosis present

## 2018-10-31 DIAGNOSIS — Z96653 Presence of artificial knee joint, bilateral: Secondary | ICD-10-CM | POA: Diagnosis present

## 2018-10-31 DIAGNOSIS — S72451A Displaced supracondylar fracture without intracondylar extension of lower end of right femur, initial encounter for closed fracture: Principal | ICD-10-CM | POA: Diagnosis present

## 2018-10-31 DIAGNOSIS — R296 Repeated falls: Secondary | ICD-10-CM | POA: Diagnosis present

## 2018-10-31 DIAGNOSIS — Z8249 Family history of ischemic heart disease and other diseases of the circulatory system: Secondary | ICD-10-CM

## 2018-10-31 DIAGNOSIS — W19XXXA Unspecified fall, initial encounter: Secondary | ICD-10-CM | POA: Diagnosis present

## 2018-10-31 DIAGNOSIS — L89313 Pressure ulcer of right buttock, stage 3: Secondary | ICD-10-CM | POA: Diagnosis present

## 2018-10-31 DIAGNOSIS — N179 Acute kidney failure, unspecified: Secondary | ICD-10-CM | POA: Diagnosis present

## 2018-10-31 DIAGNOSIS — S72301A Unspecified fracture of shaft of right femur, initial encounter for closed fracture: Secondary | ICD-10-CM | POA: Diagnosis present

## 2018-10-31 DIAGNOSIS — B9620 Unspecified Escherichia coli [E. coli] as the cause of diseases classified elsewhere: Secondary | ICD-10-CM | POA: Diagnosis present

## 2018-10-31 DIAGNOSIS — Z8042 Family history of malignant neoplasm of prostate: Secondary | ICD-10-CM

## 2018-10-31 DIAGNOSIS — Z66 Do not resuscitate: Secondary | ICD-10-CM | POA: Diagnosis present

## 2018-10-31 DIAGNOSIS — Z90710 Acquired absence of both cervix and uterus: Secondary | ICD-10-CM

## 2018-10-31 DIAGNOSIS — I130 Hypertensive heart and chronic kidney disease with heart failure and stage 1 through stage 4 chronic kidney disease, or unspecified chronic kidney disease: Secondary | ICD-10-CM | POA: Diagnosis present

## 2018-10-31 DIAGNOSIS — Z8261 Family history of arthritis: Secondary | ICD-10-CM

## 2018-10-31 DIAGNOSIS — Z880 Allergy status to penicillin: Secondary | ICD-10-CM

## 2018-10-31 DIAGNOSIS — S5001XA Contusion of right elbow, initial encounter: Secondary | ICD-10-CM | POA: Diagnosis present

## 2018-10-31 DIAGNOSIS — Z79890 Hormone replacement therapy (postmenopausal): Secondary | ICD-10-CM

## 2018-10-31 DIAGNOSIS — Z8051 Family history of malignant neoplasm of kidney: Secondary | ICD-10-CM

## 2018-10-31 DIAGNOSIS — D72829 Elevated white blood cell count, unspecified: Secondary | ICD-10-CM

## 2018-10-31 LAB — CBC WITH DIFFERENTIAL/PLATELET
Abs Immature Granulocytes: 0.42 10*3/uL — ABNORMAL HIGH (ref 0.00–0.07)
BASOS ABS: 0.1 10*3/uL (ref 0.0–0.1)
Basophils Relative: 1 %
EOS ABS: 0.2 10*3/uL (ref 0.0–0.5)
EOS PCT: 1 %
HCT: 30.3 % — ABNORMAL LOW (ref 36.0–46.0)
Hemoglobin: 9.5 g/dL — ABNORMAL LOW (ref 12.0–15.0)
Immature Granulocytes: 2 %
LYMPHS PCT: 11 %
Lymphs Abs: 2.2 10*3/uL (ref 0.7–4.0)
MCH: 27.8 pg (ref 26.0–34.0)
MCHC: 31.4 g/dL (ref 30.0–36.0)
MCV: 88.6 fL (ref 80.0–100.0)
Monocytes Absolute: 2.1 10*3/uL — ABNORMAL HIGH (ref 0.1–1.0)
Monocytes Relative: 11 %
NRBC: 0.6 % — AB (ref 0.0–0.2)
Neutro Abs: 14.6 10*3/uL — ABNORMAL HIGH (ref 1.7–7.7)
Neutrophils Relative %: 74 %
Platelets: 566 10*3/uL — ABNORMAL HIGH (ref 150–400)
RBC: 3.42 MIL/uL — AB (ref 3.87–5.11)
RDW: 18.3 % — ABNORMAL HIGH (ref 11.5–15.5)
WBC: 19.6 10*3/uL — AB (ref 4.0–10.5)

## 2018-10-31 LAB — BASIC METABOLIC PANEL
ANION GAP: 14 (ref 5–15)
BUN: 46 mg/dL — ABNORMAL HIGH (ref 8–23)
CO2: 18 mmol/L — ABNORMAL LOW (ref 22–32)
CREATININE: 1.54 mg/dL — AB (ref 0.44–1.00)
Calcium: 9.4 mg/dL (ref 8.9–10.3)
Chloride: 109 mmol/L (ref 98–111)
GFR calc non Af Amer: 28 mL/min — ABNORMAL LOW (ref >60)
GFR, EST AFRICAN AMERICAN: 33 mL/min — AB (ref >60)
Glucose, Bld: 146 mg/dL — ABNORMAL HIGH (ref 70–99)
Potassium: 5 mmol/L (ref 3.5–5.1)
SODIUM: 141 mmol/L (ref 135–145)

## 2018-10-31 LAB — BRAIN NATRIURETIC PEPTIDE: B Natriuretic Peptide: 64.7 pg/mL (ref 0.0–100.0)

## 2018-10-31 MED ORDER — MORPHINE SULFATE (PF) 4 MG/ML IV SOLN
4.0000 mg | Freq: Once | INTRAVENOUS | Status: AC
  Administered 2018-10-31: 4 mg via INTRAVENOUS
  Filled 2018-10-31: qty 1

## 2018-10-31 MED ORDER — MORPHINE SULFATE (PF) 2 MG/ML IV SOLN
2.0000 mg | INTRAVENOUS | Status: DC | PRN
  Administered 2018-10-31: 1 mg via INTRAVENOUS
  Administered 2018-11-01 – 2018-11-04 (×5): 2 mg via INTRAVENOUS
  Filled 2018-10-31 (×7): qty 1

## 2018-10-31 MED ORDER — SODIUM FLUORIDE 1.1 % DT PSTE: 1.0000 "application " | PASTE | Freq: Every day | DENTAL | Status: DC

## 2018-10-31 MED ORDER — HYPROMELLOSE (GONIOSCOPIC) 2.5 % OP SOLN: 1.0000 [drp] | OPHTHALMIC | Status: DC | PRN

## 2018-10-31 MED ORDER — POLYVINYL ALCOHOL 1.4 % OP SOLN
1.0000 [drp] | OPHTHALMIC | Status: DC | PRN
  Administered 2018-11-01: 1 [drp] via OPHTHALMIC
  Filled 2018-10-31: qty 15

## 2018-10-31 MED ORDER — SODIUM CHLORIDE 0.9 % IV BOLUS
1000.0000 mL | Freq: Once | INTRAVENOUS | Status: AC
  Administered 2018-10-31: 1000 mL via INTRAVENOUS

## 2018-10-31 MED ORDER — LORAZEPAM 2 MG/ML IJ SOLN
0.5000 mg | Freq: Once | INTRAMUSCULAR | Status: AC
  Administered 2018-10-31: 0.5 mg via INTRAVENOUS
  Filled 2018-10-31: qty 1

## 2018-10-31 MED ORDER — DONEPEZIL HCL 10 MG PO TABS
10.0000 mg | ORAL_TABLET | Freq: Every day | ORAL | Status: DC
  Administered 2018-11-01 – 2018-11-03 (×3): 10 mg via ORAL
  Filled 2018-10-31 (×2): qty 1
  Filled 2018-10-31: qty 2
  Filled 2018-10-31: qty 1

## 2018-10-31 MED ORDER — POLYETHYLENE GLYCOL 3350 17 G PO PACK
17.0000 g | PACK | Freq: Every day | ORAL | Status: DC | PRN
  Administered 2018-11-03: 17 g via ORAL
  Filled 2018-10-31: qty 1

## 2018-10-31 MED ORDER — SODIUM CHLORIDE 0.9 % IV SOLN
INTRAVENOUS | Status: DC
  Administered 2018-10-31: 19:00:00 via INTRAVENOUS

## 2018-10-31 MED ORDER — SACCHAROMYCES BOULARDII 250 MG PO CAPS
250.0000 mg | ORAL_CAPSULE | Freq: Two times a day (BID) | ORAL | Status: DC
  Administered 2018-11-01 – 2018-11-04 (×7): 250 mg via ORAL
  Filled 2018-10-31 (×7): qty 1

## 2018-10-31 MED ORDER — ASPIRIN EC 81 MG PO TBEC
81.0000 mg | DELAYED_RELEASE_TABLET | Freq: Every day | ORAL | Status: DC
  Administered 2018-11-01 – 2018-11-04 (×4): 81 mg via ORAL
  Filled 2018-10-31 (×4): qty 1

## 2018-10-31 MED ORDER — DOXYCYCLINE HYCLATE 100 MG PO TBEC: 100.0000 mg | DELAYED_RELEASE_TABLET | Freq: Two times a day (BID) | ORAL | Status: DC

## 2018-10-31 MED ORDER — CYCLOSPORINE 0.05 % OP EMUL
1.0000 [drp] | Freq: Two times a day (BID) | OPHTHALMIC | Status: DC
  Administered 2018-10-31 – 2018-11-04 (×8): 1 [drp] via OPHTHALMIC
  Filled 2018-10-31 (×9): qty 1

## 2018-10-31 MED ORDER — LEVOTHYROXINE SODIUM 88 MCG PO TABS
88.0000 ug | ORAL_TABLET | Freq: Every day | ORAL | Status: DC
  Administered 2018-11-01 – 2018-11-04 (×4): 88 ug via ORAL
  Filled 2018-10-31 (×4): qty 1

## 2018-10-31 MED ORDER — POLYETHYLENE GLYCOL 3350 17 GM/SCOOP PO POWD
17.0000 g | Freq: Every day | ORAL | Status: DC | PRN
  Filled 2018-10-31: qty 255

## 2018-10-31 MED ORDER — HYDRALAZINE HCL 20 MG/ML IJ SOLN
10.0000 mg | Freq: Four times a day (QID) | INTRAMUSCULAR | Status: DC | PRN
  Administered 2018-11-03: 10 mg via INTRAVENOUS
  Filled 2018-10-31: qty 1

## 2018-10-31 MED ORDER — METOPROLOL TARTRATE 37.5 MG PO TABS: 37.5000 mg | ORAL_TABLET | Freq: Two times a day (BID) | ORAL | Status: DC

## 2018-10-31 MED ORDER — METOPROLOL TARTRATE 25 MG PO TABS
37.5000 mg | ORAL_TABLET | Freq: Two times a day (BID) | ORAL | Status: DC
  Administered 2018-10-31 – 2018-11-04 (×8): 37.5 mg via ORAL
  Filled 2018-10-31 (×8): qty 1

## 2018-10-31 MED ORDER — ONDANSETRON HCL 4 MG/2ML IJ SOLN
4.0000 mg | Freq: Once | INTRAMUSCULAR | Status: AC
  Administered 2018-10-31: 4 mg via INTRAVENOUS
  Filled 2018-10-31: qty 2

## 2018-10-31 MED ORDER — DOXYCYCLINE HYCLATE 100 MG PO TABS
100.0000 mg | ORAL_TABLET | Freq: Two times a day (BID) | ORAL | Status: DC
  Administered 2018-10-31 – 2018-11-03 (×6): 100 mg via ORAL
  Filled 2018-10-31 (×6): qty 1

## 2018-10-31 NOTE — ED Notes (Signed)
Attempted report, will CB

## 2018-10-31 NOTE — H&P (Addendum)
History and Physical    Brittany [name] PIR:[id number] DOB: [birth year] DOA: 10/31/2018  PCP: System, Pcp Not In   Patient coming from:  SNF  Chief Complaint: Status post fall with right lower extremity pain  HPI: Brittany [name] is a 84 y.o. female from [facility] skilled nursing facility with medical history significant for vascular dementia, bedbound status, hyperlipidemia, hypertension, hypothyroidism, history of myelodysplastic syndrome and pancytopenia, presented to the hospital after sustaining a fall today.  It was an unwitnessed event.  Patient did have few falls in the past and the recent one on 10/25/2018 at the skilled nursing facility.  At that time, she sustained a laceration to the right temporal scalp without hematoma and the laceration was taken care at the emergency department with dermabond and was discharged back to skilled nursing facility.  Today, she again had a unwitnessed fall and complained of right hip pain.  Patient does have history of significant dementia and is able to recognize her son only but has poor short-term memory.  She is pretty much bedbound recently and uses oxygen at the skilled nursing facility.  She is able to feed by herself but otherwise needs help with activities of daily living.  Patient does not remember the event of fall today.  Patient is DO NOT RESUSCITATE.  She does report right hip pain at the time of my interview.  There is no mention of nausea vomiting recent diarrhea. No mention of cough shortness of breath fever or chills recently.  No mention of changes in her bowel or urinary habits recently.  History is limited because of patient's dementia.  ED Course: In the ED, patient was found to have right distal femur fracture, mild AKI and leukocytosis..  Orthopedics was consulted.  Chest x-ray showed cardiomegaly.  Patient was then considered for admission to the hospital for further evaluation and pain management.  EEG showed a sinus  rhythm.  Review of Systems:  is limited because of patient's dementia   Past Medical History:  Diagnosis Date  . Blood transfusion 2006  . Colon polyps   . CVA (cerebral infarction) 2013  . Dementia ([place])   . Diverticulosis of colon   . DJD (degenerative joint disease)   . Hyperlipidemia   . Hypertension   . Hypothyroidism   . Melanoma ([place]) 03/20/15   removed with Moh's surgery  . Myelodysplasia 02/04/2012  . Normochromic normocytic anemia 02/04/2012  . Onychomycosis   . Osteoporosis   . Rectal prolapse   . Shoulder pain   . Unstable gait     Past Surgical History:  Procedure Laterality Date  . APPENDECTOMY    . CARPAL TUNNEL RELEASE Bilateral 2006  . FLEXIBLE SIGMOIDOSCOPY  07/09/2012   Procedure: FLEXIBLE SIGMOIDOSCOPY;  Surgeon: Inda Castle, MD;  Location: [facility];  Service: Endoscopy;  Laterality: N/A;  . INCISION AND DRAINAGE PERIRECTAL ABSCESS N/A 09/21/2018   Procedure: IRRIGATION AND DEBRIDEMENT PERIRECTAL ABSCESS;  Surgeon: Rolm Bookbinder, MD;  Location: [facility];  Service: General;  Laterality: N/A;  . MELANOMA EXCISION Right 2005   arm  . POLYPECTOMY  2004   Laparoscopic  . RECTOCELE REPAIR    . REPLACEMENT TOTAL KNEE  2005 & 2006  . VAGINAL HYSTERECTOMY  1989     reports that she has never smoked. She has never used smokeless tobacco. She reports that she does not drink alcohol or use drugs.  Allergies  Allergen Reactions  . Lisinopril     REACTION: angioedema  .  Penicillins     REACTION: rash Has patient had a PCN reaction causing immediate rash, facial/tongue/throat swelling, SOB or lightheadedness with hypotension: Unknown Has patient had a PCN reaction causing severe rash involving mucus membranes or skin necrosis: Unknown Has patient had a PCN reaction that required hospitalization: Unknown Has patient had a PCN reaction occurring within the last 10 years: Unknown If all of the above answers are "NO", then may proceed with Cephalosporin  use.    Family History  Problem Relation Age of Onset  . Prostate cancer Brother   . Cancer Brother        renal cell carcinoma  . Heart disease Brother   . Breast cancer Mother 86  . Cancer Father        unknown kind  . Arthritis Daughter      Prior to Admission medications   Medication Sig Start Date End Date Taking? Authorizing Provider  acetaminophen (TYLENOL) 500 MG tablet Take 500 mg by mouth every 8 (eight) hours as needed for mild pain.  05/31/13  Yes Marletta Lor, MD  aspirin EC 81 MG tablet Take 81 mg by mouth daily.   Yes [provider]  cycloSPORINE (RESTASIS) 0.05 % ophthalmic emulsion Place 1 drop into both eyes 2 (two) times daily.     Yes [provider]  donepezil (ARICEPT) 10 MG tablet Take 10 mg by mouth at bedtime.   Yes [provider]  doxycycline (DORYX) 100 MG EC tablet Take 100 mg by mouth 2 (two) times daily. 10/23/18 11/02/18 Yes [provider]  hydroxypropyl methylcellulose / hypromellose (ISOPTO TEARS / GONIOVISC) 2.5 % ophthalmic solution Place 1 drop into both eyes every 2 (two) hours as needed for dry eyes.   Yes [provider]  levothyroxine (SYNTHROID, LEVOTHROID) 88 MCG tablet Take 1 tablet (88 mcg total) by mouth daily before breakfast. 09/25/18  Yes Starla Link, Kshitiz, MD  Metoprolol Tartrate 37.5 MG TABS Take 37.5 mg by mouth 2 (two) times daily.   Yes [provider]  polyethylene glycol powder (GLYCOLAX/MIRALAX) powder Take 17 g by mouth daily as needed for mild constipation.    Yes [provider]  saccharomyces boulardii (FLORASTOR) 250 MG capsule Take 250 mg by mouth 2 (two) times daily.   Yes [provider]  Sodium Fluoride (PREVIDENT 5000 BOOSTER PLUS) 1.1 % PSTE Place 1 application onto teeth daily.    Yes [provider]  traMADol (ULTRAM) 50 MG tablet Take 50 mg by mouth every 8 (eight) hours.   Yes [provider]    Physical Exam: Vitals:    10/31/18 1525 10/31/18 1530 10/31/18 1617 10/31/18 1630  BP:  (!) 147/64 (!) 148/62 (!) 165/65  Pulse: 75 74 74 76  Resp:  16 (!) 22 15  Temp:      TempSrc:      SpO2: 100% 100% 100% 100%  Weight:      Height:        Constitutional: NAD, calm, comfortable Vitals:   10/31/18 1525 10/31/18 1530 10/31/18 1617 10/31/18 1630  BP:  (!) 147/64 (!) 148/62 (!) 165/65  Pulse: 75 74 74 76  Resp:  16 (!) 22 15  Temp:      TempSrc:      SpO2: 100% 100% 100% 100%  Weight:      Height:       General : Thinly built elderly female, not in obvious distress but on nasal cannula. eyes: PERRL, lids and conjunctivae normal,  built elderly female ENMT: Mucous membranes are moist. Posterior pharynx clear of any exudate or lesions. Neck: normal, supple, no masses, no thyromegaly Respiratory: clear to auscultation bilaterally, no wheezing, no crackles. Normal respiratory effort. No accessory muscle use.  Cardiovascular: Regular rate and rhythm, no murmurs / rubs / gallops. No extremity edema. 2+ pedal pulses. No carotid bruits.  Abdomen: no tenderness, no masses palpated. No hepatosplenomegaly. Bowel sounds positive.  Musculoskeletal: no clubbing / cyanosis. No joint deformity upper and lower extremities. Good ROM, no contractures. Normal muscle tone.  Right upper extremity bruising and swelling over the right shoulder.  Bruising over the right elbow with around 2 cm laceration.  Right hip is shortened and rotated. Skin: Right buttocks stage III-IV ulceration present on admission with tunneling and surrounding erythema.  Erythema of the left hip as well. Neurologic: Alert awake and communicative.  Unable to move right lower extremity due to fracture. Psychiatric: Alert awake and communicative.  Foley Catheter: External urinary catheter  Labs on Admission: I have personally reviewed following labs and imaging studies  CBC: Recent Labs  Lab 10/31/18 1524  WBC 19.6*  NEUTROABS 14.6*  HGB 9.5*  HCT  30.3*  MCV 88.6  PLT 606*   Basic Metabolic Panel: Recent Labs  Lab 10/31/18 1524  NA 141  K 5.0  CL 109  CO2 18*  GLUCOSE 146*  BUN 46*  CREATININE 1.54*  CALCIUM 9.4   GFR: Estimated Creatinine Clearance: 18.4 mL/min (A) (by C-G formula based on SCr of 1.54 mg/dL (H)). Liver Function Tests: No results for input(s): AST, ALT, ALKPHOS, BILITOT, PROT, ALBUMIN in the last 168 hours. No results for input(s): LIPASE, AMYLASE in the last 168 hours. No results for input(s): AMMONIA in the last 168 hours. Coagulation Profile: No results for input(s): INR, PROTIME in the last 168 hours. Cardiac Enzymes: No results for input(s): CKTOTAL, CKMB, CKMBINDEX, TROPONINI in the last 168 hours. BNP (last 3 results) No results for input(s): PROBNP in the last 8760 hours. HbA1C: No results for input(s): HGBA1C in the last 72 hours. CBG: No results for input(s): GLUCAP in the last 168 hours. Lipid Profile: No results for input(s): CHOL, HDL, LDLCALC, TRIG, CHOLHDL, LDLDIRECT in the last 72 hours. Thyroid Function Tests: No results for input(s): TSH, T4TOTAL, FREET4, T3FREE, THYROIDAB in the last 72 hours. Anemia Panel: No results for input(s): VITAMINB12, FOLATE, FERRITIN, TIBC, IRON, RETICCTPCT in the last 72 hours. Urine analysis:    Component Value Date/Time   COLORURINE YELLOW 09/18/2018 1346   APPEARANCEUR CLOUDY (A) 09/18/2018 1346   LABSPEC 1.012 09/18/2018 1346   LABSPEC 1.015 02/11/2007 1508   PHURINE 5.0 09/18/2018 1346   GLUCOSEU NEGATIVE 09/18/2018 1346   HGBUR SMALL (A) 09/18/2018 1346   BILIRUBINUR NEGATIVE 09/18/2018 1346   BILIRUBINUR Negative 02/11/2007 1508   KETONESUR NEGATIVE 09/18/2018 1346   PROTEINUR 30 (A) 09/18/2018 1346   UROBILINOGEN 0.2 04/20/2013 1608   NITRITE NEGATIVE 09/18/2018 1346   LEUKOCYTESUR LARGE (A) 09/18/2018 1346   LEUKOCYTESUR Small 02/11/2007 1508    Radiological Exams on Admission: Dg Elbow Complete Right  Result Date:  10/31/2018 CLINICAL DATA:  Lateral right elbow pain and abrasions following a fall today. EXAM: RIGHT ELBOW - COMPLETE 3+ VIEW COMPARISON:  None. FINDINGS: There is no evidence of fracture, dislocation, or joint effusion. There is no evidence of arthropathy or other focal bone abnormality. Soft tissues are unremarkable. IMPRESSION: No fracture or dislocation. Electronically Signed   By: Percell Locus.D.  On: 10/31/2018 15:20   Dg Chest Port 1 View  Result Date: 10/31/2018 CLINICAL DATA:  Fall, hypertension EXAM: PORTABLE CHEST 1 VIEW COMPARISON:  09/16/2018 FINDINGS: Cardiomegaly. Lungs clear. No effusions. No overt edema. No acute bony abnormality. Scoliosis and degenerative changes in the thoracic spine. IMPRESSION: Cardiomegaly.  No active disease. Electronically Signed   By: Rolm Baptise M.D.   On: 10/31/2018 16:49   Dg Shoulder Right Portable  Result Date: 10/31/2018 CLINICAL DATA:  Fall, right shoulder pain EXAM: PORTABLE RIGHT SHOULDER COMPARISON:  None. FINDINGS: No fracture or dislocation is seen. Moderate to severe degenerative changes with chronic rotator cuff tear. Visualized soft tissues are within normal limits. Visualized right lung is clear. IMPRESSION: No fracture or dislocation is seen. Moderate to severe degenerative changes. Electronically Signed   By: Julian Hy M.D.   On: 10/31/2018 15:21   Dg Hip Unilat W Or Wo Pelvis 2-3 Views Right  Result Date: 10/31/2018 CLINICAL DATA:  Fall, right femur pain EXAM: DG HIP (WITH OR WITHOUT PELVIS) 2-3V RIGHT COMPARISON:  None. FINDINGS: No evidence of fracture dislocation of the right hip. Visualized bony pelvis appears intact. Bilateral hip joint spaces are symmetric. Distal femoral shaft fracture, described on dedicated femur radiographs. IMPRESSION: No evidence of fracture dislocation of the right hip. Distal femoral shaft fracture, described on dedicated femur radiographs. Electronically Signed   By: Julian Hy M.D.   On:  10/31/2018 15:24   Dg Femur Min 2 Views Right  Result Date: 10/31/2018 CLINICAL DATA:  Fall, right femur pain EXAM: RIGHT FEMUR 2 VIEWS COMPARISON:  None. FINDINGS: Displaced, angulated right mid/distal femoral shaft fracture. Apex dorsal angulation is suspected, although exact orientation is difficult due to difficulty with patient positioning. Right knee arthroplasty. IMPRESSION: Displaced, angulated right mid/distal femoral shaft fracture. Electronically Signed   By: Julian Hy M.D.   On: 10/31/2018 15:26    Assessment/Plan Principal Problem:   Femur fracture, right (HCC) Active Problems:   Myelodysplastic syndrome (HCC)   Hypothyroidism   Hyperlipidemia   Essential hypertension   Vascular dementia without behavioral disturbance (HCC)   History of CVA (cerebrovascular accident)   CKD (chronic kidney disease) stage 3, GFR 30-59 ml/min (HCC)   Diastolic CHF ([place])  Right distal femur fracture.  Orthopedics has been consulted from the ED.  Will follow orthopedic recommendation further level of care.  Patient will likely be on a traction at this time.  Is currently n.p.o. Check BNP.  Patient did have a 2D echocardiogram on 09/22/2018 with the LV ejection fraction of 55 to 60%.  Patient is mostly bedbound and if she were to go through surgery ( intermediate procedure) would possess around 10.1% 30-day risk of death MI or cardiac arrest.  We will continue gentle IV fluid hydration.  Dr. Wynelle Link orthopedics was notified by the ED Provider.  History of myelodysplastic syndrome.  Closely monitor CBC.  Hemoglobin of 9.5 and platelets of 566 at this time.  Acute kidney injury secondary to volume depletion.  BUN is also elevated.  Last creatinine on 09/24/2018 was 0.8.  Was will continue on IV fluid hydration for now.  Closely monitor for fluid overload.  Patient does have history of congestive heart failure in the past.  Currently compensated.  hypothyroidism continue on Synthroid.  Right  buttocks ulceration with tunneling and erythema.  Mild leukocytosis.  Patient goes to wound care center as outpatient.  Currently on doxycycline.  We will continue with that.  History of hypertension CVA on metoprolol.  We will continue with that.  On low-dose aspirin we will continue with that.  History of vascular dementia, bedbound status.  Continue supportive care.  DVT prophylaxis: SCD  Code Status: DNR/DNI  Family Communication: Spoke with the patient's family member at bedside regarding the plan for admission and further work-up.  Consults called: Orthopedics from the ER.  Severity of Illness:  I certify that at the point of admission it is my clinical judgment that the patient will require inpatient hospital care spanning beyond 2 midnights from the point of admission due to high intensity of service, high risk for further deterioration and high frequency of surveillance required.    Flora Lipps MD [facility] Pager [phone]  If 7PM-7AM, please contact night-coverage [email or web address] Password [email or web address]  10/31/2018, 5:07 PM

## 2018-10-31 NOTE — Progress Notes (Signed)
Pt has a stage 3 or 4 to sacrum, approx 3 cm open with tunneling. Unable to assess fully due to pt's discomfort when turned. Alevyn dsg applied to site. Melvena Vink, [employer]

## 2018-10-31 NOTE — ED Notes (Signed)
Bed: LA45 Expected date:  Expected time:  Means of arrival:  Comments: Fall hip deformity

## 2018-10-31 NOTE — ED Provider Notes (Signed)
[facility] DEPT Provider Note   CSN: [id number] Arrival date & time: 10/31/18  1351     History   Chief Complaint Chief Complaint  Patient presents with  . Hip Pain  . Fall    HPI Brittany [name] is a 84 y.o. female who presents with an unwitnessed fall and R hip pain. PMH significant for dementia. She resides at [facility]. She was found in the bathroom on the ground. Last seen by staff at breakfast. The patient cannot recall how she fell. She does report right hip pain. She has no other complaints. She is DNR.  LEVEL 5 caveat due to dementia.  HPI  Past Medical History:  Diagnosis Date  . Blood transfusion 2006  . Colon polyps   . CVA (cerebral infarction) 2013  . Dementia ([place])   . Diverticulosis of colon   . DJD (degenerative joint disease)   . Hyperlipidemia   . Hypertension   . Hypothyroidism   . Melanoma ([place]) 03/20/15   removed with Moh's surgery  . Myelodysplasia 02/04/2012  . Normochromic normocytic anemia 02/04/2012  . Onychomycosis   . Osteoporosis   . Rectal prolapse   . Shoulder pain   . Unstable gait     Patient Active Problem List   Diagnosis Date Noted  . Pancytopenia ([place]) 09/24/2018  . Palliative care encounter   . Perirectal inflammation 09/20/2018  . Weight loss 07/28/2018  . Actinic keratosis 06/26/2018  . Diastolic CHF ([place]) 09/98/3382  . History of CVA (cerebrovascular accident) 08/08/2017  . CKD (chronic kidney disease) stage 3, GFR 30-59 ml/min (HCC) 08/08/2017  . Anemia, iron deficiency 01/11/2016  . Unstable gait   . Shoulder pain   . History of melanoma 03/20/2015  . Constipation 07/18/2014  . Vascular dementia without behavioral disturbance ([place]) 11/11/2012  . Stroke ([place]) 12/17/2011  . Back pain 04/23/2011  . COLONIC POLYPS, HX OF 06/05/2009  . Myelodysplastic syndrome ([place]) 01/02/2009  . Hypothyroidism 08/21/2007  . Hyperlipidemia 08/21/2007  . Essential hypertension 08/21/2007  .  DIVERTICULOSIS, COLON 08/21/2007  . OSTEOPOROSIS 08/21/2007  . SKIN CANCER, HX OF 08/21/2007    Past Surgical History:  Procedure Laterality Date  . APPENDECTOMY    . CARPAL TUNNEL RELEASE Bilateral 2006  . FLEXIBLE SIGMOIDOSCOPY  07/09/2012   Procedure: FLEXIBLE SIGMOIDOSCOPY;  Surgeon: Inda Castle, MD;  Location: [facility];  Service: Endoscopy;  Laterality: N/A;  . INCISION AND DRAINAGE PERIRECTAL ABSCESS N/A 09/21/2018   Procedure: IRRIGATION AND DEBRIDEMENT PERIRECTAL ABSCESS;  Surgeon: Rolm Bookbinder, MD;  Location: [facility];  Service: General;  Laterality: N/A;  . MELANOMA EXCISION Right 2005   arm  . POLYPECTOMY  2004   Laparoscopic  . RECTOCELE REPAIR    . REPLACEMENT TOTAL KNEE  2005 & 2006  . VAGINAL HYSTERECTOMY  1989     OB History    Gravida  4   Para  4   Term  4   Preterm      AB      Living  4     SAB      TAB      Ectopic      Multiple      Live Births               Home Medications    Prior to Admission medications   Medication Sig Start Date End Date Taking? Authorizing Provider  acetaminophen (TYLENOL) 500 MG tablet Take 500 mg by mouth every 8 (  eight) hours as needed for mild pain.  05/31/13   Marletta Lor, MD  aspirin EC 81 MG tablet Take 81 mg by mouth daily.    [provider]  cycloSPORINE (RESTASIS) 0.05 % ophthalmic emulsion Place 1 drop into both eyes 2 (two) times daily.      [provider]  donepezil (ARICEPT) 10 MG tablet Take 10 mg by mouth at bedtime.    [provider]  doxycycline (DORYX) 100 MG EC tablet Take 100 mg by mouth 2 (two) times daily. 10/23/18 11/02/18  [provider]  hydroxypropyl methylcellulose / hypromellose (ISOPTO TEARS / GONIOVISC) 2.5 % ophthalmic solution Place 1 drop into both eyes every 2 (two) hours as needed for dry eyes.    [provider]  levothyroxine (SYNTHROID, LEVOTHROID) 88 MCG tablet Take 1 tablet (88 mcg total) by mouth daily  before breakfast. 09/25/18   Aline August, MD  Metoprolol Tartrate 37.5 MG TABS Take 37.5 mg by mouth 2 (two) times daily.    [provider]  polyethylene glycol powder (GLYCOLAX/MIRALAX) powder Take 17 g by mouth daily as needed for mild constipation.     [provider]  saccharomyces boulardii (FLORASTOR) 250 MG capsule Take 250 mg by mouth 2 (two) times daily.    [provider]  Sodium Fluoride (PREVIDENT 5000 BOOSTER PLUS) 1.1 % PSTE Place 1 application onto teeth daily.     [provider]  traMADol (ULTRAM) 50 MG tablet Take 50 mg by mouth every 8 (eight) hours.    [provider]    Family History Family History  Problem Relation Age of Onset  . Prostate cancer Brother   . Cancer Brother        renal cell carcinoma  . Heart disease Brother   . Breast cancer Mother 40  . Cancer Father        unknown kind  . Arthritis Daughter     Social History Social History   Tobacco Use  . Smoking status: Never Smoker  . Smokeless tobacco: Never Used  Substance Use Topics  . Alcohol use: No  . Drug use: No     Allergies   Lisinopril and Penicillins   Review of Systems Review of Systems  Unable to perform ROS: Dementia  Musculoskeletal: Positive for arthralgias.     Physical Exam Updated Vital Signs BP (!) 151/61 (BP Location: Left Arm)   Pulse 64   Temp 98.3 F (36.8 C) (Oral)   Resp 18   Ht 4\' 11"  (1.499 m)   Wt 60 kg   SpO2 100%   BMI 26.72 kg/m   Physical Exam  Constitutional: She is oriented to person, place, and time. She appears well-developed and well-nourished. No distress.  Calm and cooperative. In pain when she is moved in the bed. Nasal cannula in place  HENT:  Head: Normocephalic and atraumatic.  Eyes: Pupils are equal, round, and reactive to light. Conjunctivae are normal. Right eye exhibits no discharge. Left eye exhibits no discharge. No scleral icterus.  Neck: Normal range of motion.    Cardiovascular: Normal rate.  Pulmonary/Chest: Effort normal. No respiratory distress.  Abdominal: She exhibits no distension.  Genitourinary:  Genitourinary Comments: Erythema of the buttocks  Musculoskeletal:  Right upper extremity: Bruising and swelling over the right shoulder. Some bruising over the elbow as well with associated ~2cm laceration. Mild tenderness elicited. 2+ radial pulse  Right hip: Hip is shortened and rotated. 1+DP pulse  Neurological: She is  alert and oriented to person, place, and time.  Skin: Skin is warm and dry.  Psychiatric: She has a normal mood and affect. Her behavior is normal.  Nursing note and vitals reviewed.    ED Treatments / Results  Labs (all labs ordered are listed, but only abnormal results are displayed) Labs Reviewed  BASIC METABOLIC PANEL - Abnormal; Notable for the following components:      Result Value   CO2 18 (*)    Glucose, Bld 146 (*)    BUN 46 (*)    Creatinine, Ser 1.54 (*)    GFR calc non Af Amer 28 (*)    GFR calc Af Amer 33 (*)    All other components within normal limits  CBC WITH DIFFERENTIAL/PLATELET - Abnormal; Notable for the following components:   WBC 19.6 (*)    RBC 3.42 (*)    Hemoglobin 9.5 (*)    HCT 30.3 (*)    RDW 18.3 (*)    Platelets 566 (*)    nRBC 0.6 (*)    Neutro Abs 14.6 (*)    Monocytes Absolute 2.1 (*)    Abs Immature Granulocytes 0.42 (*)    All other components within normal limits  BRAIN NATRIURETIC PEPTIDE  URINALYSIS, ROUTINE W REFLEX MICROSCOPIC  BASIC METABOLIC PANEL  CBC    EKG None  Radiology Dg Elbow Complete Right  Result Date: 10/31/2018 CLINICAL DATA:  Lateral right elbow pain and abrasions following a fall today. EXAM: RIGHT ELBOW - COMPLETE 3+ VIEW COMPARISON:  None. FINDINGS: There is no evidence of fracture, dislocation, or joint effusion. There is no evidence of arthropathy or other focal bone abnormality. Soft tissues are unremarkable. IMPRESSION: No fracture or  dislocation. Electronically Signed   By: Claudie Revering M.D.   On: 10/31/2018 15:20   Dg Chest Port 1 View  Result Date: 10/31/2018 CLINICAL DATA:  Fall, hypertension EXAM: PORTABLE CHEST 1 VIEW COMPARISON:  09/16/2018 FINDINGS: Cardiomegaly. Lungs clear. No effusions. No overt edema. No acute bony abnormality. Scoliosis and degenerative changes in the thoracic spine. IMPRESSION: Cardiomegaly.  No active disease. Electronically Signed   By: Rolm Baptise M.D.   On: 10/31/2018 16:49   Dg Shoulder Right Portable  Result Date: 10/31/2018 CLINICAL DATA:  Fall, right shoulder pain EXAM: PORTABLE RIGHT SHOULDER COMPARISON:  None. FINDINGS: No fracture or dislocation is seen. Moderate to severe degenerative changes with chronic rotator cuff tear. Visualized soft tissues are within normal limits. Visualized right lung is clear. IMPRESSION: No fracture or dislocation is seen. Moderate to severe degenerative changes. Electronically Signed   By: Julian Hy M.D.   On: 10/31/2018 15:21   Dg Hip Unilat W Or Wo Pelvis 2-3 Views Right  Result Date: 10/31/2018 CLINICAL DATA:  Fall, right femur pain EXAM: DG HIP (WITH OR WITHOUT PELVIS) 2-3V RIGHT COMPARISON:  None. FINDINGS: No evidence of fracture dislocation of the right hip. Visualized bony pelvis appears intact. Bilateral hip joint spaces are symmetric. Distal femoral shaft fracture, described on dedicated femur radiographs. IMPRESSION: No evidence of fracture dislocation of the right hip. Distal femoral shaft fracture, described on dedicated femur radiographs. Electronically Signed   By: Julian Hy M.D.   On: 10/31/2018 15:24   Dg Femur Min 2 Views Right  Result Date: 10/31/2018 CLINICAL DATA:  Fall, right femur pain EXAM: RIGHT FEMUR 2 VIEWS COMPARISON:  None. FINDINGS: Displaced, angulated right mid/distal femoral shaft fracture. Apex dorsal angulation is suspected, although exact orientation is difficult due to difficulty  with patient  positioning. Right knee arthroplasty. IMPRESSION: Displaced, angulated right mid/distal femoral shaft fracture. Electronically Signed   By: Julian Hy M.D.   On: 10/31/2018 15:26    Procedures Procedures (including critical care time)  Medications Ordered in ED Medications  LORazepam (ATIVAN) injection 0.5 mg (0.5 mg Intravenous Given 10/31/18 1541)  ondansetron (ZOFRAN) injection 4 mg (4 mg Intravenous Given 10/31/18 1542)  morphine 4 MG/ML injection 4 mg (4 mg Intravenous Given 10/31/18 1541)  sodium chloride 0.9 % bolus 1,000 mL ( Intravenous Stopped 10/31/18 1721)     Initial Impression / Assessment and Plan / ED Course  I have reviewed the triage vital signs and the nursing notes.  Pertinent labs & imaging results that were available during my care of the patient were reviewed by me and considered in my medical decision making (see chart for details).  84 year old female with unwitnessed mechanical fall at her nursing home. She is hypertensive but otherwise vitals are normal. The patient is demented but able to talk with me and reports that her right hip hurts. Her hip appears rotated on exam. She has a normal pulse. She also has a small skin tear of her elbow and her shoulder appears swollen but is not very tender.   Xray of shoulder, elbow, and hip are negative however right femur shows a mid-distal displaced femur fracture. The patient's son is now at bedside. Discussed with him. They would like to talk to orthopedics to see what their options are since the patient is 70 and DNR. Shared visit with Dr. Wilson Singer  Notably her CBC shows a leukocytosis of 19.6 with anemia which around baseline. Do not see any obvious source of infection and is most likely from her fracture. Will add UA and CXR. BMP shows AKI (SCr is 1.54 up from .8 one month ago). Discussed with Dr. Maureen Ralphs with ortho who will see the patient but asks medicine to admit. Spoke with Dr. Louanne Belton who will admit.  Final  Clinical Impressions(s) / ED Diagnoses   Final diagnoses:  Closed fracture of distal end of right femur, unspecified fracture morphology, initial encounter ([place])  Leukocytosis, unspecified type  Fall, initial encounter    ED Discharge Orders    None       Iris Pert 10/31/18 2028    Virgel Manifold, MD 11/06/18 [phone]

## 2018-10-31 NOTE — ED Notes (Addendum)
ED TO INPATIENT HANDOFF REPORT  Name/Age/Gender Brittany [name] 84 y.o. female  Code Status Code Status History    Date Active Date Inactive Code Status Order ID Comments User Context   09/16/2018 0905 09/25/2018 2219 DNR [id number]  Karmen Bongo, MD ED   08/15/2018 0050 08/19/2018 0608 DNR [id number]  Mercy Riding, MD Inpatient   08/14/2018 2007 08/15/2018 0050 DNR [id number]  Merrily Pew, MD ED   01/04/2016 1437 08/14/2018 1936 DNR [id number]  Ripley Fraise, CMA Outpatient    Questions for Most Recent Historical Code Status (Order [id number])    Question Answer Comment   In the event of cardiac or respiratory ARREST Do not call a "code blue"    In the event of cardiac or respiratory ARREST Do not perform Intubation, CPR, defibrillation or ACLS    In the event of cardiac or respiratory ARREST Use medication by any route, position, wound care, and other measures to relive pain and suffering. May use oxygen, suction and manual treatment of airway obstruction as needed for comfort.         Advance Directive Documentation     Most Recent Value  Type of Advance Directive  Healthcare Power of Attorney, Living will, Out of facility DNR (pink MOST or yellow form)  Pre-existing out of facility DNR order (yellow form or pink MOST form)  -  "MOST" Form in Place?  -      Home/SNF/Other Nursing Home  Chief Complaint fall; hip pain  Level of Care/Admitting Diagnosis ED Disposition    ED Disposition Condition Comment   Admit  The patient appears reasonably stabilized for admission considering the current resources, flow, and capabilities available in the ED at this time, and I doubt any other [facility] requiring further screening and/or treatment in the ED prior to admission is  present.       Medical History Past Medical History:  Diagnosis Date  . Blood transfusion 2006  . Colon polyps   . CVA (cerebral infarction) 2013  . Dementia ([place])   . Diverticulosis of colon   . DJD (degenerative  joint disease)   . Hyperlipidemia   . Hypertension   . Hypothyroidism   . Melanoma ([place]) 03/20/15   removed with Moh's surgery  . Myelodysplasia 02/04/2012  . Normochromic normocytic anemia 02/04/2012  . Onychomycosis   . Osteoporosis   . Rectal prolapse   . Shoulder pain   . Unstable gait     Allergies Allergies  Allergen Reactions  . Lisinopril     REACTION: angioedema  . Penicillins     REACTION: rash Has patient had a PCN reaction causing immediate rash, facial/tongue/throat swelling, SOB or lightheadedness with hypotension: Unknown Has patient had a PCN reaction causing severe rash involving mucus membranes or skin necrosis: Unknown Has patient had a PCN reaction that required hospitalization: Unknown Has patient had a PCN reaction occurring within the last 10 years: Unknown If all of the above answers are "NO", then may proceed with Cephalosporin use.    IV Location/Drains/Wounds Patient Lines/Drains/Airways Status   Active Line/Drains/Airways    Name:   Placement date:   Placement time:   Site:   Days:   Peripheral IV 10/31/18 Left Antecubital   10/31/18    1526    Antecubital   less than 1   Incision (Closed) 09/21/18 Buttocks Other (Comment)   09/21/18    1431     40   Wound / Incision (Open or Dehisced) 09/25/18 Incision - Open  Buttocks Right s/p surgical I&D   09/25/18    0949    Buttocks   36          Labs/Imaging Results for orders placed or performed during the hospital encounter of 10/31/18 (from the past 48 hour(s))  Basic metabolic panel     Status: Abnormal   Collection Time: 10/31/18  3:24 PM  Result Value Ref Range   Sodium 141 135 - 145 mmol/L   Potassium 5.0 3.5 - 5.1 mmol/L   Chloride 109 98 - 111 mmol/L   CO2 18 (L) 22 - 32 mmol/L   Glucose, Bld 146 (H) 70 - 99 mg/dL   BUN 46 (H) 8 - 23 mg/dL   Creatinine, Ser 1.54 (H) 0.44 - 1.00 mg/dL   Calcium 9.4 8.9 - 10.3 mg/dL   GFR calc non Af Amer 28 (L) >60 mL/min   GFR calc Af Amer 33 (L) >60  mL/min    Comment: (NOTE) The eGFR has been calculated using the CKD EPI equation. This calculation has not been validated in all clinical situations. eGFR's persistently <60 mL/min signify possible Chronic Kidney Disease.    Anion gap 14 5 - 15    Comment: Performed at [facility], [place] [address] [place], [place] [address]  CBC with Differential     Status: Abnormal   Collection Time: 10/31/18  3:24 PM  Result Value Ref Range   WBC 19.6 (H) 4.0 - 10.5 K/uL   RBC 3.42 (L) 3.87 - 5.11 MIL/uL   Hemoglobin 9.5 (L) 12.0 - 15.0 g/dL   HCT 30.3 (L) 36.0 - 46.0 %   MCV 88.6 80.0 - 100.0 fL   MCH 27.8 26.0 - 34.0 pg   MCHC 31.4 30.0 - 36.0 g/dL   RDW 18.3 (H) 11.5 - 15.5 %   Platelets 566 (H) 150 - 400 K/uL   nRBC 0.6 (H) 0.0 - 0.2 %   Neutrophils Relative % 74 %   Neutro Abs 14.6 (H) 1.7 - 7.7 K/uL   Lymphocytes Relative 11 %   Lymphs Abs 2.2 0.7 - 4.0 K/uL   Monocytes Relative 11 %   Monocytes Absolute 2.1 (H) 0.1 - 1.0 K/uL   Eosinophils Relative 1 %   Eosinophils Absolute 0.2 0.0 - 0.5 K/uL   Basophils Relative 1 %   Basophils Absolute 0.1 0.0 - 0.1 K/uL   Immature Granulocytes 2 %   Abs Immature Granulocytes 0.42 (H) 0.00 - 0.07 K/uL    Comment: Performed at [facility], [place] [address] [place], [place] [address]   Dg Elbow Complete Right  Result Date: 10/31/2018 CLINICAL DATA:  Lateral right elbow pain and abrasions following a fall today. EXAM: RIGHT ELBOW - COMPLETE 3+ VIEW COMPARISON:  None. FINDINGS: There is no evidence of fracture, dislocation, or joint effusion. There is no evidence of arthropathy or other focal bone abnormality. Soft tissues are unremarkable. IMPRESSION: No fracture or dislocation. Electronically Signed   By: Claudie Revering M.D.   On: 10/31/2018 15:20   Dg Shoulder Right Portable  Result Date: 10/31/2018 CLINICAL DATA:  Fall, right shoulder pain EXAM: PORTABLE RIGHT SHOULDER COMPARISON:  None. FINDINGS: No  fracture or dislocation is seen. Moderate to severe degenerative changes with chronic rotator cuff tear. Visualized soft tissues are within normal limits. Visualized right lung is clear. IMPRESSION: No fracture or dislocation is seen. Moderate to severe degenerative changes. Electronically Signed   By: Julian Hy M.D.   On: 10/31/2018  15:21   Dg Hip Unilat W Or Wo Pelvis 2-3 Views Right  Result Date: 10/31/2018 CLINICAL DATA:  Fall, right femur pain EXAM: DG HIP (WITH OR WITHOUT PELVIS) 2-3V RIGHT COMPARISON:  None. FINDINGS: No evidence of fracture dislocation of the right hip. Visualized bony pelvis appears intact. Bilateral hip joint spaces are symmetric. Distal femoral shaft fracture, described on dedicated femur radiographs. IMPRESSION: No evidence of fracture dislocation of the right hip. Distal femoral shaft fracture, described on dedicated femur radiographs. Electronically Signed   By: Julian Hy M.D.   On: 10/31/2018 15:24   Dg Femur Min 2 Views Right  Result Date: 10/31/2018 CLINICAL DATA:  Fall, right femur pain EXAM: RIGHT FEMUR 2 VIEWS COMPARISON:  None. FINDINGS: Displaced, angulated right mid/distal femoral shaft fracture. Apex dorsal angulation is suspected, although exact orientation is difficult due to difficulty with patient positioning. Right knee arthroplasty. IMPRESSION: Displaced, angulated right mid/distal femoral shaft fracture. Electronically Signed   By: Julian Hy M.D.   On: 10/31/2018 15:26    Pending Labs Unresulted Labs (From admission, onward)    Start     Ordered   10/31/18 1621  Urinalysis, Routine w reflex microscopic  Once,   R     10/31/18 1621          Vitals/Pain Today's Vitals   10/31/18 1524 10/31/18 1525 10/31/18 1530 10/31/18 1617  BP:   (!) 147/64 (!) 148/62  Pulse: 75 75 74 74  Resp:   16 (!) 22  Temp:      TempSrc:      SpO2: 100% 100% 100% 100%  Weight:      Height:        Isolation Precautions No active  isolations  Medications Medications  sodium chloride 0.9 % bolus 1,000 mL (1,000 mLs Intravenous New Bag/Given 10/31/18 1614)  LORazepam (ATIVAN) injection 0.5 mg (0.5 mg Intravenous Given 10/31/18 1541)  ondansetron (ZOFRAN) injection 4 mg (4 mg Intravenous Given 10/31/18 1542)  morphine 4 MG/ML injection 4 mg (4 mg Intravenous Given 10/31/18 1541)    Mobility Pt was walking with device at facility, non ambulatory now

## 2018-10-31 NOTE — Consult Note (Signed)
Reason for Consult:Right periprosthetic femur fracture Referring Physician: Dr. Stasia Cavalier Brittany [name] is an 84 y.o. female.  HPI: Brittany [name] is a 84 yo female resident of [facility] skilled unit who was found in the bathroom on the floor earlier today after a fall. Had obvious deformity right leg and was taken to [facility] ED for evaluation. Has severe dementia and is not ambulatory but she has gotten out of bed and fallen a few times recently. She can not communicate anything about the fall. She has been admitted by the hospitalist team and we are consulted for management.   Past Medical History:  Diagnosis Date  . Blood transfusion 2006  . Colon polyps   . CVA (cerebral infarction) 2013  . Dementia ([place])   . Diverticulosis of colon   . DJD (degenerative joint disease)   . Hyperlipidemia   . Hypertension   . Hypothyroidism   . Melanoma ([place]) 03/20/15   removed with Moh's surgery  . Myelodysplasia 02/04/2012  . Normochromic normocytic anemia 02/04/2012  . Onychomycosis   . Osteoporosis   . Rectal prolapse   . Shoulder pain   . Unstable gait     Past Surgical History:  Procedure Laterality Date  . APPENDECTOMY    . CARPAL TUNNEL RELEASE Bilateral 2006  . FLEXIBLE SIGMOIDOSCOPY  07/09/2012   Procedure: FLEXIBLE SIGMOIDOSCOPY;  Surgeon: Inda Castle, MD;  Location: [facility];  Service: Endoscopy;  Laterality: N/A;  . INCISION AND DRAINAGE PERIRECTAL ABSCESS N/A 09/21/2018   Procedure: IRRIGATION AND DEBRIDEMENT PERIRECTAL ABSCESS;  Surgeon: Rolm Bookbinder, MD;  Location: [facility];  Service: General;  Laterality: N/A;  . MELANOMA EXCISION Right 2005   arm  . POLYPECTOMY  2004   Laparoscopic  . RECTOCELE REPAIR    . REPLACEMENT TOTAL KNEE  2005 & 2006  . VAGINAL HYSTERECTOMY  1989    Family History  Problem Relation Age of Onset  . Prostate cancer Brother   . Cancer Brother        renal cell carcinoma  . Heart disease Brother   . Breast cancer Mother 67  .  Cancer Father        unknown kind  . Arthritis Daughter     Social History:  reports that she has never smoked. She has never used smokeless tobacco. She reports that she does not drink alcohol or use drugs.  Allergies:  Allergies  Allergen Reactions  . Lisinopril     REACTION: angioedema  . Penicillins     REACTION: rash Has patient had a PCN reaction causing immediate rash, facial/tongue/throat swelling, SOB or lightheadedness with hypotension: Unknown Has patient had a PCN reaction causing severe rash involving mucus membranes or skin necrosis: Unknown Has patient had a PCN reaction that required hospitalization: Unknown Has patient had a PCN reaction occurring within the last 10 years: Unknown If all of the above answers are "NO", then may proceed with Cephalosporin use.    Medications: I have reviewed the patient's current medications.  Results for orders placed or performed during the hospital encounter of 10/31/18 (from the past 48 hour(s))  Basic metabolic panel     Status: Abnormal   Collection Time: 10/31/18  3:24 PM  Result Value Ref Range   Sodium 141 135 - 145 mmol/L   Potassium 5.0 3.5 - 5.1 mmol/L   Chloride 109 98 - 111 mmol/L   CO2 18 (L) 22 - 32 mmol/L   Glucose, Bld 146 (H) 70 -  99 mg/dL   BUN 46 (H) 8 - 23 mg/dL   Creatinine, Ser 1.54 (H) 0.44 - 1.00 mg/dL   Calcium 9.4 8.9 - 10.3 mg/dL   GFR calc non Af Amer 28 (L) >60 mL/min   GFR calc Af Amer 33 (L) >60 mL/min    Comment: (NOTE) The eGFR has been calculated using the CKD EPI equation. This calculation has not been validated in all clinical situations. eGFR's persistently <60 mL/min signify possible Chronic Kidney Disease.    Anion gap 14 5 - 15    Comment: Performed at [facility], [place] [address], [place], [place] [address]  CBC with Differential     Status: Abnormal   Collection Time: 10/31/18  3:24 PM  Result Value Ref Range   WBC 19.6 (H) 4.0 - 10.5 K/uL   RBC 3.42 (L)  3.87 - 5.11 MIL/uL   Hemoglobin 9.5 (L) 12.0 - 15.0 g/dL   HCT 30.3 (L) 36.0 - 46.0 %   MCV 88.6 80.0 - 100.0 fL   MCH 27.8 26.0 - 34.0 pg   MCHC 31.4 30.0 - 36.0 g/dL   RDW 18.3 (H) 11.5 - 15.5 %   Platelets 566 (H) 150 - 400 K/uL   nRBC 0.6 (H) 0.0 - 0.2 %   Neutrophils Relative % 74 %   Neutro Abs 14.6 (H) 1.7 - 7.7 K/uL   Lymphocytes Relative 11 %   Lymphs Abs 2.2 0.7 - 4.0 K/uL   Monocytes Relative 11 %   Monocytes Absolute 2.1 (H) 0.1 - 1.0 K/uL   Eosinophils Relative 1 %   Eosinophils Absolute 0.2 0.0 - 0.5 K/uL   Basophils Relative 1 %   Basophils Absolute 0.1 0.0 - 0.1 K/uL   Immature Granulocytes 2 %   Abs Immature Granulocytes 0.42 (H) 0.00 - 0.07 K/uL    Comment: Performed at [facility], [place] [address], [place], [place] [address]  Brain natriuretic peptide     Status: None   Collection Time: 10/31/18  5:38 PM  Result Value Ref Range   B Natriuretic Peptide 64.7 0.0 - 100.0 pg/mL    Comment: Performed at [facility], [place] [address], [place], [place] [address]    Dg Elbow Complete Right  Result Date: 10/31/2018 CLINICAL DATA:  Lateral right elbow pain and abrasions following a fall today. EXAM: RIGHT ELBOW - COMPLETE 3+ VIEW COMPARISON:  None. FINDINGS: There is no evidence of fracture, dislocation, or joint effusion. There is no evidence of arthropathy or other focal bone abnormality. Soft tissues are unremarkable. IMPRESSION: No fracture or dislocation. Electronically Signed   By: Claudie Revering M.D.   On: 10/31/2018 15:20   Dg Chest Port 1 View  Result Date: 10/31/2018 CLINICAL DATA:  Fall, hypertension EXAM: PORTABLE CHEST 1 VIEW COMPARISON:  09/16/2018 FINDINGS: Cardiomegaly. Lungs clear. No effusions. No overt edema. No acute bony abnormality. Scoliosis and degenerative changes in the thoracic spine. IMPRESSION: Cardiomegaly.  No active disease. Electronically Signed   By: Rolm Baptise M.D.   On: 10/31/2018 16:49   Dg  Shoulder Right Portable  Result Date: 10/31/2018 CLINICAL DATA:  Fall, right shoulder pain EXAM: PORTABLE RIGHT SHOULDER COMPARISON:  None. FINDINGS: No fracture or dislocation is seen. Moderate to severe degenerative changes with chronic rotator cuff tear. Visualized soft tissues are within normal limits. Visualized right lung is clear. IMPRESSION: No fracture or dislocation is seen. Moderate to severe degenerative changes. Electronically Signed   By: Bertis Ruddy  [place] Pink M.D.   On: 10/31/2018 15:21   Dg Hip Unilat W Or Wo Pelvis 2-3 Views Right  Result Date: 10/31/2018 CLINICAL DATA:  Fall, right femur pain EXAM: DG HIP (WITH OR WITHOUT PELVIS) 2-3V RIGHT COMPARISON:  None. FINDINGS: No evidence of fracture dislocation of the right hip. Visualized bony pelvis appears intact. Bilateral hip joint spaces are symmetric. Distal femoral shaft fracture, described on dedicated femur radiographs. IMPRESSION: No evidence of fracture dislocation of the right hip. Distal femoral shaft fracture, described on dedicated femur radiographs. Electronically Signed   By: Julian Hy M.D.   On: 10/31/2018 15:24   Dg Femur Min 2 Views Right  Result Date: 10/31/2018 CLINICAL DATA:  Fall, right femur pain EXAM: RIGHT FEMUR 2 VIEWS COMPARISON:  None. FINDINGS: Displaced, angulated right mid/distal femoral shaft fracture. Apex dorsal angulation is suspected, although exact orientation is difficult due to difficulty with patient positioning. Right knee arthroplasty. IMPRESSION: Displaced, angulated right mid/distal femoral shaft fracture. Electronically Signed   By: Julian Hy M.D.   On: 10/31/2018 15:26    ROS Blood pressure (!) 156/60, pulse 71, temperature 98.3 F (36.8 C), temperature source Oral, resp. rate 14, height '4\' 11"'$  (1.499 m), weight 60 kg, SpO2 100 %. Physical Exam Elderly ill appearing female in bed in NAD but in pain secondary to the fracture Right thigh is rotated, knee is flexed and foot is  near buttocks.  Her foot is warm and pink and she is wiggling her toes There are no other obvious injuries to the leg  X-ray- Oblique displaced periprosthetic supracondylar femur fracture above a total knee arthroplasty  Assessment/Plan: Right periprosthetic distal femur fracture- She has a complex injury with multiple medical comorbidities. I had a discussion with her son and daughter in law about treatment options including closed non-operative treatment with a knee immobilizer versus open reduction and internal fixation operatively. We discussed the pros and cons of each and given her overall cognitive impairment and medical condition we decided upon closed non-op treatment and if it is failing we could always convert to an operative procedure. I gently straightened the leg and applied traction while Pleas Patricia our orthopaedic tech applied a bulky soft dressing and knee immobilizer. She was much more comfortable after the reduction and stabilization.   [name] Shravan Salahuddin 10/31/2018, 8:48 PM

## 2018-10-31 NOTE — ED Triage Notes (Signed)
Pt BIBA from [facility] . Pt found down by staff for unknown amount of time in bathroom by staff. Pt c/o right hip pain.

## 2018-10-31 NOTE — ED Notes (Signed)
Purwick was placed on the pt. Aware of urine specimen. Nurse aware.

## 2018-11-01 ENCOUNTER — Other Ambulatory Visit: Payer: Self-pay

## 2018-11-01 DIAGNOSIS — I5032 Chronic diastolic (congestive) heart failure: Secondary | ICD-10-CM

## 2018-11-01 LAB — BASIC METABOLIC PANEL
Anion gap: 10 (ref 5–15)
BUN: 39 mg/dL — ABNORMAL HIGH (ref 8–23)
CO2: 20 mmol/L — ABNORMAL LOW (ref 22–32)
Calcium: 8.5 mg/dL — ABNORMAL LOW (ref 8.9–10.3)
Chloride: 115 mmol/L — ABNORMAL HIGH (ref 98–111)
Creatinine, Ser: 1.34 mg/dL — ABNORMAL HIGH (ref 0.44–1.00)
GFR calc Af Amer: 39 mL/min — ABNORMAL LOW (ref >60)
GFR calc non Af Amer: 33 mL/min — ABNORMAL LOW (ref >60)
Glucose, Bld: 104 mg/dL — ABNORMAL HIGH (ref 70–99)
Potassium: 4.4 mmol/L (ref 3.5–5.1)
Sodium: 145 mmol/L (ref 135–145)

## 2018-11-01 LAB — CBC
HCT: 25.5 % — ABNORMAL LOW (ref 36.0–46.0)
Hemoglobin: 7.8 g/dL — ABNORMAL LOW (ref 12.0–15.0)
MCH: 27.6 pg (ref 26.0–34.0)
MCHC: 30.6 g/dL (ref 30.0–36.0)
MCV: 90.1 fL (ref 80.0–100.0)
Platelets: 452 10*3/uL — ABNORMAL HIGH (ref 150–400)
RBC: 2.83 MIL/uL — ABNORMAL LOW (ref 3.87–5.11)
RDW: 18.6 % — ABNORMAL HIGH (ref 11.5–15.5)
WBC: 13.6 10*3/uL — ABNORMAL HIGH (ref 4.0–10.5)
nRBC: 0.4 % — ABNORMAL HIGH (ref 0.0–0.2)

## 2018-11-01 NOTE — Progress Notes (Signed)
   Subjective:    recheck right femur fracture Plan for non operative treatment for the leg Currently resting in no acute distress with knee immobilizer in place Patient has severe dementia and no family present  Objective:   VITALS:   Vitals:   10/31/18 2325 10/31/18 2327  BP: (!) 153/58 (!) 153/58  Pulse: 89 87  Resp:  16  Temp:  98.4 F (36.9 C)  SpO2:  100%    Right lower extremity in knee immobilizer nv intact distally Not taken thru any rom  LABS Recent Labs    10/31/18 1524 11/01/18 0425  HGB 9.5* 7.8*  HCT 30.3* 25.5*  WBC 19.6* 13.6*  PLT 566* 452*    Recent Labs    10/31/18 1524 11/01/18 0425  NA 141 145  K 5.0 4.4  BUN 46* 39*  CREATININE 1.54* 1.34*  GLUCOSE 146* 104*     Assessment/Plan:   right peri prosthetic femur fracture Will plan for supportive non operative treatment for the right leg Strict non weight bearing  Acute blood loss anemia - will defer to medical team for management Will continue to monitor her progress  Pain management as needed   Kathrynn Speed, [place] is now [employer] [facility] [address] [place], [place], [place] [address] Phone: [phone] [email or web address] Facebook  [employer]

## 2018-11-01 NOTE — Progress Notes (Signed)
PROGRESS NOTE  Brittany [name] MNO:[id number] DOB: [birth year] DOA: 10/31/2018 PCP: System, Pcp Not In  HPI/Recap of past 24 hours: HPI from Dr Lovette Brittany [name] is a 84 y.o. female from SNF, with medical history significant for vascular dementia, bedbound status, hyperlipidemia, hypertension, hypothyroidism, history of myelodysplastic syndrome and pancytopenia, presented to the hospital after sustaining an unwitnessed fall of which she doesn't recall. Pt c/o R hip pain. Of note, pt with hx of multiple fall in the past. Last fall prior to this episode was on 10/25/2018 at the SNF after which she sustained a laceration to the right temporal scalp without hematoma and the laceration was taken care at the ED. Patient does have history of significant dementia, bedbound recently and uses oxygen at the SNF. In the ED, patient was found to have right distal femur fracture, mild AKI and leukocytosis. Orthopedics was consulted.  Chest x-ray showed cardiomegaly.  Patient admitted for further management.   Today, met patient sleeping, easily arousable. Denies any worsening hip pain or any other symptoms (limited by dementia). Looks comfortable.  Assessment/Plan: Principal Problem:   Femur fracture, right (HCC) Active Problems:   Myelodysplastic syndrome ([place])   Hypothyroidism   Hyperlipidemia   Essential hypertension   Vascular dementia without behavioral disturbance ([place])   History of CVA (cerebrovascular accident)   CKD (chronic kidney disease) stage 3, GFR 30-59 ml/min (HCC)   Diastolic CHF ([place])  Displaced right mid/distal femur fracture s/p knee immobilizer on 10/31/2018 X-ray right femur showed displaced, angulated right mid/distal femoral shaft fracture Orthopedics on board: Plan is for closed nonoperative treatment for now and if it fails we will convert to an operative procedure Knee immobilizer in place Pain management  Leukocytosis Afebrile, resolving leukocytosis Likely reactive,  due to fracture, unlikely infectious Chest x-ray unremarkable Will order UA, UC Daily CBC  Anemia of chronic disease (CKD, myelodysplastic syndrome) Baseline hemoglobin 8-9, currently 7.8 ?Dilutional No signs of any bleeding Type and screen done Daily CBC  AKI on CKD stage III Resolving, baseline around 1.2-1.3 Gentle IV hydration Avoid nephrotoxic's Daily BMP  Right buttock ulceration with tunneling and erythema present on admission Currently afebrile, with some leukocytosis Continue chronic doxycycline Wound care team consulted  Diastolic HF Appears compensated Echo on 09/22/2018 with EF of 55 to 60%, grade 1 DD  Myelodysplastic syndrome Monitor CBC  Hypertension Continue Lopressor, hydralazine PRN  Hypothyroidism Continue Synthroid  Dementia Continue Aricept    Code Status: DNR  Family Communication: None at bedside  Disposition Plan: Back to SNF, once stable   Consultants:  Orthopedics  Procedures:  Placement of knee immobilizer  Antimicrobials:  P.o. doxycycline  DVT prophylaxis: SCD in anticipation of possible surgery   Objective: Vitals:   10/31/18 1730 10/31/18 1831 10/31/18 2325 10/31/18 2327  BP: (!) 172/55 (!) 156/60 (!) 153/58 (!) 153/58  Pulse: 71 71 89 87  Resp: _0 Temp:  98.3 F (36.8 C)  98.4 F (36.9 C)  TempSrc:  Oral  Axillary  SpO2: 96% 100%  100%  Weight:      Height:        Intake/Output Summary (Last 24 hours) at 11/01/2018 1124 Last data filed at 11/01/2018 1050 Gross per 24 hour  Intake 2113.48 ml  Output 1150 ml  Net 963.48 ml   Filed Weights   10/31/18 1422  Weight: 60 kg    Exam:   General: NAD, appears comfortable, bruising around right eye  Cardiovascular: S1, S2 present  Respiratory:  CTA B  Abdomen: Soft, nontender, nondistended, bowel sounds present  Musculoskeletal: RUE bruising and swelling over right shoulder, bruising over the right elbow with 2 cm laceration.  No pedal  edema bilaterally  Skin: Right buttocks stage III-IV ulceration with tunneling and surrounding erythema  Psychiatry: Unable to assess, sleepy   Data Reviewed: CBC: Recent Labs  Lab 10/31/18 1524 11/01/18 0425  WBC 19.6* 13.6*  NEUTROABS 14.6*  --   HGB 9.5* 7.8*  HCT 30.3* 25.5*  MCV 88.6 90.1  PLT 566* 342*   Basic Metabolic Panel: Recent Labs  Lab 10/31/18 1524 11/01/18 0425  NA 141 145  K 5.0 4.4  CL 109 115*  CO2 18* 20*  GLUCOSE 146* 104*  BUN 46* 39*  CREATININE 1.54* 1.34*  CALCIUM 9.4 8.5*   GFR: Estimated Creatinine Clearance: 21.1 mL/min (A) (by C-G formula based on SCr of 1.34 mg/dL (H)). Liver Function Tests: No results for input(s): AST, ALT, ALKPHOS, BILITOT, PROT, ALBUMIN in the last 168 hours. No results for input(s): LIPASE, AMYLASE in the last 168 hours. No results for input(s): AMMONIA in the last 168 hours. Coagulation Profile: No results for input(s): INR, PROTIME in the last 168 hours. Cardiac Enzymes: No results for input(s): CKTOTAL, CKMB, CKMBINDEX, TROPONINI in the last 168 hours. BNP (last 3 results) No results for input(s): PROBNP in the last 8760 hours. HbA1C: No results for input(s): HGBA1C in the last 72 hours. CBG: No results for input(s): GLUCAP in the last 168 hours. Lipid Profile: No results for input(s): CHOL, HDL, LDLCALC, TRIG, CHOLHDL, LDLDIRECT in the last 72 hours. Thyroid Function Tests: No results for input(s): TSH, T4TOTAL, FREET4, T3FREE, THYROIDAB in the last 72 hours. Anemia Panel: No results for input(s): VITAMINB12, FOLATE, FERRITIN, TIBC, IRON, RETICCTPCT in the last 72 hours. Urine analysis:    Component Value Date/Time   COLORURINE YELLOW 09/18/2018 1346   APPEARANCEUR CLOUDY (A) 09/18/2018 1346   LABSPEC 1.012 09/18/2018 1346   LABSPEC 1.015 02/11/2007 1508   PHURINE 5.0 09/18/2018 1346   GLUCOSEU NEGATIVE 09/18/2018 1346   HGBUR SMALL (A) 09/18/2018 1346   BILIRUBINUR NEGATIVE 09/18/2018 1346    BILIRUBINUR Negative 02/11/2007 1508   KETONESUR NEGATIVE 09/18/2018 1346   PROTEINUR 30 (A) 09/18/2018 1346   UROBILINOGEN 0.2 04/20/2013 1608   NITRITE NEGATIVE 09/18/2018 1346   LEUKOCYTESUR LARGE (A) 09/18/2018 1346   LEUKOCYTESUR Small 02/11/2007 1508   Sepsis Labs: _0 (procalcitonin:4,lacticidven:4)  )No results found for this or any previous visit (from the past 240 hour(s)).    Studies: Dg Elbow Complete Right  Result Date: 10/31/2018 CLINICAL DATA:  Lateral right elbow pain and abrasions following a fall today. EXAM: RIGHT ELBOW - COMPLETE 3+ VIEW COMPARISON:  None. FINDINGS: There is no evidence of fracture, dislocation, or joint effusion. There is no evidence of arthropathy or other focal bone abnormality. Soft tissues are unremarkable. IMPRESSION: No fracture or dislocation. Electronically Signed   By: Claudie Revering M.D.   On: 10/31/2018 15:20   Dg Chest Port 1 View  Result Date: 10/31/2018 CLINICAL DATA:  Fall, hypertension EXAM: PORTABLE CHEST 1 VIEW COMPARISON:  09/16/2018 FINDINGS: Cardiomegaly. Lungs clear. No effusions. No overt edema. No acute bony abnormality. Scoliosis and degenerative changes in the thoracic spine. IMPRESSION: Cardiomegaly.  No active disease. Electronically Signed   By: Rolm Baptise M.D.   On: 10/31/2018 16:49   Dg Shoulder Right Portable  Result Date: 10/31/2018 CLINICAL DATA:  Fall, right shoulder pain EXAM: PORTABLE RIGHT SHOULDER COMPARISON:  None. FINDINGS: No fracture or dislocation is seen. Moderate to severe degenerative changes with chronic rotator cuff tear. Visualized soft tissues are within normal limits. Visualized right lung is clear. IMPRESSION: No fracture or dislocation is seen. Moderate to severe degenerative changes. Electronically Signed   By: Julian Hy M.D.   On: 10/31/2018 15:21   Dg Hip Unilat W Or Wo Pelvis 2-3 Views Right  Result Date: 10/31/2018 CLINICAL DATA:  Fall, right femur pain EXAM: DG HIP (WITH OR  WITHOUT PELVIS) 2-3V RIGHT COMPARISON:  None. FINDINGS: No evidence of fracture dislocation of the right hip. Visualized bony pelvis appears intact. Bilateral hip joint spaces are symmetric. Distal femoral shaft fracture, described on dedicated femur radiographs. IMPRESSION: No evidence of fracture dislocation of the right hip. Distal femoral shaft fracture, described on dedicated femur radiographs. Electronically Signed   By: Julian Hy M.D.   On: 10/31/2018 15:24   Dg Femur Min 2 Views Right  Result Date: 10/31/2018 CLINICAL DATA:  Fall, right femur pain EXAM: RIGHT FEMUR 2 VIEWS COMPARISON:  None. FINDINGS: Displaced, angulated right mid/distal femoral shaft fracture. Apex dorsal angulation is suspected, although exact orientation is difficult due to difficulty with patient positioning. Right knee arthroplasty. IMPRESSION: Displaced, angulated right mid/distal femoral shaft fracture. Electronically Signed   By: Julian Hy M.D.   On: 10/31/2018 15:26   Dg Femur Port, Min 2 Views Right  Result Date: 10/31/2018 CLINICAL DATA:  Status post traction and knee immobilization for known right distal femoral fracture. Initial encounter. EXAM: RIGHT FEMUR PORTABLE 2 VIEW COMPARISON:  None. FINDINGS: Status post traction and immobilization of the right femur, there is resolution of previously noted angulation at the distal femoral fracture, though approximately 9 cm of shortening is noted, with medial and anterior displacement of the distal femur, and mild rotation. The patient's total knee arthroplasty is grossly unremarkable in appearance. No new fractures are seen. The right femoral head remains seated at the acetabulum. IMPRESSION: Status post traction and immobilization of the right femur, there is resolution of previously noted angulation at the distal femoral fracture, though approximately 9 cm of shortening is noted, with medial and anterior displacement of the distal femur, and mild rotation.  Electronically Signed   By: Garald Balding M.D.   On: 10/31/2018 22:04    Scheduled Meds: . aspirin EC  81 mg Oral Daily  . cycloSPORINE  1 drop Both Eyes BID  . donepezil  10 mg Oral QHS  . doxycycline  100 mg Oral Q12H  . levothyroxine  88 mcg Oral QAC breakfast  . metoprolol tartrate  37.5 mg Oral BID  . saccharomyces boulardii  250 mg Oral BID  . Sodium Fluoride  1 application dental Daily    Continuous Infusions: . sodium chloride 75 mL/hr at 11/01/18 0814     LOS: 1 day     Alma Friendly, MD [facility]   If 7PM-7AM, please contact night-coverage [email or web address] 11/01/2018, 11:24 AM

## 2018-11-02 LAB — CBC WITH DIFFERENTIAL/PLATELET
Abs Immature Granulocytes: 0.18 10*3/uL — ABNORMAL HIGH (ref 0.00–0.07)
BASOS ABS: 0 10*3/uL (ref 0.0–0.1)
Basophils Relative: 0 %
EOS ABS: 0.3 10*3/uL (ref 0.0–0.5)
EOS PCT: 2 %
HCT: 23.6 % — ABNORMAL LOW (ref 36.0–46.0)
Hemoglobin: 7.2 g/dL — ABNORMAL LOW (ref 12.0–15.0)
Immature Granulocytes: 1 %
Lymphocytes Relative: 12 %
Lymphs Abs: 1.7 10*3/uL (ref 0.7–4.0)
MCH: 27.7 pg (ref 26.0–34.0)
MCHC: 30.5 g/dL (ref 30.0–36.0)
MCV: 90.8 fL (ref 80.0–100.0)
Monocytes Absolute: 1.6 10*3/uL — ABNORMAL HIGH (ref 0.1–1.0)
Monocytes Relative: 12 %
NRBC: 0.4 % — AB (ref 0.0–0.2)
Neutro Abs: 9.7 10*3/uL — ABNORMAL HIGH (ref 1.7–7.7)
Neutrophils Relative %: 73 %
Platelets: 375 10*3/uL (ref 150–400)
RBC: 2.6 MIL/uL — AB (ref 3.87–5.11)
RDW: 18.6 % — AB (ref 11.5–15.5)
WBC: 13.4 10*3/uL — AB (ref 4.0–10.5)

## 2018-11-02 LAB — BASIC METABOLIC PANEL
Anion gap: 8 (ref 5–15)
BUN: 35 mg/dL — ABNORMAL HIGH (ref 8–23)
CALCIUM: 8.4 mg/dL — AB (ref 8.9–10.3)
CO2: 20 mmol/L — ABNORMAL LOW (ref 22–32)
CREATININE: 1.07 mg/dL — AB (ref 0.44–1.00)
Chloride: 114 mmol/L — ABNORMAL HIGH (ref 98–111)
GFR calc Af Amer: 51 mL/min — ABNORMAL LOW (ref >60)
GFR, EST NON AFRICAN AMERICAN: 44 mL/min — AB (ref >60)
Glucose, Bld: 98 mg/dL (ref 70–99)
Potassium: 4.6 mmol/L (ref 3.5–5.1)
SODIUM: 142 mmol/L (ref 135–145)

## 2018-11-02 LAB — MRSA PCR SCREENING: MRSA BY PCR: POSITIVE — AB

## 2018-11-02 LAB — URINALYSIS, ROUTINE W REFLEX MICROSCOPIC
BILIRUBIN URINE: NEGATIVE
GLUCOSE, UA: NEGATIVE mg/dL
KETONES UR: NEGATIVE mg/dL
NITRITE: POSITIVE — AB
PH: 5 (ref 5.0–8.0)
Protein, ur: NEGATIVE mg/dL
Specific Gravity, Urine: 1.011 (ref 1.005–1.030)

## 2018-11-02 LAB — PREPARE RBC (CROSSMATCH)

## 2018-11-02 MED ORDER — SODIUM CHLORIDE 0.9% IV SOLUTION
Freq: Once | INTRAVENOUS | Status: AC
  Administered 2018-11-02: 11:00:00 via INTRAVENOUS

## 2018-11-02 MED ORDER — FLUCONAZOLE IN SODIUM CHLORIDE 200-0.9 MG/100ML-% IV SOLN: 200.0000 mg | INTRAVENOUS | Status: DC

## 2018-11-02 MED ORDER — SODIUM CHLORIDE 0.9 % IV SOLN
2.0000 g | Freq: Every day | INTRAVENOUS | Status: DC
  Administered 2018-11-02 – 2018-11-03 (×2): 2 g via INTRAVENOUS
  Filled 2018-11-02: qty 20
  Filled 2018-11-02 (×2): qty 2

## 2018-11-02 MED ORDER — FLUCONAZOLE IN SODIUM CHLORIDE 200-0.9 MG/100ML-% IV SOLN
200.0000 mg | Freq: Once | INTRAVENOUS | Status: DC
  Filled 2018-11-02: qty 100

## 2018-11-02 MED ORDER — MUPIROCIN 2 % EX OINT
1.0000 "application " | TOPICAL_OINTMENT | Freq: Two times a day (BID) | CUTANEOUS | Status: DC
  Administered 2018-11-02 – 2018-11-04 (×5): 1 via NASAL
  Filled 2018-11-02 (×2): qty 22

## 2018-11-02 MED ORDER — CHLORHEXIDINE GLUCONATE CLOTH 2 % EX PADS
6.0000 | MEDICATED_PAD | Freq: Every day | CUTANEOUS | Status: DC
  Administered 2018-11-03 – 2018-11-04 (×2): 6 via TOPICAL

## 2018-11-02 MED ORDER — GERHARDT'S BUTT CREAM
TOPICAL_CREAM | Freq: Four times a day (QID) | CUTANEOUS | Status: DC
  Administered 2018-11-02 – 2018-11-03 (×2): via TOPICAL
  Administered 2018-11-03 – 2018-11-04 (×3): 1 via TOPICAL
  Filled 2018-11-02: qty 1

## 2018-11-02 MED ORDER — FLUCONAZOLE 100 MG PO TABS
200.0000 mg | ORAL_TABLET | Freq: Every day | ORAL | Status: DC
  Administered 2018-11-02 – 2018-11-04 (×3): 200 mg via ORAL
  Filled 2018-11-02 (×3): qty 2

## 2018-11-02 NOTE — Consult Note (Signed)
[facility] Nurse wound consult note Reason for Consult: Stage 3 pressure injury to coccygeal area with undermining Wound type: Pressure Pressure Injury POA: Yes Measurement: 4cm x 3.5cm x 1.5cm with 1.2cm undermining at 6 o'clock, 1cm undermining at 9 o'clock Wound bed: pink, nongranulating with roll wound edges Drainage (amount, consistency, odor) moderate amount of light yellow, thin exudate Periwound:periwound erythema (blanching, without warmth or induration, consistent with funal overgrowth). Dressing procedure/placement/frequency: I will provide orders for daily and PRN soiling dressing changes using a silver hydrofiber.  Wound care is complicated with urinary and fecal incontinence, also discomfort when turning due fracture. Patient with dementia. Patient has an appointment this week with outpatient [facility] (Thi[facility]). Recommend keeping that appointment if discharge from hospital, rescheduling if still an inpatient. Recommend a few doses of systemic antifungal (Eg. Diflucan), if you agree, please order.  [facility] nursing team will not follow, but will remain available to this patient, the nursing and medical teams.  Please re-consult if needed. Thanks, Maudie Flakes, MSN, RN, [place], [facility]  Pager# [phone]

## 2018-11-02 NOTE — Discharge Instructions (Addendum)
°  Dr. Gaynelle Arabian Total Joint Specialist [facility] [address], [place], [place] [address] ([phone]) [place] to the affected leg every three hours for 30 minutes at a time and then as needed for pain and swelling.  Continue to use ice on the leg for pain and swelling. You may notice swelling that will progress down to the foot and ankle.  This is normal after surgery.   WEIGHT BEARING Non-weight bearing to the right lower extremity, right leg to stay in knee immobilizer at all times.  MEDICATIONS See your medication summary on the After Visit Summary that the nursing staff will review with you prior to discharge.  You may have some home medications which will be placed on hold until you complete the course of blood thinner medication.  It is important for you to complete the blood thinner medication as prescribed by your surgeon.  Continue your approved medications as instructed at time of discharge.                                                 FOLLOW-UP APPOINTMENTS Make sure you keep all of your appointments after your operation with your surgeon and caregivers. You should call the office at the above phone number and make an appointment for approximately two weeks after the date of your surgery or on the date instructed by your surgeon outlined in the "After Visit Summary".  IF YOU ARE TRANSFERRED TO A SKILLED REHAB FACILITY If the patient is transferred to a skilled rehab facility following release from the hospital, a list of the current medications will be sent to the facility for the patient to continue.  When discharged from the skilled rehab facility, please have the facility set up the patient's [place] prior to being released. Also, the skilled facility will be responsible for providing the patient with their medications at time of release from the facility to include their pain  medication, the muscle relaxants, and their blood thinner medication. If the patient is still at the rehab facility at time of the two week follow up appointment, the skilled rehab facility will also need to assist the patient in arranging follow up appointment in our office and any transportation needs.  MAKE SURE YOU:   Understand these instructions.   Get help right away if you are not doing well or get worse.

## 2018-11-02 NOTE — Progress Notes (Signed)
Subjective: Patient resting comfortably in bed. Easily arousable. No pain complaints   Objective: Vital signs in last 24 hours: Temp:  [97.6 F (36.4 C)-98.3 F (36.8 C)] 97.6 F (36.4 C) (11/18 0533) Pulse Rate:  [60-73] 73 (11/18 0533) Resp:  [14] 14 (11/18 0533) BP: (146-171)/(53-56) 154/56 (11/18 0533) SpO2:  [100 %] 100 % (11/18 0533)  Intake/Output from previous day: 11/17 0701 - 11/18 0700 In: 1839 [I.V.:1839] Out: 1651 [Urine:1651] Intake/Output this shift: No intake/output data recorded.  Recent Labs    10/31/18 1524 11/01/18 0425 11/02/18 0428  HGB 9.5* 7.8* 7.2*   Recent Labs    11/01/18 0425 11/02/18 0428  WBC 13.6* 13.4*  RBC 2.83* 2.60*  HCT 25.5* 23.6*  PLT 452* 375   Recent Labs    11/01/18 0425 11/02/18 0428  NA 145 142  K 4.4 4.6  CL 115* 114*  CO2 20* 20*  BUN 39* 35*  CREATININE 1.34* 1.07*  GLUCOSE 104* 98  CALCIUM 8.5* 8.4*   No results for input(s): LABPT, INR in the last 72 hours.  Brace intact. Pulses intact. Wiggles toes X-rays with improved alignment but still with shortening   Assessment/Plan: Right periprosthetic femur fracture- Will plan on continuing with non-op management. Reduction not anatomic but acceptable. Will follow with serial x-rays post-discharge.   [name] Brittany [name] 11/02/2018, 7:34 AM

## 2018-11-02 NOTE — Progress Notes (Signed)
Pharmacy Antibiotic Note  Brittany [name] is a 84 y.o. female admitted on 10/31/2018 with right lower extremity pain s/p fall. Pharmacy has been consulted for Fluconazole dosing for sacral wound-oral okay per MD.   Plan: Fluconazole 200mg  PO q24h. Monitor renal function, cultures, clinical course.   Height: 4\' 11"  (149.9 cm) Weight: 132 lb 4.4 oz (60 kg) IBW/kg (Calculated) : 43.2  Temp (24hrs), Avg:97.9 F (36.6 C), Min:97.6 F (36.4 C), Max:98.3 F (36.8 C)  Recent Labs  Lab 10/31/18 1524 11/01/18 0425 11/02/18 0428  WBC 19.6* 13.6* 13.4*  CREATININE 1.54* 1.34* 1.07*    Estimated Creatinine Clearance: 26.4 mL/min (A) (by C-G formula based on SCr of 1.07 mg/dL (H)).    Allergies  Allergen Reactions  . Lisinopril     REACTION: angioedema  . Penicillins     REACTION: rash Has patient had a PCN reaction causing immediate rash, facial/tongue/throat swelling, SOB or lightheadedness with hypotension: Unknown Has patient had a PCN reaction causing severe rash involving mucus membranes or skin necrosis: Unknown Has patient had a PCN reaction that required hospitalization: Unknown Has patient had a PCN reaction occurring within the last 10 years: Unknown If all of the above answers are "NO", then may proceed with Cephalosporin use.    Antimicrobials this admission: PTA Doxycycline >> 11/18 Fluconazole >>  11/18 Ceftriaxone >>  Microbiology results: 11/18 UCx: ordered 11/18 MRSA PCR: positive   Thank you for allowing pharmacy to be a part of this patient's care.   Lindell Spar, PharmD, BCPS Pager: [phone] 11/02/2018 6:19 PM

## 2018-11-02 NOTE — Progress Notes (Signed)
PROGRESS NOTE  Brittany [name] GQQ:[id number] DOB: 1924-11-11 DOA: 10/31/2018 PCP: System, Pcp Not In  HPI/Recap of past 24 hours: HPI from Dr Lovette [name] is a 84 y.o. female from SNF, with medical history significant for vascular dementia, bedbound status, hyperlipidemia, hypertension, hypothyroidism, history of myelodysplastic syndrome and pancytopenia, presented to the hospital after sustaining an unwitnessed fall of which she doesn't recall. Pt c/o R hip pain. Of note, pt with hx of multiple fall in the past. Last fall prior to this episode was on 10/25/2018 at the SNF after which she sustained a laceration to the right temporal scalp without hematoma and the laceration was taken care at the ED. Patient does have history of significant dementia, bedbound recently and uses oxygen at the SNF. In the ED, patient was found to have right distal femur fracture, mild AKI and leukocytosis. Orthopedics was consulted.  Chest x-ray showed cardiomegaly.  Patient admitted for further management.   Today, met patient with son at bedside. Looked comfortable, denies any pain.   Assessment/Plan: Principal Problem:   Femur fracture, right (HCC) Active Problems:   Myelodysplastic syndrome ([place])   Hypothyroidism   Hyperlipidemia   Essential hypertension   Vascular dementia without behavioral disturbance ([place])   History of CVA (cerebrovascular accident)   CKD (chronic kidney disease) stage 3, GFR 30-59 ml/min (HCC)   Diastolic CHF ([place])  Displaced right mid/distal femur fracture s/p knee immobilizer on 10/31/2018 X-ray right femur showed displaced, angulated right mid/distal femoral shaft fracture Orthopedics on board: Plan is for closed nonoperative treatment for now and if it fails we will convert to an operative procedure Knee immobilizer in place Pain management  Leukocytosis Afebrile, resolving leukocytosis Likely reactive, due to fracture, ?? Infectious Procalcitonin pending Chest  x-ray unremarkable UA shows large leukocytes, positive nitrites, many bacteria, greater than 50 WBC Urine culture pending collection Start IV aztreonam as patient is allergic to penicillin Daily CBC  Anemia of chronic disease (CKD, myelodysplastic syndrome) Baseline hemoglobin 8-9, currently 7.2, transfuse 2U PRBC today No signs of any bleeding Daily CBC  AKI on CKD stage III Resolved, baseline around 1.2-1.3 Gentle IV hydration Avoid nephrotoxic's Daily BMP  Right buttock ulceration with tunneling and erythema present on admission Currently afebrile, with some leukocytosis Continue chronic doxycycline Wound care team consulted, rec IV Fluconazole   Diastolic HF Appears compensated Echo on 09/22/2018 with EF of 55 to 60%, grade 1 DD  Myelodysplastic syndrome Monitor CBC  Hypertension Continue Lopressor, hydralazine PRN  Hypothyroidism Continue Synthroid  Dementia Continue Aricept    Code Status: DNR  Family Communication: Son at bedside  Disposition Plan: Back to SNF, once stable   Consultants:  Orthopedics  Procedures:  Placement of knee immobilizer  Antimicrobials:  P.o. Doxycycline  ?Aztreonam  ?Fluconazole  DVT prophylaxis: Lovenox [place] from 11/03/18  Objective: Vitals:   11/02/18 0533 11/02/18 1049 11/02/18 1122 11/02/18 1521  BP: (!) 154/56 (!) 157/55 (!) 164/67 139/66  Pulse: 73 73 69 71  Resp: _0 Temp: 97.6 F (36.4 C) 98.3 F (36.8 C) 98.1 F (36.7 C) 97.6 F (36.4 C)  TempSrc:  Oral Oral Oral  SpO2: 100% 100% 100% 100%  Weight:      Height:        Intake/Output Summary (Last 24 hours) at 11/02/2018 1735 Last data filed at 11/02/2018 1520 Gross per 24 hour  Intake 1456.99 ml  Output 501 ml  Net 955.99 ml   Filed Weights   10/31/18 1422  Weight: 60 kg    Exam:  General: NAD, appears comfortable, dressing over bruising around right eye C/D/I  Cardiovascular: S1, S2 present  Respiratory: CTAB  Abdomen:  Soft, nontender, nondistended, bowel sounds present  Musculoskeletal: No bilateral pedal edema noted. RUE bruising and swelling of her right shoulder, bruising over the right elbow 2 cm laceration  Skin:  Right buttock stage III-IV ulceration with tunneling and surrounding erythema  Psychiatry: Appears to be in a normal mood   Data Reviewed: CBC: Recent Labs  Lab 10/31/18 1524 11/01/18 0425 11/02/18 0428  WBC 19.6* 13.6* 13.4*  NEUTROABS 14.6*  --  9.7*  HGB 9.5* 7.8* 7.2*  HCT 30.3* 25.5* 23.6*  MCV 88.6 90.1 90.8  PLT 566* 452* 010   Basic Metabolic Panel: Recent Labs  Lab 10/31/18 1524 11/01/18 0425 11/02/18 0428  NA 141 145 142  K 5.0 4.4 4.6  CL 109 115* 114*  CO2 18* 20* 20*  GLUCOSE 146* 104* 98  BUN 46* 39* 35*  CREATININE 1.54* 1.34* 1.07*  CALCIUM 9.4 8.5* 8.4*   GFR: Estimated Creatinine Clearance: 26.4 mL/min (A) (by C-G formula based on SCr of 1.07 mg/dL (H)). Liver Function Tests: No results for input(s): AST, ALT, ALKPHOS, BILITOT, PROT, ALBUMIN in the last 168 hours. No results for input(s): LIPASE, AMYLASE in the last 168 hours. No results for input(s): AMMONIA in the last 168 hours. Coagulation Profile: No results for input(s): INR, PROTIME in the last 168 hours. Cardiac Enzymes: No results for input(s): CKTOTAL, CKMB, CKMBINDEX, TROPONINI in the last 168 hours. BNP (last 3 results) No results for input(s): PROBNP in the last 8760 hours. HbA1C: No results for input(s): HGBA1C in the last 72 hours. CBG: No results for input(s): GLUCAP in the last 168 hours. Lipid Profile: No results for input(s): CHOL, HDL, LDLCALC, TRIG, CHOLHDL, LDLDIRECT in the last 72 hours. Thyroid Function Tests: No results for input(s): TSH, T4TOTAL, FREET4, T3FREE, THYROIDAB in the last 72 hours. Anemia Panel: No results for input(s): VITAMINB12, FOLATE, FERRITIN, TIBC, IRON, RETICCTPCT in the last 72 hours. Urine analysis:    Component Value Date/Time    COLORURINE YELLOW 11/01/2018 1149   APPEARANCEUR CLOUDY (A) 11/01/2018 1149   LABSPEC 1.011 11/01/2018 1149   LABSPEC 1.015 02/11/2007 1508   PHURINE 5.0 11/01/2018 1149   GLUCOSEU NEGATIVE 11/01/2018 1149   HGBUR SMALL (A) 11/01/2018 1149   BILIRUBINUR NEGATIVE 11/01/2018 1149   BILIRUBINUR Negative 02/11/2007 1508   KETONESUR NEGATIVE 11/01/2018 1149   PROTEINUR NEGATIVE 11/01/2018 1149   UROBILINOGEN 0.2 04/20/2013 1608   NITRITE POSITIVE (A) 11/01/2018 1149   LEUKOCYTESUR LARGE (A) 11/01/2018 1149   LEUKOCYTESUR Small 02/11/2007 1508   Sepsis Labs: _0 (procalcitonin:4,lacticidven:4)  ) Recent Results (from the past 240 hour(s))  MRSA PCR Screening     Status: Abnormal   Collection Time: 11/02/18 11:34 AM  Result Value Ref Range Status   MRSA by PCR POSITIVE (A) NEGATIVE Final    Comment:        The GeneXpert MRSA Assay (FDA approved for NASAL specimens only), is one component of a comprehensive MRSA colonization surveillance program. It is not intended to diagnose MRSA infection nor to guide or monitor treatment for MRSA infections. RESULT CALLED TO, READ BACK BY AND VERIFIED WITH: TAYLOR,J @ 2725 ON [id number] BY POTEAT,S Performed at [facility], [place] [address] [place], [place] [address]       Studies: No results found.  Scheduled Meds: . aspirin EC  81  mg Oral Daily  . [START ON 11/03/2018] Chlorhexidine Gluconate Cloth  6 each Topical Q0600  . cycloSPORINE  1 drop Both Eyes BID  . donepezil  10 mg Oral QHS  . doxycycline  100 mg Oral Q12H  . Gerhardt's butt cream   Topical QID  . levothyroxine  88 mcg Oral QAC breakfast  . metoprolol tartrate  37.5 mg Oral BID  . mupirocin ointment  1 application Nasal BID  . saccharomyces boulardii  250 mg Oral BID  . Sodium Fluoride  1 application dental Daily    Continuous Infusions:    LOS: 2 days     Alma Friendly, MD [facility]   If 7PM-7AM, please contact  night-coverage [email or web address] 11/02/2018, 5:35 PM

## 2018-11-03 LAB — CBC WITH DIFFERENTIAL/PLATELET
ABS IMMATURE GRANULOCYTES: 0.15 10*3/uL — AB (ref 0.00–0.07)
BASOS ABS: 0 10*3/uL (ref 0.0–0.1)
Basophils Relative: 0 %
EOS PCT: 2 %
Eosinophils Absolute: 0.2 10*3/uL (ref 0.0–0.5)
HEMATOCRIT: 31.9 % — AB (ref 36.0–46.0)
HEMOGLOBIN: 10.2 g/dL — AB (ref 12.0–15.0)
Immature Granulocytes: 1 %
LYMPHS ABS: 1.4 10*3/uL (ref 0.7–4.0)
LYMPHS PCT: 13 %
MCH: 27.4 pg (ref 26.0–34.0)
MCHC: 32 g/dL (ref 30.0–36.0)
MCV: 85.8 fL (ref 80.0–100.0)
MONO ABS: 1.5 10*3/uL — AB (ref 0.1–1.0)
Monocytes Relative: 14 %
NEUTROS ABS: 7.4 10*3/uL (ref 1.7–7.7)
Neutrophils Relative %: 70 %
Platelets: 297 10*3/uL (ref 150–400)
RBC: 3.72 MIL/uL — ABNORMAL LOW (ref 3.87–5.11)
RDW: 16.7 % — ABNORMAL HIGH (ref 11.5–15.5)
WBC: 10.8 10*3/uL — ABNORMAL HIGH (ref 4.0–10.5)
nRBC: 0.4 % — ABNORMAL HIGH (ref 0.0–0.2)

## 2018-11-03 LAB — BASIC METABOLIC PANEL
Anion gap: 7 (ref 5–15)
BUN: 30 mg/dL — ABNORMAL HIGH (ref 8–23)
CHLORIDE: 113 mmol/L — AB (ref 98–111)
CO2: 19 mmol/L — AB (ref 22–32)
CREATININE: 0.97 mg/dL (ref 0.44–1.00)
Calcium: 8.3 mg/dL — ABNORMAL LOW (ref 8.9–10.3)
GFR calc non Af Amer: 49 mL/min — ABNORMAL LOW (ref >60)
GFR, EST AFRICAN AMERICAN: 57 mL/min — AB (ref >60)
Glucose, Bld: 96 mg/dL (ref 70–99)
POTASSIUM: 4 mmol/L (ref 3.5–5.1)
SODIUM: 139 mmol/L (ref 135–145)

## 2018-11-03 LAB — HEMOGLOBIN AND HEMATOCRIT, BLOOD
HCT: 30.5 % — ABNORMAL LOW (ref 36.0–46.0)
HEMOGLOBIN: 9.8 g/dL — AB (ref 12.0–15.0)

## 2018-11-03 LAB — PROCALCITONIN

## 2018-11-03 MED ORDER — ENOXAPARIN SODIUM 40 MG/0.4ML ~~LOC~~ SOLN
40.0000 mg | SUBCUTANEOUS | Status: DC
  Administered 2018-11-03: 40 mg via SUBCUTANEOUS
  Filled 2018-11-03: qty 0.4

## 2018-11-03 NOTE — Progress Notes (Signed)
   Subjective:     Patient alert this AM, resting comfortably in bed. Patient reports pain as mild.   Patient seen in rounds for Dr. Wynelle Link. Patient is well, and has had no acute complaints or problems.   Objective: Vital signs in last 24 hours: Temp:  [97.6 F (36.4 C)-98.7 F (37.1 C)] 98.7 F (37.1 C) (11/18 2255) Pulse Rate:  [66-73] 67 (11/18 2255) Resp:  [15-17] 16 (11/18 2255) BP: (139-183)/(55-67) 170/55 (11/18 2255) SpO2:  [97 %-100 %] 97 % (11/18 2255)  Intake/Output from previous day:  Intake/Output Summary (Last 24 hours) at 11/03/2018 0805 Last data filed at 11/03/2018 0600 Gross per 24 hour  Intake 1691.67 ml  Output 1100 ml  Net 591.67 ml    Labs: Recent Labs    10/31/18 1524 11/01/18 0425 11/02/18 0428 11/02/18 2336 11/03/18 0500  HGB 9.5* 7.8* 7.2* 9.8* 10.2*   Recent Labs    11/02/18 0428 11/02/18 2336 11/03/18 0500  WBC 13.4*  --  10.8*  RBC 2.60*  --  3.72*  HCT 23.6* 30.5* 31.9*  PLT 375  --  297   Recent Labs    11/02/18 0428 11/03/18 0500  NA 142 139  K 4.6 4.0  CL 114* 113*  CO2 20* 19*  BUN 35* 30*  CREATININE 1.07* 0.97  GLUCOSE 98 96  CALCIUM 8.4* 8.3*   Exam: General - Patient is Alert Extremity - Neurovascular intact Sensation intact distally Intact pulses distally Able to wiggle toes. Right LE notably shorter than left. Dressing/Incision - clean, dry, no drainage. Knee immobilizer in place. Motor Function - intact, moving foot and toes well on exam.   Past Medical History:  Diagnosis Date  . Blood transfusion 2006  . Colon polyps   . CVA (cerebral infarction) 2013  . Dementia ([place])   . Diverticulosis of colon   . DJD (degenerative joint disease)   . Hyperlipidemia   . Hypertension   . Hypothyroidism   . Melanoma ([place]) 03/20/15   removed with Moh's surgery  . Myelodysplasia 02/04/2012  . Normochromic normocytic anemia 02/04/2012  . Onychomycosis   . Osteoporosis   . Rectal prolapse   . Shoulder pain   .  Unstable gait     Assessment/Plan:    Principal Problem:   Femur fracture, right (HCC) Active Problems:   Myelodysplastic syndrome (HCC)   Hypothyroidism   Hyperlipidemia   Essential hypertension   Vascular dementia without behavioral disturbance (HCC)   History of CVA (cerebrovascular accident)   CKD (chronic kidney disease) stage 3, GFR 30-59 ml/min (HCC)   Diastolic CHF ([place])  Estimated body mass index is 26.72 kg/m as calculated from the following:   Height as of this encounter: 4\' 11"  (1.499 m).   Weight as of this encounter: 60 kg.  DVT Prophylaxis - Aspirin  Knee immobilizer to stay in place. Ready for discharge to SNF from orthopedic standpoint once medically stable. Will follow-up with Dr. Wynelle Link in the office for serial x-rays.  Theresa Duty, PA-C Orthopedic Surgery 11/03/2018, 8:05 AM

## 2018-11-03 NOTE — Progress Notes (Signed)
Facility initiated [facility]. CSW informed patient son-Alan.  CSW will continue to assist with discharge.   Kathrin Greathouse, Marlinda Mike, MSW Clinical Social Worker  [phone] 11/03/2018  4:28 PM

## 2018-11-03 NOTE — Care Management Important Message (Signed)
Important Message  Patient Details  Name: Brittany [name] MRN: [id number] Date of Birth: [birth year]   Medicare Important Message Given:  Yes    Kerin Salen 11/03/2018, 12:08 [place] Message  Patient Details  Name: Brittany [name] MRN: [id number] Date of Birth: [birth year]   Medicare Important Message Given:  Yes    Kerin Salen 11/03/2018, 12:08 PM

## 2018-11-03 NOTE — Progress Notes (Addendum)
PROGRESS NOTE  Brittany [name] WLS:[id number] DOB: October 24, 1924 DOA: 10/31/2018 PCP: System, Pcp Not In  HPI/Recap of past 24 hours: HPI from Dr Lovette Brittany [name] is a 84 y.o. female from SNF, with medical history significant for vascular dementia, bedbound status, hyperlipidemia, hypertension, hypothyroidism, history of myelodysplastic syndrome and pancytopenia, presented to the hospital after sustaining an unwitnessed fall of which she doesn't recall. Pt c/o R hip pain. Of note, pt with hx of multiple fall in the past. Last fall prior to this episode was on 10/25/2018 at the SNF after which she sustained a laceration to the right temporal scalp without hematoma and the laceration was taken care at the ED. Patient does have history of significant dementia, bedbound recently and uses oxygen at the SNF. In the ED, patient was found to have right distal femur fracture, mild AKI and leukocytosis. Orthopedics was consulted.  Chest x-ray showed cardiomegaly.  Patient admitted for further management.   Today, pt looked comfortable, denies any new complaints.  Assessment/Plan: Principal Problem:   Femur fracture, right (HCC) Active Problems:   Myelodysplastic syndrome (Brittany [name])   Hypothyroidism   Hyperlipidemia   Essential hypertension   Vascular dementia without behavioral disturbance (Brittany [name])   History of CVA (cerebrovascular accident)   CKD (chronic kidney disease) stage 3, GFR 30-59 ml/min (HCC)   Diastolic CHF (Brittany [name])  Displaced right mid/distal femur fracture s/p knee immobilizer on 10/31/2018 X-ray right femur showed displaced, angulated right mid/distal femoral shaft fracture Orthopedics on board: Plan is for closed nonoperative treatment for now and if it fails we will convert to an operative procedure Knee immobilizer in place Pain management PT on board: rec SNF  Leukocytosis Afebrile, resolving leukocytosis Likely reactive, due to fracture, ?? Infectious, ?UTI Vs buttock  ulcer Procalcitonin <0.10 Chest x-ray unremarkable UA shows large leukocytes, positive nitrites, many bacteria, greater than 50 WBC Urine culture pending collection Started IV ceftriaxone 2g to also cover buttock ulcer Daily CBC  ?UTI Pt with dementia, unable to communicate symptoms with persistent WBC UA above  UC pending result Continue IV ceftriaxone till UC result is back  Anemia of chronic disease (CKD, myelodysplastic syndrome) Baseline hemoglobin 8-9, currently 7.2, s/p 2U PRBC on 11/02/18 No signs of any bleeding Daily CBC  AKI on CKD stage III Resolved, baseline around 1.2-1.3 S/P gentle IV hydration Avoid nephrotoxic's Daily BMP  Right buttock ulceration with tunneling and erythema present on admission Currently afebrile, with some leukocytosis Completed PO doxycycline for 10 days (started as outpt and continued here) Wound care team consulted, rec IV Fluconazole, continue ceftriaxone  Chronic diastolic HF Appears compensated Echo on 09/22/2018 with EF of 55 to 60%, grade 1 DD  Myelodysplastic syndrome Monitor CBC  Hypertension Continue Lopressor, hydralazine PRN  Hypothyroidism Continue Synthroid  Dementia Continue Aricept    Code Status: DNR  Family Communication: None at bedside  Disposition Plan: Back to SNF, likely 11/04/18 once authorization is approved and urine culture report comes back   Consultants:  Orthopedics  Procedures:  Placement of knee immobilizer  Antimicrobials:  Ceftriaxone  Fluconazole  DVT prophylaxis: Lovenox   Objective: Vitals:   11/02/18 2044 11/02/18 2255 11/03/18 0917 11/03/18 1402  BP: (!) 163/65 (!) 170/55 (!) 160/59 (!) 170/57  Pulse: 66 67 61 63  Resp: 15 16 20 17   Temp: 97.7 F (36.5 C) 98.7 F (37.1 C) 97.9 F (36.6 C) 97.7 F (36.5 C)  TempSrc: Oral Oral Oral Oral  SpO2: 100% 97% 100% 100%  Weight:  Height:        Intake/Output Summary (Last 24 hours) at 11/03/2018 1722 Last data  filed at 11/03/2018 1708 Gross per 24 hour  Intake 1140 ml  Output 1600 ml  Net -460 ml   Filed Weights   10/31/18 1422  Weight: 60 kg    Exam:  General: NAD, appears comfortable, bruising around right eye  Cardiovascular: S1, S2 present  Respiratory: CTAB  Abdomen: Soft, nontender, nondistended, bowel sounds present  Musculoskeletal: No bilateral pedal edema noted  Skin:  Right buttock stage III-IV ulceration with tunneling and surrounding erythema  Psychiatry:  Normal mood   Data Reviewed: CBC: Recent Labs  Lab 10/31/18 1524 11/01/18 0425 11/02/18 0428 11/02/18 2336 11/03/18 0500  WBC 19.6* 13.6* 13.4*  --  10.8*  NEUTROABS 14.6*  --  9.7*  --  7.4  HGB 9.5* 7.8* 7.2* 9.8* 10.2*  HCT 30.3* 25.5* 23.6* 30.5* 31.9*  MCV 88.6 90.1 90.8  --  85.8  PLT 566* 452* 375  --  035   Basic Metabolic Panel: Recent Labs  Lab 10/31/18 1524 11/01/18 0425 11/02/18 0428 11/03/18 0500  NA 141 145 142 139  K 5.0 4.4 4.6 4.0  CL 109 115* 114* 113*  CO2 18* 20* 20* 19*  GLUCOSE 146* 104* 98 96  BUN 46* 39* 35* 30*  CREATININE 1.54* 1.34* 1.07* 0.97  CALCIUM 9.4 8.5* 8.4* 8.3*   GFR: Estimated Creatinine Clearance: 29.2 mL/min (by C-G formula based on SCr of 0.97 mg/dL). Liver Function Tests: No results for input(s): AST, ALT, ALKPHOS, BILITOT, PROT, ALBUMIN in the last 168 hours. No results for input(s): LIPASE, AMYLASE in the last 168 hours. No results for input(s): AMMONIA in the last 168 hours. Coagulation Profile: No results for input(s): INR, PROTIME in the last 168 hours. Cardiac Enzymes: No results for input(s): CKTOTAL, CKMB, CKMBINDEX, TROPONINI in the last 168 hours. BNP (last 3 results) No results for input(s): PROBNP in the last 8760 hours. HbA1C: No results for input(s): HGBA1C in the last 72 hours. CBG: No results for input(s): GLUCAP in the last 168 hours. Lipid Profile: No results for input(s): CHOL, HDL, LDLCALC, TRIG, CHOLHDL, LDLDIRECT in  the last 72 hours. Thyroid Function Tests: No results for input(s): TSH, T4TOTAL, FREET4, T3FREE, THYROIDAB in the last 72 hours. Anemia Panel: No results for input(s): VITAMINB12, FOLATE, FERRITIN, TIBC, IRON, RETICCTPCT in the last 72 hours. Urine analysis:    Component Value Date/Time   COLORURINE YELLOW 11/01/2018 1149   APPEARANCEUR CLOUDY (A) 11/01/2018 1149   LABSPEC 1.011 11/01/2018 1149   LABSPEC 1.015 02/11/2007 1508   PHURINE 5.0 11/01/2018 1149   GLUCOSEU NEGATIVE 11/01/2018 1149   HGBUR SMALL (A) 11/01/2018 1149   BILIRUBINUR NEGATIVE 11/01/2018 1149   BILIRUBINUR Negative 02/11/2007 1508   KETONESUR NEGATIVE 11/01/2018 1149   PROTEINUR NEGATIVE 11/01/2018 1149   UROBILINOGEN 0.2 04/20/2013 1608   NITRITE POSITIVE (A) 11/01/2018 1149   LEUKOCYTESUR LARGE (A) 11/01/2018 1149   LEUKOCYTESUR Small 02/11/2007 1508   Sepsis Labs: @LABRCNTIP (procalcitonin:4,lacticidven:4)  ) Recent Results (from the past 240 hour(s))  MRSA PCR Screening     Status: Abnormal   Collection Time: 11/02/18 11:34 AM  Result Value Ref Range Status   MRSA by PCR POSITIVE (A) NEGATIVE Final    Comment:        The GeneXpert MRSA Assay (FDA approved for NASAL specimens only), is one component of a comprehensive MRSA colonization surveillance program. It is not intended to diagnose MRSA infection  nor to guide or monitor treatment for MRSA infections. RESULT CALLED TO, READ BACK BY AND VERIFIED WITH: TAYLOR,J @ 5859 ON [id number] BY POTEAT,S Performed at [facility], [place] [address] [place], [place] [address]       Studies: No results found.  Scheduled Meds: . aspirin EC  81 mg Oral Daily  . Chlorhexidine Gluconate Cloth  6 each Topical Q0600  . cycloSPORINE  1 drop Both Eyes BID  . donepezil  10 mg Oral QHS  . fluconazole  200 mg Oral q1800  . Gerhardt's butt cream   Topical QID  . levothyroxine  88 mcg Oral QAC breakfast  . metoprolol tartrate  37.5 mg Oral BID   . mupirocin ointment  1 application Nasal BID  . saccharomyces boulardii  250 mg Oral BID  . Sodium Fluoride  1 application dental Daily    Continuous Infusions: . cefTRIAXone (ROCEPHIN)  IV Stopped (11/02/18 2337)     LOS: 3 days     Alma Friendly, MD [facility]   If 7PM-7AM, please contact night-coverage [email or web address] 11/03/2018, 5:22 PM

## 2018-11-03 NOTE — Evaluation (Signed)
Physical Therapy Evaluation Patient Details Name: Brittany [name] MRN: [id number] DOB: 19-Aug-1924 Today's Date: 11/03/2018   History of Present Illness   84 y.o. female from SNF, with medical history significant for vascular dementia, hyperlipidemia, hypertension, hypothyroidism, history of myelodysplastic syndrome and pancytopenia, presented to the hospital after sustaining an unwitnessed fall. Dx of R periprosthetic distal femur fx, s/p closed reduction. Family opted for conservative tx (no surgery).  Pt is NWB and KI is ordered for all times.   Clinical Impression  Pt admitted with above diagnosis. Pt currently with functional limitations due to the deficits listed below (see PT Problem List). +2 total assist for supine to sit, pt sat on edge of bed x 10 minutes. +2 total assist for attempted sit to  Stand with RW, pt unable to come to full standing position 2* pain in RLE. SNF recommended. Pt will benefit from skilled PT to increase their independence and safety with mobility to allow discharge to the venue listed below.       Follow Up Recommendations SNF;Supervision/Assistance - 24 hour;Supervision for mobility/OOB    Equipment Recommendations  None recommended by PT    Recommendations for Other Services       Precautions / Restrictions Precautions Precautions: Fall Precaution Comments: pt's son reports h/o multiple falls Required Braces or Orthoses: Knee Immobilizer - Right Knee Immobilizer - Right: On at all times Restrictions RLE Weight Bearing: Non weight bearing      Mobility  Bed Mobility Overal bed mobility: Needs Assistance Bed Mobility: Supine to Sit;Sit to Supine     Supine to sit: +2 for physical assistance;Total assist Sit to supine: +2 for physical assistance;Total assist   General bed mobility comments: total assist of 2 for raising trunk and advancing LEs to edge of bed and for sit to supine, pain significantly limits activity tolerance  Transfers Overall  transfer level: Needs assistance Equipment used: Rolling walker (2 wheeled) Transfers: Sit to/from Stand Sit to Stand: Total assist;+2 physical assistance;+2 safety/equipment         General transfer comment: +2 total assist  (pt 10%) for sit to stand with RW, pt unable to come to full upright position and unable to tolerate standing 2* pain in R knee, therapist assisted with maintaining NWB on RLE  Ambulation/Gait             General Gait Details: unable  Stairs            Wheelchair Mobility    Modified Rankin (Stroke Patients Only)       Balance Overall balance assessment: Needs assistance Sitting-balance support: Bilateral upper extremity supported;Feet supported Sitting balance-Leahy Scale: Fair       Standing balance-Leahy Scale: Zero                               Pertinent Vitals/Pain Pain Assessment: Faces Faces Pain Scale: Hurts whole lot Pain Location: R knee Pain Descriptors / Indicators: Moaning;Grimacing;Guarding Pain Intervention(s): Limited activity within patient's tolerance;Monitored during session;Premedicated before session(attempted ice but pt didn't tolerate pressure from ice pack)    Home Living Family/patient expects to be discharged to:: Skilled nursing facility                 Additional Comments: [place]    Prior Function Level of Independence: Needs assistance   Gait / Transfers Assistance Needed: Prior to September 2019 admission was able to ambulate to the dining room and toilet  herself.  She has been receiving PT and was able to walk 15' with mod assist with RW  ADL's / Homemaking Assistance Needed: assist needed        Hand Dominance   Dominant Hand: Right    Extremity/Trunk Assessment        Lower Extremity Assessment Lower Extremity Assessment: RLE deficits/detail RLE Deficits / Details: pt has significant pain with minimal movement of RLE, able to actively DF/PF R ankle ~50% of  full ROM, knee in KI at all times per order so not assessed RLE: Unable to fully assess due to immobilization;Unable to fully assess due to pain       Communication   Communication: HOH  Cognition Arousal/Alertness: Awake/alert Behavior During Therapy: WFL for tasks assessed/performed Overall Cognitive Status: History of cognitive impairments - at baseline                                 General Comments: pt oriented to self only, not to location/situation      General Comments      Exercises     Assessment/Plan    PT Assessment Patient needs continued PT services  PT Problem List Decreased strength;Decreased range of motion;Decreased activity tolerance;Decreased mobility;Decreased cognition;Decreased balance       PT Treatment Interventions DME instruction;Gait training;Therapeutic exercise;Therapeutic activities;Functional mobility training;Balance training;Patient/family education    PT Goals (Current goals can be found in the Care Plan section)  Acute Rehab PT Goals Patient Stated Goal: return to [facility] PT Goal Formulation: With family Time For Goal Achievement: 11/17/18 Potential to Achieve Goals: Fair    Frequency Min 3X/week   Barriers to discharge        Co-evaluation               AM-PAC PT "6 Clicks" Daily Activity  Outcome Measure Difficulty turning over in bed (including adjusting bedclothes, sheets and blankets)?: Unable Difficulty moving from lying on back to sitting on the side of the bed? : Unable Difficulty sitting down on and standing up from a chair with arms (e.g., wheelchair, bedside commode, etc,.)?: Unable Help needed moving to and from a bed to chair (including a wheelchair)?: Total Help needed walking in hospital room?: Total Help needed climbing 3-5 steps with a railing? : Total 6 Click Score: 6    End of Session Equipment Utilized During Treatment: Gait belt Activity Tolerance: Patient limited by  pain Patient left: in bed;with call bell/phone within reach;with bed alarm set;with family/visitor present Nurse Communication: Mobility status PT Visit Diagnosis: Unsteadiness on feet (R26.81);Other abnormalities of gait and mobility (R26.89);Repeated falls (R29.6);Difficulty in walking, not elsewhere classified (R26.2);Pain Pain - Right/Left: Right Pain - part of body: Knee    Time: 9390-3009 PT Time Calculation (min) (ACUTE ONLY): 32 min   Charges:   PT Evaluation $PT Eval Low Complexity: 1 Low PT Treatments $Therapeutic Activity: 8-22 mins        Blondell Reveal Kistler PT 11/03/2018  Acute Rehabilitation Services Pager [phone] Office [phone]

## 2018-11-03 NOTE — Progress Notes (Signed)
CSW spoke to the SNF liaison [place]. She informed CSW the patient was working with physical therapy at the facility. She reports the last note indicated the patient walked 50 feet with rolling walker and moderate assistance. The patient is not bed bound.  CSW informed the nurse. Patient has immobilizer. Nurse will determine if PT will see the patient.   Kathrin Greathouse, Marlinda Mike, MSW Clinical Social Worker  [phone] 11/03/2018  12:45 PM

## 2018-11-03 NOTE — Plan of Care (Signed)
Pt stable with no needs. No changes to note. No s/s of distress or pain. Pt continues to be weak and confused overall. Will continue to monitor.

## 2018-11-03 NOTE — NC FL2 (Addendum)
[place] LEVEL OF CARE SCREENING TOOL     IDENTIFICATION  Patient Name: Brittany [name] Birthdate: [birth year] Sex: female Admission Date (Current Location): 10/31/2018  [facility] and [place] Number:  Herbalist and Address:  [facility],  [place], [place]      Provider Number: [id number]  Attending Physician Name and Address:  Alma Friendly, MD  Relative Name and Phone Number:    Tniya, [place] [phone]    Current Level of Care: Hospital Recommended Level of Care: [place] Prior Approval Number:    Date Approved/Denied:   PASRR Number: [id number]  Discharge Plan: SNF    Current Diagnoses: Patient Active Problem List   Diagnosis Date Noted  . Femur fracture, right ([place]) 10/31/2018  . Pancytopenia ([place]) 09/24/2018  . Palliative care encounter   . Perirectal inflammation 09/20/2018  . Weight loss 07/28/2018  . Actinic keratosis 06/26/2018  . Diastolic CHF ([place]) 93/57/0177  . History of CVA (cerebrovascular accident) 08/08/2017  . CKD (chronic kidney disease) stage 3, GFR 30-59 ml/min (HCC) 08/08/2017  . Anemia, iron deficiency 01/11/2016  . Unstable gait   . Shoulder pain   . History of melanoma 03/20/2015  . Constipation 07/18/2014  . Vascular dementia without behavioral disturbance ([place]) 11/11/2012  . Stroke ([place]) 12/17/2011  . Back pain 04/23/2011  . COLONIC POLYPS, HX OF 06/05/2009  . Myelodysplastic syndrome ([place]) 01/02/2009  . Hypothyroidism 08/21/2007  . Hyperlipidemia 08/21/2007  . Essential hypertension 08/21/2007  . DIVERTICULOSIS, COLON 08/21/2007  . OSTEOPOROSIS 08/21/2007  . SKIN CANCER, HX OF 08/21/2007    Orientation RESPIRATION BLADDER Height & Weight     Self(Memory Impairment )  Normal Incontinent Weight: 132 lb 4.4 oz (60 kg) Height:  4\' 11"  (149.9 cm)  BEHAVIORAL SYMPTOMS/MOOD NEUROLOGICAL BOWEL NUTRITION STATUS      Incontinent Diet(See discharge Summary)   AMBULATORY STATUS COMMUNICATION OF NEEDS Skin   Extensive Assist Verbally (Pressure Injury: Sacrum)     Stage 3 pressure injury to coccygeal area with undermining Wound type: Pressure Pressure Injury POA: Yes Measurement: 4cm x 3.5cm x 1.5cm with 1.2cm undermining at 6 o'clock, 1cm undermining at 9 o'clock Wound bed: pink, nongranulating with roll wound edges Drainage (amount, consistency, odor) moderate amount of light yellow, thin exudate Periwound:periwound erythema (blanching, without warmth or induration, consistent with funal overgrowth). Dressing procedure/placement/frequency: Orders for daily and PRN soiling dressing changes using a silver hydrofiber.  Wound care is complicated with urinary and fecal incontinence, also discomfort when turning due fracture.                     Personal Care Assistance Level of Assistance  Bathing, Feeding, Dressing Bathing Assistance: Maximum assistance Feeding assistance: Independent Dressing Assistance: Maximum assistance     Functional Limitations Info  Sight, Hearing, Speech Sight Info: Adequate Hearing Info: Adequate Speech Info: Adequate    SPECIAL CARE FACTORS FREQUENCY  PT (By licensed PT), OT (By licensed OT)  5x/ week                   Contractures Contractures Info: Not present    Additional Factors Info  Code Status, Allergies, Psychotropic Code Status Info: DNR  Allergies Info: Allergies: Lisinopril, Penicillins           Current Medications (11/03/2018):  This is the current hospital active medication list Current Facility-Administered Medications  Medication Dose Route Frequency Provider Last Rate Last Dose  . aspirin EC tablet 81  mg  81 mg Oral Daily Pokhrel, Laxman, MD   81 mg at 11/03/18 0923  . cefTRIAXone (ROCEPHIN) 2 g in sodium chloride 0.9 % 100 mL IVPB  2 g Intravenous q1800 Alma Friendly, MD   Stopped at 11/02/18 2337  . Chlorhexidine Gluconate Cloth 2 % PADS 6 each  6 each Topical  Q0600 Alma Friendly, MD   6 each at 11/03/18 [phone]  . cycloSPORINE (RESTASIS) 0.05 % ophthalmic emulsion 1 drop  1 drop Both Eyes BID Pokhrel, Laxman, MD   1 drop at 11/03/18 0928  . donepezil (ARICEPT) tablet 10 mg  10 mg Oral QHS Pokhrel, Laxman, MD   10 mg at 11/02/18 2255  . doxycycline (VIBRA-TABS) tablet 100 mg  100 mg Oral Q12H Pokhrel, Laxman, MD   100 mg at 11/03/18 0923  . fluconazole (DIFLUCAN) tablet 200 mg  200 mg Oral q1800 Luiz Ochoa, RPH   200 mg at 11/02/18 2258  . Gerhardt's butt cream   Topical QID Alma Friendly, MD   Stopped at 11/02/18 2200  . hydrALAZINE (APRESOLINE) injection 10 mg  10 mg Intravenous Q6H PRN Pokhrel, Laxman, MD      . levothyroxine (SYNTHROID, LEVOTHROID) tablet 88 mcg  88 mcg Oral QAC breakfast Pokhrel, Laxman, MD   88 mcg at 11/03/18 0630  . metoprolol tartrate (LOPRESSOR) tablet 37.5 mg  37.5 mg Oral BID Pokhrel, Laxman, MD   37.5 mg at 11/03/18 0922  . morphine 2 MG/ML injection 2 mg  2 mg Intravenous Q3H PRN Pokhrel, Laxman, MD   2 mg at 11/02/18 1739  . mupirocin ointment (BACTROBAN) 2 % 1 application  1 application Nasal BID Alma Friendly, MD   1 application at 39/43/20 [phone]  . polyethylene glycol (MIRALAX / GLYCOLAX) packet 17 g  17 g Oral Daily PRN Pokhrel, Laxman, MD      . polyvinyl alcohol (LIQUIFILM TEARS) 1.4 % ophthalmic solution 1 drop  1 drop Both Eyes Q2H PRN Pokhrel, Laxman, MD   1 drop at 11/01/18 1018  . saccharomyces boulardii (FLORASTOR) capsule 250 mg  250 mg Oral BID Pokhrel, Laxman, MD   250 mg at 11/03/18 0923  . Sodium Fluoride 1.1 % PSTE 1 application  1 application dental Daily Pokhrel, Laxman, MD         Discharge Medications: Please see discharge summary for a list of discharge medications.  Relevant Imaging Results:  Relevant Lab Results:   Additional Information SSN: [id number]  Lia Hopping, LCSW

## 2018-11-04 DIAGNOSIS — S72401A Unspecified fracture of lower end of right femur, initial encounter for closed fracture: Secondary | ICD-10-CM

## 2018-11-04 DIAGNOSIS — L98413 Non-pressure chronic ulcer of buttock with necrosis of muscle: Secondary | ICD-10-CM

## 2018-11-04 LAB — BASIC METABOLIC PANEL
Anion gap: 8 (ref 5–15)
BUN: 29 mg/dL — ABNORMAL HIGH (ref 8–23)
CALCIUM: 8.5 mg/dL — AB (ref 8.9–10.3)
CO2: 20 mmol/L — ABNORMAL LOW (ref 22–32)
Chloride: 110 mmol/L (ref 98–111)
Creatinine, Ser: 0.84 mg/dL (ref 0.44–1.00)
GFR calc Af Amer: >60 mL/min (ref >60)
GFR, EST NON AFRICAN AMERICAN: 59 mL/min — AB (ref >60)
Glucose, Bld: 94 mg/dL (ref 70–99)
POTASSIUM: 3.8 mmol/L (ref 3.5–5.1)
SODIUM: 138 mmol/L (ref 135–145)

## 2018-11-04 LAB — CBC WITH DIFFERENTIAL/PLATELET
ABS IMMATURE GRANULOCYTES: 0.12 10*3/uL — AB (ref 0.00–0.07)
BASOS PCT: 0 %
Basophils Absolute: 0 10*3/uL (ref 0.0–0.1)
Eosinophils Absolute: 0.2 10*3/uL (ref 0.0–0.5)
Eosinophils Relative: 2 %
HCT: 33 % — ABNORMAL LOW (ref 36.0–46.0)
Hemoglobin: 10.8 g/dL — ABNORMAL LOW (ref 12.0–15.0)
IMMATURE GRANULOCYTES: 1 %
Lymphocytes Relative: 16 %
Lymphs Abs: 1.5 10*3/uL (ref 0.7–4.0)
MCH: 27.8 pg (ref 26.0–34.0)
MCHC: 32.7 g/dL (ref 30.0–36.0)
MCV: 84.8 fL (ref 80.0–100.0)
MONO ABS: 1.5 10*3/uL — AB (ref 0.1–1.0)
Monocytes Relative: 16 %
NEUTROS ABS: 6.3 10*3/uL (ref 1.7–7.7)
NEUTROS PCT: 65 %
PLATELETS: 309 10*3/uL (ref 150–400)
RBC: 3.89 MIL/uL (ref 3.87–5.11)
RDW: 17 % — ABNORMAL HIGH (ref 11.5–15.5)
WBC: 9.8 10*3/uL (ref 4.0–10.5)
nRBC: 0.4 % — ABNORMAL HIGH (ref 0.0–0.2)

## 2018-11-04 MED ORDER — OXYCODONE HCL 5 MG PO TABS: 2.5000 mg | ORAL_TABLET | Freq: Three times a day (TID) | ORAL | 0 refills | Status: DC | PRN

## 2018-11-04 MED ORDER — CEPHALEXIN 500 MG PO CAPS: 500.0000 mg | ORAL_CAPSULE | Freq: Two times a day (BID) | ORAL | 0 refills | Status: DC

## 2018-11-04 MED ORDER — GERHARDT'S BUTT CREAM: 1.0000 "application " | TOPICAL_CREAM | Freq: Four times a day (QID) | CUTANEOUS | 0 refills | Status: DC

## 2018-11-04 MED ORDER — ACETAMINOPHEN 500 MG PO TABS
1000.0000 mg | ORAL_TABLET | Freq: Once | ORAL | Status: AC
  Administered 2018-11-04: 1000 mg via ORAL
  Filled 2018-11-04: qty 2

## 2018-11-04 MED ORDER — OXYCODONE HCL 5 MG PO TABS
2.5000 mg | ORAL_TABLET | ORAL | Status: DC | PRN
  Administered 2018-11-04: 2.5 mg via ORAL
  Filled 2018-11-04: qty 1

## 2018-11-04 NOTE — Discharge Summary (Signed)
**Note De-Identified vi Obfusction** Physicin Dischrge Summry  Miquel Costbile ZOX:[id number] DOB: My 15, 1925 DOA: 10/31/2018  PCP: System, Pcp Not In  Admit dte: 10/31/2018 Dischrge dte: 11/04/2018  Time spent: over 40 minutes  Recommendtions for Outptient Follow-up:  1. Follow up outptient CBC/CMP  2. Follow sensitivities for urine culture, dischrged on keflex for e. Coli UTI bsed on ntibiogrm 3. Follow up with wound cre for her scrl decub.  Pt hs ppointment for 11/21.  Consider dditionl doses of fluconzole s needed.  4. Follow up with orthopedics with Dr. Mureen Rlphs in 2 weeks will need seril x ry.  Nonweight bering to right lower extremity.  Continue PT.  Aspirin for DVT ppx. 5. Follow up with outptient PCP regrding tretment/follow up of osteoporosis   Dischrge Dignoses:  Principl Problem:   Femur frcture, right ([place]) Active Problems:   Myelodysplstic syndrome ([place])   Hypothyroidism   Hyperlipidemi   [place] hypertension   Vsculr dementi without behviorl disturbnce ([place])   History of CVA (cerebrovsculr ccident)   CKD (chronic kidney disese) stge 3, GFR 30-59 ml/min (HCC)   Distolic CHF (HCC)   Pressure injury of skin   Dischrge Condition: stble  Diet recommendtion: hert helthy  Filed Weights   10/31/18 1422  Weight: 60 kg    History of present illness:  HPI from Dr Stsi Cvlier Whiteis  84 y.o.femlefrom SNF, with medicl history significntforvsculr dementi, bedbound sttus, hyperlipidemi, hypertension, hypothyroidism, history of myelodysplstic syndrome nd pncytopeni, presented to the hospitl fter sustining n unwitnessed fll of which she doesn't recll. Pt c/o R hip pin. Of note, pt with hx of multiple fll in the pst. Lst fll prior to this episode ws on 10/25/2018 t the SNF fter which she sustined  lcertion to the right temporl sclp without hemtom nd the lcertion ws tken cre t the ED. Ptient does hve history of  significnt dementi, bedbound recently nd uses oxygen t the SNF. In the ED, ptient ws found to hveright distl femur frcture,mild AKI nd leukocytosis. Orthopedics ws consulted. Chest x-ry showed crdiomegly. Ptient dmitted for further mngement.  She ws dmitted for  femur frcture nd seen by ortho who plnned for non opertive mngement with knee immobilizer on 11/16.  She ws strted on bx for UTI nd received fluconzole for concern for cndidl infection round her decubitus ulcer.     See below for dditionl detils  Hospitl Course:  Displced right mid/distl femur frcture s/p knee immobilizer on 10/31/2018 X-ry right femur showed displced, ngulted right mid/distl femorl shft frcture Orthopedics on bord: Pln is for closed nonopertive tretment for now nd if it fils we will convert to n opertive procedure Knee immobilizer in plce Pin mngement - will d/c with APAP nd low dose oxy prn (she received  few doses of IV morphine here) ASA for DVT ppx Non weight bering PT on bord: rec SNF Follow up with orthopedics In 2 weeks, nonweight bering to RLE with RLE in knee immobilizer t ll times until follow up in 2 weeks.  Will need seril x rys to ssure proper heling of fx.  Leukocytosis Afebrile, resolving leukocytosis Likely rective, due to frcture (potentil sources of infection re UTI nd scrl decub, though decub does not pper infected) Proclcitonin <0.10 Chest x-ry unremrkble E. Coli UTI, pln to d/c on keflex, follow finl sensitivities Dily CBC  UTI E coli, sensitivities pending Will d/c with keflex given ntibiogrm  Anemi of chronic disese (CKD, myelodysplstic syndrome) Bseline hemoglobin 8-9, currently 7.2, s/p 2U PRBC on 11/02/18 **Note De-Identified vi Obfusction** H/H stble, continue to follow Dily CBC  KI on CKD stge III Resolved, bseline round 1.2-1.3 S/P gentle IV hydrtion void nephrotoxic's Dily BMP  Decubitus Ulcer   Right buttock ulcertion with tunneling nd erythem present on dmission Currently febrile, with some leukocytosis Completed PO doxycycline for 10 dys (strted s outpt nd continued here) Wound cre tem consulted, rec Fluconzole.  Received 2 doses of fluconzole, consider dditionl doses outptient s needed.  Wound cre recs s noted below. Follow up with wound cre ppt s outptient on 11/21   Chronic distolic HF ppers compensted Echo on 09/22/2018 with EF of 55 to 60%, grde 1 DD  Myelodysplstic syndrome Monitor CBC  Hypertension Continue Lopressor, hydrlzine PRN  Hypothyroidism Continue Synthroid  Dementi Continue ricept   Procedures:  none   Consulttions:  orthopedics  Dischrge Exm: Vitls:   11/04/18 0459 11/04/18 0902  BP: (!) 158/65 (!) 137/52  Pulse: 73 71  Resp: 18   Temp:    SpO2: 95% 95%   &Ox1-2 (knee [place] nd hospitl, but not [facility], not dte) Pin controlled.   No complints. Discussed pln of cre with son over phone.  Generl: No cute distress. HEENT: scb over L forehed with dried blood Crdiovsculr: Hert sounds show  regulr rte, nd rhythm. Lungs: Cler to usculttion bilterlly with good ir movement. No rles, rhonchi or wheezes. bdomen: Soft, nontender, nondistended  Neurologicl: lert nd oriented 1-2. Moves ll extremities 4. Crnil nerves II through XII grossly intct. Skin: Wrm nd dry. No rshes or lesions. Stge III decubitus ulcer with surrounding redness, ppers fungl  Extremities: R knee immobilizer Psychitric: Mood nd ffect re norml. Insight nd judgment re pproprite.  Dischrge Instructions   Dischrge Instructions    Cll MD / Cll 911   Complete by:  s directed    If you experience chest pin or shortness of breth, CLL 911 nd be trnsported to the hospitl emergency room.  If you develope  fever bove 101 F, pus ([place] dringe) or incresed dringe or redness  t the wound, or clf pin, cll your surgeon's office.   Cll MD for:  difficulty brething, hedche or visul disturbnces   Complete by:  s directed    Cll MD for:  extreme ftigue   Complete by:  s directed    Cll MD for:  persistnt nuse nd vomiting   Complete by:  s directed    Cll MD for:  redness, tenderness, or signs of infection (pin, swelling, redness, odor or green/yellow dischrge round incision site)   Complete by:  s directed    Cll MD for:  severe uncontrolled pin   Complete by:  s directed    Cll MD for:  temperture >100.4   Complete by:  s directed    Diet - low sodium hert helthy   Complete by:  s directed    Dischrge instructions   Complete by:  s directed    You were seen for  femur frcture.  We've plced  knee immobilizer nd the pln is for you to follow up with Dr. Mureen Rlphs s n outptient.  You're non weight bering on this leg.  Continue wound cre for your ulcer to your buttocks.  Continue keflex for  UTI.  You PCP should follow finl sensitivities for this.  Return for new, recurrent, or worsening symptoms.  Plese sk your PCP to request records from this hospitliztion so they know wht ws done nd wht the next steps will be. **Note De-Identified vi Obfusction** Dischrge wound cre:   Complete by:  As directed    Chnge dressing to scrl decub dily nd s needed for soiling with silver hydrofiber dressing Consider need for dditionl doses of systemic ntifungl s outptient   Increse ctivity slowly   Complete by:  As directed    Non weight bering   Complete by:  As directed    RLE to sty in knee immobilizer t ll times until determined by physicin.     Allergies s of 11/04/2018      Rections   Lisinopril    REACTION: ngioedem   Penicillins    REACTION: rsh Hs ptient hd  PCN rection cusing immedite rsh, fcil/tongue/throt swelling, SOB or lighthededness with hypotension: Unknown Hs ptient hd  PCN rection cusing  severe rsh involving mucus membrnes or skin necrosis: Unknown Hs ptient hd  PCN rection tht required hospitliztion: Unknown Hs ptient hd  PCN rection occurring within the lst 10 yers: Unknown If ll of the bove nswers re "NO", then my proceed with Cephlosporin use.      Mediction List    STOP tking these medictions   doxycycline 100 MG EC tblet Commonly known s:  DORYX   trMADol 50 MG tblet Commonly known s:  ULTRAM     TAKE these medictions   cetminophen 500 MG tblet Commonly known s:  TYLENOL Tke 500 mg by mouth every 8 (eight) hours s needed for mild pin.   spirin EC 81 MG tblet Tke 81 mg by mouth dily.   cephALEXin 500 MG cpsule Commonly known s:  KEFLEX Tke 1 cpsule (500 mg totl) by mouth 2 (two) times dily for 5 dys.   cycloSPORINE 0.05 % ophthlmic emulsion Commonly known s:  RESTASIS Plce 1 drop into both eyes 2 (two) times dily.   donepezil 10 MG tblet Commonly known s:  ARICEPT Tke 10 mg by mouth t bedtime.   Gerhrdt's butt crem Cre Apply 1 ppliction topiclly 4 (four) times dily. Apply in  thin lyer to periwound tissue, ffected res on buttock, medil thighs nd perinel res fter clensing.   hydroxypropyl methylcellulose / hypromellose 2.5 % ophthlmic solution Commonly known s:  ISOPTO TEARS / GONIOVISC Plce 1 drop into both eyes every 2 (two) hours s needed for dry eyes.   levothyroxine 88 MCG tblet Commonly known s:  SYNTHROID, LEVOTHROID Tke 1 tblet (88 mcg totl) by mouth dily before brekfst.   Metoprolol Trtrte 37.5 MG Tbs Tke 37.5 mg by mouth 2 (two) times dily.   oxyCODONE 5 MG immedite relese tblet Commonly known s:  Oxy IR/ROXICODONE Tke 0.5 tblets (2.5 mg totl) by mouth every 8 (eight) hours s needed for up to 5 dys.   polyethylene glycol powder powder Commonly known s:  GLYCOLAX/MIRALAX Tke 17 g by mouth dily s needed for mild constiption.    PREVIDENT 5000 BOOSTER PLUS 1.1 % Pste Generic drug:  Sodium Fluoride Plce 1 ppliction onto teeth dily.   scchromyces boulrdii 250 MG cpsule Commonly known s:  FLORASTOR Tke 250 mg by mouth 2 (two) times dily.            Dischrge Cre Instructions  (From dmission, onwrd)         Strt     Ordered   11/04/18 0000  Dischrge wound cre:    Comments:  Chnge dressing to scrl decub dily nd s needed for soiling with silver hydrofiber dressing Consider need for dditionl doses of systemic ntifungl s outptient **Note De-Identified vi Obfusction** 11/04/18 1057   11/03/18 0000  Non weight bering    Comments:  RLE to sty in knee immobilizer t ll times until determined by physicin.   11/03/18 0811         Allergies  Allergen Rections  . Lisinopril     REACTION: ngioedem  . Penicillins     REACTION: rsh Hs ptient hd  PCN rection cusing immedite rsh, fcil/tongue/throt swelling, SOB or lighthededness with hypotension: Unknown Hs ptient hd  PCN rection cusing severe rsh involving mucus membrnes or skin necrosis: Unknown Hs ptient hd  PCN rection tht required hospitliztion: Unknown Hs ptient hd  PCN rection occurring within the lst 10 yers: Unknown If ll of the bove nswers re "NO", then my proceed with Cephlosporin use.    Contct informtion for follow-up providers    Gynelle Arbin, MD. Schedule n ppointment s soon s possible for  visit on 11/17/2018.   Specilty:  Orthopedic Surgery Contct informtion: [address] [place] [address] [phone]            Contct informtion for fter-dischrge cre    Destintion    [facility] .   Service:  Skilled Nursing Contct informtion: [address] [phone]                   The results of significnt dignostics from this hospitliztion (including imging, microbiology, ncillry  nd lbortory) re listed below for reference.    Significnt Dignostic Studies: Dg Elbow Complete Right  Result Dte: 10/31/2018 CLINICAL DATA:  Lterl right elbow pin nd brsions following  fll tody. EXAM: RIGHT ELBOW - COMPLETE 3+ VIEW COMPARISON:  None. FINDINGS: There is no evidence of frcture, disloction, or joint effusion. There is no evidence of rthropthy or other focl bone bnormlity. Soft tissues re unremrkble. IMPRESSION: No frcture or disloction. Electroniclly Signed   By: Cludie Revering M.D.   On: 10/31/2018 15:20   Dg Chest Port 1 View  Result Dte: 10/31/2018 CLINICAL DATA:  Fll, hypertension EXAM: PORTABLE CHEST 1 VIEW COMPARISON:  09/16/2018 FINDINGS: Crdiomegly. Lungs cler. No effusions. No overt edem. No cute bony bnormlity. Scoliosis nd degenertive chnges in the thorcic spine. IMPRESSION: Crdiomegly.  No ctive disese. Electroniclly Signed   By: Rolm Bptise M.D.   On: 10/31/2018 16:49   Dg Shoulder Right Portble  Result Dte: 10/31/2018 CLINICAL DATA:  Fll, right shoulder pin EXAM: PORTABLE RIGHT SHOULDER COMPARISON:  None. FINDINGS: No frcture or disloction is seen. Moderte to severe degenertive chnges with chronic rottor cuff ter. Visulized soft tissues re within norml limits. Visulized right lung is cler. IMPRESSION: No frcture or disloction is seen. Moderte to severe degenertive chnges. Electroniclly Signed   By: Julin Hy M.D.   On: 10/31/2018 15:21   Dg Hip Unilt W Or Wo Pelvis 2-3 Views Right  Result Dte: 10/31/2018 CLINICAL DATA:  Fll, right femur pin EXAM: DG HIP (WITH OR WITHOUT PELVIS) 2-3V RIGHT COMPARISON:  None. FINDINGS: No evidence of frcture disloction of the right hip. Visulized bony pelvis ppers intct. Bilterl hip joint spces re symmetric. Distl femorl shft frcture, described on dedicted femur rdiogrphs. IMPRESSION: No evidence of frcture disloction of the right hip.  Distl femorl shft frcture, described on dedicted femur rdiogrphs. Electroniclly Signed   By: Julin Hy M.D.   On: 10/31/2018 15:24   Dg Femur Min 2 Views Right  Result Dte: 10/31/2018 CLINICAL DATA:  Fll, right **Note De-Identified vi Obfusction** femur pin EXM: RIGHT FEMUR 2 VIEWS COMPRISON:  None. FINDINGS: Displced, ngulted right mid/distl femorl shft frcture. pex dorsl ngultion is suspected, lthough exct orienttion is difficult due to difficulty with ptient positioning. Right knee rthroplsty. IMPRESSION: Displced, ngulted right mid/distl femorl shft frcture. Electroniclly Signed   By: Julin Hy M.D.   On: 10/31/2018 15:26   Dg Femur Port, Min 2 Views Right  Result Dte: 10/31/2018 CLINICL DT:  Sttus post trction nd knee immobiliztion for known right distl femorl frcture. Initil encounter. EXM: RIGHT FEMUR PORTBLE 2 VIEW COMPRISON:  None. FINDINGS: Sttus post trction nd immobiliztion of the right femur, there is resolution of previously noted ngultion t the distl femorl frcture, though pproximtely 9 cm of shortening is noted, with medil nd nterior displcement of the distl femur, nd mild rottion. The ptient's totl knee rthroplsty is grossly unremrkble in ppernce. No new frctures re seen. The right femorl hed remins seted t the cetbulum. IMPRESSION: Sttus post trction nd immobiliztion of the right femur, there is resolution of previously noted ngultion t the distl femorl frcture, though pproximtely 9 cm of shortening is noted, with medil nd nterior displcement of the distl femur, nd mild rottion. Electroniclly Signed   By: Grld Blding M.D.   On: 10/31/2018 22:04    Microbiology: Recent Results (from the pst 240 hour(s))  MRS PCR Screening     Sttus: bnorml   Collection Time: 11/02/18 11:34 M  Result Vlue Ref Rnge Sttus   MRS by PCR POSITIVE () NEGTIVE Finl    Comment:        The GeneXpert MRS  ssy (FD pproved for NSL specimens only), is one component of  comprehensive MRS coloniztion surveillnce progrm. It is not intended to dignose MRS infection nor to guide or monitor tretment for MRS infections. RESULT CLLED TO, RED BCK BY ND VERIFIED WITH: TYLOR,J @ [id number] ON [id number] BY POTET,S Performed t [facility], [place] [address] [place], [place] [address]   Urine Culture     Sttus: bnorml (Preliminry result)   Collection Time: 11/02/18  5:39 PM  Result Vlue Ref Rnge Sttus   Specimen Description   Finl    URINE, RNDOM Performed t [facility] [address], [place], [place] [address]    Specil Requests   Finl    NONE Performed t [facility], [place] [address], [place], [place] [address]    Culture >=100,000 COLONIES/mL ESCHERICHI COLI ()  Finl   Report Sttus PENDING  Incomplete     Lbs: Bsic Metbolic Pnel: Recent Lbs  Lb 10/31/18 1524 11/01/18 0425 11/02/18 0428 11/03/18 0500 11/04/18 0529  N 141 145 142 139 138  K 5.0 4.4 4.6 4.0 3.8  CL 109 115* 114* 113* 110  CO2 18* 20* 20* 19* 20*  GLUCOSE 146* 104* 98 96 94  BUN 46* 39* 35* 30* 29*  CRETININE 1.54* 1.34* 1.07* 0.97 0.84  CLCIUM 9.4 8.5* 8.4* 8.3* 8.5*   Liver Function Tests: No results for input(s): ST, LT, LKPHOS, BILITOT, PROT, LBUMIN in the lst 168 hours. No results for input(s): LIPSE, MYLSE in the lst 168 hours. No results for input(s): MMONI in the lst 168 hours. CBC: Recent Lbs  Lb 10/31/18 1524 11/01/18 0425 11/02/18 0428 11/02/18 2336 11/03/18 0500 11/04/18 0529  WBC 19.6* 13.6* 13.4*  --  10.8* 9.8  NEUTROBS 14.6*  --  9.7*  --  7.4 6.3  HGB 9.5* 7.8* 7.2* 9.8* 10.2* 10.8*  HCT 30.3* 25.5* 23.6* 30.5* 31.9* 33.0*  MCV 88.6 90.1 90.8  --  85.8 84.8  PLT 566* 452* 375  --  297 309   Cardiac Enzymes: No results for input(s): CKTOTAL, CKMB, CKMBINDEX, TROPONINI in the  last 168 hours. BNP: BNP (last 3 results) Recent Labs    10/31/18 1738  BNP 64.7    ProBNP (last 3 results) No results for input(s): PROBNP in the last 8760 hours.  CBG: No results for input(s): GLUCAP in the last 168 hours.     Signed:  Fayrene Helper MD.  [facility] 11/04/2018, 11:00 AM

## 2018-11-04 NOTE — Plan of Care (Signed)
  Problem: Clinical Measurements: Goal: Will remain free from infection Outcome: Adequate for Discharge   Problem: Clinical Measurements: Goal: Respiratory complications will improve Outcome: Adequate for Discharge   Problem: Clinical Measurements: Goal: Cardiovascular complication will be avoided Outcome: Adequate for Discharge   Problem: Activity: Goal: Risk for activity intolerance will decrease Outcome: Adequate for Discharge   Problem: Nutrition: Goal: Adequate nutrition will be maintained Outcome: Adequate for Discharge   Problem: Elimination: Goal: Will not experience complications related to bowel motility Outcome: Adequate for Discharge

## 2018-11-04 NOTE — Progress Notes (Signed)
Patient returning to [place] received.  [facility] called for transport.  Nurse given the number to call report.  CSW notified patient son Antony Haste.   Kathrin Greathouse, Marlinda Mike, MSW Clinical Social Worker  [phone] 11/04/2018  3:56 PM

## 2018-11-04 NOTE — Progress Notes (Addendum)
   Subjective:    Patient reports pain as mild.   Patient seen in rounds for Dr. Wynelle Link. Patient is well, and has had no acute complaints or problems other than discomfort in the right LE. Denies chest pain or SOB. Patient resting comfortably in bed this AM, easily arousable.  Objective: Vital signs in last 24 hours: Temp:  [97.7 F (36.5 C)-98.1 F (36.7 C)] 97.9 F (36.6 C) (11/19 2108) Pulse Rate:  [61-78] 73 (11/20 0459) Resp:  [16-20] 18 (11/20 0459) BP: (137-170)/(48-65) 158/65 (11/20 0459) SpO2:  [95 %-100 %] 95 % (11/20 0459)  Intake/Output from previous day:  Intake/Output Summary (Last 24 hours) at 11/04/2018 0754 Last data filed at 11/04/2018 0616 Gross per 24 hour  Intake 933.76 ml  Output 1650 ml  Net -716.24 ml    Labs: Recent Labs    11/02/18 0428 11/02/18 2336 11/03/18 0500 11/04/18 0529  HGB 7.2* 9.8* 10.2* 10.8*   Recent Labs    11/03/18 0500 11/04/18 0529  WBC 10.8* 9.8  RBC 3.72* 3.89  HCT 31.9* 33.0*  PLT 297 309   Recent Labs    11/03/18 0500 11/04/18 0529  NA 139 138  K 4.0 3.8  CL 113* 110  CO2 19* 20*  BUN 30* 29*  CREATININE 0.97 0.84  GLUCOSE 96 94  CALCIUM 8.3* 8.5*   Exam: General - Patient is Alert Extremity - Neurologically intact Neurovascular intact Sensation intact distally Able to wiggle toes.  Thigh compartment soft. Motor Function - intact, moving foot and toes well on exam.   Past Medical History:  Diagnosis Date  . Blood transfusion 2006  . Colon polyps   . CVA (cerebral infarction) 2013  . Dementia ([place])   . Diverticulosis of colon   . DJD (degenerative joint disease)   . Hyperlipidemia   . Hypertension   . Hypothyroidism   . Melanoma ([place]) 03/20/15   removed with Moh's surgery  . Myelodysplasia 02/04/2012  . Normochromic normocytic anemia 02/04/2012  . Onychomycosis   . Osteoporosis   . Rectal prolapse   . Shoulder pain   . Unstable gait     Assessment/Plan:    Principal Problem:   Femur  fracture, right (HCC) Active Problems:   Myelodysplastic syndrome (HCC)   Hypothyroidism   Hyperlipidemia   Essential hypertension   Vascular dementia without behavioral disturbance (HCC)   History of CVA (cerebrovascular accident)   CKD (chronic kidney disease) stage 3, GFR 30-59 ml/min (HCC)   Diastolic CHF ([place])  Estimated body mass index is 26.72 kg/m as calculated from the following:   Height as of this encounter: 4\' 11"  (1.499 m).   Weight as of this encounter: 60 kg.  DVT Prophylaxis - Aspirin Non-weight bearing to the RLE, with the right leg to be in knee immobilizer at all times until follow-up appt in 2 weeks.  Ordered physical therapy consult for assist with transfers yesterday. Ready for discharge from orthopedic standpoint, plan is for d/c to SNF. Follow-up in the office in 2 weeks with Dr. Wynelle Link with serial x-rays to assure proper healing of fracture.  Theresa Duty, PA-C Orthopedic Surgery 11/04/2018, 7:54 AM

## 2018-11-05 ENCOUNTER — Encounter: Payer: Self-pay | Admitting: Nurse Practitioner

## 2018-11-05 ENCOUNTER — Encounter: Payer: Self-pay | Admitting: Family Medicine

## 2018-11-05 ENCOUNTER — Non-Acute Institutional Stay: Payer: Medicare Other | Admitting: Nurse Practitioner

## 2018-11-05 ENCOUNTER — Non-Acute Institutional Stay (SKILLED_NURSING_FACILITY): Payer: Medicare Other | Admitting: Family Medicine

## 2018-11-05 DIAGNOSIS — I4891 Unspecified atrial fibrillation: Secondary | ICD-10-CM | POA: Diagnosis not present

## 2018-11-05 DIAGNOSIS — I10 Essential (primary) hypertension: Secondary | ICD-10-CM | POA: Diagnosis not present

## 2018-11-05 DIAGNOSIS — Y838 Other surgical procedures as the cause of abnormal reaction of the patient, or of later complication, without mention of misadventure at the time of the procedure: Secondary | ICD-10-CM | POA: Diagnosis not present

## 2018-11-05 DIAGNOSIS — F0150 Vascular dementia without behavioral disturbance: Secondary | ICD-10-CM

## 2018-11-05 DIAGNOSIS — N183 Chronic kidney disease, stage 3 unspecified: Secondary | ICD-10-CM

## 2018-11-05 DIAGNOSIS — I129 Hypertensive chronic kidney disease with stage 1 through stage 4 chronic kidney disease, or unspecified chronic kidney disease: Secondary | ICD-10-CM | POA: Diagnosis not present

## 2018-11-05 DIAGNOSIS — K6289 Other specified diseases of anus and rectum: Secondary | ICD-10-CM | POA: Diagnosis not present

## 2018-11-05 DIAGNOSIS — S72401P Unspecified fracture of lower end of right femur, subsequent encounter for closed fracture with malunion: Secondary | ICD-10-CM

## 2018-11-05 DIAGNOSIS — M810 Age-related osteoporosis without current pathological fracture: Secondary | ICD-10-CM

## 2018-11-05 DIAGNOSIS — Z515 Encounter for palliative care: Secondary | ICD-10-CM

## 2018-11-05 DIAGNOSIS — T8131XA Disruption of external operation (surgical) wound, not elsewhere classified, initial encounter: Secondary | ICD-10-CM | POA: Diagnosis not present

## 2018-11-05 LAB — TYPE AND SCREEN
ABO/RH(D): O POS
Antibody Screen: NEGATIVE
UNIT DIVISION: 0
UNIT DIVISION: 0
Unit division: 0

## 2018-11-05 LAB — BASIC METABOLIC PANEL
BUN: 26 — AB (ref 4–21)
CREATININE: 1 (ref 0.5–1.1)
Glucose: 83
Potassium: 4.4 (ref 3.4–5.3)
SODIUM: 141 (ref 137–147)

## 2018-11-05 LAB — BPAM RBC
BLOOD PRODUCT EXPIRATION DATE: 201912082359
Blood Product Expiration Date: 201912092359
Blood Product Expiration Date: 201912092359
ISSUE DATE / TIME: 201911141705
ISSUE DATE / TIME: 201911181751
ISSUE DATE / TIME: 201911181101
UNIT TYPE AND RH: 5100
Unit Type and Rh: 5100
Unit Type and Rh: 5100

## 2018-11-05 LAB — CBC AND DIFFERENTIAL
HCT: 33 — AB (ref 36–46)
Hemoglobin: 10.9 — AB (ref 12.0–16.0)
Platelets: 194 (ref 150–399)
WBC: 7.7

## 2018-11-05 LAB — URINE CULTURE: Culture: >=100000 — AB

## 2018-11-05 NOTE — Progress Notes (Signed)
Provider:  Alain Honey, MD Location:  [facility] Room Number: 26A Place of Service:  SNF (31)  PCP: System, Pcp Not In Patient Care Team: System, Pcp Not In as PCP - General Milus Banister, MD as Consulting Physician (Gastroenterology) Heath Lark, MD as Consulting Physician (Hematology and Oncology) Mast, Man X, NP as Nurse Practitioner (Internal Medicine)  Extended Emergency Contact Information Primary Emergency Contact: [facility] Address: [place], [place] [address] [name] of [place] Phone: [phone] Relation: Son Secondary Emergency Contact: [name],[name] Address: [place] [address] [name] of [place] Phone: [phone] Relation: Daughter  Code Status: DNR Goals of Care: Advanced Directive information Advanced Directives 11/05/2018  Does Patient Have a Medical Advance Directive? Yes  Type of Paramedic of [place];Living will;Out of facility DNR (pink MOST or yellow form)  Does patient want to make changes to medical advance directive? No - Patient declined  Copy of [place] in Chart? Yes - validated most recent copy scanned in chart (See row information)  Would patient like information on creating a medical advance directive? -  Pre-existing out of facility DNR order (yellow form or pink MOST form) Yellow form placed in chart (order not valid for inpatient use)      Chief Complaint  Patient presents with  . Readmit To SNF    Readmit to SNF    HPI: Patient is a 84 y.o. female seen [facility].  She had been hospitalized from 11 16-11 24 care of a fe on the floor.  She was taken to the hospital where she was found to have a nondisplaced Ral fracture sustained in a fall at the nursing home.  Patient has dementia and is not really aware of safety concerns.  We  had recently met with family to discuss her care including possibility of falls.  The consensus was not if she would fall but when. She was found in her bathroom.  She was taken to the hospital where she was found to have a displaced right femoral fracture.  Per family's wishes this was treated nonoperatively and she was placed in a knee immobilizer and will follow up with orthopedics. Other relevant and pertinent issues are a sacral decubitus that is healing.  This prompted her last hospitalization where she was found to be septic from related abscess Other problems include hypertension vascular dementia, history of CVA, chronic kidney disease stage III  Past Medical History:  Diagnosis Date  . Blood transfusion 2006  . Colon polyps   . CVA (cerebral infarction) 2013  . Dementia ([place])   . Diverticulosis of colon   . DJD (degenerative joint disease)   . Hyperlipidemia   . Hypertension   . Hypothyroidism   . Melanoma ([place]) 03/20/15   removed with Moh's surgery  . Myelodysplasia 02/04/2012  . Normochromic normocytic anemia 02/04/2012  . Onychomycosis   . Osteoporosis   . Rectal prolapse   . Shoulder pain   . Unstable gait    Past Surgical History:  Procedure Laterality Date  . APPENDECTOMY    . CARPAL TUNNEL RELEASE Bilateral 2006  . FLEXIBLE SIGMOIDOSCOPY  07/09/2012   Procedure: FLEXIBLE SIGMOIDOSCOPY;  Surgeon: Inda Castle, MD;  Location: [facility];  Service: Endoscopy;  Laterality: N/A;  .  INCISION AND DRAINAGE PERIRECTAL ABSCESS N/A 09/21/2018   Procedure: IRRIGATION AND DEBRIDEMENT PERIRECTAL ABSCESS;  Surgeon: Rolm Bookbinder, MD;  Location: [facility];  Service: General;  Laterality: N/A;  . MELANOMA EXCISION Right 2005   arm  . POLYPECTOMY  2004   Laparoscopic  . RECTOCELE REPAIR    . REPLACEMENT TOTAL KNEE  2005 & 2006  . VAGINAL HYSTERECTOMY  1989    reports that she has never smoked. She has never used smokeless tobacco. She reports that she does not drink alcohol  or use drugs. Social History   Socioeconomic History  . Marital status: Married    Spouse name: Not on file  . Number of children: Not on file  . Years of education: Not on file  . Highest education level: Not on file  Occupational History  . Occupation: English as a second language teacher  Social Needs  . Financial resource strain: Not hard at all  . Food insecurity:    Worry: Never true    Inability: Never true  . Transportation needs:    Medical: No    Non-medical: No  Tobacco Use  . Smoking status: Never Smoker  . Smokeless tobacco: Never Used  Substance and Sexual Activity  . Alcohol use: No  . Drug use: No  . Sexual activity: Yes    Birth control/protection: Surgical    Comment: HYST  Lifestyle  . Physical activity:    Days per week: 0 days    Minutes per session: 0 min  . Stress: Not at all  Relationships  . Social connections:    Talks on phone: More than three times a week    Gets together: More than three times a week    Attends religious service: Never    Active member of club or organization: No    Attends meetings of clubs or organizations: Never    Relationship status: Married  . Intimate partner violence:    Fear of current or ex partner: No    Emotionally abused: No    Physically abused: No    Forced sexual activity: No  Other Topics Concern  . Not on file  Social History Narrative   [facility] at [facility], moved to [place] 12/28/14   married   Never smoked   Alcohol none   Caffeine Coffee 1 cup   Exercise none    Functional Status Survey:    Family History  Problem Relation Age of Onset  . Prostate cancer Brother   . Cancer Brother        renal cell carcinoma  . Heart disease Brother   . Breast cancer Mother 45  . Cancer Father        unknown kind  . Arthritis Daughter     Health Maintenance  Topic Date Due  . OPHTHALMOLOGY EXAM  11/15/2014  . FOOT EXAM  11/30/2014  . URINE MICROALBUMIN  11/30/2014  . HEMOGLOBIN A1C  10/11/2015  .  TETANUS/TDAP  04/22/2021  . INFLUENZA VACCINE  Completed  . DEXA SCAN  Completed  . PNA vac Low Risk Adult  Completed    Allergies  Allergen Reactions  . Lisinopril     REACTION: angioedema  . Penicillins     REACTION: rash Has patient had a PCN reaction causing immediate rash, facial/tongue/throat swelling, SOB or lightheadedness with hypotension: Unknown Has patient had a PCN reaction causing severe rash involving mucus membranes or skin necrosis: Unknown Has patient had a PCN reaction that required hospitalization: Unknown Has patient  had a PCN reaction occurring within the last 10 years: Unknown If all of the above answers are "NO", then may proceed with Cephalosporin use.    Outpatient Encounter Medications as of 11/05/2018  Medication Sig  . acetaminophen (TYLENOL) 500 MG tablet Take 500 mg by mouth every 8 (eight) hours as needed for mild pain.   Marland Kitchen aspirin EC 81 MG tablet Take 81 mg by mouth daily.  . cephALEXin (KEFLEX) 500 MG capsule Take 1 capsule (500 mg total) by mouth 2 (two) times daily for 5 days.  . cycloSPORINE (RESTASIS) 0.05 % ophthalmic emulsion Place 1 drop into both eyes 2 (two) times daily.    Marland Kitchen donepezil (ARICEPT) 10 MG tablet Take 10 mg by mouth at bedtime.  . hydroxypropyl methylcellulose / hypromellose (ISOPTO TEARS / GONIOVISC) 2.5 % ophthalmic solution Place 1 drop into both eyes every 2 (two) hours as needed for dry eyes.  Marland Kitchen levothyroxine (SYNTHROID, LEVOTHROID) 88 MCG tablet Take 1 tablet (88 mcg total) by mouth daily before breakfast.  . Metoprolol Tartrate 37.5 MG TABS Take 37.5 mg by mouth 2 (two) times daily.  Marland Kitchen NUTRITIONAL SUPPLEMENT LIQD Take 1 Magic Cup by mouth at dinner  . Nutritional Supplements (RESOURCE PO) Take 90 mLs by mouth 3 (three) times daily.  Marland Kitchen oxyCODONE (ROXICODONE) 5 MG immediate release tablet Take 0.5 tablets (2.5 mg total) by mouth every 8 (eight) hours as needed for up to 5 days.  . OXYGEN Inhale 2 L into the lungs continuous.  To keep sat above 90%  . polyethylene glycol powder (GLYCOLAX/MIRALAX) powder Take 17 g by mouth daily as needed for mild constipation.   . saccharomyces boulardii (FLORASTOR) 250 MG capsule Take 250 mg by mouth 2 (two) times daily.  . Sodium Fluoride (PREVIDENT 5000 BOOSTER PLUS) 1.1 % PSTE Place 1 application onto teeth daily.   Marland Kitchen zinc oxide 20 % ointment Apply Zinc Oxide to buttocks topically after every incontinent episode and as needed. May keep at bedside   No facility-administered encounter medications on file as of 11/05/2018.     Review of Systems  Constitutional: Positive for activity change.  HENT: Positive for hearing loss.   Eyes: Negative.   Respiratory: Negative.   Cardiovascular: Negative.   Gastrointestinal: Negative.   Genitourinary: Positive for frequency.  Neurological: Positive for weakness.  Psychiatric/Behavioral: Positive for confusion.    Vitals:   11/05/18 1306  BP: 139/82  Pulse: 85  Resp: 20  Temp: 97.6 F (36.4 C)  TempSrc: Oral  SpO2: 96%  Weight: 120 lb (54.4 kg)  Height: _0  (1.499 m)   Body mass index is 24.24 kg/m. Physical Exam  Constitutional: She appears well-developed and well-nourished.  HENT:  Head: Normocephalic.  Mouth/Throat: Oropharynx is clear and moist.  Eyes: Pupils are equal, round, and reactive to light.  Neck: Normal range of motion. Neck supple.  Cardiovascular: Normal rate and regular rhythm.  Pulmonary/Chest: Effort normal and breath sounds normal.  Abdominal: Soft. Bowel sounds are normal.  Musculoskeletal:  Has a right knee immobilizer in place  Neurological: She is alert.  Oriented to person and place  Skin: Skin is warm.  Sacral wound is reported to be slowly healing.  Calcium alginate is still used and dressing changes and she was seen at wound care center this morning and will follow up there in 3 weeks  Psychiatric: She has a normal mood and affect.  Patient has dementia and poor safety awareness    Nursing note and vitals  reviewed.   Labs reviewed: Basic Metabolic Panel: Recent Labs    09/23/18 0443 09/24/18 0330 09/25/18 0539  11/02/18 0428 11/03/18 0500 11/04/18 0529  NA 141 142 141   < > 142 139 138  K 4.6 4.7 4.6   < > 4.6 4.0 3.8  CL 111 109 105   < > 114* 113* 110  CO2 _0 < > 20* 19* 20*  GLUCOSE 103* 101* 89   < > 98 96 94  BUN 25* 19 14   < > 35* 30* 29*  CREATININE 0.89 0.87 0.86   < > 1.07* 0.97 0.84  CALCIUM 8.6* 8.7* 8.8*   < > 8.4* 8.3* 8.5*  MG 1.9 1.5* 1.7  --   --   --   --    < > = values in this interval not displayed.   Liver Function Tests: Recent Labs    09/17/18 0445 09/20/18 0706 09/21/18 0645  AST 43* 17 20  ALT 61* 31 25  ALKPHOS 97 72 64  BILITOT 1.0 1.1 0.6  PROT 5.2* 4.9* 5.2*  ALBUMIN 2.3* 2.2* 2.2*   No results for input(s): LIPASE, AMYLASE in the last 8760 hours. No results for input(s): AMMONIA in the last 8760 hours. CBC: Recent Labs    11/02/18 0428 11/02/18 2336 11/03/18 0500 11/04/18 0529  WBC 13.4*  --  10.8* 9.8  NEUTROABS 9.7*  --  7.4 6.3  HGB 7.2* 9.8* 10.2* 10.8*  HCT 23.6* 30.5* 31.9* 33.0*  MCV 90.8  --  85.8 84.8  PLT 375  --  297 309   Cardiac Enzymes: No results for input(s): CKTOTAL, CKMB, CKMBINDEX, TROPONINI in the last 8760 hours. BNP: Invalid input(s): POCBNP Lab Results  Component Value Date   HGBA1C 5.6 04/11/2015   Lab Results  Component Value Date   TSH 1.88 08/11/2018   Lab Results  Component Value Date   VITAMINB12 2,000 08/11/2018   Lab Results  Component Value Date   FOLATE >20.0 02/11/2007   Lab Results  Component Value Date   IRON 26 08/04/2018   TIBC 288 08/09/2014   FERRITIN 946 08/11/2018    Imaging and Procedures obtained prior to SNF admission: Dg Elbow Complete Right  Result Date: 10/31/2018 CLINICAL DATA:  Lateral right elbow pain and abrasions following a fall today. EXAM: RIGHT ELBOW - COMPLETE 3+ VIEW COMPARISON:  None. FINDINGS: There is no  evidence of fracture, dislocation, or joint effusion. There is no evidence of arthropathy or other focal bone abnormality. Soft tissues are unremarkable. IMPRESSION: No fracture or dislocation. Electronically Signed   By: Claudie Revering M.D.   On: 10/31/2018 15:20   Dg Chest Port 1 View  Result Date: 10/31/2018 CLINICAL DATA:  Fall, hypertension EXAM: PORTABLE CHEST 1 VIEW COMPARISON:  09/16/2018 FINDINGS: Cardiomegaly. Lungs clear. No effusions. No overt edema. No acute bony abnormality. Scoliosis and degenerative changes in the thoracic spine. IMPRESSION: Cardiomegaly.  No active disease. Electronically Signed   By: Rolm Baptise M.D.   On: 10/31/2018 16:49   Dg Shoulder Right Portable  Result Date: 10/31/2018 CLINICAL DATA:  Fall, right shoulder pain EXAM: PORTABLE RIGHT SHOULDER COMPARISON:  None. FINDINGS: No fracture or dislocation is seen. Moderate to severe degenerative changes with chronic rotator cuff tear. Visualized soft tissues are within normal limits. Visualized right lung is clear. IMPRESSION: No fracture or dislocation is seen. Moderate to severe degenerative changes. Electronically Signed   By: Henderson Newcomer.D.  On: 10/31/2018 15:21   Dg Hip Unilat W Or Wo Pelvis 2-3 Views Right  Result Date: 10/31/2018 CLINICAL DATA:  Fall, right femur pain EXAM: DG HIP (WITH OR WITHOUT PELVIS) 2-3V RIGHT COMPARISON:  None. FINDINGS: No evidence of fracture dislocation of the right hip. Visualized bony pelvis appears intact. Bilateral hip joint spaces are symmetric. Distal femoral shaft fracture, described on dedicated femur radiographs. IMPRESSION: No evidence of fracture dislocation of the right hip. Distal femoral shaft fracture, described on dedicated femur radiographs. Electronically Signed   By: Julian Hy M.D.   On: 10/31/2018 15:24   Dg Femur Min 2 Views Right  Result Date: 10/31/2018 CLINICAL DATA:  Fall, right femur pain EXAM: RIGHT FEMUR 2 VIEWS COMPARISON:  None.  FINDINGS: Displaced, angulated right mid/distal femoral shaft fracture. Apex dorsal angulation is suspected, although exact orientation is difficult due to difficulty with patient positioning. Right knee arthroplasty. IMPRESSION: Displaced, angulated right mid/distal femoral shaft fracture. Electronically Signed   By: Julian Hy M.D.   On: 10/31/2018 15:26   Dg Femur Port, Min 2 Views Right  Result Date: 10/31/2018 CLINICAL DATA:  Status post traction and knee immobilization for known right distal femoral fracture. Initial encounter. EXAM: RIGHT FEMUR PORTABLE 2 VIEW COMPARISON:  None. FINDINGS: Status post traction and immobilization of the right femur, there is resolution of previously noted angulation at the distal femoral fracture, though approximately 9 cm of shortening is noted, with medial and anterior displacement of the distal femur, and mild rotation. The patient's total knee arthroplasty is grossly unremarkable in appearance. No new fractures are seen. The right femoral head remains seated at the acetabulum. IMPRESSION: Status post traction and immobilization of the right femur, there is resolution of previously noted angulation at the distal femoral fracture, though approximately 9 cm of shortening is noted, with medial and anterior displacement of the distal femur, and mild rotation. Electronically Signed   By: Garald Balding M.D.   On: 10/31/2018 22:04    Assessment/Plan 1. Essential hypertension Blood pressures well controlled on metoprolol  2. Vascular dementia without behavioral disturbance [facility]) Patient is essentially bedridden, although she does not appreciate this fact.  She does take Aricept for her dementia  3. CKD (chronic kidney disease) stage 3, GFR 30-59 ml/min (HCC) Creatinine is stable  4. Perirectal inflammation Wound is being followed by staff nurses as well as wound care center.  It is healing.  Staph species was recently cultured and she was treated with a  course of doxycycline.   Family/ staff Communication: Findings communicated to son who is in room at time of exam  Labs/tests ordered:  Lillette Boxer. [place], [place] [address] [place], [place] Office [phone]

## 2018-11-05 NOTE — Progress Notes (Signed)
PALLIATIVE CARE CONSULT VISIT   PATIENT NAME: Brittany [name] DOB: 04-Nov-1924 MRN: [id number]   [facility] Room 28  PRIMARY CARE PROVIDER:   System, Pcp Not In  REFERRING PROVIDER:  No referring provider defined for this encounter.   Dr. Blanchie Serve Internal medicine Dr. Dixie Dials (cardiology)  RESPONSIBLE PARTY:   Griffin Dakin [phone]   HISTORY OF PRESENT ILLNESS:  Brittany [name] is a 84 y.o. year old female with multiple medical problems including recent hospitalization for left displaced angulated femur fracture due to traumatic fall; (consertive treatment only);  sepsis 2/2 left gluteal cellulitis/abscess s/p I&D,  RLL PNA ; Diverticulitis ; atrial fibrillation   vascular dementia without behaviorial disturbance;  Palliative Care was asked to help with symptom management address goals of care.    Interim history: patient with two traumatic falls. Most recent fall on 11/16 patient sustained right femur fracture (to be managed conservatively; fall on 11/10 patient sustained right temporal laceration. Patient readmitted to [facility] . Since readmission staff and patient report that her pain is controlled with current regimen; oral intake is good.  ASSESSMENT/PLAN   -Right femur fracture s/p fall 11/16 -right temporal laceration s/p fall 11/10 -Pain due to fracture and CHI -per Orthopedic's recommendations with knee immobilizer; has f/u appointment in 2 weeks -non-weight bearing -ASA for DVT ppx -pain controlled with oxycodone  -E. Coli UTI - Cephalexin 500 mg BID  Vascular dementia without behavioral disturbance -oriented to self; staff note worsening of cognition since readmission -continue donepezil  HTN HLD Hypothyroidism Stage III CKD -metoprolol, cardizem and losartan -synthroid 28mcgs   left gluteal ulcer 2/2 cellulitis/abscess s/p I&D -Patient being treated and followed by [facility]  myelodysplastic syndrome pancytopenia -evaluated by Oncology -follow up  labs at SNF and F/U with Dr. Alvy Bimler OP  pAtrial fibrillaiton -followed bycardiology -continue metoprolol, and cardizem  Decreased oral intake -Son reports that patient was eating well in the hospital -will monitor weights closely with weekly weights; re-admission weight 120lb  ACP -Most Form completed today; DNR-limited; give antibiotic's and IVF's no feeding tubes    RECOMMENDATIONS  1. Weekly weights  I spent 60 minutes providing this consultation,  from 13:00 to 14:00. More than 50% of the time in this consultation was spent coordinating communication.   CODE STATUS: DNR/MOST done today; limited, give antibiotic's and IVF ;no feeding tube  PPS: 30% HOSPICE ELIGIBILITY/DIAGNOSIS: TBD  PAST MEDICAL HISTORY:  Past Medical History:  Diagnosis Date  . Blood transfusion 2006  . Colon polyps   . CVA (cerebral infarction) 2013  . Dementia ([place])   . Diverticulosis of colon   . DJD (degenerative joint disease)   . Hyperlipidemia   . Hypertension   . Hypothyroidism   . Melanoma ([place]) 03/20/15   removed with Moh's surgery  . Myelodysplasia 02/04/2012  . Normochromic normocytic anemia 02/04/2012  . Onychomycosis   . Osteoporosis   . Rectal prolapse   . Shoulder pain   . Unstable gait     SOCIAL HX:  Social History   Tobacco Use  . Smoking status: Never Smoker  . Smokeless tobacco: Never Used  Substance Use Topics  . Alcohol use: No    ALLERGIES:  Allergies  Allergen Reactions  . Lisinopril     REACTION: angioedema  . Penicillins     REACTION: rash Has patient had a PCN reaction causing immediate rash, facial/tongue/throat swelling, SOB or lightheadedness with hypotension: Unknown Has patient had a PCN reaction causing severe rash  involving mucus membranes or skin necrosis: Unknown Has patient had a PCN reaction that required hospitalization: Unknown Has patient had a PCN reaction occurring within the last 10 years: Unknown If all of the above answers are "NO", then may  proceed with Cephalosporin use.     PERTINENT MEDICATIONS:  Outpatient Encounter Medications as of 11/05/2018  Medication Sig  . acetaminophen (TYLENOL) 500 MG tablet Take 500 mg by mouth every 8 (eight) hours as needed for mild pain.   Marland Kitchen aspirin EC 81 MG tablet Take 81 mg by mouth daily.  . cephALEXin (KEFLEX) 500 MG capsule Take 1 capsule (500 mg total) by mouth 2 (two) times daily for 5 days.  . cycloSPORINE (RESTASIS) 0.05 % ophthalmic emulsion Place 1 drop into both eyes 2 (two) times daily.    Marland Kitchen donepezil (ARICEPT) 10 MG tablet Take 10 mg by mouth at bedtime.  . hydroxypropyl methylcellulose / hypromellose (ISOPTO TEARS / GONIOVISC) 2.5 % ophthalmic solution Place 1 drop into both eyes every 2 (two) hours as needed for dry eyes.  Marland Kitchen levothyroxine (SYNTHROID, LEVOTHROID) 88 MCG tablet Take 1 tablet (88 mcg total) by mouth daily before breakfast.  . Metoprolol Tartrate 37.5 MG TABS Take 37.5 mg by mouth 2 (two) times daily.  Marland Kitchen NUTRITIONAL SUPPLEMENT LIQD Take 1 Magic Cup by mouth at dinner  . Nutritional Supplements (RESOURCE PO) Take 90 mLs by mouth 3 (three) times daily.  Marland Kitchen oxyCODONE (ROXICODONE) 5 MG immediate release tablet Take 0.5 tablets (2.5 mg total) by mouth every 8 (eight) hours as needed for up to 5 days.  . OXYGEN Inhale 2 L into the lungs continuous. To keep sat above 90%  . polyethylene glycol powder (GLYCOLAX/MIRALAX) powder Take 17 g by mouth daily as needed for mild constipation.   . saccharomyces boulardii (FLORASTOR) 250 MG capsule Take 250 mg by mouth 2 (two) times daily.  . Sodium Fluoride (PREVIDENT 5000 BOOSTER PLUS) 1.1 % PSTE Place 1 application onto teeth daily.   Marland Kitchen zinc oxide 20 % ointment Apply Zinc Oxide to buttocks topically after every incontinent episode and as needed. May keep at bedside   No facility-administered encounter medications on file as of 11/05/2018.     PHYSICAL EXAM:   General: NAD, frail appearing, thin Cardiovascular: regular rate and  rhythm Pulmonary: BBA CTA Abdomen: soft, nontender, + bowel sounds GU: no suprapubic tenderness Extremities: no edema,  Skin: no rashes Neurological: Weakness but otherwise nonfocal   G [place], NP

## 2018-11-09 ENCOUNTER — Inpatient Hospital Stay (HOSPITAL_COMMUNITY)
Admission: EM | Admit: 2018-11-09 | Discharge: 2018-11-13 | DRG: 481 | Disposition: A | Discharge status: Skilled Nursing Facility | Payer: Medicare Other | Attending: Internal Medicine | Admitting: Internal Medicine

## 2018-11-09 ENCOUNTER — Inpatient Hospital Stay (HOSPITAL_COMMUNITY): Payer: Medicare Other

## 2018-11-09 ENCOUNTER — Emergency Department (HOSPITAL_COMMUNITY): Payer: Medicare Other

## 2018-11-09 ENCOUNTER — Non-Acute Institutional Stay (SKILLED_NURSING_FACILITY): Payer: Medicare Other | Admitting: Family

## 2018-11-09 ENCOUNTER — Inpatient Hospital Stay (HOSPITAL_COMMUNITY): Payer: Medicare Other | Admitting: Certified Registered Nurse Anesthetist

## 2018-11-09 ENCOUNTER — Encounter: Payer: Self-pay | Admitting: Family

## 2018-11-09 ENCOUNTER — Encounter (HOSPITAL_COMMUNITY)
Admission: EM | Disposition: A | Discharge status: Skilled Nursing Facility | Payer: Self-pay | Source: Home / Self Care | Attending: Internal Medicine

## 2018-11-09 ENCOUNTER — Encounter (HOSPITAL_COMMUNITY): Payer: Self-pay

## 2018-11-09 DIAGNOSIS — M25561 Pain in right knee: Secondary | ICD-10-CM | POA: Diagnosis not present

## 2018-11-09 DIAGNOSIS — Z7982 Long term (current) use of aspirin: Secondary | ICD-10-CM | POA: Diagnosis not present

## 2018-11-09 DIAGNOSIS — D72829 Elevated white blood cell count, unspecified: Secondary | ICD-10-CM

## 2018-11-09 DIAGNOSIS — S81811A Laceration without foreign body, right lower leg, initial encounter: Secondary | ICD-10-CM

## 2018-11-09 DIAGNOSIS — N390 Urinary tract infection, site not specified: Secondary | ICD-10-CM

## 2018-11-09 DIAGNOSIS — Z8719 Personal history of other diseases of the digestive system: Secondary | ICD-10-CM | POA: Diagnosis not present

## 2018-11-09 DIAGNOSIS — M9711XA Periprosthetic fracture around internal prosthetic right knee joint, initial encounter: Secondary | ICD-10-CM | POA: Diagnosis present

## 2018-11-09 DIAGNOSIS — Z23 Encounter for immunization: Secondary | ICD-10-CM | POA: Diagnosis present

## 2018-11-09 DIAGNOSIS — I130 Hypertensive heart and chronic kidney disease with heart failure and stage 1 through stage 4 chronic kidney disease, or unspecified chronic kidney disease: Secondary | ICD-10-CM | POA: Diagnosis present

## 2018-11-09 DIAGNOSIS — Z8673 Personal history of transient ischemic attack (TIA), and cerebral infarction without residual deficits: Secondary | ICD-10-CM | POA: Diagnosis not present

## 2018-11-09 DIAGNOSIS — N183 Chronic kidney disease, stage 3 (moderate): Secondary | ICD-10-CM | POA: Diagnosis present

## 2018-11-09 DIAGNOSIS — D649 Anemia, unspecified: Secondary | ICD-10-CM | POA: Diagnosis not present

## 2018-11-09 DIAGNOSIS — Z66 Do not resuscitate: Secondary | ICD-10-CM | POA: Diagnosis present

## 2018-11-09 DIAGNOSIS — I10 Essential (primary) hypertension: Secondary | ICD-10-CM | POA: Diagnosis present

## 2018-11-09 DIAGNOSIS — Z4801 Encounter for change or removal of surgical wound dressing: Secondary | ICD-10-CM

## 2018-11-09 DIAGNOSIS — S72409B Unspecified fracture of lower end of unspecified femur, initial encounter for open fracture type I or II: Secondary | ICD-10-CM

## 2018-11-09 DIAGNOSIS — Z8261 Family history of arthritis: Secondary | ICD-10-CM

## 2018-11-09 DIAGNOSIS — Z9181 History of falling: Secondary | ICD-10-CM

## 2018-11-09 DIAGNOSIS — E039 Hypothyroidism, unspecified: Secondary | ICD-10-CM | POA: Diagnosis present

## 2018-11-09 DIAGNOSIS — M25551 Pain in right hip: Secondary | ICD-10-CM

## 2018-11-09 DIAGNOSIS — I480 Paroxysmal atrial fibrillation: Secondary | ICD-10-CM | POA: Diagnosis present

## 2018-11-09 DIAGNOSIS — D62 Acute posthemorrhagic anemia: Secondary | ICD-10-CM | POA: Diagnosis not present

## 2018-11-09 DIAGNOSIS — Z9049 Acquired absence of other specified parts of digestive tract: Secondary | ICD-10-CM | POA: Diagnosis not present

## 2018-11-09 DIAGNOSIS — Z803 Family history of malignant neoplasm of breast: Secondary | ICD-10-CM

## 2018-11-09 DIAGNOSIS — B9620 Unspecified Escherichia coli [E. coli] as the cause of diseases classified elsewhere: Secondary | ICD-10-CM | POA: Diagnosis not present

## 2018-11-09 DIAGNOSIS — M810 Age-related osteoporosis without current pathological fracture: Secondary | ICD-10-CM | POA: Diagnosis present

## 2018-11-09 DIAGNOSIS — Z1612 Extended spectrum beta lactamase (ESBL) resistance: Secondary | ICD-10-CM

## 2018-11-09 DIAGNOSIS — Z419 Encounter for procedure for purposes other than remedying health state, unspecified: Secondary | ICD-10-CM

## 2018-11-09 DIAGNOSIS — S7291XB Unspecified fracture of right femur, initial encounter for open fracture type I or II: Secondary | ICD-10-CM | POA: Diagnosis present

## 2018-11-09 DIAGNOSIS — D469 Myelodysplastic syndrome, unspecified: Secondary | ICD-10-CM | POA: Diagnosis present

## 2018-11-09 DIAGNOSIS — Z888 Allergy status to other drugs, medicaments and biological substances status: Secondary | ICD-10-CM

## 2018-11-09 DIAGNOSIS — Z85820 Personal history of malignant melanoma of skin: Secondary | ICD-10-CM

## 2018-11-09 DIAGNOSIS — Z79890 Hormone replacement therapy (postmenopausal): Secondary | ICD-10-CM

## 2018-11-09 DIAGNOSIS — S7291XE Unspecified fracture of right femur, subsequent encounter for open fracture type I or II with routine healing: Secondary | ICD-10-CM | POA: Diagnosis not present

## 2018-11-09 DIAGNOSIS — E875 Hyperkalemia: Secondary | ICD-10-CM | POA: Diagnosis not present

## 2018-11-09 DIAGNOSIS — I639 Cerebral infarction, unspecified: Secondary | ICD-10-CM | POA: Diagnosis present

## 2018-11-09 DIAGNOSIS — S72331B Displaced oblique fracture of shaft of right femur, initial encounter for open fracture type I or II: Principal | ICD-10-CM | POA: Diagnosis present

## 2018-11-09 DIAGNOSIS — E785 Hyperlipidemia, unspecified: Secondary | ICD-10-CM | POA: Diagnosis present

## 2018-11-09 DIAGNOSIS — F0150 Vascular dementia without behavioral disturbance: Secondary | ICD-10-CM | POA: Diagnosis present

## 2018-11-09 DIAGNOSIS — Z880 Allergy status to penicillin: Secondary | ICD-10-CM

## 2018-11-09 DIAGNOSIS — Z79899 Other long term (current) drug therapy: Secondary | ICD-10-CM

## 2018-11-09 DIAGNOSIS — Z9981 Dependence on supplemental oxygen: Secondary | ICD-10-CM | POA: Diagnosis not present

## 2018-11-09 DIAGNOSIS — Z90710 Acquired absence of both cervix and uterus: Secondary | ICD-10-CM

## 2018-11-09 DIAGNOSIS — Z8042 Family history of malignant neoplasm of prostate: Secondary | ICD-10-CM

## 2018-11-09 DIAGNOSIS — Z8051 Family history of malignant neoplasm of kidney: Secondary | ICD-10-CM

## 2018-11-09 DIAGNOSIS — Z8249 Family history of ischemic heart disease and other diseases of the circulatory system: Secondary | ICD-10-CM

## 2018-11-09 DIAGNOSIS — S72491B Other fracture of lower end of right femur, initial encounter for open fracture type I or II: Secondary | ICD-10-CM | POA: Diagnosis not present

## 2018-11-09 HISTORY — PX: FEMUR IM NAIL: SHX1597

## 2018-11-09 LAB — BASIC METABOLIC PANEL
ANION GAP: 7 (ref 5–15)
BUN: 32 mg/dL — ABNORMAL HIGH (ref 8–23)
CO2: 25 mmol/L (ref 22–32)
CREATININE: 1.01 mg/dL — AB (ref 0.44–1.00)
Calcium: 9.3 mg/dL (ref 8.9–10.3)
Chloride: 108 mmol/L (ref 98–111)
GFR, EST AFRICAN AMERICAN: 54 mL/min — AB (ref >60)
GFR, EST NON AFRICAN AMERICAN: 47 mL/min — AB (ref >60)
Glucose, Bld: 105 mg/dL — ABNORMAL HIGH (ref 70–99)
Potassium: 4.8 mmol/L (ref 3.5–5.1)
SODIUM: 140 mmol/L (ref 135–145)

## 2018-11-09 LAB — CBC WITH DIFFERENTIAL/PLATELET
Abs Immature Granulocytes: 0.26 10*3/uL — ABNORMAL HIGH (ref 0.00–0.07)
BASOS PCT: 0 %
Basophils Absolute: 0.1 10*3/uL (ref 0.0–0.1)
EOS ABS: 0.4 10*3/uL (ref 0.0–0.5)
Eosinophils Relative: 3 %
HCT: 34.1 % — ABNORMAL LOW (ref 36.0–46.0)
HEMOGLOBIN: 10.3 g/dL — AB (ref 12.0–15.0)
Immature Granulocytes: 2 %
Lymphocytes Relative: 15 %
Lymphs Abs: 1.9 10*3/uL (ref 0.7–4.0)
MCH: 27.5 pg (ref 26.0–34.0)
MCHC: 30.2 g/dL (ref 30.0–36.0)
MCV: 91.2 fL (ref 80.0–100.0)
Monocytes Absolute: 1.5 10*3/uL — ABNORMAL HIGH (ref 0.1–1.0)
Monocytes Relative: 12 %
NEUTROS PCT: 68 %
NRBC: 0 % (ref 0.0–0.2)
Neutro Abs: 8.5 10*3/uL — ABNORMAL HIGH (ref 1.7–7.7)
Platelets: 283 10*3/uL (ref 150–400)
RBC: 3.74 MIL/uL — AB (ref 3.87–5.11)
RDW: 17.7 % — AB (ref 11.5–15.5)
WBC: 12.5 10*3/uL — AB (ref 4.0–10.5)

## 2018-11-09 LAB — PROTIME-INR
INR: 1
PROTHROMBIN TIME: 13.1 s (ref 11.4–15.2)

## 2018-11-09 SURGERY — INSERTION, INTRAMEDULLARY ROD, FEMUR, RETROGRADE
Anesthesia: General | Laterality: Right

## 2018-11-09 MED ORDER — SUCCINYLCHOLINE CHLORIDE 200 MG/10ML IV SOSY
PREFILLED_SYRINGE | INTRAVENOUS | Status: AC
  Filled 2018-11-09: qty 10

## 2018-11-09 MED ORDER — DOXYCYCLINE HYCLATE 100 MG PO TABS
100.0000 mg | ORAL_TABLET | Freq: Once | ORAL | Status: AC
  Administered 2018-11-09: 100 mg via ORAL
  Filled 2018-11-09: qty 1

## 2018-11-09 MED ORDER — DEXAMETHASONE SODIUM PHOSPHATE 10 MG/ML IJ SOLN
INTRAMUSCULAR | Status: DC | PRN
  Administered 2018-11-09: 10 mg via INTRAVENOUS

## 2018-11-09 MED ORDER — LIDOCAINE 2% (20 MG/ML) 5 ML SYRINGE
INTRAMUSCULAR | Status: DC | PRN
  Administered 2018-11-09: 40 mg via INTRAVENOUS

## 2018-11-09 MED ORDER — PHENYLEPHRINE HCL 10 MG/ML IJ SOLN
INTRAMUSCULAR | Status: AC
  Filled 2018-11-09: qty 1

## 2018-11-09 MED ORDER — ONDANSETRON HCL 4 MG/2ML IJ SOLN
INTRAMUSCULAR | Status: AC
  Filled 2018-11-09: qty 2

## 2018-11-09 MED ORDER — METHOCARBAMOL 500 MG IVPB - SIMPLE MED
INTRAVENOUS | Status: AC
  Filled 2018-11-09: qty 50

## 2018-11-09 MED ORDER — ONDANSETRON HCL 4 MG/2ML IJ SOLN
INTRAMUSCULAR | Status: DC | PRN
  Administered 2018-11-09: 4 mg via INTRAVENOUS

## 2018-11-09 MED ORDER — LACTATED RINGERS IV SOLN
INTRAVENOUS | Status: DC | PRN
  Administered 2018-11-09: 20:00:00 via INTRAVENOUS

## 2018-11-09 MED ORDER — PROPOFOL 10 MG/ML IV BOLUS
INTRAVENOUS | Status: AC
  Filled 2018-11-09: qty 20

## 2018-11-09 MED ORDER — PROPOFOL 10 MG/ML IV BOLUS
INTRAVENOUS | Status: DC | PRN
  Administered 2018-11-09: 70 mg via INTRAVENOUS

## 2018-11-09 MED ORDER — FENTANYL CITRATE (PF) 100 MCG/2ML IJ SOLN
INTRAMUSCULAR | Status: AC
  Filled 2018-11-09: qty 2

## 2018-11-09 MED ORDER — CEFAZOLIN SODIUM-DEXTROSE 1-4 GM/50ML-% IV SOLN
1.0000 g | Freq: Once | INTRAVENOUS | Status: AC
  Administered 2018-11-09: 1 g via INTRAVENOUS
  Filled 2018-11-09: qty 50

## 2018-11-09 MED ORDER — OXYCODONE HCL 5 MG PO TABS
2.5000 mg | ORAL_TABLET | Freq: Once | ORAL | Status: AC
  Administered 2018-11-09: 2.5 mg via ORAL
  Filled 2018-11-09: qty 1

## 2018-11-09 MED ORDER — VANCOMYCIN HCL IN DEXTROSE 1-5 GM/200ML-% IV SOLN
1000.0000 mg | INTRAVENOUS | Status: AC
  Administered 2018-11-09: 1000 mg via INTRAVENOUS

## 2018-11-09 MED ORDER — EPHEDRINE 5 MG/ML INJ
INTRAVENOUS | Status: AC
  Filled 2018-11-09: qty 10

## 2018-11-09 MED ORDER — FENTANYL CITRATE (PF) 100 MCG/2ML IJ SOLN
INTRAMUSCULAR | Status: DC | PRN
  Administered 2018-11-09: 100 ug via INTRAVENOUS

## 2018-11-09 MED ORDER — VANCOMYCIN HCL IN DEXTROSE 750-5 MG/150ML-% IV SOLN: 750.0000 mg | INTRAVENOUS | Status: DC

## 2018-11-09 MED ORDER — SUCCINYLCHOLINE CHLORIDE 200 MG/10ML IV SOSY
PREFILLED_SYRINGE | INTRAVENOUS | Status: DC | PRN
  Administered 2018-11-09: 80 mg via INTRAVENOUS

## 2018-11-09 MED ORDER — ROCURONIUM BROMIDE 100 MG/10ML IV SOLN
INTRAVENOUS | Status: AC
  Filled 2018-11-09: qty 1

## 2018-11-09 MED ORDER — EPHEDRINE SULFATE-NACL 50-0.9 MG/10ML-% IV SOSY
PREFILLED_SYRINGE | INTRAVENOUS | Status: DC | PRN
  Administered 2018-11-09: 10 mg via INTRAVENOUS

## 2018-11-09 MED ORDER — VANCOMYCIN HCL IN DEXTROSE 1-5 GM/200ML-% IV SOLN
INTRAVENOUS | Status: AC
  Filled 2018-11-09: qty 200

## 2018-11-09 MED ORDER — LIDOCAINE 2% (20 MG/ML) 5 ML SYRINGE
INTRAMUSCULAR | Status: AC
  Filled 2018-11-09: qty 5

## 2018-11-09 MED ORDER — SODIUM CHLORIDE 0.9 % IV SOLN: 1.0000 g | INTRAVENOUS | Status: DC

## 2018-11-09 MED ORDER — SODIUM CHLORIDE 0.9 % IV SOLN
INTRAVENOUS | Status: DC | PRN
  Administered 2018-11-09: 20 ug/min via INTRAVENOUS

## 2018-11-09 MED ORDER — METHOCARBAMOL 500 MG IVPB - SIMPLE MED
500.0000 mg | Freq: Four times a day (QID) | INTRAVENOUS | Status: DC | PRN
  Administered 2018-11-09: 500 mg via INTRAVENOUS
  Filled 2018-11-09: qty 50

## 2018-11-09 MED ORDER — SODIUM CHLORIDE 0.9 % IV SOLN
2.0000 g | INTRAVENOUS | Status: AC
  Administered 2018-11-09: 2 g via INTRAVENOUS
  Filled 2018-11-09 (×2): qty 2

## 2018-11-09 MED ORDER — METHOCARBAMOL 500 MG PO TABS: 500.0000 mg | ORAL_TABLET | Freq: Four times a day (QID) | ORAL | Status: DC | PRN

## 2018-11-09 MED ORDER — DEXAMETHASONE SODIUM PHOSPHATE 10 MG/ML IJ SOLN
INTRAMUSCULAR | Status: AC
  Filled 2018-11-09: qty 1

## 2018-11-09 SURGICAL SUPPLY — 54 items
BAG ZIPLOCK 12X15 (MISCELLANEOUS) IMPLANT
BIT DRILL CALIBRATED 4.3MMX365 (DRILL) ×1 IMPLANT
BIT DRILL CROWE PNT TWST 4.5MM (DRILL) ×1 IMPLANT
BLADE SAW SGTL 11.0X1.19X90.0M (BLADE) IMPLANT
BLADE SURG 15 STRL LF DISP TIS (BLADE) IMPLANT
BLADE SURG 15 STRL SS (BLADE)
CABLE CERLAGE W/CRIMP 1.8 (Cable) ×8 IMPLANT
CABLE GTR COCR 1.8X635 (Orthopedic Implant) ×4 IMPLANT
COVER BACK TABLE 60X90IN (DRAPES) IMPLANT
COVER SURGICAL LIGHT HANDLE (MISCELLANEOUS) ×2 IMPLANT
COVER WAND RF STERILE (DRAPES) IMPLANT
DRAPE C-ARMOR (DRAPES) ×2 IMPLANT
DRAPE INCISE IOBAN 85X60 (DRAPES) ×2 IMPLANT
DRAPE ORTHO SPLIT 77X108 STRL (DRAPES) ×2
DRAPE STERI IOBAN 125X83 (DRAPES) ×2 IMPLANT
DRAPE SURG ORHT 6 SPLT 77X108 (DRAPES) ×2 IMPLANT
DRILL CALIBRATED 4.3MMX365 (DRILL) ×2
DRILL CROWE POINT TWIST 4.5MM (DRILL) ×2
DRSG PAD ABDOMINAL 8X10 ST (GAUZE/BANDAGES/DRESSINGS) IMPLANT
DURAPREP 26ML APPLICATOR (WOUND CARE) ×2 IMPLANT
ELECT PENCIL ROCKER SW 15FT (MISCELLANEOUS) IMPLANT
ELECT REM PT RETURN 15FT ADLT (MISCELLANEOUS) ×2 IMPLANT
GAUZE SPONGE 4X4 12PLY STRL (GAUZE/BANDAGES/DRESSINGS) IMPLANT
GLOVE BIOGEL M 7.0 STRL (GLOVE) IMPLANT
GLOVE BIOGEL PI IND STRL 7.5 (GLOVE) ×1 IMPLANT
GLOVE BIOGEL PI IND STRL 8.5 (GLOVE) ×1 IMPLANT
GLOVE BIOGEL PI INDICATOR 7.5 (GLOVE) ×1
GLOVE BIOGEL PI INDICATOR 8.5 (GLOVE) ×1
GLOVE ECLIPSE 8.0 STRL XLNG CF (GLOVE) IMPLANT
GLOVE ORTHO TXT STRL SZ7.5 (GLOVE) ×12 IMPLANT
GLOVE SURG ORTHO 8.0 STRL STRW (GLOVE) ×2 IMPLANT
GOWN STRL REUS W/TWL LRG LVL3 (GOWN DISPOSABLE) ×4 IMPLANT
GOWN STRL REUS W/TWL XL LVL3 (GOWN DISPOSABLE) IMPLANT
GUIDEWIRE BEAD TIP (WIRE) ×2 IMPLANT
HANDPIECE INTERPULSE COAX TIP (DISPOSABLE) ×1
IMMOBILIZER KNEE 16 UNIV (MISCELLANEOUS) ×2 IMPLANT
KIT BASIN OR (CUSTOM PROCEDURE TRAY) ×2 IMPLANT
MANIFOLD NEPTUNE II (INSTRUMENTS) ×2 IMPLANT
NS IRRIG 1000ML POUR BTL (IV SOLUTION) ×2 IMPLANT
PACK GENERAL/GYN (CUSTOM PROCEDURE TRAY) IMPLANT
PACK TOTAL JOINT (CUSTOM PROCEDURE TRAY) ×2 IMPLANT
PROTECTOR NERVE ULNAR (MISCELLANEOUS) IMPLANT
SET HNDPC FAN SPRY TIP SCT (DISPOSABLE) ×1 IMPLANT
SET PAD KNEE POSITIONER (MISCELLANEOUS) IMPLANT
STAPLER VISISTAT 35W (STAPLE) ×2 IMPLANT
SUT STRATAFIX 1PDS 45CM VIOLET (SUTURE) ×2 IMPLANT
SUT VIC AB 0 CT1 27 (SUTURE) ×2
SUT VIC AB 0 CT1 27XBRD ANTBC (SUTURE) ×2 IMPLANT
SUT VIC AB 2-0 CT1 27 (SUTURE) ×1
SUT VIC AB 2-0 CT1 TAPERPNT 27 (SUTURE) ×1 IMPLANT
SUT VLOC 180 0 24IN GS25 (SUTURE) ×4 IMPLANT
TOWEL OR 17X26 10 PK STRL BLUE (TOWEL DISPOSABLE) ×2 IMPLANT
TRAY FOLEY MTR SLVR 16FR STAT (SET/KITS/TRAYS/PACK) ×2 IMPLANT
WATER STERILE IRR 1000ML POUR (IV SOLUTION) IMPLANT

## 2018-11-09 NOTE — ED Triage Notes (Signed)
Pt presents via EMS from [facility]. Pt was recently diagnosed with a femur fracture and today, facility reports that she is bleeding at the site of the fracture. EMS reports there was a brace on so they didn't see any bleeding but reports staff had a picture. Pt is at her baseline mentally at this time, has hx of dementia.

## 2018-11-09 NOTE — ED Notes (Signed)
Ortho surgeon at bedside 

## 2018-11-09 NOTE — ED Notes (Addendum)
Pt on the way to OR. Checklist completed. Pt wiped down. Glasses sent home with family.

## 2018-11-09 NOTE — Consult Note (Addendum)
Reason for Consult: right distal periprosthetic femur fracture Referring Physician: ED, MD  Brittany [name] is an 84 y.o. female.  HPI: Patient is a 84 year old female with past medical history significant for CVA, dementia, hypertension, hyperlipidemia, hypothyroidism.  Apparently, patient was seen in the hospital for right distal femoral fracture that was managed conservatively.  Patient was discharged to a facility.  Patient presents today with what appears to be open, distal spiral femoral fracture.  ER provider has discussed the case extensively with the orthopedic team, and according to the ER provider, the orthopedic team has asked the ER team to call the hospitalist team to admit the patient.  Family at bedside to review history Patient unable to relate history  Past Medical History:  Diagnosis Date  . Blood transfusion 2006  . Colon polyps   . CVA (cerebral infarction) 2013  . Dementia ([place])   . Diverticulosis of colon   . DJD (degenerative joint disease)   . Hyperlipidemia   . Hypertension   . Hypothyroidism   . Melanoma ([place]) 03/20/15   removed with Moh's surgery  . Myelodysplasia 02/04/2012  . Normochromic normocytic anemia 02/04/2012  . Onychomycosis   . Osteoporosis   . Rectal prolapse   . Shoulder pain   . Unstable gait     Past Surgical History:  Procedure Laterality Date  . APPENDECTOMY    . CARPAL TUNNEL RELEASE Bilateral 2006  . FLEXIBLE SIGMOIDOSCOPY  07/09/2012   Procedure: FLEXIBLE SIGMOIDOSCOPY;  Surgeon: Brittany Castle, MD;  Location: [facility];  Service: Endoscopy;  Laterality: N/A;  . INCISION AND DRAINAGE PERIRECTAL ABSCESS N/A 09/21/2018   Procedure: IRRIGATION AND DEBRIDEMENT PERIRECTAL ABSCESS;  Surgeon: Brittany Bookbinder, MD;  Location: [facility];  Service: General;  Laterality: N/A;  . MELANOMA EXCISION Right 2005   arm  . POLYPECTOMY  2004   Laparoscopic  . RECTOCELE REPAIR    . REPLACEMENT TOTAL KNEE  2005 & 2006  . VAGINAL HYSTERECTOMY  1989     Family History  Problem Relation Age of Onset  . Prostate cancer Brother   . Cancer Brother        renal cell carcinoma  . Heart disease Brother   . Breast cancer Mother 79  . Cancer Father        unknown kind  . Arthritis Daughter     Social History:  reports that she has never smoked. She has never used smokeless tobacco. She reports that she does not drink alcohol or use drugs.  Allergies:  Allergies  Allergen Reactions  . Lisinopril Swelling  . Penicillins Rash    Has patient had a PCN reaction causing immediate rash, facial/tongue/throat swelling, SOB or lightheadedness with hypotension: Unknown Has patient had a PCN reaction causing severe rash involving mucus membranes or skin necrosis: Unknown Has patient had a PCN reaction that required hospitalization: Unknown Has patient had a PCN reaction occurring within the last 10 years: Unknown If all of the above answers are "NO", then may proceed with Cephalosporin use.    Medications: I have reviewed the patient's current medications. Scheduled:   Results for orders placed or performed during the hospital encounter of 11/09/18 (from the past 24 hour(s))  Basic metabolic panel     Status: Abnormal   Collection Time: 11/09/18  2:24 PM  Result Value Ref Range   Sodium 140 135 - 145 mmol/L   Potassium 4.8 3.5 - 5.1 mmol/L   Chloride 108 98 - 111 mmol/L   CO2 25  22 - 32 mmol/L   Glucose, Bld 105 (H) 70 - 99 mg/dL   BUN 32 (H) 8 - 23 mg/dL   Creatinine, Ser 1.01 (H) 0.44 - 1.00 mg/dL   Calcium 9.3 8.9 - 10.3 mg/dL   GFR calc non Af Amer 47 (L) >60 mL/min   GFR calc Af Amer 54 (L) >60 mL/min   Anion gap 7 5 - 15  CBC WITH DIFFERENTIAL     Status: Abnormal   Collection Time: 11/09/18  2:24 PM  Result Value Ref Range   WBC 12.5 (H) 4.0 - 10.5 K/uL   RBC 3.74 (L) 3.87 - 5.11 MIL/uL   Hemoglobin 10.3 (L) 12.0 - 15.0 g/dL   HCT 34.1 (L) 36.0 - 46.0 %   MCV 91.2 80.0 - 100.0 fL   MCH 27.5 26.0 - 34.0 pg   MCHC 30.2  30.0 - 36.0 g/dL   RDW 17.7 (H) 11.5 - 15.5 %   Platelets 283 150 - 400 K/uL   nRBC 0.0 0.0 - 0.2 %   Neutrophils Relative % 68 %   Neutro Abs 8.5 (H) 1.7 - 7.7 K/uL   Lymphocytes Relative 15 %   Lymphs Abs 1.9 0.7 - 4.0 K/uL   Monocytes Relative 12 %   Monocytes Absolute 1.5 (H) 0.1 - 1.0 K/uL   Eosinophils Relative 3 %   Eosinophils Absolute 0.4 0.0 - 0.5 K/uL   Basophils Relative 0 %   Basophils Absolute 0.1 0.0 - 0.1 K/uL   Smear Review PLATELET COUNT CONFIRMED BY SMEAR    Immature Granulocytes 2 %   Abs Immature Granulocytes 0.26 (H) 0.00 - 0.07 K/uL  Protime-INR     Status: None   Collection Time: 11/09/18  2:24 PM  Result Value Ref Range   Prothrombin Time 13.1 11.4 - 15.2 seconds   INR 1.00   Type and screen Brittany [name]     Status: None   Collection Time: 11/09/18  2:24 PM  Result Value Ref Range   ABO/RH(D) O POS    Antibody Screen NEG    Sample Expiration      11/12/2018 Performed at [facility], [place] [address] [place], [place] [address]     X-ray: CLINICAL DATA:  84 y/o F; right femur fracture with bleeding at the site of the fracture.  EXAM: RIGHT FEMUR 2 VIEWS  COMPARISON:  10/31/2018 femur radiographs  FINDINGS: Oblique acute fracture of the lower femoral diaphysis. One shaft's width medial displacement of the proximal fracture component. Increased overriding and shortening to 10 cm. The tip of the fracture now extends to the skin surface and may be open. Knee arthroplasty and right hip joints are maintained.  IMPRESSION: Acute oblique lower femoral diaphysis fracture with increased overriding and shortening of approximately 10 cm. The tip of the proximal fracture component now extends to the skin surface and may be open.   Electronically Signed   By: Brittany [name] M.D  ROS  Per HPI Patient demented so history obtained from family and prior notes  Blood pressure (!) 146/57, pulse (!)  58, temperature 97.8 F (36.6 C), temperature source Axillary, resp. rate 16, SpO2 100 %.  Physical Exam  Awake [name] O2 in place Right distal thigh wound dressed with blood tinged gauze Distal lateral thigh wound, 1 cm, serous drainage noted Prior knee replacement incision healed  Assessment/Plan: Grade I open distal periprosthetic femur fracture  Plan: To OR tonight for ORIF right distal femur fracture  Consent from family Admit to Hospitalist service due to multiple medical co-morbidities Disposition pending hospital stay Post op IV antibiotics for 48 hours NWB RLE Decubitus precautions Knee immobilizer at all times to RLE  Mauri Pole 11/09/2018, 8:14 PM

## 2018-11-09 NOTE — ED Provider Notes (Signed)
[facility] DEPT Provider Note   CSN: [id number] Arrival date & time: 11/09/18  1245   History   Chief Complaint Chief Complaint  Patient presents with  . Leg Pain    HPI Deira Shimer is a 84 y.o. female with a past medical history significant for closed displaced right femur fracture, dementia, CVA who presents for evaluation of right leg pain. Patient was diagnosed with a closed fracture of the distal end of the right femur on 10/31/18.  Patient is demented at baseline and DNR.  Per previous note, family and Dr. Maureen Ralphs, with [facility] decided on close management due to patient comorbidities. Today, patient was at her living facility, [facility], when nursing staff noticed bleeding through patients ace wrap this morning. Patient was sent to ED via EMS for evaluation.  Level 5 caveat secondary to dementia.  Patients daughter is in room to assist with history. No interpretor was used.  HPI  Past Medical History:  Diagnosis Date  . Blood transfusion 2006  . Colon polyps   . CVA (cerebral infarction) 2013  . Dementia ([place])   . Diverticulosis of colon   . DJD (degenerative joint disease)   . Hyperlipidemia   . Hypertension   . Hypothyroidism   . Melanoma ([place]) 03/20/15   removed with Moh's surgery  . Myelodysplasia 02/04/2012  . Normochromic normocytic anemia 02/04/2012  . Onychomycosis   . Osteoporosis   . Rectal prolapse   . Shoulder pain   . Unstable gait     Patient Active Problem List   Diagnosis Date Noted  . Pressure injury of skin 11/04/2018  . Femur fracture, right ([place]) 10/31/2018  . Pancytopenia ([place]) 09/24/2018  . Palliative care encounter   . Perirectal inflammation 09/20/2018  . Weight loss 07/28/2018  . Actinic keratosis 06/26/2018  . Diastolic CHF ([place]) 40/98/1191  . History of CVA (cerebrovascular accident) 08/08/2017  . CKD (chronic kidney disease) stage 3, GFR 30-59 ml/min (HCC) 08/08/2017  . Anemia, iron  deficiency 01/11/2016  . Unstable gait   . Shoulder pain   . History of melanoma 03/20/2015  . Constipation 07/18/2014  . Vascular dementia without behavioral disturbance ([place]) 11/11/2012  . Stroke ([place]) 12/17/2011  . Back pain 04/23/2011  . COLONIC POLYPS, HX OF 06/05/2009  . Myelodysplastic syndrome ([place]) 01/02/2009  . Hypothyroidism 08/21/2007  . Hyperlipidemia 08/21/2007  . Essential hypertension 08/21/2007  . DIVERTICULOSIS, COLON 08/21/2007  . OSTEOPOROSIS 08/21/2007  . SKIN CANCER, HX OF 08/21/2007    Past Surgical History:  Procedure Laterality Date  . APPENDECTOMY    . CARPAL TUNNEL RELEASE Bilateral 2006  . FLEXIBLE SIGMOIDOSCOPY  07/09/2012   Procedure: FLEXIBLE SIGMOIDOSCOPY;  Surgeon: Inda Castle, MD;  [facility];  Service: Endoscopy;  Laterality: N/A;  . INCISION AND DRAINAGE PERIRECTAL ABSCESS N/A 09/21/2018   Procedure: IRRIGATION AND DEBRIDEMENT PERIRECTAL ABSCESS;  Surgeon: Rolm Bookbinder, MD;  Location: [facility];  Service: General;  Laterality: N/A;  . MELANOMA EXCISION Right 2005   arm  . POLYPECTOMY  2004   Laparoscopic  . RECTOCELE REPAIR    . REPLACEMENT TOTAL KNEE  2005 & 2006  . VAGINAL HYSTERECTOMY  1989     OB History    Gravida  4   Para  4   Term  4   Preterm      AB      Living  4     SAB      TAB  Ectopic      Multiple      Live Births               Home Medications    Prior to Admission medications   Medication Sig Start Date End Date Taking? Authorizing Provider  acetaminophen (TYLENOL) 500 MG tablet Take 500 mg by mouth every 8 (eight) hours as needed for mild pain.  05/31/13  Yes Marletta Lor, MD  aspirin EC 81 MG tablet Take 81 mg by mouth daily.   Yes [provider]  cycloSPORINE (RESTASIS) 0.05 % ophthalmic emulsion Place 1 drop into both eyes 2 (two) times daily.     Yes [provider]  donepezil (ARICEPT) 10 MG tablet Take 10 mg by mouth at bedtime.   Yes  [provider]  hydroxypropyl methylcellulose / hypromellose (ISOPTO TEARS / GONIOVISC) 2.5 % ophthalmic solution Place 1 drop into both eyes every 2 (two) hours as needed for dry eyes.   Yes [provider]  levothyroxine (SYNTHROID, LEVOTHROID) 88 MCG tablet Take 1 tablet (88 mcg total) by mouth daily before breakfast. 09/25/18  Yes Starla Link, Kshitiz, MD  Metoprolol Tartrate 37.5 MG TABS Take 37.5 mg by mouth 2 (two) times daily.   Yes [provider]  NUTRITIONAL SUPPLEMENT LIQD Take 1 Magic Cup by mouth at dinner   Yes [provider]  Nutritional Supplements (ARGINAID EXTRA) LIQD Take 1 Container by mouth daily.   Yes [provider]  Nutritional Supplements (RESOURCE PO) Take 90 mLs by mouth 3 (three) times daily.   Yes [provider]  oxyCODONE (ROXICODONE) 5 MG immediate release tablet Take 0.5 tablets (2.5 mg total) by mouth every 8 (eight) hours as needed for up to 5 days. 11/04/18 11/09/18 Yes Elodia Florence., MD  OXYGEN Inhale 2 L into the lungs continuous. To keep sat above 90%   Yes [provider]  polyethylene glycol powder (GLYCOLAX/MIRALAX) powder Take 17 g by mouth daily as needed for mild constipation.    Yes [provider]  saccharomyces boulardii (FLORASTOR) 250 MG capsule Take 250 mg by mouth 2 (two) times daily. 11/04/18 11/09/18 Yes [provider]  Sodium Fluoride (PREVIDENT 5000 BOOSTER PLUS) 1.1 % PSTE Place 1 application onto teeth daily.    Yes [provider]  zinc oxide 20 % ointment Apply 1 application topically See admin instructions. Apply Zinc Oxide to buttocks topically after every incontinent episode and as needed. May keep at bedside    Yes [provider]  cephALEXin (KEFLEX) 500 MG capsule Take 1 capsule (500 mg total) by mouth 2 (two) times daily for 5 days. Patient not taking: Reported on 11/09/2018 11/04/18 11/09/18  Elodia Florence., MD     Family History Family History  Problem Relation Age of Onset  . Prostate cancer Brother   . Cancer Brother        renal cell carcinoma  . Heart disease Brother   . Breast cancer Mother 41  . Cancer Father        unknown kind  . Arthritis Daughter     Social History Social History   Tobacco Use  . Smoking status: Never Smoker  . Smokeless tobacco: Never Used  Substance Use Topics  . Alcohol use: No  . Drug use: No     Allergies   Lisinopril and Penicillins   Review of Systems Review of Systems  Unable to perform ROS: Dementia     Physical  Exam Updated Vital Signs BP (!) 139/54 (BP Location: Left Arm)   Pulse (!) 54   Temp 97.8 F (36.6 C) (Axillary)   Resp 15   SpO2 100%   Physical Exam  Constitutional: No distress.  HENT:  Head: Atraumatic.  Mouth/Throat: Oropharynx is clear and moist.  Eyes: Pupils are equal, round, and reactive to light.  Neck: Normal range of motion.  Cardiovascular: Normal rate, regular rhythm, normal heart sounds and intact distal pulses. Exam reveals no gallop and no friction rub.  No murmur heard. Pulmonary/Chest: Effort normal and breath sounds normal. No stridor. No respiratory distress. She has no wheezes. She has no rales.  Abdominal: Soft. Bowel sounds are normal. She exhibits no distension.  Musculoskeletal: Normal range of motion.  Right leg flexed at knee on initial evaluation. Straight leg splint is at patients ankle. Diffuse tenderness to palpation of femur and knee. Unable to follow commands to perform ROM on additional extremities.  2+ DP/ PT pulses bilaterally  Neurological: She is alert.  Unable to follow commands at baseline.  Skin: Skin is warm and dry. She is not diaphoretic.  Open wound to anterior portion of distal femur.  Mild surrounding erythema without edema or warmth.  Skin dry and warm to lower extremity.  No pallor.  Rashes or lesions.  Psychiatric: She has a normal mood and affect.  Nursing note and  vitals reviewed.        ED Treatments / Results  Labs (all labs ordered are listed, but only abnormal results are displayed) Labs Reviewed  BASIC METABOLIC PANEL - Abnormal; Notable for the following components:      Result Value   Glucose, Bld 105 (*)    BUN 32 (*)    Creatinine, Ser 1.01 (*)    GFR calc non Af Amer 47 (*)    GFR calc Af Amer 54 (*)    All other components within normal limits  CBC WITH DIFFERENTIAL/PLATELET - Abnormal; Notable for the following components:   RBC 3.74 (*)    Hemoglobin 10.3 (*)    HCT 34.1 (*)    RDW 17.7 (*)    All other components within normal limits  PROTIME-INR  TYPE AND SCREEN    EKG EKG Interpretation  Date/Time:  Monday November 09 2018 12:55:25 EST Ventricular Rate:  55 PR Interval:    QRS Duration: 92 QT Interval:  449 QTC Calculation: 430 R Axis:   7 Text Interpretation:  Sinus rhythm Nonspecific T abnormalities, anterior leads similar to prior 11/19 Confirmed by Aletta Edouard [phone]) on 11/09/2018 1:52:01 PM   Radiology No results found.  Procedures Procedures (including critical care time)  Medications Ordered in ED Medications  ceFAZolin (ANCEF) IVPB 1 g/50 mL premix (has no administration in time range)     Initial Impression / Assessment and Plan / ED Course  I have reviewed the triage vital signs and the nursing notes.  Pertinent labs & imaging results that were available during my care of the patient were reviewed by me and considered in my medical decision making (see chart for details).  84 year old female who is demented at baseline presents for evaluation of right knee image.  History of closed distal right femur fracture on 10/31/2018.  Was decided on nonoperative management with knee immobilizer at the time.  Nursing facility noticed bleeding outside of patient's Ace wrap.  Sent to the ED for evaluation.  Patient is demented at baseline unable to follow commands or voice complaints.  Patient's  family here in ED to help provide history.  Patient is neurovascularly intact.  She does have open wound to anterior surface of her distal right femur.  Mild surrounding erythema without edema or warmth.  There is mild active bleeding with palpation to femur.  No evidence of obvious hematoma.  Will obtain screening labs, EKG, imaging as well as Ancef and reevaluate.  Clinical Course as of Nov 10 1547  Mon Nov 09, 4150  [id number] 84 year old female with recent visit here for a distal femur fracture treated nonoperatively with a leg immobilizer here now with an open wound to the anterior portion of her distal thigh.  We have consulted orthopedics and getting some screening labs and imaging.  Disposition per Ortho recommendations.   [MB]    Clinical Course User Index [MB] Hayden Rasmussen, MD   CBC with hemoglobin at 10.3, 10.9 approximately 2 days ago.  The remaining portion of her CBC is pending.  Creatinine 1.01, mild elevation BUN at 32.  Imaging is pending at this time.  EKG sinus rhythm.  Afebrile, nonseptic appearing.  Family that is currently present state they would like to discuss with orthopedics possible options at this time.  Will plan on consulting with orthopedics for reevaluation once imaging is obtained for further disposition recommendations.  Patient care transferred to [facility] who will determine ultimate treatment, plan disposition of patient.  Final Clinical Impressions(s) / ED Diagnoses   Final diagnoses:  None    ED Discharge Orders    None       ,  A, PA-C 11/09/18 1548    Hayden Rasmussen, MD 11/09/18 1743

## 2018-11-09 NOTE — Anesthesia Postprocedure Evaluation (Signed)
Anesthesia Post Note  Patient: Brittany [name]  Procedure(s) Performed: INTRAMEDULLARY (IM) RETROGRADE FEMORAL NAILING (Right )     Patient location during evaluation: PACU Anesthesia Type: General Level of consciousness: awake and confused (pre-op baseline) Pain management: pain level controlled Vital Signs Assessment: post-procedure vital signs reviewed and stable Respiratory status: spontaneous breathing, nonlabored ventilation, respiratory function stable and patient connected to nasal cannula oxygen Cardiovascular status: blood pressure returned to baseline and stable Postop Assessment: no apparent nausea or vomiting Anesthetic complications: no    Last Vitals:  Vitals:   11/09/18 2245 11/09/18 2300  BP: (!) 152/98 (!) 153/63  Pulse: 77 71  Resp: (!) 23 20  Temp:    SpO2: 100% 99%    Last Pain:  Vitals:   11/09/18 1956  TempSrc:   PainSc: 10-Worst pain ever                 Lidia Collum

## 2018-11-09 NOTE — Brief Op Note (Signed)
11/09/2018  8:30 PM  PATIENT:  Brittany [name]  84 y.o. female  PRE-OPERATIVE DIAGNOSIS:  right Grade I open distal periprosthetic femur fracture  POST-OPERATIVE DIAGNOSIS:  right Grade I open distal periprosthetic femur fracture  PROCEDURE:  Procedure(s): ORIF right distal femur fracture I&D right distal thigh wound  SURGEON:  Surgeon(s) and Role:    Paralee Cancel, MD - Primary  PHYSICIAN ASSISTANT: None  ANESTHESIA:   general  EBL:  400 cc  BLOOD ADMINISTERED:none  DRAINS: none   LOCAL MEDICATIONS USED:  NONE  SPECIMEN:  No Specimen  DISPOSITION OF SPECIMEN:  N/A  COUNTS:  YES  TOURNIQUET:  * No tourniquets in log *  DICTATION: .Other Dictation: Dictation Number [id number]  PLAN OF CARE: Admit to inpatient   PATIENT DISPOSITION:  PACU - hemodynamically stable.   Delay start of Pharmacological VTE agent (>24hrs) due to surgical blood loss or risk of bleeding: no

## 2018-11-09 NOTE — ED Notes (Signed)
Bed: RV44 Expected date:  Expected time:  Means of arrival:  Comments: 84yo femur fx bleeding

## 2018-11-09 NOTE — Progress Notes (Signed)
Patient is a 84 year old female with past medical history significant for CVA, dementia, hypertension, hyperlipidemia, hypothyroidism.  Apparently, patient was seen in the hospital for right distal femoral fracture that was managed conservatively.  Patient was discharged to a facility.  Patient presents today with what appears to be open, distal spiral femoral fracture.  ER provider has discussed the case extensively with the orthopedic team, and according to the ER provider, the orthopedic team has asked the ER team to call the hospitalist team to admit the patient.

## 2018-11-09 NOTE — Transfer of Care (Signed)
Immediate Anesthesia Transfer of Care Note  Patient: Brittany [name]  Procedure(s) Performed: INTRAMEDULLARY (IM) RETROGRADE FEMORAL NAILING (Right )  Patient Location: PACU  Anesthesia Type:General  Level of Consciousness: drowsy and patient cooperative  Airway & Oxygen Therapy: Patient Spontanous Breathing and Patient connected to face mask oxygen  Post-op Assessment: Report given to RN and Post -op Vital signs reviewed and stable  Post vital signs: Reviewed and stable  Last Vitals:  Vitals Value Taken Time  BP 150/68 11/09/2018 10:38 PM  Temp    Pulse 79 11/09/2018 10:40 PM  Resp 18 11/09/2018 10:40 PM  SpO2 100 % 11/09/2018 10:40 PM  Vitals shown include unvalidated device data.  Last Pain:  Vitals:   11/09/18 1956  TempSrc:   PainSc: 10-Worst pain ever         Complications: No apparent anesthesia complications

## 2018-11-09 NOTE — ED Provider Notes (Signed)
Assumed care from [facility], PA-C at shift change with plan to follow-up on pending x-rays and consult orthopedics.  Briefly, 84 year old female with a past medical history of CVA, dementia (unable to follow commands at baseline), who recently sustained an angulated right mid/distal femoral shaft fracture on 10/31/2018. At that time, Dr. Maureen Ralphs with [facility], evaluated pt and decided not to operate due to her comorbidities. She was sent home in straight leg splint and was told to f/u outpt. Today patient is presenting with bleeding thru ace wrap. Found to have open area of skin to the right distal femur area. Xray of right femur with acute oblique lower femoral diaphysis fracture with increased overriding and shortening of approximately 10 cm. The tip of the proximal fracture component now extends to the skin surface and may be open.  CBC shows a mild leukocytosis, anemia at baseline.  BMP with elevated BUN and slightly elevated creatinine.  Appears grossly consistent with patient's baseline.  Patient given 2 g Ancef in the ED as well as pain medication.  Physical Exam  Constitutional: She is well-developed, well-nourished, and in no distress. No distress.  HENT:  Head: Normocephalic.  Patient with 2 cm x 2 cm wound to the right forehead that is actively draining purulent drainage.  Area is mildly tender to palpation.  Eyes: Conjunctivae are normal.  Neck: Neck supple.  Cardiovascular: Normal rate.  Pulmonary/Chest: Effort normal.  Abdominal: Soft.  Musculoskeletal:  Tenderness to palpation to the distal femur with deformity and small overlying wound.  Serosanguineous drainage noted from the wound.  Also with tenderness along the distal tip/fib with no obvious deformity.  Neurovascularly intact with normal sensation.  Neurological: She is alert.  Skin: Skin is warm and dry.  Psychiatric: Affect normal.  Nursing note and vitals reviewed.  Evaluated patient.  She is in no acute  distress.  Discussed findings with family at bedside.  Will consult orthopedics.  She is not received her Ancef yet as this nursing staff is been unable to establish an IV yet.  IV team has been consulted and was unable to establish IV.  Will have nursing staff attempt ultrasound-guided IV.  Patient will be given pain medications.  We will also give her a dose of doxycycline to cover potential MRSA given her head wound with purulent drainage.  6:17 PM Consult with Dr. Ihor Gully from orthopedics who recommended putting Betadine on gauze and covering the wound with this.  He recommended admission to medicine service.  Consult with Dr. Marthenia Rolling with hospitalist service who accepts the patient for admission.  Rodney Booze, PA-C 11/10/18 0038  Malvin Johns, MD 11/12/18 1455

## 2018-11-09 NOTE — ED Notes (Signed)
IV team at the bedside. 

## 2018-11-09 NOTE — Anesthesia Procedure Notes (Signed)
Procedure Name: Intubation Date/Time: 11/09/2018 8:48 PM Performed by: Montel Clock, CRNA Pre-anesthesia Checklist: Patient identified, Emergency Drugs available, Suction available, Patient being monitored and Timeout performed Patient Re-evaluated:Patient Re-evaluated prior to induction Oxygen Delivery Method: Circle system utilized Preoxygenation: Pre-oxygenation with 100% oxygen Induction Type: IV induction Ventilation: Mask ventilation without difficulty Laryngoscope Size: Mac and 3 Grade View: Grade II Tube type: Oral Tube size: 7.0 mm Number of attempts: 1 Airway Equipment and Method: Stylet Placement Confirmation: ETT inserted through vocal cords under direct vision,  positive ETCO2 and breath sounds checked- equal and bilateral Secured at: 21 cm Tube secured with: Tape Dental Injury: Teeth and Oropharynx as per pre-operative assessment

## 2018-11-09 NOTE — Anesthesia Preprocedure Evaluation (Addendum)
Anesthesia Evaluation  Patient identified by MRN, date of birth, ID band Patient confused    Reviewed: Allergy & Precautions, NPO status , Patient's Chart, lab work & pertinent test results  History of Anesthesia Complications Negative for: history of anesthetic complications  Airway Mallampati: III  TM Distance: >3 FB Neck ROM: Full    Dental no notable dental hx.    Pulmonary neg pulmonary ROS,    Pulmonary exam normal        Cardiovascular hypertension, Pt. on medications Normal cardiovascular exam+ dysrhythmias Atrial Fibrillation + Valvular Problems/Murmurs AS and MR      Neuro/Psych Dementia CVA    GI/Hepatic negative GI ROS, Neg liver ROS,   Endo/Other  Hypothyroidism   Renal/GU Renal InsufficiencyRenal disease  negative genitourinary   Musculoskeletal negative musculoskeletal ROS (+)   Abdominal   Peds  Hematology  (+) Blood dyscrasia (myelodysplasia), anemia ,   Anesthesia Other Findings dementia, CVA, HTN, PAF, CKDIII, hypothyroidism, myelodysplasia H/H 10.3/34, plt 283, INR 1.0  TTE 09/22/18: EF 55-60%, grade 1 DD, mild AS (valve area 1.44 cm2), mild MR, PAP 31 mmHg  Reproductive/Obstetrics                            Anesthesia Physical Anesthesia Plan  ASA: III and emergent  Anesthesia Plan: General   Post-op Pain Management:    Induction: Intravenous  PONV Risk Score and Plan: 3 and Ondansetron, Dexamethasone and Treatment may vary due to age or medical condition  Airway Management Planned: Oral ETT  Additional Equipment: None  Intra-op Plan:   Post-operative Plan: Extubation in OR and Possible Post-op intubation/ventilation  Informed Consent: I have reviewed the patients History and Physical, chart, labs and discussed the procedure including the risks, benefits and alternatives for the proposed anesthesia with the patient or authorized representative who has  indicated his/her understanding and acceptance.   Consent reviewed with POA  Plan Discussed with:   Anesthesia Plan Comments: (Discussed DNR/MOST form with daughter and son (who is POA). They agree that patient would not want chest compressions under any circumstances, even in the perioperative period, but that all other forms of resuscitation are acceptable in the perioperative setting (including intubation with possible postop ventilation, ICU transfer, vasopressors, IV fluids). )        Anesthesia Quick Evaluation

## 2018-11-09 NOTE — ED Notes (Signed)
Patient transported to X-ray 

## 2018-11-09 NOTE — Progress Notes (Signed)
Pharmacy Antibiotic Note  Brittany [name] is a 84 y.o. female admitted on 11/09/2018 with wound infection.  Patient s/p femoral fracture two weeks ago. Presents with bleeding at the site of fracture.  Patient with displaced femur fracture.  In the ED, patient has received Cefazolin 1gm IV x 1 dose and Doxycycline 100mg  po x 1 dose.  Pharmacy has been consulted for Vancomycin and Cefepime dosing.  Plan:  Vancomycin 1gm IV x 1 dose followed by 750mg  IV q48h (Goal AUC 400-500)  Cefepime 2gm IV x 1 dose followed by 1gm IV q24h  Follow renal function  Plan for OR tonight  F/U antibiotic plan   Temp (24hrs), Avg:97.5 F (36.4 C), Min:97.1 F (36.2 C), Max:97.8 F (36.6 C)  Recent Labs  Lab 11/03/18 0500 11/04/18 0529 11/05/18 11/09/18 1424  WBC 10.8* 9.8 7.7 12.5*  CREATININE 0.97 0.84 1.0 1.01*    Estimated Creatinine Clearance: 26.8 mL/min (A) (by C-G formula based on SCr of 1.01 mg/dL (H)).    Allergies  Allergen Reactions  . Lisinopril Swelling  . Penicillins Rash    Has patient had a PCN reaction causing immediate rash, facial/tongue/throat swelling, SOB or lightheadedness with hypotension: Unknown Has patient had a PCN reaction causing severe rash involving mucus membranes or skin necrosis: Unknown Has patient had a PCN reaction that required hospitalization: Unknown Has patient had a PCN reaction occurring within the last 10 years: Unknown If all of the above answers are "NO", then may proceed with Cephalosporin use.    Antimicrobials this admission: 11/25 Cefazolin x 1 dose  11/25 Doxycycline (po) x 1 dose  11/25 Cefepime >> 11/25 Vanc >>  Dose adjustments this admission:    Microbiology results:  Thank you for allowing pharmacy to be a part of this patient's care.  Everette Rank, PharmD 11/09/2018 7:55 PM

## 2018-11-09 NOTE — ED Notes (Signed)
Unsuccessful attempt at getting blood. Dylan, EMT is going to attempt.

## 2018-11-09 NOTE — Progress Notes (Signed)
Location:  [facility] Room Number: 26A Place of Service:  SNF (31) Provider: Tannie Koskela FNP-C  System, Pcp Not In  Patient Care Team: System, Pcp Not In as PCP - General Milus Banister, MD as Consulting Physician (Gastroenterology) Heath Lark, MD as Consulting Physician (Hematology and Oncology) Mast, Man X, NP as Nurse Practitioner (Internal Medicine)  Extended Emergency Contact Information Primary Emergency Contact: [facility] Address: [place], [place] [address] [name] of [place] Phone: [phone] Relation: Son Secondary Emergency Contact: [name],[name] Address: [address] [place] [address] [name] of [place] Phone: [phone] Relation: Daughter  Code Status:  DNR Goals of care: Advanced Directive information Advanced Directives 11/09/2018  Does Patient Have a Medical Advance Directive? -  Type of Paramedic of [place];Out of facility DNR (pink MOST or yellow form)  Does patient want to make changes to medical advance directive? -  Copy of [place] in Chart? -  Would patient like information on creating a medical advance directive? -  Pre-existing out of facility DNR order (yellow form or pink MOST form) Yellow form placed in chart (order not valid for inpatient use);Pink MOST form placed in chart (order not valid for inpatient use)     Chief Complaint  Patient presents with  . Acute Visit    right hip/knee pain and new onset of bleeding on the knee area     HPI:  Pt is a 84 y.o. female seen today at [facility] for an acute visit for evaluation of right hip pain/knee and new onset of bleeding.Provider met with therapist walking out from patient's room summoned provider to evaluate patient's right knee.Therapist states were trying to adjust patient's immobilizer upon unwrapping the velcro therapy noted bleeding through  patient's ACE wrap dressing.Patient screaming in pain.Nurse states pain medication was given at 6:30 am.Pain medication given every 8 hours.New order given for oxycodone 5 mg tablet X 1 dose to be administered.Unable to examine patient's knee bleeding site due to pain.Patient's daughter present at bedside provider discussed need to transfer to ED for further evaluation.Of note,patient is status post displaced right mid/distal femur fracture non-operative measure per family request and  post knee immobilizer on 10/31/2018. Per facility Nurse bleeding is new onset.    Past Medical History:  Diagnosis Date  . Blood transfusion 2006  . Colon polyps   . CVA (cerebral infarction) 2013  . Dementia ([place])   . Diverticulosis of colon   . DJD (degenerative joint disease)   . Hyperlipidemia   . Hypertension   . Hypothyroidism   . Melanoma ([place]) 03/20/15   removed with Moh's surgery  . Myelodysplasia 02/04/2012  . Normochromic normocytic anemia 02/04/2012  . Onychomycosis   . Osteoporosis   . Rectal prolapse   . Shoulder pain   . Unstable gait    Past Surgical History:  Procedure Laterality Date  . APPENDECTOMY    . CARPAL TUNNEL RELEASE Bilateral 2006  . FLEXIBLE SIGMOIDOSCOPY  07/09/2012   Procedure: FLEXIBLE SIGMOIDOSCOPY;  Surgeon: Inda Castle, MD;  Location: [facility];  Service: Endoscopy;  Laterality: N/A;  . INCISION AND DRAINAGE PERIRECTAL ABSCESS N/A 09/21/2018   Procedure: IRRIGATION AND DEBRIDEMENT PERIRECTAL ABSCESS;  Surgeon: Rolm Bookbinder, MD;  Location: [facility];  Service: General;  Laterality: N/A;  . MELANOMA EXCISION Right 2005  arm  . POLYPECTOMY  2004   Laparoscopic  . RECTOCELE REPAIR    . REPLACEMENT TOTAL KNEE  2005 & 2006  . VAGINAL HYSTERECTOMY  1989    Allergies  Allergen Reactions  . Lisinopril Swelling  . Penicillins Rash    Has patient had a PCN reaction causing immediate rash, facial/tongue/throat swelling, SOB or lightheadedness with hypotension:  Unknown Has patient had a PCN reaction causing severe rash involving mucus membranes or skin necrosis: Unknown Has patient had a PCN reaction that required hospitalization: Unknown Has patient had a PCN reaction occurring within the last 10 years: Unknown If all of the above answers are "NO", then may proceed with Cephalosporin use.    No facility-administered encounter medications on file as of 11/09/2018.    Outpatient Encounter Medications as of 11/09/2018  Medication Sig  . acetaminophen (TYLENOL) 500 MG tablet Take 500 mg by mouth every 8 (eight) hours as needed for mild pain.   Marland Kitchen aspirin EC 81 MG tablet Take 81 mg by mouth daily.  . cephALEXin (KEFLEX) 500 MG capsule Take 1 capsule (500 mg total) by mouth 2 (two) times daily for 5 days. (Patient not taking: Reported on 11/09/2018)  . cycloSPORINE (RESTASIS) 0.05 % ophthalmic emulsion Place 1 drop into both eyes 2 (two) times daily.    Marland Kitchen donepezil (ARICEPT) 10 MG tablet Take 10 mg by mouth at bedtime.  . hydroxypropyl methylcellulose / hypromellose (ISOPTO TEARS / GONIOVISC) 2.5 % ophthalmic solution Place 1 drop into both eyes every 2 (two) hours as needed for dry eyes.  Marland Kitchen levothyroxine (SYNTHROID, LEVOTHROID) 88 MCG tablet Take 1 tablet (88 mcg total) by mouth daily before breakfast.  . Metoprolol Tartrate 37.5 MG TABS Take 37.5 mg by mouth 2 (two) times daily.  Marland Kitchen NUTRITIONAL SUPPLEMENT LIQD Take 1 Magic Cup by mouth at dinner  . Nutritional Supplements (RESOURCE PO) Take 90 mLs by mouth 3 (three) times daily.  Marland Kitchen oxyCODONE (ROXICODONE) 5 MG immediate release tablet Take 0.5 tablets (2.5 mg total) by mouth every 8 (eight) hours as needed for up to 5 days.  . OXYGEN Inhale 2 L into the lungs continuous. To keep sat above 90%  . polyethylene glycol powder (GLYCOLAX/MIRALAX) powder Take 17 g by mouth daily as needed for mild constipation.   . saccharomyces boulardii (FLORASTOR) 250 MG capsule Take 250 mg by mouth 2 (two) times daily.  .  Sodium Fluoride (PREVIDENT 5000 BOOSTER PLUS) 1.1 % PSTE Place 1 application onto teeth daily.   Marland Kitchen zinc oxide 20 % ointment Apply 1 application topically See admin instructions. Apply Zinc Oxide to buttocks topically after every incontinent episode and as needed. May keep at bedside     Review of Systems  Unable to perform ROS: Acuity of condition (additional information provided by therapist and facility Nurse )    Immunization History  Administered Date(s) Administered  . Influenza Split 10/17/2011, 09/14/2012  . Influenza Whole 10/17/2007  . Influenza, High Dose Seasonal PF 10/11/2013, 10/05/2014  . Influenza,inj,Quad PF,6+ Mos 10/05/2018  . Influenza-Unspecified 09/05/2015, 10/02/2016, 10/06/2017  . PPD Test 01/12/2015  . Pneumococcal Conjugate-13 09/29/2014  . Pneumococcal Polysaccharide-23 12/16/1998  . Tdap 04/23/2011  . Zoster 07/04/2006   Pertinent  Health Maintenance Due  Topic Date Due  . OPHTHALMOLOGY EXAM  11/15/2014  . FOOT EXAM  11/30/2014  . URINE MICROALBUMIN  11/30/2014  . HEMOGLOBIN A1C  10/11/2015  . INFLUENZA VACCINE  Completed  . DEXA SCAN  Completed  . PNA vac  Low Risk Adult  Completed   Fall Risk  10/06/2018 10/05/2018 09/02/2017 07/04/2016 01/04/2016  Falls in the past year? No No No No No    Vitals:   11/09/18 1152  BP: (!) 144/78  Pulse: 74  Resp: 18  Temp: (!) 97.1 F (36.2 C)  TempSrc: Oral  SpO2: 95%  Weight: 120 lb (54.4 kg)  Height: '4\' 11"'$  (1.499 m)   Body mass index is 24.24 kg/m. Physical Exam  Constitutional: She appears well-developed and well-nourished.  Elderly,screaming loudly and holding right thigh due to pain.   Cardiovascular: Normal rate, regular rhythm and intact distal pulses. Exam reveals no gallop and no friction rub.  Murmur heard. Pulmonary/Chest: Effort normal and breath sounds normal. No respiratory distress. She has no wheezes. She has no rales.  Abdominal: Soft. Bowel sounds are normal. She exhibits no  distension and no mass. There is no tenderness. There is no rebound and no guarding.  Musculoskeletal: She exhibits tenderness. She exhibits no edema.  Moves x 3 extremities except right leg internally rotated and shorter than left leg.Protrusion noted on the knee area but unable to unwrap dressing or examine area due to pain.instructed therapy to wrap back patient's immobilizer for patient to be send to ED.  Skin: Skin is warm and dry. No rash noted. No erythema. No pallor.  Moderate amounts of bright red blood noted on patient's ACE wrap dressing on knee area.unable to visualize knee due to pain   Psychiatric: Her speech is normal.  Screaming loudly due to right leg pain   Nursing note and vitals reviewed.   Labs reviewed: Recent Labs    09/23/18 0443 09/24/18 0330 09/25/18 0539  11/03/18 0500 11/04/18 0529 11/05/18 11/09/18 1424  NA 141 142 141   < > 139 138 141 140  K 4.6 4.7 4.6   < > 4.0 3.8 4.4 4.8  CL 111 109 105   < > 113* 110  --  108  CO2 '26 28 30   '$ < > 19* 20*  --  25  GLUCOSE 103* 101* 89   < > 96 94  --  105*  BUN 25* 19 14   < > 30* 29* 26* 32*  CREATININE 0.89 0.87 0.86   < > 0.97 0.84 1.0 1.01*  CALCIUM 8.6* 8.7* 8.8*   < > 8.3* 8.5*  --  9.3  MG 1.9 1.5* 1.7  --   --   --   --   --    < > = values in this interval not displayed.   Recent Labs    09/17/18 0445 09/20/18 0706 09/21/18 0645  AST 43* 17 20  ALT 61* 31 25  ALKPHOS 97 72 64  BILITOT 1.0 1.1 0.6  PROT 5.2* 4.9* 5.2*  ALBUMIN 2.3* 2.2* 2.2*   Recent Labs    11/03/18 0500 11/04/18 0529 11/05/18 11/09/18 1424  WBC 10.8* 9.8 7.7 12.5*  NEUTROABS 7.4 6.3  --  8.5*  HGB 10.2* 10.8* 10.9* 10.3*  HCT 31.9* 33.0* 33* 34.1*  MCV 85.8 84.8  --  91.2  PLT 297 309 194 283   Lab Results  Component Value Date   TSH 1.88 08/11/2018   Lab Results  Component Value Date   HGBA1C 5.6 04/11/2015   Lab Results  Component Value Date   CHOL 147 04/22/2017   HDL 47 04/22/2017   LDLCALC 77 04/22/2017    LDLDIRECT 131.2 11/30/2013   TRIG 125 04/22/2017   CHOLHDL 4  11/30/2013    Significant Diagnostic Results in last 30 days:  Dg Elbow Complete Right  Result Date: 10/31/2018 CLINICAL DATA:  Lateral right elbow pain and abrasions following a fall today. EXAM: RIGHT ELBOW - COMPLETE 3+ VIEW COMPARISON:  None. FINDINGS: There is no evidence of fracture, dislocation, or joint effusion. There is no evidence of arthropathy or other focal bone abnormality. Soft tissues are unremarkable. IMPRESSION: No fracture or dislocation. Electronically Signed   By: Claudie Revering M.D.   On: 10/31/2018 15:20   Dg Tibia/fibula Right  Result Date: 11/09/2018 CLINICAL DATA:  Distal femur fracture.  Pain. EXAM: RIGHT TIBIA AND FIBULA - 2 VIEW COMPARISON:  11/09/2018 femur radiographs. FINDINGS: Distal femoral spiral fracture again noted, better characterized on the femur radiographs. This appears moderately angulated and with considerable overlap. Total knee prosthesis observed. No appreciable fracture of the tibia or fibula. IMPRESSION: 1. No tibial or fibular fracture is identified. 2. Notably displaced spiral fracture of the distal femur. Electronically Signed   By: Van Clines M.D.   On: 11/09/2018 18:10   Dg Chest Port 1 View  Result Date: 10/31/2018 CLINICAL DATA:  Fall, hypertension EXAM: PORTABLE CHEST 1 VIEW COMPARISON:  09/16/2018 FINDINGS: Cardiomegaly. Lungs clear. No effusions. No overt edema. No acute bony abnormality. Scoliosis and degenerative changes in the thoracic spine. IMPRESSION: Cardiomegaly.  No active disease. Electronically Signed   By: Rolm Baptise M.D.   On: 10/31/2018 16:49   Dg Shoulder Right Portable  Result Date: 10/31/2018 CLINICAL DATA:  Fall, right shoulder pain EXAM: PORTABLE RIGHT SHOULDER COMPARISON:  None. FINDINGS: No fracture or dislocation is seen. Moderate to severe degenerative changes with chronic rotator cuff tear. Visualized soft tissues are within normal limits.  Visualized right lung is clear. IMPRESSION: No fracture or dislocation is seen. Moderate to severe degenerative changes. Electronically Signed   By: Julian Hy M.D.   On: 10/31/2018 15:21   Dg Hip Unilat W Or Wo Pelvis 2-3 Views Right  Result Date: 10/31/2018 CLINICAL DATA:  Fall, right femur pain EXAM: DG HIP (WITH OR WITHOUT PELVIS) 2-3V RIGHT COMPARISON:  None. FINDINGS: No evidence of fracture dislocation of the right hip. Visualized bony pelvis appears intact. Bilateral hip joint spaces are symmetric. Distal femoral shaft fracture, described on dedicated femur radiographs. IMPRESSION: No evidence of fracture dislocation of the right hip. Distal femoral shaft fracture, described on dedicated femur radiographs. Electronically Signed   By: Julian Hy M.D.   On: 10/31/2018 15:24   Dg Femur, Min 2 Views Right  Result Date: 11/09/2018 CLINICAL DATA:  84 y/o F; right femur fracture with bleeding at the site of the fracture. EXAM: RIGHT FEMUR 2 VIEWS COMPARISON:  10/31/2018 femur radiographs FINDINGS: Oblique acute fracture of the lower femoral diaphysis. One shaft's width medial displacement of the proximal fracture component. Increased overriding and shortening to 10 cm. The tip of the fracture now extends to the skin surface and may be open. Knee arthroplasty and right hip joints are maintained. IMPRESSION: Acute oblique lower femoral diaphysis fracture with increased overriding and shortening of approximately 10 cm. The tip of the proximal fracture component now extends to the skin surface and may be open. Electronically Signed   By: Kristine Garbe M.D.   On: 11/09/2018 15:51   Dg Femur Min 2 Views Right  Result Date: 10/31/2018 CLINICAL DATA:  Fall, right femur pain EXAM: RIGHT FEMUR 2 VIEWS COMPARISON:  None. FINDINGS: Displaced, angulated right mid/distal femoral shaft fracture. Apex dorsal angulation is  suspected, although exact orientation is difficult due to difficulty  with patient positioning. Right knee arthroplasty. IMPRESSION: Displaced, angulated right mid/distal femoral shaft fracture. Electronically Signed   By: Julian Hy M.D.   On: 10/31/2018 15:26   Dg Femur Port, Min 2 Views Right  Result Date: 10/31/2018 CLINICAL DATA:  Status post traction and knee immobilization for known right distal femoral fracture. Initial encounter. EXAM: RIGHT FEMUR PORTABLE 2 VIEW COMPARISON:  None. FINDINGS: Status post traction and immobilization of the right femur, there is resolution of previously noted angulation at the distal femoral fracture, though approximately 9 cm of shortening is noted, with medial and anterior displacement of the distal femur, and mild rotation. The patient's total knee arthroplasty is grossly unremarkable in appearance. No new fractures are seen. The right femoral head remains seated at the acetabulum. IMPRESSION: Status post traction and immobilization of the right femur, there is resolution of previously noted angulation at the distal femoral fracture, though approximately 9 cm of shortening is noted, with medial and anterior displacement of the distal femur, and mild rotation. Electronically Signed   By: Garald Balding M.D.   On: 10/31/2018 22:04    Assessment/Plan 1. Right hip pain Status post displaced right mid/distal femur fracture post fall episode in the facility non-operative measure per family request and post knee immobilizer on 10/31/2018. Screaming due to pain.Oxycodone 5 mg tablet x 1 dose now ordered and send to ED for further evaluation.   2. Acute pain of right knee Has new onset bleeding on right knee area noted by therapy.Protrusion noted on knee area but unable to further examine due to patient's pain.Discused with patient and daughter present at bedside need to transfer to ED for further evaluation.Both in agreed with transfer to ED.    Family/ staff Communication: Reviewed plan of care with patient,patient's daughter  and facility Nurse  Labs/tests ordered: Transfer to ED for evaluation of right hip/knee pain with new onset of bleeding on knee area.   Sandrea Hughs, NP

## 2018-11-10 ENCOUNTER — Encounter (HOSPITAL_COMMUNITY): Payer: Self-pay

## 2018-11-10 ENCOUNTER — Other Ambulatory Visit: Payer: Self-pay

## 2018-11-10 DIAGNOSIS — S7291XE Unspecified fracture of right femur, subsequent encounter for open fracture type I or II with routine healing: Secondary | ICD-10-CM

## 2018-11-10 DIAGNOSIS — S72491B Other fracture of lower end of right femur, initial encounter for open fracture type I or II: Secondary | ICD-10-CM

## 2018-11-10 DIAGNOSIS — D649 Anemia, unspecified: Secondary | ICD-10-CM

## 2018-11-10 LAB — BASIC METABOLIC PANEL
ANION GAP: 7 (ref 5–15)
ANION GAP: 11 (ref 5–15)
BUN: 32 mg/dL — ABNORMAL HIGH (ref 8–23)
BUN: 32 mg/dL — AB (ref 8–23)
CHLORIDE: 109 mmol/L (ref 98–111)
CO2: 22 mmol/L (ref 22–32)
CO2: 20 mmol/L — AB (ref 22–32)
Calcium: 8.9 mg/dL (ref 8.9–10.3)
Calcium: 8.8 mg/dL — ABNORMAL LOW (ref 8.9–10.3)
Chloride: 106 mmol/L (ref 98–111)
Creatinine, Ser: 1.03 mg/dL — ABNORMAL HIGH (ref 0.44–1.00)
Creatinine, Ser: 1 mg/dL (ref 0.44–1.00)
GFR calc non Af Amer: 46 mL/min — ABNORMAL LOW (ref >60)
GFR calc non Af Amer: 49 mL/min — ABNORMAL LOW (ref >60)
GFR, EST AFRICAN AMERICAN: 53 mL/min — AB (ref >60)
GFR, EST AFRICAN AMERICAN: 57 mL/min — AB (ref >60)
Glucose, Bld: 181 mg/dL — ABNORMAL HIGH (ref 70–99)
Glucose, Bld: 174 mg/dL — ABNORMAL HIGH (ref 70–99)
POTASSIUM: 5.8 mmol/L — AB (ref 3.5–5.1)
Potassium: 5.6 mmol/L — ABNORMAL HIGH (ref 3.5–5.1)
SODIUM: 138 mmol/L (ref 135–145)
SODIUM: 137 mmol/L (ref 135–145)

## 2018-11-10 LAB — CBC
HCT: 31.4 % — ABNORMAL LOW (ref 36.0–46.0)
HEMOGLOBIN: 9.4 g/dL — AB (ref 12.0–15.0)
MCH: 27.5 pg (ref 26.0–34.0)
MCHC: 29.9 g/dL — ABNORMAL LOW (ref 30.0–36.0)
MCV: 91.8 fL (ref 80.0–100.0)
NRBC: 0 % (ref 0.0–0.2)
Platelets: 288 10*3/uL (ref 150–400)
RBC: 3.42 MIL/uL — AB (ref 3.87–5.11)
RDW: 17.7 % — ABNORMAL HIGH (ref 11.5–15.5)
WBC: 17.2 10*3/uL — ABNORMAL HIGH (ref 4.0–10.5)

## 2018-11-10 MED ORDER — OXYCODONE HCL 5 MG PO TABS: 2.5000 mg | ORAL_TABLET | ORAL | Status: DC | PRN

## 2018-11-10 MED ORDER — SODIUM CHLORIDE 0.9 % IV SOLN
INTRAVENOUS | Status: AC
  Administered 2018-11-10 (×2): via INTRAVENOUS

## 2018-11-10 MED ORDER — CYCLOSPORINE 0.05 % OP EMUL
1.0000 [drp] | Freq: Two times a day (BID) | OPHTHALMIC | Status: DC
  Administered 2018-11-10 – 2018-11-13 (×6): 1 [drp] via OPHTHALMIC
  Filled 2018-11-10 (×9): qty 1

## 2018-11-10 MED ORDER — FERROUS SULFATE 325 (65 FE) MG PO TABS
325.0000 mg | ORAL_TABLET | Freq: Three times a day (TID) | ORAL | Status: DC
  Administered 2018-11-10 – 2018-11-13 (×10): 325 mg via ORAL
  Filled 2018-11-10 (×10): qty 1

## 2018-11-10 MED ORDER — TETANUS-DIPHTHERIA TOXOIDS TD 5-2 LFU IM INJ
0.5000 mL | INJECTION | Freq: Once | INTRAMUSCULAR | Status: AC
  Administered 2018-11-10: 0.5 mL via INTRAMUSCULAR
  Filled 2018-11-10: qty 0.5

## 2018-11-10 MED ORDER — HYDROMORPHONE HCL 1 MG/ML IJ SOLN: 0.5000 mg | INTRAMUSCULAR | Status: DC | PRN

## 2018-11-10 MED ORDER — ASPIRIN EC 81 MG PO TBEC
81.0000 mg | DELAYED_RELEASE_TABLET | Freq: Every day | ORAL | Status: DC
  Administered 2018-11-10 – 2018-11-13 (×4): 81 mg via ORAL
  Filled 2018-11-10 (×4): qty 1

## 2018-11-10 MED ORDER — PHENOL 1.4 % MT LIQD: 1.0000 | OROMUCOSAL | Status: DC | PRN

## 2018-11-10 MED ORDER — ACETAMINOPHEN 325 MG PO TABS: 325.0000 mg | ORAL_TABLET | Freq: Four times a day (QID) | ORAL | Status: DC | PRN

## 2018-11-10 MED ORDER — TETANUS-DIPHTH-ACELL PERTUSSIS 5-2.5-18.5 LF-MCG/0.5 IM SUSP: 0.5000 mL | Freq: Once | INTRAMUSCULAR | Status: DC

## 2018-11-10 MED ORDER — LEVOTHYROXINE SODIUM 88 MCG PO TABS
88.0000 ug | ORAL_TABLET | Freq: Every day | ORAL | Status: DC
  Administered 2018-11-10 – 2018-11-13 (×3): 88 ug via ORAL
  Filled 2018-11-10 (×4): qty 1

## 2018-11-10 MED ORDER — ACETAMINOPHEN 500 MG PO TABS
1000.0000 mg | ORAL_TABLET | Freq: Four times a day (QID) | ORAL | Status: AC
  Administered 2018-11-10 (×4): 1000 mg via ORAL
  Filled 2018-11-10 (×4): qty 2

## 2018-11-10 MED ORDER — POLYVINYL ALCOHOL 1.4 % OP SOLN: 1.0000 [drp] | OPHTHALMIC | Status: DC | PRN

## 2018-11-10 MED ORDER — SACCHAROMYCES BOULARDII 250 MG PO CAPS
250.0000 mg | ORAL_CAPSULE | Freq: Two times a day (BID) | ORAL | Status: DC
  Administered 2018-11-10 – 2018-11-13 (×7): 250 mg via ORAL
  Filled 2018-11-10 (×8): qty 1

## 2018-11-10 MED ORDER — METOPROLOL TARTRATE 25 MG PO TABS
37.5000 mg | ORAL_TABLET | Freq: Two times a day (BID) | ORAL | Status: DC
  Administered 2018-11-10 – 2018-11-13 (×7): 37.5 mg via ORAL
  Filled 2018-11-10 (×8): qty 2

## 2018-11-10 MED ORDER — HYDROMORPHONE HCL 1 MG/ML IJ SOLN
0.5000 mg | INTRAMUSCULAR | Status: DC | PRN
  Administered 2018-11-11: 0.5 mg via INTRAVENOUS
  Filled 2018-11-10: qty 0.5

## 2018-11-10 MED ORDER — CEFAZOLIN SODIUM-DEXTROSE 2-4 GM/100ML-% IV SOLN
2.0000 g | Freq: Four times a day (QID) | INTRAVENOUS | Status: AC
  Administered 2018-11-10 (×2): 2 g via INTRAVENOUS
  Filled 2018-11-10 (×2): qty 100

## 2018-11-10 MED ORDER — ENOXAPARIN SODIUM 30 MG/0.3ML ~~LOC~~ SOLN
30.0000 mg | Freq: Every day | SUBCUTANEOUS | Status: DC
  Administered 2018-11-10 – 2018-11-13 (×4): 30 mg via SUBCUTANEOUS
  Filled 2018-11-10 (×4): qty 0.3

## 2018-11-10 MED ORDER — MENTHOL 3 MG MT LOZG: 1.0000 | LOZENGE | OROMUCOSAL | Status: DC | PRN

## 2018-11-10 MED ORDER — ONDANSETRON HCL 4 MG PO TABS: 4.0000 mg | ORAL_TABLET | Freq: Four times a day (QID) | ORAL | Status: DC | PRN

## 2018-11-10 MED ORDER — VANCOMYCIN HCL IN DEXTROSE 750-5 MG/150ML-% IV SOLN: 750.0000 mg | INTRAVENOUS | Status: DC

## 2018-11-10 MED ORDER — JUVEN PO PACK
1.0000 | PACK | Freq: Every day | ORAL | Status: DC
  Administered 2018-11-10 – 2018-11-13 (×2): 1 via ORAL
  Filled 2018-11-10 (×4): qty 1

## 2018-11-10 MED ORDER — METOCLOPRAMIDE HCL 5 MG/ML IJ SOLN: 5.0000 mg | Freq: Three times a day (TID) | INTRAMUSCULAR | Status: DC | PRN

## 2018-11-10 MED ORDER — SODIUM FLUORIDE 1.1 % DT PSTE: 1.0000 "application " | PASTE | Freq: Every day | DENTAL | Status: DC

## 2018-11-10 MED ORDER — ZINC OXIDE 20 % EX OINT: 1.0000 "application " | TOPICAL_OINTMENT | CUTANEOUS | Status: DC

## 2018-11-10 MED ORDER — DOCUSATE SODIUM 100 MG PO CAPS
100.0000 mg | ORAL_CAPSULE | Freq: Two times a day (BID) | ORAL | Status: DC
  Administered 2018-11-10 – 2018-11-13 (×7): 100 mg via ORAL
  Filled 2018-11-10 (×8): qty 1

## 2018-11-10 MED ORDER — METOCLOPRAMIDE HCL 5 MG PO TABS: 5.0000 mg | ORAL_TABLET | Freq: Three times a day (TID) | ORAL | Status: DC | PRN

## 2018-11-10 MED ORDER — POLYETHYLENE GLYCOL 3350 17 G PO PACK
17.0000 g | PACK | Freq: Every day | ORAL | Status: DC | PRN
  Administered 2018-11-12: 17 g via ORAL
  Filled 2018-11-10: qty 1

## 2018-11-10 MED ORDER — DONEPEZIL HCL 10 MG PO TABS
10.0000 mg | ORAL_TABLET | Freq: Every day | ORAL | Status: DC
  Administered 2018-11-10 – 2018-11-12 (×3): 10 mg via ORAL
  Filled 2018-11-10 (×5): qty 1

## 2018-11-10 MED ORDER — ONDANSETRON HCL 4 MG/2ML IJ SOLN: 4.0000 mg | Freq: Four times a day (QID) | INTRAMUSCULAR | Status: DC | PRN

## 2018-11-10 NOTE — Op Note (Addendum)
NAMEOCIE, STANZIONE MEDICAL RECORD [id number] ACCOUNT [id number] DATE OF BIRTH:Apr 24, 1924 FACILITY: [facility] LOCATION: [facility] PHYSICIAN:Bianka Liberati Marian Sorrow, MD  OPERATIVE REPORT  DATE OF PROCEDURE:  11/09/2018  PREOPERATIVE DIAGNOSIS:  Grade I open distal periprosthetic femur fracture.  POSTOPERATIVE DIAGNOSIS:  Grade I open distal periprosthetic femur fracture.  PROCEDURE: 1.  Excisional and nonexcisional debridement of right lateral distal thigh wound. See body of procedure for details 2.  Open reduction internal fixation of right distal femur fracture utilizing 4 Zimmer cables. 3.  Fluoroscopic imagery.  SURGEON:  Paralee Cancel, MD  ASSISTANT:  Surgical team.  ANESTHESIA:  General.  SPECIMENS:  None.  COMPLICATIONS:  None.  ESTIMATED BLOOD LOSS:  About 400 mL.  DRAINS:  None.  INDICATIONS:  The patient is a 84 year old female with history of dementia and other medical comorbidities.  She had actually had an injury to her right lower extremity about a week ago.  She was seen and evaluated in the emergency room and recommended  at the time to have conservative treatment of her right lower extremity.  She was placed in a knee immobilizer.  She was having increasing pain and discomfort working with physical therapy.  It was noted that she had a laceration of the lateral distal  thigh with exposed bone.  She was brought back to the emergency room for evaluation.  In the emergency room, she was seen and evaluated with family.  Wounds were evaluated.  It was recommended that she at this point undergo open reduction internal  fixation for better alignment of her lower extremity and stabilization.  The initial plan was for an intramedullary nailing of the right distal femur through her femoral component.  See body of procedure for details of the procedure and findings.  Risks of nonunion, malunion, need for future surgeries, risk of DVT, infection were reviewed and discussed.  Consent was  obtained for benefit of fracture management.  PROCEDURE IN DETAIL:  The patient was brought to the operative theater.  Once adequate anesthesia and preoperative antibiotics were administered, which included vancomycin and ceftazidime because of PENICILLIN ALLERGY, she was positioned supine.  A Foley  catheter was placed.  She was cleaned.  The right lower extremity was pre-draped out with a drape into the perineum.  I then prescrubbed her right lower extremity including the fracture site and open wound.  The right lower extremity was then prepped  and draped in sterile fashion.  A timeout was performed identifying the patient, the planned procedure and extremity.  The initial plan was to perform intramedullary nail through her femoral component.  I thus opened up her knee incision.  Soft tissue dissection was carried down to the capsule, which was then incised.  As I opened up the knee joint, we identified  immediately that she had a closed box femoral component.  At this point, I made a decision based on the long obliquity of her fracture pattern that I would be able to stabilize the bone fragments together with the use of cables as opposed to opening her  laterally and putting a plate with cables and screws.  For this reason, I extended the incision more proximal.  I exposed through the quadriceps muscles at the fracture site.  Then, with identifying the fracture segments and evacuating the hematoma to  help with visualization, I was able to apply traction to the lower extremity and apply some rotation and anatomically positioned the fracture fragments.  The oblique segment was at least a 5-inch segment.  I held the fracture reduced with a clamp and then  applied the cables.  I placed 4 cables from the very proximal aspect of the fracture site all the way to the joint at the distal aspect.  All 4 cables were wrapped carefully, then tensioned appropriately, crimped and cut.  Fluoroscopy was used several   different times throughout the procedure to confirm anatomic reduction and position of the cables.  Final radiographs were obtained in the case.  At this point, I excised the lateral wound with a scalpel about 2-2.5 cm.  I excised with the scalpel the skin and underlying nonviable tissue.  The skin and underlying tissue appeared healthy, bleeding.  No bone needed to be excised based on fracture pattern.  No muscle was excised. We  then irrigated the entire knee, and this lateral wound with 3 L normal saline solution with a pulse lavage.  I then reapproximated the extensor mechanism from its entire extent utilizing a combination of #1 Vicryl sutures and then barbed locking sutures.   The lateral leg distal thigh wound was closed with 2-0 Vicryl.  The skin was closed with 2-0 Vicryl and then staples.  As we were undressing her and underneath the area where I had wrapped her lower leg with a stockinette and Coban, we identified a  U-shaped skin tear.  I elected at this point to utilize the skin as a biologic dressing and used a narrow stapler to staple the skin edges back into its position.  I then placed an Adaptic over this as well as over her knee incision and placed gauze and  then sterilely wrapped with a bulky wrap.  She was placed in a knee immobilizer.  She was then extubated and brought to the recovery room in stable condition, tolerating the procedure well.  I reviewed the surgical procedure with her family, the  variation in the surgical procedure based on the intraoperative findings and the decision process.  The long-term plan in terms of getting her bone to heal with regard to activity were all reviewed and discussed.  Questions were reviewed as necessary.  We will change our plan to make her be nonweightbearing to the right lower extremity.  Decubitus precautions will be necessary.  LN/NUANCE  D:11/09/2018 T:11/10/2018 JOB:[id number]

## 2018-11-10 NOTE — Progress Notes (Signed)
PROGRESS NOTE    [name]  HFW:[id number] DOB: [birth year] DOA: 11/09/2018 PCP: System, Pcp Not In   Brief Narrative:  Brittany [name] is Brittany [name] 84 y.o. female with medical history significant of CVA, dementia, hypertension, hyperlipidemia, hypothyroidism, osteoporosis presenting from her nursing home for evaluation of bleeding at the site of recent femur fracture.  History of closed distal right femur fracture on October 31, 2018.  Was decided on nonoperative management with Nemo immobilizer at the time.  Nursing facility noticed bleeding outside of patient's ACE wrap and she was sent to the ED for evaluation.  Patient has baseline dementia and no history could be obtained from her.  She does not know she is [name] and is not sure why she is here.  Complaining of pain in her right lower extremity.  Assessment & Plan:   Principal Problem:   Open right femoral fracture [facility]) Active Problems:   Hypothyroidism   Hyperlipidemia   Essential hypertension   Stroke (HCC)   Leukocytosis   Anemia   Right Grade 1 Open Distal Periprosthetic Femur Fracture:  - Status post ORIF and I&D of right distal thigh wound on November 09, 2018. - Continue postop IV antibiotics (vancomycin, cefepime -> narrow to vancomycin given MRSA positve, per discussion with pharmacy, based on renal function, she should not require any additional doses of abx) for 48 hours per orthopedic recommendation - Ortho following; appreciate recs - maintain foley, decubitus precautions, NWB to RLE, limited PT at this time - Incentive spirometry - Pain management: Dilaudid 0.5 mg every 4 hours as needed, Tylenol currently scheduled, Robaxin as needed, OxyIR 2.5 mg every 4 hours as needed -IV Zofran PRN nausea - give td vaccine  Leukocytosis - afebrile, WBC elevated, likely reactive - urine cx pending  Hyperkalemia: mild to 5.6, repeat tomorrow in AM (no contributing meds seen, no K in IVF, continue to monitor)  Chronic  anemia 9.4 today, down from 10.3 on presentation, continue to monitor -Repeat CBC in [name]  CVA -Continue home aspirin  Dementia -Continue home Aricept  Hypertension Systolic currently in the 140s. -Continue home metoprolol tartrate 37.5 mg twice daily.  Hypothyroidism -Continue home Synthroid  Full thickness wound: per wound cares discussion with pt daughter, they note that pt had surgical procedure for perirectal abscess in October and this is full thickness wound and not pressure injury.  Appreciate wound care recs  DVT prophylaxis: lovenox  Code Status: DNR Family Communication: none at bedside Disposition Plan: pending improvement   Consultants:   ortho  Procedures:  11/25 ORIF right distal femur fracture, I&D distal thigh wound  Antimicrobials:  Anti-infectives (From admission, onward)   Start     Dose/Rate Route Frequency Ordered Stop   11/11/18 2300  vancomycin (VANCOCIN) IVPB 750 mg/150 ml premix  Status:  Discontinued     750 mg 150 mL/hr over 60 Minutes Intravenous Every 48 hours 11/09/18 2005 11/10/18 1644   11/11/18 2000  vancomycin (VANCOCIN) IVPB 750 mg/150 ml premix  Status:  Discontinued     750 mg 150 mL/hr over 60 Minutes Intravenous Every 48 hours 11/10/18 1705 11/10/18 1708   11/10/18 2300  ceFEPIme (MAXIPIME) 1 g in sodium chloride 0.9 % 100 mL IVPB  Status:  Discontinued     1 g 200 mL/hr over 30 Minutes Intravenous Every 24 hours 11/09/18 2005 11/10/18 1644   11/10/18 0300  ceFAZolin (ANCEF) IVPB 2g/100 mL premix     2 g 200 mL/hr over 30 Minutes Intravenous  Every 6 hours 11/10/18 0110 11/10/18 0945   11/09/18 2011  vancomycin (VANCOCIN) 1-5 GM/200ML-% IVPB    Note to Pharmacy:  Benjamin Stain   : cabinet override      11/09/18 2011 11/09/18 2030   11/09/18 2000  ceFEPIme (MAXIPIME) 2 g in sodium chloride 0.9 % 100 mL IVPB     2 g 200 mL/hr over 30 Minutes Intravenous STAT 11/09/18 1946 11/09/18 2115   11/09/18 2000  vancomycin  (VANCOCIN) IVPB 1000 mg/200 mL premix     1,000 mg 200 mL/hr over 60 Minutes Intravenous STAT 11/09/18 1946 11/09/18 2130   11/09/18 1645  doxycycline (VIBRA-TABS) tablet 100 mg     100 mg Oral  Once 11/09/18 1633 11/09/18 1707   11/09/18 1430  ceFAZolin (ANCEF) IVPB 1 g/50 mL premix     1 g 100 mL/hr over 30 Minutes Intravenous  Once 11/09/18 1418 11/09/18 1848     Subjective: Irlanda Croghan&Ox1. Can't tell me what happened.  Objective: Vitals:   11/10/18 0207 11/10/18 0500 11/10/18 0858 11/10/18 1400  BP: 125/71 130/61 (!) 146/66 (!) 122/59  Pulse: 84 61 61 61  Resp: 18 17 16    Temp: (!) 97.4 F (36.3 C) (!) 97.5 F (36.4 C) 97.6 F (36.4 C) (!) 97.3 F (36.3 C)  TempSrc: Oral Oral Oral Oral  SpO2: 100%  100% 98%  Weight:      Height:        Intake/Output Summary (Last 24 hours) at 11/10/2018 1645 Last data filed at 11/10/2018 1500 Gross per 24 hour  Intake 1405.36 ml  Output 1030 ml  Net 375.36 ml   Filed Weights   11/09/18 2317  Weight: 54.4 kg    Examination:  General exam: Appears calm and comfortable  Respiratory system: Clear to auscultation. Respiratory effort normal. Cardiovascular system: S1 & S2 heard, RRR Gastrointestinal system: Abdomen is nondistended, soft and nontender Central nervous system: Alert and oriented x1. No focal neurological deficits. Extremities: RLE dressing intact, brace in place Skin: decubitus ulcer Psychiatry: Judgement and insight appear normal. Mood & affect appropriate.     Data Reviewed: I have personally reviewed following labs and imaging studies  CBC: Recent Labs  Lab 11/04/18 0529 11/05/18 11/09/18 1424 11/10/18 0505  WBC 9.8 7.7 12.5* 17.2*  NEUTROABS 6.3  --  8.5*  --   HGB 10.8* 10.9* 10.3* 9.4*  HCT 33.0* 33* 34.1* 31.4*  MCV 84.8  --  91.2 91.8  PLT 309 194 283 852   Basic Metabolic Panel: Recent Labs  Lab 11/04/18 0529 11/05/18 11/09/18 1424 11/10/18 0505 11/10/18 0938  NA 138 141 140 138 137  K 3.8 4.4  4.8 5.8* 5.6*  CL 110  --  108 109 106  CO2 20*  --  25 22 20*  GLUCOSE 94  --  105* 181* 174*  BUN 29* 26* 32* 32* 32*  CREATININE 0.84 1.0 1.01* 1.03* 1.00  CALCIUM 8.5*  --  9.3 8.9 8.8*   GFR: Estimated Creatinine Clearance: 27 mL/min (by C-G formula based on SCr of 1 mg/dL). Liver Function Tests: No results for input(s): AST, ALT, ALKPHOS, BILITOT, PROT, ALBUMIN in the last 168 hours. No results for input(s): LIPASE, AMYLASE in the last 168 hours. No results for input(s): AMMONIA in the last 168 hours. Coagulation Profile: Recent Labs  Lab 11/09/18 1424  INR 1.00   Cardiac Enzymes: No results for input(s): CKTOTAL, CKMB, CKMBINDEX, TROPONINI in the last 168 hours. BNP (last 3 results) No results  for input(s): PROBNP in the last 8760 hours. HbA1C: No results for input(s): HGBA1C in the last 72 hours. CBG: No results for input(s): GLUCAP in the last 168 hours. Lipid Profile: No results for input(s): CHOL, HDL, LDLCALC, TRIG, CHOLHDL, LDLDIRECT in the last 72 hours. Thyroid Function Tests: No results for input(s): TSH, T4TOTAL, FREET4, T3FREE, THYROIDAB in the last 72 hours. Anemia Panel: No results for input(s): VITAMINB12, FOLATE, FERRITIN, TIBC, IRON, RETICCTPCT in the last 72 hours. Sepsis Labs: No results for input(s): PROCALCITON, LATICACIDVEN in the last 168 hours.  Recent Results (from the past 240 hour(s))  MRSA PCR Screening     Status: Abnormal   Collection Time: 11/02/18 11:34 AM  Result Value Ref Range Status   MRSA by PCR POSITIVE (Kyron Schlitt) NEGATIVE Final    Comment:        The GeneXpert MRSA Assay (FDA approved for NASAL specimens only), is one component of Sad[facility] Zeoli comprehensive MRSA colonization surveillance program. It is not intended to diagnose MRSA infection nor to guide or monitor treatment for MRSA infections. RESULT CALLED TO, READ BACK BY AND VERIFIED WITH: TAYLOR,J @ [id number] ON [id number] BY POTEAT,S Performed at [facility], [place]  [address] [place], [place] [address]   Urine Culture     Status: Abnormal   Collection Time: 11/02/18  5:39 PM  Result Value Ref Range Status   Specimen Description   Final    URINE, RANDOM Performed at [place] [address] [place], [place] [address]    Special Requests   Final    NONE Performed at [facility], [place] [address] [place], [place] [address]    Culture >=100,000 COLONIES/mL ESCHERICHIA COLI (Hanadi Stanly)  Final   Report Status 11/05/2018 FINAL  Final   Organism ID, Bacteria ESCHERICHIA COLI (Quenton Recendez)  Final      Susceptibility   Escherichia coli - MIC*    AMPICILLIN >=32 RESISTANT Resistant     CEFAZOLIN <=4 SENSITIVE Sensitive     CEFTRIAXONE <=1 SENSITIVE Sensitive     CIPROFLOXACIN >=4 RESISTANT Resistant     GENTAMICIN <=1 SENSITIVE Sensitive     IMIPENEM <=0.25 SENSITIVE Sensitive     NITROFURANTOIN <=16 SENSITIVE Sensitive     TRIMETH/SULFA <=20 SENSITIVE Sensitive     AMPICILLIN/SULBACTAM <=2 SENSITIVE Sensitive     PIP/TAZO 8 SENSITIVE Sensitive     Extended ESBL NEGATIVE Sensitive     * >=100,000 COLONIES/mL ESCHERICHIA COLI         Radiology Studies: Dg Tibia/fibula Right  Result Date: 11/09/2018 CLINICAL DATA:  Distal femur fracture.  Pain. EXAM: RIGHT TIBIA AND FIBULA - 2 VIEW COMPARISON:  11/09/2018 femur radiographs. FINDINGS: Distal femoral spiral fracture again noted, better characterized on the femur radiographs. This appears moderately angulated and with considerable overlap. Total knee prosthesis observed. No appreciable fracture of the tibia or fibula. IMPRESSION: 1. No tibial or fibular fracture is identified. 2. Notably displaced spiral fracture of the distal femur. Electronically Signed   By: Van Clines M.D.   On: 11/09/2018 18:10   Dg C-arm 1-60 Min-no Report  Result Date: 11/09/2018 Fluoroscopy was utilized by the requesting physician.  No radiographic interpretation.   Dg Femur, Min 2 Views  Right  Result Date: 11/10/2018 CLINICAL DATA:  Intraoperative images.  Right femur fracture. EXAM: RIGHT FEMUR 2 VIEWS COMPARISON:  Preoperative femur radiographs earlier this day. FINDINGS: Three fluoroscopic spot views obtained in the operating room in frontal and lateral projections demonstrate cerclage  wires fixating distal femur fracture. Fracture is in improved anatomic alignment. Right knee arthroplasty partially visualized. Total fluoroscopy time 9 seconds. Dose 0.70 mGy. IMPRESSION: Procedural fluoroscopy post cerclage wire fixation of distal femur fracture. Electronically Signed   By: Keith Rake M.D.   On: 11/10/2018 04:48   Dg Femur, Min 2 Views Right  Result Date: 11/09/2018 CLINICAL DATA:  84 y/o F; right femur fracture with bleeding at the site of the fracture. EXAM: RIGHT FEMUR 2 VIEWS COMPARISON:  10/31/2018 femur radiographs FINDINGS: Oblique acute fracture of the lower femoral diaphysis. One shaft's width medial displacement of the proximal fracture component. Increased overriding and shortening to 10 cm. The tip of the fracture now extends to the skin surface and may be open. Knee arthroplasty and right hip joints are maintained. IMPRESSION: Acute oblique lower femoral diaphysis fracture with increased overriding and shortening of approximately 10 cm. The tip of the proximal fracture component now extends to the skin surface and may be open. Electronically Signed   By: Kristine Garbe M.D.   On: 11/09/2018 15:51        Scheduled Meds: . acetaminophen  1,000 mg Oral Q6H  . aspirin EC  81 mg Oral Daily  . cycloSPORINE  1 drop Both Eyes BID  . docusate sodium  100 mg Oral BID  . donepezil  10 mg Oral QHS  . enoxaparin (LOVENOX) injection  30 mg Subcutaneous Daily  . ferrous sulfate  325 mg Oral TID PC  . levothyroxine  88 mcg Oral QAC breakfast  . metoprolol tartrate  37.5 mg Oral BID  . nutrition supplement (JUVEN)  1 packet Oral Daily  . saccharomyces  boulardii  250 mg Oral BID  . Sodium Fluoride  1 application dental Daily  . zinc oxide  1 application Topical See admin instructions   Continuous Infusions: . sodium chloride 25 mL/hr at 11/10/18 0647  . methocarbamol (ROBAXIN) IV 500 mg (11/09/18 2317)     LOS: 1 day    Time spent: over 40 min    Fayrene Helper, MD [facility] Pager [phone]  If 7PM-7AM, please contact night-coverage [email or web address] Password [facility] 11/10/2018, 4:45 PM

## 2018-11-10 NOTE — Progress Notes (Signed)
Patient ID: Brittany [name], female   DOB: October 24, 1924, 84 y.o.   MRN: [id number] Subjective: 1 Day Post-Op Procedure(s) (LRB): INTRAMEDULLARY (IM) RETROGRADE FEMORAL NAILING (Right)    Patient sleeping this am. Dementia.  Nurses rounding on her at time of rounds.  Discussed concerns outlined below.  No events reported over night  Objective:   VITALS:   Vitals:   11/10/18 0207 11/10/18 0500  BP: 125/71 130/61  Pulse: 84 61  Resp: 18 17  Temp: (!) 97.4 F (36.3 C) (!) 97.5 F (36.4 C)  SpO2: 100%     Incision: dressing C/D/I  LABS Recent Labs    11/09/18 1424 11/10/18 0505  HGB 10.3* 9.4*  HCT 34.1* 31.4*  WBC 12.5* 17.2*  PLT 283 288    Recent Labs    11/09/18 1424 11/10/18 0505  NA 140 138  K 4.8 5.8*  BUN 32* 32*  CREATININE 1.01* 1.03*  GLUCOSE 105* 181*    Recent Labs    11/09/18 1424  INR 1.00     Assessment/Plan: 1 Day Post-Op Procedure(s) (LRB): INTRAMEDULLARY (IM) RETROGRADE FEMORAL NAILING (Right)   Advance diet   Maintain foley or equivalent to prevent soiling of bed, I&Os Decubitus precautions Medical management, K+ elevated, reordered to make certain not spurious  NWB RLE PT will be limited Dispo to SNF once stable with well outlined plan

## 2018-11-10 NOTE — Consult Note (Addendum)
Branch Nurse wound consult note Reason for Consult: Consult requested for sacrum/buttocks.  Daughter at bedside states pt had a surgical procedure for a perirectal abscess in October and this is a full thickness wound, NOT a pressure injury. Measurement: Wound extends across sacrum and right buttock; 4X3X2.5cm, bone palpable when probed with a swab. Wound bed: 10% yellow, 90% red and moist Drainage (amount, consistency, odor) mod amt tan drainage, no odor Periwound: intact skin surrounding Dressing procedure/placement/frequency: Aquacel to absorb drainage and provide antimicrobial benefits.  Foam dressing to protect from further injury.  Discussed plan of care with family member at the bedside.  Left leg with full thickness linear skin tear; skin well approximated, 4X.1X.1cm, no drainage, red moist wound bed.  Foam dressing to protect and promote healing. Please re-consult if further assistance is needed.  Thank-you,  Julien Girt MSN, [place], [place], [place], [place]

## 2018-11-10 NOTE — H&P (Signed)
History and Physical    Brittany [name] MHD:[id number] DOB: [birth year] DOA: 11/09/2018  PCP: System, Pcp Not In Patient coming from: Nursing home  Chief Complaint: Sent from nursing facility for evaluation of bleeding at the site of recent femur fracture.  HPI: Brittany [name] is a 84 y.o. female with medical history significant of CVA, dementia, hypertension, hyperlipidemia, hypothyroidism, osteoporosis presenting from her nursing home for evaluation of bleeding at the site of recent femur fracture.  History of closed distal right femur fracture on October 31, 2018.  Was decided on nonoperative management with Nemo immobilizer at the time.  Nursing facility noticed bleeding outside of patient's ACE wrap and she was sent to the ED for evaluation.  Patient has baseline dementia and no history could be obtained from her.  She does not know she is at a hospital and is not sure why she is here.  Complaining of pain in her right lower extremity.  ED Course: Afebrile.  [place] count 12.5.  Hemoglobin 10.3, was 10.9 five days ago.  X-ray of right femur showing acute oblique lower femoral diaphysis fracture with increased overriding and shortening of approximately 10 cm.  The tip of the proximal fracture component now extending to the skin surface and may be open.  Patient was taken to the OR on November 09, 2018 for ORIF of right distal femur fracture and I&D of right distal thigh wound.  Ortho requested admission to hospitalist team due to multiple medical comorbidities.  Review of Systems: As per HPI otherwise 10 point review of systems negative.  Past Medical History:  Diagnosis Date  . Blood transfusion 2006  . Colon polyps   . CVA (cerebral infarction) 2013  . Dementia ([place])   . Diverticulosis of colon   . DJD (degenerative joint disease)   . Hyperlipidemia   . Hypertension   . Hypothyroidism   . Melanoma ([place]) 03/20/15   removed with Moh's surgery  . Myelodysplasia 02/04/2012  . Normochromic  normocytic anemia 02/04/2012  . Onychomycosis   . Osteoporosis   . Rectal prolapse   . Shoulder pain   . Unstable gait     Past Surgical History:  Procedure Laterality Date  . APPENDECTOMY    . CARPAL TUNNEL RELEASE Bilateral 2006  . FLEXIBLE SIGMOIDOSCOPY  07/09/2012   Procedure: FLEXIBLE SIGMOIDOSCOPY;  Surgeon: Inda Castle, MD;  Location: [facility];  Service: Endoscopy;  Laterality: N/A;  . INCISION AND DRAINAGE PERIRECTAL ABSCESS N/A 09/21/2018   Procedure: IRRIGATION AND DEBRIDEMENT PERIRECTAL ABSCESS;  Surgeon: Rolm Bookbinder, MD;  Location: [facility];  Service: General;  Laterality: N/A;  . MELANOMA EXCISION Right 2005   arm  . POLYPECTOMY  2004   Laparoscopic  . RECTOCELE REPAIR    . REPLACEMENT TOTAL KNEE  2005 & 2006  . VAGINAL HYSTERECTOMY  1989     reports that she has never smoked. She has never used smokeless tobacco. She reports that she does not drink alcohol or use drugs.  Allergies  Allergen Reactions  . Lisinopril Swelling  . Penicillins Rash    Has patient had a PCN reaction causing immediate rash, facial/tongue/throat swelling, SOB or lightheadedness with hypotension: Unknown Has patient had a PCN reaction causing severe rash involving mucus membranes or skin necrosis: Unknown Has patient had a PCN reaction that required hospitalization: Unknown Has patient had a PCN reaction occurring within the last 10 years: Unknown If all of the above answers are "NO", then may proceed with Cephalosporin use.  Family History  Problem Relation Age of Onset  . Prostate cancer Brother   . Cancer Brother        renal cell carcinoma  . Heart disease Brother   . Breast cancer Mother 109  . Cancer Father        unknown kind  . Arthritis Daughter     Prior to Admission medications   Medication Sig Start Date End Date Taking? Authorizing Provider  acetaminophen (TYLENOL) 500 MG tablet Take 500 mg by mouth every 8 (eight) hours as needed for mild pain.  05/31/13   Yes Marletta Lor, MD  aspirin EC 81 MG tablet Take 81 mg by mouth daily.   Yes [provider]  cycloSPORINE (RESTASIS) 0.05 % ophthalmic emulsion Place 1 drop into both eyes 2 (two) times daily.     Yes [provider]  donepezil (ARICEPT) 10 MG tablet Take 10 mg by mouth at bedtime.   Yes [provider]  hydroxypropyl methylcellulose / hypromellose (ISOPTO TEARS / GONIOVISC) 2.5 % ophthalmic solution Place 1 drop into both eyes every 2 (two) hours as needed for dry eyes.   Yes [provider]  levothyroxine (SYNTHROID, LEVOTHROID) 88 MCG tablet Take 1 tablet (88 mcg total) by mouth daily before breakfast. 09/25/18  Yes Starla Link, Kshitiz, MD  Metoprolol Tartrate 37.5 MG TABS Take 37.5 mg by mouth 2 (two) times daily.   Yes [provider]  NUTRITIONAL SUPPLEMENT LIQD Take 1 Magic Cup by mouth at dinner   Yes [provider]  Nutritional Supplements (ARGINAID EXTRA) LIQD Take 1 Container by mouth daily.   Yes [provider]  Nutritional Supplements (RESOURCE PO) Take 90 mLs by mouth 3 (three) times daily.   Yes [provider]  OXYGEN Inhale 2 L into the lungs continuous. To keep sat above 90%   Yes [provider]  polyethylene glycol powder (GLYCOLAX/MIRALAX) powder Take 17 g by mouth daily as needed for mild constipation.    Yes [provider]  Sodium Fluoride (PREVIDENT 5000 BOOSTER PLUS) 1.1 % PSTE Place 1 application onto teeth daily.    Yes [provider]  zinc oxide 20 % ointment Apply 1 application topically See admin instructions. Apply Zinc Oxide to buttocks topically after every incontinent episode and as needed. May keep at bedside    Yes [provider]    Physical Exam: Vitals:   11/09/18 2317 11/09/18 2332 11/09/18 2345 11/10/18 0005  BP: (!) 147/76 124/90 (!) 149/59 (!) 147/76  Pulse: 77 80 70 77  Resp: 18 20 19 18   Temp: (!) 97.4 F (36.3 C)  (!) 97.4 F (36.3  C) (!) 97.4 F (36.3 C)  TempSrc: Oral     SpO2: 100% 99% 98% 100%  Weight: 54.4 kg     Height: 4' 11.02" (1.499 m)       Physical Exam  Constitutional: No distress.  Resting comfortably in a hospital bed watching television  HENT:  Head: Normocephalic.  Eyes: Right eye exhibits no discharge. Left eye exhibits no discharge.  Neck: Neck supple.  Cardiovascular: Normal rate, regular rhythm and intact distal pulses.  Pulmonary/Chest: Effort normal. She has no wheezes. She has no rales.  Anterior lung fields clear to auscultation.  Abdominal: Soft. Bowel sounds are normal. She exhibits no distension. There is no tenderness.  Musculoskeletal: She exhibits no edema.  Right lower extremity: Knee immobilizer in place, dorsalis pedis pulse intact.  Neurological:  Awake and alert, oriented to self  only  Skin: Skin is warm and dry. She is not diaphoretic.     Labs on Admission: I have personally reviewed following labs and imaging studies  CBC: Recent Labs  Lab 11/03/18 0500 11/04/18 0529 11/05/18 11/09/18 1424  WBC 10.8* 9.8 7.7 12.5*  NEUTROABS 7.4 6.3  --  8.5*  HGB 10.2* 10.8* 10.9* 10.3*  HCT 31.9* 33.0* 33* 34.1*  MCV 85.8 84.8  --  91.2  PLT 297 309 194 161   Basic Metabolic Panel: Recent Labs  Lab 11/03/18 0500 11/04/18 0529 11/05/18 11/09/18 1424  NA 139 138 141 140  K 4.0 3.8 4.4 4.8  CL 113* 110  --  108  CO2 19* 20*  --  25  GLUCOSE 96 94  --  105*  BUN 30* 29* 26* 32*  CREATININE 0.97 0.84 1.0 1.01*  CALCIUM 8.3* 8.5*  --  9.3   GFR: Estimated Creatinine Clearance: 26.8 mL/min (A) (by C-G formula based on SCr of 1.01 mg/dL (H)). Liver Function Tests: No results for input(s): AST, ALT, ALKPHOS, BILITOT, PROT, ALBUMIN in the last 168 hours. No results for input(s): LIPASE, AMYLASE in the last 168 hours. No results for input(s): AMMONIA in the last 168 hours. Coagulation Profile: Recent Labs  Lab 11/09/18 1424  INR 1.00   Cardiac Enzymes: No  results for input(s): CKTOTAL, CKMB, CKMBINDEX, TROPONINI in the last 168 hours. BNP (last 3 results) No results for input(s): PROBNP in the last 8760 hours. HbA1C: No results for input(s): HGBA1C in the last 72 hours. CBG: No results for input(s): GLUCAP in the last 168 hours. Lipid Profile: No results for input(s): CHOL, HDL, LDLCALC, TRIG, CHOLHDL, LDLDIRECT in the last 72 hours. Thyroid Function Tests: No results for input(s): TSH, T4TOTAL, FREET4, T3FREE, THYROIDAB in the last 72 hours. Anemia Panel: No results for input(s): VITAMINB12, FOLATE, FERRITIN, TIBC, IRON, RETICCTPCT in the last 72 hours. Urine analysis:    Component Value Date/Time   COLORURINE YELLOW 11/01/2018 1149   APPEARANCEUR CLOUDY (A) 11/01/2018 1149   LABSPEC 1.011 11/01/2018 1149   LABSPEC 1.015 02/11/2007 1508   PHURINE 5.0 11/01/2018 1149   GLUCOSEU NEGATIVE 11/01/2018 1149   HGBUR SMALL (A) 11/01/2018 1149   BILIRUBINUR NEGATIVE 11/01/2018 1149   BILIRUBINUR Negative 02/11/2007 1508   KETONESUR NEGATIVE 11/01/2018 1149   PROTEINUR NEGATIVE 11/01/2018 1149   UROBILINOGEN 0.2 04/20/2013 1608   NITRITE POSITIVE (A) 11/01/2018 1149   LEUKOCYTESUR LARGE (A) 11/01/2018 1149   LEUKOCYTESUR Small 02/11/2007 1508    Radiological Exams on Admission: Dg Tibia/fibula Right  Result Date: 11/09/2018 CLINICAL DATA:  Distal femur fracture.  Pain. EXAM: RIGHT TIBIA AND FIBULA - 2 VIEW COMPARISON:  11/09/2018 femur radiographs. FINDINGS: Distal femoral spiral fracture again noted, better characterized on the femur radiographs. This appears moderately angulated and with considerable overlap. Total knee prosthesis observed. No appreciable fracture of the tibia or fibula. IMPRESSION: 1. No tibial or fibular fracture is identified. 2. Notably displaced spiral fracture of the distal femur. Electronically Signed   By: Van Clines M.D.   On: 11/09/2018 18:10   Dg C-arm 1-60 Min-no Report  Result Date:  11/09/2018 Fluoroscopy was utilized by the requesting physician.  No radiographic interpretation.   Dg Femur, Min 2 Views Right  Result Date: 11/09/2018 CLINICAL DATA:  84 y/o F; right femur fracture with bleeding at the site of the fracture. EXAM: RIGHT FEMUR 2 VIEWS COMPARISON:  10/31/2018 femur radiographs FINDINGS: Oblique acute fracture of the lower femoral diaphysis.  One shaft's width medial displacement of the proximal fracture component. Increased overriding and shortening to 10 cm. The tip of the fracture now extends to the skin surface and may be open. Knee arthroplasty and right hip joints are maintained. IMPRESSION: Acute oblique lower femoral diaphysis fracture with increased overriding and shortening of approximately 10 cm. The tip of the proximal fracture component now extends to the skin surface and may be open. Electronically Signed   By: Kristine Garbe M.D.   On: 11/09/2018 15:51    EKG: Independently reviewed.  Sinus rhythm, nonspecific T wave abnormalities.  Similar to prior tracing.  Assessment/Plan Principal Problem:   Open right femoral fracture [facility]) Active Problems:   Hypothyroidism   Hyperlipidemia   Essential hypertension   Stroke (HCC)   Leukocytosis   Anemia   Open right distal femur fracture  -Status post ORIF and I&D of right distal thigh wound on November 09, 2018. -Continue postop IV antibiotics (vancomycin, cefepime) for 48 hours per orthopedic recommendation -Ortho following; appreciate recs -PT evaluation -Incentive spirometry -Pain management: Dilaudid 0.5-1 mg every 4 hours as needed, Tylenol, Robaxin as needed, OxyIR 2.5-5 mg every 4 hours as needed -IV Zofran PRN nausea  Mild leukocytosis  [place] count 12.5.  Likely reactive.  Patient is afebrile. -Urine culture ordered in the ED pending -Repeat CBC in a.m.  Chronic anemia Hemoglobin 10.3 in the ED, was 10.9 five days ago.  Ferritin 946 on August 11, 2018. -Repeat CBC in  a.m.  CVA -Continue home aspirin  Dementia -Continue home Aricept  Hypertension Systolic currently in the 140s. -Continue home metoprolol tartrate 37.5 mg twice daily.  Hypothyroidism -Continue home Synthroid  DVT prophylaxis: Lovenox Code Status: DNR based on documentation from [facility] care and chart review.  Family Communication: No family available Disposition Plan: Anticipate discharge to SNF in 1 to 2 days. Consults called: Orthopedic surgery Admission status: It is my clinical opinion that admission to INPATIENT is reasonable and necessary in this 84 y.o. female . presenting from nursing home for evaluation of increased bleeding of recently diagnosed right distal femur fracture. . in the context of PMH including: Recent right distal femur fracture . and pertinent positives on radiographic and laboratory data including: Evidence of open right distal femur fracture on imaging. . Workup and treatment include patient was taken to the OR for ORIF and I&D.   Given the aforementioned, the predictability of an adverse outcome is felt to be significant. I expect that the patient will require at least 2 midnights in the hospital to treat this condition.    Shela Leff MD [facility] Pager [phone]  If 7PM-7AM, please contact night-coverage [email or web address] Password [email or web address]  11/10/2018, 1:39 AM

## 2018-11-10 NOTE — Progress Notes (Signed)
PT Cancellation Note  Patient Details Name: Brittany [name] MRN: [id number] DOB: [birth year]   Cancelled Treatment:    Reason Eval/Treat Not Completed: Other (comment);Active bedrest order; noted orders discontinued by MD.  Will await new order prior to initiating PT.   Reginia Naas 11/10/2018, 9:04 AM  Magda Kiel, PT Acute Rehabilitation Services [phone] 11/10/2018

## 2018-11-10 NOTE — Progress Notes (Signed)
Pharmacy: Re- vancomycin  Patient's a 84 y.o F s/p ORIF right distal femur fx on 11/25.  Patient received vancomycin 1gm dose at 8:30p on 11/25.  This dose should last patient for 48 hrs d/t crcl~27.  Plan: - Will not give any more vancomycin dose. - pharmacy will sign off.  Re-consult [place] if need further assistance.  Dia Sitter, PharmD, BCPS 11/10/2018 5:11 PM

## 2018-11-10 NOTE — Progress Notes (Signed)
Patient from [facility] SNF.  CSW following for discharge needs.   Kathrin Greathouse, Marlinda Mike, MSW Clinical Social Worker  [phone] 11/10/2018  1:27 PM

## 2018-11-11 DIAGNOSIS — D72829 Elevated white blood cell count, unspecified: Secondary | ICD-10-CM

## 2018-11-11 DIAGNOSIS — D649 Anemia, unspecified: Secondary | ICD-10-CM

## 2018-11-11 DIAGNOSIS — I10 Essential (primary) hypertension: Secondary | ICD-10-CM

## 2018-11-11 LAB — BASIC METABOLIC PANEL
Anion gap: 10 (ref 5–15)
BUN: 42 mg/dL — AB (ref 8–23)
CO2: 21 mmol/L — ABNORMAL LOW (ref 22–32)
CREATININE: 1.05 mg/dL — AB (ref 0.44–1.00)
Calcium: 8.8 mg/dL — ABNORMAL LOW (ref 8.9–10.3)
Chloride: 111 mmol/L (ref 98–111)
GFR calc Af Amer: 53 mL/min — ABNORMAL LOW (ref >60)
GFR, EST NON AFRICAN AMERICAN: 46 mL/min — AB (ref >60)
GLUCOSE: 115 mg/dL — AB (ref 70–99)
Potassium: 5.1 mmol/L (ref 3.5–5.1)
SODIUM: 142 mmol/L (ref 135–145)

## 2018-11-11 LAB — CBC
HCT: 24.8 % — ABNORMAL LOW (ref 36.0–46.0)
Hemoglobin: 7.5 g/dL — ABNORMAL LOW (ref 12.0–15.0)
MCH: 27.4 pg (ref 26.0–34.0)
MCHC: 30.2 g/dL (ref 30.0–36.0)
MCV: 90.5 fL (ref 80.0–100.0)
PLATELETS: 280 10*3/uL (ref 150–400)
RBC: 2.74 MIL/uL — ABNORMAL LOW (ref 3.87–5.11)
RDW: 17.8 % — ABNORMAL HIGH (ref 11.5–15.5)
WBC: 14.9 10*3/uL — ABNORMAL HIGH (ref 4.0–10.5)
nRBC: 0.1 % (ref 0.0–0.2)

## 2018-11-11 LAB — MAGNESIUM: MAGNESIUM: 1.7 mg/dL (ref 1.7–2.4)

## 2018-11-11 MED ORDER — OXYCODONE HCL 5 MG PO TABS: 2.5000 mg | ORAL_TABLET | Freq: Three times a day (TID) | ORAL | 0 refills | Status: DC | PRN

## 2018-11-11 MED ORDER — ASPIRIN 81 MG PO CHEW: 81.0000 mg | CHEWABLE_TABLET | Freq: Two times a day (BID) | ORAL | 0 refills | Status: AC

## 2018-11-11 NOTE — Progress Notes (Signed)
     Subjective: 2 Days Post-Op Procedure(s) (LRB): INTRAMEDULLARY (IM) RETROGRADE FEMORAL NAILING (Right)   Seen by Dr. Alvan Dame. Patient expressing no significant pain.  Sleeping / resting comfortably in bed.  No reported events throughout the night.     Objective:   VITALS:   Vitals:   11/10/18 2134 11/11/18 0508  BP: (!) 167/61 137/64  Pulse: 88 74  Resp: 15 18  Temp: 98.3 F (36.8 C) 98.1 F (36.7 C)  SpO2: 98% 100%    No cellulitis present Compartment soft  LABS Recent Labs    11/09/18 1424 11/10/18 0505 11/11/18 0503  HGB 10.3* 9.4* 7.5*  HCT 34.1* 31.4* 24.8*  WBC 12.5* 17.2* 14.9*  PLT 283 288 280    Recent Labs    11/10/18 0505 11/10/18 0938 11/11/18 0503  NA 138 137 142  K 5.8* 5.6* 5.1  BUN 32* 32* 42*  CREATININE 1.03* 1.00 1.05*  GLUCOSE 181* 174* 115*     Assessment/Plan: 2 Days Post-Op Procedure(s) (LRB): INTRAMEDULLARY (IM) RETROGRADE FEMORAL NAILING (Right)   Discharge disposition TBD      [place] Pugh. Areana Kosanke   PAC  11/11/2018, 9:07 AM

## 2018-11-11 NOTE — Progress Notes (Signed)
PROGRESS NOTE    Adrean Findlay  JQB:[id number] DOB: [birth year] DOA: 11/09/2018 PCP: System, Pcp Not In   Brief Narrative:  Brittany [name] is a 84 y.o. female with medical history significant of CVA, dementia, hypertension, hyperlipidemia, hypothyroidism, osteoporosis presenting from her nursing home for evaluation of bleeding at the site of recent femur fracture.  History of closed distal right femur fracture on October 31, 2018.  Was decided on nonoperative management with Nemo immobilizer at the time.  Nursing facility noticed bleeding outside of patient's ACE wrap and she was sent to the ED for evaluation.  Patient has baseline dementia and no history could be obtained from her.    Patient was seen by orthopedics and underwent surgery on 11/26.  Assessment & Plan:   Right Grade 1 Open Distal Periprosthetic Femur Fracture:  - Status post ORIF and I&D of right distal thigh wound on November 09, 2018. -Continued on vancomycin.  Needs it only for 48hrs.   -Further management per orthopedics.  Pain control.  Will likely need placement.  Looks like she did present from a skilled nursing facility so she would likely end up going back there.  Leukocytosis Likely reactive.  However urine culture is noted to be growing E. Coli.  Urinary tract infection Urine culture positive for E. coli.  Await final sensitivities.  Very difficult to know from this patient if she is symptomatic due to her history of dementia.  UA was significantly abnormal.  Urine culture from 11/18 was also positive for E. coli.  She was discharged on 11/20 with Keflex for 5 days.  However it appears that this infection has not been treated since repeat culture was also positive.  Will wait for final identification and sensitivities.  Hyperkalemia He was noted to be 5.8 and then 5.6.  Improved to 5.1 today.  She is not getting any potassium supplements.  Continue to monitor.   Chronic anemia Hemoglobin trending down.  Could be  due to operative loss.  No evidence of overt bleeding otherwise.  Recheck tomorrow.  Transfuse if hemoglobin is less than 7.    History of CVA -Continue home aspirin  Dementia -Continue home Aricept  Essential hypertension Blood pressure is been stable for the most part.  Elevated blood pressure could be reflective of pain.  Continue to monitor.  Continue metoprolol.    Hypothyroidism -Continue Synthroid  Full thickness wound: per wound cares discussion with pt daughter, they note that pt had surgical procedure for perirectal abscess in October and this is full thickness wound and not pressure injury.  Appreciate wound care recs  DVT prophylaxis: lovenox  Code Status: DNR Family Communication: No family noted at bedside. Disposition Plan: Wait on further orthopedic input.   Consultants:   ortho  Procedures:  11/25 ORIF right distal femur fracture, I&D distal thigh wound  Antimicrobials:  Anti-infectives (From admission, onward)   Start     Dose/Rate Route Frequency Ordered Stop   11/11/18 2300  vancomycin (VANCOCIN) IVPB 750 mg/150 ml premix  Status:  Discontinued     750 mg 150 mL/hr over 60 Minutes Intravenous Every 48 hours 11/09/18 2005 11/10/18 1644   11/11/18 2000  vancomycin (VANCOCIN) IVPB 750 mg/150 ml premix  Status:  Discontinued     750 mg 150 mL/hr over 60 Minutes Intravenous Every 48 hours 11/10/18 1705 11/10/18 1708   11/10/18 2300  ceFEPIme (MAXIPIME) 1 g in sodium chloride 0.9 % 100 mL IVPB  Status:  Discontinued  1 g 200 mL/hr over 30 Minutes Intravenous Every 24 hours 11/09/18 2005 11/10/18 1644   11/10/18 0300  ceFAZolin (ANCEF) IVPB 2g/100 mL premix     2 g 200 mL/hr over 30 Minutes Intravenous Every 6 hours 11/10/18 0110 11/10/18 0945   11/09/18 2011  vancomycin (VANCOCIN) 1-5 GM/200ML-% IVPB    Note to Pharmacy:  Benjamin Stain   : cabinet override      11/09/18 2011 11/09/18 2030   11/09/18 2000  ceFEPIme (MAXIPIME) 2 g in sodium chloride  0.9 % 100 mL IVPB     2 g 200 mL/hr over 30 Minutes Intravenous STAT 11/09/18 1946 11/09/18 2115   11/09/18 2000  vancomycin (VANCOCIN) IVPB 1000 mg/200 mL premix     1,000 mg 200 mL/hr over 60 Minutes Intravenous STAT 11/09/18 1946 11/09/18 2130   11/09/18 1645  doxycycline (VIBRA-TABS) tablet 100 mg     100 mg Oral  Once 11/09/18 1633 11/09/18 1707   11/09/18 1430  ceFAZolin (ANCEF) IVPB 1 g/50 mL premix     1 g 100 mL/hr over 30 Minutes Intravenous  Once 11/09/18 1418 11/09/18 1848     Subjective: Patient is awake alert.  Unable to answer questions appropriately due to her dementia.  Objective: Vitals:   11/10/18 1400 11/10/18 2132 11/10/18 2134 11/11/18 0508  BP: (!) 122/59 (!) 167/61 (!) 167/61 137/64  Pulse: 61 90 88 74  Resp:   15 18  Temp: (!) 97.3 F (36.3 C)  98.3 F (36.8 C) 98.1 F (36.7 C)  TempSrc: Oral  Oral Oral  SpO2: 98%  98% 100%  Weight:      Height:        Intake/Output Summary (Last 24 hours) at 11/11/2018 1211 Last data filed at 11/11/2018 1107 Gross per 24 hour  Intake 1227.96 ml  Output 1050 ml  Net 177.96 ml   Filed Weights   11/09/18 2317  Weight: 54.4 kg    Examination:  General exam: Awake alert.  In no distress Respiratory system: Normal effort at rest.  Clear to auscultation bilaterally Cardiovascular system: Normal regular.  No S3-S4.  No rubs murmurs or bruit Gastrointestinal system: Abdomen is soft nontender nondistended. Central nervous system: Alert.  No focal neurological deficits.  Distracted.  Disoriented.    Data Reviewed: I have personally reviewed following labs and imaging studies  CBC: Recent Labs  Lab 11/05/18 11/09/18 1424 11/10/18 0505 11/11/18 0503  WBC 7.7 12.5* 17.2* 14.9*  NEUTROABS  --  8.5*  --   --   HGB 10.9* 10.3* 9.4* 7.5*  HCT 33* 34.1* 31.4* 24.8*  MCV  --  91.2 91.8 90.5  PLT 194 283 288 025   Basic Metabolic Panel: Recent Labs  Lab 11/05/18 11/09/18 1424 11/10/18 0505 11/10/18 0938  11/11/18 0503  NA 141 140 138 137 142  K 4.4 4.8 5.8* 5.6* 5.1  CL  --  108 109 106 111  CO2  --  25 22 20* 21*  GLUCOSE  --  105* 181* 174* 115*  BUN 26* 32* 32* 32* 42*  CREATININE 1.0 1.01* 1.03* 1.00 1.05*  CALCIUM  --  9.3 8.9 8.8* 8.8*  MG  --   --   --   --  1.7   GFR: Estimated Creatinine Clearance: 25.7 mL/min (A) (by C-G formula based on SCr of 1.05 mg/dL (H)).  Coagulation Profile: Recent Labs  Lab 11/09/18 1424  INR 1.00    Recent Results (from the past 240 hour(s))  MRSA PCR Screening     Status: Abnormal   Collection Time: 11/02/18 11:34 AM  Result Value Ref Range Status   MRSA by PCR POSITIVE (A) NEGATIVE Final    Comment:        The GeneXpert MRSA Assay (FDA approved for NASAL specimens only), is one component of a comprehensive MRSA colonization surveillance program. It is not intended to diagnose MRSA infection nor to guide or monitor treatment for MRSA infections. RESULT CALLED TO, READ BACK BY AND VERIFIED WITH: TAYLOR,J @ 3151 ON [id number] BY POTEAT,S Performed at [facility], [place] [address] [place], [place] [address]   Urine Culture     Status: Abnormal   Collection Time: 11/02/18  5:39 PM  Result Value Ref Range Status   Specimen Description   Final    URINE, RANDOM Performed at [place] [address], [place], [place] [address]    Special Requests   Final    NONE Performed at [facility], [place] [address] [place], [place] [address]    Culture >=100,000 COLONIES/mL ESCHERICHIA COLI (A)  Final   Report Status 11/05/2018 FINAL  Final   Organism ID, Bacteria ESCHERICHIA COLI (A)  Final      Susceptibility   Escherichia coli - MIC*    AMPICILLIN >=32 RESISTANT Resistant     CEFAZOLIN <=4 SENSITIVE Sensitive     CEFTRIAXONE <=1 SENSITIVE Sensitive     CIPROFLOXACIN >=4 RESISTANT Resistant     GENTAMICIN <=1 SENSITIVE Sensitive     IMIPENEM <=0.25 SENSITIVE Sensitive      NITROFURANTOIN <=16 SENSITIVE Sensitive     TRIMETH/SULFA <=20 SENSITIVE Sensitive     AMPICILLIN/SULBACTAM <=2 SENSITIVE Sensitive     PIP/TAZO 8 SENSITIVE Sensitive     Extended ESBL NEGATIVE Sensitive     * >=100,000 COLONIES/mL ESCHERICHIA COLI  Urine Culture     Status: Abnormal (Preliminary result)   Collection Time: 11/09/18  9:19 PM  Result Value Ref Range Status   Specimen Description   Final    URINE, CATHETERIZED Performed at [place] [address], [place], [place] [address]    Special Requests   Final    NONE Performed at [facility], [place] [address], [place],  [address]    Culture >=100,000 COLONIES/mL ESCHERICHIA COLI (A)  Final   Report Status PENDING  Incomplete         Radiology Studies: Dg Tibia/fibula Right  Result Date: 11/09/2018 CLINICAL DATA:  Distal femur fracture.  Pain. EXAM: RIGHT TIBIA AND FIBULA - 2 VIEW COMPARISON:  11/09/2018 femur radiographs. FINDINGS: Distal femoral spiral fracture again noted, better characterized on the femur radiographs. This appears moderately angulated and with considerable overlap. Total knee prosthesis observed. No appreciable fracture of the tibia or fibula. IMPRESSION: 1. No tibial or fibular fracture is identified. 2. Notably displaced spiral fracture of the distal femur. Electronically Signed   By: Van Clines M.D.   On: 11/09/2018 18:10   Dg C-arm 1-60 Min-no Report  Result Date: 11/09/2018 Fluoroscopy was utilized by the requesting physician.  No radiographic interpretation.   Dg Femur, Min 2 Views Right  Result Date: 11/10/2018 CLINICAL DATA:  Intraoperative images.  Right femur fracture. EXAM: RIGHT FEMUR 2 VIEWS COMPARISON:  Preoperative femur radiographs earlier this day. FINDINGS: Three fluoroscopic spot views obtained in the operating room in frontal and lateral projections demonstrate cerclage wires fixating distal femur fracture. Fracture is in  improved anatomic  alignment. Right knee arthroplasty partially visualized. Total fluoroscopy time 9 seconds. Dose 0.70 mGy. IMPRESSION: Procedural fluoroscopy post cerclage wire fixation of distal femur fracture. Electronically Signed   By: Keith Rake M.D.   On: 11/10/2018 04:48   Dg Femur, Min 2 Views Right  Result Date: 11/09/2018 CLINICAL DATA:  84 y/o F; right femur fracture with bleeding at the site of the fracture. EXAM: RIGHT FEMUR 2 VIEWS COMPARISON:  10/31/2018 femur radiographs FINDINGS: Oblique acute fracture of the lower femoral diaphysis. One shaft's width medial displacement of the proximal fracture component. Increased overriding and shortening to 10 cm. The tip of the fracture now extends to the skin surface and may be open. Knee arthroplasty and right hip joints are maintained. IMPRESSION: Acute oblique lower femoral diaphysis fracture with increased overriding and shortening of approximately 10 cm. The tip of the proximal fracture component now extends to the skin surface and may be open. Electronically Signed   By: Kristine Garbe M.D.   On: 11/09/2018 15:51        Scheduled Meds: . aspirin EC  81 mg Oral Daily  . cycloSPORINE  1 drop Both Eyes BID  . docusate sodium  100 mg Oral BID  . donepezil  10 mg Oral QHS  . enoxaparin (LOVENOX) injection  30 mg Subcutaneous Daily  . ferrous sulfate  325 mg Oral TID PC  . levothyroxine  88 mcg Oral QAC breakfast  . metoprolol tartrate  37.5 mg Oral BID  . nutrition supplement (JUVEN)  1 packet Oral Daily  . saccharomyces boulardii  250 mg Oral BID  . Sodium Fluoride  1 application dental Daily  . zinc oxide  1 application Topical See admin instructions   Continuous Infusions: . sodium chloride 50 mL/hr at 11/10/18 2334  . methocarbamol (ROBAXIN) IV Stopped (11/09/18 2347)     LOS: 2 days    Bonnielee Haff, MD [facility] Pager [phone]  If 7PM-7AM, please contact  night-coverage [email or web address] Password [email or web address] 11/11/2018, 12:11 PM

## 2018-11-12 DIAGNOSIS — N390 Urinary tract infection, site not specified: Secondary | ICD-10-CM

## 2018-11-12 LAB — CBC
HEMATOCRIT: 23.5 % — AB (ref 36.0–46.0)
HEMOGLOBIN: 7 g/dL — AB (ref 12.0–15.0)
MCH: 27.6 pg (ref 26.0–34.0)
MCHC: 29.8 g/dL — ABNORMAL LOW (ref 30.0–36.0)
MCV: 92.5 fL (ref 80.0–100.0)
Platelets: 304 10*3/uL (ref 150–400)
RBC: 2.54 MIL/uL — AB (ref 3.87–5.11)
RDW: 17.8 % — ABNORMAL HIGH (ref 11.5–15.5)
WBC: 8.7 10*3/uL (ref 4.0–10.5)
nRBC: 0 % (ref 0.0–0.2)

## 2018-11-12 LAB — URINE CULTURE

## 2018-11-12 LAB — PREPARE RBC (CROSSMATCH)

## 2018-11-12 LAB — BASIC METABOLIC PANEL
Anion gap: 6 (ref 5–15)
BUN: 32 mg/dL — ABNORMAL HIGH (ref 8–23)
CO2: 21 mmol/L — AB (ref 22–32)
Calcium: 8.7 mg/dL — ABNORMAL LOW (ref 8.9–10.3)
Chloride: 113 mmol/L — ABNORMAL HIGH (ref 98–111)
Creatinine, Ser: 0.91 mg/dL (ref 0.44–1.00)
GFR calc non Af Amer: 55 mL/min — ABNORMAL LOW (ref >60)
Glucose, Bld: 85 mg/dL (ref 70–99)
Potassium: 4.3 mmol/L (ref 3.5–5.1)
Sodium: 140 mmol/L (ref 135–145)

## 2018-11-12 MED ORDER — FOSFOMYCIN TROMETHAMINE 3 G PO PACK
3.0000 g | PACK | Freq: Once | ORAL | Status: AC
  Administered 2018-11-12: 3 g via ORAL
  Filled 2018-11-12: qty 3

## 2018-11-12 MED ORDER — SODIUM CHLORIDE 0.9% IV SOLUTION: Freq: Once | INTRAVENOUS | Status: DC

## 2018-11-12 NOTE — Progress Notes (Signed)
Patient ID: Brittany [name], female   DOB: May 29, 1924, 84 y.o.   MRN: [id number] Subjective: 3 Days Post-Op Procedure(s) (LRB): INTRAMEDULLARY (IM) RETROGRADE FEMORAL NAILING (Right)    Patient more responsive this am.  No reported events  Objective:   VITALS:   Vitals:   11/11/18 2236 11/12/18 0620  BP: (!) 139/43 140/61  Pulse: 79 79  Resp: 16 12  Temp: 98.7 F (37.1 C) 98 F (36.7 C)  SpO2: 98% 98%    Incision: dressing C/D/I  LABS Recent Labs    11/10/18 0505 11/11/18 0503 11/12/18 0518  HGB 9.4* 7.5* 7.0*  HCT 31.4* 24.8* 23.5*  WBC 17.2* 14.9* 8.7  PLT 288 280 304    Recent Labs    11/10/18 0505 11/10/18 0938 11/11/18 0503  NA 138 137 142  K 5.8* 5.6* 5.1  BUN 32* 32* 42*  CREATININE 1.03* 1.00 1.05*  GLUCOSE 181* 174* 115*    Recent Labs    11/09/18 1424  INR 1.00     Assessment/Plan: 3 Days Post-Op Procedure(s) (LRB): INTRAMEDULLARY (IM) RETROGRADE FEMORAL NAILING (Right)  Plan: Will have nursing change dressing to Mepilex today Discharge pending [facility] RLE RTC in 2 weeks

## 2018-11-12 NOTE — Progress Notes (Signed)
CSW following for discharge needs to [facility]. Patient admitted from rehab. Patient will need PT/OT notes for insurance approval.  Kathrin Greathouse, Marlinda Mike, MSW Clinical Social Worker  [phone] 11/12/2018  10:33 AM

## 2018-11-12 NOTE — Progress Notes (Signed)
PROGRESS NOTE    Brittany [name]  CHE:[id number] DOB: February 07, 1924 DOA: 11/09/2018 PCP: System, Pcp Not In   Brief Narrative:  Brittany [name] is a 84 y.o. female with medical history significant of CVA, dementia, hypertension, hyperlipidemia, hypothyroidism, osteoporosis presenting from her nursing home for evaluation of bleeding at the site of recent femur fracture.  History of closed distal right femur fracture on October 31, 2018.  Was decided on nonoperative management with Nemo immobilizer at the time.  Nursing facility noticed bleeding outside of patient's ACE wrap and she was sent to the ED for evaluation.  Patient has baseline dementia and no history could be obtained from her.    Patient was seen by orthopedics and underwent surgery on 11/26.   Assessment & Plan:   Right Grade 1 Open Distal Periprosthetic Femur Fracture:  - Status post ORIF and I&D of right distal thigh wound on November 09, 2018. -She received vancomycin for 48 hours. -Orthopedics is following.  Nonweightbearing on the right leg.  PT and OT evaluation.  Will likely need to go back to her skilled nursing facility.    Leukocytosis Likely reactive.  However urine culture is noted to be growing E. Coli.  WBC normal today.  Urinary tract infection Urine culture positive for E. coli.  Very difficult to know from this patient if she is symptomatic due to her history of dementia.  UA was significantly abnormal.  Urine culture from 11/18 was also positive for E. coli.  She was discharged on 11/20 with Keflex for 5 days.  However it appears that this infection has not been treated since repeat culture was also positive.  Final identification available.  E. coli is ESBL.  Will treat with fosfomycin.  Hyperkalemia Potassium was noted to be 5.8 and then 5.6.  Improved to 5.1 yesterday and normal today.  Reason for elevated potassium level not clear.  Check periodically.  She is not getting any potassium supplements.   Chronic  anemia Hemoglobin trending down.  Could be due to operative loss.  No evidence of overt bleeding otherwise.  Hemoglobin noted to be 7.0 this morning.  1 unit of PRBC to be ordered.   History of CVA -Continue home aspirin  Dementia -Continue home Aricept  Essential hypertension Blood pressure is been stable for the most part.  Elevated blood pressure could be reflective of pain.  Continue to monitor.  Continue metoprolol.    Hypothyroidism -Continue Synthroid  Full thickness wound: per wound cares discussion with pt daughter, they note that pt had surgical procedure for perirectal abscess in October and this is full thickness wound and not pressure injury.  Appreciate wound care recs  DVT prophylaxis: lovenox  Code Status: DNR Family Communication: No family noted at bedside. Disposition Plan: PT and OT evaluation.  Blood transfusion today.  Fosfomycin x1.   Consultants:   ortho  Procedures:  11/25 ORIF right distal femur fracture, I&D distal thigh wound  Antimicrobials:  Anti-infectives (From admission, onward)   Start     Dose/Rate Route Frequency Ordered Stop   11/11/18 2300  vancomycin (VANCOCIN) IVPB 750 mg/150 ml premix  Status:  Discontinued     750 mg 150 mL/hr over 60 Minutes Intravenous Every 48 hours 11/09/18 2005 11/10/18 1644   11/11/18 2000  vancomycin (VANCOCIN) IVPB 750 mg/150 ml premix  Status:  Discontinued     750 mg 150 mL/hr over 60 Minutes Intravenous Every 48 hours 11/10/18 1705 11/10/18 1708   11/10/18 2300  ceFEPIme (MAXIPIME)  1 g in sodium chloride 0.9 % 100 mL IVPB  Status:  Discontinued     1 g 200 mL/hr over 30 Minutes Intravenous Every 24 hours 11/09/18 2005 11/10/18 1644   11/10/18 0300  ceFAZolin (ANCEF) IVPB 2g/100 mL premix     2 g 200 mL/hr over 30 Minutes Intravenous Every 6 hours 11/10/18 0110 11/10/18 0945   11/09/18 2011  vancomycin (VANCOCIN) 1-5 GM/200ML-% IVPB    Note to Pharmacy:  Benjamin Stain   : cabinet override       11/09/18 2011 11/09/18 2030   11/09/18 2000  ceFEPIme (MAXIPIME) 2 g in sodium chloride 0.9 % 100 mL IVPB     2 g 200 mL/hr over 30 Minutes Intravenous STAT 11/09/18 1946 11/09/18 2115   11/09/18 2000  vancomycin (VANCOCIN) IVPB 1000 mg/200 mL premix     1,000 mg 200 mL/hr over 60 Minutes Intravenous STAT 11/09/18 1946 11/09/18 2130   11/09/18 1645  doxycycline (VIBRA-TABS) tablet 100 mg     100 mg Oral  Once 11/09/18 1633 11/09/18 1707   11/09/18 1430  ceFAZolin (ANCEF) IVPB 1 g/50 mL premix     1 g 100 mL/hr over 30 Minutes Intravenous  Once 11/09/18 1418 11/09/18 1848     Subjective: Patient is much more awake alert.  Remains distracted and confused.  Does not answer questions apart from saying that she feels well.  Objective: Vitals:   11/11/18 1333 11/11/18 2236 11/12/18 0620 11/12/18 1059  BP: 134/62 (!) 139/43 140/61 140/60  Pulse: 81 79 79 (!) 56  Resp: 14 16 12 16   Temp: 98.4 F (36.9 C) 98.7 F (37.1 C) 98 F (36.7 C) 97.9 F (36.6 C)  TempSrc: Oral Oral Oral Oral  SpO2: 100% 98% 98% 97%  Weight:      Height:        Intake/Output Summary (Last 24 hours) at 11/12/2018 1117 Last data filed at 11/12/2018 0831 Gross per 24 hour  Intake 720 ml  Output 1100 ml  Net -380 ml   Filed Weights   11/09/18 2317  Weight: 54.4 kg    Examination:  General exam: Awake alert.  Distracted.  In no distress Respiratory system: Normal effort at rest.  Clear to auscultation bilaterally Cardiovascular system: S1-S2 is normal regular.  No S3-S4 Gastrointestinal system: Abdomen is soft.  Nontender nondistended Right lower extremity is in an immobilizer Central nervous system: Patient is awake alert.  Disoriented.  No obvious focal neurological deficits.    Data Reviewed: I have personally reviewed following labs and imaging studies  CBC: Recent Labs  Lab 11/09/18 1424 11/10/18 0505 11/11/18 0503 11/12/18 0518  WBC 12.5* 17.2* 14.9* 8.7  NEUTROABS 8.5*  --   --   --    HGB 10.3* 9.4* 7.5* 7.0*  HCT 34.1* 31.4* 24.8* 23.5*  MCV 91.2 91.8 90.5 92.5  PLT 283 288 280 562   Basic Metabolic Panel: Recent Labs  Lab 11/09/18 1424 11/10/18 0505 11/10/18 0938 11/11/18 0503 11/12/18 0518  NA 140 138 137 142 140  K 4.8 5.8* 5.6* 5.1 4.3  CL 108 109 106 111 113*  CO2 25 22 20* 21* 21*  GLUCOSE 105* 181* 174* 115* 85  BUN 32* 32* 32* 42* 32*  CREATININE 1.01* 1.03* 1.00 1.05* 0.91  CALCIUM 9.3 8.9 8.8* 8.8* 8.7*  MG  --   --   --  1.7  --    GFR: Estimated Creatinine Clearance: 29.7 mL/min (by C-G formula based on  SCr of 0.91 mg/dL).  Coagulation Profile: Recent Labs  Lab 11/09/18 1424  INR 1.00    Recent Results (from the past 240 hour(s))  MRSA PCR Screening     Status: Abnormal   Collection Time: 11/02/18 11:34 AM  Result Value Ref Range Status   MRSA by PCR POSITIVE (A) NEGATIVE Final    Comment:        The GeneXpert MRSA Assay (FDA approved for NASAL specimens only), is one component of a comprehensive MRSA colonization surveillance program. It is not intended to diagnose MRSA infection nor to guide or monitor treatment for MRSA infections. RESULT CALLED TO, READ BACK BY AND VERIFIED WITH: TAYLOR,J @ 2297 ON [id number] BY POTEAT,S Performed at [facility], [place] [address], [place], [place] [address]   Urine Culture     Status: Abnormal   Collection Time: 11/02/18  5:39 PM  Result Value Ref Range Status   Specimen Description   Final    URINE, RANDOM Performed at [place] [address] [place], [place] [address]    Special Requests   Final    NONE Performed at [facility], [place] [address], [place], [place] [address]    Culture >=100,000 COLONIES/mL ESCHERICHIA COLI (A)  Final   Report Status 11/05/2018 FINAL  Final   Organism ID, Bacteria ESCHERICHIA COLI (A)  Final      Susceptibility   Escherichia coli - MIC*    AMPICILLIN >=32 RESISTANT Resistant      CEFAZOLIN <=4 SENSITIVE Sensitive     CEFTRIAXONE <=1 SENSITIVE Sensitive     CIPROFLOXACIN >=4 RESISTANT Resistant     GENTAMICIN <=1 SENSITIVE Sensitive     IMIPENEM <=0.25 SENSITIVE Sensitive     NITROFURANTOIN <=16 SENSITIVE Sensitive     TRIMETH/SULFA <=20 SENSITIVE Sensitive     AMPICILLIN/SULBACTAM <=2 SENSITIVE Sensitive     PIP/TAZO 8 SENSITIVE Sensitive     Extended ESBL NEGATIVE Sensitive     * >=100,000 COLONIES/mL ESCHERICHIA COLI  Urine Culture     Status: Abnormal   Collection Time: 11/09/18  9:19 PM  Result Value Ref Range Status   Specimen Description   Final    URINE, CATHETERIZED Performed at [place] [address] [place], [place] [address]    Special Requests   Final    NONE Performed at [facility], [place] [address] [place], [place] [address]    Culture (A)  Final    >=100,000 COLONIES/mL ESCHERICHIA COLI Confirmed Extended Spectrum Beta-Lactamase Producer (ESBL).  In bloodstream infections from ESBL organisms, carbapenems are preferred over piperacillin/tazobactam. They are shown to have a lower risk of mortality.    Report Status 11/12/2018 FINAL  Final   Organism ID, Bacteria ESCHERICHIA COLI (A)  Final      Susceptibility   Escherichia coli - MIC*    AMPICILLIN >=32 RESISTANT Resistant     CEFAZOLIN >=64 RESISTANT Resistant     CEFTRIAXONE >=64 RESISTANT Resistant     CIPROFLOXACIN >=4 RESISTANT Resistant     GENTAMICIN <=1 SENSITIVE Sensitive     IMIPENEM <=0.25 SENSITIVE Sensitive     NITROFURANTOIN <=16 SENSITIVE Sensitive     TRIMETH/SULFA <=20 SENSITIVE Sensitive     AMPICILLIN/SULBACTAM >=32 RESISTANT Resistant     PIP/TAZO >=128 RESISTANT Resistant     Extended ESBL POSITIVE Resistant     * >=100,000 COLONIES/mL ESCHERICHIA COLI         Radiology Studies: No  results found.      Scheduled Meds: . sodium chloride   Intravenous Once  . aspirin EC  81 mg Oral Daily  . cycloSPORINE  1  drop Both Eyes BID  . docusate sodium  100 mg Oral BID  . donepezil  10 mg Oral QHS  . enoxaparin (LOVENOX) injection  30 mg Subcutaneous Daily  . ferrous sulfate  325 mg Oral TID PC  . levothyroxine  88 mcg Oral QAC breakfast  . metoprolol tartrate  37.5 mg Oral BID  . nutrition supplement (JUVEN)  1 packet Oral Daily  . saccharomyces boulardii  250 mg Oral BID  . Sodium Fluoride  1 application dental Daily  . zinc oxide  1 application Topical See admin instructions   Continuous Infusions: . methocarbamol (ROBAXIN) IV Stopped (11/09/18 2347)     LOS: 3 days    Bonnielee Haff, MD [facility] Pager [phone]  If 7PM-7AM, please contact night-coverage [email or web address] Password [facility] 11/12/2018, 11:17 AM

## 2018-11-12 NOTE — Plan of Care (Signed)
Plan of care reviewed and discussed with patient's daughter. ?

## 2018-11-12 NOTE — Evaluation (Signed)
Physical Therapy Evaluation Patient Details Name: Brittany [name] MRN: [id number] DOB: 06-12-1924 Today's Date: 11/12/2018   History of Present Illness   84 y.o. female from SNF, with medical history significant for vascular dementia, hyperlipidemia, hypertension, hypothyroidism, history of myelodysplastic syndrome and pancytopenia, admitted with recent Dx of R periprosthetic distal femur fx, s/p closed reduction and non-operative management. Found to have bleeding at fracture site with bone extending to skin. On November 09, 2018 underwent for ORIF of right distal femur fracture and I&D of right distal thigh wound.   Clinical Impression  Patient presents with decreased mobility due to pain, NWB, decreased strength and decreased balance.  She will benefit from skilled PT in the acute setting to allow return to SNF level rehab upon d/c.  Currently max to total A +2 for mobility at EOB.  She tolerated sitting EOB with S and UE support for several minutes.  Deferred OOB today, but feel she can participate with transfers with assist for NWB R LE.    Follow Up Recommendations SNF;Supervision/Assistance - 24 hour    Equipment Recommendations  None recommended by PT    Recommendations for Other Services       Precautions / Restrictions Precautions Precautions: Fall Required Braces or Orthoses: Knee Immobilizer - Right Knee Immobilizer - Right: On at all times Restrictions Weight Bearing Restrictions: Yes RLE Weight Bearing: Non weight bearing      Mobility  Bed Mobility Overal bed mobility: Needs Assistance Bed Mobility: Supine to Sit;Sit to Supine     Supine to sit: Max assist;+2 for physical assistance Sit to supine: Total assist;+2 for physical assistance   General bed mobility comments: cues for pt to use L LE to assist with scooting to EOB, assist to lift trunk; to supine assist for trunk and legs and to scoot up in bed  Transfers Overall transfer level: Needs  assistance Equipment used: None Transfers: Lateral/Scoot Transfers          Lateral/Scoot Transfers: Max assist;+2 physical assistance General transfer comment: tech assisted with keeping R LE NWB, assist to use L LE to scoot up to [facility] x 2  Ambulation/Gait                Stairs            Wheelchair Mobility    Modified Rankin (Stroke Patients Only)       Balance Overall balance assessment: Needs assistance Sitting-balance support: Bilateral upper extremity supported;Feet supported Sitting balance-Leahy Scale: Poor Sitting balance - Comments: reliant on UE support for balance at EOB     Standing balance-Leahy Scale: Zero                               Pertinent Vitals/Pain Pain Assessment: Faces Faces Pain Scale: Hurts even more Pain Location: R leg with sitting EOB Pain Descriptors / Indicators: Guarding;Moaning Pain Intervention(s): Monitored during session;Repositioned;Limited activity within patient's tolerance    Home Living Family/patient expects to be discharged to:: Skilled nursing facility                 Additional Comments: [place]    Prior Function Level of Independence: Needs assistance   Gait / Transfers Assistance Needed: Prior to September 2019 admission was able to ambulate to the dining room and toilet herself.  She has been receiving PT and was able to walk 58' with mod assist with RW until fall about a week ago  Hand Dominance   Dominant Hand: Right    Extremity/Trunk Assessment   Upper Extremity Assessment Upper Extremity Assessment: Generalized weakness    Lower Extremity Assessment Lower Extremity Assessment: RLE deficits/detail RLE Deficits / Details: kept knee immobilizer on during session, assist to move leg to EOB, ankle AROM about 10 degrees from neutral RLE: Unable to fully assess due to immobilization    Cervical / Trunk Assessment Cervical / Trunk Assessment: Kyphotic   Communication   Communication: HOH  Cognition Arousal/Alertness: Awake/alert Behavior During Therapy: WFL for tasks assessed/performed Overall Cognitive Status: History of cognitive impairments - at baseline                                        General Comments General comments (skin integrity, edema, etc.): daughter and son in law in room during session providing recent history    Exercises General Exercises - Lower Extremity Ankle Circles/Pumps: AROM;10 reps;Both;Supine   Assessment/Plan    PT Assessment Patient needs continued PT services  PT Problem List Decreased strength;Decreased mobility;Decreased knowledge of precautions;Decreased activity tolerance;Decreased balance;Pain       PT Treatment Interventions DME instruction;Gait training;Therapeutic exercise;Therapeutic activities;Functional mobility training;Balance training;Patient/family education    PT Goals (Current goals can be found in the Care Plan section)  Acute Rehab PT Goals Patient Stated Goal: return to [facility] PT Goal Formulation: With patient/family Time For Goal Achievement: 11/26/18 Potential to Achieve Goals: Fair    Frequency Min 3X/week   Barriers to discharge        Co-evaluation               AM-PAC PT "6 Clicks" Mobility  Outcome Measure Help needed turning from your back to your side while in a flat bed without using bedrails?: A Lot Help needed moving from lying on your back to sitting on the side of a flat bed without using bedrails?: Total Help needed moving to and from a bed to a chair (including a wheelchair)?: Total Help needed standing up from a chair using your arms (e.g., wheelchair or bedside chair)?: Total Help needed to walk in hospital room?: Total Help needed climbing 3-5 steps with a railing? : Total 6 Click Score: 7    End of Session Equipment Utilized During Treatment: Gait belt Activity Tolerance: Patient limited by pain Patient  left: with call bell/phone within reach;in bed;with family/visitor present;with bed alarm set;with nursing/sitter in room   PT Visit Diagnosis: History of falling (Z91.81);Muscle weakness (generalized) (M62.81);Difficulty in walking, not elsewhere classified (R26.2);Pain Pain - Right/Left: Right Pain - part of body: Knee    Time: 1210-1231 PT Time Calculation (min) (ACUTE ONLY): 21 min   Charges:   PT Evaluation $PT Eval Moderate Complexity: [place], [place] [facility] [phone] 11/12/2018   Reginia Naas 11/12/2018, 1:39 PM

## 2018-11-13 LAB — CBC
HCT: 31 % — ABNORMAL LOW (ref 36.0–46.0)
Hemoglobin: 9.9 g/dL — ABNORMAL LOW (ref 12.0–15.0)
MCH: 28.8 pg (ref 26.0–34.0)
MCHC: 31.9 g/dL (ref 30.0–36.0)
MCV: 90.1 fL (ref 80.0–100.0)
NRBC: 0 % (ref 0.0–0.2)
Platelets: 337 10*3/uL (ref 150–400)
RBC: 3.44 MIL/uL — AB (ref 3.87–5.11)
RDW: 17.2 % — ABNORMAL HIGH (ref 11.5–15.5)
WBC: 9.6 10*3/uL (ref 4.0–10.5)

## 2018-11-13 LAB — BASIC METABOLIC PANEL
Anion gap: 8 (ref 5–15)
BUN: 24 mg/dL — ABNORMAL HIGH (ref 8–23)
CO2: 24 mmol/L (ref 22–32)
Calcium: 8.8 mg/dL — ABNORMAL LOW (ref 8.9–10.3)
Chloride: 107 mmol/L (ref 98–111)
Creatinine, Ser: 0.78 mg/dL (ref 0.44–1.00)
GFR calc Af Amer: >60 mL/min (ref >60)
GFR calc non Af Amer: >60 mL/min (ref >60)
Glucose, Bld: 94 mg/dL (ref 70–99)
POTASSIUM: 4.2 mmol/L (ref 3.5–5.1)
Sodium: 139 mmol/L (ref 135–145)

## 2018-11-13 LAB — TYPE AND SCREEN
ABO/RH(D): O POS
Antibody Screen: NEGATIVE
Unit division: 0

## 2018-11-13 LAB — BPAM RBC
Blood Product Expiration Date: 201912232359
ISSUE DATE / TIME: 201911281055
Unit Type and Rh: 5100

## 2018-11-13 MED ORDER — FERROUS SULFATE 325 (65 FE) MG PO TABS: 325.0000 mg | ORAL_TABLET | Freq: Three times a day (TID) | ORAL | 3 refills | Status: AC

## 2018-11-13 MED ORDER — FOSFOMYCIN TROMETHAMINE 3 G PO PACK: 3.0000 g | PACK | Freq: Once | ORAL | 0 refills | Status: DC

## 2018-11-13 MED ORDER — DOCUSATE SODIUM 100 MG PO CAPS: 100.0000 mg | ORAL_CAPSULE | Freq: Two times a day (BID) | ORAL | 0 refills | Status: AC

## 2018-11-13 MED ORDER — SACCHAROMYCES BOULARDII 250 MG PO CAPS: 250.0000 mg | ORAL_CAPSULE | Freq: Two times a day (BID) | ORAL | 0 refills | Status: AC

## 2018-11-13 NOTE — NC FL2 (Signed)
[place] LEVEL OF CARE SCREENING TOOL     IDENTIFICATION  Patient Name: Brittany [name] Birthdate: [birth year] Sex: female Admission Date (Current Location): 11/09/2018  [facility] and [place] Number:  Engineering geologist and Address:  [facility],  [place] [address] [place]      Provider Number: [id number]  Attending Physician Name and Address:  Bonnielee Haff, MD  Relative Name and Phone Number:       Current Level of Care: Hospital Recommended Level of Care: [place] Prior Approval Number:    Date Approved/Denied:   PASRR Number: [id number]  Discharge Plan: SNF    Current Diagnoses: Patient Active Problem List   Diagnosis Date Noted  . Anemia 11/10/2018  . Open right femoral fracture ([place]) 11/09/2018  . Pressure injury of skin 11/04/2018  . Femur fracture, right ([place]) 10/31/2018  . Pancytopenia ([place]) 09/24/2018  . Palliative care encounter   . Perirectal inflammation 09/20/2018  . Leukocytosis 08/15/2018  . Weight loss 07/28/2018  . Actinic keratosis 06/26/2018  . Diastolic CHF ([place]) 93/79/0240  . History of CVA (cerebrovascular accident) 08/08/2017  . CKD (chronic kidney disease) stage 3, GFR 30-59 ml/min (HCC) 08/08/2017  . Anemia, iron deficiency 01/11/2016  . Unstable gait   . Shoulder pain   . History of melanoma 03/20/2015  . Constipation 07/18/2014  . Vascular dementia without behavioral disturbance ([place]) 11/11/2012  . Stroke ([place]) 12/17/2011  . Back pain 04/23/2011  . COLONIC POLYPS, HX OF 06/05/2009  . Myelodysplastic syndrome ([place]) 01/02/2009  . Hypothyroidism 08/21/2007  . Hyperlipidemia 08/21/2007  . Essential hypertension 08/21/2007  . DIVERTICULOSIS, COLON 08/21/2007  . OSTEOPOROSIS 08/21/2007  . SKIN CANCER, HX OF 08/21/2007    Orientation RESPIRATION BLADDER Height & Weight     Self  Normal Indwelling catheter, Incontinent Weight: 120 lb (54.4 kg) Height:  4' 11.02" (149.9 cm)   BEHAVIORAL SYMPTOMS/MOOD NEUROLOGICAL BOWEL NUTRITION STATUS      Incontinent Diet  AMBULATORY STATUS COMMUNICATION OF NEEDS Skin   Extensive Assist Verbally Surgical wounds(Knee, Pressure Injury Sacrum)                       Personal Care Assistance Level of Assistance  Bathing, Feeding, Dressing Bathing Assistance: Maximum assistance Feeding assistance: Limited assistance Dressing Assistance: Maximum assistance     Functional Limitations Info  Sight, Hearing, Speech Sight Info: Adequate Hearing Info: Adequate Speech Info: Adequate    SPECIAL CARE FACTORS FREQUENCY  PT (By licensed PT), OT (By licensed OT)     PT Frequency: 5x/week OT Frequency: 5x/week            Contractures Contractures Info: Not present    Additional Factors Info  Code Status, Allergies, Psychotropic Code Status Info: DNR Allergies Info: Allergies: Lisinopril, Penicillins           Current Medications (11/13/2018):  This is the current hospital active medication list Current Facility-Administered Medications  Medication Dose Route Frequency Provider Last Rate Last Dose  . 0.9 %  sodium chloride infusion (Manually program via Guardrails IV Fluids)   Intravenous Once Bonnielee Haff, MD      . aspirin EC tablet 81 mg  81 mg Oral Daily Shela Leff, MD   81 mg at 11/12/18 0852  . cycloSPORINE (RESTASIS) 0.05 % ophthalmic emulsion 1 drop  1 drop Both Eyes BID Shela Leff, MD   1 drop at 11/12/18 2300  . docusate sodium (COLACE) capsule 100 mg  100  mg Oral BID Danae Orleans, PA-C   100 mg at 11/12/18 2259  . donepezil (ARICEPT) tablet 10 mg  10 mg Oral QHS Shela Leff, MD   10 mg at 11/12/18 2249  . enoxaparin (LOVENOX) injection 30 mg  30 mg Subcutaneous Daily Danae Orleans, PA-C   30 mg at 11/12/18 0851  . ferrous sulfate tablet 325 mg  325 mg Oral TID PC Danae Orleans, PA-C   325 mg at 11/12/18 1353  . HYDROmorphone (DILAUDID) injection 0.5 mg  0.5 mg Intravenous  Q4H PRN Elodia Florence., MD   0.5 mg at 11/11/18 0510  . levothyroxine (SYNTHROID, LEVOTHROID) tablet 88 mcg  88 mcg Oral QAC breakfast Shela Leff, MD   88 mcg at 11/13/18 0507  . menthol-cetylpyridinium (CEPACOL) lozenge 3 mg  1 lozenge Oral PRN Danae Orleans, PA-C       Or  . phenol (CHLORASEPTIC) mouth spray 1 spray  1 spray Mouth/Throat PRN Babish, Matthew, PA-C      . methocarbamol (ROBAXIN) tablet 500 mg  500 mg Oral Q6H PRN Danae Orleans, PA-C       Or  . methocarbamol (ROBAXIN) 500 mg in dextrose 5 % 50 mL IVPB  500 mg Intravenous Q6H PRN Danae Orleans, PA-C   Stopped at 11/09/18 2347  . metoCLOPramide (REGLAN) tablet 5-10 mg  5-10 mg Oral Q8H PRN Danae Orleans, PA-C       Or  . metoCLOPramide (REGLAN) injection 5-10 mg  5-10 mg Intravenous Q8H PRN Babish, Matthew, PA-C      . metoprolol tartrate (LOPRESSOR) tablet 37.5 mg  37.5 mg Oral BID Shela Leff, MD   37.5 mg at 11/12/18 2249  . nutrition supplement (JUVEN) (JUVEN) powder packet 1 packet  1 packet Oral Daily Shela Leff, MD   1 packet at 11/10/18 [phone]  . ondansetron (ZOFRAN) tablet 4 mg  4 mg Oral Q6H PRN Danae Orleans, PA-C       Or  . ondansetron [facility]) injection 4 mg  4 mg Intravenous Q6H PRN Danae Orleans, PA-C      . oxyCODONE (Oxy IR/ROXICODONE) immediate release tablet 2.5 mg  2.5 mg Oral Q4H PRN Elodia Florence., MD      . polyethylene glycol (MIRALAX / GLYCOLAX) packet 17 g  17 g Oral Daily PRN Shela Leff, MD   17 g at 11/12/18 0851  . polyvinyl alcohol (LIQUIFILM TEARS) 1.4 % ophthalmic solution 1 drop  1 drop Both Eyes Q2H PRN Shela Leff, MD      . saccharomyces boulardii (FLORASTOR) capsule 250 mg  250 mg Oral BID Shela Leff, MD   250 mg at 11/12/18 2249  . Sodium Fluoride 1.1 % PSTE 1 application  1 application dental Daily Shela Leff, MD      . zinc oxide 20 % ointment 1 application  1 application Topical See admin instructions Shela Leff, MD         Discharge Medications: Please see discharge summary for a list of discharge medications.  Relevant Imaging Results:  Relevant Lab Results:   Additional Information SSN: [id number]  Lia Hopping, LCSW

## 2018-11-13 NOTE — Discharge Summary (Addendum)
[facility]  Physician Discharge Summary   Patient ID: Brittany [name] MRN: [id number] DOB/AGE: 1924-10-14 84 y.o.  Admit date: 11/09/2018 Discharge date: 11/13/2018  PCP: System, Pcp Not In  DISCHARGE DIAGNOSES:  Right distal periprosthetic femur fracture status post ORIF Urinary tract infection with the ESBL E. Coli Anemia likely due to acute blood loss, improved History of stroke History of dementia Essential hypertension Hypothyroidism   RECOMMENDATIONS FOR OUTPATIENT FOLLOW UP: 1. Check CBC and basic metabolic panel early next week 2. Patient to get 1 dose of fosfomycin on 11/16/2018 as discussed below 3. Follow-up with orthopedics 4. Patient to go with foley. Voiding trial at SNF once patient more active.   DISCHARGE CONDITION: fair  Diet recommendation: Regular as tolerated  Filed Weights   11/09/18 2317  Weight: 54.4 kg    INITIAL HISTORY: Brittany [name] a 84 y.o.femalewith medical history significant ofCVA, dementia, hypertension, hyperlipidemia, hypothyroidism, osteoporosis presenting from her nursing home for evaluation of bleeding at the site of recent femur fracture. History of closed distal right femur fracture on October 31, 2018. Was decided on nonoperative management with Nemo immobilizer at the time. Nursing facility noticed bleeding outside of patient's ACE wrapand she was sent to the ED for evaluation.Patient has baseline dementia and no history could be obtained from her.   Patient was seen by orthopedics and underwent surgery on 11/26.  Consultants:   ortho  Procedures:  11/25 ORIF right distal femur fracture, I&D distal thigh wound   HOSPITAL COURSE:   Right Grade 1 Open Distal Periprosthetic Femur Fracture:  -Patient was seen by orthopedics.  She is satuspost ORIF and I&D of right distal thigh wound on November 09, 2018. -She received vancomycin for 48 hours. -Per orthopedics she needs to be nonweightbearing on the  right leg.    She was seen by physical therapy.  She will go back to her skilled nursing facility.    Leukocytosis Likely reactive.  However urine culture is noted to be growing E. Coli.    WBC subsequently normal.  Urinary tract infection Urine culture positive for E. coli.  Very difficult to know from this patient if she is symptomatic due to her history of dementia.  UA was significantly abnormal.  Urine culture from 11/18 was also positive for E. coli.  She was discharged on 11/20 with Keflex for 5 days.  However it appears that this infection has not been treated since repeat culture was also positive.  Final identification available.  E. coli is ESBL.    She was given 1 dose of fosfomycin on 11/28.  She will be written for another dose on 12/2 which can be given at the skilled nursing facility. There was no evidence for sepsis during this hospitalization.  Hyperkalemia Potassium was noted to be 5.8 at admission.  It has trended down on its own and is normal at 4.2 today.  Reason for elevated potassium is not clear.  Will recommend rechecking her electrolytes next week.    Chronic anemia with acute blood loss Hemoglobin was noted to be trending down after her surgery.  Perhaps she had some operative loss.  Patient was transfused 1 unit of blood on 11/28.  Hemoglobin is up to 9.9 today.   History of CVA -Continue home aspirin  Dementia -Continue home Aricept  Essential hypertension Blood pressure is been stable for the most part.  Elevated blood pressure was secondary to pain.  Continue with home medications.   Hypothyroidism -Continue Synthroid  Full thickness wound:  per wound cares discussion with pt daughter, they note that pt had surgical procedure for perirectal abscess in October and this is full thickness wound and not pressure injury.  Appreciate wound care recs.  Overall stable.  Okay for discharge to her skilled nursing facility today.     PERTINENT  LABS:  The results of significant diagnostics from this hospitalization (including imaging, microbiology, ancillary and laboratory) are listed below for reference.    Microbiology: Recent Results (from the past 240 hour(s))  Urine Culture     Status: Abnormal   Collection Time: 11/09/18  9:19 PM  Result Value Ref Range Status   Specimen Description   Final    URINE, CATHETERIZED Performed at [facility] [address], [place], [place] [address]    Special Requests   Final    NONE Performed at [facility], [place] [address], [place], [place] [address]    Culture (A)  Final    >=100,000 COLONIES/mL ESCHERICHIA COLI Confirmed Extended Spectrum Beta-Lactamase Producer (ESBL).  In bloodstream infections from ESBL organisms, carbapenems are preferred over piperacillin/tazobactam. They are shown to have a lower risk of mortality.    Report Status 11/12/2018 FINAL  Final   Organism ID, Bacteria ESCHERICHIA COLI (A)  Final      Susceptibility   Escherichia coli - MIC*    AMPICILLIN >=32 RESISTANT Resistant     CEFAZOLIN >=64 RESISTANT Resistant     CEFTRIAXONE >=64 RESISTANT Resistant     CIPROFLOXACIN >=4 RESISTANT Resistant     GENTAMICIN <=1 SENSITIVE Sensitive     IMIPENEM <=0.25 SENSITIVE Sensitive     NITROFURANTOIN <=16 SENSITIVE Sensitive     TRIMETH/SULFA <=20 SENSITIVE Sensitive     AMPICILLIN/SULBACTAM >=32 RESISTANT Resistant     PIP/TAZO >=128 RESISTANT Resistant     Extended ESBL POSITIVE Resistant     * >=100,000 COLONIES/mL ESCHERICHIA COLI     Labs: Basic Metabolic Panel: Recent Labs  Lab 11/10/18 0505 11/10/18 0938 11/11/18 0503 11/12/18 0518 11/13/18 0514  NA 138 137 142 140 139  K 5.8* 5.6* 5.1 4.3 4.2  CL 109 106 111 113* 107  CO2 22 20* 21* 21* 24  GLUCOSE 181* 174* 115* 85 94  BUN 32* 32* 42* 32* 24*  CREATININE 1.03* 1.00 1.05* 0.91 0.78  CALCIUM 8.9 8.8* 8.8* 8.7* 8.8*  MG  --   --  1.7  --   --     CBC: Recent Labs  Lab 11/09/18 1424 11/10/18 0505 11/11/18 0503 11/12/18 0518 11/13/18 0514  WBC 12.5* 17.2* 14.9* 8.7 9.6  NEUTROABS 8.5*  --   --   --   --   HGB 10.3* 9.4* 7.5* 7.0* 9.9*  HCT 34.1* 31.4* 24.8* 23.5* 31.0*  MCV 91.2 91.8 90.5 92.5 90.1  PLT 283 288 280 304 337   BNP: BNP (last 3 results) Recent Labs    10/31/18 1738  BNP 64.7     IMAGING STUDIES Dg Elbow Complete Right  Result Date: 10/31/2018 CLINICAL DATA:  Lateral right elbow pain and abrasions following a fall today. EXAM: RIGHT ELBOW - COMPLETE 3+ VIEW COMPARISON:  None. FINDINGS: There is no evidence of fracture, dislocation, or joint effusion. There is no evidence of arthropathy or other focal bone abnormality. Soft tissues are unremarkable. IMPRESSION: No fracture or dislocation. Electronically Signed   By: Claudie Revering M.D.   On: 10/31/2018 15:20   Dg Tibia/fibula Right  Result Date: 11/09/2018 CLINICAL DATA:  Distal  femur fracture.  Pain. EXAM: RIGHT TIBIA AND FIBULA - 2 VIEW COMPARISON:  11/09/2018 femur radiographs. FINDINGS: Distal femoral spiral fracture again noted, better characterized on the femur radiographs. This appears moderately angulated and with considerable overlap. Total knee prosthesis observed. No appreciable fracture of the tibia or fibula. IMPRESSION: 1. No tibial or fibular fracture is identified. 2. Notably displaced spiral fracture of the distal femur. Electronically Signed   By: Van Clines M.D.   On: 11/09/2018 18:10   Dg Chest Port 1 View  Result Date: 10/31/2018 CLINICAL DATA:  Fall, hypertension EXAM: PORTABLE CHEST 1 VIEW COMPARISON:  09/16/2018 FINDINGS: Cardiomegaly. Lungs clear. No effusions. No overt edema. No acute bony abnormality. Scoliosis and degenerative changes in the thoracic spine. IMPRESSION: Cardiomegaly.  No active disease. Electronically Signed   By: Rolm Baptise M.D.   On: 10/31/2018 16:49   Dg Shoulder Right Portable  Result Date:  10/31/2018 CLINICAL DATA:  Fall, right shoulder pain EXAM: PORTABLE RIGHT SHOULDER COMPARISON:  None. FINDINGS: No fracture or dislocation is seen. Moderate to severe degenerative changes with chronic rotator cuff tear. Visualized soft tissues are within normal limits. Visualized right lung is clear. IMPRESSION: No fracture or dislocation is seen. Moderate to severe degenerative changes. Electronically Signed   By: Julian Hy M.D.   On: 10/31/2018 15:21   Dg C-arm 1-60 Min-no Report  Result Date: 11/09/2018 Fluoroscopy was utilized by the requesting physician.  No radiographic interpretation.   Dg Hip Unilat W Or Wo Pelvis 2-3 Views Right  Result Date: 10/31/2018 CLINICAL DATA:  Fall, right femur pain EXAM: DG HIP (WITH OR WITHOUT PELVIS) 2-3V RIGHT COMPARISON:  None. FINDINGS: No evidence of fracture dislocation of the right hip. Visualized bony pelvis appears intact. Bilateral hip joint spaces are symmetric. Distal femoral shaft fracture, described on dedicated femur radiographs. IMPRESSION: No evidence of fracture dislocation of the right hip. Distal femoral shaft fracture, described on dedicated femur radiographs. Electronically Signed   By: Julian Hy M.D.   On: 10/31/2018 15:24   Dg Femur, Min 2 Views Right  Result Date: 11/10/2018 CLINICAL DATA:  Intraoperative images.  Right femur fracture. EXAM: RIGHT FEMUR 2 VIEWS COMPARISON:  Preoperative femur radiographs earlier this day. FINDINGS: Three fluoroscopic spot views obtained in the operating room in frontal and lateral projections demonstrate cerclage wires fixating distal femur fracture. Fracture is in improved anatomic alignment. Right knee arthroplasty partially visualized. Total fluoroscopy time 9 seconds. Dose 0.70 mGy. IMPRESSION: Procedural fluoroscopy post cerclage wire fixation of distal femur fracture. Electronically Signed   By: Keith Rake M.D.   On: 11/10/2018 04:48   Dg Femur, Min 2 Views Right  Result  Date: 11/09/2018 CLINICAL DATA:  84 y/o F; right femur fracture with bleeding at the site of the fracture. EXAM: RIGHT FEMUR 2 VIEWS COMPARISON:  10/31/2018 femur radiographs FINDINGS: Oblique acute fracture of the lower femoral diaphysis. One shaft's width medial displacement of the proximal fracture component. Increased overriding and shortening to 10 cm. The tip of the fracture now extends to the skin surface and may be open. Knee arthroplasty and right hip joints are maintained. IMPRESSION: Acute oblique lower femoral diaphysis fracture with increased overriding and shortening of approximately 10 cm. The tip of the proximal fracture component now extends to the skin surface and may be open. Electronically Signed   By: Kristine Garbe M.D.   On: 11/09/2018 15:51   Dg Femur Min 2 Views Right  Result Date: 10/31/2018 CLINICAL DATA:  Fall, right  femur pain EXAM: RIGHT FEMUR 2 VIEWS COMPARISON:  None. FINDINGS: Displaced, angulated right mid/distal femoral shaft fracture. Apex dorsal angulation is suspected, although exact orientation is difficult due to difficulty with patient positioning. Right knee arthroplasty. IMPRESSION: Displaced, angulated right mid/distal femoral shaft fracture. Electronically Signed   By: Julian Hy M.D.   On: 10/31/2018 15:26   Dg Femur Port, Min 2 Views Right  Result Date: 10/31/2018 CLINICAL DATA:  Status post traction and knee immobilization for known right distal femoral fracture. Initial encounter. EXAM: RIGHT FEMUR PORTABLE 2 VIEW COMPARISON:  None. FINDINGS: Status post traction and immobilization of the right femur, there is resolution of previously noted angulation at the distal femoral fracture, though approximately 9 cm of shortening is noted, with medial and anterior displacement of the distal femur, and mild rotation. The patient's total knee arthroplasty is grossly unremarkable in appearance. No new fractures are seen. The right femoral head remains  seated at the acetabulum. IMPRESSION: Status post traction and immobilization of the right femur, there is resolution of previously noted angulation at the distal femoral fracture, though approximately 9 cm of shortening is noted, with medial and anterior displacement of the distal femur, and mild rotation. Electronically Signed   By: Garald Balding M.D.   On: 10/31/2018 22:04    DISCHARGE EXAMINATION: Vitals:   11/12/18 1347 11/12/18 2247 11/13/18 0505 11/13/18 0700  BP: (!) 161/53 (!) 134/55 (!) 136/56 (!) 152/68  Pulse: 66 88 68 81  Resp: 16 16 16 16   Temp: 97.7 F (36.5 C) 98.3 F (36.8 C) 97.9 F (36.6 C) (!) 97.5 F (36.4 C)  TempSrc: Oral Oral Oral Oral  SpO2: 98% 95% 93% 99%  Weight:      Height:       General appearance: alert, cooperative, appears stated age and no distress Resp: clear to auscultation bilaterally Cardio: regular rate and rhythm, S1, S2 normal, no murmur, click, rub or gallop GI: soft, non-tender; bowel sounds normal; no masses,  no organomegaly  DISPOSITION: SNF  Discharge Instructions    Call MD for:  difficulty breathing, headache or visual disturbances   Complete by:  As directed    Call MD for:  extreme fatigue   Complete by:  As directed    Call MD for:  persistant dizziness or light-headedness   Complete by:  As directed    Call MD for:  persistant nausea and vomiting   Complete by:  As directed    Call MD for:  severe uncontrolled pain   Complete by:  As directed    Call MD for:  temperature >100.4   Complete by:  As directed    Discharge instructions   Complete by:  As directed    Please review instructions on the discharge summary.  You were cared for by a hospitalist during your hospital stay. If you have any questions about your discharge medications or the care you received while you were in the hospital after you are discharged, you can call the unit and asked to speak with the hospitalist on call if the hospitalist that took care of  you is not available. Once you are discharged, your primary care physician will handle any further medical issues. Please note that NO REFILLS for any discharge medications will be authorized once you are discharged, as it is imperative that you return to your primary care physician (or establish a relationship with a primary care physician if you do not have one) for your aftercare needs  so that they can reassess your need for medications and monitor your lab values. If you do not have a primary care physician, you can call [phone] for a physician referral.   Increase activity slowly   Complete by:  As directed          Allergies as of 11/13/2018      Reactions   Lisinopril Swelling   Penicillins Rash   Has patient had a PCN reaction causing immediate rash, facial/tongue/throat swelling, SOB or lightheadedness with hypotension: Unknown Has patient had a PCN reaction causing severe rash involving mucus membranes or skin necrosis: Unknown Has patient had a PCN reaction that required hospitalization: Unknown Has patient had a PCN reaction occurring within the last 10 years: Unknown If all of the above answers are "NO", then may proceed with Cephalosporin use.      Medication List    STOP taking these medications   aspirin EC 81 MG tablet Replaced by:  aspirin 81 MG chewable tablet   cephALEXin 500 MG capsule Commonly known as:  KEFLEX     TAKE these medications   acetaminophen 500 MG tablet Commonly known as:  TYLENOL Take 500 mg by mouth every 8 (eight) hours as needed for mild pain.   aspirin 81 MG chewable tablet Chew 1 tablet (81 mg total) by mouth 2 (two) times daily. Take for 4 weeks, then resume regular dose. Replaces:  aspirin EC 81 MG tablet   cycloSPORINE 0.05 % ophthalmic emulsion Commonly known as:  RESTASIS Place 1 drop into both eyes 2 (two) times daily.   docusate sodium 100 MG capsule Commonly known as:  COLACE Take 1 capsule (100 mg total) by mouth 2 (two)  times daily.   donepezil 10 MG tablet Commonly known as:  ARICEPT Take 10 mg by mouth at bedtime.   ferrous sulfate 325 (65 FE) MG tablet Take 1 tablet (325 mg total) by mouth 3 (three) times daily after meals.   fosfomycin 3 g Pack Commonly known as:  MONUROL Take 3 g by mouth once for 1 dose. Once on 11/16/18 Start taking on:  11/16/2018   hydroxypropyl methylcellulose / hypromellose 2.5 % ophthalmic solution Commonly known as:  ISOPTO TEARS / GONIOVISC Place 1 drop into both eyes every 2 (two) hours as needed for dry eyes.   levothyroxine 88 MCG tablet Commonly known as:  SYNTHROID, LEVOTHROID Take 1 tablet (88 mcg total) by mouth daily before breakfast.   Metoprolol Tartrate 37.5 MG Tabs Take 37.5 mg by mouth 2 (two) times daily.   NUTRITIONAL SUPPLEMENT Liqd Take 1 Magic Cup by mouth at dinner   RESOURCE PO Take 90 mLs by mouth 3 (three) times daily.   ARGINAID EXTRA Liqd Take 1 Container by mouth daily.   oxyCODONE 5 MG immediate release tablet Commonly known as:  Oxy IR/ROXICODONE Take 0.5 tablets (2.5 mg total) by mouth every 8 (eight) hours as needed.   OXYGEN Inhale 2 L into the lungs continuous. To keep sat above 90%   polyethylene glycol powder powder Commonly known as:  GLYCOLAX/MIRALAX Take 17 g by mouth daily as needed for mild constipation.   PREVIDENT 5000 BOOSTER PLUS 1.1 % Pste Generic drug:  Sodium Fluoride Place 1 application onto teeth daily.   saccharomyces boulardii 250 MG capsule Commonly known as:  FLORASTOR Take 1 capsule (250 mg total) by mouth 2 (two) times daily for 5 days.   zinc oxide 20 % ointment Apply 1 application topically See admin instructions. Apply  Zinc Oxide to buttocks topically after every incontinent episode and as needed. May keep at bedside         Contact information for follow-up providers    Paralee Cancel, MD. Schedule an appointment as soon as possible for a visit in 2 weeks.   Specialty:  Orthopedic  Surgery Contact information: [address] [place] [address] [phone]            Contact information for after-discharge care    Destination    [facility] .   Service:  Skilled Nursing Contact information: [address] [place] [phone]                  TOTAL DISCHARGE TIME: 35 minutes  Bonnielee Haff  [facility] Pager [phone]  11/13/2018, 9:55 AM

## 2018-11-13 NOTE — Evaluation (Signed)
Occupational Therapy Evaluation Patient Details Name: Brittany [name] MRN: [id number] DOB: 1924-09-11 Today's Date: 11/13/2018    History of Present Illness  84 y.o. female from SNF, with medical history significant for vascular dementia, hyperlipidemia, hypertension, hypothyroidism, history of myelodysplastic syndrome and pancytopenia, admitted with recent Dx of R periprosthetic distal femur fx, s/p closed reduction and non-operative management. Found to have bleeding at fracture site with bone extending to skin. On November 09, 2018 underwent for ORIF of right distal femur fracture and I&D of right distal thigh wound.    Clinical Impression   Pt was admitted for the above. She has been followed by OT/PT prior to admission. She will benefit from continued OT services to work on Brittany [name] participation with adls and working toward toilet transfers while maintaining NWB on RLE    Follow Up Recommendations  SNF    Equipment Recommendations  None recommended by OT    Recommendations for Other Services       Precautions / Restrictions Precautions Precautions: Fall Required Braces or Orthoses: Knee Immobilizer - Right Knee Immobilizer - Right: On at all times Restrictions Weight Bearing Restrictions: Yes RLE Weight Bearing: Non weight bearing      Mobility Bed Mobility     Rolling: Total assist;+2 for physical assistance         General bed mobility comments: pt does initiate trying to reach for rail and turn 10%  Transfers                 General transfer comment: not tested    Balance                                           ADL either performed or assessed with clinical judgement   ADL Overall ADL's : Needs assistance/impaired Eating/Feeding: Maximal assistance;Bed level     Grooming Details (indicate cue type and reason): set up to wash face; mod/max A to comb hair bed level Upper Body Bathing: Maximal assistance;Bed level   Lower  Body Bathing: Total assistance;+2 for physical assistance;Bed level   Upper Body Dressing : Maximal assistance;Bed level   Lower Body Dressing: Total assistance;+2 for physical assistance;Bed level                 General ADL Comments: Pt was eating when I arrived. She has a poor appetite.  Daughter was assisting with feeding.  Pt did help hold milk carton.  Daughter feeding her to try to increase po intake.  Pt with limited shoulder AROM bil; does participate in adl activities     Vision         Perception     Praxis      Pertinent Vitals/Pain Pain Assessment: Faces Pain Location: R leg with rolling Pain Descriptors / Indicators: Grimacing Pain Intervention(s): Limited activity within patient's tolerance;Monitored during session;Premedicated before session;Repositioned     Hand Dominance     Extremity/Trunk Assessment Upper Extremity Assessment Upper Extremity Assessment: Generalized weakness;RUE deficits/detail;LUE deficits/detail RUE Deficits / Details: bil shoulder limitations:  AROM FF approximately 20 LUE Deficits / Details: shoulder AROM FF limited to approximately 50 with elevation           Communication Communication Communication: HOH   Cognition Arousal/Alertness: Awake/alert Behavior During Therapy: WFL for tasks assessed/performed Overall Cognitive Status: History of cognitive impairments - at baseline  General Comments: pt follows commands; very pleasant   General Comments  daughter provided recent history    Exercises     Shoulder Instructions      Home Living Family/patient expects to be discharged to:: Skilled nursing facility                                 Additional Comments: [facility]      Prior Functioning/Environment          Comments: [facility]:  has been working with OT/PT        OT Problem List: Decreased strength;Decreased range of  motion;Decreased activity tolerance;Impaired balance (sitting and/or standing);Pain      OT Treatment/Interventions: Self-care/ADL training;Therapeutic exercise;DME and/or AE instruction;Therapeutic activities;Patient/family education;Balance training    OT Goals(Current goals can be found in the care plan section) Acute Rehab OT Goals Patient Stated Goal: return to [facility] OT Goal Formulation: With family Time For Goal Achievement: 11/27/18 Potential to Achieve Goals: Fair ADL Goals Pt Will Perform Eating: with mod assist;sitting;bed level Pt Will Perform Grooming: with min assist;bed level;sitting(x 3 tasks) Pt Will Transfer to Toilet: with max assist;with +2 assist(lateral transfer to drop arm commode with NWB RLE) Additional ADL Goal #1: Pt will sit EOB x 5 minutes with min A for exercise/grooming activity  OT Frequency: Min 2X/week   Barriers to D/C:            Co-evaluation              AM-PAC OT "6 Clicks" Daily Activity     Outcome Measure Help from another person eating meals?: A Lot Help from another person taking care of personal grooming?: A Lot Help from another person toileting, which includes using toliet, bedpan, or urinal?: Total Help from another person bathing (including washing, rinsing, drying)?: Total Help from another person to put on and taking off regular upper body clothing?: A Lot Help from another person to put on and taking off regular lower body clothing?: Total 6 Click Score: 9   End of Session    Activity Tolerance: Patient tolerated treatment well Patient left: in bed;with call bell/phone within reach;with bed alarm set;with family/visitor present  OT Visit Diagnosis: Pain;Muscle weakness (generalized) (M62.81);History of falling (Z91.81) Pain - Right/Left: Right Pain - part of body: Hip                Time: 6811-5726 OT Time Calculation (min): 13 min Charges:  OT General Charges $OT Visit: 1 Visit OT Evaluation $OT Eval  Low Complexity: Brittany [name], OTR/L Acute Rehabilitation Services [phone] [facility] pager [phone] office 11/13/2018  Brittany [name] 11/13/2018, 1:10 PM

## 2018-11-13 NOTE — Progress Notes (Signed)
   Subjective: 4 Days Post-Op Procedure(s) (LRB): INTRAMEDULLARY (IM) RETROGRADE FEMORAL NAILING (Right) Patient reports pain as moderate.   Patient seen in rounds for Dr. Alvan Dame. Patient is well, and has had no acute complaints or problems other than pain in the right leg. Foley catheter in place. Denies chest pain or SOB.  Objective: Vital signs in last 24 hours: Temp:  [97.5 F (36.4 C)-98.3 F (36.8 C)] 97.5 F (36.4 C) (11/29 0700) Pulse Rate:  [56-88] 81 (11/29 0700) Resp:  [16] 16 (11/29 0700) BP: (134-161)/(53-68) 152/68 (11/29 0700) SpO2:  [93 %-99 %] 99 % (11/29 0700)  Intake/Output from previous day:  Intake/Output Summary (Last 24 hours) at 11/13/2018 0822 Last data filed at 11/13/2018 0700 Gross per 24 hour  Intake 856.24 ml  Output 1500 ml  Net -643.76 ml    Labs: Recent Labs    11/11/18 0503 11/12/18 0518 11/13/18 0514  HGB 7.5* 7.0* 9.9*   Recent Labs    11/12/18 0518 11/13/18 0514  WBC 8.7 9.6  RBC 2.54* 3.44*  HCT 23.5* 31.0*  PLT 304 337   Recent Labs    11/12/18 0518 11/13/18 0514  NA 140 139  K 4.3 4.2  CL 113* 107  CO2 21* 24  BUN 32* 24*  CREATININE 0.91 0.78  GLUCOSE 85 94  CALCIUM 8.7* 8.8*   Exam: General - Patient is Alert and Oriented Extremity - Neurologically intact Neurovascular intact Sensation intact distally Dorsiflexion/Plantar flexion intact Dressing/Incision - clean, dry, no drainage Motor Function - intact, moving foot and toes well on exam.   Past Medical History:  Diagnosis Date  . Blood transfusion 2006  . Colon polyps   . CVA (cerebral infarction) 2013  . Dementia ([place])   . Diverticulosis of colon   . DJD (degenerative joint disease)   . Hyperlipidemia   . Hypertension   . Hypothyroidism   . Melanoma ([place]) 03/20/15   removed with Moh's surgery  . Myelodysplasia 02/04/2012  . Normochromic normocytic anemia 02/04/2012  . Onychomycosis   . Osteoporosis   . Rectal prolapse   . Shoulder pain   .  Unstable gait     Assessment/Plan: 4 Days Post-Op Procedure(s) (LRB): INTRAMEDULLARY (IM) RETROGRADE FEMORAL NAILING (Right) Principal Problem:   Open right femoral fracture (HCC) Active Problems:   Hypothyroidism   Hyperlipidemia   Essential hypertension   Stroke (HCC)   Leukocytosis   Anemia  Estimated body mass index is 24.22 kg/m as calculated from the following:   Height as of this encounter: 4' 11.02" (1.499 m).   Weight as of this encounter: 54.4 kg. Up with therapy  DVT Prophylaxis - Lovenox Weight-bearing as tolerated  Dressing changed Disposition will be per medicine, awaiting OT/PT eval.  Theresa Duty, PA-C Orthopedic Surgery 11/13/2018, 8:22 AM

## 2018-11-13 NOTE — Care Management Important Message (Signed)
Important Message  Patient Details  Name: Brittany [name] MRN: [id number] Date of Birth: November 25, 1924   Medicare Important Message Given:  Yes    Kerin Salen 11/13/2018, 11:00 AMImportant Message  Patient Details  Name: Brittany [name] MRN: [id number] Date of Birth: 18-Dec-1923   Medicare Important Message Given:  Yes    Kerin Salen 11/13/2018, 11:00 AM

## 2018-11-13 NOTE — Progress Notes (Signed)
Patient son notified of her discharge back to [facility] arranged for transport. ETA: 1 hour Patient daughter and son in law at bedside.   Kathrin Greathouse, Marlinda Mike, MSW Clinical Social Worker  [phone] 11/13/2018  4:07 PM

## 2018-11-13 NOTE — Progress Notes (Signed)
[facility] has started [employer] authorization for SNF rehab If medically ready, patient can discharge to SNF once received.  Kathrin Greathouse, Marlinda Mike, MSW Clinical Social Worker  [phone] 11/13/2018  8:56 AM

## 2018-11-16 ENCOUNTER — Encounter: Payer: Self-pay | Admitting: Family Medicine

## 2018-11-16 ENCOUNTER — Non-Acute Institutional Stay (SKILLED_NURSING_FACILITY): Payer: Medicare Other | Admitting: Family Medicine

## 2018-11-16 DIAGNOSIS — S72401A Unspecified fracture of lower end of right femur, initial encounter for closed fracture: Secondary | ICD-10-CM | POA: Diagnosis not present

## 2018-11-16 DIAGNOSIS — D61818 Other pancytopenia: Secondary | ICD-10-CM

## 2018-11-16 DIAGNOSIS — F0150 Vascular dementia without behavioral disturbance: Secondary | ICD-10-CM

## 2018-11-16 DIAGNOSIS — I10 Essential (primary) hypertension: Secondary | ICD-10-CM

## 2018-11-16 NOTE — Progress Notes (Signed)
Provider:  Alain Honey, MD Location:  [facility] Room Number: 26 Place of Service:  SNF (31)  PCP: System, Pcp Not In Patient Care Team: System, Pcp Not In as PCP - General Milus Banister, MD as Consulting Physician (Gastroenterology) Heath Lark, MD as Consulting Physician (Hematology and Oncology) Mast, Man X, NP as Nurse Practitioner (Internal Medicine)  Extended Emergency Contact Information Primary Emergency Contact: [facility] Address: [place], [place] [address] [name] of [place] Phone: [phone] Relation: Son Secondary Emergency Contact: [name],[name] Address: [place] [address] [name] of [place] Phone: [phone] Relation: Daughter  Code Status: DNR Goals of Care: Advanced Directive information Advanced Directives 11/16/2018  Does Patient Have a Medical Advance Directive? Yes  Type of Paramedic of [place];Out of facility DNR (pink MOST or yellow form)  Does patient want to make changes to medical advance directive? No - Patient declined  Copy of [place] in Chart? Yes - validated most recent copy scanned in chart (See row information)  Would patient like information on creating a medical advance directive? -  Pre-existing out of facility DNR order (yellow form or pink MOST form) Yellow form placed in chart (order not valid for inpatient use)      Chief Complaint  Patient presents with  . New Admit To SNF    readmit to facility     HPI: Patient is a 84 y.o. female seen today for admission to [facility] skilled nursing facility.  This is her third admission last month.  Initially, she was admitted hospitalization for septicemia due to abscessed wound.  Second admission was prompted by a fall that resulted in femoral fracture.  Fracture was nonoperatively treated.  The femur was not operatively repaired  it but started bleeding and she was taken back to the hospital for operative procedure to stabilize fracture.  Other ongoing problems include dementia hypertension, hypothyroidism, anemia treated with erythropoietin  Past Medical History:  Diagnosis Date  . Blood transfusion 2006  . Colon polyps   . CVA (cerebral infarction) 2013  . Dementia ([place])   . Diverticulosis of colon   . DJD (degenerative joint disease)   . Hyperlipidemia   . Hypertension   . Hypothyroidism   . Melanoma ([place]) 03/20/15   removed with Moh's surgery  . Myelodysplasia 02/04/2012  . Normochromic normocytic anemia 02/04/2012  . Onychomycosis   . Osteoporosis   . Rectal prolapse   . Shoulder pain   . Unstable gait    Past Surgical History:  Procedure Laterality Date  . APPENDECTOMY    . CARPAL TUNNEL RELEASE Bilateral 2006  . FEMUR IM NAIL Right 11/09/2018   Procedure: INTRAMEDULLARY (IM) RETROGRADE FEMORAL NAILING;  Surgeon: Paralee Cancel, MD;  Location: [facility];  Service: Orthopedics;  Laterality: Right;  . FLEXIBLE SIGMOIDOSCOPY  07/09/2012   Procedure: FLEXIBLE SIGMOIDOSCOPY;  Surgeon: Inda Castle, MD;  Location: [facility];  Service: Endoscopy;  Laterality: N/A;  . INCISION AND DRAINAGE PERIRECTAL ABSCESS N/A 09/21/2018   Procedure: IRRIGATION AND DEBRIDEMENT PERIRECTAL ABSCESS;  Surgeon: Rolm Bookbinder, MD;  Location: [facility];  Service: General;  Laterality: N/A;  . MELANOMA EXCISION Right 2005   arm  . POLYPECTOMY  2004   Laparoscopic  . RECTOCELE REPAIR    . REPLACEMENT TOTAL KNEE  2005 & 2006  .  VAGINAL HYSTERECTOMY  1989    reports that she has never smoked. She has never used smokeless tobacco. She reports that she does not drink alcohol or use drugs. Social History   Socioeconomic History  . Marital status: Married    Spouse name: Not on file  . Number of children: Not on file  . Years of education: Not on file  . Highest education level: Not on file  Occupational History  .  Occupation: English as a second language teacher  Social Needs  . Financial resource strain: Not hard at all  . Food insecurity:    Worry: Never true    Inability: Never true  . Transportation needs:    Medical: No    Non-medical: No  Tobacco Use  . Smoking status: Never Smoker  . Smokeless tobacco: Never Used  Substance and Sexual Activity  . Alcohol use: No  . Drug use: No  . Sexual activity: Yes    Birth control/protection: Surgical    Comment: HYST  Lifestyle  . Physical activity:    Days per week: 0 days    Minutes per session: 0 min  . Stress: Not at all  Relationships  . Social connections:    Talks on phone: More than three times a week    Gets together: More than three times a week    Attends religious service: Never    Active member of club or organization: No    Attends meetings of clubs or organizations: Never    Relationship status: Married  . Intimate partner violence:    Fear of current or ex partner: No    Emotionally abused: No    Physically abused: No    Forced sexual activity: No  Other Topics Concern  . Not on file  Social History Narrative   Lives at [facility], moved to [place] 12/28/14   married   Never smoked   Alcohol none   Caffeine Coffee 1 cup   Exercise none    Functional Status Survey:    Family History  Problem Relation Age of Onset  . Prostate cancer Brother   . Cancer Brother        renal cell carcinoma  . Heart disease Brother   . Breast cancer Mother 40  . Cancer Father        unknown kind  . Arthritis Daughter     Health Maintenance  Topic Date Due  . OPHTHALMOLOGY EXAM  11/15/2014  . FOOT EXAM  11/30/2014  . URINE MICROALBUMIN  11/30/2014  . HEMOGLOBIN A1C  10/11/2015  . TETANUS/TDAP  11/10/2028  . INFLUENZA VACCINE  Completed  . DEXA SCAN  Completed  . PNA vac Low Risk Adult  Completed    Allergies  Allergen Reactions  . Lisinopril Swelling  . Penicillins Rash    Has patient had a PCN reaction causing immediate rash,  facial/tongue/throat swelling, SOB or lightheadedness with hypotension: Unknown Has patient had a PCN reaction causing severe rash involving mucus membranes or skin necrosis: Unknown Has patient had a PCN reaction that required hospitalization: Unknown Has patient had a PCN reaction occurring within the last 10 years: Unknown If all of the above answers are "NO", then may proceed with Cephalosporin use.    Outpatient Encounter Medications as of 11/16/2018  Medication Sig  . acetaminophen (TYLENOL) 500 MG tablet Take 500 mg by mouth every 8 (eight) hours as needed for mild pain.   Marland Kitchen aspirin (ASPIRIN CHILDRENS) 81 MG chewable tablet Chew 1 tablet (  81 mg total) by mouth 2 (two) times daily. Take for 4 weeks, then resume regular dose.  . cycloSPORINE (RESTASIS) 0.05 % ophthalmic emulsion Place 1 drop into both eyes 2 (two) times daily.    Marland Kitchen docusate sodium (COLACE) 100 MG capsule Take 1 capsule (100 mg total) by mouth 2 (two) times daily.  Marland Kitchen donepezil (ARICEPT) 10 MG tablet Take 10 mg by mouth at bedtime.  . ferrous sulfate 325 (65 FE) MG tablet Take 1 tablet (325 mg total) by mouth 3 (three) times daily after meals.  . hydroxypropyl methylcellulose / hypromellose (ISOPTO TEARS / GONIOVISC) 2.5 % ophthalmic solution Place 1 drop into both eyes every 2 (two) hours as needed for dry eyes.  Marland Kitchen levothyroxine (SYNTHROID, LEVOTHROID) 88 MCG tablet Take 1 tablet (88 mcg total) by mouth daily before breakfast.  . Metoprolol Tartrate 37.5 MG TABS Take 37.5 mg by mouth 2 (two) times daily.  Marland Kitchen NUTRITIONAL SUPPLEMENT LIQD Take 1 Magic Cup by mouth at dinner  . Nutritional Supplements (ARGINAID EXTRA) LIQD Take 1 Container by mouth daily.  . Nutritional Supplements (RESOURCE PO) Take 90 mLs by mouth 3 (three) times daily.  Marland Kitchen oxyCODONE (ROXICODONE) 5 MG immediate release tablet Take 0.5 tablets (2.5 mg total) by mouth every 8 (eight) hours as needed.  . OXYGEN Inhale 2 L into the lungs continuous. To keep sat  above 90%  . polyethylene glycol powder (GLYCOLAX/MIRALAX) powder Take 17 g by mouth daily as needed for mild constipation.   . saccharomyces boulardii (FLORASTOR) 250 MG capsule Take 1 capsule (250 mg total) by mouth 2 (two) times daily for 5 days.  Marland Kitchen zinc oxide 20 % ointment Apply 1 application topically See admin instructions. Apply Zinc Oxide to buttocks topically after every incontinent episode and as needed. May keep at bedside   . [DISCONTINUED] Sodium Fluoride (PREVIDENT 5000 BOOSTER PLUS) 1.1 % PSTE Place 1 application onto teeth daily.   . [DISCONTINUED] fosfomycin (MONUROL) 3 g PACK Take 3 g by mouth once for 1 dose. Once on 11/16/18   No facility-administered encounter medications on file as of 11/16/2018.     Review of Systems  Constitutional: Positive for activity change and unexpected weight change.  HENT: Positive for hearing loss.   Respiratory: Negative.   Cardiovascular: Negative.   Gastrointestinal: Negative.   Skin: Positive for wound.  Neurological: Positive for weakness.  Psychiatric/Behavioral: Positive for confusion.    There were no vitals filed for this visit. There is no height or weight on file to calculate BMI. Physical Exam  Constitutional: She appears well-developed.  HENT:  Head: Normocephalic.  Eyes: Pupils are equal, round, and reactive to light.  Neck: Normal range of motion.  Cardiovascular: Normal rate, regular rhythm and normal heart sounds.  Pulmonary/Chest: Effort normal and breath sounds normal.  Abdominal: Soft. Bowel sounds are normal.  Neurological:  Oriented to person  Skin: Skin is warm.  Perineal wound followed by Wound Care (healing)  Nursing note and vitals reviewed.   Labs reviewed: Basic Metabolic Panel: Recent Labs    09/24/18 0330 09/25/18 0539  11/11/18 0503 11/12/18 0518 11/13/18 0514  NA 142 141   < > 142 140 139  K 4.7 4.6   < > 5.1 4.3 4.2  CL 109 105   < > 111 113* 107  CO2 28 30   < > 21* 21* 24  GLUCOSE  101* 89   < > 115* 85 94  BUN 19 14   < >  42* 32* 24*  CREATININE 0.87 0.86   < > 1.05* 0.91 0.78  CALCIUM 8.7* 8.8*   < > 8.8* 8.7* 8.8*  MG 1.5* 1.7  --  1.7  --   --    < > = values in this interval not displayed.   Liver Function Tests: Recent Labs    09/17/18 0445 09/20/18 0706 09/21/18 0645  AST 43* 17 20  ALT 61* 31 25  ALKPHOS 97 72 64  BILITOT 1.0 1.1 0.6  PROT 5.2* 4.9* 5.2*  ALBUMIN 2.3* 2.2* 2.2*   No results for input(s): LIPASE, AMYLASE in the last 8760 hours. No results for input(s): AMMONIA in the last 8760 hours. CBC: Recent Labs    11/03/18 0500 11/04/18 0529  11/09/18 1424  11/11/18 0503 11/12/18 0518 11/13/18 0514  WBC 10.8* 9.8   < > 12.5*   < > 14.9* 8.7 9.6  NEUTROABS 7.4 6.3  --  8.5*  --   --   --   --   HGB 10.2* 10.8*   < > 10.3*   < > 7.5* 7.0* 9.9*  HCT 31.9* 33.0*   < > 34.1*   < > 24.8* 23.5* 31.0*  MCV 85.8 84.8  --  91.2   < > 90.5 92.5 90.1  PLT 297 309   < > 283   < > 280 304 337   < > = values in this interval not displayed.   Cardiac Enzymes: No results for input(s): CKTOTAL, CKMB, CKMBINDEX, TROPONINI in the last 8760 hours. BNP: Invalid input(s): POCBNP Lab Results  Component Value Date   HGBA1C 5.6 04/11/2015   Lab Results  Component Value Date   TSH 1.88 08/11/2018   Lab Results  Component Value Date   VITAMINB12 2,000 08/11/2018   Lab Results  Component Value Date   FOLATE >20.0 02/11/2007   Lab Results  Component Value Date   IRON 26 08/04/2018   TIBC 288 08/09/2014   FERRITIN 946 08/11/2018    Imaging and Procedures obtained prior to SNF admission: Dg Tibia/fibula Right  Result Date: 11/09/2018 CLINICAL DATA:  Distal femur fracture.  Pain. EXAM: RIGHT TIBIA AND FIBULA - 2 VIEW COMPARISON:  11/09/2018 femur radiographs. FINDINGS: Distal femoral spiral fracture again noted, better characterized on the femur radiographs. This appears moderately angulated and with considerable overlap. Total knee prosthesis  observed. No appreciable fracture of the tibia or fibula. IMPRESSION: 1. No tibial or fibular fracture is identified. 2. Notably displaced spiral fracture of the distal femur. Electronically Signed   By: Van Clines M.D.   On: 11/09/2018 18:10   Dg C-arm 1-60 Min-no Report  Result Date: 11/09/2018 Fluoroscopy was utilized by the requesting physician.  No radiographic interpretation.   Dg Femur, Min 2 Views Right  Result Date: 11/10/2018 CLINICAL DATA:  Intraoperative images.  Right femur fracture. EXAM: RIGHT FEMUR 2 VIEWS COMPARISON:  Preoperative femur radiographs earlier this day. FINDINGS: Three fluoroscopic spot views obtained in the operating room in frontal and lateral projections demonstrate cerclage wires fixating distal femur fracture. Fracture is in improved anatomic alignment. Right knee arthroplasty partially visualized. Total fluoroscopy time 9 seconds. Dose 0.70 mGy. IMPRESSION: Procedural fluoroscopy post cerclage wire fixation of distal femur fracture. Electronically Signed   By: Keith Rake M.D.   On: 11/10/2018 04:48   Dg Femur, Min 2 Views Right  Result Date: 11/09/2018 CLINICAL DATA:  84 y/o F; right femur fracture with bleeding at the site of the fracture. EXAM:  RIGHT FEMUR 2 VIEWS COMPARISON:  10/31/2018 femur radiographs FINDINGS: Oblique acute fracture of the lower femoral diaphysis. One shaft's width medial displacement of the proximal fracture component. Increased overriding and shortening to 10 cm. The tip of the fracture now extends to the skin surface and may be open. Knee arthroplasty and right hip joints are maintained. IMPRESSION: Acute oblique lower femoral diaphysis fracture with increased overriding and shortening of approximately 10 cm. The tip of the proximal fracture component now extends to the skin surface and may be open. Electronically Signed   By: Kristine Garbe M.D.   On: 11/09/2018 15:51    Assessment/Plan 1. Essential  hypertension BP's stable on metoprolol  2. Vascular dementia without behavioral disturbance ([place]) No behaviors but has very poor safety awareness.  3. Closed fracture of distal end of right femur, unspecified fracture morphology, initial encounter ([place]) Surgically repaired. Pt is NWB  4. Pancytopenia ([place]) Followed by hematology.  Will need to maintain optimal red cells for wound to heal   Family/ staff Communication: Findings communicated to son  Labs/tests ordered: none  Lillette Boxer. Sabra Heck, [place] [address] [place], [place] Office [phone]

## 2018-11-17 ENCOUNTER — Non-Acute Institutional Stay: Payer: Medicare Other | Admitting: Nurse Practitioner

## 2018-11-17 VITALS — Wt 120.0 lb

## 2018-11-17 DIAGNOSIS — Z515 Encounter for palliative care: Secondary | ICD-10-CM

## 2018-11-17 DIAGNOSIS — M810 Age-related osteoporosis without current pathological fracture: Secondary | ICD-10-CM

## 2018-11-17 DIAGNOSIS — S72401P Unspecified fracture of lower end of right femur, subsequent encounter for closed fracture with malunion: Secondary | ICD-10-CM

## 2018-11-17 DIAGNOSIS — R269 Unspecified abnormalities of gait and mobility: Secondary | ICD-10-CM

## 2018-11-17 DIAGNOSIS — F0150 Vascular dementia without behavioral disturbance: Secondary | ICD-10-CM

## 2018-11-17 DIAGNOSIS — F419 Anxiety disorder, unspecified: Secondary | ICD-10-CM

## 2018-11-18 ENCOUNTER — Encounter: Payer: Self-pay | Admitting: Nurse Practitioner

## 2018-11-18 NOTE — Progress Notes (Addendum)
PALLIATIVE CARE CONSULT VISIT   PATIENT NAME: Brittany [name] DOB: May 05, 1924 MRN: [id number]   [facility] Room 28  PRIMARY CARE PROVIDER:   Su Ley   Patient Care Team Oncology Dr. Dixie Dials (cardiology) [place]:   Porcha Deblanc [phone]   HISTORY OF PRESENT ILLNESS:  Brittany [name] is a 84 y.o. year old female with multiple medical problems including recent right femur IM nail for stabilization of Right distal periprosthetic femur fracture s/p traumatic fall;  sepsis 2/2 left gluteal cellulitis/abscess s/p I&D,  RLL PNA ; Diverticulitis ; atrial fibrillation   vascular dementia without behaviorial disturbance;  Palliative Care was asked to help with symptom management address goals of care.   Interim history: s/p IM nailing for stabilization of right distal femur fracture; transfusion per oncology for anemia  ASSESSMENT/RECOMMENDATIONS/PLAN  Pulmonary fibrosis oxygen dependent -continue same -continue same breathing at baseline  -s/p stabilization of right femur fracture with IMN on 11/09/18 -Right femur fracture s/p fall 11/16 -right temporal laceration s/p fall 11/10 -Pain due to fracture and CHI -continue same per Ortho recommendations -F/U with Orthopedic recommendations  -ASA for DVT ppx -pain controlled with acetaminophen  Vascular dementia without behavioral disturbance -continue same -oriented to self; staff note worsening of cognition since readmission -continue donepezil  HTN HLD Hypothyroidism Stage III CKD -continue same regimen per PCP -metoprolol, cardizem and losartan -synthroid 68mcgs   left gluteal ulcer 2/2 previous cellulitis/abscess s/p I&D -f/u with [facility] -Patient being treated and followed by [facility]  myelodysplastic syndrome Pancytopenia   -Per Oncology -Followed  by Oncology -continue ferrous sulfate   pAtrial fibrillaiton -per PCP and cardiology -followed by cardiology -continue metoprolol, and  cardizem  Protein-Calorie malnutrition   -seborrheic dermatitis of face/eyebrows and ear -Consider protein supplement (prostat BID) last albumin 2.2  -Consider antifungal cream or medicated shampoo -weight stable at 120 -continue supplements  ACP  - DNR/MOST-limited; give antibiotic's and IVF's no feeding tubes   I spent 45 minutes providing this consultation,  from 10:15 to 11:00. More than 50% of the time in this consultation was spent coordinating communication.   Daughter present for update; questions answered.  CODE STATUS: DNR/MOST  limited, give antibiotic's and IVF ;no feeding tube  PPS: 30% HOSPICE ELIGIBILITY/DIAGNOSIS: TBD  PAST MEDICAL HISTORY:  Past Medical History:  Diagnosis Date  . Blood transfusion 2006  . Colon polyps   . CVA (cerebral infarction) 2013  . Dementia ([place])   . Diverticulosis of colon   . DJD (degenerative joint disease)   . Hyperlipidemia   . Hypertension   . Hypothyroidism   . Melanoma ([place]) 03/20/15   removed with Moh's surgery  . Myelodysplasia 02/04/2012  . Normochromic normocytic anemia 02/04/2012  . Onychomycosis   . Osteoporosis   . Rectal prolapse   . Shoulder pain   . Unstable gait     SOCIAL HX:  Social History   Tobacco Use  . Smoking status: Never Smoker  . Smokeless tobacco: Never Used  Substance Use Topics  . Alcohol use: No    ALLERGIES:  Allergies  Allergen Reactions  . Lisinopril Swelling  . Penicillins Rash    Has patient had a PCN reaction causing immediate rash, facial/tongue/throat swelling, SOB or lightheadedness with hypotension: Unknown Has patient had a PCN reaction causing severe rash involving mucus membranes or skin necrosis: Unknown Has patient had a PCN reaction that required hospitalization: Unknown Has patient had a PCN reaction occurring within the  last 10 years: Unknown If all of the above answers are "NO", then may proceed with Cephalosporin use.     PERTINENT MEDICATIONS:  Outpatient  Encounter Medications as of 11/17/2018  Medication Sig  . acetaminophen (TYLENOL) 500 MG tablet Take 500 mg by mouth every 8 (eight) hours as needed for mild pain.   Marland Kitchen aspirin (ASPIRIN CHILDRENS) 81 MG chewable tablet Chew 1 tablet (81 mg total) by mouth 2 (two) times daily. Take for 4 weeks, then resume regular dose.  . cycloSPORINE (RESTASIS) 0.05 % ophthalmic emulsion Place 1 drop into both eyes 2 (two) times daily.    Marland Kitchen docusate sodium (COLACE) 100 MG capsule Take 1 capsule (100 mg total) by mouth 2 (two) times daily.  Marland Kitchen donepezil (ARICEPT) 10 MG tablet Take 10 mg by mouth at bedtime.  . ferrous sulfate 325 (65 FE) MG tablet Take 1 tablet (325 mg total) by mouth 3 (three) times daily after meals.  . hydroxypropyl methylcellulose / hypromellose (ISOPTO TEARS / GONIOVISC) 2.5 % ophthalmic solution Place 1 drop into both eyes every 2 (two) hours as needed for dry eyes.  Marland Kitchen levothyroxine (SYNTHROID, LEVOTHROID) 88 MCG tablet Take 1 tablet (88 mcg total) by mouth daily before breakfast.  . Metoprolol Tartrate 37.5 MG TABS Take 37.5 mg by mouth 2 (two) times daily.  Marland Kitchen NUTRITIONAL SUPPLEMENT LIQD Take 1 Magic Cup by mouth at dinner  . Nutritional Supplements (ARGINAID EXTRA) LIQD Take 1 Container by mouth daily.  . Nutritional Supplements (RESOURCE PO) Take 90 mLs by mouth 3 (three) times daily.  Marland Kitchen oxyCODONE (ROXICODONE) 5 MG immediate release tablet Take 0.5 tablets (2.5 mg total) by mouth every 8 (eight) hours as needed.  . OXYGEN Inhale 2 L into the lungs continuous. To keep sat above 90%  . polyethylene glycol powder (GLYCOLAX/MIRALAX) powder Take 17 g by mouth daily as needed for mild constipation.   . saccharomyces boulardii (FLORASTOR) 250 MG capsule Take 1 capsule (250 mg total) by mouth 2 (two) times daily for 5 days.  Marland Kitchen zinc oxide 20 % ointment Apply 1 application topically See admin instructions. Apply Zinc Oxide to buttocks topically after every incontinent episode and as needed. May keep at  bedside    No facility-administered encounter medications on file as of 11/17/2018.     PHYSICAL EXAM:   General: NAD, frail appearing, thin Cardiovascular: regular rate and rhythm Pulmonary: BBA CTA Abdomen: soft, nontender, + bowel sounds GU: no suprapubic tenderness Extremities: no edema,  Skin: no rashes Neurological: Weakness but otherwise nonfocal   G [place], NP

## 2018-11-18 NOTE — Progress Notes (Deleted)
This is a blank note please discard

## 2018-11-19 ENCOUNTER — Inpatient Hospital Stay: Payer: Medicare Other

## 2018-11-19 ENCOUNTER — Inpatient Hospital Stay: Payer: Medicare Other | Attending: Hematology and Oncology

## 2018-11-19 ENCOUNTER — Other Ambulatory Visit: Payer: Self-pay | Admitting: Hematology and Oncology

## 2018-11-19 VITALS — BP 122/46 | HR 56

## 2018-11-19 DIAGNOSIS — D469 Myelodysplastic syndrome, unspecified: Secondary | ICD-10-CM | POA: Diagnosis not present

## 2018-11-19 LAB — CBC WITH DIFFERENTIAL/PLATELET
ABS IMMATURE GRANULOCYTES: 0.11 10*3/uL — AB (ref 0.00–0.07)
Basophils Absolute: 0 10*3/uL (ref 0.0–0.1)
Basophils Relative: 0 %
Eosinophils Absolute: 0.1 10*3/uL (ref 0.0–0.5)
Eosinophils Relative: 2 %
HCT: 30.9 % — ABNORMAL LOW (ref 36.0–46.0)
HEMOGLOBIN: 9.7 g/dL — AB (ref 12.0–15.0)
Immature Granulocytes: 2 %
Lymphocytes Relative: 15 %
Lymphs Abs: 1.1 10*3/uL (ref 0.7–4.0)
MCH: 29.1 pg (ref 26.0–34.0)
MCHC: 31.4 g/dL (ref 30.0–36.0)
MCV: 92.8 fL (ref 80.0–100.0)
Monocytes Absolute: 1.9 10*3/uL — ABNORMAL HIGH (ref 0.1–1.0)
Monocytes Relative: 26 %
NEUTROS ABS: 4.1 10*3/uL (ref 1.7–7.7)
Neutrophils Relative %: 55 %
Platelets: 290 10*3/uL (ref 150–400)
RBC: 3.33 MIL/uL — ABNORMAL LOW (ref 3.87–5.11)
RDW: 18.6 % — ABNORMAL HIGH (ref 11.5–15.5)
WBC: 7.3 10*3/uL (ref 4.0–10.5)
nRBC: 0 % (ref 0.0–0.2)

## 2018-11-19 MED ORDER — DARBEPOETIN ALFA 300 MCG/0.6ML IJ SOSY
300.0000 ug | PREFILLED_SYRINGE | Freq: Once | INTRAMUSCULAR | Status: AC
  Administered 2018-11-19: 300 ug via SUBCUTANEOUS

## 2018-11-19 MED ORDER — DARBEPOETIN ALFA 300 MCG/0.6ML IJ SOSY
PREFILLED_SYRINGE | INTRAMUSCULAR | Status: AC
  Filled 2018-11-19: qty 0.6

## 2018-11-19 NOTE — Patient Instructions (Signed)

## 2018-11-26 ENCOUNTER — Encounter (HOSPITAL_BASED_OUTPATIENT_CLINIC_OR_DEPARTMENT_OTHER): Payer: Medicare Other | Attending: Internal Medicine

## 2018-11-26 DIAGNOSIS — N186 End stage renal disease: Secondary | ICD-10-CM | POA: Diagnosis not present

## 2018-11-26 DIAGNOSIS — Y838 Other surgical procedures as the cause of abnormal reaction of the patient, or of later complication, without mention of misadventure at the time of the procedure: Secondary | ICD-10-CM | POA: Diagnosis not present

## 2018-11-26 DIAGNOSIS — I120 Hypertensive chronic kidney disease with stage 5 chronic kidney disease or end stage renal disease: Secondary | ICD-10-CM | POA: Insufficient documentation

## 2018-11-26 DIAGNOSIS — F0390 Unspecified dementia without behavioral disturbance: Secondary | ICD-10-CM | POA: Insufficient documentation

## 2018-11-26 DIAGNOSIS — T8189XA Other complications of procedures, not elsewhere classified, initial encounter: Secondary | ICD-10-CM | POA: Diagnosis present

## 2018-11-30 ENCOUNTER — Non-Acute Institutional Stay (SKILLED_NURSING_FACILITY): Payer: Medicare Other | Admitting: Family

## 2018-11-30 ENCOUNTER — Encounter: Payer: Self-pay | Admitting: Family

## 2018-11-30 DIAGNOSIS — L570 Actinic keratosis: Secondary | ICD-10-CM | POA: Diagnosis not present

## 2018-11-30 DIAGNOSIS — L03211 Cellulitis of face: Secondary | ICD-10-CM | POA: Diagnosis not present

## 2018-11-30 NOTE — Progress Notes (Signed)
Location:  [facility] Room Number: N26A Place of Service:  SNF (31) Provider: Arthur Aydelotte FNP-C  System, Pcp Not In  Patient Care Team: System, Pcp Not In as PCP - General Milus Banister, MD as Consulting Physician (Gastroenterology) Heath Lark, MD as Consulting Physician (Hematology and Oncology) Mast, Man X, NP as Nurse Practitioner (Internal Medicine)  Extended Emergency Contact Information Primary Emergency Contact: [facility] Address: [place],  [address] [name] of [place] Phone: [phone] Relation: Son Secondary Emergency Contact: [name],[name] Address: [place] [address] [name] of [place] Phone: [phone] Relation: Daughter  Code Status:  DNR Goals of care: Advanced Directive information Advanced Directives 11/30/2018  Does Patient Have a Medical Advance Directive? Yes  Type of Paramedic of [place];Out of facility DNR (pink MOST or yellow form)  Does patient want to make changes to medical advance directive? No - Patient declined  Copy of [place] in Chart? Yes - validated most recent copy scanned in chart (See row information)  Would patient like information on creating a medical advance directive? -  Pre-existing out of facility DNR order (yellow form or pink MOST form) Yellow form placed in chart (order not valid for inpatient use)     Chief Complaint  Patient presents with  . Acute Visit    Wound Drainage to right temple    HPI:  Pt is a 84 y.o. female seen today at [facility] for an acute visit for evaluation of drainage from right temple wound.she denies any fever or chills.Nurse reports yellow greenish thick pus draining from mole site.Site tender to touch.No injury to site reported.    Past Medical History:  Diagnosis Date  . Blood transfusion 2006  . Colon polyps   . CVA  (cerebral infarction) 2013  . Dementia ([place])   . Diverticulosis of colon   . DJD (degenerative joint disease)   . Hyperlipidemia   . Hypertension   . Hypothyroidism   . Melanoma ([place]) 03/20/15   removed with Moh's surgery  . Myelodysplasia 02/04/2012  . Normochromic normocytic anemia 02/04/2012  . Onychomycosis   . Osteoporosis   . Rectal prolapse   . Shoulder pain   . Unstable gait    Past Surgical History:  Procedure Laterality Date  . APPENDECTOMY    . CARPAL TUNNEL RELEASE Bilateral 2006  . FEMUR IM NAIL Right 11/09/2018   Procedure: INTRAMEDULLARY (IM) RETROGRADE FEMORAL NAILING;  Surgeon: Paralee Cancel, MD;  Location: [facility] ORS;  Service: Orthopedics;  Laterality: Right;  . FLEXIBLE SIGMOIDOSCOPY  07/09/2012   Procedure: FLEXIBLE SIGMOIDOSCOPY;  Surgeon: Inda Castle, MD;  Location: [facility];  Service: Endoscopy;  Laterality: N/A;  . INCISION AND DRAINAGE PERIRECTAL ABSCESS N/A 09/21/2018   Procedure: IRRIGATION AND DEBRIDEMENT PERIRECTAL ABSCESS;  Surgeon: Rolm Bookbinder, MD;  Location: [place] [facility];  Service: General;  Laterality: N/A;  . MELANOMA EXCISION Right 2005   arm  . POLYPECTOMY  2004   Laparoscopic  . RECTOCELE REPAIR    . REPLACEMENT TOTAL KNEE  2005 & 2006  . VAGINAL HYSTERECTOMY  1989    Allergies  Allergen Reactions  . Lisinopril Swelling  . Penicillins Rash    Has patient had a PCN reaction causing immediate rash, facial/tongue/throat swelling, SOB or lightheadedness with hypotension: Unknown Has patient had a PCN  reaction causing severe rash involving mucus membranes or skin necrosis: Unknown Has patient had a PCN reaction that required hospitalization: Unknown Has patient had a PCN reaction occurring within the last 10 years: Unknown If all of the above answers are "NO", then may proceed with Cephalosporin use.    Outpatient Encounter Medications as of 11/30/2018  Medication Sig  . acetaminophen (TYLENOL) 500 MG tablet Take 500 mg by mouth every 8  (eight) hours as needed for mild pain.   Marland Kitchen aspirin (ASPIRIN CHILDRENS) 81 MG chewable tablet Chew 1 tablet (81 mg total) by mouth 2 (two) times daily. Take for 4 weeks, then resume regular dose.  . cycloSPORINE (RESTASIS) 0.05 % ophthalmic emulsion Place 1 drop into both eyes 2 (two) times daily.    Marland Kitchen docusate sodium (COLACE) 100 MG capsule Take 1 capsule (100 mg total) by mouth 2 (two) times daily.  Marland Kitchen donepezil (ARICEPT) 10 MG tablet Take 10 mg by mouth at bedtime.  . ferrous sulfate 325 (65 FE) MG tablet Take 1 tablet (325 mg total) by mouth 3 (three) times daily after meals.  . hydroxypropyl methylcellulose / hypromellose (ISOPTO TEARS / GONIOVISC) 2.5 % ophthalmic solution Place 1 drop into both eyes every 2 (two) hours as needed for dry eyes.  Marland Kitchen levothyroxine (SYNTHROID, LEVOTHROID) 88 MCG tablet Take 1 tablet (88 mcg total) by mouth daily before breakfast.  . Metoprolol Tartrate 37.5 MG TABS Take 37.5 mg by mouth 2 (two) times daily.  Marland Kitchen NUTRITIONAL SUPPLEMENT LIQD Take 1 Magic Cup by mouth at dinner  . Nutritional Supplements (ARGINAID EXTRA) LIQD Take 1 Container by mouth daily.  . Nutritional Supplements (RESOURCE PO) Take 90 mLs by mouth 3 (three) times daily.  Marland Kitchen oxyCODONE (ROXICODONE) 5 MG immediate release tablet Take 0.5 tablets (2.5 mg total) by mouth every 8 (eight) hours as needed.  . OXYGEN Inhale 2 L into the lungs continuous. To keep sat above 90%  . polyethylene glycol powder (GLYCOLAX/MIRALAX) powder Take 17 g by mouth daily as needed for mild constipation.   . Sodium Fluoride (PREVIDENT 5000 BOOSTER PLUS) 1.1 % PSTE Place 1 application onto teeth every morning. Brush on to teeth as directed  . zinc oxide 20 % ointment Apply 1 application topically See admin instructions. Apply Zinc Oxide to buttocks topically after every incontinent episode and as needed. May keep at bedside    No facility-administered encounter medications on file as of 11/30/2018.     Review of Systems    Constitutional: Negative for appetite change, chills, fatigue and fever.  HENT: Positive for hearing loss. Negative for congestion, rhinorrhea, sinus pressure, sinus pain, sneezing and sore throat.   Eyes: Positive for visual disturbance. Negative for discharge, redness and itching.  Respiratory: Negative for cough, chest tightness, shortness of breath and wheezing.   Cardiovascular: Negative for chest pain, palpitations and leg swelling.  Skin: Negative for color change, pallor and rash.       1. Right temple mole site draining yellow greenish thick pus.  2. Right gluteal wound dressing intact     Neurological: Negative for dizziness, light-headedness and headaches.    Immunization History  Administered Date(s) Administered  . Influenza Split 10/17/2011, 09/14/2012  . Influenza Whole 10/17/2007  . Influenza, High Dose Seasonal PF 10/11/2013, 10/05/2014  . Influenza,inj,Quad PF,6+ Mos 10/05/2018  . Influenza-Unspecified 09/05/2015, 10/02/2016, 10/06/2017  . PPD Test 01/12/2015  . Pneumococcal Conjugate-13 09/29/2014  . Pneumococcal Polysaccharide-23 12/16/1998  . Td 11/10/2018  . Tdap 04/23/2011  .  Zoster 07/04/2006   Pertinent  Health Maintenance Due  Topic Date Due  . OPHTHALMOLOGY EXAM  11/15/2014  . FOOT EXAM  11/30/2014  . URINE MICROALBUMIN  11/30/2014  . HEMOGLOBIN A1C  10/11/2015  . INFLUENZA VACCINE  Completed  . DEXA SCAN  Completed  . PNA vac Low Risk Adult  Completed   Fall Risk  10/06/2018 10/05/2018 09/02/2017 07/04/2016 01/04/2016  Falls in the past year? No No No No No   Functional Status Survey:    Vitals:   11/30/18 1030  BP: 119/64  Pulse: 77  Resp: 18  Temp: 97.8 F (36.6 C)  TempSrc: Oral  SpO2: 97%  Weight: 119 lb (54 kg)  Height: 4\' 11"  (1.499 m)   Body mass index is 24.04 kg/m. Physical Exam Vitals signs and nursing note reviewed.  Constitutional:      General: She is not in acute distress.    Appearance: Normal appearance.  HENT:      Head: Normocephalic.     Nose: Nose normal. No congestion or rhinorrhea.     Mouth/Throat:     Mouth: Mucous membranes are moist.     Pharynx: Oropharynx is clear. No oropharyngeal exudate or posterior oropharyngeal erythema.  Eyes:     General: No scleral icterus.       Right eye: No discharge.        Left eye: No discharge.     Extraocular Movements: Extraocular movements intact.     Conjunctiva/sclera: Conjunctivae normal.     Pupils: Pupils are equal, round, and reactive to light.  Neck:     Musculoskeletal: Normal range of motion. No muscular tenderness.  Cardiovascular:     Rate and Rhythm: Normal rate and regular rhythm.     Pulses: Normal pulses.     Heart sounds: Murmur present. No friction rub. No gallop.   Pulmonary:     Effort: Pulmonary effort is normal. No respiratory distress.     Breath sounds: Normal breath sounds. No wheezing, rhonchi or rales.     Comments: Oxygen via nasal cannula  Chest:     Chest wall: No tenderness.  Lymphadenopathy:     Cervical: No cervical adenopathy.  Skin:    General: Skin is warm and dry.     Coloration: Skin is not pale.     Findings: No rash.     Comments: Right temple skin lesion oozing yellow thick pus.Tender to touch.   Neurological:     Mental Status: She is alert. Mental status is at baseline.  Psychiatric:        Mood and Affect: Mood normal.        Behavior: Behavior normal.        Thought Content: Thought content normal.        Judgment: Judgment normal.     Labs reviewed: Recent Labs    09/24/18 0330 09/25/18 0539  11/11/18 0503 11/12/18 0518 11/13/18 0514  NA 142 141   < > 142 140 139  K 4.7 4.6   < > 5.1 4.3 4.2  CL 109 105   < > 111 113* 107  CO2 28 30   < > 21* 21* 24  GLUCOSE 101* 89   < > 115* 85 94  BUN 19 14   < > 42* 32* 24*  CREATININE 0.87 0.86   < > 1.05* 0.91 0.78  CALCIUM 8.7* 8.8*   < > 8.8* 8.7* 8.8*  MG 1.5* 1.7  --  1.7  --   --    < > =  values in this interval not displayed.    Recent Labs    09/17/18 0445 09/20/18 0706 09/21/18 0645  AST 43* 17 20  ALT 61* 31 25  ALKPHOS 97 72 64  BILITOT 1.0 1.1 0.6  PROT 5.2* 4.9* 5.2*  ALBUMIN 2.3* 2.2* 2.2*   Recent Labs    11/04/18 0529  11/09/18 1424  11/12/18 0518 11/13/18 0514 11/19/18 1006  WBC 9.8   < > 12.5*   < > 8.7 9.6 7.3  NEUTROABS 6.3  --  8.5*  --   --   --  4.1  HGB 10.8*   < > 10.3*   < > 7.0* 9.9* 9.7*  HCT 33.0*   < > 34.1*   < > 23.5* 31.0* 30.9*  MCV 84.8  --  91.2   < > 92.5 90.1 92.8  PLT 309   < > 283   < > 304 337 290   < > = values in this interval not displayed.   Lab Results  Component Value Date   TSH 1.88 08/11/2018   Lab Results  Component Value Date   HGBA1C 5.6 04/11/2015   Lab Results  Component Value Date   CHOL 147 04/22/2017   HDL 47 04/22/2017   LDLCALC 77 04/22/2017   LDLDIRECT 131.2 11/30/2013   TRIG 125 04/22/2017   CHOLHDL 4 11/30/2013    Significant Diagnostic Results in last 30 days:  Dg Tibia/fibula Right  Result Date: 11/09/2018 CLINICAL DATA:  Distal femur fracture.  Pain. EXAM: RIGHT TIBIA AND FIBULA - 2 VIEW COMPARISON:  11/09/2018 femur radiographs. FINDINGS: Distal femoral spiral fracture again noted, better characterized on the femur radiographs. This appears moderately angulated and with considerable overlap. Total knee prosthesis observed. No appreciable fracture of the tibia or fibula. IMPRESSION: 1. No tibial or fibular fracture is identified. 2. Notably displaced spiral fracture of the distal femur. Electronically Signed   By: Van Clines M.D.   On: 11/09/2018 18:10   Dg C-arm 1-60 Min-no Report  Result Date: 11/09/2018 Fluoroscopy was utilized by the requesting physician.  No radiographic interpretation.   Dg Femur, Min 2 Views Right  Result Date: 11/10/2018 CLINICAL DATA:  Intraoperative images.  Right femur fracture. EXAM: RIGHT FEMUR 2 VIEWS COMPARISON:  Preoperative femur radiographs earlier this day. FINDINGS: Three  fluoroscopic spot views obtained in the operating room in frontal and lateral projections demonstrate cerclage wires fixating distal femur fracture. Fracture is in improved anatomic alignment. Right knee arthroplasty partially visualized. Total fluoroscopy time 9 seconds. Dose 0.70 mGy. IMPRESSION: Procedural fluoroscopy post cerclage wire fixation of distal femur fracture. Electronically Signed   By: Keith Rake M.D.   On: 11/10/2018 04:48   Dg Femur, Min 2 Views Right  Result Date: 11/09/2018 CLINICAL DATA:  84 y/o F; right femur fracture with bleeding at the site of the fracture. EXAM: RIGHT FEMUR 2 VIEWS COMPARISON:  10/31/2018 femur radiographs FINDINGS: Oblique acute fracture of the lower femoral diaphysis. One shaft's width medial displacement of the proximal fracture component. Increased overriding and shortening to 10 cm. The tip of the fracture now extends to the skin surface and may be open. Knee arthroplasty and right hip joints are maintained. IMPRESSION: Acute oblique lower femoral diaphysis fracture with increased overriding and shortening of approximately 10 cm. The tip of the proximal fracture component now extends to the skin surface and may be open. Electronically Signed   By: Kristine Garbe M.D.   On: 11/09/2018 15:51   Dg  Femur [place], [place] 2 Views Right  Result Date: 10/31/2018 CLINICAL DATA:  Status post traction and knee immobilization for known right distal femoral fracture. Initial encounter. EXAM: RIGHT FEMUR PORTABLE 2 VIEW COMPARISON:  None. FINDINGS: Status post traction and immobilization of the right femur, there is resolution of previously noted angulation at the distal femoral fracture, though approximately 9 cm of shortening is noted, with medial and anterior displacement of the distal femur, and mild rotation. The patient's total knee arthroplasty is grossly unremarkable in appearance. No new fractures are seen. The right femoral head remains seated at the  acetabulum. IMPRESSION: Status post traction and immobilization of the right femur, there is resolution of previously noted angulation at the distal femoral fracture, though approximately 9 cm of shortening is noted, with medial and anterior displacement of the distal femur, and mild rotation. Electronically Signed   By: Garald Balding M.D.   On: 10/31/2018 22:04    Assessment/Plan 1. Cellulitis of temple Afebrile.Right temple skin lesion site tender to touch with  Moderate amounts of thick yellow.start doxycycline 100 mg tablet one by mouth twice daily x 10 days along with Florastor 250 mg capsule twice daily x 10 days for antibiotic associated diarrhea prevention.continue to monitor.   2. AK (actinic keratosis) Large irregular boarder with different shade of gray color and rough texture.Refer to dermatology ASAP for evaluation. Antibiotics as above.  Family/ staff Communication: Reviewed plan of care with patient and facility Nurse.   Labs/tests ordered: None   Kylo Gavin C Nahome Bublitz, NP

## 2018-12-03 LAB — BASIC METABOLIC PANEL
BUN: 25 — AB (ref 4–21)
Creatinine: 0.9 (ref 0.5–1.1)
Glucose: 83
POTASSIUM: 4.5 (ref 3.4–5.3)
Sodium: 143 (ref 137–147)

## 2018-12-03 IMAGING — CR DG SHOULDER 2+V PORT*R*
2 series · 2 of 2 positions shown · non-contrast
Comparison: None.

CLINICAL DATA: Fall, right shoulder pain

EXAM:
PORTABLE RIGHT SHOULDER

[x shoulder ap right (1 of 2)]
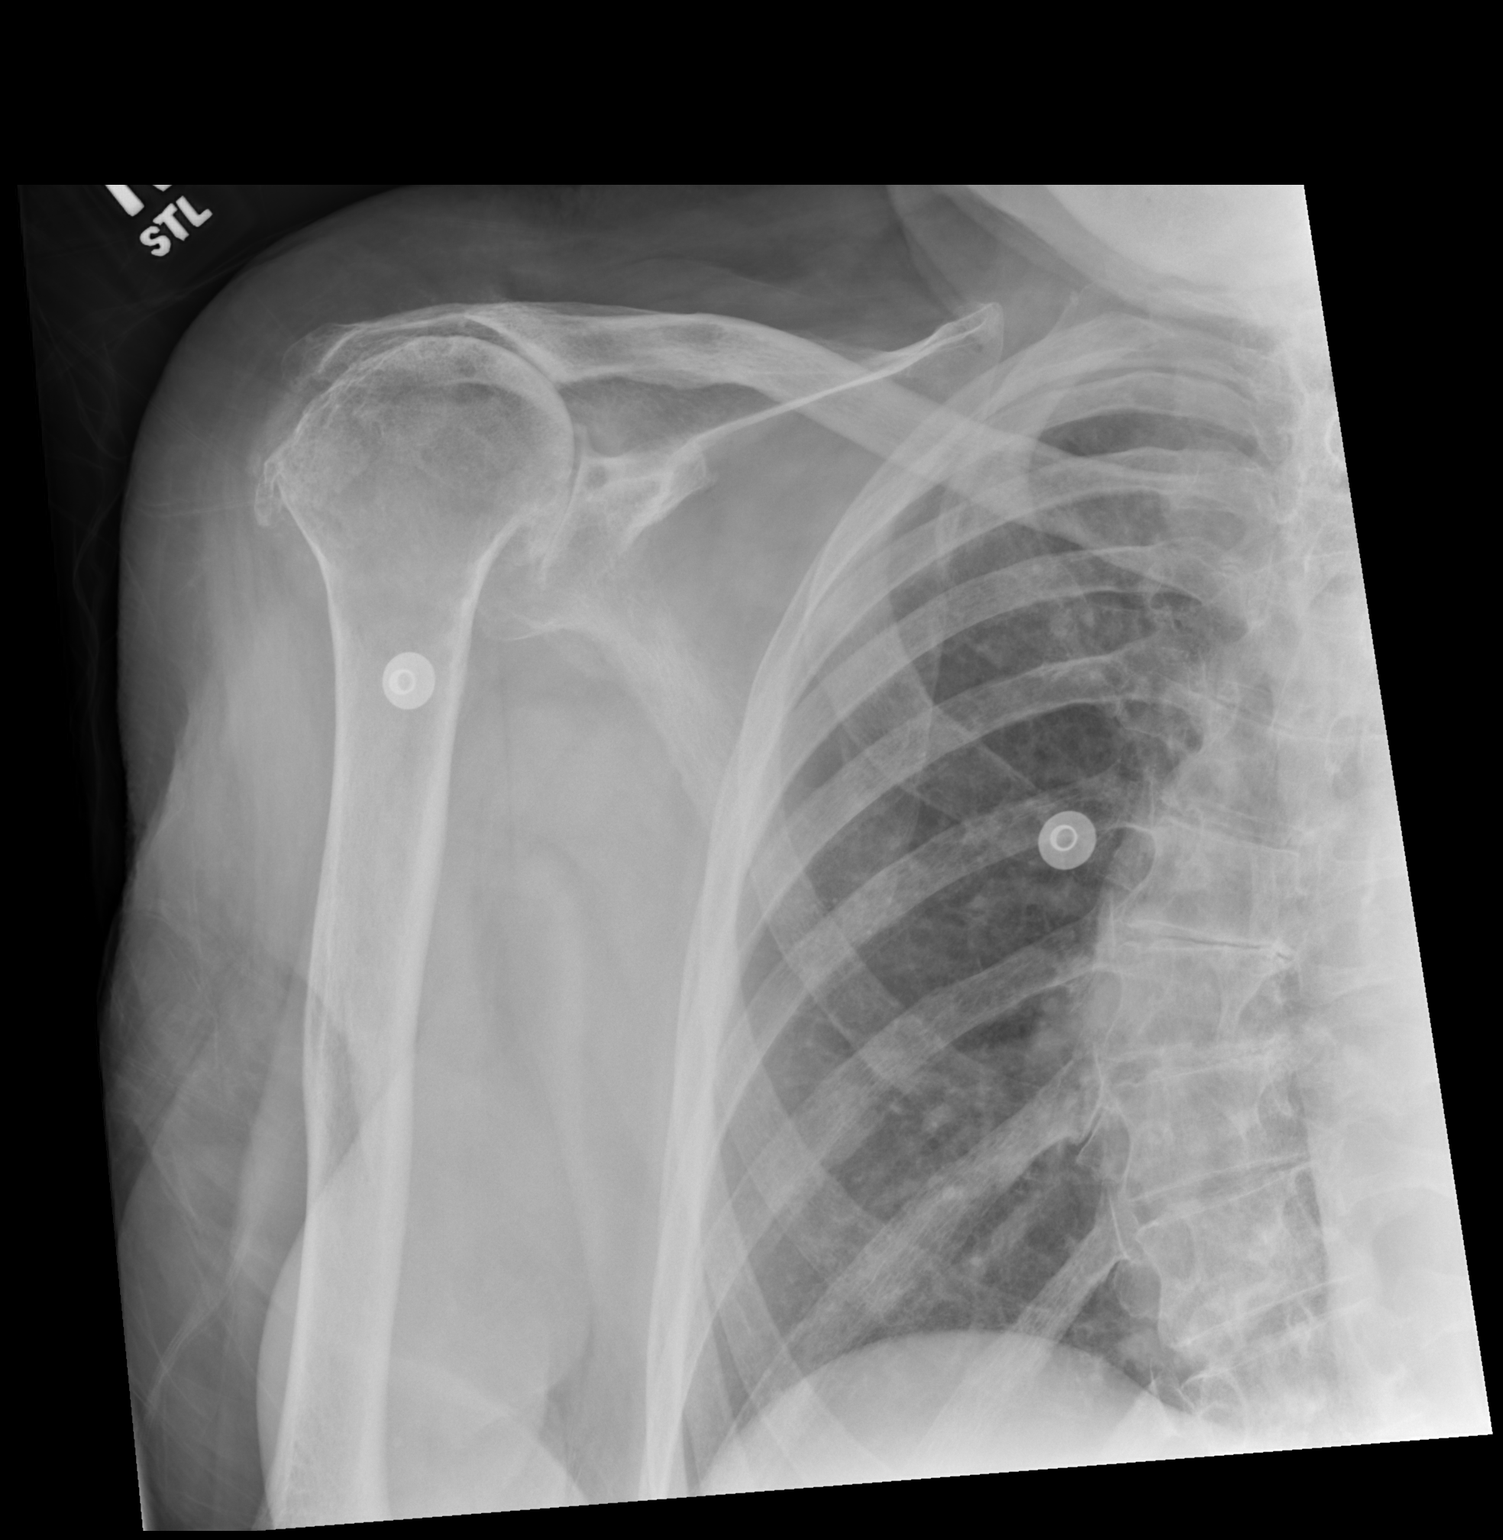

[x shoulder ap right (2 of 2)]
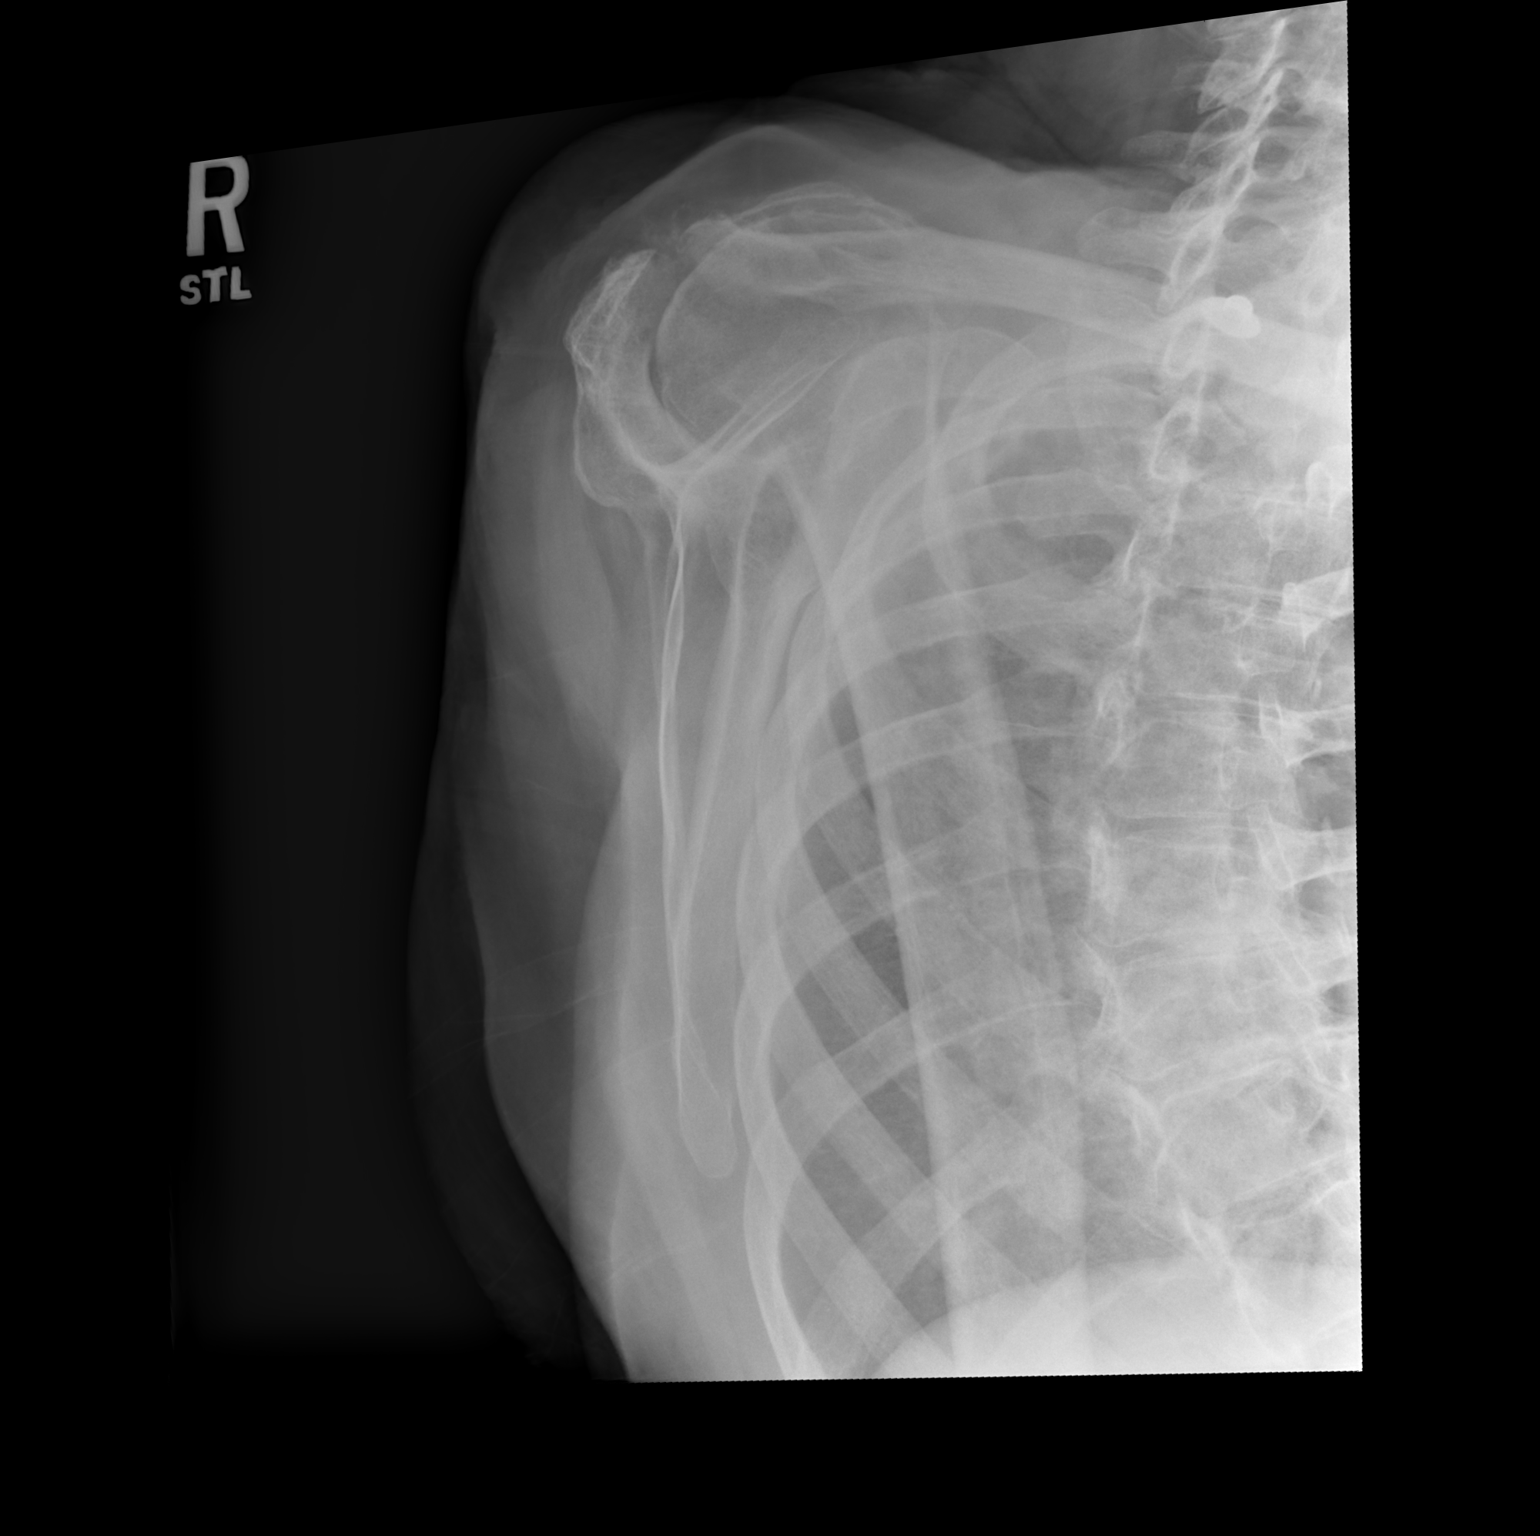

[2 of 2 positions shown; findings below may reference images not displayed]

FINDINGS: No fracture or dislocation is seen.

Moderate to severe degenerative changes with chronic rotator cuff
tear.

Visualized soft tissues are within normal limits.

Visualized right lung is clear.
IMPRESSION: No fracture or dislocation is seen.

Moderate to severe degenerative changes.

## 2018-12-03 IMAGING — CR DG FEMUR 2+V*R*
1 series · 1 of 1 positions shown · non-contrast
Comparison: None.

CLINICAL DATA: Fall, right femur pain

EXAM:
RIGHT FEMUR 2 VIEWS

[x pelvis]
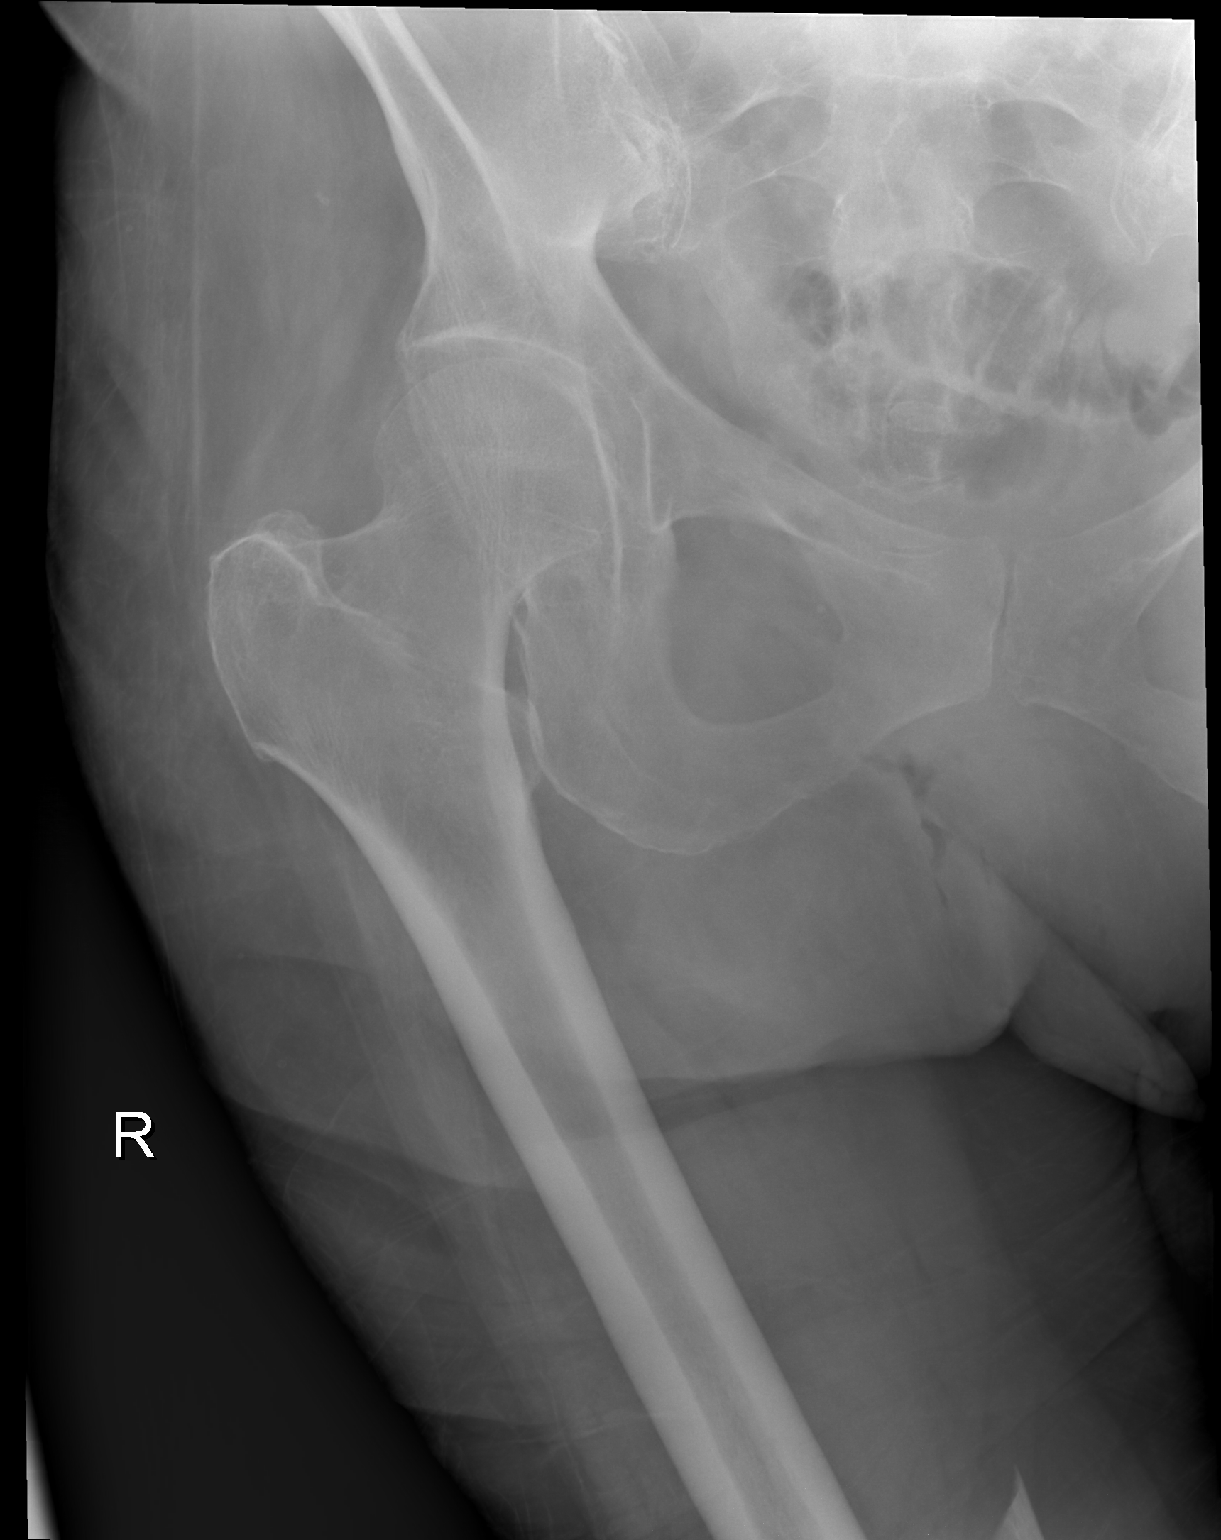

[1 of 1 positions shown; findings below may reference images not displayed]

FINDINGS: Displaced, angulated right mid/distal femoral shaft fracture. Apex
dorsal angulation is suspected, although exact orientation is
difficult due to difficulty with patient positioning.

Right knee arthroplasty.
IMPRESSION: Displaced, angulated right mid/distal femoral shaft fracture.

## 2018-12-08 ENCOUNTER — Encounter: Payer: Self-pay | Admitting: Family Medicine

## 2018-12-12 IMAGING — RF DG FEMUR 2+V*R*
1 series · 3 of 3 positions shown · non-contrast
Comparison: Preoperative femur radiographs earlier this day.

CLINICAL DATA: Intraoperative images.  Right femur fracture.

EXAM:
RIGHT FEMUR 2 VIEWS

[Series 1: run · 3 of 3 slices shown]
[im 1/3]
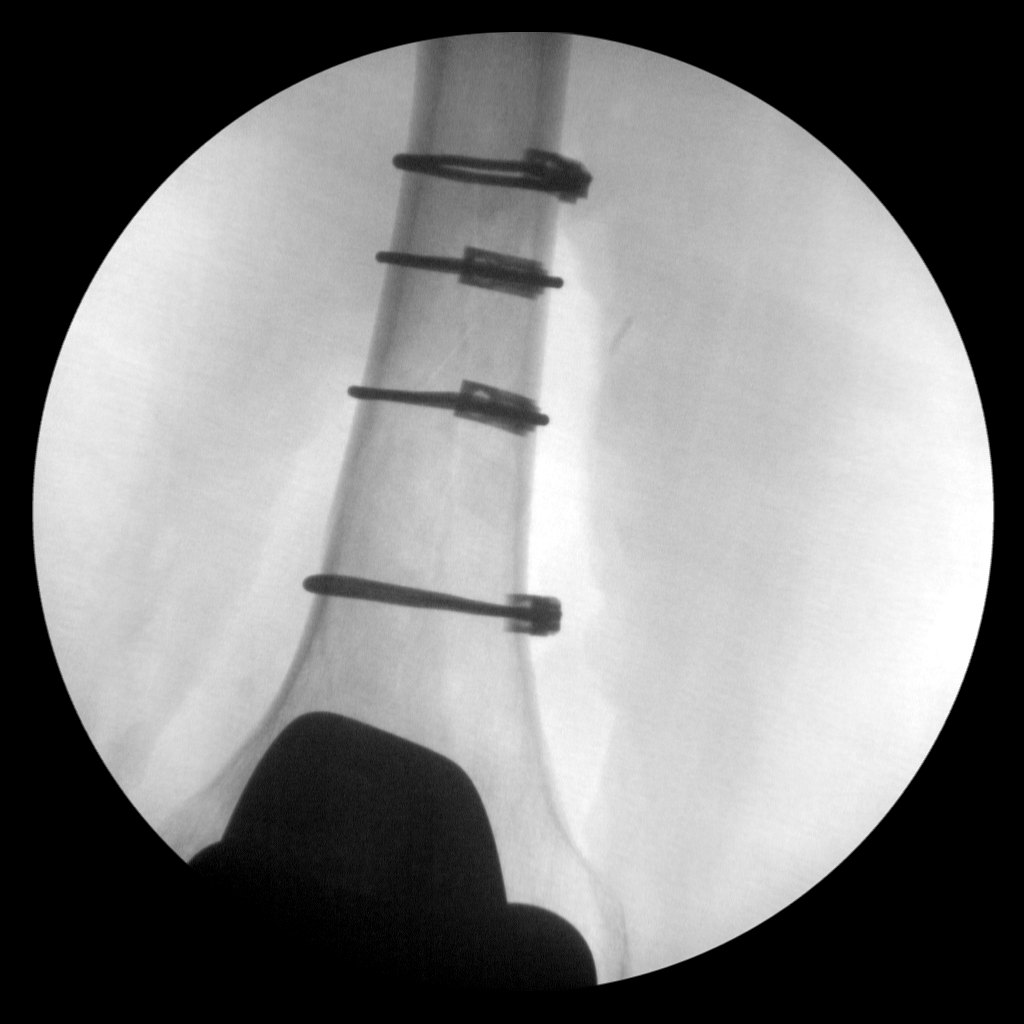
[im 2/3]
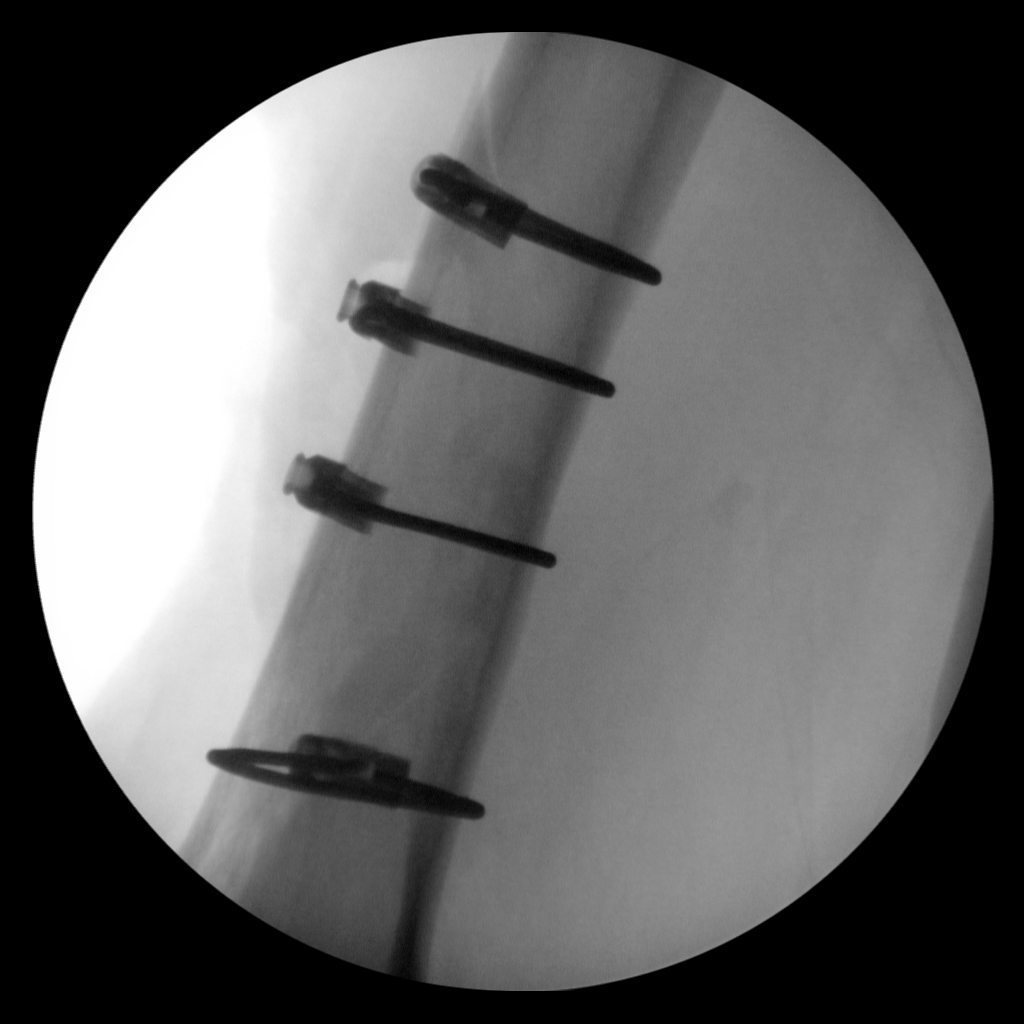
[im 3/3]
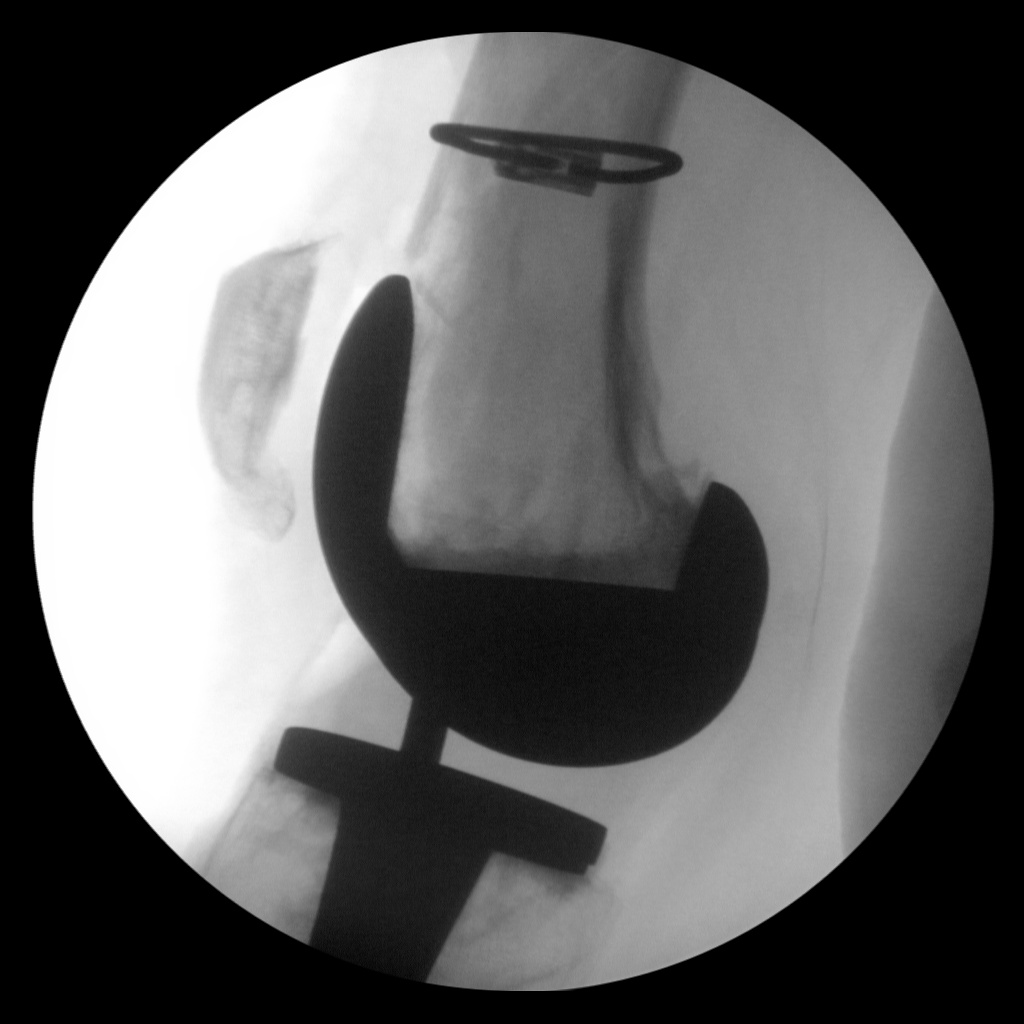

[3 of 3 positions shown; findings below may reference images not displayed]

FINDINGS: Three fluoroscopic spot views obtained in the operating room in
frontal and lateral projections demonstrate cerclage wires fixating
distal femur fracture. Fracture is in improved anatomic alignment.
Right knee arthroplasty partially visualized. Total fluoroscopy time
9 seconds. Dose 0.70 mGy.
IMPRESSION: Procedural fluoroscopy post cerclage wire fixation of distal femur
fracture.

## 2018-12-17 ENCOUNTER — Inpatient Hospital Stay (HOSPITAL_BASED_OUTPATIENT_CLINIC_OR_DEPARTMENT_OTHER): Payer: Medicare Other | Admitting: Hematology and Oncology

## 2018-12-17 ENCOUNTER — Inpatient Hospital Stay: Payer: Medicare Other | Attending: Hematology and Oncology

## 2018-12-17 ENCOUNTER — Encounter: Payer: Self-pay | Admitting: Hematology and Oncology

## 2018-12-17 ENCOUNTER — Inpatient Hospital Stay: Payer: Medicare Other

## 2018-12-17 DIAGNOSIS — Z79899 Other long term (current) drug therapy: Secondary | ICD-10-CM | POA: Diagnosis not present

## 2018-12-17 DIAGNOSIS — D469 Myelodysplastic syndrome, unspecified: Secondary | ICD-10-CM

## 2018-12-17 DIAGNOSIS — F0150 Vascular dementia without behavioral disturbance: Secondary | ICD-10-CM | POA: Diagnosis not present

## 2018-12-17 LAB — CBC WITH DIFFERENTIAL/PLATELET
Abs Immature Granulocytes: 0.15 10*3/uL — ABNORMAL HIGH (ref 0.00–0.07)
Basophils Absolute: 0 10*3/uL (ref 0.0–0.1)
Basophils Relative: 0 %
Eosinophils Absolute: 0.1 10*3/uL (ref 0.0–0.5)
Eosinophils Relative: 1 %
HCT: 37.9 % (ref 36.0–46.0)
HEMOGLOBIN: 11.6 g/dL — AB (ref 12.0–15.0)
Immature Granulocytes: 2 %
Lymphocytes Relative: 21 %
Lymphs Abs: 1.9 10*3/uL (ref 0.7–4.0)
MCH: 27 pg (ref 26.0–34.0)
MCHC: 30.6 g/dL (ref 30.0–36.0)
MCV: 88.3 fL (ref 80.0–100.0)
Monocytes Absolute: 0.8 10*3/uL (ref 0.1–1.0)
Monocytes Relative: 9 %
Neutro Abs: 6 10*3/uL (ref 1.7–7.7)
Neutrophils Relative %: 67 %
Platelets: 278 10*3/uL (ref 150–400)
RBC: 4.29 MIL/uL (ref 3.87–5.11)
RDW: 17.8 % — ABNORMAL HIGH (ref 11.5–15.5)
WBC: 9 10*3/uL (ref 4.0–10.5)
nRBC: 0 % (ref 0.0–0.2)

## 2018-12-17 NOTE — Assessment & Plan Note (Signed)
She has chronic dementia, stable I recommend her son to come along with all her future visit. We discussed the risk and benefits of pursuing further treatment at her age, and her significant comorbidities. I have not made a return appointment for the patient to come back. I recommend her son to continue to work with palliative care physicians to address goals of care long-term

## 2018-12-17 NOTE — Progress Notes (Signed)
[facility] OFFICE PROGRESS NOTE  Patient Care Team: System, Pcp Not In as PCP - General Milus Banister, MD as Consulting Physician (Gastroenterology) Heath Lark, MD as Consulting Physician (Hematology and Oncology) Mast, Man X, NP as Nurse Practitioner (Internal Medicine)  ASSESSMENT & PLAN:  Myelodysplastic syndrome ([name]) Her hemoglobin is normal She does not need darbepoetin injection I discussed the role of ESA with her son Given her significant comorbidities, I do not believe that she would benefit from this treatment However, if she improves in the future and would like to resume treatment, we will reschedule her to come back.  I recommend CBC monitoring only if needed, through her primary care doctor at her facility   Vascular dementia without behavioral disturbance [facility]) She has chronic dementia, stable I recommend her son to come along with all her future visit. We discussed the risk and benefits of pursuing further treatment at her age, and her significant comorbidities. I have not made a return appointment for the patient to come back. I recommend her son to continue to work with palliative care physicians to address goals of care long-term   No orders of the defined types were placed in this encounter.   INTERVAL HISTORY: Please see below for problem oriented charting. She returns with her son for further follow-up.  She came today with a stretcher.  She had recent fall and unable to move.  She seems alert but not fully oriented.  She recognize her son Her son stated that her symptoms improve and her mental status waxes and wane She has no recent reported bleeding or infection  SUMMARY OF ONCOLOGIC HISTORY:  I reviewed the patient's records extensive and collaborated the history with the patient. Summary of her history is as follows: This is a pleasant lady with a myeloproliferative disorder with dysplastic features. She was diagnosed in May of 2008  when she presented with unexplained normochromic anemia. Bone marrow aspiration and biopsy was done on 04/23/2007. Marrow was hypercellular for age 28-80% Maturation was normal in all 3 cell lines and there were only 1% blasts. Cytogenetics were normal including FISH studies to look at chromosome 5 and chromosome 7 deletions which were not present. Hemoglobin was 9.7 at the time with MCV 90, Baetz count 4700, and platelets 162,000. There were no ringed sideroblasts.  She was initially followed with observation alone until hemoglobin fell to 8.6 by June of 2010. She was started on a trial of Aranesp and had a nice response with rise in hemoglobin up to 12.5 g. The patient is noted to have dementia. She has not received any injection for many months as her hemoglobin has been elevated at greater than 10 g. She is not symptomatic. The last Aranesp injection was on 10/04/2014 It was noted that her hemoglobin started to trend down again and Aranesp injection is resumed on 08/27/2018 Between September to December 2019, she had numerous hospitalization  REVIEW OF SYSTEMS: Unable to assess due to dementia  I have reviewed the past medical history, past surgical history, social history and family history with the patient and they are unchanged from previous note.  ALLERGIES:  is allergic to lisinopril and penicillins.  MEDICATIONS:  Current Outpatient Medications  Medication Sig Dispense Refill  . acetaminophen (TYLENOL) 500 MG tablet Take 500 mg by mouth every 8 (eight) hours as needed for mild pain.  30 tablet   . cycloSPORINE (RESTASIS) 0.05 % ophthalmic emulsion Place 1 drop into both eyes 2 (two)  times daily.      Marland Kitchen docusate sodium (COLACE) 100 MG capsule Take 1 capsule (100 mg total) by mouth 2 (two) times daily. 60 capsule 0  . donepezil (ARICEPT) 10 MG tablet Take 10 mg by mouth at bedtime.    . ferrous sulfate 325 (65 FE) MG tablet Take 1 tablet (325 mg total) by mouth 3 (three) times daily after  meals.  3  . hydroxypropyl methylcellulose / hypromellose (ISOPTO TEARS / GONIOVISC) 2.5 % ophthalmic solution Place 1 drop into both eyes every 2 (two) hours as needed for dry eyes.    Marland Kitchen levothyroxine (SYNTHROID, LEVOTHROID) 88 MCG tablet Take 1 tablet (88 mcg total) by mouth daily before breakfast.    . Metoprolol Tartrate 37.5 MG TABS Take 37.5 mg by mouth 2 (two) times daily.    Marland Kitchen NUTRITIONAL SUPPLEMENT LIQD Take 1 Magic Cup by mouth at dinner    . Nutritional Supplements (ARGINAID EXTRA) LIQD Take 1 Container by mouth daily.    . Nutritional Supplements (RESOURCE PO) Take 90 mLs by mouth 3 (three) times daily.    Marland Kitchen oxyCODONE (ROXICODONE) 5 MG immediate release tablet Take 0.5 tablets (2.5 mg total) by mouth every 8 (eight) hours as needed. 20 tablet 0  . OXYGEN Inhale 2 L into the lungs continuous. To keep sat above 90%    . polyethylene glycol powder (GLYCOLAX/MIRALAX) powder Take 17 g by mouth daily as needed for mild constipation.     . Sodium Fluoride (PREVIDENT 5000 BOOSTER PLUS) 1.1 % PSTE Place 1 application onto teeth every morning. Brush on to teeth as directed    . zinc oxide 20 % ointment Apply 1 application topically See admin instructions. Apply Zinc Oxide to buttocks topically after every incontinent episode and as needed. May keep at bedside      No current facility-administered medications for this visit.     PHYSICAL EXAMINATION: ECOG PERFORMANCE STATUS: 4 - Bedbound  Vitals:   12/17/18 1142  BP: (!) 116/50  Pulse: 64  Resp: 18  SpO2: 100%   Filed Weights   12/17/18 1142  Weight: 121 lb 11.2 oz (55.2 kg)    GENERAL:alert, no distress and comfortable.  She is examined lying on the stretcher NEURO: alert but not oriented, with fluent speech  LABORATORY DATA:  I have reviewed the data as listed    Component Value Date/Time   NA 143 12/03/2018   NA 139 08/24/2018   NA 142 08/06/2016 1059   K 4.5 12/03/2018   K 5.1 08/24/2018   K 4.4 08/06/2016 1059   CL  107 11/13/2018 0514   CL 106 08/04/2018   CL 109 (H) 04/20/2013 1320   CO2 24 11/13/2018 0514   CO2 9.7 08/04/2018   CO2 23 08/06/2016 1059   GLUCOSE 94 11/13/2018 0514   GLUCOSE 102 08/06/2016 1059   GLUCOSE 129 (H) 04/20/2013 1320   BUN 25 (A) 12/03/2018   BUN 35.0 (H) 08/06/2016 1059   CREATININE 0.9 12/03/2018   CREATININE 0.78 11/13/2018 0514   CREATININE 1.67 08/24/2018   CREATININE 1.5 (H) 08/06/2016 1059   CALCIUM 8.8 (L) 11/13/2018 0514   CALCIUM 9.0 08/24/2018   CALCIUM 10.4 08/06/2016 1059   PROT 5.2 (L) 09/21/2018 0645   PROT 5.5 08/24/2018   PROT 6.7 08/06/2016 1059   ALBUMIN 2.2 (L) 09/21/2018 0645   ALBUMIN 3.5 08/24/2018   ALBUMIN 3.9 08/06/2016 1059   AST 20 09/21/2018 0645   AST 15 08/24/2018   AST  19 08/06/2016 1059   ALT 25 09/21/2018 0645   ALT 19 08/24/2018   ALT 24 08/06/2016 1059   ALKPHOS 64 09/21/2018 0645   ALKPHOS 75 08/24/2018   ALKPHOS 69 08/06/2016 1059   BILITOT 0.6 09/21/2018 0645   BILITOT 0.3 08/24/2018   BILITOT 0.61 08/06/2016 1059   GFRNONAA >60 11/13/2018 0514   GFRNONAA 26 08/24/2018   GFRAA >60 11/13/2018 0514    No results found for: SPEP, UPEP  Lab Results  Component Value Date   WBC 9.0 12/17/2018   NEUTROABS 6.0 12/17/2018   HGB 11.6 (L) 12/17/2018   HCT 37.9 12/17/2018   MCV 88.3 12/17/2018   PLT 278 12/17/2018      Chemistry      Component Value Date/Time   NA 143 12/03/2018   NA 139 08/24/2018   NA 142 08/06/2016 1059   K 4.5 12/03/2018   K 5.1 08/24/2018   K 4.4 08/06/2016 1059   CL 107 11/13/2018 0514   CL 106 08/04/2018   CL 109 (H) 04/20/2013 1320   CO2 24 11/13/2018 0514   CO2 9.7 08/04/2018   CO2 23 08/06/2016 1059   BUN 25 (A) 12/03/2018   BUN 35.0 (H) 08/06/2016 1059   CREATININE 0.9 12/03/2018   CREATININE 0.78 11/13/2018 0514   CREATININE 1.67 08/24/2018   CREATININE 1.5 (H) 08/06/2016 1059   GLU 83 12/03/2018      Component Value Date/Time   CALCIUM 8.8 (L) 11/13/2018 0514    CALCIUM 9.0 08/24/2018   CALCIUM 10.4 08/06/2016 1059   ALKPHOS 64 09/21/2018 0645   ALKPHOS 75 08/24/2018   ALKPHOS 69 08/06/2016 1059   AST 20 09/21/2018 0645   AST 15 08/24/2018   AST 19 08/06/2016 1059   ALT 25 09/21/2018 0645   ALT 19 08/24/2018   ALT 24 08/06/2016 1059   BILITOT 0.6 09/21/2018 0645   BILITOT 0.3 08/24/2018   BILITOT 0.61 08/06/2016 1059       All questions were answered. The patient knows to call the clinic with any problems, questions or concerns. No barriers to learning was detected.  I spent 15 minutes counseling the patient face to face. The total time spent in the appointment was 20 minutes and more than 50% was on counseling and review of test results  Heath Lark, MD 12/17/2018 12:58 PM

## 2018-12-17 NOTE — Assessment & Plan Note (Signed)
Her hemoglobin is normal She does not need darbepoetin injection I discussed the role of ESA with her son Given her significant comorbidities, I do not believe that she would benefit from this treatment However, if she improves in the future and would like to resume treatment, we will reschedule her to come back.  I recommend CBC monitoring only if needed, through her primary care doctor at her facility

## 2018-12-18 ENCOUNTER — Telehealth: Payer: Self-pay | Admitting: Hematology and Oncology

## 2018-12-18 NOTE — Telephone Encounter (Signed)
Per 1/2 no new orders

## 2018-12-22 ENCOUNTER — Encounter: Payer: Self-pay | Admitting: Nurse Practitioner

## 2018-12-22 ENCOUNTER — Non-Acute Institutional Stay (SKILLED_NURSING_FACILITY): Payer: Medicare Other | Admitting: Nurse Practitioner

## 2018-12-22 DIAGNOSIS — K5900 Constipation, unspecified: Secondary | ICD-10-CM

## 2018-12-22 DIAGNOSIS — E46 Unspecified protein-calorie malnutrition: Secondary | ICD-10-CM | POA: Insufficient documentation

## 2018-12-22 DIAGNOSIS — D469 Myelodysplastic syndrome, unspecified: Secondary | ICD-10-CM

## 2018-12-22 DIAGNOSIS — I10 Essential (primary) hypertension: Secondary | ICD-10-CM | POA: Diagnosis not present

## 2018-12-22 DIAGNOSIS — E039 Hypothyroidism, unspecified: Secondary | ICD-10-CM

## 2018-12-22 DIAGNOSIS — F0150 Vascular dementia without behavioral disturbance: Secondary | ICD-10-CM

## 2018-12-22 DIAGNOSIS — N183 Chronic kidney disease, stage 3 unspecified: Secondary | ICD-10-CM

## 2018-12-22 DIAGNOSIS — S72401A Unspecified fracture of lower end of right femur, initial encounter for closed fracture: Secondary | ICD-10-CM

## 2018-12-22 DIAGNOSIS — R634 Abnormal weight loss: Secondary | ICD-10-CM

## 2018-12-22 NOTE — Assessment & Plan Note (Signed)
Continue SNF [facility] for safety and care assistance, continue Donepezil 10mg qd.  

## 2018-12-22 NOTE — Assessment & Plan Note (Signed)
Stable, continue Colace 100mg  bid.

## 2018-12-22 NOTE — Assessment & Plan Note (Addendum)
Healing nicely. Continue long boot, continue Tylenol 500mg  bid.

## 2018-12-22 NOTE — Assessment & Plan Note (Signed)
Stable, continue Mirtazapine 7.5mg  qd.

## 2018-12-22 NOTE — Assessment & Plan Note (Signed)
Stable, last Hgb 11.6 12/04/18, continue Fe 325mg  tid.

## 2018-12-22 NOTE — Assessment & Plan Note (Signed)
Stable, last creat 0.9 12/03/18

## 2018-12-22 NOTE — Assessment & Plan Note (Signed)
Last TP 5.2, albumin 2.2 09/21/18, continue palliative care, dietary supplement.

## 2018-12-22 NOTE — Progress Notes (Signed)
Location:  [facility] Room Number: 26 A Place of Service:  SNF (31) Provider:  Garreth Burnsworth, Lennie Odor  NP  Virgie Dad, MD  Patient Care Team: Virgie Dad, MD as PCP - General (Internal Medicine) Milus Banister, MD as Consulting Physician (Gastroenterology) Heath Lark, MD as Consulting Physician (Hematology and Oncology) Maika Kaczmarek X, NP as Nurse Practitioner (Internal Medicine)  Extended Emergency Contact Information Primary Emergency Contact: [facility] Address: [place], [place] [address] [name] of [place] Phone: [phone] Relation: Son Secondary Emergency Contact: [name],[name] Address: [address] [place] [address] [name] of [place] Phone: [phone] Relation: Daughter  Code Status:  DNR Goals of care: Advanced Directive information Advanced Directives 12/22/2018  Does Patient Have a Medical Advance Directive? Yes  Type of Paramedic of [place];Out of facility DNR (pink MOST or yellow form);Living will  Does patient want to make changes to medical advance directive? No - Patient declined  Copy of [place] in Chart? Yes - validated most recent copy scanned in chart (See row information)  Would patient like information on creating a medical advance directive? -  Pre-existing out of facility DNR order (yellow form or pink MOST form) Yellow form placed in chart (order not valid for inpatient use)     Chief Complaint  Patient presents with  . Medical Management of Chronic Issues    HPI:  Pt is a 85 y.o. female seen today for medical management of chronic diseases.     The patient resides in [facility] for palliative care, Foley Cath, long boot right leg, on Tylenol 500mg  bid. Hx of anemia, last Hgb 11.6 12/17/18, on Fe 325mg  tid.. Malnutrition, TP 5.2, albumin 2.2 09/21/18. Her mood is stable on Mirtazapine 7.5mg  qd, weight has been  stabilized. HTN, blood pressure is controlled on Metoprolol 37.5mg  bid. Hypothyroidism, on Levothyroxine 35mcg qd, last TSH 1.88 08/11/18. Her memory is preserved on Donepezil 10mg  qd. No constipation while on Colace 100mg  bid.  Past Medical History:  Diagnosis Date  . Blood transfusion 2006  . Colon polyps   . CVA (cerebral infarction) 2013  . Dementia ([place])   . Diverticulosis of colon   . DJD (degenerative joint disease)   . Hyperlipidemia   . Hypertension   . Hypothyroidism   . Melanoma ([place]) 03/20/15   removed with Moh's surgery  . Myelodysplasia 02/04/2012  . Normochromic normocytic anemia 02/04/2012  . Onychomycosis   . Osteoporosis   . Rectal prolapse   . Shoulder pain   . Unstable gait    Past Surgical History:  Procedure Laterality Date  . APPENDECTOMY    . CARPAL TUNNEL RELEASE Bilateral 2006  . FEMUR IM NAIL Right 11/09/2018   Procedure: INTRAMEDULLARY (IM) RETROGRADE FEMORAL NAILING;  Surgeon: Paralee Cancel, MD;  Location: [facility];  Service: Orthopedics;  Laterality: Right;  . FLEXIBLE SIGMOIDOSCOPY  07/09/2012   Procedure: FLEXIBLE SIGMOIDOSCOPY;  Surgeon: Inda Castle, MD;  Location: [facility];  Service: Endoscopy;  Laterality: N/A;  . INCISION AND DRAINAGE PERIRECTAL ABSCESS N/A 09/21/2018   Procedure: IRRIGATION AND DEBRIDEMENT PERIRECTAL ABSCESS;  Surgeon: Rolm Bookbinder, MD;  Location: [facility];  Service: General;  Laterality: N/A;  . MELANOMA EXCISION Right 2005   arm  . POLYPECTOMY  2004   Laparoscopic  . RECTOCELE REPAIR    .  REPLACEMENT TOTAL KNEE  2005 & 2006  . VAGINAL HYSTERECTOMY  1989    Allergies  Allergen Reactions  . Lisinopril Swelling  . Penicillins Rash    Has patient had a PCN reaction causing immediate rash, facial/tongue/throat swelling, SOB or lightheadedness with hypotension: Unknown Has patient had a PCN reaction causing severe rash involving mucus membranes or skin necrosis: Unknown Has patient had a PCN reaction that required  hospitalization: Unknown Has patient had a PCN reaction occurring within the last 10 years: Unknown If all of the above answers are "NO", then may proceed with Cephalosporin use.    Outpatient Encounter Medications as of 12/22/2018  Medication Sig  . acetaminophen (TYLENOL) 500 MG tablet Take 500 mg by mouth 2 (two) times daily.   Marland Kitchen acetaminophen (TYLENOL) 500 MG tablet Take 500 mg by mouth every 8 (eight) hours as needed.  Marland Kitchen aspirin 81 MG EC tablet Take 1 tablet by mouth daily.  . cycloSPORINE (RESTASIS) 0.05 % ophthalmic emulsion Place 1 drop into both eyes 2 (two) times daily.    Marland Kitchen docusate sodium (COLACE) 100 MG capsule Take 1 capsule (100 mg total) by mouth 2 (two) times daily.  Marland Kitchen donepezil (ARICEPT) 10 MG tablet Take 10 mg by mouth at bedtime.  . feeding supplement (BOOST / RESOURCE BREEZE) LIQD Take 1 Container by mouth 2 (two) times daily between meals.  . ferrous sulfate 325 (65 FE) MG tablet Take 1 tablet (325 mg total) by mouth 3 (three) times daily after meals.  . hydroxypropyl methylcellulose / hypromellose (ISOPTO TEARS / GONIOVISC) 2.5 % ophthalmic solution Place 1 drop into both eyes every 2 (two) hours as needed for dry eyes.  Marland Kitchen levothyroxine (SYNTHROID, LEVOTHROID) 88 MCG tablet Take 1 tablet (88 mcg total) by mouth daily before breakfast.  . Metoprolol Tartrate 37.5 MG TABS Take 37.5 mg by mouth 2 (two) times daily.  . mirtazapine (REMERON) 7.5 MG tablet Take 7.5 mg by mouth daily.  Marland Kitchen NUTRITIONAL SUPPLEMENT LIQD Take 1 Magic Cup by mouth at dinner  . Nutritional Supplements (RESOURCE PO) Take 90 mLs by mouth 3 (three) times daily.  Marland Kitchen oxyCODONE (ROXICODONE) 5 MG immediate release tablet Take 0.5 tablets (2.5 mg total) by mouth every 8 (eight) hours as needed.  . OXYGEN Inhale 2 L into the lungs continuous. To keep sat above 90%  . polyethylene glycol powder (GLYCOLAX/MIRALAX) powder Take 17 g by mouth daily as needed for mild constipation.   . Sodium Fluoride (PREVIDENT 5000  BOOSTER PLUS) 1.1 % PSTE Place 1 application onto teeth every morning. Brush on to teeth as directed  . zinc oxide 20 % ointment Apply 1 application topically See admin instructions. Apply Zinc Oxide to buttocks topically after every incontinent episode and as needed. May keep at bedside   . [DISCONTINUED] Nutritional Supplements (ARGINAID EXTRA) LIQD Take 1 Container by mouth daily.   No facility-administered encounter medications on file as of 12/22/2018.    ROS was provided with assistance of staff Review of Systems  Constitutional: Negative for activity change, appetite change, chills, diaphoresis, fatigue, fever and unexpected weight change.  HENT: Positive for hearing loss. Negative for congestion and voice change.   Respiratory: Negative for cough, shortness of breath and wheezing.   Cardiovascular: Negative for chest pain, palpitations and leg swelling.  Gastrointestinal: Negative for abdominal distention, abdominal pain, constipation, diarrhea, nausea and vomiting.  Genitourinary: Negative for difficulty urinating, dysuria and urgency.  Musculoskeletal: Positive for arthralgias and gait problem.  Skin: Negative for  color change and pallor.  Neurological: Negative for dizziness, speech difficulty, weakness and headaches.       Dementia  Psychiatric/Behavioral: Positive for confusion and decreased concentration. Negative for agitation, behavioral problems, hallucinations and sleep disturbance. The patient is not nervous/anxious.     Immunization History  Administered Date(s) Administered  . Influenza Split 10/17/2011, 09/14/2012  . Influenza Whole 10/17/2007  . Influenza, High Dose Seasonal PF 10/11/2013, 10/05/2014  . Influenza,inj,Quad PF,6+ Mos 10/05/2018  . Influenza-Unspecified 09/05/2015, 10/02/2016, 10/06/2017  . PPD Test 01/12/2015  . Pneumococcal Conjugate-13 09/29/2014  . Pneumococcal Polysaccharide-23 12/16/1998  . Td 11/10/2018  . Tdap 04/23/2011  . Zoster 07/04/2006    Pertinent  Health Maintenance Due  Topic Date Due  . OPHTHALMOLOGY EXAM  11/15/2014  . FOOT EXAM  11/30/2014  . URINE MICROALBUMIN  11/30/2014  . HEMOGLOBIN A1C  10/11/2015  . INFLUENZA VACCINE  Completed  . DEXA SCAN  Completed  . PNA vac Low Risk Adult  Completed   Fall Risk  10/06/2018 10/05/2018 09/02/2017 07/04/2016 01/04/2016  Falls in the past year? No No No No No   Functional Status Survey:    Vitals:   12/22/18 1140  BP: (!) 110/56  Pulse: 78  Resp: 18  Temp: (!) 97 F (36.1 C)  SpO2: 98%  Weight: 119 lb 12.8 oz (54.3 kg)  Height: 4\' 11"  (1.499 m)   Body mass index is 24.2 kg/m. Physical Exam Constitutional:      General: She is not in acute distress.    Appearance: Normal appearance. She is normal weight. She is not ill-appearing, toxic-appearing or diaphoretic.  HENT:     Head: Normocephalic and atraumatic.     Nose: Nose normal.     Mouth/Throat:     Mouth: Mucous membranes are moist.  Eyes:     Extraocular Movements: Extraocular movements intact.     Pupils: Pupils are equal, round, and reactive to light.  Neck:     Musculoskeletal: Normal range of motion and neck supple.  Cardiovascular:     Rate and Rhythm: Normal rate and regular rhythm.     Heart sounds: Murmur present.  Pulmonary:     Effort: Pulmonary effort is normal.     Breath sounds: Normal breath sounds. No wheezing, rhonchi or rales.  Abdominal:     General: There is no distension.     Palpations: Abdomen is soft.     Tenderness: There is no abdominal tenderness. There is no guarding or rebound.  Genitourinary:    Comments: Foley cath Musculoskeletal:     Right lower leg: No edema.     Left lower leg: No edema.     Comments: Right knee immobilizer.  Needs assistance for transfer. W/c for mobility.   Skin:    General: Skin is warm and dry.     Coloration: Skin is not pale.     Findings: No erythema or rash.     Comments: Right gluteal wound from healing abscess, packing, no  odorous drainage upon my exam.   Neurological:     General: No focal deficit present.     Mental Status: She is alert. Mental status is at baseline.     Cranial Nerves: No cranial nerve deficit.     Motor: No weakness.     Coordination: Coordination normal.     Gait: Gait abnormal.     Comments: Oriented to self.   Psychiatric:        Mood and Affect: Mood normal.  Behavior: Behavior normal.     Labs reviewed: Recent Labs    09/24/18 0330 09/25/18 0539  11/11/18 0503 11/12/18 0518 11/13/18 0514 12/03/18  NA 142 141   < > 142 140 139 143  K 4.7 4.6   < > 5.1 4.3 4.2 4.5  CL 109 105   < > 111 113* 107  --   CO2 28 30   < > 21* 21* 24  --   GLUCOSE 101* 89   < > 115* 85 94  --   BUN 19 14   < > 42* 32* 24* 25*  CREATININE 0.87 0.86   < > 1.05* 0.91 0.78 0.9  CALCIUM 8.7* 8.8*   < > 8.8* 8.7* 8.8*  --   MG 1.5* 1.7  --  1.7  --   --   --    < > = values in this interval not displayed.   Recent Labs    09/17/18 0445 09/20/18 0706 09/21/18 0645  AST 43* 17 20  ALT 61* 31 25  ALKPHOS 97 72 64  BILITOT 1.0 1.1 0.6  PROT 5.2* 4.9* 5.2*  ALBUMIN 2.3* 2.2* 2.2*   Recent Labs    11/09/18 1424  11/13/18 0514 11/19/18 1006 12/17/18 1058  WBC 12.5*   < > 9.6 7.3 9.0  NEUTROABS 8.5*  --   --  4.1 6.0  HGB 10.3*   < > 9.9* 9.7* 11.6*  HCT 34.1*   < > 31.0* 30.9* 37.9  MCV 91.2   < > 90.1 92.8 88.3  PLT 283   < > 337 290 278   < > = values in this interval not displayed.   Lab Results  Component Value Date   TSH 1.88 08/11/2018   Lab Results  Component Value Date   HGBA1C 5.6 04/11/2015   Lab Results  Component Value Date   CHOL 147 04/22/2017   HDL 47 04/22/2017   LDLCALC 77 04/22/2017   LDLDIRECT 131.2 11/30/2013   TRIG 125 04/22/2017   CHOLHDL 4 11/30/2013    Significant Diagnostic Results in last 30 days:  No results found.  Assessment/Plan Essential hypertension Controlled, continue Metoprolol 37.5mg  bid.   Hypothyroidism Stable, continue  Levothyroxine 14mcg qd, last TSH 1.88 08/11/18  Vascular dementia without behavioral disturbance ([place]) Continue SNF [facility] for safety and care assistance, continue Donepezil 10mg  qd.   Femur fracture, right ([place]) Healing nicely. Continue long boot, continue Tylenol 500mg  bid.   CKD (chronic kidney disease) stage 3, GFR 30-59 ml/min ([facility]) Stable, last creat 0.9 12/03/18  Myelodysplastic syndrome ([facility]) Stable, last Hgb 11.6 12/04/18, continue Fe 325mg  tid.   Constipation Stable, continue Colace 100mg  bid.   Protein-calorie malnutrition ([facility]) Last TP 5.2, albumin 2.2 09/21/18, continue palliative care, dietary supplement.   Weight loss Stable, continue Mirtazapine 7.5mg  qd.      Family/ staff Communication: plan of care reviewed with the patient and charge nurse.   Labs/tests ordered:  none  Time spend 25 minutes.

## 2018-12-22 NOTE — Assessment & Plan Note (Signed)
Stable, continue Levothyroxine 71mcg qd, last TSH 1.88 08/11/18

## 2018-12-22 NOTE — Assessment & Plan Note (Signed)
Controlled, continue Metoprolol 37.5mg  bid.

## 2018-12-24 ENCOUNTER — Encounter (HOSPITAL_BASED_OUTPATIENT_CLINIC_OR_DEPARTMENT_OTHER): Payer: Medicare Other | Attending: Internal Medicine

## 2018-12-24 DIAGNOSIS — N186 End stage renal disease: Secondary | ICD-10-CM | POA: Diagnosis not present

## 2018-12-24 DIAGNOSIS — I120 Hypertensive chronic kidney disease with stage 5 chronic kidney disease or end stage renal disease: Secondary | ICD-10-CM | POA: Insufficient documentation

## 2018-12-24 DIAGNOSIS — F0390 Unspecified dementia without behavioral disturbance: Secondary | ICD-10-CM | POA: Insufficient documentation

## 2018-12-24 DIAGNOSIS — T8189XA Other complications of procedures, not elsewhere classified, initial encounter: Secondary | ICD-10-CM | POA: Diagnosis present

## 2018-12-24 DIAGNOSIS — Y838 Other surgical procedures as the cause of abnormal reaction of the patient, or of later complication, without mention of misadventure at the time of the procedure: Secondary | ICD-10-CM | POA: Diagnosis not present

## 2018-12-24 DIAGNOSIS — T8131XA Disruption of external operation (surgical) wound, not elsewhere classified, initial encounter: Secondary | ICD-10-CM | POA: Diagnosis not present

## 2018-12-29 ENCOUNTER — Encounter: Payer: Self-pay | Admitting: Nurse Practitioner

## 2019-01-11 ENCOUNTER — Non-Acute Institutional Stay: Payer: Medicare Other | Admitting: Nurse Practitioner

## 2019-01-14 ENCOUNTER — Non-Acute Institutional Stay: Payer: Medicare Other | Admitting: Nurse Practitioner

## 2019-01-14 ENCOUNTER — Encounter: Payer: Self-pay | Admitting: Nurse Practitioner

## 2019-01-14 VITALS — Wt 119.0 lb

## 2019-01-14 DIAGNOSIS — E46 Unspecified protein-calorie malnutrition: Secondary | ICD-10-CM

## 2019-01-14 DIAGNOSIS — R634 Abnormal weight loss: Secondary | ICD-10-CM

## 2019-01-14 DIAGNOSIS — R2681 Unsteadiness on feet: Secondary | ICD-10-CM

## 2019-01-14 DIAGNOSIS — F0150 Vascular dementia without behavioral disturbance: Secondary | ICD-10-CM

## 2019-01-14 DIAGNOSIS — Z515 Encounter for palliative care: Secondary | ICD-10-CM

## 2019-01-14 NOTE — Progress Notes (Signed)
PALLIATIVE CARE CONSULT VISIT   PATIENT NAME: Brittany [name] DOB: 1924/12/11 MRN: [id number]   [facility] Room 28  PRIMARY CARE PROVIDER:   Su Ley   Patient Care Team Oncology Dr. Dixie Dials (cardiology) [place]:   Berkley Wrightsman [phone]   HISTORY OF PRESENT ILLNESS:  Brittany [name] is a 85 y.o. year old female with multiple medical problems including recent right femur IM nail for stabilization of Right distal periprosthetic femur fracture s/p traumatic fall;  sepsis 2/2 left gluteal cellulitis/abscess s/p I&D,  RLL PNA ; Diverticulitis ; atrial fibrillation   vascular dementia without behaviorial disturbance;  Palliative Care was asked to help with symptom management address goals of care.   Interim history from staff and patient: patient more alert and interactive; wound from surgery is healing well; appetite is fair weight is stable; patient denies pain  ASSESSMENT/RECOMMENDATIONS/PLAN  Pulmonary fibrosis oxygen dependent -continue same -continue same breathing at baseline  -s/p stabilization of right femur fracture with IMN on 11/09/18 -Right femur fracture s/p fall 11/16 -right temporal laceration s/p fall 11/10 -Pain due to fracture and CHI -continue same per Ortho recommendations -F/U with Orthopedic recommendations  -ASA for DVT ppx -pain controlled with acetaminophen  Vascular dementia without behavioral disturbance -continue same -oriented to self; staff note worsening of cognition since readmission -continue donepezil  HTN HLD Hypothyroidism Stage III CKD -continue same regimen per PCP -metoprolol, cardizem and losartan -synthroid 51mcgs   left gluteal ulcer 2/2 previous cellulitis/abscess s/p I&D -f/u with [facility] -Patient being treated and followed by [facility]  myelodysplastic syndrome Pancytopenia   -Per Oncology Last OV 12/17/18 -HGB stable no need for further treament -Followed  by Oncology -continue ferrous  sulfate   pAtrial fibrillaiton -per PCP and cardiology -followed by cardiology -continue metoprolol, and cardizem  Protein-Calorie malnutrition  -continue supplements    -weight stable at 121.5 -continue supplements  ACP  - DNR/MOST-limited; give antibiotic's and IVF's no feeding tubes   I spent 15 minutes providing this consultation,  from 12:30 to 12:45. More than 50% of the time in this consultation was spent coordinating communication.   CODE STATUS: DNR/MOST  limited, give antibiotic's and IVF ;no feeding tube  PPS: 30% HOSPICE ELIGIBILITY/DIAGNOSIS: TBD  PAST MEDICAL HISTORY:  Past Medical History:  Diagnosis Date  . Blood transfusion 2006  . Colon polyps   . CVA (cerebral infarction) 2013  . Dementia ([place])   . Diverticulosis of colon   . DJD (degenerative joint disease)   . Hyperlipidemia   . Hypertension   . Hypothyroidism   . Melanoma ([place]) 03/20/15   removed with Moh's surgery  . Myelodysplasia 02/04/2012  . Normochromic normocytic anemia 02/04/2012  . Onychomycosis   . Osteoporosis   . Rectal prolapse   . Shoulder pain   . Unstable gait     SOCIAL HX:  Social History   Tobacco Use  . Smoking status: Never Smoker  . Smokeless tobacco: Never Used  Substance Use Topics  . Alcohol use: No    ALLERGIES:  Allergies  Allergen Reactions  . Lisinopril Swelling  . Penicillins Rash    Has patient had a PCN reaction causing immediate rash, facial/tongue/throat swelling, SOB or lightheadedness with hypotension: Unknown Has patient had a PCN reaction causing severe rash involving mucus membranes or skin necrosis: Unknown Has patient had a PCN reaction that required hospitalization: Unknown Has patient had a PCN reaction occurring within the last 10 years: Unknown If  all of the above answers are "NO", then may proceed with Cephalosporin use.     PERTINENT MEDICATIONS:  Outpatient Encounter Medications as of 01/14/2019  Medication Sig  . acetaminophen (TYLENOL)  500 MG tablet Take 500 mg by mouth 2 (two) times daily.   Marland Kitchen acetaminophen (TYLENOL) 500 MG tablet Take 500 mg by mouth every 8 (eight) hours as needed.  Marland Kitchen aspirin 81 MG EC tablet Take 1 tablet by mouth daily.  . cycloSPORINE (RESTASIS) 0.05 % ophthalmic emulsion Place 1 drop into both eyes 2 (two) times daily.    Marland Kitchen docusate sodium (COLACE) 100 MG capsule Take 1 capsule (100 mg total) by mouth 2 (two) times daily.  Marland Kitchen donepezil (ARICEPT) 10 MG tablet Take 10 mg by mouth at bedtime.  . feeding supplement (BOOST / RESOURCE BREEZE) LIQD Take 1 Container by mouth 2 (two) times daily between meals.  . ferrous sulfate 325 (65 FE) MG tablet Take 1 tablet (325 mg total) by mouth 3 (three) times daily after meals.  . hydroxypropyl methylcellulose / hypromellose (ISOPTO TEARS / GONIOVISC) 2.5 % ophthalmic solution Place 1 drop into both eyes every 2 (two) hours as needed for dry eyes.  Marland Kitchen levothyroxine (SYNTHROID, LEVOTHROID) 88 MCG tablet Take 1 tablet (88 mcg total) by mouth daily before breakfast.  . Metoprolol Tartrate 37.5 MG TABS Take 37.5 mg by mouth 2 (two) times daily.  . mirtazapine (REMERON) 7.5 MG tablet Take 7.5 mg by mouth daily.  Marland Kitchen NUTRITIONAL SUPPLEMENT LIQD Take 1 Magic Cup by mouth at dinner  . Nutritional Supplements (RESOURCE PO) Take 90 mLs by mouth 3 (three) times daily.  Marland Kitchen oxyCODONE (ROXICODONE) 5 MG immediate release tablet Take 0.5 tablets (2.5 mg total) by mouth every 8 (eight) hours as needed.  . OXYGEN Inhale 2 L into the lungs continuous. To keep sat above 90%  . polyethylene glycol powder (GLYCOLAX/MIRALAX) powder Take 17 g by mouth daily as needed for mild constipation.   . Sodium Fluoride (PREVIDENT 5000 BOOSTER PLUS) 1.1 % PSTE Place 1 application onto teeth every morning. Brush on to teeth as directed  . zinc oxide 20 % ointment Apply 1 application topically See admin instructions. Apply Zinc Oxide to buttocks topically after every incontinent episode and as needed. May keep at  bedside    No facility-administered encounter medications on file as of 01/14/2019.     PHYSICAL EXAM:   General: NAD, frail appearing, thin Cardiovascular: regular rate and rhythm Pulmonary: BBA CTA Abdomen: soft, nontender, + bowel sounds GU: no suprapubic tenderness Extremities: no edema,  Skin: no rashes Neurological: Weakness but otherwise nonfocal  Stephanie G Martinique, NP

## 2019-01-22 ENCOUNTER — Encounter: Payer: Self-pay | Admitting: Nurse Practitioner

## 2019-01-22 ENCOUNTER — Non-Acute Institutional Stay (SKILLED_NURSING_FACILITY): Payer: Medicare Other | Admitting: Nurse Practitioner

## 2019-01-22 DIAGNOSIS — R634 Abnormal weight loss: Secondary | ICD-10-CM | POA: Diagnosis not present

## 2019-01-22 DIAGNOSIS — I10 Essential (primary) hypertension: Secondary | ICD-10-CM

## 2019-01-22 DIAGNOSIS — E039 Hypothyroidism, unspecified: Secondary | ICD-10-CM

## 2019-01-22 DIAGNOSIS — F0150 Vascular dementia without behavioral disturbance: Secondary | ICD-10-CM

## 2019-01-22 DIAGNOSIS — D509 Iron deficiency anemia, unspecified: Secondary | ICD-10-CM

## 2019-01-22 NOTE — Progress Notes (Signed)
Location:  [place] Room Number: 26 Place of Service:  SNF [phone]) Provider: Marlana Latus  NP  Virgie Dad, MD  Patient Care Team: Virgie Dad, MD as PCP - General (Internal Medicine) Milus Banister, MD as Consulting Physician (Gastroenterology) Heath Lark, MD as Consulting Physician (Hematology and Oncology) Mertha Clyatt X, NP as Nurse Practitioner (Internal Medicine)  Extended Emergency Contact Information Primary Emergency Contact: [facility] Address: [place], [place] [address] [name] of [place] Phone: [phone] Relation: Son Secondary Emergency Contact: [name],[name] Address: [address] [place] [address] [name] of [place] Phone: [phone] Relation: Daughter  Code Status:  DNR Goals of care: Advanced Directive information Advanced Directives 01/22/2019  Does Patient Have a Medical Advance Directive? Yes  Type of Paramedic of [place];Out of facility DNR (pink MOST or yellow form);Living will  Does patient want to make changes to medical advance directive? No - Patient declined  Copy of [place] in Chart? Yes - validated most recent copy scanned in chart (See row information)  Would patient like information on creating a medical advance directive? -  Pre-existing out of facility DNR order (yellow form or pink MOST form) Yellow form placed in chart (order not valid for inpatient use)     Chief Complaint  Patient presents with  . Medical Management of Chronic Issues    HPI:  Pt is a 85 y.o. female seen today for medical management of chronic diseases.     The patient has resides in [facility] for safety and care assistance following her right hip fracture surgery, on Donepezil 10mg  qd for memory, Tylenol 500mg  bid for pain. Weights loss, persists on  Mirtazapine 7.5mg  qd. HTN, blood pressure is controlled on Metoprolol 37.5mg   bid. Hypothyroidism, on Levothyroxine 73mcg qd, last TSH 1.88 08/11/18. Anemia, stable, on Fe 325mg  tid, last Hgb 11.6 12/17/18  Past Medical History:  Diagnosis Date  . Blood transfusion 2006  . Colon polyps   . CVA (cerebral infarction) 2013  . Dementia ([place])   . Diverticulosis of colon   . DJD (degenerative joint disease)   . Hyperlipidemia   . Hypertension   . Hypothyroidism   . Melanoma ([place]) 03/20/15   removed with Moh's surgery  . Myelodysplasia 02/04/2012  . Normochromic normocytic anemia 02/04/2012  . Onychomycosis   . Osteoporosis   . Rectal prolapse   . Shoulder pain   . Unstable gait    Past Surgical History:  Procedure Laterality Date  . APPENDECTOMY    . CARPAL TUNNEL RELEASE Bilateral 2006  . FEMUR IM NAIL Right 11/09/2018   Procedure: INTRAMEDULLARY (IM) RETROGRADE FEMORAL NAILING;  Surgeon: Paralee Cancel, MD;  Location: [facility];  Service: Orthopedics;  Laterality: Right;  . FLEXIBLE SIGMOIDOSCOPY  07/09/2012   Procedure: FLEXIBLE SIGMOIDOSCOPY;  Surgeon: Inda Castle, MD;  Location: [facility];  Service: Endoscopy;  Laterality: N/A;  . INCISION AND DRAINAGE PERIRECTAL ABSCESS N/A 09/21/2018   Procedure: IRRIGATION AND DEBRIDEMENT PERIRECTAL ABSCESS;  Surgeon: Rolm Bookbinder, MD;  Location: [place];  Service: General;  Laterality: N/A;  . MELANOMA EXCISION Right 2005   arm  . POLYPECTOMY  2004   Laparoscopic  . RECTOCELE REPAIR    . REPLACEMENT TOTAL KNEE  2005 & 2006  . VAGINAL HYSTERECTOMY  1989    Allergies  Allergen Reactions  . Lisinopril Swelling  . Penicillins Rash    Has patient had a PCN reaction causing immediate rash, facial/tongue/throat swelling, SOB or lightheadedness with hypotension: Unknown Has patient had a PCN reaction causing severe rash involving mucus membranes or skin necrosis: Unknown Has patient had a PCN reaction that required hospitalization: Unknown Has patient had a PCN reaction occurring within the last 10 years: Unknown If  all of the above answers are "NO", then may proceed with Cephalosporin use.    Outpatient Encounter Medications as of 01/22/2019  Medication Sig  . acetaminophen (TYLENOL) 500 MG tablet Take 500 mg by mouth 2 (two) times daily.   Marland Kitchen acetaminophen (TYLENOL) 500 MG tablet Take 500 mg by mouth every 8 (eight) hours as needed.  Marland Kitchen aspirin 81 MG EC tablet Take 1 tablet by mouth daily.  . cycloSPORINE (RESTASIS) 0.05 % ophthalmic emulsion Place 1 drop into both eyes 2 (two) times daily.    Marland Kitchen docusate sodium (COLACE) 100 MG capsule Take 1 capsule (100 mg total) by mouth 2 (two) times daily.  Marland Kitchen donepezil (ARICEPT) 10 MG tablet Take 10 mg by mouth at bedtime.  . feeding supplement (BOOST / RESOURCE BREEZE) LIQD Take 1 Container by mouth 2 (two) times daily between meals.  . ferrous sulfate 325 (65 FE) MG tablet Take 1 tablet (325 mg total) by mouth 3 (three) times daily after meals.  . hydroxypropyl methylcellulose / hypromellose (ISOPTO TEARS / GONIOVISC) 2.5 % ophthalmic solution Place 1 drop into both eyes every 2 (two) hours as needed for dry eyes.  Marland Kitchen levothyroxine (SYNTHROID, LEVOTHROID) 88 MCG tablet Take 1 tablet (88 mcg total) by mouth daily before breakfast.  . Metoprolol Tartrate 37.5 MG TABS Take 37.5 mg by mouth 2 (two) times daily.  . mirtazapine (REMERON) 7.5 MG tablet Take 7.5 mg by mouth daily.  Marland Kitchen NUTRITIONAL SUPPLEMENT LIQD Take 1 Magic Cup by mouth at dinner  . Nutritional Supplements (RESOURCE PO) Take 90 mLs by mouth 3 (three) times daily.  Marland Kitchen oxyCODONE (ROXICODONE) 5 MG immediate release tablet Take 0.5 tablets (2.5 mg total) by mouth every 8 (eight) hours as needed.  . OXYGEN Inhale 2 L into the lungs continuous. To keep sat above 90%  . polyethylene glycol powder (GLYCOLAX/MIRALAX) powder Take 17 g by mouth daily as needed for mild constipation.   . Sodium Fluoride (PREVIDENT 5000 BOOSTER PLUS) 1.1 % PSTE Place 1 application onto teeth every morning. Brush on to teeth as directed  .  [DISCONTINUED] zinc oxide 20 % ointment Apply 1 application topically See admin instructions. Apply Zinc Oxide to buttocks topically after every incontinent episode and as needed. May keep at bedside    No facility-administered encounter medications on file as of 01/22/2019.    ROS was provided with assistance of staff Review of Systems  Constitutional: Positive for unexpected weight change. Negative for activity change, appetite change, chills, diaphoresis, fatigue and fever.  HENT: Positive for hearing loss. Negative for congestion and voice change.   Eyes: Negative for visual disturbance.  Respiratory: Negative for cough, shortness of breath and wheezing.   Cardiovascular: Negative for chest pain, palpitations and leg swelling.  Gastrointestinal: Negative for abdominal distention, abdominal pain, constipation, diarrhea and vomiting.  Genitourinary: Negative for difficulty urinating, dysuria and urgency.  Musculoskeletal: Positive for arthralgias, back pain and gait problem.  Skin: Positive for wound.       Right gluteal healing abscess.   Neurological: Negative for dizziness, speech difficulty, weakness and headaches.  Dementia  Psychiatric/Behavioral: Negative for agitation, behavioral problems, hallucinations and sleep disturbance. The patient is not nervous/anxious.     Immunization History  Administered Date(s) Administered  . Influenza Split 10/17/2011, 09/14/2012  . Influenza Whole 10/17/2007  . Influenza, High Dose Seasonal PF 10/11/2013, 10/05/2014  . Influenza,inj,Quad PF,6+ Mos 10/05/2018  . Influenza-Unspecified 09/05/2015, 10/02/2016, 10/06/2017  . PPD Test 01/12/2015  . Pneumococcal Conjugate-13 09/29/2014  . Pneumococcal Polysaccharide-23 12/16/1998  . Td 11/10/2018  . Tdap 04/23/2011  . Zoster 07/04/2006   Pertinent  Health Maintenance Due  Topic Date Due  . OPHTHALMOLOGY EXAM  11/15/2014  . FOOT EXAM  11/30/2014  . URINE MICROALBUMIN  11/30/2014  .  HEMOGLOBIN A1C  10/11/2015  . INFLUENZA VACCINE  Completed  . DEXA SCAN  Completed  . PNA vac Low Risk Adult  Completed   Fall Risk  10/06/2018 10/05/2018 09/02/2017 07/04/2016 01/04/2016  Falls in the past year? No No No No No   Functional Status Survey:    Vitals:   01/22/19 1207  BP: (!) 151/81  Pulse: 63  Resp: 16  Temp: (!) 97 F (36.1 C)  SpO2: 96%  Weight: 117 lb 3.2 oz (53.2 kg)  Height: 4\' 11"  (1.499 m)   Body mass index is 23.67 kg/m. Physical Exam Constitutional:      Appearance: Normal appearance.  HENT:     Head: Normocephalic and atraumatic.     Nose: Nose normal.     Mouth/Throat:     Mouth: Mucous membranes are moist.  Eyes:     Extraocular Movements: Extraocular movements intact.     Pupils: Pupils are equal, round, and reactive to light.  Neck:     Musculoskeletal: Normal range of motion and neck supple.  Cardiovascular:     Rate and Rhythm: Normal rate and regular rhythm.     Heart sounds: Murmur present.  Pulmonary:     Effort: Pulmonary effort is normal.     Breath sounds: No wheezing, rhonchi or rales.  Abdominal:     General: There is no distension.     Tenderness: There is no abdominal tenderness. There is no guarding or rebound.  Musculoskeletal:     Right lower leg: No edema.     Left lower leg: No edema.     Comments: Needs assistance for transfer. W/c for mobility.   Skin:    General: Skin is warm and dry.     Comments: Slow healing right gluteal abscess.   Neurological:     General: No focal deficit present.     Mental Status: She is alert. Mental status is at baseline.     Cranial Nerves: No cranial nerve deficit.     Motor: No weakness.     Coordination: Coordination normal.     Gait: Gait abnormal.     Comments: Oriented to self.   Psychiatric:        Mood and Affect: Mood normal.        Behavior: Behavior normal.     Labs reviewed: Recent Labs    09/24/18 0330 09/25/18 0539  11/11/18 0503 11/12/18 0518  11/13/18 0514 12/03/18  NA 142 141   < > 142 140 139 143  K 4.7 4.6   < > 5.1 4.3 4.2 4.5  CL 109 105   < > 111 113* 107  --   CO2 28 30   < > 21* 21* 24  --   GLUCOSE 101* 89   < > 115* 85  94  --   BUN 19 14   < > 42* 32* 24* 25*  CREATININE 0.87 0.86   < > 1.05* 0.91 0.78 0.9  CALCIUM 8.7* 8.8*   < > 8.8* 8.7* 8.8*  --   MG 1.5* 1.7  --  1.7  --   --   --    < > = values in this interval not displayed.   Recent Labs    09/17/18 0445 09/20/18 0706 09/21/18 0645  AST 43* 17 20  ALT 61* 31 25  ALKPHOS 97 72 64  BILITOT 1.0 1.1 0.6  PROT 5.2* 4.9* 5.2*  ALBUMIN 2.3* 2.2* 2.2*   Recent Labs    11/09/18 1424  11/13/18 0514 11/19/18 1006 12/17/18 1058  WBC 12.5*   < > 9.6 7.3 9.0  NEUTROABS 8.5*  --   --  4.1 6.0  HGB 10.3*   < > 9.9* 9.7* 11.6*  HCT 34.1*   < > 31.0* 30.9* 37.9  MCV 91.2   < > 90.1 92.8 88.3  PLT 283   < > 337 290 278   < > = values in this interval not displayed.   Lab Results  Component Value Date   TSH 1.88 08/11/2018   Lab Results  Component Value Date   HGBA1C 5.6 04/11/2015   Lab Results  Component Value Date   CHOL 147 04/22/2017   HDL 47 04/22/2017   LDLCALC 77 04/22/2017   LDLDIRECT 131.2 11/30/2013   TRIG 125 04/22/2017   CHOLHDL 4 11/30/2013    Significant Diagnostic Results in last 30 days:  No results found.  Assessment/Plan Anemia, iron deficiency Iron 26 08/04/18, Vit B12 >2000, Folate >24  08/11/18. Last Hgb 11.6/12/17/18. will decrease Fe to bid, update CBC Ferritin, Iron.   Vascular dementia without behavioral disturbance ([place]) Continue SNF [facility] for safety and care assistance, continue Donepezil 10mg  qd for memory.   Weight loss Persists, about # 2 Ibs in the past month, continue Mirtazapine 7.5mg  qd, dietary consult.   Essential hypertension Blood pressure is in control, continue Metoprolol 37.5mg  bid.   Hypothyroidism Stable, last TSH 1.88 08/11/18, continue Levothyroxine 57mcg qd.      Family/ staff  Communication: plan of care reviewed with the patient and charge nurse.   Labs/tests ordered: CBC Iron Ferritin one month  Time spend 25 minutes.

## 2019-01-24 ENCOUNTER — Encounter: Payer: Self-pay | Admitting: Nurse Practitioner

## 2019-01-24 NOTE — Assessment & Plan Note (Signed)
Persists, about # 2 Ibs in the past month, continue Mirtazapine 7.5mg  qd, dietary consult.

## 2019-01-24 NOTE — Assessment & Plan Note (Signed)
Iron 26 08/04/18, Vit B12 >2000, Folate >24  08/11/18. Last Hgb 11.6/12/17/18. will decrease Fe to bid, update CBC Ferritin, Iron.

## 2019-01-24 NOTE — Assessment & Plan Note (Signed)
Stable, last TSH 1.88 08/11/18, continue Levothyroxine 61mcg qd.

## 2019-01-24 NOTE — Assessment & Plan Note (Signed)
Blood pressure is in control, continue Metoprolol 37.5mg  bid.

## 2019-01-24 NOTE — Assessment & Plan Note (Signed)
Continue SNF [facility] for safety and care assistance, continue Donepezil 10mg  qd for memory.

## 2019-01-28 ENCOUNTER — Encounter (HOSPITAL_BASED_OUTPATIENT_CLINIC_OR_DEPARTMENT_OTHER): Payer: Medicare Other | Attending: Internal Medicine

## 2019-01-28 DIAGNOSIS — F0390 Unspecified dementia without behavioral disturbance: Secondary | ICD-10-CM | POA: Insufficient documentation

## 2019-01-28 DIAGNOSIS — Z872 Personal history of diseases of the skin and subcutaneous tissue: Secondary | ICD-10-CM | POA: Insufficient documentation

## 2019-01-28 DIAGNOSIS — I120 Hypertensive chronic kidney disease with stage 5 chronic kidney disease or end stage renal disease: Secondary | ICD-10-CM | POA: Diagnosis not present

## 2019-01-28 DIAGNOSIS — N186 End stage renal disease: Secondary | ICD-10-CM | POA: Insufficient documentation

## 2019-01-28 DIAGNOSIS — Z09 Encounter for follow-up examination after completed treatment for conditions other than malignant neoplasm: Secondary | ICD-10-CM | POA: Diagnosis not present

## 2019-02-02 ENCOUNTER — Non-Acute Institutional Stay (SKILLED_NURSING_FACILITY): Payer: Medicare Other | Admitting: Nurse Practitioner

## 2019-02-02 ENCOUNTER — Encounter: Payer: Self-pay | Admitting: Nurse Practitioner

## 2019-02-02 DIAGNOSIS — M25561 Pain in right knee: Secondary | ICD-10-CM | POA: Diagnosis not present

## 2019-02-02 DIAGNOSIS — F0150 Vascular dementia without behavioral disturbance: Secondary | ICD-10-CM | POA: Diagnosis not present

## 2019-02-02 NOTE — Assessment & Plan Note (Signed)
Continue SNF [facility] for safety and care assistance, continue Donepezil 10mg  po qd for memory.

## 2019-02-02 NOTE — Assessment & Plan Note (Signed)
c/o right knee pain, noted swelling today. S/p right knee surgery(11/09/18-11/13/18 hospitalized for right distal periprosthetic femur fracture, s/p ORIF,, on Tylenol 500mg  bid, Oxycodone 5mg  q8h prn. Will obtain X-ray right knee in ap, lateral, oblique views to r/o acute process. Update CBC/diff, CMP.

## 2019-02-02 NOTE — Progress Notes (Signed)
Location:  [facility] Room Number: 26 Place of Service:  SNF [phone]) Provider:  Marlana Latus  NP  Virgie Dad, MD  Patient Care Team: Virgie Dad, MD as PCP - General (Internal Medicine) Milus Banister, MD as Consulting Physician (Gastroenterology) Heath Lark, MD as Consulting Physician (Hematology and Oncology) Lliam Hoh X, NP as Nurse Practitioner (Internal Medicine)  Extended Emergency Contact Information Primary Emergency Contact: [facility] Address: [place], [place] [address] [name] of [place] Phone: [phone] Relation: Son Secondary Emergency Contact: [name],[name] Address: [address] [place] [address] [name] of [place] Phone: [phone] Relation: Daughter  Code Status:  DNR Goals of care: Advanced Directive information Advanced Directives 01/22/2019  Does Patient Have a Medical Advance Directive? Yes  Type of Paramedic of [place];Out of facility DNR (pink MOST or yellow form);Living will  Does patient want to make changes to medical advance directive? No - Patient declined  Copy of [place] in Chart? Yes - validated most recent copy scanned in chart (See row information)  Would patient like information on creating a medical advance directive? -  Pre-existing out of facility DNR order (yellow form or pink MOST form) Yellow form placed in chart (order not valid for inpatient use)     Chief Complaint  Patient presents with  . Acute Visit    (R) knee pain /swelling    HPI:  Pt is a 85 y.o. female seen today for an acute visit for the patient c/o right knee pain, noted swelling today. S/p right knee surgery(11/09/18-11/13/18 hospitalized for right distal periprosthetic femur fracture, s/p ORIF,, on Tylenol 500mg  bid, Oxycodone 5mg  q8h prn. HPI was provided with assistance of staff, on Donepezil 10mg  qd for memory.     Past Medical History:  Diagnosis Date  . Blood transfusion 2006  . Colon polyps   . CVA (cerebral infarction) 2013  . Dementia ([place])   . Diverticulosis of colon   . DJD (degenerative joint disease)   . Hyperlipidemia   . Hypertension   . Hypothyroidism   . Melanoma ([place]) 03/20/15   removed with Moh's surgery  . Myelodysplasia 02/04/2012  . Normochromic normocytic anemia 02/04/2012  . Onychomycosis   . Osteoporosis   . Rectal prolapse   . Shoulder pain   . Unstable gait    Past Surgical History:  Procedure Laterality Date  . APPENDECTOMY    . CARPAL TUNNEL RELEASE Bilateral 2006  . FEMUR IM NAIL Right 11/09/2018   Procedure: INTRAMEDULLARY (IM) RETROGRADE FEMORAL NAILING;  Surgeon: Paralee Cancel, MD;  [facility];  Service: Orthopedics;  Laterality: Right;  . FLEXIBLE SIGMOIDOSCOPY  07/09/2012   Procedure: FLEXIBLE SIGMOIDOSCOPY;  Surgeon: Inda Castle, MD;  [facility];  Service: Endoscopy;  Laterality: N/A;  . INCISION AND DRAINAGE PERIRECTAL ABSCESS N/A 09/21/2018   Procedure: IRRIGATION AND DEBRIDEMENT PERIRECTAL ABSCESS;  Surgeon: Rolm Bookbinder, MD;  Location: [facility];  Service: General;  Laterality: N/A;  . MELANOMA EXCISION Right 2005   arm  . POLYPECTOMY  2004   Laparoscopic  . RECTOCELE REPAIR    . REPLACEMENT TOTAL KNEE  2005 & 2006  . VAGINAL HYSTERECTOMY  1989    Allergies  Allergen Reactions  . Lisinopril Swelling  . Penicillins Rash    Has patient had a PCN reaction causing  immediate rash, facial/tongue/throat swelling, SOB or lightheadedness with hypotension: Unknown Has patient had a PCN reaction causing severe rash involving mucus membranes or skin necrosis: Unknown Has patient had a PCN reaction that required hospitalization: Unknown Has patient had a PCN reaction occurring within the last 10 years: Unknown If all of the above answers are "NO", then may proceed with Cephalosporin use.    Outpatient Encounter Medications as of  02/02/2019  Medication Sig  . acetaminophen (TYLENOL) 500 MG tablet Take 500 mg by mouth 2 (two) times daily.   Marland Kitchen acetaminophen (TYLENOL) 500 MG tablet Take 500 mg by mouth every 8 (eight) hours as needed.  Marland Kitchen aspirin 81 MG EC tablet Take 1 tablet by mouth daily.  . cycloSPORINE (RESTASIS) 0.05 % ophthalmic emulsion Place 1 drop into both eyes 2 (two) times daily.    Marland Kitchen docusate sodium (COLACE) 100 MG capsule Take 1 capsule (100 mg total) by mouth 2 (two) times daily.  Marland Kitchen donepezil (ARICEPT) 10 MG tablet Take 10 mg by mouth at bedtime.  . feeding supplement (BOOST / RESOURCE BREEZE) LIQD Take 1 Container by mouth 2 (two) times daily between meals.  . ferrous sulfate 325 (65 FE) MG tablet Take 1 tablet (325 mg total) by mouth 3 (three) times daily after meals.  . hydroxypropyl methylcellulose / hypromellose (ISOPTO TEARS / GONIOVISC) 2.5 % ophthalmic solution Place 1 drop into both eyes every 2 (two) hours as needed for dry eyes.  Marland Kitchen levothyroxine (SYNTHROID, LEVOTHROID) 88 MCG tablet Take 1 tablet (88 mcg total) by mouth daily before breakfast.  . Metoprolol Tartrate 37.5 MG TABS Take 37.5 mg by mouth 2 (two) times daily.  . mirtazapine (REMERON) 7.5 MG tablet Take 7.5 mg by mouth daily.  Marland Kitchen NUTRITIONAL SUPPLEMENT LIQD Take 1 Magic Cup by mouth at dinner  . Nutritional Supplements (RESOURCE PO) Take 90 mLs by mouth 3 (three) times daily.  Marland Kitchen oxyCODONE (ROXICODONE) 5 MG immediate release tablet Take 0.5 tablets (2.5 mg total) by mouth every 8 (eight) hours as needed.  . OXYGEN Inhale 2 L into the lungs continuous. To keep sat above 90%  . polyethylene glycol powder (GLYCOLAX/MIRALAX) powder Take 17 g by mouth daily as needed for mild constipation.   . Sodium Fluoride (PREVIDENT 5000 BOOSTER PLUS) 1.1 % PSTE Place 1 application onto teeth every morning. Brush on to teeth as directed   No facility-administered encounter medications on file as of 02/02/2019.    ROS was provided with assistance of staff.    Review of Systems  Constitutional: Negative for activity change, appetite change, chills, diaphoresis, fatigue and fever.  HENT: Positive for hearing loss. Negative for congestion and voice change.   Respiratory: Negative for cough, shortness of breath and wheezing.   Gastrointestinal: Negative for abdominal distention, abdominal pain, constipation, diarrhea, nausea and vomiting.  Genitourinary: Negative for difficulty urinating, dysuria and urgency.  Musculoskeletal: Positive for arthralgias and gait problem.       Right knee swelling and pain with ROM  Skin: Negative for color change.       Right gluteal wound is healed.   Neurological: Negative for dizziness, speech difficulty, weakness and headaches.       Dementia  Psychiatric/Behavioral: Negative for agitation, behavioral problems, hallucinations and sleep disturbance. The patient is not nervous/anxious.     Immunization History  Administered Date(s) Administered  . Influenza Split 10/17/2011, 09/14/2012  . Influenza Whole 10/17/2007  . Influenza, High Dose Seasonal PF 10/11/2013, 10/05/2014  . Influenza,inj,Quad PF,6+ Mos  10/05/2018  . Influenza-Unspecified 09/05/2015, 10/02/2016, 10/06/2017  . PPD Test 01/12/2015  . Pneumococcal Conjugate-13 09/29/2014  . Pneumococcal Polysaccharide-23 12/16/1998  . Td 11/10/2018  . Tdap 04/23/2011  . Zoster 07/04/2006   Pertinent  Health Maintenance Due  Topic Date Due  . OPHTHALMOLOGY EXAM  11/15/2014  . FOOT EXAM  11/30/2014  . URINE MICROALBUMIN  11/30/2014  . HEMOGLOBIN A1C  10/11/2015  . INFLUENZA VACCINE  Completed  . DEXA SCAN  Completed  . PNA vac Low Risk Adult  Completed   Fall Risk  10/06/2018 10/05/2018 09/02/2017 07/04/2016 01/04/2016  Falls in the past year? No No No No No   Functional Status Survey:    Vitals:   02/02/19 1129  BP: 130/61  Pulse: 68  Resp: 20  Temp: (!) 97.4 F (36.3 C)  SpO2: 97%  Weight: 117 lb 6.4 oz (53.3 kg)  Height: 4\' 11"  (1.499 m)    Body mass index is 23.71 kg/m. Physical Exam Constitutional:      General: She is not in acute distress.    Appearance: Normal appearance. She is not ill-appearing, toxic-appearing or diaphoretic.  HENT:     Head: Normocephalic and atraumatic.     Nose: Nose normal.     Mouth/Throat:     Mouth: Mucous membranes are moist.  Eyes:     Extraocular Movements: Extraocular movements intact.     Pupils: Pupils are equal, round, and reactive to light.  Neck:     Musculoskeletal: Normal range of motion and neck supple.  Cardiovascular:     Rate and Rhythm: Normal rate and regular rhythm.     Heart sounds: Murmur present.  Pulmonary:     Effort: Pulmonary effort is normal.     Breath sounds: No wheezing, rhonchi or rales.  Abdominal:     Palpations: Abdomen is soft.  Musculoskeletal:     Right lower leg: Edema present.     Left lower leg: No edema.     Comments: The patient was examined in bed, right knee swelling, pain with ROM, no apparent deformity/pallotment/fever.   Skin:    General: Skin is warm and dry.  Neurological:     General: No focal deficit present.     Mental Status: She is alert. Mental status is at baseline.     Cranial Nerves: No cranial nerve deficit.     Motor: No weakness.     Coordination: Coordination normal.     Comments: Oriented to self.   Psychiatric:        Mood and Affect: Mood normal.        Behavior: Behavior normal.     Labs reviewed: Recent Labs    09/24/18 0330 09/25/18 0539  11/11/18 0503 11/12/18 0518 11/13/18 0514 12/03/18  NA 142 141   < > 142 140 139 143  K 4.7 4.6   < > 5.1 4.3 4.2 4.5  CL 109 105   < > 111 113* 107  --   CO2 28 30   < > 21* 21* 24  --   GLUCOSE 101* 89   < > 115* 85 94  --   BUN 19 14   < > 42* 32* 24* 25*  CREATININE 0.87 0.86   < > 1.05* 0.91 0.78 0.9  CALCIUM 8.7* 8.8*   < > 8.8* 8.7* 8.8*  --   MG 1.5* 1.7  --  1.7  --   --   --    < > =  values in this interval not displayed.   Recent Labs     09/17/18 0445 09/20/18 0706 09/21/18 0645  AST 43* 17 20  ALT 61* 31 25  ALKPHOS 97 72 64  BILITOT 1.0 1.1 0.6  PROT 5.2* 4.9* 5.2*  ALBUMIN 2.3* 2.2* 2.2*   Recent Labs    11/09/18 1424  11/13/18 0514 11/19/18 1006 12/17/18 1058  WBC 12.5*   < > 9.6 7.3 9.0  NEUTROABS 8.5*  --   --  4.1 6.0  HGB 10.3*   < > 9.9* 9.7* 11.6*  HCT 34.1*   < > 31.0* 30.9* 37.9  MCV 91.2   < > 90.1 92.8 88.3  PLT 283   < > 337 290 278   < > = values in this interval not displayed.   Lab Results  Component Value Date   TSH 1.88 08/11/2018   Lab Results  Component Value Date   HGBA1C 5.6 04/11/2015   Lab Results  Component Value Date   CHOL 147 04/22/2017   HDL 47 04/22/2017   LDLCALC 77 04/22/2017   LDLDIRECT 131.2 11/30/2013   TRIG 125 04/22/2017   CHOLHDL 4 11/30/2013    Significant Diagnostic Results in last 30 days:  No results found.  Assessment/Plan Right knee pain c/o right knee pain, noted swelling today. S/p right knee surgery(11/09/18-11/13/18 hospitalized for right distal periprosthetic femur fracture, s/p ORIF,, on Tylenol 500mg  bid, Oxycodone 5mg  q8h prn. Will obtain X-ray right knee in ap, lateral, oblique views to r/o acute process. Update CBC/diff, CMP.   Vascular dementia without behavioral disturbance ([name]) Continue SNF [facility] for safety and care assistance, continue Donepezil 10mg  po qd for memory.      Family/ staff Communication: plan of care reviewed with the patient and charge nurse.   Labs/tests ordered:  X-ray right knee, ap/lateral/oblique views. CBC/diff, CMP.   Time spend 25 minutes.

## 2019-02-05 LAB — CBC AND DIFFERENTIAL
HCT: 31 — AB (ref 36–46)
Hemoglobin: 10.6 — AB (ref 12.0–16.0)
Platelets: 594 — AB (ref 150–399)
WBC: 7.7

## 2019-02-05 LAB — HEPATIC FUNCTION PANEL
ALT: 8 (ref 7–35)
AST: 12 — AB (ref 13–35)
Alkaline Phosphatase: 132 — AB (ref 25–125)
Bilirubin, Total: 0.3

## 2019-02-05 LAB — BASIC METABOLIC PANEL
BUN: 22 — AB (ref 4–21)
Creatinine: 1 (ref 0.5–1.1)
Glucose: 75
Potassium: 4.6 (ref 3.4–5.3)
Sodium: 140 (ref 137–147)

## 2019-02-15 NOTE — Progress Notes (Signed)
Spoke to [place], LPN at [facility] requesting her to [facility], Progress notes and recent labs to [place].

## 2019-02-18 ENCOUNTER — Encounter (HOSPITAL_COMMUNITY): Payer: Self-pay | Admitting: *Deleted

## 2019-02-18 NOTE — Progress Notes (Addendum)
Preop instructions for: Brittany [name]                         Date of Birth : 10/04/1924                            Date of Procedure:02/22/2019       Doctor: Dr Paralee Cancel  Time to arrive at [facility]: 200pm  Report to: Admitting  Procedure: Revision Open Reduction Internal Fixation distal femur fracture  Any procedure time changes, MD office will notify you!   Do not eat  past midnight the night before your procedure.(To include any tube feedings-must be discontinued) May have clear liquids from 12 midnite until 1030am am of surgery then nothing by mouth.     CLEAR LIQUID DIET   Foods Allowed                                                                     Foods Excluded  Coffee and tea, regular and decaf                             liquids that you cannot  ( no milk or creamer)  Plain Jell-O in any flavor                                             see through such as: Fruit ices (not with fruit pulp)                                     milk, soups, orange juice  Iced Popsicles                                    All solid food Carbonated beverages, regular and diet                                    Cranberry, grape and apple juices Sports drinks like Gatorade Lightly seasoned clear broth or consume(fat free) Sugar, honey syrup   _____________________________________________________________________     Take these morning medications only with sips of water.(or give through gastrostomy or feeding tube). Eye drops as usual, Metoprolol ( Lopressor), Oxycodone if needed, Synthroid      Facility contact: [place]                  Phone: [place]:  Transportation contact phone#: [place]  Please send day of procedure:current med list and meds last taken that day, confirm nothing by mouth status from what time, Patient Demographic info( to include DNR status, problem list, allergies)   RN contact  name/phone#: [place]  and Fax [employer] card and picture ID Leave all jewelry and other valuables at place where living( no metal or rings to be worn) No contact lens Women-no make-up, no lotions,perfumes,powders   Any questions day of procedure,call Short Stay Center - [phone]     Sent from :[facility]                   [place]                   Fax:[phone]  Sent by :Gillian Shields RN

## 2019-02-18 NOTE — Progress Notes (Signed)
Anesthesia Chart Review   Case:  [id number] Date/Time:  02/22/19 1615   Procedure:  REVISION OPEN REDUCTION INTERNAL FIXATION (ORIF) DISTAL FEMUR FRACTURE (Right ) - 90 mins [place] can start at 3 or 3:30 if time opens up.   Anesthesia type:  Spinal   Pre-op diagnosis:  Right distal femur nonunion/malunion   Location:  [place] [facility] / [facility]   Surgeon:  Paralee Cancel, MD      DISCUSSION: 85 yo never smoker with h/o CHF, mild AS, mild MR, a-fib, HLD, HTN, CKD Stage III, CVA 2013, dementia, hypothyroidism, anemia, O2 dependent due to pulmonary fibrosis (2L continuous), right distal femur nonunion/malunion scheduled for above procedure 02/22/19 with Dr. Paralee Cancel.   Pt is current resident of [facility], palliative care.  S/p femoral nailing with Dr. Paralee Cancel 11/09/18, scheduled for revision. Per last note 02/02/19 pt with current left gluteal ulcer being treated and followed by [facility].   Anesthesia records reviewed, no complications with previous anesthesia 10/30/18.   Pt can proceed with planned procedure after evaluation by anesthesia DOS.  VS: There were no vitals taken for this visit.  PROVIDERS: Virgie Dad, MD is PCP    LABS: Labs DOS (all labs ordered are listed, but only abnormal results are displayed)  Labs Reviewed - No data to display   IMAGES:   EKG: 11/09/18 Rate 55 bpm Sinus rhythm  Nonspecific T abnormalities, anterior leads  CV: Echo 09/22/2018 Study Conclusions  - Left ventricle: The cavity size was normal. There was mild   concentric hypertrophy. Systolic function was normal. The   estimated ejection fraction was in the range of 55% to 60%. Wall   motion was normal; there were no regional wall motion   abnormalities. Doppler parameters are consistent with abnormal   left ventricular relaxation (grade 1 diastolic dysfunction). - Aortic valve: Moderate focal calcification involving the   noncoronary cusp. Transvalvular velocity was increased,  due to   stenosis. There was mild stenosis. Valve area (VTI): 1.44 cm^2.   Valve area (Vmax): 1.4 cm^2. Valve area (Vmean): 1.35 cm^2. - Mitral valve: Calcified annulus. There was mild regurgitation. - Left atrium: The atrium was mildly dilated. - Pulmonary arteries: Systolic pressure was mildly increased. PA   peak pressure: 31 mm Hg (S). - Pericardium, extracardiac: A trivial pericardial effusion was   identified. Past Medical History:  Diagnosis Date  . Blood transfusion 2006  . Colon polyps   . CVA (cerebral infarction) 2013  . Dementia ([place])    vascular dementia   . Diverticulosis of colon   . DJD (degenerative joint disease)   . Hyperlipidemia   . Hypertension   . Hypothyroidism   . Melanoma ([place]) 03/20/15   removed with Moh's surgery  . Myelodysplasia 02/04/2012  . Normochromic normocytic anemia 02/04/2012  . Onychomycosis   . Osteoporosis   . Oxygen dependent    2l/[place] continuous   . Rectal prolapse   . Shoulder pain   . Unstable gait     Past Surgical History:  Procedure Laterality Date  . APPENDECTOMY    . CARPAL TUNNEL RELEASE Bilateral 2006  . FEMUR IM NAIL Right 11/09/2018   Procedure: INTRAMEDULLARY (IM) RETROGRADE FEMORAL NAILING;  Surgeon: Paralee Cancel, MD;  [facility];  Service: Orthopedics;  Laterality: Right;  . FLEXIBLE SIGMOIDOSCOPY  07/09/2012   Procedure: FLEXIBLE SIGMOIDOSCOPY;  Surgeon: Inda Castle, MD;  [facility];  Service: Endoscopy;  Laterality: N/A;  . INCISION AND  DRAINAGE PERIRECTAL ABSCESS N/A 09/21/2018   Procedure: IRRIGATION AND DEBRIDEMENT PERIRECTAL ABSCESS;  Surgeon: Rolm Bookbinder, MD;  Location: [place];  Service: General;  Laterality: N/A;  . MELANOMA EXCISION Right 2005   arm  . POLYPECTOMY  2004   Laparoscopic  . RECTOCELE REPAIR    . REPLACEMENT TOTAL KNEE  2005 & 2006  . VAGINAL HYSTERECTOMY  1989    MEDICATIONS: No current facility-administered medications for this encounter.    Marland Kitchen acetaminophen  (TYLENOL) 500 MG tablet  . acetaminophen (TYLENOL) 500 MG tablet  . aspirin 81 MG EC tablet  . cycloSPORINE (RESTASIS) 0.05 % ophthalmic emulsion  . docusate sodium (COLACE) 100 MG capsule  . donepezil (ARICEPT) 10 MG tablet  . feeding supplement (BOOST / RESOURCE BREEZE) LIQD  . ferrous sulfate 325 (65 FE) MG tablet  . hydroxypropyl methylcellulose / hypromellose (ISOPTO TEARS / GONIOVISC) 2.5 % ophthalmic solution  . levothyroxine (SYNTHROID, LEVOTHROID) 88 MCG tablet  . Metoprolol Tartrate 37.5 MG TABS  . mirtazapine (REMERON) 7.5 MG tablet  . NUTRITIONAL SUPPLEMENT LIQD  . Nutritional Supplements (RESOURCE PO)  . oxyCODONE (ROXICODONE) 5 MG immediate release tablet  . OXYGEN  . polyethylene glycol powder (GLYCOLAX/MIRALAX) powder  . Sodium Fluoride (PREVIDENT 5000 BOOSTER PLUS) 1.1 % PSTE   Maia Plan [facility] [phone] 02/18/19 1:51 PM

## 2019-02-18 NOTE — Anesthesia Preprocedure Evaluation (Addendum)
Anesthesia Evaluation  Patient identified by MRN, date of birth, ID band Patient awake    Reviewed: Allergy & Precautions, NPO status , Patient's Chart, lab work & pertinent test results  Airway Mallampati: II  TM Distance: >3 FB Neck ROM: Full    Dental no notable dental hx.    Pulmonary COPD,  oxygen dependent,    Pulmonary exam normal breath sounds clear to auscultation       Cardiovascular hypertension, negative cardio ROS Normal cardiovascular exam Rhythm:Regular Rate:Normal     Neuro/Psych Dementia CVA, Residual Symptoms negative psych ROS   GI/Hepatic negative GI ROS, Neg liver ROS,   Endo/Other  negative endocrine ROS  Renal/GU negative Renal ROS  negative genitourinary   Musculoskeletal negative musculoskeletal ROS (+)   Abdominal   Peds negative pediatric ROS (+)  Hematology negative hematology ROS (+)   Anesthesia Other Findings   Reproductive/Obstetrics negative OB ROS                            Anesthesia Physical Anesthesia Plan  ASA: III  Anesthesia Plan: Spinal   Post-op Pain Management:    Induction:   PONV Risk Score and Plan: 2 and Treatment may vary due to age or medical condition  Airway Management Planned: Simple Face Mask  Additional Equipment:   Intra-op Plan:   Post-operative Plan:   Informed Consent: I have reviewed the patients History and Physical, chart, labs and discussed the procedure including the risks, benefits and alternatives for the proposed anesthesia with the patient or authorized representative who has indicated his/her understanding and acceptance.     Dental advisory given  Plan Discussed with: CRNA  Anesthesia Plan Comments: (See PAT note 02/18/19, Konrad Felix, PA-C)       Anesthesia Quick Evaluation

## 2019-02-22 ENCOUNTER — Telehealth (HOSPITAL_COMMUNITY): Payer: Self-pay | Admitting: *Deleted

## 2019-02-22 ENCOUNTER — Inpatient Hospital Stay (HOSPITAL_COMMUNITY): Payer: Medicare Other | Admitting: Physician Assistant

## 2019-02-22 ENCOUNTER — Encounter (HOSPITAL_COMMUNITY): Payer: Self-pay | Admitting: *Deleted

## 2019-02-22 ENCOUNTER — Inpatient Hospital Stay (HOSPITAL_COMMUNITY)
Admission: RE | Admit: 2019-02-22 | Discharge: 2019-02-24 | DRG: 482 | Disposition: A | Discharge status: Home / Self Care | Payer: Medicare Other | Attending: Orthopedic Surgery | Admitting: Orthopedic Surgery

## 2019-02-22 ENCOUNTER — Inpatient Hospital Stay (HOSPITAL_COMMUNITY): Payer: Medicare Other

## 2019-02-22 ENCOUNTER — Encounter (HOSPITAL_COMMUNITY)
Admission: RE | Disposition: A | Discharge status: Home / Self Care | Payer: Self-pay | Source: Home / Self Care | Attending: Orthopedic Surgery

## 2019-02-22 DIAGNOSIS — S7290XA Unspecified fracture of unspecified femur, initial encounter for closed fracture: Secondary | ICD-10-CM

## 2019-02-22 DIAGNOSIS — Z79899 Other long term (current) drug therapy: Secondary | ICD-10-CM

## 2019-02-22 DIAGNOSIS — Z9889 Other specified postprocedural states: Secondary | ICD-10-CM

## 2019-02-22 DIAGNOSIS — Z79890 Hormone replacement therapy (postmenopausal): Secondary | ICD-10-CM | POA: Diagnosis not present

## 2019-02-22 DIAGNOSIS — Z85820 Personal history of malignant melanoma of skin: Secondary | ICD-10-CM | POA: Diagnosis not present

## 2019-02-22 DIAGNOSIS — I129 Hypertensive chronic kidney disease with stage 1 through stage 4 chronic kidney disease, or unspecified chronic kidney disease: Secondary | ICD-10-CM | POA: Diagnosis present

## 2019-02-22 DIAGNOSIS — Z9981 Dependence on supplemental oxygen: Secondary | ICD-10-CM

## 2019-02-22 DIAGNOSIS — M9711XA Periprosthetic fracture around internal prosthetic right knee joint, initial encounter: Principal | ICD-10-CM | POA: Diagnosis present

## 2019-02-22 DIAGNOSIS — M810 Age-related osteoporosis without current pathological fracture: Secondary | ICD-10-CM | POA: Diagnosis present

## 2019-02-22 DIAGNOSIS — Z8673 Personal history of transient ischemic attack (TIA), and cerebral infarction without residual deficits: Secondary | ICD-10-CM

## 2019-02-22 DIAGNOSIS — N183 Chronic kidney disease, stage 3 (moderate): Secondary | ICD-10-CM | POA: Diagnosis present

## 2019-02-22 DIAGNOSIS — E875 Hyperkalemia: Secondary | ICD-10-CM | POA: Diagnosis present

## 2019-02-22 DIAGNOSIS — Z7982 Long term (current) use of aspirin: Secondary | ICD-10-CM

## 2019-02-22 DIAGNOSIS — E785 Hyperlipidemia, unspecified: Secondary | ICD-10-CM | POA: Diagnosis present

## 2019-02-22 DIAGNOSIS — E039 Hypothyroidism, unspecified: Secondary | ICD-10-CM | POA: Diagnosis present

## 2019-02-22 DIAGNOSIS — Z8781 Personal history of (healed) traumatic fracture: Secondary | ICD-10-CM

## 2019-02-22 HISTORY — DX: Dependence on supplemental oxygen: Z99.81

## 2019-02-22 HISTORY — PX: ORIF FEMUR FRACTURE: SHX2119

## 2019-02-22 LAB — CBC
HCT: 34.7 % — ABNORMAL LOW (ref 36.0–46.0)
Hemoglobin: 10.3 g/dL — ABNORMAL LOW (ref 12.0–15.0)
MCH: 26.6 pg (ref 26.0–34.0)
MCHC: 29.7 g/dL — ABNORMAL LOW (ref 30.0–36.0)
MCV: 89.7 fL (ref 80.0–100.0)
PLATELETS: 281 10*3/uL (ref 150–400)
RBC: 3.87 MIL/uL (ref 3.87–5.11)
RDW: 17.6 % — ABNORMAL HIGH (ref 11.5–15.5)
WBC: 6 10*3/uL (ref 4.0–10.5)
nRBC: 0 % (ref 0.0–0.2)

## 2019-02-22 LAB — SURGICAL PCR SCREEN
MRSA, PCR: POSITIVE — AB
STAPHYLOCOCCUS AUREUS: POSITIVE — AB

## 2019-02-22 LAB — TYPE AND SCREEN
ABO/RH(D): O POS
Antibody Screen: NEGATIVE

## 2019-02-22 LAB — BASIC METABOLIC PANEL
Anion gap: 8 (ref 5–15)
BUN: 36 mg/dL — ABNORMAL HIGH (ref 8–23)
CHLORIDE: 110 mmol/L (ref 98–111)
CO2: 23 mmol/L (ref 22–32)
Calcium: 9.5 mg/dL (ref 8.9–10.3)
Creatinine, Ser: 1.24 mg/dL — ABNORMAL HIGH (ref 0.44–1.00)
GFR calc Af Amer: 44 mL/min — ABNORMAL LOW (ref >60)
GFR calc non Af Amer: 38 mL/min — ABNORMAL LOW (ref >60)
Glucose, Bld: 100 mg/dL — ABNORMAL HIGH (ref 70–99)
Potassium: 4.4 mmol/L (ref 3.5–5.1)
Sodium: 141 mmol/L (ref 135–145)

## 2019-02-22 SURGERY — OPEN REDUCTION INTERNAL FIXATION (ORIF) DISTAL FEMUR FRACTURE
Anesthesia: Spinal | Site: Leg Upper | Laterality: Right

## 2019-02-22 MED ORDER — DOCUSATE SODIUM 100 MG PO CAPS
100.0000 mg | ORAL_CAPSULE | Freq: Two times a day (BID) | ORAL | Status: DC
  Administered 2019-02-22 – 2019-02-24 (×4): 100 mg via ORAL
  Filled 2019-02-22 (×4): qty 1

## 2019-02-22 MED ORDER — PHENYLEPHRINE 40 MCG/ML (10ML) SYRINGE FOR IV PUSH (FOR BLOOD PRESSURE SUPPORT)
PREFILLED_SYRINGE | INTRAVENOUS | Status: AC
  Filled 2019-02-22: qty 10

## 2019-02-22 MED ORDER — DEXAMETHASONE SODIUM PHOSPHATE 10 MG/ML IJ SOLN
INTRAMUSCULAR | Status: DC | PRN
  Administered 2019-02-22: 5 mg via INTRAVENOUS

## 2019-02-22 MED ORDER — HYDROCODONE-ACETAMINOPHEN 7.5-325 MG PO TABS: 1.0000 | ORAL_TABLET | ORAL | Status: DC | PRN

## 2019-02-22 MED ORDER — FERROUS SULFATE 325 (65 FE) MG PO TABS
325.0000 mg | ORAL_TABLET | Freq: Three times a day (TID) | ORAL | Status: DC
  Administered 2019-02-23 – 2019-02-24 (×4): 325 mg via ORAL
  Filled 2019-02-22 (×4): qty 1

## 2019-02-22 MED ORDER — FENTANYL CITRATE (PF) 100 MCG/2ML IJ SOLN
INTRAMUSCULAR | Status: AC
  Administered 2019-02-22: 25 ug via INTRAVENOUS
  Filled 2019-02-22: qty 2

## 2019-02-22 MED ORDER — METOCLOPRAMIDE HCL 5 MG/ML IJ SOLN: 5.0000 mg | Freq: Three times a day (TID) | INTRAMUSCULAR | Status: DC | PRN

## 2019-02-22 MED ORDER — METOCLOPRAMIDE HCL 5 MG PO TABS: 5.0000 mg | ORAL_TABLET | Freq: Three times a day (TID) | ORAL | Status: DC | PRN

## 2019-02-22 MED ORDER — CHLORHEXIDINE GLUCONATE 4 % EX LIQD: 60.0000 mL | Freq: Once | CUTANEOUS | Status: DC

## 2019-02-22 MED ORDER — PHENOL 1.4 % MT LIQD
1.0000 | OROMUCOSAL | Status: DC | PRN
  Filled 2019-02-22: qty 177

## 2019-02-22 MED ORDER — HYDROCODONE-ACETAMINOPHEN 5-325 MG PO TABS
1.0000 | ORAL_TABLET | ORAL | Status: DC | PRN
  Administered 2019-02-22 – 2019-02-23 (×2): 1 via ORAL
  Filled 2019-02-22 (×2): qty 1

## 2019-02-22 MED ORDER — PHENYLEPHRINE HCL 10 MG/ML IJ SOLN
INTRAMUSCULAR | Status: AC
  Filled 2019-02-22: qty 1

## 2019-02-22 MED ORDER — ASPIRIN EC 325 MG PO TBEC
325.0000 mg | DELAYED_RELEASE_TABLET | Freq: Two times a day (BID) | ORAL | Status: DC
  Administered 2019-02-23 – 2019-02-24 (×3): 325 mg via ORAL
  Filled 2019-02-22 (×3): qty 1

## 2019-02-22 MED ORDER — ONDANSETRON HCL 4 MG/2ML IJ SOLN: 4.0000 mg | Freq: Four times a day (QID) | INTRAMUSCULAR | Status: DC | PRN

## 2019-02-22 MED ORDER — KETAMINE HCL 10 MG/ML IJ SOLN
INTRAMUSCULAR | Status: DC | PRN
  Administered 2019-02-22: 30 mg via INTRAVENOUS

## 2019-02-22 MED ORDER — FENTANYL CITRATE (PF) 100 MCG/2ML IJ SOLN
INTRAMUSCULAR | Status: DC | PRN
  Administered 2019-02-22 (×2): 25 ug via INTRAVENOUS

## 2019-02-22 MED ORDER — TRANEXAMIC ACID-NACL 1000-0.7 MG/100ML-% IV SOLN
1000.0000 mg | INTRAVENOUS | Status: AC
  Administered 2019-02-22: 1000 mg via INTRAVENOUS
  Filled 2019-02-22: qty 100

## 2019-02-22 MED ORDER — SODIUM CHLORIDE 0.9 % IV SOLN
INTRAVENOUS | Status: DC | PRN
  Administered 2019-02-22: 18:00:00 via INTRAVENOUS

## 2019-02-22 MED ORDER — CEFAZOLIN SODIUM-DEXTROSE 2-4 GM/100ML-% IV SOLN: 2.0000 g | Freq: Four times a day (QID) | INTRAVENOUS | Status: DC

## 2019-02-22 MED ORDER — SODIUM CHLORIDE 0.9 % IV SOLN
INTRAVENOUS | Status: DC | PRN
  Administered 2019-02-22: 50 ug/min via INTRAVENOUS

## 2019-02-22 MED ORDER — EPHEDRINE SULFATE-NACL 50-0.9 MG/10ML-% IV SOSY
PREFILLED_SYRINGE | INTRAVENOUS | Status: DC | PRN
  Administered 2019-02-22: 5 mg via INTRAVENOUS

## 2019-02-22 MED ORDER — PROPOFOL 10 MG/ML IV BOLUS
INTRAVENOUS | Status: DC | PRN
  Administered 2019-02-22: 20 mg via INTRAVENOUS

## 2019-02-22 MED ORDER — FENTANYL CITRATE (PF) 100 MCG/2ML IJ SOLN
25.0000 ug | INTRAMUSCULAR | Status: DC | PRN
  Administered 2019-02-22 (×2): 25 ug via INTRAVENOUS

## 2019-02-22 MED ORDER — FENTANYL CITRATE (PF) 100 MCG/2ML IJ SOLN
INTRAMUSCULAR | Status: AC
  Filled 2019-02-22: qty 2

## 2019-02-22 MED ORDER — ALBUMIN HUMAN 5 % IV SOLN
INTRAVENOUS | Status: DC | PRN
  Administered 2019-02-22: 17:00:00 via INTRAVENOUS

## 2019-02-22 MED ORDER — ONDANSETRON HCL 4 MG/2ML IJ SOLN
INTRAMUSCULAR | Status: AC
  Filled 2019-02-22: qty 2

## 2019-02-22 MED ORDER — 0.9 % SODIUM CHLORIDE (POUR BTL) OPTIME
TOPICAL | Status: DC | PRN
  Administered 2019-02-22: 1000 mL

## 2019-02-22 MED ORDER — ACETAMINOPHEN 325 MG PO TABS
325.0000 mg | ORAL_TABLET | Freq: Four times a day (QID) | ORAL | Status: DC | PRN
  Administered 2019-02-23: 325 mg via ORAL
  Filled 2019-02-22: qty 1

## 2019-02-22 MED ORDER — CEFAZOLIN SODIUM-DEXTROSE 2-4 GM/100ML-% IV SOLN
2.0000 g | INTRAVENOUS | Status: AC
  Administered 2019-02-22: 2 g via INTRAVENOUS
  Filled 2019-02-22: qty 100

## 2019-02-22 MED ORDER — EPHEDRINE 5 MG/ML INJ
INTRAVENOUS | Status: AC
  Filled 2019-02-22: qty 10

## 2019-02-22 MED ORDER — DEXAMETHASONE SODIUM PHOSPHATE 10 MG/ML IJ SOLN
INTRAMUSCULAR | Status: AC
  Filled 2019-02-22: qty 1

## 2019-02-22 MED ORDER — PHENYLEPHRINE 40 MCG/ML (10ML) SYRINGE FOR IV PUSH (FOR BLOOD PRESSURE SUPPORT)
PREFILLED_SYRINGE | INTRAVENOUS | Status: DC | PRN
  Administered 2019-02-22: 120 ug via INTRAVENOUS
  Administered 2019-02-22: 80 ug via INTRAVENOUS
  Administered 2019-02-22: 40 ug via INTRAVENOUS
  Administered 2019-02-22: 80 ug via INTRAVENOUS
  Administered 2019-02-22: 120 ug via INTRAVENOUS
  Administered 2019-02-22: 80 ug via INTRAVENOUS
  Administered 2019-02-22: 120 ug via INTRAVENOUS
  Administered 2019-02-22: 80 ug via INTRAVENOUS
  Administered 2019-02-22: 120 ug via INTRAVENOUS
  Administered 2019-02-22: 40 ug via INTRAVENOUS
  Administered 2019-02-22: 120 ug via INTRAVENOUS
  Administered 2019-02-22: 80 ug via INTRAVENOUS
  Administered 2019-02-22: 40 ug via INTRAVENOUS
  Administered 2019-02-22 (×4): 80 ug via INTRAVENOUS
  Administered 2019-02-22: 40 ug via INTRAVENOUS
  Administered 2019-02-22 (×3): 80 ug via INTRAVENOUS
  Administered 2019-02-22: 40 ug via INTRAVENOUS

## 2019-02-22 MED ORDER — MORPHINE SULFATE (PF) 2 MG/ML IV SOLN: 0.5000 mg | INTRAVENOUS | Status: DC | PRN

## 2019-02-22 MED ORDER — MEPERIDINE HCL 50 MG/ML IJ SOLN: 6.2500 mg | INTRAMUSCULAR | Status: DC | PRN

## 2019-02-22 MED ORDER — MENTHOL 3 MG MT LOZG: 1.0000 | LOZENGE | OROMUCOSAL | Status: DC | PRN

## 2019-02-22 MED ORDER — KETAMINE HCL 10 MG/ML IJ SOLN
INTRAMUSCULAR | Status: AC
  Filled 2019-02-22: qty 1

## 2019-02-22 MED ORDER — METOCLOPRAMIDE HCL 5 MG/ML IJ SOLN: 10.0000 mg | Freq: Once | INTRAMUSCULAR | Status: DC | PRN

## 2019-02-22 MED ORDER — PROPOFOL 500 MG/50ML IV EMUL
INTRAVENOUS | Status: DC | PRN
  Administered 2019-02-22: 25 ug/kg/min via INTRAVENOUS

## 2019-02-22 MED ORDER — ONDANSETRON HCL 4 MG PO TABS: 4.0000 mg | ORAL_TABLET | Freq: Four times a day (QID) | ORAL | Status: DC | PRN

## 2019-02-22 MED ORDER — LACTATED RINGERS IV SOLN
INTRAVENOUS | Status: DC
  Administered 2019-02-22 – 2019-02-23 (×3): via INTRAVENOUS

## 2019-02-22 MED ORDER — DEXAMETHASONE SODIUM PHOSPHATE 10 MG/ML IJ SOLN: 10.0000 mg | Freq: Once | INTRAMUSCULAR | Status: DC

## 2019-02-22 MED ORDER — BUPIVACAINE IN DEXTROSE 0.75-8.25 % IT SOLN
INTRATHECAL | Status: DC | PRN
  Administered 2019-02-22: 1.4 mL via INTRATHECAL

## 2019-02-22 MED ORDER — CELECOXIB 200 MG PO CAPS
200.0000 mg | ORAL_CAPSULE | Freq: Two times a day (BID) | ORAL | Status: DC
  Administered 2019-02-22: 200 mg via ORAL
  Filled 2019-02-22: qty 1

## 2019-02-22 MED ORDER — VANCOMYCIN HCL IN DEXTROSE 1-5 GM/200ML-% IV SOLN
1000.0000 mg | INTRAVENOUS | Status: AC
  Administered 2019-02-22: 1000 mg via INTRAVENOUS
  Filled 2019-02-22: qty 200

## 2019-02-22 MED ORDER — CEFAZOLIN SODIUM-DEXTROSE 2-4 GM/100ML-% IV SOLN
2.0000 g | Freq: Two times a day (BID) | INTRAVENOUS | Status: AC
  Administered 2019-02-23: 2 g via INTRAVENOUS
  Filled 2019-02-22: qty 100

## 2019-02-22 SURGICAL SUPPLY — 61 items
BAG ZIPLOCK 12X15 (MISCELLANEOUS) ×2 IMPLANT
BANDAGE ACE 6X5 VEL STRL LF (GAUZE/BANDAGES/DRESSINGS) ×4 IMPLANT
BIT DRILL 4.3 (BIT) ×2
BIT DRILL 4.3X300MM (BIT) ×1 IMPLANT
BIT DRILL QC 3.3X195 (BIT) ×2 IMPLANT
BLADE SAW SGTL 11.0X1.19X90.0M (BLADE) IMPLANT
BNDG COHESIVE 4X5 TAN STRL (GAUZE/BANDAGES/DRESSINGS) ×2 IMPLANT
BNDG GAUZE ELAST 4 BULKY (GAUZE/BANDAGES/DRESSINGS) ×2 IMPLANT
CAP LOCK NCB (Cap) ×8 IMPLANT
CHLORAPREP W/TINT 26ML (MISCELLANEOUS) ×2 IMPLANT
COVER SURGICAL LIGHT HANDLE (MISCELLANEOUS) ×2 IMPLANT
COVER WAND RF STERILE (DRAPES) IMPLANT
DRAPE C-ARM 42X120 X-RAY (DRAPES) ×2 IMPLANT
DRAPE C-ARMOR (DRAPES) ×2 IMPLANT
DRAPE ORTHO SPLIT 77X108 STRL (DRAPES) ×1
DRAPE SURG ORHT 6 SPLT 77X108 (DRAPES) ×1 IMPLANT
DRAPE U-SHAPE 47X51 STRL (DRAPES) ×2 IMPLANT
DRSG EMULSION OIL 3X16 NADH (GAUZE/BANDAGES/DRESSINGS) ×2 IMPLANT
DRSG MEPILEX BORDER 4X12 (GAUZE/BANDAGES/DRESSINGS) ×2 IMPLANT
DRSG MEPILEX BORDER 4X4 (GAUZE/BANDAGES/DRESSINGS) ×2 IMPLANT
DRSG PAD ABDOMINAL 8X10 ST (GAUZE/BANDAGES/DRESSINGS) ×4 IMPLANT
DURAPREP 26ML APPLICATOR (WOUND CARE) ×2 IMPLANT
ELECT REM PT RETURN 15FT ADLT (MISCELLANEOUS) ×2 IMPLANT
GAUZE SPONGE 4X4 12PLY STRL (GAUZE/BANDAGES/DRESSINGS) ×2 IMPLANT
GLOVE BIOGEL M 7.0 STRL (GLOVE) IMPLANT
GLOVE BIOGEL PI IND STRL 7.5 (GLOVE) ×1 IMPLANT
GLOVE BIOGEL PI IND STRL 8.5 (GLOVE) ×1 IMPLANT
GLOVE BIOGEL PI INDICATOR 7.5 (GLOVE) ×1
GLOVE BIOGEL PI INDICATOR 8.5 (GLOVE) ×1
GLOVE ECLIPSE 8.0 STRL XLNG CF (GLOVE) ×2 IMPLANT
GLOVE ORTHO TXT STRL SZ7.5 (GLOVE) ×4 IMPLANT
GLOVE SURG ORTHO 8.0 STRL STRW (GLOVE) ×2 IMPLANT
GOWN STRL REUS W/TWL LRG LVL3 (GOWN DISPOSABLE) ×2 IMPLANT
GOWN STRL REUS W/TWL XL LVL3 (GOWN DISPOSABLE) ×4 IMPLANT
K-WIRE 2.0 (WIRE) ×2
K-WIRE FXSTD 280X2XNS SS (WIRE) ×2
KIT BASIN OR (CUSTOM PROCEDURE TRAY) ×2 IMPLANT
KIT TURNOVER KIT A (KITS) IMPLANT
KWIRE FXSTD 280X2XNS SS (WIRE) ×2 IMPLANT
MANIFOLD NEPTUNE II (INSTRUMENTS) ×2 IMPLANT
NS IRRIG 1000ML POUR BTL (IV SOLUTION) ×2 IMPLANT
PACK TOTAL JOINT (CUSTOM PROCEDURE TRAY) ×2 IMPLANT
PLATE DISTAL FEMUR 15H 317M RT (Plate) ×2 IMPLANT
PROTECTOR NERVE ULNAR (MISCELLANEOUS) ×2 IMPLANT
SCREW 5.0 32MM (Screw) ×2 IMPLANT
SCREW 5.0 80MM (Screw) ×2 IMPLANT
SCREW CORT NCB SELFTAP 5.0X50 (Screw) ×2 IMPLANT
SCREW CORTICAL NCB 5.0X40 (Screw) ×2 IMPLANT
SCREW CORTICAL NCB 5.0X65 (Screw) ×2 IMPLANT
SCREW NCB 5.0X30MM (Screw) ×6 IMPLANT
SCREW NCB 5.0X55MM (Screw) ×4 IMPLANT
STAPLER VISISTAT 35W (STAPLE) ×2 IMPLANT
STRIP CLOSURE SKIN 1/2X4 (GAUZE/BANDAGES/DRESSINGS) ×2 IMPLANT
SUT MNCRL AB 4-0 PS2 18 (SUTURE) ×2 IMPLANT
SUT STRATAFIX 1PDS 45CM VIOLET (SUTURE) ×2 IMPLANT
SUT VIC AB 1 CT1 36 (SUTURE) ×4 IMPLANT
SUT VIC AB 2-0 CT1 27 (SUTURE) ×2
SUT VIC AB 2-0 CT1 TAPERPNT 27 (SUTURE) ×2 IMPLANT
TOWEL OR 17X26 10 PK STRL BLUE (TOWEL DISPOSABLE) ×4 IMPLANT
TRAY FOLEY CATH 14FR (SET/KITS/TRAYS/PACK) ×2 IMPLANT
WATER STERILE IRR 1000ML POUR (IV SOLUTION) ×2 IMPLANT

## 2019-02-22 NOTE — Anesthesia Postprocedure Evaluation (Signed)
Anesthesia Post Note  Patient: Brittany [name]  Procedure(s) Performed: REVISION OPEN REDUCTION INTERNAL FIXATION (ORIF) DISTAL FEMUR FRACTURE (Right Leg Upper)     Patient location during evaluation: PACU Anesthesia Type: Spinal Level of consciousness: confused and awake Pain management: pain level controlled Vital Signs Assessment: post-procedure vital signs reviewed and stable Respiratory status: spontaneous breathing and respiratory function stable Cardiovascular status: blood pressure returned to baseline and stable Postop Assessment: no headache, no backache, spinal receding and no apparent nausea or vomiting Anesthetic complications: no    Last Vitals:  Vitals:   02/22/19 2005 02/22/19 2111  BP: (!) 165/63 (!) 156/96  Pulse: 60 70  Resp: 16   Temp: (!) 36.4 C 36.5 C  SpO2: 99% 100%    Last Pain:  Vitals:   02/22/19 2111  TempSrc: Oral  PainSc:                  Montez Hageman

## 2019-02-22 NOTE — Progress Notes (Signed)
PHARMACY NOTE:  ANTIMICROBIAL RENAL DOSAGE ADJUSTMENT  Current antimicrobial regimen includes a mismatch between antimicrobial dosage and estimated renal function.  As per policy approved by the Pharmacy & Therapeutics and Medical Executive Committees, the antimicrobial dosage will be adjusted accordingly.  Current antimicrobial dosage:  Cefazolin 2gm IV q6h x 2 doses post op  Indication: surgical prophylaxis  Renal Function:  Estimated Creatinine Clearance: 19.7 mL/min (A) (by C-G formula based on SCr of 1.24 mg/dL (H)).     Antimicrobial dosage has been changed to:  Cefazolin 2gm IV q12h x 1 dose to cover the post-op prophylaxis time period   Thank you for allowing pharmacy to be a part of this patient's care.  Everette Rank, [facility] 02/22/2019 8:31 PM

## 2019-02-22 NOTE — Anesthesia Procedure Notes (Signed)
Date/Time: 02/22/2019 4:09 PM Performed by: Sharlette Dense, CRNA Oxygen Delivery Method: Simple face mask

## 2019-02-22 NOTE — Transfer of Care (Signed)
Immediate Anesthesia Transfer of Care Note  Patient: Brittany [name]  Procedure(s) Performed: REVISION OPEN REDUCTION INTERNAL FIXATION (ORIF) DISTAL FEMUR FRACTURE (Right Leg Upper)  Patient Location: PACU  Anesthesia Type:General  Level of Consciousness: sedated  Airway & Oxygen Therapy: Patient Spontanous Breathing and Patient connected to face mask oxygen  Post-op Assessment: Report given to RN and Post -op Vital signs reviewed and stable  Post vital signs: Reviewed and stable  Last Vitals:  Vitals Value Taken Time  BP 116/69 02/22/2019  6:30 PM  Temp    Pulse 59 02/22/2019  6:34 PM  Resp 13 02/22/2019  6:34 PM  SpO2 100 % 02/22/2019  6:34 PM  Vitals shown include unvalidated device data.  Last Pain:  Vitals:   02/22/19 1435  TempSrc: Oral         Complications: intraop hypotension Rx Neo and Albumin EBL 150-- surgeon aware

## 2019-02-22 NOTE — Anesthesia Procedure Notes (Signed)
Spinal  Patient location during procedure: OR Staffing Anesthesiologist: Montez Hageman, MD Performed: anesthesiologist  Preanesthetic Checklist Completed: patient identified, site marked, surgical consent, pre-op evaluation, timeout performed, IV checked, risks and benefits discussed and monitors and equipment checked Spinal Block Patient position: sitting Prep: Betadine Patient monitoring: heart rate, continuous pulse ox and blood pressure Approach: right paramedian Location: L3-4 Injection technique: single-shot Needle Needle type: Quincke  Needle gauge: 22 G Needle length: 9 cm Additional Notes Expiration date of kit checked and confirmed. Patient tolerated procedure well, without complications.

## 2019-02-22 NOTE — H&P (Signed)
Brittany [name] is an 85 y.o. female.    Chief Complaint: Right distal femur malunion   HPI: Pt is a 85 y.o. female with history of right total knee replacement.  Approximately 3 months ago she sustained a right distal femur periprosthetic fracture.  It was initially treated nonoperatively but then she presented shortly thereafter with a an open fracture.  I took her to the operating room and performed an open reduction internal fixation with cables based on her limited mobility and function.  Initially she presented office with an anatomically reduced fracture however at her most recent visit approximately a week and a half ago she was noted to have a malunion with significant flexion through her fracture site. I had a lengthy discussion with her family and we have decided to take her back to the operating room to try to restore the anatomic orientation of her femur and fix this with a lateral distal femoral plate.  There has been no falls noted by the family or the facility.   PCP:  Virgie Dad, MD  D/C Plans: To be determined following appropriate treatment plan  PMH: Past Medical History:  Diagnosis Date  . Blood transfusion 2006  . Colon polyps   . CVA (cerebral infarction) 2013  . Dementia ([place])    vascular dementia   . Diverticulosis of colon   . DJD (degenerative joint disease)   . Hyperlipidemia   . Hypertension   . Hypothyroidism   . Melanoma ([place]) 03/20/15   removed with Moh's surgery  . Myelodysplasia 02/04/2012  . Normochromic normocytic anemia 02/04/2012  . Onychomycosis   . Osteoporosis   . Oxygen dependent    2l/[place] continuous   . Rectal prolapse   . Shoulder pain   . Unstable gait     PSH: Past Surgical History:  Procedure Laterality Date  . APPENDECTOMY    . CARPAL TUNNEL RELEASE Bilateral 2006  . FEMUR IM NAIL Right 11/09/2018   Procedure: INTRAMEDULLARY (IM) RETROGRADE FEMORAL NAILING;  Surgeon: Paralee Cancel, MD;  Location: [facility];  Service:  Orthopedics;  Laterality: Right;  . FLEXIBLE SIGMOIDOSCOPY  07/09/2012   Procedure: FLEXIBLE SIGMOIDOSCOPY;  Surgeon: Inda Castle, MD;  Location: [facility];  Service: Endoscopy;  Laterality: N/A;  . INCISION AND DRAINAGE PERIRECTAL ABSCESS N/A 09/21/2018   Procedure: IRRIGATION AND DEBRIDEMENT PERIRECTAL ABSCESS;  Surgeon: Rolm Bookbinder, MD;  Location: [facility];  Service: General;  Laterality: N/A;  . MELANOMA EXCISION Right 2005   arm  . POLYPECTOMY  2004   Laparoscopic  . RECTOCELE REPAIR    . REPLACEMENT TOTAL KNEE  2005 & 2006  . VAGINAL HYSTERECTOMY  1989    Social History:  reports that she has never smoked. She has never used smokeless tobacco. She reports that she does not drink alcohol or use drugs.  Allergies:  Allergies  Allergen Reactions  . Lisinopril Swelling  . Penicillins Rash    Has patient had a PCN reaction causing immediate rash, facial/tongue/throat swelling, SOB or lightheadedness with hypotension: Unknown Has patient had a PCN reaction causing severe rash involving mucus membranes or skin necrosis: Unknown Has patient had a PCN reaction that required hospitalization: Unknown Has patient had a PCN reaction occurring within the last 10 years: Unknown If all of the above answers are "NO", then may proceed with Cephalosporin use.    Medications: Medications Prior to Admission  Medication Sig Dispense Refill  . acetaminophen (TYLENOL) 500 MG tablet Take 500 mg by mouth See  admin instructions. Twice daily and every 8 hours as needed for pain 30 tablet   . aspirin 81 MG chewable tablet Chew 81 mg by mouth daily.    . cycloSPORINE (RESTASIS) 0.05 % ophthalmic emulsion Place 1 drop into both eyes 2 (two) times daily.      Marland Kitchen docusate sodium (COLACE) 100 MG capsule Take 1 capsule (100 mg total) by mouth 2 (two) times daily. 60 capsule 0  . donepezil (ARICEPT) 10 MG tablet Take 10 mg by mouth at bedtime.    . feeding supplement (BOOST / RESOURCE BREEZE) LIQD Take  90 mLs by mouth 3 (three) times daily between meals.    . ferrous sulfate 325 (65 FE) MG tablet Take 1 tablet (325 mg total) by mouth 3 (three) times daily after meals.  3  . hydroxypropyl methylcellulose / hypromellose (ISOPTO TEARS / GONIOVISC) 2.5 % ophthalmic solution Place 1 drop into both eyes every 2 (two) hours as needed for dry eyes.    Marland Kitchen lactose free nutrition (BOOST) LIQD Take 237 mLs by mouth 2 (two) times daily.    Marland Kitchen levothyroxine (SYNTHROID, LEVOTHROID) 88 MCG tablet Take 1 tablet (88 mcg total) by mouth daily before breakfast.    . Metoprolol Tartrate 37.5 MG TABS Take 37.5 mg by mouth 2 (two) times daily.    . mirtazapine (REMERON) 15 MG tablet Take 7.5 mg by mouth at bedtime.     Marland Kitchen oxyCODONE (ROXICODONE) 5 MG immediate release tablet Take 0.5 tablets (2.5 mg total) by mouth every 8 (eight) hours as needed. (Patient taking differently: Take 2.5 mg by mouth every 8 (eight) hours as needed (pain). ) 20 tablet 0  . OXYGEN Inhale 2 L into the lungs continuous. To keep sat above 90%    . polyethylene glycol powder (GLYCOLAX/MIRALAX) powder Take 17 g by mouth daily as needed for mild constipation.     Marland Kitchen PRESCRIPTION MEDICATION Take 1 application by mouth daily. Magic Cup at dinner    . Sodium Fluoride (PREVIDENT 5000 BOOSTER PLUS) 1.1 % PSTE Place 1 application onto teeth every morning. Brush on to teeth as directed    . ZINC OXIDE PO Take 1 application by mouth See admin instructions. After every incontinent episode and as needed      No results found for this or any previous visit (from the past 70 hour(s)). No results found.  ROS: Review of Systems - Negative except history of presenting illness  Physican Exam: Blood pressure (!) 143/64, pulse 60, temperature 98.1 F (36.7 C), temperature source Oral, resp. rate 18, height 4\' 11"  (1.499 m), weight 45.6 kg, SpO2 98 %. Awake alert She has family with her today. She is supine in the stretcher. Her right lower extremity is flexed  through her fracture site which is proximal to her knee.  There is no evidence of any skin penetration.  No evidence of infection. She otherwise is neurovascular intact. Motion around the knee and/or fracture site produces pain.  Thus this is limited on exam. General medical exam reviewed and noted to be stable.  Assessment/Plan Assessment: Right distal femoral malunion  Plan: Patient will undergo a open reduction internal fixation of the right distal femur malunion. Risks benefits and expectation were discussed with the patient. Patient understand risks, benefits and expectation and wishes to proceed.  She will be admitted to the hospital postoperatively. She will be nonweightbearing through the right lower extremity. We will work on disposition within a 2 to 3-day period of time back  to her nursing facility.   Pietro Cassis Alvan Dame, MD  02/22/2019, 3:56 PM

## 2019-02-22 NOTE — Progress Notes (Signed)
Son Brittany [name] at bedside preop and able to answer most questions.  States pt is DNR and will attempt to bring copy from [facility].  It is noted in Dr Steve Rattler report.

## 2019-02-22 NOTE — Brief Op Note (Signed)
02/22/2019  6:08 PM  PATIENT:  Brittany [name]  85 y.o. female  PRE-OPERATIVE DIAGNOSIS:  Right distal femur nonunion/malunion  POST-OPERATIVE DIAGNOSIS:  Right distal periprosthetic femur malunion  PROCEDURE:  Procedure(s) with comments: 1. Right distal femur malunion takedown 2. Revision ORIF right distal femur periprosthetic fracture   SURGEON:  Surgeon(s) and Role:    Paralee Cancel, MD - Primary  PHYSICIAN ASSISTANT: Molli Barrows  ANESTHESIA:   spinal  EBL:  150 mL   BLOOD ADMINISTERED:none  DRAINS: none   LOCAL MEDICATIONS USED:  NONE  SPECIMEN:  No Specimen  DISPOSITION OF SPECIMEN:  N/A  COUNTS:  YES  TOURNIQUET:  * No tourniquets in log *  DICTATION: .Other Dictation: Dictation Number [id number]  PLAN OF CARE: Admit to inpatient   PATIENT DISPOSITION:  PACU - hemodynamically stable.   Delay start of Pharmacological VTE agent (>24hrs) due to surgical blood loss or risk of bleeding: no

## 2019-02-23 ENCOUNTER — Other Ambulatory Visit: Payer: Self-pay

## 2019-02-23 LAB — BASIC METABOLIC PANEL
Anion gap: 7 (ref 5–15)
BUN: 32 mg/dL — ABNORMAL HIGH (ref 8–23)
CO2: 22 mmol/L (ref 22–32)
Calcium: 9.2 mg/dL (ref 8.9–10.3)
Chloride: 110 mmol/L (ref 98–111)
Creatinine, Ser: 1.05 mg/dL — ABNORMAL HIGH (ref 0.44–1.00)
GFR calc Af Amer: 53 mL/min — ABNORMAL LOW (ref >60)
GFR calc non Af Amer: 46 mL/min — ABNORMAL LOW (ref >60)
Glucose, Bld: 138 mg/dL — ABNORMAL HIGH (ref 70–99)
Potassium: 5.3 mmol/L — ABNORMAL HIGH (ref 3.5–5.1)
Sodium: 139 mmol/L (ref 135–145)

## 2019-02-23 LAB — CBC
HCT: 27.2 % — ABNORMAL LOW (ref 36.0–46.0)
Hemoglobin: 8.2 g/dL — ABNORMAL LOW (ref 12.0–15.0)
MCH: 26.8 pg (ref 26.0–34.0)
MCHC: 30.1 g/dL (ref 30.0–36.0)
MCV: 88.9 fL (ref 80.0–100.0)
Platelets: 235 10*3/uL (ref 150–400)
RBC: 3.06 MIL/uL — AB (ref 3.87–5.11)
RDW: 17.5 % — ABNORMAL HIGH (ref 11.5–15.5)
WBC: 8 10*3/uL (ref 4.0–10.5)
nRBC: 0 % (ref 0.0–0.2)

## 2019-02-23 MED ORDER — FUROSEMIDE 10 MG/ML IJ SOLN
10.0000 mg | Freq: Once | INTRAMUSCULAR | Status: AC
  Administered 2019-02-23: 10 mg via INTRAVENOUS
  Filled 2019-02-23: qty 2

## 2019-02-23 NOTE — Op Note (Signed)
NAMELAURIANNE, FLORESCA MEDICAL RECORD [id number] ACCOUNT [id number] DATE OF BIRTH:Nov 11, 1924 [facility]ILITY[facility] LOCATION[facility] PHYSICIAN:Navy Rothschild D. Terrian Sentell, MD  OPERATIVE REPORT  DATE OF PROCEDURE:  02/22/2019  PREOPERATIVE DIAGNOSIS:  Malunion, right distal periprosthetic femur fracture.  POSTOPERATIVE DIAGNOSIS:  Malunion, right distal periprosthetic femur fracture.  PROCEDURE: 1.  Nonunion takedown. 2.  Open reduction internal fixation of right distal femur nonunion utilizing a Biomet 15 hole lateral distal femoral plate with 5 distal screws and 4 proximal screws.  SURGEON:  Paralee Cancel, MD  ASSISTANT:  Molli Barrows.  Note that Mr. Chabon was present for the entirety of the case from preoperative positioning, perioperative management of the operative extremity, general facilitation of the case and primary wound closure.  ANESTHESIA:  Spinal.  SPECIMENS:  None.  COMPLICATIONS:  None.  BLOOD LOSS:  About 150 mL.  INDICATIONS:  The patient is a 85 year old female with medical comorbidities including dementia.  She had a history of a right distal periprosthetic fracture that was initially treated nonop and then subsequently required open reduction internal fixation  due to the fact it became an open fracture.  At that time, it was fixed with cables.  After initial evaluation where her fracture remained in anatomic position, she was seen back 6 weeks later with a malunited and apex anterior malunion of the fracture.   After a lengthy discussion with family despite the fact that she has limited mobility and functional level, her family wished to have the nonunion taken down and revised to a more anatomic position to prevent complication.  We discussed the risks and  benefits of the procedure and consent was obtained by family for this procedure.  DESCRIPTION OF PROCEDURE:  The patient was brought to the operative theater.  Once adequate anesthesia, preoperative antibiotics, Ancef  administered as well as vancomycin due to being swabbed positive for MRSA.  She was positioned supine.  The right  lower extremity was predraped out then prepped and draped in sterile fashion for exposure purposes.  A timeout was performed identifying the patient, the planned procedure and extremity.  An incision was made over the distal lateral femur at the level of  the joint line and then carried proximally to the area of her fracture angulation.  Sharp dissection was carried down to the iliotibial band.  Once this was exposed, I incised the iliotibial band, exposing the lateral femur.  I exposed the distal femur  and then the fracture site with significant callus formation.  All of her soft tissues were grossly adherent to the callus formation.  I had to take this down.  I then removed 2 of the distal cables keeping 2 proximal cables in place to keep the oblique  segment intact.  This allowed for further fracture callus takedown with the use of an osteotome.  Once this was done, I was able to reduce the fracture into an anatomic position, still with some callus in this area.  I selected a 15-hole plate.  The  plate was placed into the insertion jig and with the utilization of fluoroscopy, I was able to place the plate distally and place some pins for provisional fixation and then used clamps to evaluate the position of the plate on the femur.  Once I had the  fracture reduced in AP and lateral plane and the plate appeared to be well fixed laterally.  We started placing screws.  I first placed a distal screw and maintained clamped orientation and then checking again in AP and  lateral planes I eventually placed  a screw in the proximal segment, which was bicortical.  This reduced the plate to the bone quite well.  I then placed another screw proximally to provide further fixation and then worked distally.  I was able to get fairly good purchase on 2 of the 5  screws distally, but all 4 proximal bicortical  screws had a great purchase.  The 4 proximal screws were placed in the jig.  The 5 distal holes were placed using standard technique.  I did use locking caps on 4 of the distal 5 screws to make locking  screws to provide fixation.  Once this was carried out, final radiographs were obtained in AP and lateral planes.  The wound was irrigated with normal saline solution.  Note that I did have to make a separate small incision proximally for 2 of the more  proximal screws.  The wound was closed in layers with #1 Vicryl and a running Stratafix suture on the iliotibial band.  The remainder of the wound was closed with 2-0 Vicryl and staples.  The knee was then cleaned, dried and dressed sterilely with  Mepilex dressing.  The knee was wrapped in Ace wrap.  She was then brought to the recovery room in stable condition, tolerating the procedure well with a knee immobilizer.  Findings were reviewed with family.  She will be admitted to the hospital until transfer back to nursing facility.  She will be nonweightbearing, but knee range of motion will be permittable.  We will follow up in routine fashion in 2 weeks.  TN/NUANCE  D:02/22/2019 T:02/23/2019 JOB:[id number]

## 2019-02-23 NOTE — TOC Initial Note (Signed)
Transition of Care [facility]) - Initial/Assessment Note    Patient Details  Name: Brittany [name] MRN: [id number] Date of Birth: [birth year]  Transition of Care [facility]) CM/SW Contact:    Lia Hopping, LCSW Phone Number: 02/23/2019, 10:41 AM  Clinical Narrative:    CSW discussed discharge planning with the patient daughter Barnetta Chapel at bedside. Patient alert to self and slept during the assessment.  Daughter reports the patient is resident at [facility]. The patient is total care. Per daughter, the patient had progressed to sitting in a wheelchair about two weeks ago. Post Surgery, the patient will not complete physical therapy for 6 weeks. The daughter is hopeful the patient can will be able to work with PT soon after.    Expected Discharge Plan: Skilled Nursing Facility Barriers to Discharge: Insurance Authorization   Patient Goals and CMS Choice Patient states their goals for this hospitalization and ongoing recovery are:: Per daughter Barnetta Chapel- Patient will regain the ability to sit in wheelchair and beable to go to cafeteria with other residents.  CMS Medicare.gov Compare Post Acute Care list provided to:: Other (Comment Required) Choice offered to / list presented to : NA  Expected Discharge Plan and Services Expected Discharge Plan: [place] Discharge Planning Services: CM Consult Post Acute Care Choice: [place] Living arrangements for the past 2 months: [place]                          Prior Living Arrangements/Services Living arrangements for the past 2 months: [place] Lives with:: Facility Resident Patient language and need for interpreter reviewed:: No Do you feel safe going back to the place where you live?: Yes      Need for Family Participation in Patient Care: Yes (Comment) Care giver support system in place?: Yes (comment)   Criminal Activity/Legal Involvement Pertinent to Current  Situation/Hospitalization: No - Comment as needed  Activities of Daily Living Home Assistive Devices/Equipment: Wheelchair(lives at skilled [place]) ADL Screening (condition at time of admission) Patient's cognitive ability adequate to safely complete daily activities?: No Is the patient deaf or have difficulty hearing?: No Does the patient have difficulty seeing, even when wearing glasses/contacts?: No Does the patient have difficulty concentrating, remembering, or making decisions?: Yes(short term memory loss) Patient able to express need for assistance with ADLs?: Yes Does the patient have difficulty dressing or bathing?: Yes Independently performs ADLs?: No Communication: Independent Dressing (OT): Needs assistance Is this a change from baseline?: Pre-admission baseline Grooming: Needs assistance Is this a change from baseline?: Pre-admission baseline Feeding: Independent Bathing: Needs assistance Is this a change from baseline?: Pre-admission baseline Toileting: Needs assistance, Dependent(wears diaper at [facility]) Is this a change from baseline?: Pre-admission baseline In/Out Bed: Needs assistance Is this a change from baseline?: Pre-admission baseline Walks in Home: Dependent(wheelchair bound) Is this a change from baseline?: Pre-admission baseline Does the patient have difficulty walking or climbing stairs?: Yes Weakness of Legs: Both Weakness of Arms/Hands: Both  Permission Sought/Granted Permission sought to share information with : Facility Art therapist granted to share information with : Yes, Verbal Permission Granted  Share Information with NAME: Windell Norfolk  Permission granted to share info w AGENCY: [place] granted to share info w Relationship: Son and Daughter   Permission granted to share info w Contact Information: [phone], [phone]  Emotional Assessment Appearance::  Appears stated age   Affect (  typically observed): Appropriate, Other (comment)(Sleeping) Orientation: : Oriented to Self Alcohol / Substance Use: Not Applicable Psych Involvement: No (comment)  Admission diagnosis:  Right distal femur nonunion,malunion Patient Active Problem List   Diagnosis Date Noted  . S/P ORIF right distal femur fracture 02/22/2019  . Right knee pain 02/02/2019  . Protein-calorie malnutrition ([place]) 12/22/2018  . Anemia 11/10/2018  . Open right femoral fracture ([place]) 11/09/2018  . Non-pressure chronic ulcer of buttock, with necrosis of muscle ([place]) 11/04/2018  . Femur fracture, right ([place]) 10/31/2018  . Pancytopenia ([place]) 09/24/2018  . Palliative care encounter   . Perirectal inflammation 09/20/2018  . Leukocytosis 08/15/2018  . Weight loss 07/28/2018  . Actinic keratosis 06/26/2018  . Diastolic CHF ([place]) 23/34/3568  . History of CVA (cerebrovascular accident) 08/08/2017  . CKD (chronic kidney disease) stage 3, GFR 30-59 ml/min (HCC) 08/08/2017  . Anemia, iron deficiency 01/11/2016  . Unstable gait   . Shoulder pain   . History of melanoma 03/20/2015  . Constipation 07/18/2014  . Vascular dementia without behavioral disturbance ([place]) 11/11/2012  . Stroke ([place]) 12/17/2011  . Back pain 04/23/2011  . COLONIC POLYPS, HX OF 06/05/2009  . Myelodysplastic syndrome ([place]) 01/02/2009  . Hypothyroidism 08/21/2007  . Hyperlipidemia 08/21/2007  . Essential hypertension 08/21/2007  . DIVERTICULOSIS, COLON 08/21/2007  . OSTEOPOROSIS 08/21/2007  . SKIN CANCER, HX OF 08/21/2007   PCP:  Virgie Dad, MD Pharmacy:  No Pharmacies Listed    Social Determinants of Health (SDOH) Interventions    Readmission Risk Interventions 30 Day Unplanned Readmission Risk Score     Admission (Current) from 02/22/2019 in [place]  30 Day Unplanned Readmission Risk Score (%)  27 Filed at 02/23/2019 0801     This score is the patient's risk of an unplanned  readmission within 30 days of being discharged (0 -100%). The score is based on dignosis, age, lab data, medications, orders, and past utilization.   Low:  0-14.9   Medium: 15-21.9   High: 22-29.9   Extreme: 30 and above       No flowsheet data found.

## 2019-02-23 NOTE — NC FL2 (Addendum)
[place] MEDICAID FL2 LEVEL OF CARE SCREENING TOOL     IDENTIFICATION  Patient Name: Brittany [name] Birthdate: March 11, 1924 Sex: female Admission Date (Current Location): 02/22/2019  [facility] and [place] Number:  Herbalist and Address:  [facility],  [place] [address] [place]      Provider Number: [id number]  Attending Physician Name and Address:  Paralee Cancel, MD  Relative Name and Phone Number:       Current Level of Care: Hospital Recommended Level of Care: [place] Prior Approval Number:    Date Approved/Denied:   PASRR Number: [id number]  Discharge Plan: SNF    Current Diagnoses: Patient Active Problem List   Diagnosis Date Noted  . S/P ORIF right distal femur fracture 02/22/2019  . Right knee pain 02/02/2019  . Protein-calorie malnutrition ([place]) 12/22/2018  . Anemia 11/10/2018  . Open right femoral fracture ([place]) 11/09/2018  . Non-pressure chronic ulcer of buttock, with necrosis of muscle ([place]) 11/04/2018  . Femur fracture, right ([place]) 10/31/2018  . Pancytopenia ([place]) 09/24/2018  . Palliative care encounter   . Perirectal inflammation 09/20/2018  . Leukocytosis 08/15/2018  . Weight loss 07/28/2018  . Actinic keratosis 06/26/2018  . Diastolic CHF ([place]) 30/86/5784  . History of CVA (cerebrovascular accident) 08/08/2017  . CKD (chronic kidney disease) stage 3, GFR 30-59 ml/min (HCC) 08/08/2017  . Anemia, iron deficiency 01/11/2016  . Unstable gait   . Shoulder pain   . History of melanoma 03/20/2015  . Constipation 07/18/2014  . Vascular dementia without behavioral disturbance ([place]) 11/11/2012  . Stroke ([place]) 12/17/2011  . Back pain 04/23/2011  . COLONIC POLYPS, HX OF 06/05/2009  . Myelodysplastic syndrome ([place]) 01/02/2009  . Hypothyroidism 08/21/2007  . Hyperlipidemia 08/21/2007  . Essential hypertension 08/21/2007  . DIVERTICULOSIS, COLON 08/21/2007  . OSTEOPOROSIS 08/21/2007  . SKIN CANCER, HX OF  08/21/2007    Orientation RESPIRATION BLADDER Height & Weight     Self  Normal Incontinent Weight: 100 lb 8 oz (45.6 kg) Height:  4\' 11"  (149.9 cm)  BEHAVIORAL SYMPTOMS/MOOD NEUROLOGICAL BOWEL NUTRITION STATUS      Continent Diet(Regular Diet )  AMBULATORY STATUS COMMUNICATION OF NEEDS Skin   Extensive Assist Verbally Surgical wounds                       Personal Care Assistance Level of Assistance  Bathing, Feeding, Dressing Bathing Assistance: Maximum assistance Feeding assistance: Limited assistance Dressing Assistance: Maximum assistance     Functional Limitations Info  Sight, Hearing, Speech Sight Info: Adequate Hearing Info: Adequate Speech Info: Adequate    SPECIAL CARE FACTORS FREQUENCY  PT (By licensed PT), OT (By licensed OT)  2x/week                  Contractures Contractures Info: Not present    Additional Factors Info  Code Status, Allergies, Psychotropic Code Status Info: DNR  Allergies Info: Allergies: Lisinopril, Penicillins           Current Medications (02/23/2019):  This is the current hospital active medication list Current Facility-Administered Medications  Medication Dose Route Frequency Provider Last Rate Last Dose  . acetaminophen (TYLENOL) tablet 325-650 mg  325-650 mg Oral Q6H PRN Maurice March, PA-C   325 mg at 02/23/19 0831  . aspirin EC tablet 325 mg  325 mg Oral BID Maurice March, PA-C   325 mg at 02/23/19 0831  . docusate sodium (COLACE) capsule 100 mg  100 mg  Oral BID Maurice March, PA-C   100 mg at 02/23/19 2863  . ferrous sulfate tablet 325 mg  325 mg Oral TID PC Maurice March, PA-C   325 mg at 02/23/19 8177  . furosemide (LASIX) injection 10 mg  10 mg Intravenous Once Maurice March, PA-C      . HYDROcodone-acetaminophen (NORCO) 7.5-325 MG per tablet 1-2 tablet  1-2 tablet Oral Q4H PRN Maurice March, PA-C      . HYDROcodone-acetaminophen (NORCO/VICODIN) 5-325 MG per tablet 1-2 tablet  1-2 tablet  Oral Q4H PRN Maurice March, PA-C   1 tablet at 02/23/19 0831  . lactated ringers infusion   Intravenous Continuous Montez Hageman, MD 100 mL/hr at 02/23/19 0604    . menthol-cetylpyridinium (CEPACOL) lozenge 3 mg  1 lozenge Oral PRN Griffith Citron R, PA-C       Or  . phenol (CHLORASEPTIC) mouth spray 1 spray  1 spray Mouth/Throat PRN Maurice March, PA-C      . metoCLOPramide (REGLAN) tablet 5-10 mg  5-10 mg Oral Q8H PRN Maurice March, PA-C       Or  . metoCLOPramide (REGLAN) injection 5-10 mg  5-10 mg Intravenous Q8H PRN Maurice March, PA-C      . morphine 2 MG/ML injection 0.5-1 mg  0.5-1 mg Intravenous Q2H PRN Maurice March, PA-C      . ondansetron (ZOFRAN) tablet 4 mg  4 mg Oral Q6H PRN Maurice March, PA-C       Or  . ondansetron (ZOFRAN) injection 4 mg  4 mg Intravenous Q6H PRN Maurice March, PA-C         Discharge Medications: Please see discharge summary for a list of discharge medications.  Relevant Imaging Results:  Relevant Lab Results:   Additional Information SSN: [id number]  Lia Hopping, LCSW

## 2019-02-23 NOTE — Progress Notes (Signed)
Patient ID: Brittany [name], female   DOB: 01-05-1924, 85 y.o.   MRN: [id number] Subjective: 1 Day Post-Op Procedure(s) (LRB): REVISION OPEN REDUCTION INTERNAL FIXATION (ORIF) DISTAL FEMUR FRACTURE (Right)    Patient demented.  Awake and alert this am.  No reported issues  Objective:   VITALS:   Vitals:   02/22/19 2303 02/23/19 0343  BP: (!) 161/62 (!) 149/58  Pulse: 63 66  Resp: 15 14  Temp: 97.8 F (36.6 C) 97.7 F (36.5 C)  SpO2: 99% 100%    Neurovascular intact Incision: dressing C/D/I  LABS Recent Labs    02/22/19 1504 02/23/19 0336  HGB 10.3* 8.2*  HCT 34.7* 27.2*  WBC 6.0 8.0  PLT 281 235    Recent Labs    02/22/19 1504 02/23/19 0336  NA 141 139  K 4.4 5.3*  BUN 36* 32*  CREATININE 1.24* 1.05*  GLUCOSE 100* 138*    No results for input(s): LABPT, INR in the last 72 hours.   Assessment/Plan: 1 Day Post-Op Procedure(s) (LRB): REVISION OPEN REDUCTION INTERNAL FIXATION (ORIF) DISTAL FEMUR FRACTURE (Right)   Advance diet Up with therapy Discharge to SNF tomorrow or Thursday Lasix to address post op hyperkalemia Repeat labs in am  FeSO4

## 2019-02-23 NOTE — Plan of Care (Signed)
  Problem: Clinical Measurements: Goal: Will remain free from infection Outcome: Progressing   Problem: Pain Managment: Goal: General experience of comfort will improve Outcome: Progressing   Problem: Safety: Goal: Ability to remain free from injury will improve Outcome: Progressing   

## 2019-02-23 NOTE — Evaluation (Signed)
Physical Therapy Evaluation Patient Details Name: Brittany [name] MRN: [id number] DOB: 08-09-1924 Today's Date: 02/23/2019   History of Present Illness  85 Yo female from SNF admitted 02/22/19  with [place], right distal periprosthetic femur fracture sustained in 11/19. S/P revision ORIF right femur on 02/22/19. Patient has H/O CVA,dementia, Oxygen dependent, .    Clinical Impression  The patient was assisted by 2 for mobilizing and  sitting on bed edge for  5 minutes. Patient plans return to SNF. Pt admitted with above diagnosis. Pt currently with functional limitations due to the deficits listed below (see PT Problem List).  Pt will benefit from skilled PT to increase their independence and safety with mobility to allow discharge to the venue listed below.       Follow Up Recommendations SNF    Equipment Recommendations  None recommended by PT    Recommendations for Other Services       Precautions / Restrictions Precautions Precautions: Fall Precaution Comments: on oxygen Required Braces or Orthoses: Knee Immobilizer - Right Knee Immobilizer - Right: (no orders indicated for KI use. OP note states  KI placed, "ROM will be permittable" ) Restrictions RLE Weight Bearing: Non weight bearing      Mobility  Bed Mobility Overal bed mobility: Needs Assistance Bed Mobility: Supine to Sit;Sit to Supine;Rolling Rolling: Total assist;+2 for safety/equipment;+2 for physical assistance   Supine to sit: +2 for physical assistance;+2 for safety/equipment;HOB elevated Sit to supine: Total assist;+2 for physical assistance;+2 for safety/equipment   General bed mobility comments: assist with all aspects of mobility to sitting at bed edge and back into beds. Use of bed pad for sliding.  Transfers                 General transfer comment: NT  Ambulation/Gait                Stairs            Wheelchair Mobility    Modified Rankin (Stroke Patients Only)        Balance Overall balance assessment: Needs assistance Sitting-balance support: Bilateral upper extremity supported;Feet supported Sitting balance-Leahy Scale: Poor   Postural control: Posterior lean;Left lateral lean                                   Pertinent Vitals/Pain Pain Assessment: Faces Faces Pain Scale: Hurts whole lot Pain Location: right leg when moved Pain Descriptors / Indicators: Grimacing;Guarding;Moaning Pain Intervention(s): Monitored during session;Patient requesting pain meds-RN notified;Repositioned    Home Living Family/patient expects to be discharged to:: Skilled nursing facility                 Additional Comments: [facility]    Prior Function           Comments: per daughter, patient recently started PT for OOB  to [facility].     Hand Dominance   Dominant Hand: Right    Extremity/Trunk Assessment   Upper Extremity Assessment Upper Extremity Assessment: Generalized weakness    Lower Extremity Assessment Lower Extremity Assessment: RLE deficits/detail RLE Deficits / Details: in KI       Communication   Communication: HOH  Cognition Arousal/Alertness: Awake/alert Behavior During Therapy: WFL for tasks assessed/performed Overall Cognitive Status: History of cognitive impairments - at baseline  General Comments      Exercises     Assessment/Plan    PT Assessment Patient needs continued PT services  PT Problem List Decreased strength;Decreased range of motion;Decreased cognition;Decreased activity tolerance;Decreased knowledge of use of DME;Decreased balance;Decreased mobility;Decreased knowledge of precautions       PT Treatment Interventions DME instruction;Therapeutic activities;Functional mobility training;Therapeutic exercise;Patient/family education    PT Goals (Current goals can be found in the Care Plan section)  Acute Rehab PT Goals Patient  Stated Goal: [facility] PT Goal Formulation: With patient/family Time For Goal Achievement: 03/09/19 Potential to Achieve Goals: Fair    Frequency Min 2X/week   Barriers to discharge        Co-evaluation               AM-PAC PT "6 Clicks" Mobility  Outcome Measure Help needed turning from your back to your side while in a flat bed without using bedrails?: Total Help needed moving from lying on your back to sitting on the side of a flat bed without using bedrails?: Total Help needed moving to and from a bed to a chair (including a wheelchair)?: Total Help needed standing up from a chair using your arms (e.g., wheelchair or bedside chair)?: Total Help needed to walk in hospital room?: Total Help needed climbing 3-5 steps with a railing? : Total 6 Click Score: 6    End of Session Equipment Utilized During Treatment: Right knee immobilizer Activity Tolerance: Patient tolerated treatment well Patient left: in bed;with call bell/phone within reach;with bed alarm set;with family/visitor present Nurse Communication: Mobility status;Patient requests pain meds PT Visit Diagnosis: Unsteadiness on feet (R26.81);Pain Pain - Right/Left: Right Pain - part of body: Leg    Time: 8786-7672 PT Time Calculation (min) (ACUTE ONLY): 23 min   Charges:   PT Evaluation $PT Eval Low Complexity: 1 Low PT Treatments $Therapeutic Activity: 8-22 mins        Tresa Endo PT Acute Rehabilitation Services Pager [phone] Office [phone]    Claretha Cooper 02/23/2019, 4:37 PM

## 2019-02-24 ENCOUNTER — Encounter (HOSPITAL_COMMUNITY): Payer: Self-pay | Admitting: Orthopedic Surgery

## 2019-02-24 LAB — BASIC METABOLIC PANEL
Anion gap: 6 (ref 5–15)
BUN: 24 mg/dL — ABNORMAL HIGH (ref 8–23)
CALCIUM: 9 mg/dL (ref 8.9–10.3)
CO2: 23 mmol/L (ref 22–32)
CREATININE: 0.91 mg/dL (ref 0.44–1.00)
Chloride: 109 mmol/L (ref 98–111)
GFR calc Af Amer: >60 mL/min (ref >60)
GFR, EST NON AFRICAN AMERICAN: 55 mL/min — AB (ref >60)
Glucose, Bld: 110 mg/dL — ABNORMAL HIGH (ref 70–99)
Potassium: 4.3 mmol/L (ref 3.5–5.1)
Sodium: 138 mmol/L (ref 135–145)

## 2019-02-24 LAB — CBC
HCT: 24.6 % — ABNORMAL LOW (ref 36.0–46.0)
Hemoglobin: 7.5 g/dL — ABNORMAL LOW (ref 12.0–15.0)
MCH: 27.2 pg (ref 26.0–34.0)
MCHC: 30.5 g/dL (ref 30.0–36.0)
MCV: 89.1 fL (ref 80.0–100.0)
Platelets: 234 10*3/uL (ref 150–400)
RBC: 2.76 MIL/uL — ABNORMAL LOW (ref 3.87–5.11)
RDW: 17.6 % — ABNORMAL HIGH (ref 11.5–15.5)
WBC: 7.2 10*3/uL (ref 4.0–10.5)
nRBC: 0 % (ref 0.0–0.2)

## 2019-02-24 MED ORDER — HYDROCODONE-ACETAMINOPHEN 7.5-325 MG PO TABS: 1.0000 | ORAL_TABLET | ORAL | 0 refills | Status: DC | PRN

## 2019-02-24 MED ORDER — ASPIRIN 325 MG PO TBEC: 325.0000 mg | DELAYED_RELEASE_TABLET | Freq: Two times a day (BID) | ORAL | 0 refills | Status: AC

## 2019-02-24 MED ORDER — ONDANSETRON HCL 4 MG PO TABS: 4.0000 mg | ORAL_TABLET | Freq: Four times a day (QID) | ORAL | 0 refills | Status: AC | PRN

## 2019-02-24 NOTE — Progress Notes (Signed)
Attempted to call report to [facility] [place] no answer at ext that was indicated by SW   D Mateo Flow RN

## 2019-02-24 NOTE — TOC Transition Note (Addendum)
Transition of Care [facility]) - CM/SW Discharge Note   Patient Details  Name: Brittany [name] MRN: [id number] Date of Birth: 17-Mar-1924  [facility] CM/SW Contact:  Lia Hopping, [place] Phone Number: 02/24/2019, 1:06 PM   Clinical Narrative:    Patient returning to SNF.  PTAR arranged for transport.  Nurse call report to: [phone] ext. [phone]   Final next level of care: [place] Barriers to Discharge: No Barriers Identified   Patient Goals and CMS Choice Patient states their goals for this hospitalization and ongoing recovery are:: Per daughter Barnetta Chapel- Patient will regain the ability to sit in wheelchair and beable to go to cafeteria with other residents.  CMS Medicare.gov Compare Post Acute Care list provided to:: Other (Comment Required) Choice offered to / list presented to : NA  Discharge Placement              Patient [facility]: [facility] Patient to be transferred to facility by: [place] Name of family member notified: Daughter-[name] Patient and family notified of of transfer: 02/24/19  Discharge Plan and Services Discharge Planning Services: CM Consult Post Acute Care Choice: [place]                    Social Determinants of Health (SDOH) Interventions     Readmission Risk Interventions Readmission Risk Prevention Plan 02/23/2019  Transportation Screening Complete  PCP or Specialist Appt within 3-5 Days Complete  HRI or [place] Complete  Social Work Consult for [place] Planning/Counseling Complete  Some recent data might be hidden

## 2019-02-24 NOTE — Consult Note (Signed)
   [facility] CM Inpatient Consult   02/24/2019  Ahniya Mitchum 1924-07-29 [id number]   Patient chart has been reviewed for high risk score 26% for unplanned readmissions and multiple hospitalizations.   Chart review reveals patient a resident of an ALF and current disposition plan is for SNF.  No [facility] Care Management identifiable needs.  Netta Cedars, MSN, [place] [facility] Liaison Nurse Mobile Phone [phone]  Toll free office [phone]

## 2019-02-24 NOTE — Progress Notes (Signed)
  02/24/2019 2 Days Post-Op Procedure(s) (LRB): REVISION OPEN REDUCTION INTERNAL FIXATION (ORIF) DISTAL FEMUR FRACTURE (Right) Patient reports pain as moderate.   Patient seen in rounds for Dr. Alvan Dame. Patient is well, and has had no acute complaints or problems other than pain in the right leg. No acute events overnight. Foley catheter in place.  They will be NWB to the right. Plan is to go Skilled nursing facility after hospital stay.  Vital signs in last 24 hours: Temp:  [97.8 F (36.6 C)-98.7 F (37.1 C)] 98.6 F (37 C) (03/11 0504) Pulse Rate:  [68-93] 93 (03/11 0504) Resp:  [15-16] 15 (03/10 2112) BP: (124-148)/(55-63) 148/63 (03/11 0504) SpO2:  [99 %] 99 % (03/11 0504)  I&O's: I/O last 3 completed shifts: In: 2360.5 [P.O.:235; I.V.:2125.5] Out: 1725 [Urine:1725] Total I/O In: 120 [P.O.:120] Out: 0   Labs: Recent Labs    02/22/19 1504 02/23/19 0336 02/24/19 0539  HGB 10.3* 8.2* 7.5*   Recent Labs    02/23/19 0336 02/24/19 0539  WBC 8.0 7.2  RBC 3.06* 2.76*  HCT 27.2* 24.6*  PLT 235 234   Recent Labs    02/23/19 0336 02/24/19 0539  NA 139 138  K 5.3* 4.3  CL 110 109  CO2 22 23  BUN 32* 24*  CREATININE 1.05* 0.91  GLUCOSE 138* 110*  CALCIUM 9.2 9.0   No results for input(s): LABPT, INR in the last 72 hours.  Exam: General - Patient is Alert and Oriented Extremity - Neurologically intact Sensation intact distally Intact pulses distally Dorsiflexion/Plantar flexion intact Dressing - dressing C/D/I Motor Function - intact, moving foot and toes well on exam.   Past Medical History:  Diagnosis Date  . Blood transfusion 2006  . Colon polyps   . CVA (cerebral infarction) 2013  . Dementia ([place])    vascular dementia   . Diverticulosis of colon   . DJD (degenerative joint disease)   . Hyperlipidemia   . Hypertension   . Hypothyroidism   . Melanoma ([place]) 03/20/15   removed with Moh's surgery  . Myelodysplasia 02/04/2012  . Normochromic normocytic  anemia 02/04/2012  . Onychomycosis   . Osteoporosis   . Oxygen dependent    2l/[place] continuous   . Rectal prolapse   . Shoulder pain   . Unstable gait     Assessment/Plan: 2 Days Post-Op Procedure(s) (LRB): REVISION OPEN REDUCTION INTERNAL FIXATION (ORIF) DISTAL FEMUR FRACTURE (Right) Principal Problem:   S/P ORIF right distal femur fracture  Estimated body mass index is 20.3 kg/m as calculated from the following:   Height as of this encounter: 4\' 11"  (1.499 m).   Weight as of this encounter: 45.6 kg. Advance diet Discharge to SNF  DVT Prophylaxis - Aspirin Weight-bearing as tolerated D/C O2 and pulse ox, try on room air.  Plan for discharge back to SNF when her bed is available. Potassium is improved today, at 4.3. Patient is NWB to the right LE. She may discontinue the knee immobilizer. Foley catheter to remain in place. Use decubitus ulcer precautions regarding patient positioning. No physical therapy. Follow up in the office with Dr. Alvan Dame in 2 weeks.   Griffith Citron, PA-C Orthopedic Surgery [facility] [place]

## 2019-02-24 NOTE — Discharge Summary (Signed)
Physician Discharge Summary   Patient ID: Brittany [name] MRN: [id number] DOB/AGE: Oct 08, 1924 85 y.o.  Admit date: 02/22/2019 Discharge date: 02/24/2019  Primary Diagnosis: Malunion, right distal periprosthetic femur fracture.  Admission Diagnoses:  Past Medical History:  Diagnosis Date  . Blood transfusion 2006  . Colon polyps   . CVA (cerebral infarction) 2013  . Dementia ([place])    vascular dementia   . Diverticulosis of colon   . DJD (degenerative joint disease)   . Hyperlipidemia   . Hypertension   . Hypothyroidism   . Melanoma ([place]) 03/20/15   removed with Moh's surgery  . Myelodysplasia 02/04/2012  . Normochromic normocytic anemia 02/04/2012  . Onychomycosis   . Osteoporosis   . Oxygen dependent    2l/[place] continuous   . Rectal prolapse   . Shoulder pain   . Unstable gait    Discharge Diagnoses:   Principal Problem:   S/P ORIF right distal femur fracture  Estimated body mass index is 20.3 kg/m as calculated from the following:   Height as of this encounter: 4\' 11"  (1.499 m).   Weight as of this encounter: 45.6 kg.  Procedure:  Procedure(s) (LRB): REVISION OPEN REDUCTION INTERNAL FIXATION (ORIF) DISTAL FEMUR FRACTURE (Right)   Consults: None  HPI: The patient is a 85 year old female with medical comorbidities including dementia.  She had a history of a right distal periprosthetic fracture that was initially treated nonop and then subsequently required open reduction internal fixation  due to the fact it became an open fracture.  At that time, it was fixed with cables.  After initial evaluation where her fracture remained in anatomic position, she was seen back 6 weeks later with a malunited and apex anterior malunion of the fracture.   After a lengthy discussion with family despite the fact that she has limited mobility and functional level, her family wished to have the nonunion taken down and revised to a more anatomic position to prevent complication.  We discussed the  risks and  benefits of the procedure and consent was obtained by family for this procedure.  Laboratory Data: Admission on 02/22/2019  Component Date Value Ref Range Status  . MRSA, PCR 02/22/2019 POSITIVE* NEGATIVE Final   Comment: RESULT CALLED TO, READ BACK BY AND VERIFIED WITH: TEWSBERRY,M. RN @1731  ON 03.09.2020 BY COHEN,K   . Staphylococcus aureus 02/22/2019 POSITIVE* NEGATIVE Final   Comment: (NOTE) The Xpert SA Assay (FDA approved for NASAL specimens in patients 72 years of age and older), is one component of a comprehensive surveillance program. It is not intended to diagnose infection nor to guide or monitor treatment. Performed at [facility], [place] [address], [place], [place] [address]   . ABO/RH(D) 02/22/2019 O POS   Final  . Antibody Screen 02/22/2019 NEG   Final  . Sample Expiration 02/22/2019    Final                   Value:02/25/2019 Performed at [facility], [place] [address], [place], [place] [address]   . Sodium 02/22/2019 141  135 - 145 mmol/L Final  . Potassium 02/22/2019 4.4  3.5 - 5.1 mmol/L Final  . Chloride 02/22/2019 110  98 - 111 mmol/L Final  . CO2 02/22/2019 23  22 - 32 mmol/L Final  . Glucose, Bld 02/22/2019 100* 70 - 99 mg/dL Final  . BUN 02/22/2019 36* 8 - 23 mg/dL Final  . Creatinine, Ser 02/22/2019 1.24* 0.44 - 1.00 mg/dL Final  .  Calcium 02/22/2019 9.5  8.9 - 10.3 mg/dL Final  . GFR calc non Af Amer 02/22/2019 38* >60 mL/min Final  . GFR calc Af Amer 02/22/2019 44* >60 mL/min Final  . Anion gap 02/22/2019 8  5 - 15 Final   Performed at [facility], [place] [address], [place], [place] [address]  . WBC 02/22/2019 6.0  4.0 - 10.5 K/uL Final  . RBC 02/22/2019 3.87  3.87 - 5.11 MIL/uL Final  . Hemoglobin 02/22/2019 10.3* 12.0 - 15.0 g/dL Final  . HCT 02/22/2019 34.7* 36.0 - 46.0 % Final  . MCV 02/22/2019 89.7  80.0 - 100.0 fL Final  . MCH 02/22/2019 26.6  26.0 - 34.0 pg Final  . MCHC  02/22/2019 29.7* 30.0 - 36.0 g/dL Final  . RDW 02/22/2019 17.6* 11.5 - 15.5 % Final  . Platelets 02/22/2019 281  150 - 400 K/uL Final  . nRBC 02/22/2019 0.0  0.0 - 0.2 % Final   Performed at [facility], [place] [address] [place], [place] [address]  . WBC 02/23/2019 8.0  4.0 - 10.5 K/uL Final  . RBC 02/23/2019 3.06* 3.87 - 5.11 MIL/uL Final  . Hemoglobin 02/23/2019 8.2* 12.0 - 15.0 g/dL Final  . HCT 02/23/2019 27.2* 36.0 - 46.0 % Final  . MCV 02/23/2019 88.9  80.0 - 100.0 fL Final  . MCH 02/23/2019 26.8  26.0 - 34.0 pg Final  . MCHC 02/23/2019 30.1  30.0 - 36.0 g/dL Final  . RDW 02/23/2019 17.5* 11.5 - 15.5 % Final  . Platelets 02/23/2019 235  150 - 400 K/uL Final  . nRBC 02/23/2019 0.0  0.0 - 0.2 % Final   Performed at [facility], [place] [address], [place], [place] [address]  . Sodium 02/23/2019 139  135 - 145 mmol/L Final  . Potassium 02/23/2019 5.3* 3.5 - 5.1 mmol/L Final   DELTA CHECK NOTED  . Chloride 02/23/2019 110  98 - 111 mmol/L Final  . CO2 02/23/2019 22  22 - 32 mmol/L Final  . Glucose, Bld 02/23/2019 138* 70 - 99 mg/dL Final  . BUN 02/23/2019 32* 8 - 23 mg/dL Final  . Creatinine, Ser 02/23/2019 1.05* 0.44 - 1.00 mg/dL Final  . Calcium 02/23/2019 9.2  8.9 - 10.3 mg/dL Final  . GFR calc non Af Amer 02/23/2019 46* >60 mL/min Final  . GFR calc Af Amer 02/23/2019 53* >60 mL/min Final  . Anion gap 02/23/2019 7  5 - 15 Final   Performed at [facility], [place] [address], [place], [place] [address]  . WBC 02/24/2019 7.2  4.0 - 10.5 K/uL Final  . RBC 02/24/2019 2.76* 3.87 - 5.11 MIL/uL Final  . Hemoglobin 02/24/2019 7.5* 12.0 - 15.0 g/dL Final  . HCT 02/24/2019 24.6* 36.0 - 46.0 % Final  . MCV 02/24/2019 89.1  80.0 - 100.0 fL Final  . MCH 02/24/2019 27.2  26.0 - 34.0 pg Final  . MCHC 02/24/2019 30.5  30.0 - 36.0 g/dL Final  . RDW 02/24/2019 17.6* 11.5 - 15.5 % Final  . Platelets 02/24/2019 234  150 - 400 K/uL Final  . nRBC  02/24/2019 0.0  0.0 - 0.2 % Final   Performed at [facility], [place] [address] [place], [place] [address]  . Sodium 02/24/2019 138  135 - 145 mmol/L Final  . Potassium 02/24/2019 4.3  3.5 - 5.1 mmol/L Final   Comment: DELTA CHECK NOTED REPEATED TO VERIFY   . Chloride 02/24/2019 109  98 - 111 mmol/L Final  . CO2 02/24/2019 23  22 - 32 mmol/L Final  . Glucose, Bld 02/24/2019 110* 70 - 99 mg/dL Final  . BUN 02/24/2019 24* 8 - 23 mg/dL Final  . Creatinine, Ser 02/24/2019 0.91  0.44 - 1.00 mg/dL Final  . Calcium 02/24/2019 9.0  8.9 - 10.3 mg/dL Final  . GFR calc non Af Amer 02/24/2019 55* >60 mL/min Final  . GFR calc Af Amer 02/24/2019 >60  >60 mL/min Final  . Anion gap 02/24/2019 6  5 - 15 Final   Performed at [facility], [place] [address], [place], [place] [address]     X-Rays:Dg Knee 1-2 Views Right  Result Date: 02/22/2019 CLINICAL DATA:  85 year old female with right femur fracture. Subsequent encounter. EXAM: DG C-ARM 61-120 MIN; RIGHT KNEE - 1-2 VIEW Fluoroscopic time: 32 seconds. COMPARISON:  11/09/2018. FINDINGS: Four intraoperative C-arm views submitted for review after surgery. Right femoral long plate/screws and cerclage wires utilized to transfix distal femur fracture. Prior right knee replacement. IMPRESSION: Open reduction and internal fixation right femur fracture. Electronically Signed   By: Genia Del M.D.   On: 02/22/2019 18:18   Pelvis Portable  Result Date: 02/22/2019 CLINICAL DATA:  Postoperative revision open reduction internal fixation distal femoral fracture. EXAM: PORTABLE PELVIS 1-2 VIEWS COMPARISON:  Fluoroscopy 02/22/2019 FINDINGS: Rotated patient limits evaluation. The proximal portion of the fixation plate and screws in the right femur is included within the field of view and appears grossly intact. The entire postoperative region is not included. Skin clips consistent with recent surgery. Pelvis appears intact. SI joints and  symphysis pubis are not displaced. Surgical clips in the right pelvis. Degenerative changes in the lower lumbar spine and hips. IMPRESSION: Only the proximal portion of the fixation plate and screws in the right femur is included within the field of view. Visualized portions appear grossly intact. Electronically Signed   By: Lucienne Capers M.D.   On: 02/22/2019 19:43   Dg C-arm 1-60 Min  Result Date: 02/22/2019 CLINICAL DATA:  85 year old female with right femur fracture. Subsequent encounter. EXAM: DG C-ARM 61-120 MIN; RIGHT KNEE - 1-2 VIEW Fluoroscopic time: 32 seconds. COMPARISON:  11/09/2018. FINDINGS: Four intraoperative C-arm views submitted for review after surgery. Right femoral long plate/screws and cerclage wires utilized to transfix distal femur fracture. Prior right knee replacement. IMPRESSION: Open reduction and internal fixation right femur fracture. Electronically Signed   By: Genia Del M.D.   On: 02/22/2019 18:18    EKG: Orders placed or performed during the hospital encounter of 11/09/18  . ED EKG  . ED EKG  . EKG 12-Lead  . EKG 12-Lead  . EKG     Hospital Course: Brittany [name] is a 85 y.o. who was admitted to [facility]. They were brought to the operating room on 02/22/2019 and underwent Procedure(s): REVISION OPEN REDUCTION INTERNAL FIXATION (ORIF) DISTAL FEMUR FRACTURE.  Patient tolerated the procedure well and was later transferred to the recovery room and then to the orthopaedic floor for postoperative care. They were given PO and IV analgesics for pain control following their surgery. They were given 24 hours of postoperative antibiotics of  Anti-infectives (From admission, onward)   Start     Dose/Rate Route Frequency Ordered Stop   02/23/19 0600  ceFAZolin (ANCEF) IVPB 2g/100 mL premix     2 g 200 mL/hr over 30 Minutes Intravenous On call to O.R. 02/22/19 1432 02/22/19 1625   02/23/19 0400  ceFAZolin (  ANCEF) IVPB 2g/100 mL premix     2 g 200 mL/hr over  30 Minutes Intravenous Every 12 hours 02/22/19 2031 02/23/19 0410   02/22/19 2030  ceFAZolin (ANCEF) IVPB 2g/100 mL premix  Status:  Discontinued     2 g 200 mL/hr over 30 Minutes Intravenous Every 6 hours 02/22/19 2027 02/22/19 2029   02/22/19 1745  vancomycin (VANCOCIN) IVPB 1000 mg/200 mL premix     1,000 mg 200 mL/hr over 60 Minutes Intravenous STAT 02/22/19 1735 02/22/19 1754     and started on DVT prophylaxis in the form of Aspirin. Patient was designated as NWB to the right LE. Discharge planning consulted to help with postop disposition and equipment needs. Social work consulted for [facility] at [facility] where she [facility]. Patient has a history of dementia. Patient had an okay night on the evening of surgery. She was seen in rounds on POD #1. She had a potassium of 5.3, and she was given 10 mg IV Lasix. Pt was seen during rounds on day two and was ready for discharge to SNF pending bed placement. Potassium was improved at 4.3. Knee immobilizer discontinued. She will remain NWB with transfers only for 6 weeks. No PT for 6 weeks. Awaiting discharge to SNF.  Diet: Regular diet Activity: NWB Follow-up: in 2 weeks Disposition: Skilled nursing facility Discharged Condition: good   Discharge Instructions    Call MD / Call 911   Complete by:  As directed    If you experience chest pain or shortness of breath, CALL 911 and be transported to the hospital emergency room.  If you develope a fever above 101 F, pus ([place] drainage) or increased drainage or redness at the wound, or calf pain, call your surgeon's office.   Change dressing   Complete by:  As directed    Maintain surgical dressing until follow up in the clinic. If the edges start to pull up, may reinforce with tape. If the dressing is no longer working, may remove and cover with gauze and tape, but must keep the area dry and clean.  Call with any questions or concerns.   Change dressing   Complete by:  As directed    Maintain surgical  dressing until follow up in the clinic. If the edges start to pull up, may reinforce with tape. If the dressing is no longer working, may remove and cover with gauze and tape, but must keep the area dry and clean.  Call with any questions or concerns.   Constipation Prevention   Complete by:  As directed    Drink plenty of fluids.  Prune juice may be helpful.  You may use a stool softener, such as Colace (over the counter) 100 mg twice a day.  Use MiraLax (over the counter) for constipation as needed.   Diet - low sodium heart healthy   Complete by:  As directed    Discharge instructions   Complete by:  As directed    Maintain surgical dressing until follow up in the clinic. If the edges start to pull up, may reinforce with tape. If the dressing is no longer working, may remove and cover with gauze and tape, but must keep the area dry and clean.  Follow up in 2 weeks at [facility]. Call with any questions or concerns.   Non weight bearing   Complete by:  As directed    Transfer from bed to chair/bathroom only.   TED hose   Complete by:  As  directed    Use stockings (TED hose) for 2 weeks on both leg(s).  You may remove them at night for sleeping.   TED hose   Complete by:  As directed    Use stockings (TED hose) for 2 weeks on both leg(s).  You may remove them at night for sleeping.     Allergies as of 02/24/2019      Reactions   Lisinopril Swelling   Penicillins Rash   Has patient had a PCN reaction causing immediate rash, facial/tongue/throat swelling, SOB or lightheadedness with hypotension: Unknown Has patient had a PCN reaction causing severe rash involving mucus membranes or skin necrosis: Unknown Has patient had a PCN reaction that required hospitalization: Unknown Has patient had a PCN reaction occurring within the last 10 years: Unknown If all of the above answers are "NO", then may proceed with Cephalosporin use.      Medication List    STOP taking these  medications   acetaminophen 500 MG tablet Commonly known as:  TYLENOL   aspirin 81 MG chewable tablet Replaced by:  aspirin 325 MG EC tablet   oxyCODONE 5 MG immediate release tablet Commonly known as:  Roxicodone     TAKE these medications   aspirin 325 MG EC tablet Take 1 tablet (325 mg total) by mouth 2 (two) times daily for 28 days. Then resume normal dose. Replaces:  aspirin 81 MG chewable tablet   cycloSPORINE 0.05 % ophthalmic emulsion Commonly known as:  RESTASIS Place 1 drop into both eyes 2 (two) times daily.   docusate sodium 100 MG capsule Commonly known as:  COLACE Take 1 capsule (100 mg total) by mouth 2 (two) times daily.   donepezil 10 MG tablet Commonly known as:  ARICEPT Take 10 mg by mouth at bedtime.   feeding supplement Liqd Commonly known as:  BOOST / RESOURCE BREEZE Take 90 mLs by mouth 3 (three) times daily between meals.   ferrous sulfate 325 (65 FE) MG tablet Take 1 tablet (325 mg total) by mouth 3 (three) times daily after meals.   HYDROcodone-acetaminophen 7.5-325 MG tablet Commonly known as:  NORCO Take 1-2 tablets by mouth every 4 (four) hours as needed for severe pain (pain score 7-10).   hydroxypropyl methylcellulose / hypromellose 2.5 % ophthalmic solution Commonly known as:  ISOPTO TEARS / GONIOVISC Place 1 drop into both eyes every 2 (two) hours as needed for dry eyes.   lactose free nutrition Liqd Take 237 mLs by mouth 2 (two) times daily.   levothyroxine 88 MCG tablet Commonly known as:  SYNTHROID, LEVOTHROID Take 1 tablet (88 mcg total) by mouth daily before breakfast.   Metoprolol Tartrate 37.5 MG Tabs Take 37.5 mg by mouth 2 (two) times daily.   mirtazapine 15 MG tablet Commonly known as:  REMERON Take 7.5 mg by mouth at bedtime.   ondansetron 4 MG tablet Commonly known as:  ZOFRAN Take 1 tablet (4 mg total) by mouth every 6 (six) hours as needed for nausea.   OXYGEN Inhale 2 L into the lungs continuous. To keep sat  above 90%   polyethylene glycol powder powder Commonly known as:  GLYCOLAX/MIRALAX Take 17 g by mouth daily as needed for mild constipation.   PRESCRIPTION MEDICATION Take 1 application by mouth daily. Magic Cup at dinner   PreviDent 5000 Booster Plus 1.1 % Pste Generic drug:  Sodium Fluoride Place 1 application onto teeth every morning. Brush on to teeth as directed   ZINC OXIDE PO Take  1 application by mouth See admin instructions. After every incontinent episode and as needed            Discharge Care Instructions  (From admission, onward)         Start     Ordered   02/24/19 0000  Change dressing    Comments:  Maintain surgical dressing until follow up in the clinic. If the edges start to pull up, may reinforce with tape. If the dressing is no longer working, may remove and cover with gauze and tape, but must keep the area dry and clean.  Call with any questions or concerns.   02/24/19 1214   02/24/19 0000  Change dressing    Comments:  Maintain surgical dressing until follow up in the clinic. If the edges start to pull up, may reinforce with tape. If the dressing is no longer working, may remove and cover with gauze and tape, but must keep the area dry and clean.  Call with any questions or concerns.   02/24/19 1214   02/24/19 0000  Non weight bearing    Comments:  Transfer from bed to chair/bathroom only.   02/24/19 1214          Contact information for follow-up providers    Paralee Cancel, MD. Schedule an appointment as soon as possible for a visit in 2 week(s).   Specialty:  Orthopedic Surgery Contact information: [address] [place] [address] [phone]            Contact information for after-discharge care    Destination    [facility] .   Service:  Skilled Nursing Contact information: [address] [place]                  Signed: Griffith Citron,  PA-C Orthopedic Surgery 02/24/2019, 12:14 PM

## 2019-02-24 NOTE — Plan of Care (Signed)
  Problem: Clinical Measurements: Goal: Will remain free from infection Outcome: Progressing   Problem: Pain Managment: Goal: General experience of comfort will improve Outcome: Progressing   Problem: Safety: Goal: Ability to remain free from injury will improve Outcome: Progressing   

## 2019-02-25 LAB — CBC AND DIFFERENTIAL
HCT: 25 — AB (ref 36–46)
Hemoglobin: 8.2 — AB (ref 12.0–16.0)
Platelets: 295 (ref 150–399)
WBC: 8.1

## 2019-02-25 LAB — IRON,TIBC AND FERRITIN PANEL
Ferritin: 1446
Iron: 32

## 2019-02-26 ENCOUNTER — Non-Acute Institutional Stay (SKILLED_NURSING_FACILITY): Payer: Medicare Other | Admitting: Internal Medicine

## 2019-02-26 ENCOUNTER — Encounter: Payer: Self-pay | Admitting: Internal Medicine

## 2019-02-26 DIAGNOSIS — Z8781 Personal history of (healed) traumatic fracture: Secondary | ICD-10-CM

## 2019-02-26 DIAGNOSIS — I10 Essential (primary) hypertension: Secondary | ICD-10-CM

## 2019-02-26 DIAGNOSIS — N183 Chronic kidney disease, stage 3 unspecified: Secondary | ICD-10-CM

## 2019-02-26 DIAGNOSIS — D469 Myelodysplastic syndrome, unspecified: Secondary | ICD-10-CM

## 2019-02-26 DIAGNOSIS — Z9889 Other specified postprocedural states: Secondary | ICD-10-CM

## 2019-02-26 DIAGNOSIS — F0150 Vascular dementia without behavioral disturbance: Secondary | ICD-10-CM

## 2019-02-26 DIAGNOSIS — I5032 Chronic diastolic (congestive) heart failure: Secondary | ICD-10-CM | POA: Diagnosis not present

## 2019-02-26 DIAGNOSIS — E039 Hypothyroidism, unspecified: Secondary | ICD-10-CM | POA: Diagnosis not present

## 2019-02-26 DIAGNOSIS — E785 Hyperlipidemia, unspecified: Secondary | ICD-10-CM

## 2019-02-26 DIAGNOSIS — S72401S Unspecified fracture of lower end of right femur, sequela: Secondary | ICD-10-CM

## 2019-02-26 NOTE — Progress Notes (Signed)
Provider:  Veleta Miners L,MD  Location:  [place] Room Number: 26 Place of Service:  SNF [phone])  PCP: Virgie Dad, MD Patient Care Team: Virgie Dad, MD as PCP - General (Internal Medicine) Milus Banister, MD as Consulting Physician (Gastroenterology) Heath Lark, MD as Consulting Physician (Hematology and Oncology) Mast, Man X, NP as Nurse Practitioner (Internal Medicine)  Extended Emergency Contact Information Primary Emergency Contact: [facility] Address: [place], [place] [address] [name] Phone: [phone] Mobile Phone: [phone] Relation: Son Secondary Emergency Contact: [name],[name] Address: [address] [place] [address] [place] Phone: [phone] Relation: Daughter  Code Status:DNR Goals of Care: Advanced Directive information Advanced Directives 02/26/2019  Does Patient Have a Medical Advance Directive? Yes  Type of Paramedic of [place];Out of facility DNR (pink MOST or yellow form)  Does patient want to make changes to medical advance directive? No - Patient declined  Copy of Peter in Chart? Yes - validated most recent copy scanned in chart (See row information)  Would patient like information on creating a medical advance directive? -  Pre-existing out of facility DNR order (yellow form or pink MOST form) Yellow form placed in chart (order not valid for inpatient use)      Chief Complaint  Patient presents with   Readmit To SNF    readmit to facility     HPI: Patient is a 85 y.o. female seen today for   Readmit to facility. She was the hospital from 03/09 to 3/11 for malunion of right distal periprostatic femur fracture and underwent revision ORIF of distal fracture.  Patient has h/o right distal periprostatic femur fracture status post ORIF in 11/19, UTI with ESBL, anemia, myelodysplastic  syndrome, dementia, hypertension, hypothyroidism  Patient initially had ORIF in December.  But she continued to have malunion of the fracture.  It was decided due to the do the revision. Patient had uncomplicated postop.  To be NWB with transfers only for 6 weeks.  No PT for 6 weeks. Patient is now in SNF for long-term care and eventually for therapy She was very sleepy today tired.  Seems her pain is well controlled.  She was unable to answer anything about the hospitalization per nurses her pressure wound has healed not eating that well only 25% of her meal Past Medical History:  Diagnosis Date   Blood transfusion 2006   Colon polyps    CVA (cerebral infarction) 2013   Dementia [facility])    vascular dementia    Diverticulosis of colon    DJD (degenerative joint disease)    Hyperlipidemia    Hypertension    Hypothyroidism    Melanoma ([place]) 03/20/15   removed with Moh's surgery   Myelodysplasia 02/04/2012   Normochromic normocytic anemia 02/04/2012   Onychomycosis    Osteoporosis    Oxygen dependent    2l/[place] continuous    Rectal prolapse    Shoulder pain    Unstable gait    Past Surgical History:  Procedure Laterality Date   APPENDECTOMY     CARPAL TUNNEL RELEASE Bilateral 2006   FEMUR IM NAIL Right 11/09/2018   Procedure: INTRAMEDULLARY (IM) RETROGRADE FEMORAL NAILING;  Surgeon: Paralee Cancel, MD;  Location: [facility];  Service: Orthopedics;  Laterality: Right;   FLEXIBLE SIGMOIDOSCOPY  07/09/2012  Procedure: FLEXIBLE SIGMOIDOSCOPY;  Surgeon: Inda Castle, MD;  Location: [facility];  Service: Endoscopy;  Laterality: N/A;   INCISION AND DRAINAGE PERIRECTAL ABSCESS N/A 09/21/2018   Procedure: IRRIGATION AND DEBRIDEMENT PERIRECTAL ABSCESS;  Surgeon: Rolm Bookbinder, MD;  Location: [place];  Service: General;  Laterality: N/A;   MELANOMA EXCISION Right 2005   arm   ORIF FEMUR FRACTURE Right 02/22/2019   Procedure: REVISION OPEN REDUCTION INTERNAL FIXATION  (ORIF) DISTAL FEMUR FRACTURE;  Surgeon: Paralee Cancel, MD;  Location: [facility];  Service: Orthopedics;  Laterality: Right;  90 mins Alvan Dame can start at 3 or 3:30 if time opens up.   POLYPECTOMY  2004   Laparoscopic   RECTOCELE REPAIR     REPLACEMENT TOTAL KNEE  2005 & 2006   [place]    reports that she has never smoked. She has never used smokeless tobacco. She reports that she does not drink alcohol or use drugs. Social History   Socioeconomic History   Marital status: Widowed    Spouse name: Not on file   Number of children: Not on file   Years of education: Not on file   Highest education level: Not on file  Occupational History   Occupation: English as a second language teacher  Social Needs   Financial resource strain: Not hard at all   Food insecurity:    Worry: Never true    Inability: Never true   Transportation needs:    Medical: No    Non-medical: No  Tobacco Use   Smoking status: Never Smoker   Smokeless tobacco: Never Used  Substance and Sexual Activity   Alcohol use: No   Drug use: No   Sexual activity: Yes    Birth control/protection: Surgical    Comment: HYST  Lifestyle   Physical activity:    Days per week: 0 days    Minutes per session: 0 min   Stress: Not at all  Relationships   Social connections:    Talks on phone: More than three times a week    Gets together: More than three times a week    Attends religious service: Never    Active member of club or organization: No    Attends meetings of clubs or organizations: Never    Relationship status: Married   Intimate partner violence:    Fear of current or ex partner: No    Emotionally abused: No    Physically abused: No    Forced sexual activity: No  Other Topics Concern   Not on file  Social History Narrative   Lives [facility], moved to [place] 12/28/14   married   Never smoked   Alcohol none   Caffeine Coffee 1 cup   Exercise none   Son Marylou Wages is POA, May  update    Functional Status Survey:    Family History  Problem Relation Age of Onset   Prostate cancer Brother    Cancer Brother        renal cell carcinoma   Heart disease Brother    Breast cancer Mother 12   Cancer Father        unknown kind   Arthritis Daughter     Health Maintenance  Topic Date Due   OPHTHALMOLOGY EXAM  11/15/2014   FOOT EXAM  11/30/2014   URINE MICROALBUMIN  11/30/2014   HEMOGLOBIN A1C  10/11/2015   TETANUS/TDAP  11/10/2028   INFLUENZA VACCINE  Completed   DEXA SCAN  Completed  PNA vac Low Risk Adult  Completed    Allergies  Allergen Reactions   Lisinopril Swelling   Penicillins Rash    Has patient had a PCN reaction causing immediate rash, facial/tongue/throat swelling, SOB or lightheadedness with hypotension: Unknown Has patient had a PCN reaction causing severe rash involving mucus membranes or skin necrosis: Unknown Has patient had a PCN reaction that required hospitalization: Unknown Has patient had a PCN reaction occurring within the last 10 years: Unknown If all of the above answers are "NO", then may proceed with Cephalosporin use.    Outpatient Encounter Medications as of 02/26/2019  Medication Sig   aspirin EC 325 MG EC tablet Take 1 tablet (325 mg total) by mouth 2 (two) times daily for 28 days. Then resume normal dose.   aspirin EC 81 MG tablet Take 81 mg by mouth daily.   cycloSPORINE (RESTASIS) 0.05 % ophthalmic emulsion Place 1 drop into both eyes 2 (two) times daily.     docusate sodium (COLACE) 100 MG capsule Take 1 capsule (100 mg total) by mouth 2 (two) times daily.   donepezil (ARICEPT) 10 MG tablet Take 10 mg by mouth at bedtime.   feeding supplement (BOOST / RESOURCE BREEZE) LIQD Take 90 mLs by mouth 3 (three) times daily between meals.   ferrous sulfate 325 (65 FE) MG tablet Take 1 tablet (325 mg total) by mouth 3 (three) times daily after meals.   HYDROcodone-acetaminophen (NORCO) 7.5-325 MG tablet  Take 1-2 tablets by mouth every 4 (four) hours as needed for severe pain (pain score 7-10).   hydroxypropyl methylcellulose / hypromellose (ISOPTO TEARS / GONIOVISC) 2.5 % ophthalmic solution Place 1 drop into both eyes every 2 (two) hours as needed for dry eyes.   lactose free nutrition (BOOST) LIQD Take 237 mLs by mouth 2 (two) times daily.   levothyroxine (SYNTHROID, LEVOTHROID) 88 MCG tablet Take 1 tablet (88 mcg total) by mouth daily before breakfast.   Metoprolol Tartrate 37.5 MG TABS Take 37.5 mg by mouth 2 (two) times daily.   mirtazapine (REMERON) 15 MG tablet Take 7.5 mg by mouth at bedtime.    ondansetron (ZOFRAN) 4 MG tablet Take 1 tablet (4 mg total) by mouth every 6 (six) hours as needed for nausea.   OXYGEN Inhale 2 L into the lungs continuous. To keep sat above 90%   polyethylene glycol powder (GLYCOLAX/MIRALAX) powder Take 17 g by mouth daily as needed for mild constipation.    PRESCRIPTION MEDICATION Take 1 application by mouth daily. Magic Cup at dinner   Sodium Fluoride (PREVIDENT 5000 BOOSTER PLUS) 1.1 % PSTE Place 1 application onto teeth every morning. Brush on to teeth as directed   ZINC OXIDE PO Take 1 application by mouth See admin instructions. After every incontinent episode and as needed   No facility-administered encounter medications on file as of 02/26/2019.     Review of Systems  Unable to perform ROS: Dementia    Vitals:   02/26/19 0902  BP: 104/60  Pulse: 81  Resp: 20  Temp: (!) 97.3 F (36.3 C)  SpO2: 91%  Weight: 113 lb (51.3 kg)  Height: 4\' 11"  (1.499 m)   Body mass index is 22.82 kg/m. Physical Exam Constitutional:      Appearance: Normal appearance.  HENT:     Head: Normocephalic.     Nose: Nose normal.     Mouth/Throat:     Mouth: Mucous membranes are moist.     Pharynx: Oropharynx is clear.  Eyes:  Pupils: Pupils are equal, round, and reactive to light.  Neck:     Musculoskeletal: Neck supple.  Cardiovascular:      Rate and Rhythm: Normal rate and regular rhythm.     Pulses: Normal pulses.     Heart sounds: Normal heart sounds.  Pulmonary:     Effort: Pulmonary effort is normal.     Breath sounds: Normal breath sounds.  Abdominal:     General: Abdomen is flat. Bowel sounds are normal.     Palpations: Abdomen is soft.  Musculoskeletal:     Comments: Mild swelling in right leg  Skin:    General: Skin is warm and dry.  Neurological:     General: No focal deficit present.     Comments: Was sleepy. Was Unable to do detail exam  Psychiatric:        Mood and Affect: Mood normal.        Thought Content: Thought content normal.        Judgment: Judgment normal.     Labs reviewed: Basic Metabolic Panel: Recent Labs    09/24/18 0330 09/25/18 0539  11/11/18 0503  02/22/19 1504 02/23/19 0336 02/24/19 0539  NA 142 141   < > 142   < > 141 139 138  K 4.7 4.6   < > 5.1   < > 4.4 5.3* 4.3  CL 109 105   < > 111   < > 110 110 109  CO2 28 30   < > 21*   < > 23 22 23   GLUCOSE 101* 89   < > 115*   < > 100* 138* 110*  BUN 19 14   < > 42*   < > 36* 32* 24*  CREATININE 0.87 0.86   < > 1.05*   < > 1.24* 1.05* 0.91  CALCIUM 8.7* 8.8*   < > 8.8*   < > 9.5 9.2 9.0  MG 1.5* 1.7  --  1.7  --   --   --   --    < > = values in this interval not displayed.   Liver Function Tests: Recent Labs    09/17/18 0445 09/20/18 0706 09/21/18 0645  AST 43* 17 20  ALT 61* 31 25  ALKPHOS 97 72 64  BILITOT 1.0 1.1 0.6  PROT 5.2* 4.9* 5.2*  ALBUMIN 2.3* 2.2* 2.2*   No results for input(s): LIPASE, AMYLASE in the last 8760 hours. No results for input(s): AMMONIA in the last 8760 hours. CBC: Recent Labs    11/09/18 1424  11/19/18 1006 12/17/18 1058 02/22/19 1504 02/23/19 0336 02/24/19 0539 02/25/19  WBC 12.5*   < > 7.3 9.0 6.0 8.0 7.2 8.1  NEUTROABS 8.5*  --  4.1 6.0  --   --   --   --   HGB 10.3*   < > 9.7* 11.6* 10.3* 8.2* 7.5* 8.2*  HCT 34.1*   < > 30.9* 37.9 34.7* 27.2* 24.6* 25*  MCV 91.2   < > 92.8  88.3 89.7 88.9 89.1  --   PLT 283   < > 290 278 281 235 234 295   < > = values in this interval not displayed.   Cardiac Enzymes: No results for input(s): CKTOTAL, CKMB, CKMBINDEX, TROPONINI in the last 8760 hours. BNP: Invalid input(s): POCBNP Lab Results  Component Value Date   HGBA1C 5.6 04/11/2015   Lab Results  Component Value Date   TSH 1.88 08/11/2018   Lab Results  Component  Value Date   VITAMINB12 2,000 08/11/2018   Lab Results  Component Value Date   FOLATE >20.0 02/11/2007   Lab Results  Component Value Date   IRON 32 02/25/2019   TIBC 288 08/09/2014   FERRITIN 1,446 02/25/2019    Imaging and Procedures obtained prior to SNF admission: Dg Knee 1-2 Views Right  Result Date: 02/22/2019 CLINICAL DATA:  85 year old female with right femur fracture. Subsequent encounter. EXAM: DG C-ARM 61-120 MIN; RIGHT KNEE - 1-2 VIEW Fluoroscopic time: 32 seconds. COMPARISON:  11/09/2018. FINDINGS: Four intraoperative C-arm views submitted for review after surgery. Right femoral long plate/screws and cerclage wires utilized to transfix distal femur fracture. Prior right knee replacement. IMPRESSION: Open reduction and internal fixation right femur fracture. Electronically Signed   By: Genia Del M.D.   On: 02/22/2019 18:18   Pelvis Portable  Result Date: 02/22/2019 CLINICAL DATA:  Postoperative revision open reduction internal fixation distal femoral fracture. EXAM: PORTABLE PELVIS 1-2 VIEWS COMPARISON:  Fluoroscopy 02/22/2019 FINDINGS: Rotated patient limits evaluation. The proximal portion of the fixation plate and screws in the right femur is included within the field of view and appears grossly intact. The entire postoperative region is not included. Skin clips consistent with recent surgery. Pelvis appears intact. SI joints and symphysis pubis are not displaced. Surgical clips in the right pelvis. Degenerative changes in the lower lumbar spine and hips. IMPRESSION: Only the  proximal portion of the fixation plate and screws in the right femur is included within the field of view. Visualized portions appear grossly intact. Electronically Signed   By: Lucienne Capers M.D.   On: 02/22/2019 19:43   Dg C-arm 1-60 Min  Result Date: 02/22/2019 CLINICAL DATA:  85 year old female with right femur fracture. Subsequent encounter. EXAM: DG C-ARM 61-120 MIN; RIGHT KNEE - 1-2 VIEW Fluoroscopic time: 32 seconds. COMPARISON:  11/09/2018. FINDINGS: Four intraoperative C-arm views submitted for review after surgery. Right femoral long plate/screws and cerclage wires utilized to transfix distal femur fracture. Prior right knee replacement. IMPRESSION: Open reduction and internal fixation right femur fracture. Electronically Signed   By: Genia Del M.D.   On: 02/22/2019 18:18    Assessment/Plan  S/P ORIF right distal femur fracture Pain control NWB In Aspirin Folow up with ortho On Norco PRN for Pain  Essential hypertension On metoprolol Stable  Chronic diastolic congestive heart failure  Hypothyroidism,  On synthyroid Repeat TSH  Vascular dementia  Supportive care Continue Aricept for Now  CKD (chronic kidney disease) stage 3, Creat stable Repeat BMP  Myelodysplastic syndrome with anmeia Repeat HGB was 8.2 will repeat in 1 week On iron Weight Loss  Decrase Aricept ? Incrase her Remeron next visit Have d/w Dietician . Keeping Close monitor     Family/ staff Communication:   Labs/tests ordered: Total time spent in this patient care encounter was 45_ minutes; greater than 50% of the visit spent counseling patient, reviewing records , Labs and coordinating care for problems addressed at this encounter.

## 2019-03-04 LAB — BASIC METABOLIC PANEL
BUN: 21 (ref 4–21)
Creatinine: 0.8 (ref 0.5–1.1)
Glucose: 69
Potassium: 4.5 (ref 3.4–5.3)
Sodium: 142 (ref 137–147)

## 2019-03-04 LAB — CBC AND DIFFERENTIAL
HCT: 25 — AB (ref 36–46)
Hemoglobin: 7.9 — AB (ref 12.0–16.0)
Platelets: 559 — AB (ref 150–399)
WBC: 3.6

## 2019-03-04 LAB — TSH: TSH: 2.31 (ref 0.41–5.90)

## 2019-03-10 ENCOUNTER — Non-Acute Institutional Stay (SKILLED_NURSING_FACILITY): Payer: Medicare Other | Admitting: Family

## 2019-03-10 ENCOUNTER — Encounter: Payer: Self-pay | Admitting: Family

## 2019-03-10 DIAGNOSIS — Z4802 Encounter for removal of sutures: Secondary | ICD-10-CM | POA: Diagnosis not present

## 2019-03-10 DIAGNOSIS — R634 Abnormal weight loss: Secondary | ICD-10-CM | POA: Diagnosis not present

## 2019-03-12 NOTE — Progress Notes (Signed)
Location:  [facility] Room Number: 26A Place of Service:  SNF [phone]) Provider: Lisandra Mathisen FNP-C  Virgie Dad, MD  Patient Care Team: Virgie Dad, MD as PCP - General (Internal Medicine) Milus Banister, MD as Consulting Physician (Gastroenterology) Heath Lark, MD as Consulting Physician (Hematology and Oncology) Mast, Man X, NP as Nurse Practitioner (Internal Medicine)  Extended Emergency Contact Information Primary Emergency Contact: [facility] Address: [place], [place] [address] [name] of [place] Phone: [phone] Mobile Phone: [phone] Relation: Son Secondary Emergency Contact: [name],[name] Address: [address] [place] [address] [place] Phone: [phone] Relation: Daughter  Code Status:  DNR Goals of care: Advanced Directive information Advanced Directives 03/10/2019  Does Patient Have a Medical Advance Directive? Yes  Type of Paramedic of [place];Out of facility DNR (pink MOST or yellow form)  Does patient want to make changes to medical advance directive? No - Patient declined  Copy of [place] in Chart? Yes - validated most recent copy scanned in chart (See row information)  Would patient like information on creating a medical advance directive? -  Pre-existing out of facility DNR order (yellow form or pink MOST form) Yellow form placed in chart (order not valid for inpatient use)     Chief Complaint  Patient presents with   Acute Visit    Removal of staples from Right Lateral Knee    HPI:  Pt is a 85 y.o. female seen today at [facility] for an acute visit for removal right lateral knee staples.she is seen in her room today with facility Nurse present at bedside.she denies any acute issues during visit.she state right knee pain under control with current pain medication.Nurse reports no new  concerns.she has right leg immoblizer in place.Her HPI limited due to cognitive impairment and falls asleep during visit.Patient had an appointment with [facility] for staple removal but patient POA request staples to be removed by provider in the facility due COVID-19  Would like to avoid travel outside the facility.Dr.Olin at [facility] was notified by facility Nurse and order given to remove staples and monitor incision site.  Patient has had a 10.6 lbs over one month.she is currently on boost one can twice daily and Remeron 7.5 mg tablet daily.     Past Medical History:  Diagnosis Date   Blood transfusion 2006   Colon polyps    CVA (cerebral infarction) 2013   Dementia [facility])    vascular dementia    Diverticulosis of colon    DJD (degenerative joint disease)    Hyperlipidemia    Hypertension    Hypothyroidism    Melanoma ([place]) 03/20/15   removed with Moh's surgery   Myelodysplasia 02/04/2012   Normochromic normocytic anemia 02/04/2012   Onychomycosis    Osteoporosis    Oxygen dependent    2l/[place] continuous    Rectal prolapse    Shoulder pain    Unstable gait    Past Surgical History:  Procedure Laterality Date   APPENDECTOMY     CARPAL TUNNEL RELEASE Bilateral 2006   FEMUR IM NAIL Right 11/09/2018   Procedure: INTRAMEDULLARY (IM) RETROGRADE FEMORAL NAILING;  Surgeon: Paralee Cancel, MD;  Location: [facility];  Service: Orthopedics;  Laterality: Right;   FLEXIBLE SIGMOIDOSCOPY  07/09/2012   Procedure: FLEXIBLE SIGMOIDOSCOPY;  Surgeon:  Inda Castle, MD;  Location: [facility];  Service: Endoscopy;  Laterality: N/A;   INCISION AND DRAINAGE PERIRECTAL ABSCESS N/A 09/21/2018   Procedure: IRRIGATION AND DEBRIDEMENT PERIRECTAL ABSCESS;  Surgeon: Rolm Bookbinder, MD;  Location: [facility];  Service: General;  Laterality: N/A;   MELANOMA EXCISION Right 2005   arm   ORIF FEMUR FRACTURE Right 02/22/2019   Procedure: REVISION OPEN REDUCTION INTERNAL FIXATION (ORIF)  DISTAL FEMUR FRACTURE;  Surgeon: Paralee Cancel, MD;  Location: [facility];  Service: Orthopedics;  Laterality: Right;  90 mins Alvan Dame can start at 3 or 3:30 if time opens up.   POLYPECTOMY  2004   Laparoscopic   RECTOCELE REPAIR     REPLACEMENT TOTAL KNEE  2005 & 2006   VAGINAL HYSTERECTOMY  1989    Allergies  Allergen Reactions   Lisinopril Swelling   Penicillins Rash    Has patient had a PCN reaction causing immediate rash, facial/tongue/throat swelling, SOB or lightheadedness with hypotension: Unknown Has patient had a PCN reaction causing severe rash involving mucus membranes or skin necrosis: Unknown Has patient had a PCN reaction that required hospitalization: Unknown Has patient had a PCN reaction occurring within the last 10 years: Unknown If all of the above answers are "NO", then may proceed with Cephalosporin use.    Outpatient Encounter Medications as of 03/10/2019  Medication Sig   aspirin EC 325 MG EC tablet Take 1 tablet (325 mg total) by mouth 2 (two) times daily for 28 days. Then resume normal dose.   aspirin EC 81 MG tablet Take 81 mg by mouth daily.   cycloSPORINE (RESTASIS) 0.05 % ophthalmic emulsion Place 1 drop into both eyes 2 (two) times daily.     docusate sodium (COLACE) 100 MG capsule Take 1 capsule (100 mg total) by mouth 2 (two) times daily.   donepezil (ARICEPT) 5 MG tablet Take 5 mg by mouth at bedtime.   ferrous sulfate 325 (65 FE) MG tablet Take 1 tablet (325 mg total) by mouth 3 (three) times daily after meals.   HYDROcodone-acetaminophen (NORCO) 7.5-325 MG tablet Take 1-2 tablets by mouth every 4 (four) hours as needed for severe pain (pain score 7-10).   hydroxypropyl methylcellulose / hypromellose (ISOPTO TEARS / GONIOVISC) 2.5 % ophthalmic solution Place 1 drop into both eyes every 2 (two) hours as needed for dry eyes.   lactose free nutrition (BOOST) LIQD Take 237 mLs by mouth 2 (two) times daily.   levothyroxine (SYNTHROID, LEVOTHROID)  88 MCG tablet Take 1 tablet (88 mcg total) by mouth daily before breakfast.   Metoprolol Tartrate 37.5 MG TABS Take 37.5 mg by mouth 2 (two) times daily.   mirtazapine (REMERON) 15 MG tablet Take 7.5 mg by mouth at bedtime.    ondansetron (ZOFRAN) 4 MG tablet Take 1 tablet (4 mg total) by mouth every 6 (six) hours as needed for nausea.   OXYGEN Inhale 2 L into the lungs continuous. To keep sat above 90%   polyethylene glycol powder (GLYCOLAX/MIRALAX) powder Take 17 g by mouth daily as needed for mild constipation.    PRESCRIPTION MEDICATION Take 1 application by mouth daily. Magic Cup at dinner   Sodium Fluoride (PREVIDENT 5000 BOOSTER PLUS) 1.1 % PSTE Place 1 application onto teeth every morning. Brush on to teeth as directed   ZINC OXIDE PO Take 1 application by mouth See admin instructions. After every incontinent episode and as needed   [DISCONTINUED] donepezil (ARICEPT) 10 MG tablet Take 10 mg by mouth at bedtime.   [  DISCONTINUED] feeding supplement (BOOST / RESOURCE BREEZE) LIQD Take 90 mLs by mouth 3 (three) times daily between meals.   No facility-administered encounter medications on file as of 03/10/2019.     Review of Systems  Unable to perform ROS: Dementia (additional information provided by facility Nurse )    Immunization History  Administered Date(s) Administered   Influenza Split 10/17/2011, 09/14/2012   Influenza Whole 10/17/2007   Influenza, High Dose Seasonal PF 10/11/2013, 10/05/2014   Influenza,inj,Quad PF,6+ Mos 10/05/2018   Influenza,inj,quad, With Preservative 09/15/2018   Influenza-Unspecified 09/05/2015, 10/02/2016, 10/06/2017   PPD Test 01/12/2015   Pneumococcal Conjugate-13 09/29/2014   Pneumococcal Polysaccharide-23 12/16/1998   Td 11/10/2018   Tdap 04/23/2011   Zoster 07/04/2006   Pertinent  Health Maintenance Due  Topic Date Due   OPHTHALMOLOGY EXAM  11/15/2014   FOOT EXAM  11/30/2014   URINE MICROALBUMIN  11/30/2014    HEMOGLOBIN A1C  10/11/2015   INFLUENZA VACCINE  Completed   DEXA SCAN  Completed   PNA vac Low Risk Adult  Completed   Fall Risk  10/06/2018 10/05/2018 09/02/2017 07/04/2016 01/04/2016  Falls in the past year? No No No No No    Vitals:   03/10/19 1220  BP: 130/90  Pulse: 88  Resp: 16  Temp: (!) 97.4 F (36.3 C)  TempSrc: Oral  SpO2: 92%  Weight: 110 lb (49.9 kg)  Height: 4\' 11"  (1.499 m)   Body mass index is 22.22 kg/m. Physical Exam Vitals signs and nursing note reviewed.  Constitutional:      General: She is not in acute distress.    Appearance: She is normal weight. She is not ill-appearing.  HENT:     Head: Normocephalic.     Mouth/Throat:     Mouth: Mucous membranes are moist.     Pharynx: Oropharynx is clear. No oropharyngeal exudate or posterior oropharyngeal erythema.  Eyes:     General: No scleral icterus.       Right eye: No discharge.        Left eye: No discharge.     Conjunctiva/sclera: Conjunctivae normal.     Pupils: Pupils are equal, round, and reactive to light.  Cardiovascular:     Rate and Rhythm: Normal rate and regular rhythm.     Pulses: Normal pulses.     Heart sounds: Murmur present. No friction rub. No gallop.   Pulmonary:     Effort: Pulmonary effort is normal. No respiratory distress.     Breath sounds: Normal breath sounds. No wheezing, rhonchi or rales.  Chest:     Chest wall: No tenderness.  Abdominal:     General: Bowel sounds are normal. There is no distension.     Palpations: Abdomen is soft. There is no mass.     Tenderness: There is no abdominal tenderness. There is no right CVA tenderness, left CVA tenderness, guarding or rebound.  Musculoskeletal:        General: No tenderness.     Right lower leg: No edema.     Left lower leg: No edema.     Comments: Moves x 4 extremities requires assistance with transfers.right leg immobilizer in place.   Skin:    General: Skin is warm and dry.     Coloration: Skin is not pale.      Findings: No erythema or rash.     Comments: Right lateral knee surgical incision dry,clean and intact long incision with 30 staples and femur area with 5 staples.surrounding skin tissues  without any signs of infections.incision site cleansed with alcohol swab and betadine.35 total staples removed using a staple remover without any difficulties.patient tolerated procedure well.incision well healed and intact.incision site covered with ABD pad and secured with tape.     Neurological:     Mental Status: She is alert. Mental status is at baseline.  Psychiatric:        Mood and Affect: Mood normal.        Behavior: Behavior normal.        Thought Content: Thought content normal.        Judgment: Judgment normal.     Labs reviewed: Recent Labs    09/24/18 0330 09/25/18 0539  11/11/18 0503  02/22/19 1504 02/23/19 0336 02/24/19 0539  NA 142 141   < > 142   < > 141 139 138  K 4.7 4.6   < > 5.1   < > 4.4 5.3* 4.3  CL 109 105   < > 111   < > 110 110 109  CO2 28 30   < > 21*   < > 23 22 23   GLUCOSE 101* 89   < > 115*   < > 100* 138* 110*  BUN 19 14   < > 42*   < > 36* 32* 24*  CREATININE 0.87 0.86   < > 1.05*   < > 1.24* 1.05* 0.91  CALCIUM 8.7* 8.8*   < > 8.8*   < > 9.5 9.2 9.0  MG 1.5* 1.7  --  1.7  --   --   --   --    < > = values in this interval not displayed.   Recent Labs    09/17/18 0445 09/20/18 0706 09/21/18 0645  AST 43* 17 20  ALT 61* 31 25  ALKPHOS 97 72 64  BILITOT 1.0 1.1 0.6  PROT 5.2* 4.9* 5.2*  ALBUMIN 2.3* 2.2* 2.2*   Recent Labs    11/09/18 1424  11/19/18 1006 12/17/18 1058 02/22/19 1504 02/23/19 0336 02/24/19 0539 02/25/19  WBC 12.5*   < > 7.3 9.0 6.0 8.0 7.2 8.1  NEUTROABS 8.5*  --  4.1 6.0  --   --   --   --   HGB 10.3*   < > 9.7* 11.6* 10.3* 8.2* 7.5* 8.2*  HCT 34.1*   < > 30.9* 37.9 34.7* 27.2* 24.6* 25*  MCV 91.2   < > 92.8 88.3 89.7 88.9 89.1  --   PLT 283   < > 290 278 281 235 234 295   < > = values in this interval not displayed.   Lab  Results  Component Value Date   TSH 1.88 08/11/2018   Lab Results  Component Value Date   HGBA1C 5.6 04/11/2015   Lab Results  Component Value Date   CHOL 147 04/22/2017   HDL 47 04/22/2017   LDLCALC 77 04/22/2017   LDLDIRECT 131.2 11/30/2013   TRIG 125 04/22/2017   CHOLHDL 4 11/30/2013    Significant Diagnostic Results in last 30 days:  Dg Knee 1-2 Views Right  Result Date: 02/22/2019 CLINICAL DATA:  85 year old female with right femur fracture. Subsequent encounter. EXAM: DG C-ARM 61-120 MIN; RIGHT KNEE - 1-2 VIEW Fluoroscopic time: 32 seconds. COMPARISON:  11/09/2018. FINDINGS: Four intraoperative C-arm views submitted for review after surgery. Right femoral long plate/screws and cerclage wires utilized to transfix distal femur fracture. Prior right knee replacement. IMPRESSION: Open reduction and internal fixation right femur fracture. Electronically Signed  By: Genia Del M.D.   On: 02/22/2019 18:18   Pelvis Portable  Result Date: 02/22/2019 CLINICAL DATA:  Postoperative revision open reduction internal fixation distal femoral fracture. EXAM: PORTABLE PELVIS 1-2 VIEWS COMPARISON:  Fluoroscopy 02/22/2019 FINDINGS: Rotated patient limits evaluation. The proximal portion of the fixation plate and screws in the right femur is included within the field of view and appears grossly intact. The entire postoperative region is not included. Skin clips consistent with recent surgery. Pelvis appears intact. SI joints and symphysis pubis are not displaced. Surgical clips in the right pelvis. Degenerative changes in the lower lumbar spine and hips. IMPRESSION: Only the proximal portion of the fixation plate and screws in the right femur is included within the field of view. Visualized portions appear grossly intact. Electronically Signed   By: Lucienne Capers M.D.   On: 02/22/2019 19:43   Dg C-arm 1-60 Min  Result Date: 02/22/2019 CLINICAL DATA:  85 year old female with right femur fracture.  Subsequent encounter. EXAM: DG C-ARM 61-120 MIN; RIGHT KNEE - 1-2 VIEW Fluoroscopic time: 32 seconds. COMPARISON:  11/09/2018. FINDINGS: Four intraoperative C-arm views submitted for review after surgery. Right femoral long plate/screws and cerclage wires utilized to transfix distal femur fracture. Prior right knee replacement. IMPRESSION: Open reduction and internal fixation right femur fracture. Electronically Signed   By: Genia Del M.D.   On: 02/22/2019 18:18    Assessment/Plan 1. Weight loss  Has had a 10.6 lbs over one month. - continue on boost one can twice daily - Increase Remeron from 7.5 mg to 15 mg tablet  tablet daily at bedtime for appetite stimulant.  - monitor weight weekly x 1 month.   2. Encounter for removal of staples Afebrile.Incision site without any signs or symptoms of infections.surgical incision well healed.35 total staples removed using a staple remover without any difficulties.patient tolerated procedure well.incision well healed and intact.incision site covered with ABD pad and secured with tape.Facility Nurse to cleanse incision with saline,pat dry and cover with ABD pad change dressing daily.monitor site for any redness,swelling or drainage.Follow up with Dr.Olin 04/14/19 at 10: 15 am as directed.   Family/ staff Communication: Reviewed plan of care with patient and facility Nurse.  Labs/tests ordered: None   Kandiss Ihrig C Hadiyah Maricle, NP

## 2019-03-18 ENCOUNTER — Encounter: Payer: Self-pay | Admitting: Family

## 2019-03-18 ENCOUNTER — Non-Acute Institutional Stay (SKILLED_NURSING_FACILITY): Payer: Medicare Other | Admitting: Family

## 2019-03-18 DIAGNOSIS — N183 Chronic kidney disease, stage 3 unspecified: Secondary | ICD-10-CM

## 2019-03-18 DIAGNOSIS — H01002 Unspecified blepharitis right lower eyelid: Secondary | ICD-10-CM

## 2019-03-18 DIAGNOSIS — I10 Essential (primary) hypertension: Secondary | ICD-10-CM | POA: Diagnosis not present

## 2019-03-18 DIAGNOSIS — D509 Iron deficiency anemia, unspecified: Secondary | ICD-10-CM

## 2019-03-18 DIAGNOSIS — I5032 Chronic diastolic (congestive) heart failure: Secondary | ICD-10-CM

## 2019-03-18 DIAGNOSIS — Z8781 Personal history of (healed) traumatic fracture: Secondary | ICD-10-CM

## 2019-03-18 DIAGNOSIS — F0150 Vascular dementia without behavioral disturbance: Secondary | ICD-10-CM

## 2019-03-18 DIAGNOSIS — E039 Hypothyroidism, unspecified: Secondary | ICD-10-CM

## 2019-03-18 DIAGNOSIS — Z9889 Other specified postprocedural states: Secondary | ICD-10-CM

## 2019-03-18 LAB — CBC AND DIFFERENTIAL
HCT: 29 — AB (ref 36–46)
Hemoglobin: 9.5 — AB (ref 12.0–16.0)
Platelets: 400 — AB (ref 150–399)
WBC: 7.3

## 2019-03-18 MED ORDER — ERYTHROMYCIN 5 MG/GM OP OINT: 1.0000 "application " | TOPICAL_OINTMENT | Freq: Three times a day (TID) | OPHTHALMIC | 0 refills | Status: AC

## 2019-03-18 NOTE — Progress Notes (Signed)
Location:  [facility] Room Number: 26A Place of Service:  SNF [phone]) Provider:   FNP-C   Virgie Dad, MD  Patient Care Team: Virgie Dad, MD as PCP - General (Internal Medicine) Milus Banister, MD as Consulting Physician (Gastroenterology) Heath Lark, MD as Consulting Physician (Hematology and Oncology) Mast, Man X, NP as Nurse Practitioner (Internal Medicine)  Extended Emergency Contact Information Primary Emergency Contact: [facility] Address: [place], [place] [address] [name] of [place] Phone: [phone] Mobile Phone: [phone] Relation: Son Secondary Emergency Contact: [name],[name] Address: [address] [place] [address] [place] Phone: [phone] Relation: Daughter  Code Status:  DNR  Goals of care: Advanced Directive information Advanced Directives 03/18/2019  Does Patient Have a Medical Advance Directive? Yes  Type of Paramedic of [place];Out of facility DNR (pink MOST or yellow form)  Does patient want to make changes to medical advance directive? No - Guardian declined  Copy of [place] in Chart? Yes - validated most recent copy scanned in chart (See row information)  Would patient like information on creating a medical advance directive? -  Pre-existing out of facility DNR order (yellow form or pink MOST form) Yellow form placed in chart (order not valid for inpatient use)     Chief Complaint  Patient presents with  . Medical Management of Chronic Issues    Routine Visit    HPI:  Pt is a 85 y.o. female seen today [place] for medical management of chronic diseases.she has a medical history of HTN,chronic diastolic CHF,CKD stage 3,Anmeia,Hypothyroidism,Dementia without behavioral disturbance among other conditions.she is seen in her room today.she is more alert this visit and answers some  questions appropriately though states waiting for a bus to take to the hospital for leg surgery.Attempted to re-orient without any success.she is status post right distal femur fracture ORIF staples removed 03/10/2019 incision completely healed without any signs of infection.she has a right leg immobilizer in place.she will follow up with Dr.Olin at [facility] when COVID-19 restrictions is over.she has had weight loss Remeron recent increased to 15 mg tablet and her Resource also increased by Dietician to 120 cc three times daily. Facility Nurse reports no new concerns.         Past Medical History:  Diagnosis Date  . Blood transfusion 2006  . Colon polyps   . CVA (cerebral infarction) 2013  . Dementia ([place])    vascular dementia   . Diverticulosis of colon   . DJD (degenerative joint disease)   . Hyperlipidemia   . Hypertension   . Hypothyroidism   . Melanoma ([place]) 03/20/15   removed with Moh's surgery  . Myelodysplasia 02/04/2012  . Normochromic normocytic anemia 02/04/2012  . Onychomycosis   . Osteoporosis   . Oxygen dependent    2l/[place] continuous   . Rectal prolapse   . Shoulder pain   . Unstable gait    Past Surgical History:  Procedure Laterality Date  . APPENDECTOMY    . CARPAL TUNNEL RELEASE Bilateral 2006  . FEMUR IM NAIL Right 11/09/2018   Procedure: INTRAMEDULLARY (IM) RETROGRADE FEMORAL NAILING;  Surgeon: Paralee Cancel, MD;  Location: [facility];  Service: Orthopedics;  Laterality: Right;  . FLEXIBLE SIGMOIDOSCOPY  07/09/2012   Procedure: FLEXIBLE SIGMOIDOSCOPY;  Surgeon: Inda Castle, MD;  Location: [facility];  Service: Endoscopy;  Laterality: N/A;  . INCISION AND DRAINAGE PERIRECTAL ABSCESS N/A 09/21/2018   Procedure: IRRIGATION AND DEBRIDEMENT PERIRECTAL ABSCESS;  Surgeon: Rolm Bookbinder, MD;  Location: [facility];  Service: General;  Laterality: N/A;  . MELANOMA EXCISION Right 2005   arm  . ORIF FEMUR FRACTURE Right 02/22/2019   Procedure: REVISION OPEN REDUCTION  INTERNAL FIXATION (ORIF) DISTAL FEMUR FRACTURE;  Surgeon: Paralee Cancel, MD;  Location: [facility];  Service: Orthopedics;  Laterality: Right;  90 mins Alvan Dame can start at 3 or 3:30 if time opens up.  Marland Kitchen POLYPECTOMY  2004   Laparoscopic  . RECTOCELE REPAIR    . REPLACEMENT TOTAL KNEE  2005 & 2006  . VAGINAL HYSTERECTOMY  1989    Allergies  Allergen Reactions  . Lisinopril Swelling  . Penicillins Rash    Has patient had a PCN reaction causing immediate rash, facial/tongue/throat swelling, SOB or lightheadedness with hypotension: Unknown Has patient had a PCN reaction causing severe rash involving mucus membranes or skin necrosis: Unknown Has patient had a PCN reaction that required hospitalization: Unknown Has patient had a PCN reaction occurring within the last 10 years: Unknown If all of the above answers are "NO", then may proceed with Cephalosporin use.    Allergies as of 03/18/2019      Reactions   Lisinopril Swelling   Penicillins Rash   Has patient had a PCN reaction causing immediate rash, facial/tongue/throat swelling, SOB or lightheadedness with hypotension: Unknown Has patient had a PCN reaction causing severe rash involving mucus membranes or skin necrosis: Unknown Has patient had a PCN reaction that required hospitalization: Unknown Has patient had a PCN reaction occurring within the last 10 years: Unknown If all of the above answers are "NO", then may proceed with Cephalosporin use.      Medication List       Accurate as of March 18, 2019  6:05 PM. Always use your most recent med list.        aspirin 325 MG EC tablet Take 1 tablet (325 mg total) by mouth 2 (two) times daily for 28 days. Then resume normal dose.   cycloSPORINE 0.05 % ophthalmic emulsion Commonly known as:  RESTASIS Place 1 drop into both eyes 2 (two) times daily.   docusate sodium 100 MG capsule Commonly known as:  COLACE Take 1 capsule (100 mg total) by mouth 2 (two) times daily.   donepezil 5 MG  tablet Commonly known as:  ARICEPT Take 5 mg by mouth at bedtime.   erythromycin ophthalmic ointment Place 1 application into the right eye 3 (three) times daily for 7 days.   ferrous sulfate 325 (65 FE) MG tablet Take 1 tablet (325 mg total) by mouth 3 (three) times daily after meals.   HYDROcodone-acetaminophen 7.5-325 MG tablet Commonly known as:  NORCO Take 1-2 tablets by mouth every 4 (four) hours as needed for severe pain (pain score 7-10).   hydroxypropyl methylcellulose / hypromellose 2.5 % ophthalmic solution Commonly known as:  ISOPTO TEARS / GONIOVISC Place 1 drop into both eyes every 2 (two) hours as needed for dry eyes.   lactose free nutrition Liqd Take 237 mLs by mouth 2 (two) times daily.   levothyroxine 88 MCG tablet Commonly known as:  SYNTHROID, LEVOTHROID Take 1 tablet (88 mcg total) by mouth daily before breakfast.   Metoprolol Tartrate 37.5 MG Tabs Take 37.5 mg by mouth 2 (two) times daily.   mirtazapine 15 MG tablet Commonly known as:  REMERON Take 15 mg by mouth at bedtime.   ondansetron 4 MG tablet Commonly known as:  ZOFRAN Take 1 tablet (4 mg total) by mouth every 6 (six) hours as needed for nausea.   OXYGEN Inhale 2 L into the lungs continuous. To keep sat above 90%   polyethylene glycol powder powder Commonly known as:  GLYCOLAX/MIRALAX Take 17 g by mouth daily as needed for mild constipation.   PRESCRIPTION MEDICATION Take 1 application by mouth daily. Magic Cup at dinner   PreviDent 5000 Booster Plus 1.1 % Pste Generic drug:  Sodium Fluoride Place 1 application onto teeth every morning. Brush on to teeth as directed   ZINC OXIDE PO Take 1 application by mouth See admin instructions. After every incontinent episode and as needed       Review of Systems  Unable to perform ROS: Dementia (additional information provided by facility Nurse )    Immunization History  Administered Date(s) Administered  . Influenza Split 10/17/2011,  09/14/2012  . Influenza Whole 10/17/2007  . Influenza, High Dose Seasonal PF 10/11/2013, 10/05/2014  . Influenza,inj,Quad PF,6+ Mos 10/05/2018  . Influenza,inj,quad, With Preservative 09/15/2018  . Influenza-Unspecified 09/05/2015, 10/02/2016, 10/06/2017  . PPD Test 01/12/2015  . Pneumococcal Conjugate-13 09/29/2014  . Pneumococcal Polysaccharide-23 12/16/1998  . Td 11/10/2018  . Tdap 04/23/2011  . Zoster 07/04/2006   Pertinent  Health Maintenance Due  Topic Date Due  . OPHTHALMOLOGY EXAM  11/15/2014  . FOOT EXAM  11/30/2014  . URINE MICROALBUMIN  11/30/2014  . HEMOGLOBIN A1C  10/11/2015  . INFLUENZA VACCINE  07/17/2019  . DEXA SCAN  Completed  . PNA vac Low Risk Adult  Completed   Fall Risk  10/06/2018 10/05/2018 09/02/2017 07/04/2016 01/04/2016  Falls in the past year? No No No No No   Functional Status Survey:    Vitals:   03/18/19 1044  BP: 119/71  Pulse: 65  Resp: 16  Temp: 97.6 F (36.4 C)  TempSrc: Oral  SpO2: 93%  Weight: 105 lb 14.4 oz (48 kg)  Height: 4\' 11"  (1.499 m)   Body mass index is 21.39 kg/m. Physical Exam Vitals signs and nursing note reviewed.  Constitutional:      General: She is not in acute distress.    Appearance: She is normal weight. She is not ill-appearing.  HENT:     Head: Normocephalic.     Right Ear: Tympanic membrane, ear canal and external ear normal. There is no impacted cerumen.     Left Ear: Tympanic membrane, ear canal and external ear normal. There is no impacted cerumen.     Nose: Nose normal. No congestion or rhinorrhea.     Mouth/Throat:     Mouth: Mucous membranes are moist.     Pharynx: Oropharynx is clear. No oropharyngeal exudate or posterior oropharyngeal erythema.  Eyes:     General: No scleral icterus.       Left eye: Discharge present.    Conjunctiva/sclera: Conjunctivae normal.     Pupils: Pupils are equal, round, and reactive to light.     Comments: Right lower eyelid red and outwardly everted.yellow crust ed  drainage noted on eyelashes.   Neck:     Musculoskeletal: Normal range of motion. No neck rigidity or muscular tenderness.     Vascular: No carotid bruit.  Cardiovascular:     Rate and Rhythm: Normal rate and regular rhythm.     Pulses: Normal pulses.     Heart sounds: Normal heart sounds. No murmur. No  friction rub. No gallop.   Pulmonary:     Effort: Pulmonary effort is normal. No respiratory distress.     Breath sounds: Normal breath sounds. No wheezing, rhonchi or rales.  Chest:     Chest wall: No tenderness.  Abdominal:     General: Bowel sounds are normal. There is no distension.     Palpations: Abdomen is soft. There is no mass.     Tenderness: There is no abdominal tenderness. There is no right CVA tenderness, left CVA tenderness, guarding or rebound.  Musculoskeletal:        General: No swelling or tenderness.     Right lower leg: No edema.     Left lower leg: No edema.     Comments: Moves x 4 extremities requires assistance with transfer.  Lymphadenopathy:     Cervical: No cervical adenopathy.  Skin:    General: Skin is warm and dry.     Coloration: Skin is not pale.     Findings: No erythema or rash.     Comments: Right thigh surgical incision completely healed none tender to touch.No signs of infections noted.dressing clean and dry.   Neurological:     Mental Status: She is alert. Mental status is at baseline.     Cranial Nerves: No cranial nerve deficit.     Sensory: No sensory deficit.     Coordination: Coordination normal.  Psychiatric:        Mood and Affect: Mood normal.        Speech: Speech normal.        Behavior: Behavior normal.        Thought Content: Thought content normal.        Cognition and Memory: Memory is impaired.        Judgment: Judgment normal.     Labs reviewed: Recent Labs    09/24/18 0330 09/25/18 0539  11/11/18 0503  02/22/19 1504 02/23/19 0336 02/24/19 0539 03/04/19  NA 142 141   < > 142   < > 141 139 138 142  K 4.7 4.6   <  > 5.1   < > 4.4 5.3* 4.3 4.5  CL 109 105   < > 111   < > 110 110 109  --   CO2 28 30   < > 21*   < > 23 22 23   --   GLUCOSE 101* 89   < > 115*   < > 100* 138* 110*  --   BUN 19 14   < > 42*   < > 36* 32* 24* 21  CREATININE 0.87 0.86   < > 1.05*   < > 1.24* 1.05* 0.91 0.8  CALCIUM 8.7* 8.8*   < > 8.8*   < > 9.5 9.2 9.0  --   MG 1.5* 1.7  --  1.7  --   --   --   --   --    < > = values in this interval not displayed.   Recent Labs    09/17/18 0445 09/20/18 0706 09/21/18 0645 02/05/19  AST 43* 17 20 12*  ALT 61* 31 25 8   ALKPHOS 97 72 64 132*  BILITOT 1.0 1.1 0.6  --   PROT 5.2* 4.9* 5.2*  --   ALBUMIN 2.3* 2.2* 2.2*  --    Recent Labs    11/09/18 1424  11/19/18 1006 12/17/18 1058  02/22/19 1504 02/23/19 0336 02/24/19 0539 02/25/19 03/04/19  WBC 12.5*   < >  7.3 9.0   < > 6.0 8.0 7.2 8.1 3.6  NEUTROABS 8.5*  --  4.1 6.0  --   --   --   --   --   --   HGB 10.3*   < > 9.7* 11.6*   < > 10.3* 8.2* 7.5* 8.2* 7.9*  HCT 34.1*   < > 30.9* 37.9   < > 34.7* 27.2* 24.6* 25* 25*  MCV 91.2   < > 92.8 88.3  --  89.7 88.9 89.1  --   --   PLT 283   < > 290 278   < > 281 235 234 295 559*   < > = values in this interval not displayed.   Lab Results  Component Value Date   TSH 2.31 03/04/2019   Lab Results  Component Value Date   HGBA1C 5.6 04/11/2015   Lab Results  Component Value Date   CHOL 147 04/22/2017   HDL 47 04/22/2017   LDLCALC 77 04/22/2017   LDLDIRECT 131.2 11/30/2013   TRIG 125 04/22/2017   CHOLHDL 4 11/30/2013    Significant Diagnostic Results in last 30 days:  Dg Knee 1-2 Views Right  Result Date: 02/22/2019 CLINICAL DATA:  85 year old female with right femur fracture. Subsequent encounter. EXAM: DG C-ARM 61-120 MIN; RIGHT KNEE - 1-2 VIEW Fluoroscopic time: 32 seconds. COMPARISON:  11/09/2018. FINDINGS: Four intraoperative C-arm views submitted for review after surgery. Right femoral long plate/screws and cerclage wires utilized to transfix distal femur fracture.  Prior right knee replacement. IMPRESSION: Open reduction and internal fixation right femur fracture. Electronically Signed   By: Genia Del M.D.   On: 02/22/2019 18:18   Pelvis Portable  Result Date: 02/22/2019 CLINICAL DATA:  Postoperative revision open reduction internal fixation distal femoral fracture. EXAM: PORTABLE PELVIS 1-2 VIEWS COMPARISON:  Fluoroscopy 02/22/2019 FINDINGS: Rotated patient limits evaluation. The proximal portion of the fixation plate and screws in the right femur is included within the field of view and appears grossly intact. The entire postoperative region is not included. Skin clips consistent with recent surgery. Pelvis appears intact. SI joints and symphysis pubis are not displaced. Surgical clips in the right pelvis. Degenerative changes in the lower lumbar spine and hips. IMPRESSION: Only the proximal portion of the fixation plate and screws in the right femur is included within the field of view. Visualized portions appear grossly intact. Electronically Signed   By: Lucienne Capers M.D.   On: 02/22/2019 19:43   Dg C-arm 1-60 Min  Result Date: 02/22/2019 CLINICAL DATA:  85 year old female with right femur fracture. Subsequent encounter. EXAM: DG C-ARM 61-120 MIN; RIGHT KNEE - 1-2 VIEW Fluoroscopic time: 32 seconds. COMPARISON:  11/09/2018. FINDINGS: Four intraoperative C-arm views submitted for review after surgery. Right femoral long plate/screws and cerclage wires utilized to transfix distal femur fracture. Prior right knee replacement. IMPRESSION: Open reduction and internal fixation right femur fracture. Electronically Signed   By: Genia Del M.D.   On: 02/22/2019 18:18    Assessment/Plan 1. Chronic diastolic congestive heart failure (HCC) No signs of fluid overload.off diuretics.continue on Metoprolol.continue to monitor weight.    2. Essential hypertension B/p stable.continue on metoprolol 37.5 mg tablet twice daily.on ASA for prophylaxis.  3. CKD  (chronic kidney disease) stage 3, GFR 30-59 ml/min (HCC) CR at baseline.continue to monitor BMP.   4. Iron deficiency anemia, unspecified iron deficiency anemia type Latest Hgb 7.9 (03/04/2019) CBC    Component Value Date/Time   WBC 3.6 03/04/2019  WBC 7.2 02/24/2019 0539   RBC 2.76 (L) 02/24/2019 0539   HGB 7.9 (A) 03/04/2019   HGB 8.5 (L) 10/22/2018 1016   HGB 8.3 08/24/2018   HCT 25 (A) 03/04/2019   HCT 25 08/24/2018   HCT 31.1 (L) 08/06/2016 1058   PLT 559 (A) 03/04/2019   PLT 489 (H) 10/22/2018 1016   PLT 368 08/06/2016 1058   MCV 89.1 02/24/2019 0539   MCV 80.6 08/24/2018   MCV 85.7 08/06/2016 1058   MCH 27.2 02/24/2019 0539   MCHC 30.5 02/24/2019 0539   RDW 17.6 (H) 02/24/2019 0539   RDW 17.1 (H) 08/06/2016 1058   LYMPHSABS 1.9 12/17/2018 1058   LYMPHSABS 1.7 08/06/2016 1058   MONOABS 0.8 12/17/2018 1058   MONOABS 0.6 08/06/2016 1058   EOSABS 0.1 12/17/2018 1058   EOSABS 0.2 08/06/2016 1058   BASOSABS 0.0 12/17/2018 1058   BASOSABS 0.0 08/06/2016 1058   CBC drawn prior to visit results pending.continue on ferrous sulfate 325 mg tablet three times daily.   5. Hypothyroidism, unspecified type Lab Results  Component Value Date   TSH 2.31 03/04/2019  Continue on Levothyroxine 88 mcg tablet daily.monitor TSH level.   6. S/P ORIF right distal femur fracture Right thigh incision healed without any signs of infections.pain under control with current regimen.continue to monitor.follow up with Ortho as directed.   7. Vascular dementia without behavioral disturbance (HCC) Cognition at baseline.No behavioral issues reported.continue with supportive care.  8. Blepharitis  Right eye lid outwardly everted,red with yellow crusty drainage noted.Apply Erythromycin ophthalmic ointment 1 cm ribbon to lower eyelid every 8 hours x 7 days.continue to monitor.    Family/ staff Communication: Reviewed plan of care with patient and facility Nurse.  Labs/tests ordered: None     C , NP

## 2019-04-01 ENCOUNTER — Encounter: Payer: Self-pay | Admitting: Hematology and Oncology

## 2019-04-11 LAB — BASIC METABOLIC PANEL
BUN: 17 (ref 4–21)
Creatinine: 1.9 — AB (ref 0.5–1.1)
Glucose: 131
Potassium: 5.4 — AB (ref 3.4–5.3)
Sodium: 139 (ref 137–147)

## 2019-04-11 LAB — CBC AND DIFFERENTIAL
HCT: 27 — AB (ref 36–46)
Hemoglobin: 8.8 — AB (ref 12.0–16.0)
Platelets: 455 — AB (ref 150–399)
WBC: 26.8

## 2019-04-12 ENCOUNTER — Non-Acute Institutional Stay (SKILLED_NURSING_FACILITY): Payer: Medicare Other | Admitting: Family

## 2019-04-12 ENCOUNTER — Encounter: Payer: Self-pay | Admitting: Family

## 2019-04-12 DIAGNOSIS — D72829 Elevated white blood cell count, unspecified: Secondary | ICD-10-CM | POA: Diagnosis not present

## 2019-04-12 DIAGNOSIS — R638 Other symptoms and signs concerning food and fluid intake: Secondary | ICD-10-CM | POA: Diagnosis not present

## 2019-04-12 NOTE — Progress Notes (Signed)
Location:  [facility] Room Number: 26A Place of Service:  SNF [phone]) Provider:   FNP-C  Virgie Dad, MD  Patient Care Team: Virgie Dad, MD as PCP - General (Internal Medicine) Milus Banister, MD as Consulting Physician (Gastroenterology) Heath Lark, MD as Consulting Physician (Hematology and Oncology) Mast, Man X, NP as Nurse Practitioner (Internal Medicine)  Extended Emergency Contact Information Primary Emergency Contact: [facility] Address: [place], [place] [address] [name] of [place] Phone: [phone] Mobile Phone: [phone] Relation: Son Secondary Emergency Contact: [name],[name] Address: [address] [place] [address] [place] Phone: [phone] Relation: Daughter  Code Status:  DNR Goals of care: Advanced Directive information Advanced Directives 04/12/2019  Does Patient Have a Medical Advance Directive? Yes  Type of Paramedic of [place];Out of facility DNR (pink MOST or yellow form)  Does patient want to make changes to medical advance directive? No - Patient declined  Copy of [place] in Chart? Yes - validated most recent copy scanned in chart (See row information)  Would patient like information on creating a medical advance directive? -  Pre-existing out of facility DNR order (yellow form or pink MOST form) Yellow form placed in chart (order not valid for inpatient use)     Chief Complaint  Patient presents with  . Acute Visit    Abnormal labs    HPI:  Pt is a 85 y.o. female seen today at [facility] for an acute visit for follow up of abnormal lab results.she is seen in her room today with facility CNA present performing morning care.Facility Nurse reports patient had a temp 102.9 on 04/10/2019 on call provider was notified recommended patient to be send to ED for further evaluation but  patient's POA declined.CBC/diff,BMP and urine specimen for U/A and C/S was ordered by MD results showed  WBC 26.8,Hgb 8.8,CR 1.94,BUN 33 Glucose 131.MD ordered Levaquin 500 mg tablet daily x 7 days.POA also requested hospice referral order was send to hospice per nurse but still awaiting Hospice evaluation.Her Urine analysis and C/S also still pending.Nurse reports today that patient has refused to eat meals and has been combative towards staff at times.No cough,shortness of breath, fever or chills reported this visit.   Past Medical History:  Diagnosis Date  . Blood transfusion 2006  . Colon polyps   . CVA (cerebral infarction) 2013  . Dementia ([place])    vascular dementia   . Diverticulosis of colon   . DJD (degenerative joint disease)   . Hyperlipidemia   . Hypertension   . Hypothyroidism   . Melanoma ([place]) 03/20/15   removed with Moh's surgery  . Myelodysplasia 02/04/2012  . Normochromic normocytic anemia 02/04/2012  . Onychomycosis   . Osteoporosis   . Oxygen dependent    2l/[place] continuous   . Rectal prolapse   . Shoulder pain   . Unstable gait    Past Surgical History:  Procedure Laterality Date  . APPENDECTOMY    . CARPAL TUNNEL RELEASE Bilateral 2006  . FEMUR IM NAIL Right 11/09/2018   Procedure: INTRAMEDULLARY (IM) RETROGRADE FEMORAL NAILING;  Surgeon: Paralee Cancel, MD;  [facility];  Service: Orthopedics;  Laterality: Right;  . FLEXIBLE SIGMOIDOSCOPY  07/09/2012   Procedure: FLEXIBLE SIGMOIDOSCOPY;  Surgeon: Inda Castle, MD;  [facility];  Service:  Endoscopy;  Laterality: N/A;  . INCISION AND DRAINAGE PERIRECTAL ABSCESS N/A 09/21/2018   Procedure: IRRIGATION AND DEBRIDEMENT PERIRECTAL ABSCESS;  Surgeon: Rolm Bookbinder, MD;  Location: [facility];  Service: General;  Laterality: N/A;  . MELANOMA EXCISION Right 2005   arm  . ORIF FEMUR FRACTURE Right 02/22/2019   Procedure: REVISION OPEN REDUCTION INTERNAL FIXATION (ORIF) DISTAL FEMUR FRACTURE;  Surgeon: Paralee Cancel, MD;  [facility];  Service: Orthopedics;  Laterality: Right;  90 mins Alvan Dame can start at 3 or 3:30 if time opens up.  Marland Kitchen POLYPECTOMY  2004   Laparoscopic  . RECTOCELE REPAIR    . REPLACEMENT TOTAL KNEE  2005 & 2006  . VAGINAL HYSTERECTOMY  1989    Allergies  Allergen Reactions  . Lisinopril Swelling  . Penicillins Rash    Has patient had a PCN reaction causing immediate rash, facial/tongue/throat swelling, SOB or lightheadedness with hypotension: Unknown Has patient had a PCN reaction causing severe rash involving mucus membranes or skin necrosis: Unknown Has patient had a PCN reaction that required hospitalization: Unknown Has patient had a PCN reaction occurring within the last 10 years: Unknown If all of the above answers are "NO", then may proceed with Cephalosporin use.    Outpatient Encounter Medications as of 04/12/2019  Medication Sig  . aspirin 81 MG chewable tablet Chew 81 mg by mouth daily.  . cycloSPORINE (RESTASIS) 0.05 % ophthalmic emulsion Place 1 drop into both eyes 2 (two) times daily.    Marland Kitchen docusate sodium (COLACE) 100 MG capsule Take 1 capsule (100 mg total) by mouth 2 (two) times daily.  Marland Kitchen donepezil (ARICEPT) 5 MG tablet Take 5 mg by mouth at bedtime.  . ferrous sulfate 325 (65 FE) MG tablet Take 1 tablet (325 mg total) by mouth 3 (three) times daily after meals.  Marland Kitchen HYDROcodone-acetaminophen (NORCO) 7.5-325 MG tablet Take 1-2 tablets by mouth every 4 (four) hours as needed for severe pain (pain score 7-10).  . hydroxypropyl methylcellulose / hypromellose (ISOPTO TEARS / GONIOVISC) 2.5 % ophthalmic solution Place 1 drop into both eyes every 2 (two) hours as needed for dry eyes.  Marland Kitchen lactose free nutrition (BOOST) LIQD Take 237 mLs by mouth 2 (two) times daily.  Marland Kitchen levothyroxine (SYNTHROID, LEVOTHROID) 88 MCG tablet Take 1 tablet (88 mcg total) by mouth daily before breakfast.  . Metoprolol Tartrate 37.5 MG TABS Take 37.5 mg by mouth 2 (two) times daily.  .  mirtazapine (REMERON) 15 MG tablet Take 15 mg by mouth at bedtime.   . ondansetron (ZOFRAN) 4 MG tablet Take 1 tablet (4 mg total) by mouth every 6 (six) hours as needed for nausea.  . OXYGEN Inhale 2 L into the lungs continuous. To keep sat above 90%  . polyethylene glycol powder (GLYCOLAX/MIRALAX) powder Take 17 g by mouth daily as needed for mild constipation.   Marland Kitchen PRESCRIPTION MEDICATION Take 1 application by mouth daily. Magic Cup at dinner  . Sodium Fluoride (PREVIDENT 5000 BOOSTER PLUS) 1.1 % PSTE Place 1 application onto teeth every morning. Brush on to teeth as directed  . ZINC OXIDE PO Take 1 application by mouth See admin instructions. After every incontinent episode and as needed   No facility-administered encounter medications on file as of 04/12/2019.     Review of Systems  Unable to perform ROS: Other (additional information provided by facility Nurse )    Immunization History  Administered Date(s) Administered  . Influenza Split 10/17/2011, 09/14/2012  . Influenza Whole 10/17/2007  .  Influenza, High Dose Seasonal PF 10/11/2013, 10/05/2014  . Influenza,inj,Quad PF,6+ Mos 10/05/2018  . Influenza,inj,quad, With Preservative 09/15/2018  . Influenza-Unspecified 09/05/2015, 10/02/2016, 10/06/2017  . PPD Test 01/12/2015  . Pneumococcal Conjugate-13 09/29/2014  . Pneumococcal Polysaccharide-23 12/16/1998  . Td 11/10/2018  . Tdap 04/23/2011  . Zoster 07/04/2006   Pertinent  Health Maintenance Due  Topic Date Due  . OPHTHALMOLOGY EXAM  11/15/2014  . FOOT EXAM  11/30/2014  . URINE MICROALBUMIN  11/30/2014  . HEMOGLOBIN A1C  10/11/2015  . INFLUENZA VACCINE  07/17/2019  . DEXA SCAN  Completed  . PNA vac Low Risk Adult  Completed   Fall Risk  10/06/2018 10/05/2018 09/02/2017 07/04/2016 01/04/2016  Falls in the past year? No No No No No    Vitals:   04/12/19 1147  BP: 107/61  Pulse: 85  Resp: 20  Temp: 97.9 F (36.6 C)  TempSrc: Oral  SpO2: 98%  Weight: 105 lb (47.6  kg)  Height: 4\' 11"  (1.499 m)   Body mass index is 21.21 kg/m. Physical Exam Vitals signs and nursing note reviewed.  Constitutional:      General: She is not in acute distress.    Appearance: She is not ill-appearing.  HENT:     Head: Normocephalic.     Nose: No congestion or rhinorrhea.     Mouth/Throat:     Mouth: Mucous membranes are moist.     Pharynx: Oropharynx is clear. No oropharyngeal exudate or posterior oropharyngeal erythema.  Eyes:     General: No scleral icterus.       Right eye: No discharge.        Left eye: No discharge.     Conjunctiva/sclera: Conjunctivae normal.     Pupils: Pupils are equal, round, and reactive to light.  Neck:     Musculoskeletal: Neck supple. No neck rigidity or muscular tenderness.  Cardiovascular:     Rate and Rhythm: Normal rate and regular rhythm.     Pulses: Normal pulses.     Heart sounds: Murmur present. No friction rub. No gallop.   Pulmonary:     Effort: Pulmonary effort is normal. No respiratory distress.     Breath sounds: Normal breath sounds. No wheezing, rhonchi or rales.  Chest:     Chest wall: No tenderness.  Abdominal:     General: Bowel sounds are normal. There is no distension.     Palpations: Abdomen is soft. There is no mass.     Tenderness: There is no abdominal tenderness. There is no right CVA tenderness, left CVA tenderness, guarding or rebound.  Musculoskeletal:        General: No swelling or tenderness.     Right lower leg: No edema.     Left lower leg: No edema.     Comments: Requires assistance with transfers.right knee surgical incision completely healed.right leg immobilizer in place.   Lymphadenopathy:     Cervical: No cervical adenopathy.  Skin:    General: Skin is warm and dry.     Coloration: Skin is not pale.     Findings: No bruising, erythema or rash.  Neurological:     Mental Status: She is alert. She is confused.     Motor: No weakness.     Gait: Gait abnormal.  Psychiatric:         Mood and Affect: Mood normal.        Speech: Speech normal.        Behavior: Behavior is agitated.  Thought Content: Thought content normal.        Cognition and Memory: Memory is impaired.        Judgment: Judgment normal.     Labs reviewed: Recent Labs    09/24/18 0330 09/25/18 0539  11/11/18 0503  02/22/19 1504 02/23/19 0336 02/24/19 0539 03/04/19  NA 142 141   < > 142   < > 141 139 138 142  K 4.7 4.6   < > 5.1   < > 4.4 5.3* 4.3 4.5  CL 109 105   < > 111   < > 110 110 109  --   CO2 28 30   < > 21*   < > 23 22 23   --   GLUCOSE 101* 89   < > 115*   < > 100* 138* 110*  --   BUN 19 14   < > 42*   < > 36* 32* 24* 21  CREATININE 0.87 0.86   < > 1.05*   < > 1.24* 1.05* 0.91 0.8  CALCIUM 8.7* 8.8*   < > 8.8*   < > 9.5 9.2 9.0  --   MG 1.5* 1.7  --  1.7  --   --   --   --   --    < > = values in this interval not displayed.   Recent Labs    09/17/18 0445 09/20/18 0706 09/21/18 0645 02/05/19  AST 43* 17 20 12*  ALT 61* 31 25 8   ALKPHOS 97 72 64 132*  BILITOT 1.0 1.1 0.6  --   PROT 5.2* 4.9* 5.2*  --   ALBUMIN 2.3* 2.2* 2.2*  --    Recent Labs    11/09/18 1424  11/19/18 1006 12/17/18 1058  02/22/19 1504 02/23/19 0336 02/24/19 0539 02/25/19 03/04/19 03/18/19  WBC 12.5*   < > 7.3 9.0   < > 6.0 8.0 7.2 8.1 3.6 7.3  NEUTROABS 8.5*  --  4.1 6.0  --   --   --   --   --   --   --   HGB 10.3*   < > 9.7* 11.6*   < > 10.3* 8.2* 7.5* 8.2* 7.9* 9.5*  HCT 34.1*   < > 30.9* 37.9   < > 34.7* 27.2* 24.6* 25* 25* 29*  MCV 91.2   < > 92.8 88.3  --  89.7 88.9 89.1  --   --   --   PLT 283   < > 290 278   < > 281 235 234 295 559* 400*   < > = values in this interval not displayed.   Lab Results  Component Value Date   TSH 2.31 03/04/2019   Lab Results  Component Value Date   HGBA1C 5.6 04/11/2015   Lab Results  Component Value Date   CHOL 147 04/22/2017   HDL 47 04/22/2017   LDLCALC 77 04/22/2017   LDLDIRECT 131.2 11/30/2013   TRIG 125 04/22/2017   CHOLHDL 4  11/30/2013    Significant Diagnostic Results in last 30 days:  No results found.  Assessment/Plan  1.  Leukocytosis, unspecified type Afebrile.WBC 26.8 Levaquin 500 mg tablet daily x 7 days ordered by MD on call along Florastor 250 mg capsule, U/A and C/S to rule out UTI.Urine analysis and C/S results still pending. Continue current medication.continue to encourage fluid intake and check vital signs every shift.Has Tylenol PRN for fever. Recheck CBC/diff and BMP 4/ 30/2020   2. Decreased oral  intake  Refuses meals per nurse POA has been made aware request hospice service.Awaiting Hospice service evaluation.Has a DNR/MOST for in place. Continue to monitor.  Family/ staff Communication: Reviewed plan of care with patient and facility Nurse supervisor  Labs/tests ordered: CBC/diff and BMP 4/ 30/2020   Sandrea [facility], NP

## 2019-04-14 ENCOUNTER — Non-Acute Institutional Stay (SKILLED_NURSING_FACILITY): Payer: Medicare Other | Admitting: Internal Medicine

## 2019-04-14 ENCOUNTER — Encounter: Payer: Self-pay | Admitting: Internal Medicine

## 2019-04-14 DIAGNOSIS — Z8781 Personal history of (healed) traumatic fracture: Secondary | ICD-10-CM

## 2019-04-14 DIAGNOSIS — D72825 Bandemia: Secondary | ICD-10-CM

## 2019-04-14 DIAGNOSIS — T148XXA Other injury of unspecified body region, initial encounter: Secondary | ICD-10-CM

## 2019-04-14 DIAGNOSIS — R634 Abnormal weight loss: Secondary | ICD-10-CM | POA: Diagnosis not present

## 2019-04-14 DIAGNOSIS — L089 Local infection of the skin and subcutaneous tissue, unspecified: Secondary | ICD-10-CM

## 2019-04-14 DIAGNOSIS — Z9889 Other specified postprocedural states: Secondary | ICD-10-CM

## 2019-04-14 NOTE — Progress Notes (Signed)
Location:  [facility] Room Number: 26 Place of Service:  SNF (31) Provider:Zailyn Thoennes L.MD   Virgie Dad, MD  Patient Care Team: Virgie Dad, MD as PCP - General (Internal Medicine) Milus Banister, MD as Consulting Physician (Gastroenterology) Heath Lark, MD as Consulting Physician (Hematology and Oncology) Mast, Man X, NP as Nurse Practitioner (Internal Medicine)  Extended Emergency Contact Information Primary Emergency Contact: [facility] Address: [place], [place] [address] [name] of [place] Phone: [phone] Mobile Phone: [phone] Relation: Son Secondary Emergency Contact: [name],[name] Address: [address] [place] [address] [place] Phone: [phone] Relation: Daughter   Code Status:DNR  Goals of care: Advanced Directive information Advanced Directives 04/14/2019  Does Patient Have a Medical Advance Directive? Yes  Type of Paramedic of [place];Out of facility DNR (pink MOST or yellow form)  Does patient want to make changes to medical advance directive? No - Patient declined  Copy of [place] in Chart? Yes - validated most recent copy scanned in chart (See row information)  Would patient like information on creating a medical advance directive? -  Pre-existing out of facility DNR order (yellow form or pink MOST form) Yellow form placed in chart (order not valid for inpatient use)     Chief Complaint  Patient presents with  . Acute Visit    follow up wound care     HPI:  Pt is a 85 y.o. female seen today for an acute visit for possible wound infection  Patient has h/o right distal periprostatic femur fracture status post ORIF in 11/19, UTI with ESBL, anemia, myelodysplastic syndrome, dementia, hypertension, hypothyroidism  Patient initially had ORIF in Nov .  But she continued to have malunion of the  fracture.  It was decided due to the do the revision in 03/20 Patient had uncomplicated postop. And her Wound closed completely. Over the weekend patient spiked a temp of 102 . Her Bells count was 26.8. Her family did not want her to go to hospital. She did not have any cough or sob. Per nurses her Urine seems Infected but it is hard to know with her. I had started her Empirically on Levaquin.  Since then she did not spike any fever. And was eating little better. But today Nurses Noticed a small Opening in her Right leg near her Surgical site. And it is draining. She was very Tender to touch. It was warm and swollen around that area. Her son wants her to be comfort care with Hospice consult Pending. Patient would wince in pain when we tried to move her Right Leg. No More history could be obtained    Past Medical History:  Diagnosis Date  . Blood transfusion 2006  . Colon polyps   . CVA (cerebral infarction) 2013  . Dementia ([place])    vascular dementia   . Diverticulosis of colon   . DJD (degenerative joint disease)   . Hyperlipidemia   . Hypertension   . Hypothyroidism   . Melanoma ([place]) 03/20/15   removed with Moh's surgery  . Myelodysplasia 02/04/2012  . Normochromic normocytic anemia 02/04/2012  . Onychomycosis   . Osteoporosis   . Oxygen dependent    2l/[place] continuous   . Rectal prolapse   . Shoulder pain   . Unstable gait    Past  Surgical History:  Procedure Laterality Date  . APPENDECTOMY    . CARPAL TUNNEL RELEASE Bilateral 2006  . FEMUR IM NAIL Right 11/09/2018   Procedure: INTRAMEDULLARY (IM) RETROGRADE FEMORAL NAILING;  Surgeon: Paralee Cancel, MD;  [facility];  Service: Orthopedics;  Laterality: Right;  . FLEXIBLE SIGMOIDOSCOPY  07/09/2012   Procedure: FLEXIBLE SIGMOIDOSCOPY;  Surgeon: Inda Castle, MD;  [facility];  Service: Endoscopy;  Laterality: N/A;  . INCISION AND DRAINAGE PERIRECTAL ABSCESS N/A 09/21/2018   Procedure: IRRIGATION AND  DEBRIDEMENT PERIRECTAL ABSCESS;  Surgeon: Rolm Bookbinder, MD;  Location: [facility];  Service: General;  Laterality: N/A;  . MELANOMA EXCISION Right 2005   arm  . ORIF FEMUR FRACTURE Right 02/22/2019   Procedure: REVISION OPEN REDUCTION INTERNAL FIXATION (ORIF) DISTAL FEMUR FRACTURE;  Surgeon: Paralee Cancel, MD;  [facility];  Service: Orthopedics;  Laterality: Right;  90 mins Alvan Dame can start at 3 or 3:30 if time opens up.  Marland Kitchen POLYPECTOMY  2004   Laparoscopic  . RECTOCELE REPAIR    . REPLACEMENT TOTAL KNEE  2005 & 2006  . VAGINAL HYSTERECTOMY  1989    Allergies  Allergen Reactions  . Lisinopril Swelling  . Penicillins Rash    Has patient had a PCN reaction causing immediate rash, facial/tongue/throat swelling, SOB or lightheadedness with hypotension: Unknown Has patient had a PCN reaction causing severe rash involving mucus membranes or skin necrosis: Unknown Has patient had a PCN reaction that required hospitalization: Unknown Has patient had a PCN reaction occurring within the last 10 years: Unknown If all of the above answers are "NO", then may proceed with Cephalosporin use.    Outpatient Encounter Medications as of 04/14/2019  Medication Sig  . aspirin 81 MG chewable tablet Chew 81 mg by mouth daily.  . cycloSPORINE (RESTASIS) 0.05 % ophthalmic emulsion Place 1 drop into both eyes 2 (two) times daily.    Marland Kitchen docusate sodium (COLACE) 100 MG capsule Take 1 capsule (100 mg total) by mouth 2 (two) times daily.  Marland Kitchen donepezil (ARICEPT) 5 MG tablet Take 5 mg by mouth at bedtime.  . ferrous sulfate 325 (65 FE) MG tablet Take 1 tablet (325 mg total) by mouth 3 (three) times daily after meals.  Marland Kitchen HYDROcodone-acetaminophen (NORCO) 7.5-325 MG tablet Take 1-2 tablets by mouth every 4 (four) hours as needed for severe pain (pain score 7-10).  . hydroxypropyl methylcellulose / hypromellose (ISOPTO TEARS / GONIOVISC) 2.5 % ophthalmic solution Place 1 drop into both eyes every 2 (two) hours as needed  for dry eyes.  Marland Kitchen lactose free nutrition (BOOST) LIQD Take 237 mLs by mouth 2 (two) times daily.  Marland Kitchen levofloxacin (LEVAQUIN) 500 MG tablet Take 500 mg by mouth daily.  Marland Kitchen levothyroxine (SYNTHROID, LEVOTHROID) 88 MCG tablet Take 1 tablet (88 mcg total) by mouth daily before breakfast.  . Metoprolol Tartrate 37.5 MG TABS Take 37.5 mg by mouth 2 (two) times daily.  . mirtazapine (REMERON) 15 MG tablet Take 15 mg by mouth at bedtime.   . ondansetron (ZOFRAN) 4 MG tablet Take 1 tablet (4 mg total) by mouth every 6 (six) hours as needed for nausea.  . OXYGEN Inhale 2 L into the lungs continuous. To keep sat above 90%  . polyethylene glycol powder (GLYCOLAX/MIRALAX) powder Take 17 g by mouth daily as needed for mild constipation.   Marland Kitchen PRESCRIPTION MEDICATION Take 1 application by mouth daily. Magic Cup at dinner  . saccharomyces boulardii (FLORASTOR) 250 MG capsule Take 250 mg by  mouth 2 (two) times daily.  . Sodium Fluoride (PREVIDENT 5000 BOOSTER PLUS) 1.1 % PSTE Place 1 application onto teeth every morning. Brush on to teeth as directed  . ZINC OXIDE PO Take 1 application by mouth See admin instructions. After every incontinent episode and as needed   No facility-administered encounter medications on file as of 04/14/2019.     Review of Systems  Unable to perform ROS: Dementia    Immunization History  Administered Date(s) Administered  . Influenza Split 10/17/2011, 09/14/2012  . Influenza Whole 10/17/2007  . Influenza, High Dose Seasonal PF 10/11/2013, 10/05/2014  . Influenza,inj,Quad PF,6+ Mos 10/05/2018  . Influenza,inj,quad, With Preservative 09/15/2018  . Influenza-Unspecified 09/05/2015, 10/02/2016, 10/06/2017  . PPD Test 01/12/2015  . Pneumococcal Conjugate-13 09/29/2014  . Pneumococcal Polysaccharide-23 12/16/1998  . Td 11/10/2018  . Tdap 04/23/2011  . Zoster 07/04/2006   Pertinent  Health Maintenance Due  Topic Date Due  . OPHTHALMOLOGY EXAM  11/15/2014  . FOOT EXAM  11/30/2014   . URINE MICROALBUMIN  11/30/2014  . HEMOGLOBIN A1C  10/11/2015  . INFLUENZA VACCINE  07/17/2019  . DEXA SCAN  Completed  . PNA vac Low Risk Adult  Completed   Fall Risk  10/06/2018 10/05/2018 09/02/2017 07/04/2016 01/04/2016  Falls in the past year? No No No No No   Functional Status Survey:    Vitals:   04/14/19 1025  BP: (!) 97/50  Pulse: (!) 56  Resp: 14  Temp: 98.1 F (36.7 C)  SpO2: 91%  Weight: 105 lb (47.6 kg)  Height: 4\' 11"  (1.499 m)   Body mass index is 21.21 kg/m. Physical Exam Vitals signs reviewed.  Constitutional:      Appearance: Normal appearance.  HENT:     Head: Normocephalic.     Nose: Nose normal.     Mouth/Throat:     Mouth: Mucous membranes are moist.     Pharynx: Oropharynx is clear.  Eyes:     Pupils: Pupils are equal, round, and reactive to light.  Neck:     Musculoskeletal: Neck supple.  Cardiovascular:     Rate and Rhythm: Normal rate and regular rhythm.     Pulses: Normal pulses.     Heart sounds: Normal heart sounds.  Pulmonary:     Effort: Pulmonary effort is normal. No respiratory distress.     Breath sounds: Normal breath sounds. No wheezing.  Abdominal:     General: Abdomen is flat. Bowel sounds are normal.     Palpations: Abdomen is soft.  Musculoskeletal:     Comments: Right Knee Swollen With Discharge from Surgical Site . It is warm and very Tender  Skin:    General: Skin is warm.  Neurological:     Mental Status: She is alert.     Comments: Seem in Pain. Would Not follow any commands  Psychiatric:        Mood and Affect: Mood normal.        Thought Content: Thought content normal.     Labs reviewed: Recent Labs    09/24/18 0330 09/25/18 0539  11/11/18 0503  02/22/19 1504 02/23/19 0336 02/24/19 0539 03/04/19  NA 142 141   < > 142   < > 141 139 138 142  K 4.7 4.6   < > 5.1   < > 4.4 5.3* 4.3 4.5  CL 109 105   < > 111   < > 110 110 109  --   CO2 28 30   < > 21*   < >  23 22 23   --   GLUCOSE 101* 89   < > 115*    < > 100* 138* 110*  --   BUN 19 14   < > 42*   < > 36* 32* 24* 21  CREATININE 0.87 0.86   < > 1.05*   < > 1.24* 1.05* 0.91 0.8  CALCIUM 8.7* 8.8*   < > 8.8*   < > 9.5 9.2 9.0  --   MG 1.5* 1.7  --  1.7  --   --   --   --   --    < > = values in this interval not displayed.   Recent Labs    09/17/18 0445 09/20/18 0706 09/21/18 0645 02/05/19  AST 43* 17 20 12*  ALT 61* 31 25 8   ALKPHOS 97 72 64 132*  BILITOT 1.0 1.1 0.6  --   PROT 5.2* 4.9* 5.2*  --   ALBUMIN 2.3* 2.2* 2.2*  --    Recent Labs    11/09/18 1424  11/19/18 1006 12/17/18 1058  02/22/19 1504 02/23/19 0336 02/24/19 0539 02/25/19 03/04/19 03/18/19  WBC 12.5*   < > 7.3 9.0   < > 6.0 8.0 7.2 8.1 3.6 7.3  NEUTROABS 8.5*  --  4.1 6.0  --   --   --   --   --   --   --   HGB 10.3*   < > 9.7* 11.6*   < > 10.3* 8.2* 7.5* 8.2* 7.9* 9.5*  HCT 34.1*   < > 30.9* 37.9   < > 34.7* 27.2* 24.6* 25* 25* 29*  MCV 91.2   < > 92.8 88.3  --  89.7 88.9 89.1  --   --   --   PLT 283   < > 290 278   < > 281 235 234 295 559* 400*   < > = values in this interval not displayed.   Lab Results  Component Value Date   TSH 2.31 03/04/2019   Lab Results  Component Value Date   HGBA1C 5.6 04/11/2015   Lab Results  Component Value Date   CHOL 147 04/22/2017   HDL 47 04/22/2017   LDLCALC 77 04/22/2017   LDLDIRECT 131.2 11/30/2013   TRIG 125 04/22/2017   CHOLHDL 4 11/30/2013    Significant Diagnostic Results in last 30 days:  No results found.  Assessment/Plan  Patient with Fever , Leucocytosis and now Tender Area in Post Surgical Site with Drainage. Low BP  I Am worried it her Prosthetic area which is draining  is infected. At this time d/w the son and he wants her under hospice care Will continue Levaquin for 14 days Start her on Roxanol 5mg  Q 4 PRN for Pain Discontinue Norco Decrease her Metoprolol to 12.5 mg BID Discontinue Aricept Will continue Remeron and Synthroid for now Decrease iron to QD     Family/ staff  Communication:   Labs/tests ordered:

## 2019-04-15 ENCOUNTER — Non-Acute Institutional Stay (SKILLED_NURSING_FACILITY): Payer: Medicare Other | Admitting: Family

## 2019-04-15 ENCOUNTER — Encounter: Payer: Self-pay | Admitting: Family

## 2019-04-15 DIAGNOSIS — Z8781 Personal history of (healed) traumatic fracture: Secondary | ICD-10-CM | POA: Diagnosis not present

## 2019-04-15 DIAGNOSIS — D509 Iron deficiency anemia, unspecified: Secondary | ICD-10-CM

## 2019-04-15 DIAGNOSIS — Z9889 Other specified postprocedural states: Secondary | ICD-10-CM

## 2019-04-15 DIAGNOSIS — E875 Hyperkalemia: Secondary | ICD-10-CM | POA: Diagnosis not present

## 2019-04-15 NOTE — Progress Notes (Signed)
Location:  [place] Room Number: 26A Place of Service:  SNF [phone]) Provider: Ladajah Soltys FNP-C  Virgie Dad, MD  Patient Care Team: Virgie Dad, MD as PCP - General (Internal Medicine) Milus Banister, MD as Consulting Physician (Gastroenterology) Heath Lark, MD as Consulting Physician (Hematology and Oncology) Mast, Man X, NP as Nurse Practitioner (Internal Medicine)  Extended Emergency Contact Information Primary Emergency Contact: [facility] Address: [place], [place] [address] [name] Phone: [phone] Mobile Phone: [phone] Relation: Son Secondary Emergency Contact: [name],[name] Address: [address] [place] [address] [place] Phone: [phone] Relation: Daughter  Code Status:  DNR Goals of care: Advanced Directive information Advanced Directives 04/15/2019  Does Patient Have a Medical Advance Directive? Yes  Type of Paramedic of [place];Living will;Out of facility DNR (pink MOST or yellow form)  Does patient want to make changes to medical advance directive? No - Patient declined  Copy of [place] in Chart? Yes - validated most recent copy scanned in chart (See row information)  Would patient like information on creating a medical advance directive? -  Pre-existing out of facility DNR order (yellow form or pink MOST form) Yellow form placed in chart (order not valid for inpatient use)     Chief Complaint  Patient presents with   Acute Visit    Abnormal Labs    HPI:  Pt is a 85 y.o. female seen today at [facility] for an acute visit for evaluation of abnormal lab results.she is seen in her room asleep in bed.she is easily awaken responds to questions then falls back to sleep.Her lab resulted K+ 5.7,CR 1.18,BUN 41,Hgb 8.0 HCT 24.4.Her WBC has improved 5.2  Previous WBC was 26.8 she is  currently on Levaquin 500 mg tablet daily.No fever,chills or cough reported. Nurse states patient has been unco-operative and combative at times unable to collect previous ordered urine specimen for U/A and C/S.Nurse also states patient in pain whenever repositioned.Roxanol 0.25 cc ( 5 mg) oral solution every 4 hrs as needed initiated previous day by MD.she has a small open area reported yesterday by Nurse on right knee previous incision site draining.     Past Medical History:  Diagnosis Date   Blood transfusion 2006   Colon polyps    CVA (cerebral infarction) 2013   Dementia [facility])    vascular dementia    Diverticulosis of colon    DJD (degenerative joint disease)    Hyperlipidemia    Hypertension    Hypothyroidism    Melanoma ([place]) 03/20/15   removed with Moh's surgery   Myelodysplasia 02/04/2012   Normochromic normocytic anemia 02/04/2012   Onychomycosis    Osteoporosis    Oxygen dependent    2l/[place] continuous    Rectal prolapse    Shoulder pain    Unstable gait    Past Surgical History:  Procedure Laterality Date   APPENDECTOMY     CARPAL TUNNEL RELEASE Bilateral 2006   FEMUR IM NAIL Right 11/09/2018   Procedure: INTRAMEDULLARY (IM) RETROGRADE FEMORAL NAILING;  Surgeon: Paralee Cancel, MD;  Location: [facility];  Service: Orthopedics;  Laterality: Right;   FLEXIBLE SIGMOIDOSCOPY  07/09/2012   Procedure: FLEXIBLE SIGMOIDOSCOPY;  Surgeon: Inda Castle, MD;  Location: [facility];  Service: Endoscopy;  Laterality: N/A;   INCISION  AND DRAINAGE PERIRECTAL ABSCESS N/A 09/21/2018   Procedure: IRRIGATION AND DEBRIDEMENT PERIRECTAL ABSCESS;  Surgeon: Rolm Bookbinder, MD;  Location: [place];  Service: General;  Laterality: N/A;   MELANOMA EXCISION Right 2005   arm   ORIF FEMUR FRACTURE Right 02/22/2019   Procedure: REVISION OPEN REDUCTION INTERNAL FIXATION (ORIF) DISTAL FEMUR FRACTURE;  Surgeon: Paralee Cancel, MD;  Location: [facility];  Service: Orthopedics;   Laterality: Right;  90 mins Alvan Dame can start at 3 or 3:30 if time opens up.   POLYPECTOMY  2004   Laparoscopic   RECTOCELE REPAIR     REPLACEMENT TOTAL KNEE  2005 & 2006   VAGINAL HYSTERECTOMY  1989    Allergies  Allergen Reactions   Lisinopril Swelling   Penicillins Rash    Has patient had a PCN reaction causing immediate rash, facial/tongue/throat swelling, SOB or lightheadedness with hypotension: Unknown Has patient had a PCN reaction causing severe rash involving mucus membranes or skin necrosis: Unknown Has patient had a PCN reaction that required hospitalization: Unknown Has patient had a PCN reaction occurring within the last 10 years: Unknown If all of the above answers are "NO", then may proceed with Cephalosporin use.    Outpatient Encounter Medications as of 04/15/2019  Medication Sig   aspirin 81 MG chewable tablet Chew 81 mg by mouth daily.   cycloSPORINE (RESTASIS) 0.05 % ophthalmic emulsion Place 1 drop into both eyes 2 (two) times daily.     docusate sodium (COLACE) 100 MG capsule Take 1 capsule (100 mg total) by mouth 2 (two) times daily.   ferrous sulfate 325 (65 FE) MG tablet Take 1 tablet (325 mg total) by mouth 3 (three) times daily after meals.   hydroxypropyl methylcellulose / hypromellose (ISOPTO TEARS / GONIOVISC) 2.5 % ophthalmic solution Place 1 drop into both eyes every 2 (two) hours as needed for dry eyes.   levofloxacin (LEVAQUIN) 500 MG tablet Take 500 mg by mouth daily.   levothyroxine (SYNTHROID, LEVOTHROID) 88 MCG tablet Take 1 tablet (88 mcg total) by mouth daily before breakfast.   metoprolol succinate (TOPROL-XL) 25 MG 24 hr tablet Take 12.5 mg by mouth daily.   mirtazapine (REMERON) 15 MG tablet Take 15 mg by mouth at bedtime.    morphine 20 MG/5ML solution Take 5 mg by mouth every 6 (six) hours as needed for pain.   ondansetron (ZOFRAN) 4 MG tablet Take 1 tablet (4 mg total) by mouth every 6 (six) hours as needed for nausea.    OXYGEN Inhale 2 L into the lungs continuous. To keep sat above 90%   polyethylene glycol powder (GLYCOLAX/MIRALAX) powder Take 17 g by mouth daily as needed for mild constipation.    PRESCRIPTION MEDICATION Take 1 application by mouth daily. Magic Cup at dinner   saccharomyces boulardii (FLORASTOR) 250 MG capsule Take 250 mg by mouth 2 (two) times daily.   Sodium Fluoride (PREVIDENT 5000 BOOSTER PLUS) 1.1 % PSTE Place 1 application onto teeth every morning. Brush on to teeth as directed   ZINC OXIDE PO Take 1 application by mouth See admin instructions. After every incontinent episode and as needed   [DISCONTINUED] donepezil (ARICEPT) 5 MG tablet Take 5 mg by mouth at bedtime.   [DISCONTINUED] HYDROcodone-acetaminophen (NORCO) 7.5-325 MG tablet Take 1-2 tablets by mouth every 4 (four) hours as needed for severe pain (pain score 7-10).   [DISCONTINUED] lactose free nutrition (BOOST) LIQD Take 237 mLs by mouth 2 (two) times daily.   [DISCONTINUED] Metoprolol Tartrate 37.5 MG  TABS Take 37.5 mg by mouth 2 (two) times daily.   No facility-administered encounter medications on file as of 04/15/2019.     Review of Systems  Unable to perform ROS: Other (additional information provided by facility Nurse patient falls asleep in mid-sentence. )   Immunization History  Administered Date(s) Administered   Influenza Split 10/17/2011, 09/14/2012   Influenza Whole 10/17/2007   Influenza, High Dose Seasonal PF 10/11/2013, 10/05/2014   Influenza,inj,Quad PF,6+ Mos 10/05/2018   Influenza,inj,quad, With Preservative 09/15/2018   Influenza-Unspecified 09/05/2015, 10/02/2016, 10/06/2017   PPD Test 01/12/2015   Pneumococcal Conjugate-13 09/29/2014   Pneumococcal Polysaccharide-23 12/16/1998   Td 11/10/2018   Tdap 04/23/2011   Zoster 07/04/2006   Pertinent  Health Maintenance Due  Topic Date Due   OPHTHALMOLOGY EXAM  11/15/2014   FOOT EXAM  11/30/2014   URINE MICROALBUMIN   11/30/2014   HEMOGLOBIN A1C  10/11/2015   INFLUENZA VACCINE  07/17/2019   DEXA SCAN  Completed   PNA vac Low Risk Adult  Completed   Fall Risk  10/06/2018 10/05/2018 09/02/2017 07/04/2016 01/04/2016  Falls in the past year? No No No No No   Functional Status Survey:    Vitals:   04/15/19 1448  BP: 125/70  Pulse: (!) 116  Resp: 14  Temp: 98.4 F (36.9 C)  TempSrc: Oral  SpO2: 93%  Weight: 105 lb 6.4 oz (47.8 kg)  Height: 4\' 11"  (1.499 m)   Body mass index is 21.29 kg/m. Physical Exam Vitals signs and nursing note reviewed.  Constitutional:      General: She is not in acute distress. HENT:     Head: Normocephalic.     Right Ear: Tympanic membrane, ear canal and external ear normal. There is no impacted cerumen.     Left Ear: Tympanic membrane, ear canal and external ear normal. There is no impacted cerumen.     Nose: Nose normal. No congestion or rhinorrhea.     Mouth/Throat:     Mouth: Mucous membranes are moist.     Pharynx: Oropharynx is clear. No oropharyngeal exudate or posterior oropharyngeal erythema.  Eyes:     General: No scleral icterus.       Right eye: No discharge.        Left eye: No discharge.     Conjunctiva/sclera: Conjunctivae normal.     Pupils: Pupils are equal, round, and reactive to light.  Neck:     Musculoskeletal: Normal range of motion. No neck rigidity or muscular tenderness.     Vascular: No carotid bruit.  Cardiovascular:     Rate and Rhythm: Normal rate and regular rhythm.     Pulses: Normal pulses.     Heart sounds: Murmur present. No friction rub. No gallop.   Pulmonary:     Effort: Pulmonary effort is normal. No respiratory distress.     Breath sounds: Normal breath sounds. No wheezing, rhonchi or rales.  Chest:     Chest wall: No tenderness.  Abdominal:     General: Bowel sounds are normal. There is no distension.     Palpations: Abdomen is soft. There is no mass.     Tenderness: There is no abdominal tenderness. There is no  right CVA tenderness, left CVA tenderness, guarding or rebound.  Musculoskeletal:     Right lower leg: No edema.     Left lower leg: No edema.     Comments: Moves x 4 extremities except limited ROM right leg due to pain.   Lymphadenopathy:  Cervical: No cervical adenopathy.  Skin:    General: Skin is warm and dry.     Coloration: Skin is not pale.     Findings: No erythema or rash.     Comments: Right knee surgical incision site with small open area draining yellow drainage.Knee tender to touch no redness noted.the rest of the incision site dry,clean and intact.   Neurological:     Mental Status: She is alert and easily aroused. She is confused.  Psychiatric:        Mood and Affect: Mood normal.        Speech: Speech normal.        Behavior: Behavior is uncooperative.    Labs reviewed: Recent Labs    09/24/18 0330 09/25/18 0539  11/11/18 0503  02/22/19 1504 02/23/19 0336 02/24/19 0539 03/04/19 04/11/19  NA 142 141   < > 142   < > 141 139 138 142 139  K 4.7 4.6   < > 5.1   < > 4.4 5.3* 4.3 4.5 5.4*  CL 109 105   < > 111   < > 110 110 109  --   --   CO2 28 30   < > 21*   < > 23 22 23   --   --   GLUCOSE 101* 89   < > 115*   < > 100* 138* 110*  --   --   BUN 19 14   < > 42*   < > 36* 32* 24* 21 17  CREATININE 0.87 0.86   < > 1.05*   < > 1.24* 1.05* 0.91 0.8 1.9*  CALCIUM 8.7* 8.8*   < > 8.8*   < > 9.5 9.2 9.0  --   --   MG 1.5* 1.7  --  1.7  --   --   --   --   --   --    < > = values in this interval not displayed.   Recent Labs    09/17/18 0445 09/20/18 0706 09/21/18 0645 02/05/19  AST 43* 17 20 12*  ALT 61* 31 25 8   ALKPHOS 97 72 64 132*  BILITOT 1.0 1.1 0.6  --   PROT 5.2* 4.9* 5.2*  --   ALBUMIN 2.3* 2.2* 2.2*  --    Recent Labs    11/09/18 1424  11/19/18 1006 12/17/18 1058  02/22/19 1504 02/23/19 0336 02/24/19 0539  03/04/19 03/18/19 04/11/19  WBC 12.5*   < > 7.3 9.0   < > 6.0 8.0 7.2   < > 3.6 7.3 26.8  NEUTROABS 8.5*  --  4.1 6.0  --   --   --   --    --   --   --   --   HGB 10.3*   < > 9.7* 11.6*   < > 10.3* 8.2* 7.5*   < > 7.9* 9.5* 8.8*  HCT 34.1*   < > 30.9* 37.9   < > 34.7* 27.2* 24.6*   < > 25* 29* 27*  MCV 91.2   < > 92.8 88.3  --  89.7 88.9 89.1  --   --   --   --   PLT 283   < > 290 278   < > 281 235 234   < > 559* 400* 455*   < > = values in this interval not displayed.   Lab Results  Component Value Date   TSH 2.31 03/04/2019   Lab  Results  Component Value Date   HGBA1C 5.6 04/11/2015   Lab Results  Component Value Date   CHOL 147 04/22/2017   HDL 47 04/22/2017   LDLCALC 77 04/22/2017   LDLDIRECT 131.2 11/30/2013   TRIG 125 04/22/2017   CHOLHDL 4 11/30/2013    Significant Diagnostic Results in last 30 days:  No results found.  Assessment/Plan 1. Hyperkalemia K+ 5.7 (04/15/2019).will order Kayexalate 15 Gm x 1 dose today.Recheck BMP 04/16/2019.    2. Iron deficiency anemia, unspecified iron deficiency anemia type Hgb 8.0 previous 8.8 continue on ferrous sulfate 325 mg tablet.  3. S/P ORIF right distal femur fracture Afebrile.Has a small open area on right knee surgical incision draining yellow drainage and knee tender to touch.No redness noted.[facility] has been Notified by Nurse new order for X-ray for follow up ORIF.continue on current oral antibiotics and monitor Temp curve. - change Roxanol 20 mg /ml oral solution from 0.25 cc every 4 hours as needed to 0.25 cc every 8 hours scheduled patient unable to request medication.also will add Roxanol  20 mg /ml oral solution from 0.25 cc every 6 hours as needed.Hospice service to continue to follow up. Patient's son notified of change of medication by Nurse supervisor.   Family/ staff Communication: Reviewed plan of care with patient and facility Nurse supervisor  Labs/tests ordered: BMP 04/16/2019.     Sandrea Hughs, NP

## 2019-04-19 ENCOUNTER — Other Ambulatory Visit: Payer: Self-pay | Admitting: *Deleted

## 2019-05-17 DEATH — deceased
